# Patient Record
Sex: Female | Born: 1980
Health system: Southern US, Community
[De-identification: ages and names within clinical notes are randomized; demographics above are authoritative.]

## PROBLEM LIST (undated history)

## (undated) ENCOUNTER — Ambulatory Visit: Discharge status: Absent without leave

## (undated) DIAGNOSIS — J45909 Unspecified asthma, uncomplicated: Secondary | ICD-10-CM

## (undated) DIAGNOSIS — K219 Gastro-esophageal reflux disease without esophagitis: Secondary | ICD-10-CM

## (undated) DIAGNOSIS — E119 Type 2 diabetes mellitus without complications: Secondary | ICD-10-CM

## (undated) DIAGNOSIS — O039 Complete or unspecified spontaneous abortion without complication: Secondary | ICD-10-CM

## (undated) DIAGNOSIS — I10 Essential (primary) hypertension: Secondary | ICD-10-CM

## (undated) DIAGNOSIS — F4310 Post-traumatic stress disorder, unspecified: Secondary | ICD-10-CM

## (undated) DIAGNOSIS — R000 Tachycardia, unspecified: Secondary | ICD-10-CM

## (undated) DIAGNOSIS — R440 Auditory hallucinations: Secondary | ICD-10-CM

## (undated) DIAGNOSIS — E785 Hyperlipidemia, unspecified: Secondary | ICD-10-CM

## (undated) DIAGNOSIS — R0600 Dyspnea, unspecified: Secondary | ICD-10-CM

## (undated) DIAGNOSIS — F329 Major depressive disorder, single episode, unspecified: Secondary | ICD-10-CM

## (undated) HISTORY — DX: Essential (primary) hypertension: I10

## (undated) HISTORY — PX: OTHER SURGICAL HISTORY: SHX169

## (undated) HISTORY — PX: TONSILLECTOMY: SUR1361

## (undated) HISTORY — DX: Major depressive disorder, single episode, unspecified: F32.9

## (undated) HISTORY — PX: ABDOMINAL HYSTERECTOMY: SHX81

## (undated) HISTORY — PX: OVARY SURGERY: SHX727

## (undated) HISTORY — DX: Post-traumatic stress disorder, unspecified: F43.10

## (undated) HISTORY — PX: ADENOIDECTOMY: SUR15

## (undated) HISTORY — DX: Complete or unspecified spontaneous abortion without complication: O03.9

## (undated) HISTORY — DX: Type 2 diabetes mellitus without complications: E11.9

---

## 1999-02-19 ENCOUNTER — Emergency Department (HOSPITAL_COMMUNITY): Admission: EM | Admit: 1999-02-19 | Discharge: 1999-02-19 | Payer: Self-pay | Admitting: Internal Medicine

## 1999-07-08 ENCOUNTER — Emergency Department (HOSPITAL_COMMUNITY): Admission: EM | Admit: 1999-07-08 | Discharge: 1999-07-08 | Payer: Self-pay | Admitting: Emergency Medicine

## 1999-07-08 ENCOUNTER — Encounter: Payer: Self-pay | Admitting: Emergency Medicine

## 1999-08-22 ENCOUNTER — Emergency Department (HOSPITAL_COMMUNITY): Admission: EM | Admit: 1999-08-22 | Discharge: 1999-08-22 | Payer: Self-pay | Admitting: Emergency Medicine

## 1999-11-25 ENCOUNTER — Emergency Department (HOSPITAL_COMMUNITY): Admission: EM | Admit: 1999-11-25 | Discharge: 1999-11-25 | Payer: Self-pay | Admitting: Emergency Medicine

## 1999-12-22 ENCOUNTER — Encounter: Payer: Self-pay | Admitting: Emergency Medicine

## 1999-12-22 ENCOUNTER — Emergency Department (HOSPITAL_COMMUNITY): Admission: EM | Admit: 1999-12-22 | Discharge: 1999-12-22 | Payer: Self-pay | Admitting: Emergency Medicine

## 2000-06-01 ENCOUNTER — Emergency Department (HOSPITAL_COMMUNITY): Admission: EM | Admit: 2000-06-01 | Discharge: 2000-06-01 | Payer: Self-pay | Admitting: Emergency Medicine

## 2001-02-07 ENCOUNTER — Emergency Department (HOSPITAL_COMMUNITY): Admission: EM | Admit: 2001-02-07 | Discharge: 2001-02-07 | Payer: Self-pay | Admitting: Emergency Medicine

## 2003-08-28 ENCOUNTER — Emergency Department (HOSPITAL_COMMUNITY): Admission: EM | Admit: 2003-08-28 | Discharge: 2003-08-28 | Payer: Self-pay | Admitting: *Deleted

## 2003-09-02 ENCOUNTER — Other Ambulatory Visit: Admission: RE | Admit: 2003-09-02 | Discharge: 2003-09-02 | Payer: Self-pay | Admitting: *Deleted

## 2003-10-09 ENCOUNTER — Emergency Department (HOSPITAL_COMMUNITY): Admission: EM | Admit: 2003-10-09 | Discharge: 2003-10-09 | Payer: Self-pay | Admitting: Emergency Medicine

## 2003-10-23 ENCOUNTER — Emergency Department (HOSPITAL_COMMUNITY): Admission: EM | Admit: 2003-10-23 | Discharge: 2003-10-23 | Payer: Self-pay

## 2004-03-01 ENCOUNTER — Emergency Department (HOSPITAL_COMMUNITY): Admission: EM | Admit: 2004-03-01 | Discharge: 2004-03-01 | Payer: Self-pay | Admitting: Emergency Medicine

## 2004-03-07 ENCOUNTER — Ambulatory Visit (HOSPITAL_COMMUNITY): Admission: RE | Admit: 2004-03-07 | Discharge: 2004-03-07 | Payer: Self-pay | Admitting: *Deleted

## 2004-03-07 ENCOUNTER — Encounter (INDEPENDENT_AMBULATORY_CARE_PROVIDER_SITE_OTHER): Payer: Self-pay | Admitting: *Deleted

## 2004-03-08 ENCOUNTER — Encounter (INDEPENDENT_AMBULATORY_CARE_PROVIDER_SITE_OTHER): Payer: Self-pay | Admitting: *Deleted

## 2004-03-18 ENCOUNTER — Inpatient Hospital Stay (HOSPITAL_COMMUNITY): Admission: AD | Admit: 2004-03-18 | Discharge: 2004-03-18 | Payer: Self-pay | Admitting: Obstetrics and Gynecology

## 2004-03-28 ENCOUNTER — Emergency Department (HOSPITAL_COMMUNITY): Admission: EM | Admit: 2004-03-28 | Discharge: 2004-03-28 | Payer: Self-pay | Admitting: Emergency Medicine

## 2004-05-08 ENCOUNTER — Emergency Department (HOSPITAL_COMMUNITY): Admission: EM | Admit: 2004-05-08 | Discharge: 2004-05-08 | Payer: Self-pay | Admitting: Emergency Medicine

## 2004-06-26 ENCOUNTER — Ambulatory Visit (HOSPITAL_COMMUNITY): Admission: RE | Admit: 2004-06-26 | Discharge: 2004-06-26 | Payer: Self-pay | Admitting: *Deleted

## 2004-08-21 ENCOUNTER — Emergency Department (HOSPITAL_COMMUNITY): Admission: EM | Admit: 2004-08-21 | Discharge: 2004-08-21 | Payer: Self-pay | Admitting: Emergency Medicine

## 2004-08-23 ENCOUNTER — Other Ambulatory Visit: Admission: RE | Admit: 2004-08-23 | Discharge: 2004-08-23 | Payer: Self-pay | Admitting: Obstetrics and Gynecology

## 2004-09-16 ENCOUNTER — Emergency Department (HOSPITAL_COMMUNITY): Admission: EM | Admit: 2004-09-16 | Discharge: 2004-09-16 | Payer: Self-pay | Admitting: Emergency Medicine

## 2005-02-14 ENCOUNTER — Emergency Department (HOSPITAL_COMMUNITY): Admission: EM | Admit: 2005-02-14 | Discharge: 2005-02-14 | Payer: Self-pay | Admitting: *Deleted

## 2005-02-20 ENCOUNTER — Emergency Department (HOSPITAL_COMMUNITY): Admission: EM | Admit: 2005-02-20 | Discharge: 2005-02-20 | Payer: Self-pay | Admitting: Emergency Medicine

## 2005-03-03 ENCOUNTER — Emergency Department (HOSPITAL_COMMUNITY): Admission: EM | Admit: 2005-03-03 | Discharge: 2005-03-04 | Payer: Self-pay | Admitting: *Deleted

## 2005-08-07 ENCOUNTER — Emergency Department (HOSPITAL_COMMUNITY): Admission: EM | Admit: 2005-08-07 | Discharge: 2005-08-07 | Payer: Self-pay | Admitting: Emergency Medicine

## 2005-09-06 ENCOUNTER — Emergency Department (HOSPITAL_COMMUNITY): Admission: EM | Admit: 2005-09-06 | Discharge: 2005-09-06 | Payer: Self-pay | Admitting: Emergency Medicine

## 2005-10-31 ENCOUNTER — Emergency Department (HOSPITAL_COMMUNITY): Admission: EM | Admit: 2005-10-31 | Discharge: 2005-10-31 | Payer: Self-pay | Admitting: Emergency Medicine

## 2005-11-15 ENCOUNTER — Emergency Department (HOSPITAL_COMMUNITY): Admission: EM | Admit: 2005-11-15 | Discharge: 2005-11-15 | Payer: Self-pay | Admitting: Emergency Medicine

## 2006-04-02 ENCOUNTER — Emergency Department (HOSPITAL_COMMUNITY): Admission: EM | Admit: 2006-04-02 | Discharge: 2006-04-02 | Payer: Self-pay | Admitting: Emergency Medicine

## 2006-06-29 ENCOUNTER — Emergency Department (HOSPITAL_COMMUNITY): Admission: EM | Admit: 2006-06-29 | Discharge: 2006-06-29 | Payer: Self-pay | Admitting: Family Medicine

## 2007-03-05 ENCOUNTER — Emergency Department (HOSPITAL_COMMUNITY): Admission: EM | Admit: 2007-03-05 | Discharge: 2007-03-05 | Payer: Self-pay | Admitting: Emergency Medicine

## 2007-08-06 HISTORY — PX: CHOLECYSTECTOMY: SHX55

## 2007-11-30 ENCOUNTER — Ambulatory Visit (HOSPITAL_COMMUNITY): Admission: RE | Admit: 2007-11-30 | Discharge: 2007-11-30 | Payer: Self-pay | Admitting: Family Medicine

## 2008-01-27 ENCOUNTER — Emergency Department (HOSPITAL_COMMUNITY): Admission: EM | Admit: 2008-01-27 | Discharge: 2008-01-27 | Payer: Self-pay | Admitting: Family Medicine

## 2008-04-25 ENCOUNTER — Emergency Department (HOSPITAL_COMMUNITY): Admission: EM | Admit: 2008-04-25 | Discharge: 2008-04-25 | Payer: Self-pay | Admitting: Family Medicine

## 2010-08-25 ENCOUNTER — Encounter: Payer: Self-pay | Admitting: *Deleted

## 2010-12-21 NOTE — Op Note (Signed)
NAME:  KARLEE, STAFF                        ACCOUNT NO.:  [id number]   MEDICAL RECORD NO.:  [id number]                   PATIENT TYPE:  AMB   LOCATION:  SDC                                  FACILITY:  [facility]   PHYSICIAN:  [place] B. Earlene Plater, M.D.               DATE OF BIRTH:  11/08/1980   DATE OF PROCEDURE:  03/07/2004  DATE OF DISCHARGE:                                 OPERATIVE REPORT   PREOPERATIVE DIAGNOSIS:  Right lower quadrant pain, right ovarian cyst.   POSTOPERATIVE DIAGNOSIS:  Right lower quadrant pain, right ovarian cyst.   OPERATION PERFORMED:  Open laparoscopy, aspiration of right ovarian cyst and  right ovarian cystectomy.   SURGEON:  Chester Holstein. Earlene Plater, M.D.   ANESTHESIA:  General.   FINDINGS:  Simple appearing right ovarian cyst, normal-appearing uterus,  tubes, left ovary, gallbladder, liver edge, hemidiaphragms.  Appendix poorly  visualized.   ESTIMATED BLOOD LOSS:  50 mL.   COMPLICATIONS:  None.   SPECIMENS:  Fluid from cyst and right ovarian cyst wall.   INDICATIONS FOR PROCEDURE:  Patient with a history of right lower quadrant  pain and persistent simple-appearing right ovarian cyst over the last  several months.  Requesting surgical treatment due to the pain associated  with it.   DESCRIPTION OF PROCEDURE:  The patient was advised of the risks of surgery  including infection, bleeding, damage to bowel, bladder and surrounding  organs, prior to surgery.  She was taken to the operating room and general  anesthesia obtained.  She was prepped and draped in standard fashion and  Foley catheter inserted into the bladder.  Speculum inserted and a Hulka  tenaculum attached to the anterior lip of the cervix.   A 10 mm vertical infraumbilical incision made with a knife, carried sharply  to the fascia.  The fascia was divided sharply and elevated with Kocher  clamps.  Posterior sheath and peritoneum were elevated with long Allis  clamps and divided sharply with the  knife.  Intra-abdominal entry confirmed.  Pursestring suture of 0 Vicryl placed around the fascial defect.  Hasson  cannula inserted and secure.  Pneumoperitoneum obtained with CO2 gas.  The  operative scope was inserted and intra-abdominal placement confirmed.  The  patient was placed in Trendelenburg position and a 5 mm port attempted to be  placed in the left lower quadrant although due to the patient's morbid  obesity it was difficult to ascertain the exact point of entry into the  abdomen.  Therefore, prior to entering the peritoneal cavity, the attempt in  the left lower quadrant was abandoned and I made incision in the midline 2  cm above the symphysis and entered directly under laparoscopic visualization  with a 5 mm port.  The bowel was mobilized superiorly with a blunt probe.  The abdomen and pelvis were inspected with the above findings noted.   The right ovary was inspected and  appeared to be containing a benign ovarian  cyst.  It was difficult to grasp the cyst and ovary due to the tenseness  from the fluid.  Therefore, the cyst was aspirated which made mobilization  of the cyst wall much easier.  It was then excised sharply and removed.  The  bed was cauterized with bipolar cautery and was hemostatic.  It was  irrigated.  No active bleeding noted. The abdomen and pelvis were again  inspected.  No other issues identified.  Therefore the case was terminated.  The inferior port was removed and the site inspected with the laparoscope.  It was hemostatic. The abdominal wall was again inspected with a laparoscope  throughout and no other issues identified.  The scope was removed and the  gas released.  The Hasson cannula was removed.  I inserted my index finger  through the fascial defect, snugged down the pursestring suture.  This  obliterated the fascial defect and no intra-abdominal contents herniated  through prior to closure.  Subcutaneous tissue was reapproximated with a   single stitch of 0 Vicryl.  The skin was closed with a subcuticular 4-0  Vicryl at the umbilicus and at each of the lower port incisions. The  tenaculum was removed and the cervix was hemostatic.  The patient tolerated  the procedure well.  There were no complications.  She was taken to the  recovery room awake, alert and instable condition.                                               Gerri Spore B. Earlene Plater, M.D.    WBD/MEDQ  D:  03/07/2004  T:  03/07/2004  Job:  [id number]

## 2011-05-27 ENCOUNTER — Inpatient Hospital Stay (INDEPENDENT_AMBULATORY_CARE_PROVIDER_SITE_OTHER)
Admission: RE | Admit: 2011-05-27 | Discharge: 2011-05-27 | Disposition: A | Discharge status: Home / Self Care | Payer: Self-pay | Source: Ambulatory Visit | Attending: Emergency Medicine | Admitting: Emergency Medicine

## 2011-05-27 DIAGNOSIS — J040 Acute laryngitis: Secondary | ICD-10-CM

## 2011-05-27 DIAGNOSIS — J069 Acute upper respiratory infection, unspecified: Secondary | ICD-10-CM

## 2013-01-26 ENCOUNTER — Ambulatory Visit: Payer: PRIVATE HEALTH INSURANCE | Admitting: Physical Therapy

## 2013-01-26 DIAGNOSIS — R609 Edema, unspecified: Secondary | ICD-10-CM

## 2013-01-26 DIAGNOSIS — M25676 Stiffness of unspecified foot, not elsewhere classified: Secondary | ICD-10-CM

## 2013-01-26 DIAGNOSIS — R269 Unspecified abnormalities of gait and mobility: Secondary | ICD-10-CM

## 2013-01-26 DIAGNOSIS — M25673 Stiffness of unspecified ankle, not elsewhere classified: Secondary | ICD-10-CM

## 2013-01-26 DIAGNOSIS — M722 Plantar fascial fibromatosis: Secondary | ICD-10-CM

## 2013-01-26 DIAGNOSIS — M25579 Pain in unspecified ankle and joints of unspecified foot: Secondary | ICD-10-CM

## 2013-01-29 ENCOUNTER — Ambulatory Visit: Payer: PRIVATE HEALTH INSURANCE | Attending: Physical Therapy | Admitting: Physical Therapy

## 2014-04-26 ENCOUNTER — Emergency Department (INDEPENDENT_AMBULATORY_CARE_PROVIDER_SITE_OTHER)
Admission: EM | Admit: 2014-04-26 | Discharge: 2014-04-26 | Disposition: A | Discharge status: Home / Self Care | Payer: Self-pay | Source: Home / Self Care | Attending: Family Medicine | Admitting: Family Medicine

## 2014-04-26 ENCOUNTER — Encounter (HOSPITAL_COMMUNITY): Payer: Self-pay | Admitting: Emergency Medicine

## 2014-04-26 DIAGNOSIS — J029 Acute pharyngitis, unspecified: Secondary | ICD-10-CM

## 2014-04-26 LAB — POCT RAPID STREP A: Streptococcus, Group A Screen (Direct): NEGATIVE

## 2014-04-26 MED ORDER — IPRATROPIUM BROMIDE 0.06 % NA SOLN: 2.0000 | Freq: Four times a day (QID) | NASAL | Status: DC

## 2014-04-26 MED ORDER — PREDNISONE 10 MG PO TABS: 30.0000 mg | ORAL_TABLET | Freq: Every day | ORAL | Status: DC

## 2014-04-26 NOTE — ED Notes (Signed)
Patient c/o sore throat x 2 days. Pain is mostly on the left side. Patient reports ears are also full and painful. Patient reports she has had headache. Denies fever or chills. Patient reports she has taken Tylenol for pain. Patient is alert and oriented and in NAD.

## 2014-04-26 NOTE — Discharge Instructions (Signed)
Thank you for coming in today. Take prednisone daily for 5 days. Use Atrovent nasal spray as needed. Use Tylenol as needed. Call or go to the emergency room if you get worse, have trouble breathing, have chest pains, or palpitations.   Sinusitis Sinusitis is redness, soreness, and inflammation of the paranasal sinuses. Paranasal sinuses are air pockets within the bones of your face (beneath the eyes, the middle of the forehead, or above the eyes). In healthy paranasal sinuses, mucus is able to drain out, and air is able to circulate through them by way of your nose. However, when your paranasal sinuses are inflamed, mucus and air can become trapped. This can allow bacteria and other germs to grow and cause infection. Sinusitis can develop quickly and last only a short time (acute) or continue over a long period (chronic). Sinusitis that lasts for more than 12 weeks is considered chronic.  CAUSES  Causes of sinusitis include:  Allergies.  Structural abnormalities, such as displacement of the cartilage that separates your nostrils (deviated septum), which can decrease the air flow through your nose and sinuses and affect sinus drainage.  Functional abnormalities, such as when the small hairs (cilia) that line your sinuses and help remove mucus do not work properly or are not present. SIGNS AND SYMPTOMS  Symptoms of acute and chronic sinusitis are the same. The primary symptoms are pain and pressure around the affected sinuses. Other symptoms include:  Upper toothache.  Earache.  Headache.  Bad breath.  Decreased sense of smell and taste.  A cough, which worsens when you are lying flat.  Fatigue.  Fever.  Thick drainage from your nose, which often is green and may contain pus (purulent).  Swelling and warmth over the affected sinuses. DIAGNOSIS  Your health care provider will perform a physical exam. During the exam, your health care provider may:  Look in your nose for signs  of abnormal growths in your nostrils (nasal polyps).  Tap over the affected sinus to check for signs of infection.  View the inside of your sinuses (endoscopy) using an imaging device that has a light attached (endoscope). If your health care provider suspects that you have chronic sinusitis, one or more of the following tests may be recommended:  Allergy tests.  Nasal culture. A sample of mucus is taken from your nose, sent to a lab, and screened for bacteria.  Nasal cytology. A sample of mucus is taken from your nose and examined by your health care provider to determine if your sinusitis is related to an allergy. TREATMENT  Most cases of acute sinusitis are related to a viral infection and will resolve on their own within 10 days. Sometimes medicines are prescribed to help relieve symptoms (pain medicine, decongestants, nasal steroid sprays, or saline sprays).  However, for sinusitis related to a bacterial infection, your health care provider will prescribe antibiotic medicines. These are medicines that will help kill the bacteria causing the infection.  Rarely, sinusitis is caused by a fungal infection. In theses cases, your health care provider will prescribe antifungal medicine. For some cases of chronic sinusitis, surgery is needed. Generally, these are cases in which sinusitis recurs more than 3 times per year, despite other treatments. HOME CARE INSTRUCTIONS   Drink plenty of water. Water helps thin the mucus so your sinuses can drain more easily.  Use a humidifier.  Inhale steam 3 to 4 times a day (for example, sit in the bathroom with the shower running).  Apply a warm,  moist washcloth to your face 3 to 4 times a day, or as directed by your health care provider.  Use saline nasal sprays to help moisten and clean your sinuses.  Take medicines only as directed by your health care provider.  If you were prescribed either an antibiotic or antifungal medicine, finish it all even  if you start to feel better. SEEK IMMEDIATE MEDICAL CARE IF:  You have increasing pain or severe headaches.  You have nausea, vomiting, or drowsiness.  You have swelling around your face.  You have vision problems.  You have a stiff neck.  You have difficulty breathing. MAKE SURE YOU:   Understand these instructions.  Will watch your condition.  Will get help right away if you are not doing well or get worse. Document Released: 07/22/2005 Document Revised: 12/06/2013 Document Reviewed: 08/06/2011 [facility] Patient Information 2015 [place], [place]. This information is not intended to replace advice given to you by your health care provider. Make sure you discuss any questions you have with your health care provider.

## 2014-04-26 NOTE — ED Provider Notes (Signed)
Joan [name] is a 33 y.o. female who presents to Urgent Care today for sore throat cough and congestion. Symptoms present for 2 days. No nausea vomiting or diarrhea. Patient has tried Tylenol which helps. No shortness of breath.   History reviewed. No pertinent past medical history. History  Substance Use Topics  . Smoking status: Never Smoker   . Smokeless tobacco: Not on file  . Alcohol Use: No   ROS as above Medications: No current facility-administered medications for this encounter.   Current Outpatient Prescriptions  Medication Sig Dispense Refill  . ipratropium (ATROVENT) 0.06 % nasal spray Place 2 sprays into both nostrils 4 (four) times daily.  15 mL  1  . predniSONE (DELTASONE) 10 MG tablet Take 3 tablets (30 mg total) by mouth daily.  15 tablet  0    Exam:  BP 152/94  Pulse 93  Temp(Src) 98.1 F (36.7 C) (Oral)  Resp 16  SpO2 100%  LMP 04/22/2014 Gen: Well NAD HEENT: EOMI,  MMM posterior pharynx with cobblestoning. Normal tympanic membranes bilaterally. Maxillary and frontal sinuses are nontender Lungs: Normal work of breathing. CTABL Heart: RRR no MRG Abd: NABS, Soft. Nondistended, Nontender Exts: Brisk capillary refill, warm and well perfused.   Results for orders placed during the hospital encounter of 04/26/14 (from the past 24 hour(s))  POCT RAPID STREP A ([facility] ONLY)     Status: None   Collection Time    04/26/14  2:24 PM      Result Value Ref Range   Streptococcus, Group A Screen (Direct) NEGATIVE  NEGATIVE   No results found.  Assessment and Plan: 33 y.o. female with viral sinusitis or pharyngitis. Patient is quite symptomatic. Discussed options. Plan to treat with prednisone and Atrovent nasal spray.  Discussed warning signs or symptoms. Please see discharge instructions. Patient expresses understanding.     Rodolph Bong, MD 04/26/14 1450

## 2014-04-28 LAB — CULTURE, GROUP A STREP

## 2014-06-15 ENCOUNTER — Emergency Department (HOSPITAL_COMMUNITY)
Admission: EM | Admit: 2014-06-15 | Discharge: 2014-06-15 | Disposition: A | Discharge status: Home / Self Care | Payer: Self-pay | Attending: Emergency Medicine | Admitting: Emergency Medicine

## 2014-06-15 ENCOUNTER — Encounter (HOSPITAL_COMMUNITY): Payer: Self-pay | Admitting: Emergency Medicine

## 2014-06-15 DIAGNOSIS — S8391XA Sprain of unspecified site of right knee, initial encounter: Secondary | ICD-10-CM | POA: Insufficient documentation

## 2014-06-15 DIAGNOSIS — Z79899 Other long term (current) drug therapy: Secondary | ICD-10-CM | POA: Insufficient documentation

## 2014-06-15 DIAGNOSIS — S86911A Strain of unspecified muscle(s) and tendon(s) at lower leg level, right leg, initial encounter: Secondary | ICD-10-CM | POA: Insufficient documentation

## 2014-06-15 DIAGNOSIS — Z7952 Long term (current) use of systemic steroids: Secondary | ICD-10-CM | POA: Insufficient documentation

## 2014-06-15 DIAGNOSIS — X58XXXA Exposure to other specified factors, initial encounter: Secondary | ICD-10-CM | POA: Insufficient documentation

## 2014-06-15 DIAGNOSIS — M79609 Pain in unspecified limb: Secondary | ICD-10-CM

## 2014-06-15 DIAGNOSIS — Y990 Civilian activity done for income or pay: Secondary | ICD-10-CM | POA: Insufficient documentation

## 2014-06-15 DIAGNOSIS — Z880 Allergy status to penicillin: Secondary | ICD-10-CM | POA: Insufficient documentation

## 2014-06-15 DIAGNOSIS — Y9289 Other specified places as the place of occurrence of the external cause: Secondary | ICD-10-CM | POA: Insufficient documentation

## 2014-06-15 DIAGNOSIS — Y9339 Activity, other involving climbing, rappelling and jumping off: Secondary | ICD-10-CM | POA: Insufficient documentation

## 2014-06-15 NOTE — Progress Notes (Signed)
  CARE MANAGEMENT ED NOTE 06/15/2014  [facility]  [facility]   Account Number:  [id number]  Date Initiated:  06/15/2014  Documentation initiated by:  Radford PaxFERRERO,Curly Mackowski  Subjective/Objective Assessment:   Patient presents to Ed with right leg swelling     Subjective/Objective Assessment Detail:     Action/Plan:   Action/Plan Detail:   Anticipated DC Date:  06/15/2014     Status Recommendation to Physician:   Result of Recommendation:    Other ED Services  Consult Working Plan    DC Planning Services  Other  PCP issues    Choice offered to / List presented to:            Status of service:  Completed, signed off  ED Comments:   ED Comments Detail:  EDCM spoke to patient at bedside. Patient confirms she [place] have a pcp [place]. EDCM [facility], informed patient of services there and walk in times.  EDCM also provided patient with list of pcps who accept self pay patients, list of discount pharmacies and websites needymeds.org and GoodRX.com for medication assistance, phone number to inquire about the orange card, phone number to inquire about Mediciad, phone number to inquire about the Affordable Care Act, financial resources in the community such as local churches, salvation [employer], urban ministries, and dental assistance for uninsured patients. Patient reports she will be receiving insurnace through her job in January.  Patient thankfulf or resources.  No further EDCM needs at this time.   .Marland Kitchen

## 2014-06-15 NOTE — ED Notes (Signed)
Pt sent by doctor for rt leg swelling.  R/O DVT.

## 2014-06-15 NOTE — Progress Notes (Signed)
*  PRELIMINARY RESULTS* Vascular Ultrasound Right lower extremity venous duplex has been completed.  Preliminary findings: no evidence of DVT or baker's cyst.  Farrel DemarkJill Eunice, RDMS, RVT  06/15/2014, 4:15 PM

## 2014-06-15 NOTE — Discharge Instructions (Signed)

## 2014-06-15 NOTE — ED Provider Notes (Signed)
CSN: [id number]     Arrival date & time 06/15/14  1438 History   First MD Initiated Contact with Patient 06/15/14 1505     Chief Complaint  Patient presents with  . Leg Pain     (Consider location/radiation/quality/duration/timing/severity/associated sxs/prior Treatment) Patient is a 33 y.o. female presenting with leg pain. The history is provided by the patient.  Leg Pain patient has pain from behind her right knee down to her foot. States that she was jumping on a box while working out developed acute pain up and down the leg. States her trainer thinks it is just a strain but she went to a doctor today who said she needs to go emergently to the ER to rule out a blood clot. No history of blood clots. She is not on hormonal treatment and does not smoke. She has had 2 family members die of blood clots, but she states they were diabetic and not mobile. She does not know if they had a hypercoagulable workup. No chest pain. No trouble breathing. She states that she recently ran a half marathon and has had some pain in her feet since.  History reviewed. No pertinent past medical history. Past Surgical History  Procedure Laterality Date  . Tonsillectomy     No family history on file. History  Substance Use Topics  . Smoking status: Never Smoker   . Smokeless tobacco: Not on file  . Alcohol Use: No   OB History    No data available     Review of Systems  Respiratory: Negative for chest tightness and shortness of breath.   Cardiovascular: Negative for chest pain.  Genitourinary: Negative for flank pain.  Musculoskeletal: Negative for neck stiffness.       Right knee pain  Skin: Negative for rash and wound.  Neurological: Negative for weakness and numbness.      Allergies  Ciprofloxacin; Ibuprofen; Penicillins; and Sulfa antibiotics  Home Medications   Prior to Admission medications   Medication Sig Start Date End Date Taking? Authorizing Provider  acetaminophen (TYLENOL) 500  MG tablet Take 1,000 mg by mouth every 6 (six) hours as needed for moderate pain (knee pain).   Yes Historical Provider, MD  Camphor-Eucalyptus-Menthol (VICKS VAPORUB EX) Apply 1 application topically 2 (two) times daily as needed (chest congestion).   Yes Historical Provider, MD  sodium chloride (OCEAN) 0.65 % SOLN nasal spray Place 2 sprays into both nostrils as needed for congestion (nasal congestion).   Yes Historical Provider, MD  ipratropium (ATROVENT) 0.06 % nasal spray Place 2 sprays into both nostrils 4 (four) times daily. 04/26/14   Rodolph BongEvan S Corey, MD  predniSONE (DELTASONE) 10 MG tablet Take 3 tablets (30 mg total) by mouth daily. 04/26/14   Rodolph BongEvan S Corey, MD   BP 175/96 mmHg  Pulse 92  Temp(Src) 98.3 F (36.8 C) (Oral)  Resp 16  SpO2 100% Physical Exam  Constitutional: She appears well-developed.  HENT:  Head: Normocephalic.  Cardiovascular: Normal rate and regular rhythm.   Musculoskeletal: She exhibits tenderness.  Tenderness over medial quadriceps tendons on right knee. No peripheral edema. No distal edema. No knee effusion.    ED Course  Procedures (including critical care time) Labs Review Labs Reviewed - No data to display  Imaging Review No results found.   EKG Interpretation None      MDM   Final diagnoses:  None    Patient with pain behind her knee. Likely tendon. Negative Doppler will discharge home doubt fracture  [name] RudeNathan R. Rubin PayorPickering, MD 06/15/14 2350

## 2014-10-06 ENCOUNTER — Other Ambulatory Visit: Payer: Self-pay | Admitting: Orthopaedic Surgery

## 2014-10-06 DIAGNOSIS — M79671 Pain in right foot: Secondary | ICD-10-CM

## 2014-10-17 ENCOUNTER — Ambulatory Visit
Admission: RE | Admit: 2014-10-17 | Discharge: 2014-10-17 | Disposition: A | Discharge status: Home / Self Care | Payer: Self-pay | Source: Ambulatory Visit | Attending: Orthopaedic Surgery | Admitting: Orthopaedic Surgery

## 2014-10-17 DIAGNOSIS — M79671 Pain in right foot: Secondary | ICD-10-CM

## 2015-02-21 ENCOUNTER — Emergency Department (HOSPITAL_COMMUNITY): Payer: BLUE CROSS/BLUE SHIELD

## 2015-02-21 ENCOUNTER — Emergency Department (HOSPITAL_COMMUNITY)
Admission: EM | Admit: 2015-02-21 | Discharge: 2015-02-21 | Disposition: A | Discharge status: Home / Self Care | Payer: BLUE CROSS/BLUE SHIELD | Attending: Emergency Medicine | Admitting: Emergency Medicine

## 2015-02-21 ENCOUNTER — Encounter (HOSPITAL_COMMUNITY): Payer: Self-pay | Admitting: Emergency Medicine

## 2015-02-21 DIAGNOSIS — I880 Nonspecific mesenteric lymphadenitis: Secondary | ICD-10-CM | POA: Diagnosis not present

## 2015-02-21 DIAGNOSIS — Z79899 Other long term (current) drug therapy: Secondary | ICD-10-CM | POA: Diagnosis not present

## 2015-02-21 DIAGNOSIS — R109 Unspecified abdominal pain: Secondary | ICD-10-CM

## 2015-02-21 DIAGNOSIS — Z3202 Encounter for pregnancy test, result negative: Secondary | ICD-10-CM | POA: Diagnosis not present

## 2015-02-21 DIAGNOSIS — Z880 Allergy status to penicillin: Secondary | ICD-10-CM | POA: Insufficient documentation

## 2015-02-21 DIAGNOSIS — R1031 Right lower quadrant pain: Secondary | ICD-10-CM | POA: Diagnosis present

## 2015-02-21 HISTORY — DX: Type 2 diabetes mellitus without complications: E11.9

## 2015-02-21 LAB — BASIC METABOLIC PANEL
Anion gap: 5 (ref 5–15)
BUN: 13 mg/dL (ref 6–20)
CO2: 26 mmol/L (ref 22–32)
Calcium: 9.2 mg/dL (ref 8.9–10.3)
Chloride: 108 mmol/L (ref 101–111)
Creatinine, Ser: 0.74 mg/dL (ref 0.44–1.00)
GFR calc Af Amer: >60 mL/min (ref >60)
GFR calc non Af Amer: >60 mL/min (ref >60)
Glucose, Bld: 119 mg/dL — ABNORMAL HIGH (ref 65–99)
Potassium: 4 mmol/L (ref 3.5–5.1)
Sodium: 139 mmol/L (ref 135–145)

## 2015-02-21 LAB — CBC WITH DIFFERENTIAL/PLATELET
Basophils Absolute: 0 10*3/uL (ref 0.0–0.1)
Basophils Relative: 0 % (ref 0–1)
Eosinophils Absolute: 0.1 10*3/uL (ref 0.0–0.7)
Eosinophils Relative: 1 % (ref 0–5)
HCT: 41.3 % (ref 36.0–46.0)
Hemoglobin: 13.5 g/dL (ref 12.0–15.0)
Lymphocytes Relative: 32 % (ref 12–46)
Lymphs Abs: 1.5 10*3/uL (ref 0.7–4.0)
MCH: 28.4 pg (ref 26.0–34.0)
MCHC: 32.7 g/dL (ref 30.0–36.0)
MCV: 86.9 fL (ref 78.0–100.0)
Monocytes Absolute: 0.5 10*3/uL (ref 0.1–1.0)
Monocytes Relative: 11 % (ref 3–12)
Neutro Abs: 2.7 10*3/uL (ref 1.7–7.7)
Neutrophils Relative %: 56 % (ref 43–77)
Platelets: 262 10*3/uL (ref 150–400)
RBC: 4.75 MIL/uL (ref 3.87–5.11)
RDW: 12.9 % (ref 11.5–15.5)
WBC: 4.8 10*3/uL (ref 4.0–10.5)

## 2015-02-21 LAB — I-STAT BETA HCG BLOOD, ED (MC, WL, AP ONLY): I-stat hCG, quantitative: <5 m[IU]/mL (ref <5)

## 2015-02-21 MED ORDER — SODIUM CHLORIDE 0.9 % IV SOLN: INTRAVENOUS | Status: DC

## 2015-02-21 MED ORDER — IOHEXOL 300 MG/ML  SOLN
100.0000 mL | Freq: Once | INTRAMUSCULAR | Status: AC | PRN
  Administered 2015-02-21: 100 mL via INTRAVENOUS

## 2015-02-21 MED ORDER — IOHEXOL 300 MG/ML  SOLN
50.0000 mL | Freq: Once | INTRAMUSCULAR | Status: AC | PRN
  Administered 2015-02-21: 50 mL via ORAL

## 2015-02-21 MED ORDER — OXYCODONE-ACETAMINOPHEN 5-325 MG PO TABS: 1.0000 | ORAL_TABLET | ORAL | Status: DC | PRN

## 2015-02-21 MED ORDER — HYDROMORPHONE HCL 1 MG/ML IJ SOLN
1.0000 mg | Freq: Once | INTRAMUSCULAR | Status: AC
  Administered 2015-02-21: 1 mg via INTRAVENOUS
  Filled 2015-02-21: qty 1

## 2015-02-21 MED ORDER — ONDANSETRON HCL 4 MG/2ML IJ SOLN
4.0000 mg | Freq: Once | INTRAMUSCULAR | Status: AC
  Administered 2015-02-21: 4 mg via INTRAVENOUS
  Filled 2015-02-21: qty 2

## 2015-02-21 MED ORDER — SODIUM CHLORIDE 0.9 % IV BOLUS (SEPSIS)
1000.0000 mL | Freq: Once | INTRAVENOUS | Status: AC
  Administered 2015-02-21: 1000 mL via INTRAVENOUS

## 2015-02-21 NOTE — ED Notes (Signed)
Per pt, states right lower quadrant pain since Sat.-saw PCP and thought it might be appendix-told to come to ED if pain got worse

## 2015-02-21 NOTE — Discharge Instructions (Signed)
Mesenteric Adenitis Mesenteric adenitis is an inflammation of lymph nodes (glands) in the abdomen. It may appear to mimic appendicitis symptoms. . The cause of this may be an infection somewhere else in the body. It usually gets well without treatment but can cause problems for up to a couple weeks. SYMPTOMS  The most common problems are:  Fever. Abdominal Pain, Women Abdominal (stomach, pelvic, or belly) pain can be caused by many things. It is important to tell your doctor: The location of the pain. Does it come and go or is it present all the time? Are there things that start the pain (eating certain foods, exercise)? Are there other symptoms associated with the pain (fever, nausea, vomiting, diarrhea)? All of this is helpful to know when trying to find the cause of the pain. CAUSES  Stomach: virus or bacteria infection, or ulcer. Intestine: appendicitis (inflamed appendix), regional ileitis (Crohn's disease), ulcerative colitis (inflamed colon), irritable bowel syndrome, diverticulitis (inflamed diverticulum of the colon), or cancer of the stomach or intestine. Gallbladder disease or stones in the gallbladder. Kidney disease, kidney stones, or infection. Pancreas infection or cancer. Fibromyalgia (pain disorder). Diseases of the female organs: Uterus: fibroid (non-cancerous) tumors or infection. Fallopian tubes: infection or tubal pregnancy. Ovary: cysts or tumors. Pelvic adhesions (scar tissue). Endometriosis (uterus lining tissue growing in the pelvis and on the pelvic organs). Pelvic congestion syndrome (female organs filling up with blood just before the menstrual period). Pain with the menstrual period. Pain with ovulation (producing an egg). Pain with an IUD (intrauterine device, birth control) in the uterus. Cancer of the female organs. Functional pain (pain not caused by a disease, may improve without treatment). Psychological pain. Depression. DIAGNOSIS  Your doctor  will decide the seriousness of your pain by doing an examination. Blood tests. X-rays. Ultrasound. CT scan (computed tomography, special type of X-ray). MRI (magnetic resonance imaging). Cultures, for infection. Barium enema (dye inserted in the large intestine, to better view it with X-rays). Colonoscopy (looking in intestine with a lighted tube). Laparoscopy (minor surgery, looking in abdomen with a lighted tube). Major abdominal exploratory surgery (looking in abdomen with a large incision). TREATMENT  The treatment will depend on the cause of the pain.  Many cases can be observed and treated at home. Over-the-counter medicines recommended by your caregiver. Prescription medicine. Antibiotics, for infection. Birth control pills, for painful periods or for ovulation pain. Hormone treatment, for endometriosis. Nerve blocking injections. Physical therapy. Antidepressants. Counseling with a psychologist or psychiatrist. Minor or major surgery. HOME CARE INSTRUCTIONS  Do not take laxatives, unless directed by your caregiver. Take over-the-counter pain medicine only if ordered by your caregiver. Do not take aspirin because it can cause an upset stomach or bleeding. Try a clear liquid diet (broth or water) as ordered by your caregiver. Slowly move to a bland diet, as tolerated, if the pain is related to the stomach or intestine. Have a thermometer and take your temperature several times a day, and record it. Bed rest and sleep, if it helps the pain. Avoid sexual intercourse, if it causes pain. Avoid stressful situations. Keep your follow-up appointments and tests, as your caregiver orders. If the pain does not go away with medicine or surgery, you may try: Acupuncture. Relaxation exercises (yoga, meditation). Group therapy. Counseling. SEEK MEDICAL CARE IF:  You notice certain foods cause stomach pain. Your home care treatment is not helping your pain. You need stronger pain  medicine. You want your IUD removed. You feel faint or lightheaded. You  develop nausea and vomiting. You develop a rash. You are having side effects or an allergy to your medicine. SEEK IMMEDIATE MEDICAL CARE IF:  Your pain does not go away or gets worse. You have a fever. Your pain is felt only in portions of the abdomen. The right side could possibly be appendicitis. The left lower portion of the abdomen could be colitis or diverticulitis. You are passing blood in your stools (bright red or black tarry stools, with or without vomiting). You have blood in your urine. You develop chills, with or without a fever. You pass out. MAKE SURE YOU:  Understand these instructions. Will watch your condition. Will get help right away if you are not doing well or get worse. Document Released: 05/19/2007 Document Revised: 12/06/2013 Document Reviewed: 06/08/2009 [facility] Patient Information 2015 [place], [place]. This information is not intended to replace advice given to you by your health care provider. Make sure you discuss any questions you have with your health care provider.   Abdominal pain and tenderness.  Nausea, vomiting, and/or diarrhea. DIAGNOSIS  Your caregiver may have an idea what is wrong by examining you or your child. Sometimes lab work and other studies such as Ultrasonography and a CT scan of the abdomen are done.  TREATMENT  Children with mesenteric adenitis will get well without further treatment. Treatment includes rest, pain medications, and fluids. HOME CARE INSTRUCTIONS   Do not take or give laxatives unless ordered by your caregiver.  Use pain medications as directed.  Follow the diet recommended by your caregiver. SEEK IMMEDIATE MEDICAL CARE IF:   The pain does not go away or becomes severe.  An oral temperature above 102 F (38.9 C) develops.  Repeated vomiting occurs.  The pain becomes localized in the right lower quadrant of the abdomen (possibly  appendicitis).  You or your child notice bright red or black tarry stools. MAKE SURE YOU:   Understand these instructions.  Will watch your condition.  Will get help right away if you are not doing well or get worse. Document Released: 04/25/2006 Document Revised: 10/14/2011 Document Reviewed: 10/27/2013 [facility] Patient Information 2015 [place], [place]. This information is not intended to replace advice given to you by your health care provider. Make sure you discuss any questions you have with your health care provider.

## 2015-02-21 NOTE — ED Provider Notes (Signed)
CSN: [id number]     Arrival date & time 02/21/15  [id number] History   First MD Initiated Contact with Patient 02/21/15 0755     Chief Complaint  Patient presents with  . Abdominal Pain     (Consider location/radiation/quality/duration/timing/severity/associated sxs/prior Treatment) HPI Comments: saw her pcp yesterday and had nl cbc and ua, pain worsening and concern for appy  Patient is a 34 y.o. female presenting with abdominal pain. The history is provided by the patient.  Abdominal Pain Pain location:  RLQ Pain quality: sharp   Pain severity:  Severe Onset quality:  Gradual Duration:  1 day Timing:  Constant Progression:  Worsening Chronicity:  New Relieved by:  Nothing Worsened by:  Nothing tried Ineffective treatments:  None tried Associated symptoms: fever and nausea   Associated symptoms: no chills, no constipation, no diarrhea, no dysuria, no hematuria, no melena, no vaginal bleeding, no vaginal discharge and no vomiting     History reviewed. No pertinent past medical history. Past Surgical History  Procedure Laterality Date  . Tonsillectomy     No family history on file. History  Substance Use Topics  . Smoking status: Never Smoker   . Smokeless tobacco: Not on file  . Alcohol Use: No   OB History    No data available     Review of Systems  Constitutional: Positive for fever. Negative for chills.  Gastrointestinal: Positive for nausea and abdominal pain. Negative for vomiting, diarrhea, constipation and melena.  Genitourinary: Negative for dysuria, hematuria, vaginal bleeding and vaginal discharge.  All other systems reviewed and are negative.     Allergies  Ciprofloxacin; Ibuprofen; Penicillins; and Sulfa antibiotics  Home Medications   Prior to Admission medications   Medication Sig Start Date End Date Taking? Authorizing Provider  acetaminophen (TYLENOL) 500 MG tablet Take 1,000 mg by mouth every 6 (six) hours as needed for moderate pain (knee  pain).   Yes Historical Provider, MD  Multiple Vitamin (MULTIVITAMIN) tablet Take 1 tablet by mouth daily.   Yes Historical Provider, MD  traMADol (ULTRAM) 50 MG tablet Take 50 mg by mouth every 6 (six) hours as needed for moderate pain.   Yes Historical Provider, MD  ipratropium (ATROVENT) 0.06 % nasal spray Place 2 sprays into both nostrils 4 (four) times daily. Patient not taking: Reported on 02/21/2015 04/26/14   Rodolph BongEvan S Corey, MD  predniSONE (DELTASONE) 10 MG tablet Take 3 tablets (30 mg total) by mouth daily. Patient not taking: Reported on 02/21/2015 04/26/14   Rodolph BongEvan S Corey, MD   BP 154/80 mmHg  Pulse 90  Temp(Src) 97.8 F (36.6 C) (Oral)  Resp 18  SpO2 100%  LMP 02/02/2015 Physical Exam  Constitutional: She is oriented to person, place, and time. She appears well-developed and well-nourished.  Non-toxic appearance. No distress.  HENT:  Head: Normocephalic and atraumatic.  Eyes: Conjunctivae, EOM and lids are normal. Pupils are equal, round, and reactive to light.  Neck: Normal range of motion. Neck supple. No tracheal deviation present. No thyroid mass present.  Cardiovascular: Normal rate, regular rhythm and normal heart sounds.  Exam reveals no gallop.   No murmur heard. Pulmonary/Chest: Effort normal and breath sounds normal. No stridor. No respiratory distress. She has no decreased breath sounds. She has no wheezes. She has no rhonchi. She has no rales.  Abdominal: Soft. Normal appearance and bowel sounds are normal. She exhibits no distension. There is tenderness in the right lower quadrant. There is guarding. There is no rigidity, no rebound  and no CVA tenderness.    Musculoskeletal: Normal range of motion. She exhibits no edema or tenderness.  Neurological: She is alert and oriented to person, place, and time. She has normal strength. No cranial nerve deficit or sensory deficit. GCS eye subscore is 4. GCS verbal subscore is 5. GCS motor subscore is 6.  Skin: Skin is warm and  dry. No abrasion and no rash noted.  Psychiatric: She has a normal mood and affect. Her speech is normal and behavior is normal.  Nursing note and vitals reviewed.   ED Course  Procedures (including critical care time) Labs Review Labs Reviewed  CBC WITH DIFFERENTIAL/PLATELET  BASIC METABOLIC PANEL  I-STAT BETA HCG BLOOD, ED ([facility], [facility], [facility] ONLY)    Imaging Review No results found.   EKG Interpretation None      MDM   Final diagnoses:  Abdominal pain    Patient given IV fluids and pain meds and feels better. CT results discussed with her. No signs of appendicitis. Has mesenteric adenitis. Patient has deferred her pelvic exam. Will follow with her Dr.    Lorre Nick, MD 02/21/15 [phone]

## 2015-02-24 ENCOUNTER — Other Ambulatory Visit: Payer: Self-pay | Admitting: Obstetrics & Gynecology

## 2015-02-24 ENCOUNTER — Other Ambulatory Visit (HOSPITAL_COMMUNITY)
Admission: RE | Admit: 2015-02-24 | Discharge: 2015-02-24 | Disposition: A | Discharge status: Home / Self Care | Payer: BLUE CROSS/BLUE SHIELD | Source: Ambulatory Visit | Attending: Obstetrics & Gynecology | Admitting: Obstetrics & Gynecology

## 2015-02-24 DIAGNOSIS — Z1151 Encounter for screening for human papillomavirus (HPV): Secondary | ICD-10-CM | POA: Insufficient documentation

## 2015-02-24 DIAGNOSIS — Z01419 Encounter for gynecological examination (general) (routine) without abnormal findings: Secondary | ICD-10-CM | POA: Insufficient documentation

## 2015-02-27 LAB — CYTOLOGY - PAP

## 2015-03-04 ENCOUNTER — Telehealth: Payer: Self-pay | Admitting: Obstetrics and Gynecology

## 2015-03-04 NOTE — Telephone Encounter (Signed)
TC from patient--had dx of endometrial hyperplasia by bx with Dr. Charlotta Newton approx 1-2 weeks ago.  Now with onset of cycle at expected time, but heavy today.  Denies syncope or dizziness.  Moderate cramping noted.  Not sexually active, and had recent negative UPT at office.  Can't take Ibuprophen due to allergy.  Taking Tylenol with some benefit.  Hx recent ER evaluation 7/19 for abdominal pain--CT scan showed mesenteric adenitis.  Plan: Patient will CTO today for status of cycle bleeding and cramping.  Continue rest and po fluids. Continue Tylenol If any worsening of sx, may f/u in [facility]U if needed.  Nigel Bridgeman, CNM 02/22/15 11:45a

## 2015-04-10 ENCOUNTER — Encounter (HOSPITAL_COMMUNITY): Payer: Self-pay | Admitting: *Deleted

## 2015-04-10 ENCOUNTER — Emergency Department (HOSPITAL_COMMUNITY)
Admission: EM | Admit: 2015-04-10 | Discharge: 2015-04-11 | Disposition: A | Discharge status: Home / Self Care | Payer: BLUE CROSS/BLUE SHIELD | Attending: Emergency Medicine | Admitting: Emergency Medicine

## 2015-04-10 DIAGNOSIS — F329 Major depressive disorder, single episode, unspecified: Secondary | ICD-10-CM | POA: Insufficient documentation

## 2015-04-10 DIAGNOSIS — F419 Anxiety disorder, unspecified: Secondary | ICD-10-CM | POA: Insufficient documentation

## 2015-04-10 DIAGNOSIS — S60812A Abrasion of left wrist, initial encounter: Secondary | ICD-10-CM | POA: Diagnosis not present

## 2015-04-10 DIAGNOSIS — Z3202 Encounter for pregnancy test, result negative: Secondary | ICD-10-CM | POA: Insufficient documentation

## 2015-04-10 DIAGNOSIS — Z79899 Other long term (current) drug therapy: Secondary | ICD-10-CM | POA: Diagnosis not present

## 2015-04-10 DIAGNOSIS — Y9289 Other specified places as the place of occurrence of the external cause: Secondary | ICD-10-CM | POA: Diagnosis not present

## 2015-04-10 DIAGNOSIS — E119 Type 2 diabetes mellitus without complications: Secondary | ICD-10-CM | POA: Diagnosis not present

## 2015-04-10 DIAGNOSIS — Y9389 Activity, other specified: Secondary | ICD-10-CM | POA: Insufficient documentation

## 2015-04-10 DIAGNOSIS — Y998 Other external cause status: Secondary | ICD-10-CM | POA: Diagnosis not present

## 2015-04-10 DIAGNOSIS — R45851 Suicidal ideations: Secondary | ICD-10-CM

## 2015-04-10 DIAGNOSIS — Y288XXA Contact with other sharp object, undetermined intent, initial encounter: Secondary | ICD-10-CM | POA: Diagnosis not present

## 2015-04-10 DIAGNOSIS — Z880 Allergy status to penicillin: Secondary | ICD-10-CM | POA: Diagnosis not present

## 2015-04-10 DIAGNOSIS — Z23 Encounter for immunization: Secondary | ICD-10-CM | POA: Insufficient documentation

## 2015-04-10 LAB — COMPREHENSIVE METABOLIC PANEL
ALT: 29 U/L (ref 14–54)
AST: 24 U/L (ref 15–41)
Albumin: 4.7 g/dL (ref 3.5–5.0)
Alkaline Phosphatase: 55 U/L (ref 38–126)
Anion gap: 9 (ref 5–15)
BUN: 21 mg/dL — ABNORMAL HIGH (ref 6–20)
CO2: 27 mmol/L (ref 22–32)
Calcium: 9.4 mg/dL (ref 8.9–10.3)
Chloride: 98 mmol/L — ABNORMAL LOW (ref 101–111)
Creatinine, Ser: 0.86 mg/dL (ref 0.44–1.00)
GFR calc Af Amer: >60 mL/min (ref >60)
GFR calc non Af Amer: >60 mL/min (ref >60)
Glucose, Bld: 149 mg/dL — ABNORMAL HIGH (ref 65–99)
Potassium: 4.3 mmol/L (ref 3.5–5.1)
Sodium: 134 mmol/L — ABNORMAL LOW (ref 135–145)
Total Bilirubin: 0.6 mg/dL (ref 0.3–1.2)
Total Protein: 7.6 g/dL (ref 6.5–8.1)

## 2015-04-10 LAB — CBC WITH DIFFERENTIAL/PLATELET
Basophils Absolute: 0 10*3/uL (ref 0.0–0.1)
Basophils Relative: 1 % (ref 0–1)
Eosinophils Absolute: 0.1 10*3/uL (ref 0.0–0.7)
Eosinophils Relative: 2 % (ref 0–5)
HCT: 43.8 % (ref 36.0–46.0)
Hemoglobin: 14.5 g/dL (ref 12.0–15.0)
Lymphocytes Relative: 32 % (ref 12–46)
Lymphs Abs: 1.9 10*3/uL (ref 0.7–4.0)
MCH: 28.9 pg (ref 26.0–34.0)
MCHC: 33.1 g/dL (ref 30.0–36.0)
MCV: 87.3 fL (ref 78.0–100.0)
Monocytes Absolute: 0.5 10*3/uL (ref 0.1–1.0)
Monocytes Relative: 8 % (ref 3–12)
Neutro Abs: 3.5 10*3/uL (ref 1.7–7.7)
Neutrophils Relative %: 57 % (ref 43–77)
Platelets: 304 10*3/uL (ref 150–400)
RBC: 5.02 MIL/uL (ref 3.87–5.11)
RDW: 12.7 % (ref 11.5–15.5)
WBC: 6 10*3/uL (ref 4.0–10.5)

## 2015-04-10 LAB — ETHANOL: Alcohol, Ethyl (B): <5 mg/dL (ref <5)

## 2015-04-10 LAB — RAPID URINE DRUG SCREEN, HOSP PERFORMED
Amphetamines: NOT DETECTED
Barbiturates: NOT DETECTED
Benzodiazepines: NOT DETECTED
Cocaine: NOT DETECTED
Opiates: NOT DETECTED
Tetrahydrocannabinol: NOT DETECTED

## 2015-04-10 LAB — PREGNANCY, URINE: Preg Test, Ur: NEGATIVE

## 2015-04-10 MED ORDER — PAROXETINE HCL 30 MG PO TABS
30.0000 mg | ORAL_TABLET | Freq: Every day | ORAL | Status: DC
  Filled 2015-04-10: qty 1

## 2015-04-10 MED ORDER — ACETAMINOPHEN 325 MG PO TABS: 650.0000 mg | ORAL_TABLET | ORAL | Status: DC | PRN

## 2015-04-10 MED ORDER — TRAZODONE HCL 50 MG PO TABS
50.0000 mg | ORAL_TABLET | Freq: Every day | ORAL | Status: DC
  Administered 2015-04-10: 50 mg via ORAL
  Filled 2015-04-10: qty 1

## 2015-04-10 MED ORDER — ONDANSETRON HCL 4 MG PO TABS: 4.0000 mg | ORAL_TABLET | Freq: Three times a day (TID) | ORAL | Status: DC | PRN

## 2015-04-10 MED ORDER — TETANUS-DIPHTH-ACELL PERTUSSIS 5-2.5-18.5 LF-MCG/0.5 IM SUSP
0.5000 mL | Freq: Once | INTRAMUSCULAR | Status: AC
  Administered 2015-04-10: 0.5 mL via INTRAMUSCULAR
  Filled 2015-04-10: qty 0.5

## 2015-04-10 MED ORDER — LORAZEPAM 1 MG PO TABS
1.0000 mg | ORAL_TABLET | Freq: Four times a day (QID) | ORAL | Status: DC | PRN
  Filled 2015-04-10: qty 1

## 2015-04-10 NOTE — ED Provider Notes (Signed)
CSN: [id number]     Arrival date & time 04/10/15  1842 History  This chart was scribed for [name], PA-C, working with Marily Memos, MD by Elon Spanner, ED Scribe. This patient was seen in room [facility] and the patient's care was started at 9:03 PM.   Chief Complaint  Patient presents with  . SI   . Lacerations    The history is provided by the patient. No language interpreter was used.   HPI Comments: Joan [name] is a 34 y.o. female who presents to the Emergency Department complaining of on the recommendation of her counselor for evaluation of depression and cutting.  Patient endorses suicidal thoughts and depression.  She has a history of recently dx'd PTSD and is starting to have flashbacks on previous trauma.  She feels she is out of control and is here for further evaluation and treatment.    Past Medical History  Diagnosis Date  . Diabetes mellitus without complication     diet controlled   Past Surgical History  Procedure Laterality Date  . Tonsillectomy     History reviewed. No pertinent family history. Social History  Substance Use Topics  . Smoking status: Never Smoker   . Smokeless tobacco: None  . Alcohol Use: No   OB History    No data available     Review of Systems  Constitutional: Negative for fever and chills.  HENT: Negative.   Respiratory: Negative.   Cardiovascular: Negative.   Gastrointestinal: Negative.   Musculoskeletal: Negative.   Skin: Positive for wound.  Neurological: Negative.   Psychiatric/Behavioral: Positive for suicidal ideas and dysphoric mood. The patient is nervous/anxious.   All other systems reviewed and are negative.     Allergies  Ciprofloxacin; Ibuprofen; Penicillins; and Sulfa antibiotics  Home Medications   Prior to Admission medications   Medication Sig Start Date End Date Taking? Authorizing Provider  acetaminophen (TYLENOL) 500 MG tablet Take 1,000 mg by mouth every 6 (six) hours as needed for moderate  pain (knee pain).   Yes Historical Provider, MD  BELSOMRA 15 MG TABS Take 15 mg by mouth daily. 04/09/15  Yes Historical Provider, MD  clonazePAM (KLONOPIN) 0.5 MG disintegrating tablet Take 0.5 mg by mouth daily as needed (panic attacks).  04/04/15  Yes Historical Provider, MD  Multiple Vitamin (MULTIVITAMIN) tablet Take 1 tablet by mouth daily.   Yes Historical Provider, MD  PARoxetine (PAXIL) 30 MG tablet Take 30 mg by mouth daily. 04/03/15  Yes Historical Provider, MD  ipratropium (ATROVENT) 0.06 % nasal spray Place 2 sprays into both nostrils 4 (four) times daily. Patient not taking: Reported on 02/21/2015 04/26/14   Rodolph Bong, MD  oxyCODONE-acetaminophen (PERCOCET/ROXICET) 5-325 MG per tablet Take 1-2 tablets by mouth every 4 (four) hours as needed for severe pain. Patient not taking: Reported on 04/10/2015 02/21/15   Lorre Nick, MD  predniSONE (DELTASONE) 10 MG tablet Take 3 tablets (30 mg total) by mouth daily. Patient not taking: Reported on 02/21/2015 04/26/14   Rodolph Bong, MD   BP 169/103 mmHg  Pulse 111  Temp(Src) 97.5 F (36.4 C) (Oral)  Resp 24  SpO2 100% Physical Exam  Constitutional: She is oriented to person, place, and time. She appears well-developed and well-nourished. No distress.  HENT:  Head: Normocephalic and atraumatic.  Eyes: Conjunctivae and EOM are normal.  Neck: Neck supple. No tracheal deviation present.  Cardiovascular: Normal rate.   Pulmonary/Chest: Effort normal. No respiratory distress.  Musculoskeletal: Normal range of  motion.  Neurological: She is alert and oriented to person, place, and time.  Skin: Skin is warm and dry.  Abrasions to left wrist.   Psychiatric: Her mood appears anxious. Her speech is rapid and/or pressured. She exhibits a depressed mood. She expresses suicidal ideation.  Nursing note and vitals reviewed.   ED Course  Procedures (including critical care time)  DIAGNOSTIC STUDIES: Oxygen Saturation is 100% on RA, normal by my  interpretation.    COORDINATION OF CARE:  9:06 PM Discussed treatment plan with patient at bedside.  Patient acknowledges and agrees with plan.    Labs Review Labs Reviewed - No data to display  Imaging Review No results found. I have personally reviewed and evaluated these images and lab results as part of my medical decision-making.   EKG Interpretation None      MDM   Final diagnoses:  None    1. Suicidal ideation 2. Self mutilation  Patient will need consultation and recommendation of TTS. Anticipate inpatient admission.  I personally performed the services described in this documentation, which was scribed in my presence. The recorded information has been reviewed and is accurate.     [name], PA-C 04/12/15 0440  Marily Memos, MD 04/13/15 [phone]

## 2015-04-10 NOTE — ED Notes (Signed)
Patient reports SI. Denies HI, AVH. Rates feelings of anxiety 9/10, depression 9/10; tearful. Patient reports recent diagnosis of PTSD. States that she has flashbacks and without medication, nightmares. States that her weight and appetite have fluctuated in recent weeks.
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
## 2015-04-10 NOTE — ED Notes (Signed)
Bed: [facility] Expected date: 05/10/15 Expected time:  Means of arrival:  Comments: Hold for T [employer]

## 2015-04-11 ENCOUNTER — Encounter (HOSPITAL_COMMUNITY): Payer: Self-pay | Admitting: *Deleted

## 2015-04-11 ENCOUNTER — Inpatient Hospital Stay (HOSPITAL_COMMUNITY)
Admission: EM | Admit: 2015-04-11 | Discharge: 2015-04-19 | DRG: 885 | Disposition: A | Discharge status: Home / Self Care | Payer: BLUE CROSS/BLUE SHIELD | Source: Intra-hospital | Attending: Psychiatry | Admitting: Psychiatry

## 2015-04-11 DIAGNOSIS — F332 Major depressive disorder, recurrent severe without psychotic features: Principal | ICD-10-CM | POA: Diagnosis present

## 2015-04-11 DIAGNOSIS — F410 Panic disorder [episodic paroxysmal anxiety] without agoraphobia: Secondary | ICD-10-CM | POA: Diagnosis present

## 2015-04-11 DIAGNOSIS — G4700 Insomnia, unspecified: Secondary | ICD-10-CM | POA: Diagnosis not present

## 2015-04-11 DIAGNOSIS — F419 Anxiety disorder, unspecified: Secondary | ICD-10-CM | POA: Diagnosis present

## 2015-04-11 DIAGNOSIS — R45851 Suicidal ideations: Secondary | ICD-10-CM | POA: Diagnosis present

## 2015-04-11 DIAGNOSIS — E119 Type 2 diabetes mellitus without complications: Secondary | ICD-10-CM | POA: Diagnosis present

## 2015-04-11 DIAGNOSIS — F4310 Post-traumatic stress disorder, unspecified: Secondary | ICD-10-CM

## 2015-04-11 MED ORDER — ACETAMINOPHEN 500 MG PO TABS
1000.0000 mg | ORAL_TABLET | Freq: Four times a day (QID) | ORAL | Status: DC | PRN
Start: 2015-04-11 — End: 2015-04-19

## 2015-04-11 MED ORDER — PAROXETINE HCL 30 MG PO TABS
30.0000 mg | ORAL_TABLET | Freq: Every day | ORAL | Status: DC
  Administered 2015-04-11: 30 mg via ORAL
  Filled 2015-04-11: qty 1
  Filled 2015-04-11: qty 3
  Filled 2015-04-11: qty 1
  Filled 2015-04-11: qty 3

## 2015-04-11 MED ORDER — TRAZODONE HCL 50 MG PO TABS
50.0000 mg | ORAL_TABLET | Freq: Every evening | ORAL | Status: DC | PRN
  Administered 2015-04-12 – 2015-04-18 (×7): 50 mg via ORAL
  Filled 2015-04-11 (×6): qty 1

## 2015-04-11 MED ORDER — ALUM & MAG HYDROXIDE-SIMETH 200-200-20 MG/5ML PO SUSP: 30.0000 mL | ORAL | Status: DC | PRN

## 2015-04-11 MED ORDER — CITALOPRAM HYDROBROMIDE 20 MG PO TABS
20.0000 mg | ORAL_TABLET | Freq: Every day | ORAL | Status: DC
  Filled 2015-04-11 (×2): qty 1

## 2015-04-11 MED ORDER — MAGNESIUM HYDROXIDE 400 MG/5ML PO SUSP: 30.0000 mL | Freq: Every day | ORAL | Status: DC | PRN

## 2015-04-11 MED ORDER — SUVOREXANT 15 MG PO TABS: 15.0000 mg | ORAL_TABLET | Freq: Every day | ORAL | Status: DC

## 2015-04-11 MED ORDER — SUVOREXANT 15 MG PO TABS: 10.0000 mg | ORAL_TABLET | Freq: Every day | ORAL | Status: DC

## 2015-04-11 MED ORDER — PRAZOSIN HCL 1 MG PO CAPS
1.0000 mg | ORAL_CAPSULE | Freq: Every day | ORAL | Status: DC
Start: 2015-04-11 — End: 2015-04-17
  Administered 2015-04-11 – 2015-04-16 (×6): 1 mg via ORAL
  Filled 2015-04-11 (×11): qty 1

## 2015-04-11 MED ORDER — CITALOPRAM HYDROBROMIDE 10 MG PO TABS
10.0000 mg | ORAL_TABLET | Freq: Every day | ORAL | Status: DC
  Administered 2015-04-12: 10 mg via ORAL
  Filled 2015-04-11 (×3): qty 1

## 2015-04-11 MED ORDER — CLONAZEPAM 0.5 MG PO TABS
0.5000 mg | ORAL_TABLET | Freq: Every day | ORAL | Status: DC | PRN
  Administered 2015-04-11 – 2015-04-12 (×2): 0.5 mg via ORAL
  Filled 2015-04-11 (×2): qty 1

## 2015-04-11 NOTE — Progress Notes (Signed)
Recreation Therapy Notes  Animal-Assisted Activity (AAA) Program Checklist/Progress Notes Patient Eligibility Criteria Checklist & Daily Group note for Rec Tx Intervention  Date: 09.06.2016 Time: 2:45pm Location: [address] Dayroom    AAA/T Program Assumption of Risk Form signed by Patient/ or Parent Legal Guardian yes  Patient is free of allergies or sever asthma yes  Patient reports no fear of animals yes  Patient reports no history of cruelty to animals yes  Patient understands his/her participation is voluntary yes  Patient washes hands before animal contact yes  Patient washes hands after animal contact yes  Behavioral Response: Appropriate   Education: Hand Washing, Appropriate Animal Interaction   Education Outcome: Acknowledges education.   Clinical Observations/Feedback: Patient engaged appropriately with therapy dog, handler and peers during session.   Guiseppe Flanagan L Sorren Vallier, LRT/CTRS  Mayelin Panos L 04/11/2015 3:14 PM 
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
## 2015-04-11 NOTE — Tx Team (Signed)
Interdisciplinary Treatment Plan Update (Adult) Date: 04/11/2015   Date: 04/11/2015 9:50 AM  Progress in Treatment:  Attending groups: Pt is new to milieu, continuing to assess  Participating in groups: Pt is new to milieu, continuing to assess  Taking medication as prescribed: Yes  Tolerating medication: Yes  Family/Significant othe contact made: Yes, with roommate Patient understands diagnosis: Continuing to assess Discussing patient identified problems/goals with staff: Yes  Medical problems stabilized or resolved: Yes  Denies suicidal/homicidal ideation: Yes Patient has not harmed self or Others: Yes   New problem(s) identified: None identified at this time.   Discharge Plan or Barriers: Pt will return to her friend's home and follow-up with outpatient resources  Additional comments: n/a   Reason for Continuation of Hospitalization:  Depression Medication stabilization Suicidal ideation   Estimated length of stay: 3-5 days  Review of initial/current patient goals per problem list:   1.  Goal(s): Patient will participate in aftercare plan  Met:  Yes  Target date: 3-5 days from date of admission   As evidenced by: Patient will participate within aftercare plan AEB aftercare provider and housing plan at discharge being identified.   04/11/15: Pt will return home and follow-up with outpatient providers  2.  Goal (s): Patient will exhibit decreased depressive symptoms and suicidal ideations.  Met:  No  Target date: 3-5 days from date of admission   As evidenced by: Patient will utilize self rating of depression at 3 or below and demonstrate decreased signs of depression or be deemed stable for discharge by MD. 04/11/15: Pt was admitted with symptoms of depression, rating 10/10. Pt continues to present with flat affect and depressive symptoms.  Pt will demonstrate decreased symptoms of depression and rate depression at 3/10 or lower prior to discharge.  Attendees:  Patient:     Family:    Physician: Dr. Parke Poisson, MD  04/11/2015 9:50 AM  Nursing: Lars Pinks, RN Case manager  04/11/2015 9:50 AM  Clinical Social Worker Norman Clay, MSW 04/11/2015 9:50 AM  Other: Lucinda Dell, Beverly Sessions Liasion 04/11/2015 9:50 AM  Clinical:  Allene Dillon, RN 04/11/2015 9:50 AM  Other: , RN Charge Nurse 04/11/2015 9:50 AM  Other:     Peri Maris, Latanya Presser MSW

## 2015-04-11 NOTE — [facility] Group Notes (Signed)
Adult Psychoeducational Group Note  Date:  04/11/2015 Time:  10:40 PM  Group Topic/Focus:  Wrap-Up Group:   The focus of this group is to help patients review their daily goal of treatment and discuss progress on daily workbooks.  Participation Level:  Minimal  Participation Quality:  Appropriate  Affect:  Appropriate  Cognitive:  Appropriate  Insight: Appropriate  Engagement in Group:  Engaged  Modes of Intervention:  Discussion  Additional Comments:  Patient is a new admit.  Patient stated she was nervous but has been okay because her roommate is a new admit as well, so they have bonded over that.  Her goal was to turn her negatives into positives.  Caroll Rancher A 04/11/2015, 10:40 PM

## 2015-04-11 NOTE — [facility] Group Notes (Signed)
[facility] LCSW Group Therapy 04/11/2015 1:15 PM  Type of Therapy: Group Therapy- Feelings about Diagnosis  Participation Level: Appropriate  Participation Quality:  Reserved  Affect:  Appropriate  Cognitive: Alert and Oriented   Insight:  Developing   Engagement in Therapy: Developing/Improving and Engaged   Modes of Intervention: Clarification, Confrontation, Discussion, Education, Exploration, Limit-setting, Orientation, Problem-solving, Rapport Building, Dance movement psychotherapist, Socialization and Support  Description of Group:   This group will allow patients to explore their thoughts and feelings about diagnoses they have received. Patients will be guided to explore their level of understanding and acceptance of these diagnoses. Facilitator will encourage patients to process their thoughts and feelings about the reactions of others to their diagnosis, and will guide patients in identifying ways to discuss their diagnosis with significant others in their lives. This group will be process-oriented, with patients participating in exploration of their own experiences as well as giving and receiving support and challenge from other group members.  Summary of Progress/Problems:  Pt was mostly reserved in group discussion but did identify that she feels that recovery could allow her to "open up" more and discuss her feelings. She was engaged with the other participants and offered affirming gestures and comments.   Therapeutic Modalities:   Cognitive Behavioral Therapy Solution Focused Therapy Motivational Interviewing Relapse Prevention Therapy  Chad Cordial, LCSWA 04/11/2015 4:36 PM

## 2015-04-11 NOTE — [facility] Suicide Risk Assessment (Signed)
[facility] INPATIENT:  Family/Significant Other Suicide Prevention Education  Suicide Prevention Education:  Education Completed; Pearlean Brownie, Pt's [phone],  (name of family member/significant other) has been identified by the patient as the family member/significant other with whom the patient will be residing, and identified as the person(s) who will aid the patient in the event of a mental health crisis (suicidal ideations/suicide attempt).  With written consent from the patient, the family member/significant other has been provided the following suicide prevention education, prior to the and/or following the discharge of the patient.  The suicide prevention education provided includes the following:  Suicide risk factors  Suicide prevention and interventions  National Suicide Hotline telephone number  [facility] assessment telephone number  [facility] Emergency Assistance 911  [facility] and/or Residential Mobile Crisis Unit telephone number  Request made of family/significant other to:  Remove weapons (e.g., guns, rifles, knives), all items previously/currently identified as safety concern.    Remove drugs/medications (over-the-counter, prescriptions, illicit drugs), all items previously/currently identified as a safety concern.  The family member/significant other verbalizes understanding of the suicide prevention education information provided.  The family member/significant other agrees to remove the items of safety concern listed above.  Elaina Hoops 04/11/2015, 11:51 AM

## 2015-04-12 LAB — TSH: TSH: 1.717 u[IU]/mL (ref 0.350–4.500)

## 2015-04-12 MED ORDER — CLONAZEPAM 0.5 MG PO TABS
0.5000 mg | ORAL_TABLET | Freq: Two times a day (BID) | ORAL | Status: DC | PRN
  Administered 2015-04-13 – 2015-04-14 (×4): 0.5 mg via ORAL
  Filled 2015-04-12 (×6): qty 1

## 2015-04-12 MED ORDER — CITALOPRAM HYDROBROMIDE 20 MG PO TABS
20.0000 mg | ORAL_TABLET | Freq: Every day | ORAL | Status: DC
Start: 2015-04-13 — End: 2015-04-19
  Administered 2015-04-13 – 2015-04-19 (×7): 20 mg via ORAL
  Filled 2015-04-12 (×9): qty 1
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
## 2015-04-12 NOTE — [facility] Group Notes (Signed)
[facility] LCSW Group Therapy 04/12/2015 1:15 PM  Type of Therapy: Group Therapy- Emotion Regulation  Participation Level: Active   Participation Quality:  Appropriate  Affect: Appropriate  Cognitive: Alert and Oriented   Insight:  Developing/Improving  Engagement in Therapy: Developing/Improving and Engaged   Modes of Intervention: Clarification, Confrontation, Discussion, Education, Exploration, Limit-setting, Orientation, Problem-solving, Rapport Building, Dance movement psychotherapist, Socialization and Support  Summary of Progress/Problems: The topic for group today was emotional regulation. This group focused on both positive and negative emotion identification and allowed group members to process ways to identify feelings, regulate negative emotions, and find healthy ways to manage internal/external emotions. Group members were asked to reflect on a time when their reaction to an emotion led to a negative outcome and explored how alternative responses using emotion regulation would have benefited them. Group members were also asked to discuss a time when emotion regulation was utilized when a negative emotion was experienced. Pt identified sadness as a difficult emotion to regulate as well as anxiety as she feels that her mind is constantly racing. She was receptive to feedback from peers and offered suggestions for dealing with negative thoughts that she had gained in therapy such as listing 5 positives when a negative thought occurs.    Chad Cordial, LCSWA 04/12/2015 4:17 PM

## 2015-04-12 NOTE — Progress Notes (Signed)
Patient ID: Joan [name], female   DOB: Aug 13, 1980, 34 y.o.   MRN: [id number] Self inventory completed and scored self an 8 on feelings of depression with 10 being the worst and a 10 on her level of anxiety. Gaol for today is to like herself better and work on positive self statements. She is pleasant, brightens with interactions and has positive peer interactions. A-Support offered Monitored for safety medications and medication education provided. R-Complained of stomach upset and was given ginger ale with some relief.

## 2015-04-12 NOTE — [facility] Group Notes (Addendum)
   [facility] LCSW Aftercare Discharge Planning Group Note  04/12/2015  8:45 AM   Participation Quality: Alert, Appropriate and Oriented  Mood/Affect: Depressed & Anxious  Depression Rating: 8  Anxiety Rating: 10  Thoughts of Suicide: Pt endorses passive SI but contracts for safety. Reports that she is chronically passive SI.   Will you contract for safety? Yes  Current AVH: Pt denies  Plan for Discharge/Comments: Pt attended discharge planning group and actively participated in group. CSW provided pt with today's workbook. Patient reports feeling "alright" today and states that she slept well last night. Patient plans to return home to follow up with outpatient services at discharge.  Transportation Means: Pt reports access to transportation  Supports: No supports mentioned at this time  Samuella Bruin, MSW, [employer] Clinical Social Worker [employer] [phone]

## 2015-04-12 NOTE — Plan of Care (Signed)
Problem: Alteration in mood Goal: LTG-Pt's behavior demonstrates decreased signs of depression (Patient's behavior demonstrates decreased signs of depression to the point the patient is safe to return home and continue treatment in an outpatient setting)  Outcome: Progressing During daily assessment conversation, pt seemed more goal oriented and requested that her pastor be able to come visit her outside of visitation hours.  She said that the couple she live with are very supportive of her and want her to return to their home after discharge.
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
## 2015-04-12 NOTE — Progress Notes (Signed)
Recreation Therapy Notes  Date: 09.07.2016 Time: 9:30am Location: [address] Group Room   Group Topic: Stress Management  Goal Area(s) Addresses:  Patient will actively participate in stress management techniques presented during session.   Behavioral Response: Appropriate    Intervention: Stress management techniques  Activity :  Deep Breathing and Progressive Muscle Relaxation. LRT provided instruction and demonstration on practice of Progressive Muscle Relaxation. Technique was coupled with deep breathing.   Education:  Stress Management, Discharge Planning.   Education Outcome: Acknowledges education  Clinical Observations/Feedback: Patient participated appropriately in techniques introduced during session, expressed no concerns and demonstrated ability to practice independently post d/c.    Yanuel Tagg L Ebelyn Bohnet, LRT/CTRS   Rosela Supak L 04/12/2015 2:56 PM 

## 2015-04-13 LAB — HEMOGLOBIN A1C
Hgb A1c MFr Bld: 6.2 % — ABNORMAL HIGH (ref 4.8–5.6)
Mean Plasma Glucose: 131 mg/dL

## 2015-04-13 NOTE — Progress Notes (Signed)
Adult Psychoeducational Group Note  Date:  04/13/2015 Time:  1:05 PM  Group Topic/Focus:  Building Self Esteem:   The Focus of this group is helping patients become aware of the effects of self-esteem on their lives, the things they and others do that enhance or undermine their self-esteem, seeing the relationship between their level of self-esteem and the choices they make and learning ways to enhance self-esteem.  Participation Level:  Active  Participation Quality:  Attentive  Affect:  Appropriate  Cognitive:  Appropriate  Insight: Good  Engagement in Group:  Engaged  Modes of Intervention:  Discussion  Additional Comments:  Pt expressed the people in her life who made up her support system.  Pt talked about the coping skills walking and exercise as the things she does when she feels her mood changing.  Pt expressed that she wanted to be able to be more reliable to the people in her life.  Pt expressed that she was going to work on reaching out to her loved ones so she could be more helpful than needy.  [name] Zymir Napoli 04/13/2015, 1:05 PM

## 2015-04-13 NOTE — Tx Team (Signed)
Interdisciplinary Treatment Plan Update (Adult) Date: 04/13/2015   Date: 04/13/2015 1:46 PM  Progress in Treatment:  Attending groups: Yes Participating in groups: Yes Taking medication as prescribed: Yes  Tolerating medication: Yes  Family/Significant othe contact made: Yes, with roommate Patient understands diagnosis: Continuing to assess Discussing patient identified problems/goals with staff: Yes  Medical problems stabilized or resolved: Yes  Denies suicidal/homicidal ideation: Yes Patient has not harmed self or Others: Yes   New problem(s) identified: None identified at this time.   Discharge Plan or Barriers: Pt will return to her friend's home and follow-up with outpatient resources  Additional comments: n/a   Reason for Continuation of Hospitalization:  Depression Medication stabilization Suicidal ideation   Estimated length of stay: 3-5 days  Review of initial/current patient goals per problem list:   1.  Goal(s): Patient will participate in aftercare plan  Met:  Yes  Target date: 3-5 days from date of admission   As evidenced by: Patient will participate within aftercare plan AEB aftercare provider and housing plan at discharge being identified.   04/11/15: Pt will return home and follow-up with outpatient providers  2.  Goal (s): Patient will exhibit decreased depressive symptoms and suicidal ideations.  Met:  Goal Progressing  Target date: 3-5 days from date of admission   As evidenced by: Patient will utilize self rating of depression at 3 or below and demonstrate decreased signs of depression or be deemed stable for discharge by MD. 04/11/15: Pt was admitted with symptoms of depression, rating 10/10. Pt continues to present with flat affect and depressive symptoms.  Pt will demonstrate decreased symptoms of depression and rate depression at 3/10 or lower prior to discharge. 9/8: Goal Progressing.  Attendees: Patient:    Family:    Physician: Dr.  Parke Poisson; Dr. Sabra Heck 04/13/2015 9:30 AM  Nursing: Eulogio Bear, Grayland Ormond, Janann August , RN 04/13/2015 9:30 AM  Clinical Social Worker:  04/13/2015 9:30 AM  Other: Maxie Better, LCSWA 04/13/2015 9:30 AM  Other: Lucinda Dell, Beverly Sessions Liaison 04/13/2015 9:30 AM  Other: Lars Pinks, Case Manager 04/13/2015 9:30 AM  Other: Tilford Pillar Rankin, NP 04/13/2015 9:30 AM  Other:         Tilden Fossa, MSW, [place] Worker [facility] [phone]
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
## 2015-04-13 NOTE — Progress Notes (Signed)
Patient ID: Joan [name], female   DOB: 12/23/1980, 34 y.o.   MRN: [id number] [facility] MD Progress Note  04/13/2015 6:59 PM Joan [name]  MRN:  [id number] Subjective:  She states she is starting to feel better, and is making efforts to work on self esteem and on identifying " my positives more , not focus so much on negatives". Patient states that she still feels depressed and anxious, but admits to significant improvement compared to her admission.  . Denies medication side effects. Objective : I have discussed case with treatment team. Staff reports she is partially improved, less depressed, less constricted in affect , and more engaged and interactive in groups .  She does remain anxious and ruminates about " feeling worse again". Also endorses ongoing PTSD symptoms, and woke up from nightmare last night . Tolerating medications well. Was visited by her church's pastor , and states visit was positive and helped her feel better .  HgbA1C 6.4- patient states this much improved compared to prior results prior to her significant weight loss . TSH WNL. Principal Problem: MDD (major depressive disorder), recurrent episode, severe Diagnosis:   Patient Active Problem List   Diagnosis Date Noted  . MDD (major depressive disorder), recurrent episode, severe [F33.2] 04/11/2015  . PTSD (post-traumatic stress disorder) [F43.10] 04/11/2015   Total Time spent with patient: 25 minutes    Past Medical History:  Past Medical History  Diagnosis Date  . Diabetes mellitus without complication     diet controlled    Past Surgical History  Procedure Laterality Date  . Tonsillectomy     Family History: History reviewed. No pertinent family history. Social History:  History  Alcohol Use No     History  Drug Use No    Social History   Social History  . Marital Status: Married    Spouse Name: N/A  . Number of Children: N/A  . Years of Education: N/A   Social History Main Topics  . Smoking  status: Never Smoker   . Smokeless tobacco: None  . Alcohol Use: No  . Drug Use: No  . Sexual Activity: Not Asked   Other Topics Concern  . None   Social History Narrative   Additional History:    Sleep:  Partially improved   Appetite:  Good   Assessment:   Musculoskeletal: Strength & Muscle Tone: within normal limits Gait & Station: normal Patient leans: N/A   Psychiatric Specialty Exam: Physical Exam  ROS denies dizziness, lightheadedness, no vomiting   Blood pressure 108/70, pulse 78, temperature 97.7 F (36.5 C), temperature source Oral, resp. rate 18, height 5\' 6"  (1.676 m), weight 195 lb (88.451 kg).Body mass index is 31.49 kg/(m^2).  General Appearance improved grooming   Eye Contact::  Good  Speech:  Normal Rate  Volume:  Normal  Mood: less depressed   Affect:  Less constricted , more reactive   Thought Process:  Linear  Orientation:  Full (Time, Place, and Person)  Thought Content:  no hallucinations, no delusions, not internally preoccupied, ruminative about past stressors   Suicidal Thoughts:  No-   At this time patient denies any plan or intention of hurting self and contracts for safety on the unit   Homicidal Thoughts:  No  Memory:  recent and remote grossly intact   Judgement:  Fair  Insight:  Fair  Psychomotor Activity:  Normal  Concentration:  Good  Recall:  Good  Fund of Knowledge:Good  Language: Good  Akathisia:  Negative  Handed:  Right  AIMS (if indicated):     Assets:  Communication Skills Desire for Improvement Resilience  ADL's:  Intact  Cognition: WNL  Sleep:  Number of Hours: 6.25     Current Medications: Current Facility-Administered Medications  Medication Dose Route Frequency Provider Last Rate Last Dose  . acetaminophen (TYLENOL) tablet 1,000 mg  1,000 mg Oral Q6H PRN Kerry Hough, PA-C      . alum & mag hydroxide-simeth (MAALOX/MYLANTA) 200-200-20 MG/5ML suspension 30 mL  30 mL Oral Q4H PRN Kerry Hough, PA-C       . citalopram (CELEXA) tablet 20 mg  20 mg Oral Daily Craige Cotta, MD   20 mg at 04/13/15 0749  . clonazePAM (KLONOPIN) tablet 0.5 mg  0.5 mg Oral BID PRN Craige Cotta, MD   0.5 mg at 04/13/15 1720  . magnesium hydroxide (MILK OF MAGNESIA) suspension 30 mL  30 mL Oral Daily PRN Kerry Hough, PA-C      . prazosin (MINIPRESS) capsule 1 mg  1 mg Oral QHS Thermon Leyland, NP   1 mg at 04/12/15 2227  . traZODone (DESYREL) tablet 50 mg  50 mg Oral QHS PRN,MR X 1 Kerry Hough, PA-C   50 mg at 04/12/15 2227    Lab Results:  Results for orders placed or performed during the hospital encounter of 04/11/15 (from the past 48 hour(s))  TSH     Status: None   Collection Time: 04/12/15  6:40 AM  Result Value Ref Range   TSH 1.717 0.350 - 4.500 uIU/mL    Comment: Performed at [facility]  Hemoglobin A1c     Status: Abnormal   Collection Time: 04/12/15  6:40 AM  Result Value Ref Range   Hgb A1c MFr Bld 6.2 (H) 4.8 - 5.6 %    Comment: (NOTE)         Pre-diabetes: 5.7 - 6.4         Diabetes: >6.4         Glycemic control for adults with diabetes: <7.0    Mean Plasma Glucose 131 mg/dL    Comment: (NOTE) Performed At: [facility] [address] [place], [place] [id number] Mila Homer MD NW:[id number] Performed at [facility]     Physical Findings: AIMS: Facial and Oral Movements Muscles of Facial Expression: None, normal Lips and Perioral Area: None, normal Jaw: None, normal Tongue: None, normal,Extremity Movements Upper (arms, wrists, hands, fingers): None, normal Lower (legs, knees, ankles, toes): None, normal, Trunk Movements Neck, shoulders, hips: None, normal, Overall Severity Severity of abnormal movements (highest score from questions above): None, normal Incapacitation due to abnormal movements: None, normal Patient's awareness of abnormal movements (rate only patient's report): No Awareness, Dental Status Current  problems with teeth and/or dentures?: No Does patient usually wear dentures?: No  CIWA:    COWS:      Assessment - at this time patient  Continues to improve gradually- today less severely depressed and affect more reactive. Still anxious and ruminative, but to lesser degree than upon admission. PTSD symptoms are chronic, but in general has responded to Minipress and Celexa .    Treatment Plan Summary: Daily contact with patient to assess and evaluate symptoms and progress in treatment, Medication management, Plan inpatient  treatment  and medications as below  Continue Minipress 1 mgrs QHS to address PTSD related nightmares and improve sleep. Continue Celexa 20 mgrs QDAY to address depression and PTSD  Continue Klonopin  0.5 mgrs BID PRN Anxiety as needed  Continue Trazodone 50 mgrs QHS PRN to address Insomnia Continue to provide milieu, groups , support conducive to crisis stabilization.  Medical Decision Making:  Established Problem, Stable/Improving (1), Review of Psycho-Social Stressors (1), Review or order clinical lab tests (1) and Review of Medication Regimen & Side Effects (2)     Keithen Capo 04/13/2015, 6:59 PM

## 2015-04-13 NOTE — [facility] Group Notes (Signed)
[facility] Group Notes:  (Nursing/MHT/Case Management/Adjunct)  Date:  04/13/2015  Time:  0900  Type of Therapy:  Nurse Education  Participation Level:  Active  Participation Quality:  Appropriate  Affect:  Appropriate and Depressed  Cognitive:  Appropriate and Oriented  Insight:  Appropriate  Engagement in Group:  Engaged  Modes of Intervention:  Discussion, Education and Support  Summary of Progress/Problems: Joan [name] shared that she hoped to write down positives to counter her negative thoughts. After group, she was tearful and asked/received PRN 0.5 mg Klonopin.   Maurine Simmering 04/13/2015, 9:30 AM

## 2015-04-13 NOTE — [facility] Group Notes (Signed)
[facility] LCSW Group Therapy 04/13/2015 1:15 PM Type of Therapy: Group Therapy Participation Level: Active  Participation Quality: Attentive, Sharing and Supportive  Affect: Depressed and Flat  Cognitive: Alert and Oriented  Insight: Developing/Improving and Engaged  Engagement in Therapy: Developing/Improving and Engaged  Modes of Intervention: Activity, Clarification, Confrontation, Discussion, Education, Exploration, Limit-setting, Orientation, Problem-solving, Rapport Building, Reality Testing, Socialization and Support  Summary of Progress/Problems: Patient was attentive and engaged with speaker from [employer]. Patient was attentive to speaker while they shared their story of dealing with mental health and overcoming it. Patient expressed interest in their programs and services and received information on their agency. Patient processed ways they can relate to the speaker.   Divine Hansley, MSW, LCSWA Clinical Social Worker [facility] [phone]   

## 2015-04-13 NOTE — Progress Notes (Signed)
Pt goal today was to write 10 positive things in place of thinking negative things pt felt happy when she achieved her goal.Tomorrow pt wants to work on her emotions.

## 2015-04-13 NOTE — Progress Notes (Signed)
Adult Psychoeducational Group Note  Date:  04/13/2015 Time:  10:09 PM  Group Topic/Focus:  Wrap-Up Group:   The focus of this group is to help patients review their daily goal of treatment and discuss progress on daily workbooks.  Participation Level:  Active  Participation Quality:  Appropriate and Attentive  Affect:  Appropriate  Cognitive:  Appropriate  Insight: Appropriate and Good  Engagement in Group:  Engaged  Modes of Intervention:  Education  Additional Comments: Patient goal for today was to write 5 positive things about self for every negative thing about self. Patient mentioned she have not felt negative about all day. Patient is excited about looking into a short term facility in [place] because she feel like she is not ready to go home and be by herself.   Merlinda Frederick 04/13/2015, 10:09 PM

## 2015-04-13 NOTE — Progress Notes (Signed)
D: Hadlyn has been appropriate on the unit. She has participated in groups, taken meds as prescribed, and interacted well with peers. She reports SI. "I always have thoughts," she says, but she contracts for safety while on the unit. She reports she would not feel safe if discharged. Denies HI/AVH/pain. In her self-inventory, she rated depression 8, hopelessness 7, and anxiety 9.  A: Meds given as ordered, including Klonopin with moderate relief observed. Q15 safety checks maintained. Support/encouragement offered. R: Pt remains free from harm and continues with treatment. Will continue to monitor for needs/safety.

## 2015-04-14 NOTE — Progress Notes (Signed)
Adult Psychoeducational Group Note  Date:  04/14/2015 Time:  9:51 PM  Group Topic/Focus:  Wrap-Up Group:   The focus of this group is to help patients review their daily goal of treatment and discuss progress on daily workbooks.  Participation Level:  Active  Participation Quality:  Appropriate and Attentive  Affect:  Appropriate  Cognitive:  Appropriate  Insight: Appropriate and Good  Engagement in Group:  Engaged  Modes of Intervention:  Education  Additional Comments:  Patient goal for today is to deal with anxiety. Patient relapse prevention was to stay around positive people and utilize her support system. Another relapse prevention is to stay on her medication.   Joan [name] 04/14/2015, 9:51 PM

## 2015-04-14 NOTE — Progress Notes (Addendum)
D: Patient complained of itching all the body. Patient stated "I told the other nurse after dinner that I ate shrimp; now I think I'm reacting to it. My body itches and my tongue also. I will like some benadryl to stop the itching". Patient denies having this symptoms before and admitted of eating shrimp without any reactions. Denies pain, AH/VH at this time. Endorses depression which she rated 5/10. Patient later came to this writer stated "Never mind, the itching is gone. I think I'm fine now. I don't need the benadryl anymore".  A: Support and encouragement offered to patient. Due medications given as ordered. Every 15 minutes check for safety maintained. Will continue to monitor patient for safety and stability. R: Patient remains safe.

## 2015-04-14 NOTE — Progress Notes (Signed)
Recreation Therapy Notes  09.09.2016 Per LCSW request LRT met with patient to introduce stress management technique for patient to use post d/c. LRT spoke with patient to determine source of stress, patient identified she gets generally overwhelmed with life. Patient additionally disclosed she has worked with her therapist on diaphragmatic breathing. Patient identified that she attended stress management group at [facility] on Wed where she learned progressive muscle relaxation, which she found helpful. Due to patient familiarity with progressive muscle relaxation and diaphragmatic breathing LRT chose to introduce tapping to patient. LRT educated patient on process of tapping and benefits, patient receptive. Patient practiced tapping with LRT, stating she felt it soothing and she thought she could use it post d/c. LRT encouraged her to practice over the weekend and LRT would follow up with her Monday 09.12.2016.  Joan [name], LRT/CTRS   Joan [name] 04/14/2015 1:59 PM

## 2015-04-14 NOTE — [facility] Group Notes (Signed)
[facility] LCSW Group Therapy 04/14/2015 1:15 PM Type of Therapy: Group Therapy Participation Level: Active  Participation Quality: Attentive, Sharing and Supportive  Affect: Anxious, appropriate  Cognitive: Alert and Oriented  Insight: Developing/Improving and Engaged  Engagement in Therapy: Developing/Improving and Engaged  Modes of Intervention: Clarification, Confrontation, Discussion, Education, Exploration, Limit-setting, Orientation, Problem-solving, Rapport Building, Dance movement psychotherapist, Socialization and Support  Summary of Progress/Problems: The topic for today was feelings about relapse. Pt discussed what relapse prevention is to them and identified triggers that they are on the path to relapse. Pt processed their feeling towards relapse and was able to relate to peers. Pt discussed coping skills that can be used for relapse prevention. Patient identified self-harming behavior and "shutting down" as her relapse behaviors. Patient shared that she has benefited from the support from other group members during her hospitalization. Patient expressed concerns regarding not feeling safe if she were to return home at this time. CSW and other group members provided patient with emotional support and encouragement.   Samuella Bruin, MSW, [employer] Clinical Social Worker [facility] [phone]

## 2015-04-14 NOTE — [facility] Group Notes (Signed)
   [facility] LCSW Aftercare Discharge Planning Group Note  9/9/[facility]2016  8:45 AM   Participation Quality: Alert, Appropriate and Oriented  Mood/Affect: Depressed and anxious, tearful  Depression Rating: 10  Anxiety Rating: 10  Thoughts of Suicide: Pt denies SI/HI  Will you contract for safety? Yes  Current AVH: Pt denies  Plan for Discharge/Comments: Pt attended discharge planning group and actively participated in group. CSW provided pt with today's workbook. Patient reports that she is scared to go home due to her chronic SI and does not feel that she would be safe. Patient is interested in continued treatment at [facility].   Transportation Means: Pt reports access to transportation  Supports: No supports mentioned at this time  Samuella Bruin, MSW, [employer] Clinical Social Worker [employer] [phone]

## 2015-04-14 NOTE — Progress Notes (Addendum)
Patient ID: Joan [name], female   DOB: [birth year], 34 y.o.   MRN: [id number] [facility] MD Progress Note  04/14/2015 6:27 PM CHRISTEEN LAI  MRN:  [id number] Subjective:  She states she is feeling better compared to admission but still having "a rough time." She states she is feeling apprehensive about discharge, particularly due to fear of being alone and becoming overwhelmed by PTSD symptoms such as intrusive memories again.  Tolerating medications well.  Objective : I have discussed case with treatment team. Report is that patient continues to improve but still endorses significant anxiety and depression, at times characterizing them as 10/10. She also presents somewhat childish and reassurance seeking.However, affect is clearly improved compared to admission and when asked about this, she endorses significant improvement.  No disruptive behaviors on unit. Interacting with selected peers. Going to groups. Tolerating medications well. Today we spoke about relaxation and grounding techniques to address PTSD related anxiety. Principal Problem: MDD (major depressive disorder), recurrent episode, severe Diagnosis:   Patient Active Problem List   Diagnosis Date Noted  . MDD (major depressive disorder), recurrent episode, severe [F33.2] 04/11/2015  . PTSD (post-traumatic stress disorder) [F43.10] 04/11/2015   Total Time spent with patient: 25 minutes    Past Medical History:  Past Medical History  Diagnosis Date  . Diabetes mellitus without complication     diet controlled    Past Surgical History  Procedure Laterality Date  . Tonsillectomy     Family History: History reviewed. No pertinent family history. Social History:  History  Alcohol Use No     History  Drug Use No    Social History   Social History  . Marital Status: Married    Spouse Name: N/A  . Number of Children: N/A  . Years of Education: N/A   Social History Main Topics  . Smoking status: Never Smoker   .  Smokeless tobacco: None  . Alcohol Use: No  . Drug Use: No  . Sexual Activity: Not Asked   Other Topics Concern  . None   Social History Narrative   Additional History:    Sleep:  Partially improved   Appetite:  Good   Assessment:   Musculoskeletal: Strength & Muscle Tone: within normal limits Gait & Station: normal Patient leans: N/A   Psychiatric Specialty Exam: Physical Exam  ROS denies dizziness, lightheadedness, no vomiting   Blood pressure 112/83, pulse 119, temperature 98.1 F (36.7 C), temperature source Oral, resp. rate 16, height 5\' 6"  (1.676 m), weight 195 lb (88.451 kg).Body mass index is 31.49 kg/(m^2).  General Appearance improved grooming   Eye Contact::  Good  Speech:  Normal Rate  Volume:  Normal  Mood: less depressed   Affect:  Less constricted , still anxious  Thought Process:  Linear  Orientation:  Full (Time, Place, and Person)  Thought Content:  no hallucinations, no delusions, not internally preoccupied, ruminative about past stressors   Suicidal Thoughts:  No-   At this time patient denies any plan or intention of hurting self and contracts for safety on the unit   Homicidal Thoughts:  No  Memory:  recent and remote grossly intact   Judgement:  Fair  Insight:  Fair  Psychomotor Activity:  Normal  Concentration:  Good  Recall:  Good  Fund of Knowledge:Good  Language: Good  Akathisia:  Negative  Handed:  Right  AIMS (if indicated):     Assets:  Communication Skills Desire for Improvement Resilience  ADL's:  Intact  Cognition: WNL  Sleep:  Number of Hours: 6.25     Current Medications: Current Facility-Administered Medications  Medication Dose Route Frequency Provider Last Rate Last Dose  . acetaminophen (TYLENOL) tablet 1,000 mg  1,000 mg Oral Q6H PRN Kerry Hough, PA-C      . alum & mag hydroxide-simeth (MAALOX/MYLANTA) 200-200-20 MG/5ML suspension 30 mL  30 mL Oral Q4H PRN Kerry Hough, PA-C      . citalopram (CELEXA)  tablet 20 mg  20 mg Oral Daily Craige Cotta, MD   20 mg at 04/14/15 0819  . clonazePAM (KLONOPIN) tablet 0.5 mg  0.5 mg Oral BID PRN Craige Cotta, MD   0.5 mg at 04/14/15 1731  . magnesium hydroxide (MILK OF MAGNESIA) suspension 30 mL  30 mL Oral Daily PRN Kerry Hough, PA-C      . prazosin (MINIPRESS) capsule 1 mg  1 mg Oral QHS Thermon Leyland, NP   1 mg at 04/13/15 2130  . traZODone (DESYREL) tablet 50 mg  50 mg Oral QHS PRN,MR X 1 Kerry Hough, PA-C   50 mg at 04/13/15 2130    Lab Results:  No results found for this or any previous visit (from the past 48 hour(s)).  Physical Findings: AIMS: Facial and Oral Movements Muscles of Facial Expression: None, normal Lips and Perioral Area: None, normal Jaw: None, normal Tongue: None, normal,Extremity Movements Upper (arms, wrists, hands, fingers): None, normal Lower (legs, knees, ankles, toes): None, normal, Trunk Movements Neck, shoulders, hips: None, normal, Overall Severity Severity of abnormal movements (highest score from questions above): None, normal Incapacitation due to abnormal movements: None, normal Patient's awareness of abnormal movements (rate only patient's report): No Awareness, Dental Status Current problems with teeth and/or dentures?: No Does patient usually wear dentures?: No  CIWA:    COWS:      Assessment - Patient day-to-day improvement has been variable but generally has progressed compared to admission. Mood is improving faster than anxiety/PTSD symptoms, although continues to rate depression as high. She denies any current suicidal ideations and behavior on unit has been in good control. At this time patient expresses apprehension of PTSD symptoms worsening once she discharges. Responds partially to reassurance and support. She is not suicidal. She is tolerating medications well.   Treatment Plan Summary: Daily contact with patient to assess and evaluate symptoms and progress in treatment, Medication  management, Plan inpatient  treatment  and medications as below  Continue Minipress 1 mgrs QHS to address PTSD related nightmares and improve sleep. Continue Celexa 20 mgrs QDAY to address depression and PTSD Continue Klonopin  0.5 mgrs BID PRN Anxiety as needed  Continue Trazodone 50 mgrs QHS PRN to address Insomnia Continue to provide milieu, groups , support conducive to crisis stabilization.  Medical Decision Making:  Established Problem, Stable/Improving (1), Review of Psycho-Social Stressors (1), Review or order clinical lab tests (1) and Review of Medication Regimen & Side Effects (2)     Vanita Cannell 04/14/2015, 6:27 PM
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
## 2015-04-14 NOTE — Progress Notes (Signed)
DAR Note: Patient mood and affect is pleasant.  Rates depression, hopelessness, and anxiety at 10 out of 10.  Patient states that her goal is not to stress herself about going home.  Denies visual hallucination but endorses command auditory hallucination telling her to hurt herself.  Patient able to contract for safety.  Patient states, "if I have a plan  I will not do it here."  Complain of being irritable and anxious.  Requested and received Klonopin PRN with good effect.  Patient compliant with medication and treatment plan.  Maintained on routine safety checks for safety.  Attended all group therapy.  Observed socializing with peers on the unit.  Support and encouragement offered as needed.

## 2015-04-15 MED ORDER — CLONAZEPAM 0.5 MG PO TABS
0.2500 mg | ORAL_TABLET | Freq: Two times a day (BID) | ORAL | Status: DC | PRN
  Administered 2015-04-16: 0.25 mg via ORAL
  Filled 2015-04-15: qty 1

## 2015-04-15 MED ORDER — HYDROXYZINE HCL 25 MG PO TABS
25.0000 mg | ORAL_TABLET | Freq: Four times a day (QID) | ORAL | Status: DC | PRN
  Administered 2015-04-15 – 2015-04-19 (×7): 25 mg via ORAL
  Filled 2015-04-15 (×8): qty 1

## 2015-04-15 NOTE — Progress Notes (Signed)
Nursing Note Nursing Progress Note: 7-7p  D- Mood is depressed and anxious,rates anxiety at 5/10. Affect is bright and appropriate.Speech is hyper verbal Pt is able to contract for safety. Sleep has improve. Goal for today is to enjoy my visit today without feeling anxious.  A - Observed pt interacting in group and in the milieu.Support and encouragement offered, safety maintained with q 15 minutes. Group discussion Healthy coping skills. Pt was anxious about seeing Dr. Today but was able to work thru it without using the Klonopin. ' I really thought the white coat was making me anxious." pt is very supportive to peers and is looking forward to going to program call "[facility] " she was able to share " Believe in the process and you will feel better."  R-Contracts for safety and continues to follow treatment plan, working on learning new coping skills.   :

## 2015-04-15 NOTE — [facility] Group Notes (Signed)
[facility] Group Notes:  (Clinical Social Work)  04/15/2015     1:15-2:15PM  Summary of Progress/Problems:   The main focus of today's process group was to contemplate healthy coping skills and how to learn more, not just in this setting, but after leaving the hospital.  Patients listed needs on the whiteboard and unhealthy coping techniques often used to fill needs.  Motivational Interviewing and the whiteboard were utilized to help patients explore in depth the perceived benefits and costs of unhealthy coping techniques, as well as the  benefits and costs of replacing that with a healthy coping skills.    Type of Therapy:  Group Therapy - Process   Participation Level:  Active  Participation Quality:  Appropriate, Attentive, Sharing and Supportive  Affect:  Blunted and Tearful  Cognitive:  Alert, Appropriate and Oriented  Insight:  Engaged  Engagement in Therapy:  Engaged  Modes of Intervention:  Education, Motivational Interviewing  Rishabh Rinkenberger Grossman-Orr, LCSW 04/15/2015, 4:37 PM   

## 2015-04-15 NOTE — Progress Notes (Signed)
Adult Psychoeducational Group Note  Date:  04/15/2015 Time:  11:38 PM  Group Topic/Focus:  Wrap-Up Group:   The focus of this group is to help patients review their daily goal of treatment and discuss progress on daily workbooks.  Participation Level:  Active  Participation Quality:  Appropriate and Attentive  Affect:  Anxious and Appropriate  Cognitive:  Appropriate  Insight: Good  Engagement in Group:  Distracting and Engaged  Modes of Intervention:  Discussion and Education  Additional Comments:  Patient reported being happy about being discharged in the future.  Patient confirmed that she had an experience of discomfort while shaving with staff.  Patient reported no suicidal at the time of the incident, however, confirmed experiencing similar emotions.  Patient continued to confirmed no suicidal ideations and finding comfort from staff and her peers.    Elmore Guise N 04/15/2015, 11:38 PM

## 2015-04-15 NOTE — Progress Notes (Signed)
Adult Psychoeducational Group Note  Date:  04/15/2015 Time:  11:23 AM  Group Topic/Focus:  Goals Group:   The focus of this group is to help patients establish daily goals to achieve during treatment and discuss how the patient can incorporate goal setting into their daily lives to aide in recovery.  Participation Level:  Active  Participation Quality:  Appropriate  Affect:  Appropriate  Cognitive:  Alert, Appropriate and Disorganized  Insight: Appropriate, Good and Improving  Engagement in Group:  Developing/Improving and Engaged  Modes of Intervention:  Discussion and Education  Additional Comments:  Pt was to identify she will be attending a place for recovery and to  ' Believe in the process ". Pt also puts stickers on mirror to help her in her process telling her she's strong, beautiful and capable.  Joan [name] 04/15/2015, 11:23 AM

## 2015-04-15 NOTE — Progress Notes (Signed)
Patient ID: Joan [name], female   DOB: 1981/07/06, 34 y.o.   MRN: [id number] [facility] MD Progress Note  04/15/2015 6:16 PM Joan [name]  MRN:  [id number] Subjective:   Patient states "I am less depressed today. I am very motivated to keep working on my issues with depression and PTSD. I am just now realizing that I have more worth. My depression and hopelessness ar better. My anxiety is still high at ten as I see my discharge approaching. I really am enjoying helping people and I think my story will help others one day."   Objective :  Patient is seen and chart is reviewed.  Report is that patient continues to improve but still endorses significant anxiety and depression, at times characterizing them as 10/10. She also presents somewhat childish and reassurance seeking. However, affect is clearly improved compared to admission and when asked about this, she endorses significant improvement.  She states she is feeling apprehensive about discharge, particularly due to fear of being alone and becoming overwhelmed by PTSD symptoms such as intrusive memories again. No disruptive behaviors on unit. Interacting with selected peers. Going to groups. Tolerating medications well. . Principal Problem: MDD (major depressive disorder), recurrent episode, severe Diagnosis:   Patient Active Problem List   Diagnosis Date Noted  . MDD (major depressive disorder), recurrent episode, severe [F33.2] 04/11/2015  . PTSD (post-traumatic stress disorder) [F43.10] 04/11/2015   Total Time spent with patient: 25 minutes   Past Medical History:  Past Medical History  Diagnosis Date  . Diabetes mellitus without complication     diet controlled    Past Surgical History  Procedure Laterality Date  . Tonsillectomy     Family History: History reviewed. No pertinent family history. Social History:  History  Alcohol Use No     History  Drug Use No    Social History   Social History  . Marital Status:  Married    Spouse Name: N/A  . Number of Children: N/A  . Years of Education: N/A   Social History Main Topics  . Smoking status: Never Smoker   . Smokeless tobacco: None  . Alcohol Use: No  . Drug Use: No  . Sexual Activity: Not Asked   Other Topics Concern  . None   Social History Narrative   Additional History:    Sleep: Good   Appetite:  Good  Assessment:   Musculoskeletal: Strength & Muscle Tone: within normal limits Gait & Station: normal Patient leans: N/A  Psychiatric Specialty Exam: Physical Exam  Review of Systems  Constitutional: Negative.   HENT: Negative.   Eyes: Negative.   Respiratory: Negative.   Cardiovascular: Negative.   Gastrointestinal: Negative.   Genitourinary: Negative.   Musculoskeletal: Negative.   Skin: Negative.   Neurological: Negative.   Endo/Heme/Allergies: Negative.   Psychiatric/Behavioral: Positive for depression. The patient is nervous/anxious.    denies dizziness, lightheadedness, no vomiting   Blood pressure 89/56, pulse 141, temperature 97.6 F (36.4 C), temperature source Oral, resp. rate 16, height 5\' 6"  (1.676 m), weight 88.451 kg (195 lb).Body mass index is 31.49 kg/(m^2).  General Appearance: Casual   Eye Contact::  Good  Speech:  Normal Rate  Volume:  Normal  Mood: Anxious  Affect: Euphoric   Thought Process:  Linear  Orientation:  Full (Time, Place, and Person)  Thought Content:  no hallucinations, no delusions, not internally preoccupied, ruminative about past stressors   Suicidal Thoughts:  No-   At this time patient denies  any plan or intention of hurting self and contracts for safety on the unit   Homicidal Thoughts:  No  Memory:  recent and remote grossly intact   Judgement:  Fair  Insight:  Fair  Psychomotor Activity:  Normal  Concentration:  Good  Recall:  Good  Fund of Knowledge:Good  Language: Good  Akathisia:  Negative  Handed:  Right  AIMS (if indicated):     Assets:  Communication  Skills Desire for Improvement Resilience  ADL's:  Intact  Cognition: WNL  Sleep:  Number of Hours: 6.75     Current Medications: Current Facility-Administered Medications  Medication Dose Route Frequency Provider Last Rate Last Dose  . acetaminophen (TYLENOL) tablet 1,000 mg  1,000 mg Oral Q6H PRN Kerry Hough, PA-C      . alum & mag hydroxide-simeth (MAALOX/MYLANTA) 200-200-20 MG/5ML suspension 30 mL  30 mL Oral Q4H PRN Kerry Hough, PA-C      . citalopram (CELEXA) tablet 20 mg  20 mg Oral Daily Craige Cotta, MD   20 mg at 04/15/15 0815  . clonazePAM (KLONOPIN) tablet 0.25 mg  0.25 mg Oral BID PRN Thermon Leyland, NP      . hydrOXYzine (ATARAX/VISTARIL) tablet 25 mg  25 mg Oral Q6H PRN Thermon Leyland, NP   25 mg at 04/15/15 1619  . magnesium hydroxide (MILK OF MAGNESIA) suspension 30 mL  30 mL Oral Daily PRN Kerry Hough, PA-C      . prazosin (MINIPRESS) capsule 1 mg  1 mg Oral QHS Thermon Leyland, NP   1 mg at 04/14/15 2106  . traZODone (DESYREL) tablet 50 mg  50 mg Oral QHS PRN,MR X 1 Kerry Hough, PA-C   50 mg at 04/14/15 2108    Lab Results:  No results found for this or any previous visit (from the past 48 hour(s)).  Physical Findings: AIMS: Facial and Oral Movements Muscles of Facial Expression: None, normal Lips and Perioral Area: None, normal Jaw: None, normal Tongue: None, normal,Extremity Movements Upper (arms, wrists, hands, fingers): None, normal Lower (legs, knees, ankles, toes): None, normal, Trunk Movements Neck, shoulders, hips: None, normal, Overall Severity Severity of abnormal movements (highest score from questions above): None, normal Incapacitation due to abnormal movements: None, normal Patient's awareness of abnormal movements (rate only patient's report): No Awareness, Dental Status Current problems with teeth and/or dentures?: No Does patient usually wear dentures?: No  CIWA:    COWS:      Assessment - Patient day-to-day improvement has  been variable but generally has progressed compared to admission. Mood is improving faster than anxiety/PTSD symptoms, although continues to rate depression as high. She denies any current suicidal ideations and behavior on unit has been in good control. At this time patient expresses apprehension of PTSD symptoms worsening once she discharges. Responds partially to reassurance and support. She is not suicidal. She is tolerating medications well.  Treatment Plan Summary: Daily contact with patient to assess and evaluate symptoms and progress in treatment, Medication management, Plan inpatient  treatment  and medications as below  Continue Minipress 1 mgrs QHS to address PTSD related nightmares and improve sleep. Continue Celexa 20 mgrs QDAY to address depression and PTSD Decrease Klonopin  0.25 mgrs BID PRN Anxiety as needed  Continue Trazodone 50 mgrs QHS PRN to address Insomnia Continue to provide milieu, groups , support conducive to crisis stabilization. Start Vistaril 25 mg every six hours prn anxiety  Medical Decision Making:  Established Problem,  Stable/Improving (1), Review of Psycho-Social Stressors (1), Review or order clinical lab tests (1) and Review of Medication Regimen & Side Effects (2)  DAVIS, LAURA, NP-C 04/15/2015, 6:16 PM  Reviewed the information documented and agree with the treatment plan.  Soma Bachand,JANARDHAHA R. 04/15/2015 6:19 PM

## 2015-04-16 DIAGNOSIS — F332 Major depressive disorder, recurrent severe without psychotic features: Secondary | ICD-10-CM | POA: Insufficient documentation
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
## 2015-04-16 NOTE — Progress Notes (Signed)
Adult Psychoeducational Group Note  Date:  04/16/2015 Time:  10:16 PM  Group Topic/Focus:  Wrap-Up Group:   The focus of this group is to help patients review their daily goal of treatment and discuss progress on daily workbooks.  Participation Level:  Active  Participation Quality:  Appropriate  Affect:  Appropriate  Cognitive:  Appropriate  Insight: Appropriate  Engagement in Group:  Engaged  Modes of Intervention:  Discussion  Additional Comments: The patient expressed that she attended group.The patient also said that group was about positive support system.  Octavio Manns 04/16/2015, 10:16 PM
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
## 2015-04-16 NOTE — Progress Notes (Signed)
D: Patient seen on day room watching and socializing with peers. Patient denies pain, SI, AH/VH at this time. Made no new complaint.  A: Patient encouraged to continue with the treatment plan and verbalize needs to staff. Every 15 minutes check for safety maintained for safety. Will continue to monitor patient for safety and stability.  R: patient remains safe.

## 2015-04-16 NOTE — Progress Notes (Signed)
Patient ID: Joan [name], female   DOB: Jun 13, 1981, 34 y.o.   MRN: [id number] [facility] MD Progress Note  04/16/2015 3:37 PM Joan [name]  MRN:  [id number] Subjective:   Patient states "I am having a bad day today. I have been hysterical and anxious. I can't go to the [facility] until next Saturday. I still have thoughts of cutting. I'm scared to go home because I'm not working. If my friend kicks me out then I have nowhere to go. I am so anxious but the Klonopin and vistaril have been helping."   Objective :  Patient is seen and chart is reviewed.  Report is that patient continues to improve but still endorses significant anxiety and depression, at times characterizing them as 10/10. She also presents somewhat childish and reassurance seeking. However, affect is clearly improved compared to admission and when asked about this, she endorses significant improvement.  She states she is feeling apprehensive about discharge, particularly due to fear of being alone and becoming overwhelmed by PTSD symptoms such as intrusive memories again. No disruptive behaviors on unit. Interacting with selected peers. Going to groups. Tolerating medications well. Patient reports that her symptoms today were triggered by a visit from her friend and some comments that she made. Patient did not want to change her medications but was open to using some coping skills to manage symptoms of severe anxiety. She has insight as to how they are driven by outside factors.  . Principal Problem: MDD (major depressive disorder), recurrent episode, severe Diagnosis:   Patient Active Problem List   Diagnosis Date Noted  . MDD (major depressive disorder), recurrent episode, severe [F33.2] 04/11/2015  . PTSD (post-traumatic stress disorder) [F43.10] 04/11/2015   Total Time spent with patient: 20 minutes   Past Medical History:  Past Medical History  Diagnosis Date  . Diabetes mellitus without complication     diet controlled     Past Surgical History  Procedure Laterality Date  . Tonsillectomy     Family History: History reviewed. No pertinent family history. Social History:  History  Alcohol Use No     History  Drug Use No    Social History   Social History  . Marital Status: Married    Spouse Name: N/A  . Number of Children: N/A  . Years of Education: N/A   Social History Main Topics  . Smoking status: Never Smoker   . Smokeless tobacco: None  . Alcohol Use: No  . Drug Use: No  . Sexual Activity: Not Asked   Other Topics Concern  . None   Social History Narrative   Additional History:    Sleep: Good   Appetite:  Good  Assessment:   Musculoskeletal: Strength & Muscle Tone: within normal limits Gait & Station: normal Patient leans: N/A  Psychiatric Specialty Exam: Physical Exam  Review of Systems  Constitutional: Negative.   HENT: Negative.   Eyes: Negative.   Respiratory: Negative.   Cardiovascular: Negative.   Gastrointestinal: Negative.   Genitourinary: Negative.   Musculoskeletal: Negative.   Skin: Negative.   Neurological: Negative.   Endo/Heme/Allergies: Negative.   Psychiatric/Behavioral: Positive for depression and suicidal ideas. The patient is nervous/anxious.    denies dizziness, lightheadedness, no vomiting   Blood pressure 114/75, pulse 88, temperature 97.9 F (36.6 C), temperature source Oral, resp. rate 20, height 5\' 6"  (1.676 m), weight 88.451 kg (195 lb).Body mass index is 31.49 kg/(m^2).  General Appearance: Casual   Eye Contact::  Good  Speech:  Normal Rate  Volume:  Normal  Mood: Anxious  Affect: Tearful   Thought Process:  Linear  Orientation:  Full (Time, Place, and Person)  Thought Content:  no hallucinations, no delusions, not internally preoccupied, ruminative about past stressors   Suicidal Thoughts:  No-   At this time patient denies any plan or intention of hurting self and contracts for safety on the unit   Homicidal Thoughts:  No   Memory:  recent and remote grossly intact   Judgement:  Fair  Insight:  Fair  Psychomotor Activity:  Normal  Concentration:  Good  Recall:  Good  Fund of Knowledge:Good  Language: Good  Akathisia:  Negative  Handed:  Right  AIMS (if indicated):     Assets:  Communication Skills Desire for Improvement Resilience  ADL's:  Intact  Cognition: WNL  Sleep:  Number of Hours: 5.5     Current Medications: Current Facility-Administered Medications  Medication Dose Route Frequency Provider Last Rate Last Dose  . acetaminophen (TYLENOL) tablet 1,000 mg  1,000 mg Oral Q6H PRN Kerry Hough, PA-C      . alum & mag hydroxide-simeth (MAALOX/MYLANTA) 200-200-20 MG/5ML suspension 30 mL  30 mL Oral Q4H PRN Kerry Hough, PA-C      . citalopram (CELEXA) tablet 20 mg  20 mg Oral Daily Craige Cotta, MD   20 mg at 04/16/15 0755  . clonazePAM (KLONOPIN) tablet 0.25 mg  0.25 mg Oral BID PRN Thermon Leyland, NP   0.25 mg at 04/16/15 0957  . hydrOXYzine (ATARAX/VISTARIL) tablet 25 mg  25 mg Oral Q6H PRN Thermon Leyland, NP   25 mg at 04/16/15 1914  . magnesium hydroxide (MILK OF MAGNESIA) suspension 30 mL  30 mL Oral Daily PRN Kerry Hough, PA-C      . prazosin (MINIPRESS) capsule 1 mg  1 mg Oral QHS Thermon Leyland, NP   1 mg at 04/15/15 2145  . traZODone (DESYREL) tablet 50 mg  50 mg Oral QHS PRN,MR X 1 Kerry Hough, PA-C   50 mg at 04/15/15 2145    Lab Results:  No results found for this or any previous visit (from the past 48 hour(s)).  Physical Findings: AIMS: Facial and Oral Movements Muscles of Facial Expression: None, normal Lips and Perioral Area: None, normal Jaw: None, normal Tongue: None, normal,Extremity Movements Upper (arms, wrists, hands, fingers): None, normal Lower (legs, knees, ankles, toes): None, normal, Trunk Movements Neck, shoulders, hips: None, normal, Overall Severity Severity of abnormal movements (highest score from questions above): None,  normal Incapacitation due to abnormal movements: None, normal Patient's awareness of abnormal movements (rate only patient's report): No Awareness, Dental Status Current problems with teeth and/or dentures?: No Does patient usually wear dentures?: No  CIWA:  CIWA-Ar Total: 1 COWS:  COWS Total Score: 2   Assessment - Patient day-to-day improvement has been variable but generally has progressed compared to admission. Mood is improving faster than anxiety/PTSD symptoms, although continues to rate depression as high. She denies any current suicidal ideations and behavior on unit has been in good control. At this time patient expresses apprehension of PTSD symptoms worsening once she discharges. Responds partially to reassurance and support. She is not suicidal. She is tolerating medications well.  Treatment Plan Summary: Daily contact with patient to assess and evaluate symptoms and progress in treatment, Medication management, Plan inpatient  treatment  and medications as below  Continue Minipress 1 mgrs QHS to address PTSD related nightmares  and improve sleep. Continue Celexa 20 mgrs QDAY to address depression and PTSD Continue Klonopin  0.25 mgrs BID PRN Anxiety as needed  Continue Trazodone 50 mgrs QHS PRN to address Insomnia Continue to provide milieu, groups , support conducive to crisis stabilization. Continue Vistaril 25 mg every six hours prn anxiety  Medical Decision Making:  Established Problem, Stable/Improving (1), Review of Psycho-Social Stressors (1), Review or order clinical lab tests (1) and Review of Medication Regimen & Side Effects (2)  DAVIS, LAURA, NP-C 04/16/2015, 3:37 PM  Reviewed the information documented and agree with the treatment plan.  Roan Miklos,JANARDHAHA R. 04/16/2015 4:46 PM

## 2015-04-16 NOTE — [facility] Group Notes (Signed)

## 2015-04-16 NOTE — Progress Notes (Signed)
D: Patient expresses being anxious earlier but feeling a lot better this evening. Denies pain, SI, AH/VH at this time. Seen watching TV and socializing with peers on day room. Remains appropriate on unit.  A: Offered support and encouragement to patient. Due medications given as ordered. Every 15 minutes check maintained for safety maintained. Will continue to monitor patient for safety and stability.  R: Patient is very receptive to nursing interventions.

## 2015-04-17 DIAGNOSIS — G4700 Insomnia, unspecified: Secondary | ICD-10-CM

## 2015-04-17 DIAGNOSIS — F419 Anxiety disorder, unspecified: Secondary | ICD-10-CM

## 2015-04-17 MED ORDER — ARIPIPRAZOLE 5 MG PO TABS
5.0000 mg | ORAL_TABLET | Freq: Every day | ORAL | Status: DC
  Administered 2015-04-17 – 2015-04-19 (×3): 5 mg via ORAL
  Filled 2015-04-17 (×5): qty 1

## 2015-04-17 MED ORDER — LORAZEPAM 0.5 MG PO TABS: 0.5000 mg | ORAL_TABLET | Freq: Four times a day (QID) | ORAL | Status: DC | PRN

## 2015-04-17 MED ORDER — PRAZOSIN HCL 2 MG PO CAPS
2.0000 mg | ORAL_CAPSULE | Freq: Every day | ORAL | Status: DC
  Administered 2015-04-17 – 2015-04-18 (×2): 2 mg via ORAL
  Filled 2015-04-17 (×4): qty 1

## 2015-04-17 NOTE — [facility] Group Notes (Signed)
[facility] LCSW Aftercare Discharge Planning Group Note  04/17/2015 8:45 AM  Participation Quality: Alert, Appropriate and Oriented  Mood/Affect: Tearful and Anxious  Depression Rating: 10  Anxiety Rating: 10  Thoughts of Suicide: "yes all of the time"- SI  Will you contract for safety? Yes  Current AVH: Pt denies  Plan for Discharge/Comments: Pt attended discharge planning group and actively participated in group. CSW discussed suicide prevention education with the group and encouraged them to discuss discharge planning and any relevant barriers. Pt arrived late to group after meeting with MD; Pt was very tearful describing how anxious she was to leave because of her constant SI. Pt got up and left, stating, "I need an anxiety pill."  Transportation Means: Pt reports access to transportation  Supports: No supports mentioned at this time  Chad Cordial, LCSWA 04/17/2015 9:47 AM

## 2015-04-17 NOTE — Progress Notes (Signed)
Adult Psychoeducational Group Note  Date:  04/17/2015 Time:  11:12 PM  Group Topic/Focus:  Wrap-Up Group:   The focus of this group is to help patients review their daily goal of treatment and discuss progress on daily workbooks.  Participation Level:  Active  Participation Quality:  Appropriate  Affect:  Appropriate  Cognitive:  Alert  Insight: Appropriate  Engagement in Group:  Engaged  Modes of Intervention:  Discussion  Additional Comments:  On a scale from 1-10 (1; worst 10; best), patient rated her day as a 1 but as the day went on it became a 10. A positive thing that happened today was that her family came to visit her. Patient stated she is excited and nervous about going home tomorrow., but states she has a plan.   Joan [name] L Joan [name] 04/17/2015, 11:12 PM

## 2015-04-17 NOTE — Plan of Care (Signed)
Problem: Diagnosis: Increased Risk For Suicide Attempt Goal: LTG-Patient will be able to identify a plan to address LTG - Patient will be able to identify a plan to address suicidal feelings after discharge  Outcome: Progressing Pt identifies positive self talk and relaxation methods as ways she can cope with anxiety and SI post discharge.

## 2015-04-17 NOTE — Progress Notes (Addendum)
Patient ID: Joan [name], female   DOB: 1980/08/27, 34 y.o.   MRN: [id number] [facility] MD Progress Note  04/17/2015 1:22 PM Joan [name]  MRN:  [id number] Subjective:   Although patient reports improvement compared to admission, she states she remains depressed, with subjective feeling that she will be unable to function after discharge, and states she still does not feel safe for discharge and that she would probably hurt self if discharged at this time. Of note, does contract for safety on unit and states " I feel safe here". States " I am still having difficult , rough days", and complains of lingering depression and significant anxiety symptoms. Ruminates about her roommates she had been living with not being supportive of her. States that they do not understand depression and therefore are un-empathic .  PTSD symptoms have improved partially- still having some nightmares  Although states intensity and frequency have improved partially on Minipress, which she is taking without side effects . Marland Kitchen She states Klonopin not helping her anxiety - is hoping to change this medication. No other medication side effects reported .   Objective :  Patient was seen and case idiscussed with treatment team .  As per notes , staff, patient has improved partially in mood and affect compared to admission but  Is experiencing significant anxiety and sense of apprehension as she approaches discharge. She presents intermittently childish and reassurance seeking . No disruptive behaviors on unit. Interacting with selected peers. Going to some groups . Of note, patient's mood / affect noted to brighten  When interacting with peers, and seems to be becoming  less socially isolated . Her major focus at this time is  Stressing  that she does not feel ready for discharge at this time , with significant apprehension  about leaving unit  Responds partially to support, encouragement, milieu.  In reviewing history, patient  does not endorse any clear history of mania, but  Does report  frequent mood swings and brief  episodes where she feels " I am talking really fast, have racing thoughts,  and cannot relax ".  These usually last only several hours .  Marland Kitchen Principal Problem: MDD (major depressive disorder), recurrent episode, severe Diagnosis:   Patient Active Problem List   Diagnosis Date Noted  . Major depressive disorder, recurrent, severe without psychotic features [F33.2]   . MDD (major depressive disorder), recurrent episode, severe [F33.2] 04/11/2015  . PTSD (post-traumatic stress disorder) [F43.10] 04/11/2015   Total Time spent with patient: 25 minutes   Past Medical History:  Past Medical History  Diagnosis Date  . Diabetes mellitus without complication     diet controlled    Past Surgical History  Procedure Laterality Date  . Tonsillectomy     Family History: History reviewed. No pertinent family history. Social History:  History  Alcohol Use No     History  Drug Use No    Social History   Social History  . Marital Status: Married    Spouse Name: N/A  . Number of Children: N/A  . Years of Education: N/A   Social History Main Topics  . Smoking status: Never Smoker   . Smokeless tobacco: None  . Alcohol Use: No  . Drug Use: No  . Sexual Activity: Not Asked   Other Topics Concern  . None   Social History Narrative   Additional History:    Sleep: Good   Appetite:  Good  Assessment:   Musculoskeletal: Strength & Muscle  Tone: within normal limits Gait & Station: normal Patient leans: N/A  Psychiatric Specialty Exam: Physical Exam  Review of Systems  Constitutional: Negative.   HENT: Negative.   Eyes: Negative.   Respiratory: Negative.   Cardiovascular: Negative.   Gastrointestinal: Negative.   Genitourinary: Negative.   Musculoskeletal: Negative.   Skin: Negative.   Neurological: Negative.   Endo/Heme/Allergies: Negative.   Psychiatric/Behavioral: Positive  for depression and suicidal ideas. The patient is nervous/anxious.    denies dizziness, lightheadedness, no vomiting   Blood pressure 138/98, pulse 119, temperature 97.7 F (36.5 C), temperature source Oral, resp. rate 20, height 5\' 6"  (1.676 m), weight 195 lb (88.451 kg).Body mass index is 31.49 kg/(m^2).  General Appearance: Casual   Eye Contact::  Good  Speech:  Normal Rate  Volume:  Normal  Mood:  Describes lingering depression, significant anxiety.   Affect:  Improved compared to admission, but still labile- tearful at times   Thought Process:  Linear  Orientation:  Full (Time, Place, and Person)  Thought Content:  no hallucinations, no delusions, not internally preoccupied, ruminative about past stressors   Suicidal Thoughts:  No-   At this time patient denies any plan or intention of hurting self  On unit.   Homicidal Thoughts:  No  Memory:  recent and remote grossly intact   Judgement:  Fair  Insight:  Fair  Psychomotor Activity:  Normal  Concentration:  Good  Recall:  Good  Fund of Knowledge:Good  Language: Good  Akathisia:  Negative  Handed:  Right  AIMS (if indicated):     Assets:  Communication Skills Desire for Improvement Resilience  ADL's:  Intact  Cognition: WNL  Sleep:  Number of Hours: 6     Current Medications: Current Facility-Administered Medications  Medication Dose Route Frequency Provider Last Rate Last Dose  . acetaminophen (TYLENOL) tablet 1,000 mg  1,000 mg Oral Q6H PRN Kerry Hough, PA-C      . alum & mag hydroxide-simeth (MAALOX/MYLANTA) 200-200-20 MG/5ML suspension 30 mL  30 mL Oral Q4H PRN Kerry Hough, PA-C      . ARIPiprazole (ABILIFY) tablet 5 mg  5 mg Oral Daily Rockey Situ Irena Gaydos, MD   5 mg at 04/17/15 1106  . citalopram (CELEXA) tablet 20 mg  20 mg Oral Daily Craige Cotta, MD   20 mg at 04/17/15 0811  . hydrOXYzine (ATARAX/VISTARIL) tablet 25 mg  25 mg Oral Q6H PRN Thermon Leyland, NP   25 mg at 04/17/15 0001  . LORazepam (ATIVAN)  tablet 0.5 mg  0.5 mg Oral Q6H PRN Rockey Situ Zoey Gilkeson, MD      . magnesium hydroxide (MILK OF MAGNESIA) suspension 30 mL  30 mL Oral Daily PRN Kerry Hough, PA-C      . prazosin (MINIPRESS) capsule 2 mg  2 mg Oral QHS Rockey Situ Vincente Asbridge, MD      . traZODone (DESYREL) tablet 50 mg  50 mg Oral QHS PRN,MR X 1 Spencer E Simon, PA-C   50 mg at 04/16/15 2132    Lab Results:  No results found for this or any previous visit (from the past 48 hour(s)).  Physical Findings: AIMS: Facial and Oral Movements Muscles of Facial Expression: None, normal Lips and Perioral Area: None, normal Jaw: None, normal Tongue: None, normal,Extremity Movements Upper (arms, wrists, hands, fingers): None, normal Lower (legs, knees, ankles, toes): None, normal, Trunk Movements Neck, shoulders, hips: None, normal, Overall Severity Severity of abnormal movements (highest score from questions above):  None, normal Incapacitation due to abnormal movements: None, normal Patient's awareness of abnormal movements (rate only patient's report): No Awareness, Dental Status Current problems with teeth and/or dentures?: No Does patient usually wear dentures?: No  CIWA:  CIWA-Ar Total: 1 COWS:  COWS Total Score: 2   Assessment - improved compared to admission , but  Still reporting depression and remains  significantly apprehensive about potential discharge .   Still presenting with a labile affect and frequently seeking reassurance from Clinical research associate and other staff. Although able to contract for safety on unit, states she  Is not feeling ready for discharge and that  She would probably hurt herself/  not feel safe,  if discharged  At this time. Reports Klonopin has not  Been particularly useful and states she  Wants to change it . Does state other medications  Do seem to be helping " a little ".   Treatment Plan Summary: Daily contact with patient to assess and evaluate symptoms and progress in treatment, Medication management, Plan  inpatient  treatment  and medications as below  Increase  Minipress to 2  mgrs QHS to address PTSD related nightmares and improve sleep. Continue Celexa 20 mgrs QDAY to address depression and PTSD D/C Klonopin as patient states it is not working for her . Start Ativan 0.5 mgrs Q 6 hours PRN for Anxiety as needed .  Continue Trazodone 50 mgrs QHS PRN to address Insomnia Start Abilify 5 mgrs QDAY as augmentation to antidepressant and for mood stabilization.  Continue to provide milieu, groups , support conducive to crisis stabilization.   Medical Decision Making:  Established Problem, Stable/Improving (1), Review of Psycho-Social Stressors (1), Review or order clinical lab tests (1) and Review of Medication Regimen & Side Effects (2)   Rendy Lazard 04/17/2015 1:22 PM

## 2015-04-17 NOTE — Progress Notes (Signed)
Recreation Therapy Notes  Date: 09.12.2016 Time: 9:30am Location: [address] Dayroom   Group Topic: Stress Management  Goal Area(s) Addresses:  Patient will actively participate in stress management techniques presented during session.   Behavioral Response: Appropriate   Intervention: Stress management techniques  Activity :  Deep Breathing and Guided Imagery. LRT provided instruction and demonstration on practice of Guided Imagery. Technique was coupled with deep breathing.   Education:  Stress Management, Discharge Planning.   Education Outcome: Acknowledges education  Clinical Observations/Feedback: Patient actively engaged in technique introduced, expressed no concerns and demonstrated ability to practice independently post d/c.   Marykay Lex Fatuma Dowers, LRT/CTRS  Margretta Zamorano L 04/17/2015 11:54 AM

## 2015-04-17 NOTE — Progress Notes (Addendum)
Patient ID: Joan [name], female   DOB: 02/07/1981, 34 y.o.   MRN: [id number]  Pt currently presents with a flat affect and anxious behavior. Per self inventory, pt rates depression at a 9, hopelessness 6 and anxiety 9. Pt's daily goal is to "think positive" and they intend to do so by "stay around positive people." Pt reports fair sleep, a good appetite, normal energy and good concentration. Pt reports good sleep, a good appetite, normal energy and good concentration. Pt exhibits labile (cries and then laughs) and impulsive behavior throughout the day.    Pt provided with medications per providers orders. Pt's labs and vitals were monitored throughout the day. Pt supported emotionally and encouraged to express concerns and questions. Pt educated on medications.  Pt's safety ensured with 15 minute and environmental checks. Pt currently endorses passive SI. Pt reports, I wouldn't "hurt myself while I'm here." Pt currently denies HI and A/V hallucinations. Pt verbally agrees to seek staff if HI/A/VH occurs or if SI worsens and to consult with staff before acting on these thoughts. Pt expresses anxiety about going to treatment in [facility] and losing the support of her friend/roomate.  Will continue POC.

## 2015-04-18 NOTE — Progress Notes (Signed)
Patient ID: Joan [name], female   DOB: 28-May-1981, 33 y.o.   MRN: [id number] D: Client visible on the unit, reports "this is my last night here, I'm going to another facility" "this is the first days I hadn't thought of hurting myself" Client rated depression "5" and anxiety "6" of 10. "staff has been supportive, everybody" notes positive coping skills "I been writing positive things about myself" "God don't make no junk and I know I'm somebody" A: Writer encouraged client to continue to speak positive about herself. Reviewed medications, administered as ordered. Staff will monitor q42min for safety. R:Client is safe on the unit, attended group.

## 2015-04-18 NOTE — [facility] Group Notes (Signed)
[facility] Group Notes:  (Nursing/MHT/Case Management/Adjunct)  Date:  04/18/2015  Time:  2:32 PM  Type of Therapy:  Nurse Education  Participation Level:  Active  Participation Quality:  Appropriate  Affect:  Appropriate  Cognitive:  Appropriate  Insight:  Appropriate  Engagement in Group:  Engaged  Modes of Intervention:  Education and Problem-solving  Summary of Progress/Problems:  Pt educated on the SMART method of gaol setting for successful in recovery. Her gaol short time goal " loving me, finish self esteem list." Long time goal "race in with obstacle in April."  Bethann Punches 04/18/2015, 2:32 PM

## 2015-04-18 NOTE — Progress Notes (Signed)
Patient ID: Joan [name], female   DOB: 02/13/1981, 34 y.o.   MRN: [id number] [facility] MD Progress Note  04/18/2015 4:51 PM Joan [name]  MRN:  [id number] Subjective:   Patient is reporting partial improvement . She does state she is feeling " better today than I have been ", and reports she feels medications are " finally kicking in". She had a visit from her roommates , friends yesterday and states they were supportive, concerned and reassured her she could return to live with them after discharge. This visit was very reassuring and also contributed to patient feeling better.    Objective :  Patient was seen and case discussed with treatment team .  Today patient is improved compared to prior presentation. She is still anxious and somewhat depressed, but presents with noticeably improved range of affect, and is more optimistic about her discharge plans and overall improvement. PTSD symptoms are chronic but also seem to be improving gradually She denies medication side effects at this time. She is visible in milieu and has been going to groups . She plans to go to  [facility] , duration 2 weeks, after discharge, after which she plans to return to live with her friends as above and resume outpatient care locally. She has been going to groups and is visible on unit .   Marland Kitchen Principal Problem: MDD (major depressive disorder), recurrent episode, severe Diagnosis:   Patient Active Problem List   Diagnosis Date Noted  . Major depressive disorder, recurrent, severe without psychotic features [F33.2]   . MDD (major depressive disorder), recurrent episode, severe [F33.2] 04/11/2015  . PTSD (post-traumatic stress disorder) [F43.10] 04/11/2015   Total Time spent with patient: 25 minutes   Past Medical History:  Past Medical History  Diagnosis Date  . Diabetes mellitus without complication     diet controlled    Past Surgical History  Procedure Laterality Date  .  Tonsillectomy     Family History: History reviewed. No pertinent family history. Social History:  History  Alcohol Use No     History  Drug Use No    Social History   Social History  . Marital Status: Married    Spouse Name: N/A  . Number of Children: N/A  . Years of Education: N/A   Social History Main Topics  . Smoking status: Never Smoker   . Smokeless tobacco: None  . Alcohol Use: No  . Drug Use: No  . Sexual Activity: Not Asked   Other Topics Concern  . None   Social History Narrative   Additional History:    Sleep: Good   Appetite:  Good  Assessment:   Musculoskeletal: Strength & Muscle Tone: within normal limits Gait & Station: normal Patient leans: N/A  Psychiatric Specialty Exam: Physical Exam  Review of Systems  Constitutional: Negative.   HENT: Negative.   Eyes: Negative.   Respiratory: Negative.   Cardiovascular: Negative.   Gastrointestinal: Negative.   Genitourinary: Negative.   Musculoskeletal: Negative.   Skin: Negative.   Neurological: Negative.   Endo/Heme/Allergies: Negative.   Psychiatric/Behavioral: Positive for depression and suicidal ideas. The patient is nervous/anxious.    denies dizziness, lightheadedness, no vomiting   Blood pressure 126/82, pulse 122, temperature 97.8 F (36.6 C), temperature source Oral, resp. rate 18, height  (1.676 m), weight 195 lb (88.451 kg).Body mass index is 31.49 kg/(m^2).  General Appearance: Casual   Eye Contact::  Good  Speech:  Normal Rate  Volume:  Normal  Mood:  Improving , less depressed  Affect:   Less labile , improving   Thought Process:  Linear  Orientation:  Full (Time, Place, and Person)  Thought Content:  no hallucinations, no delusions, not internally preoccupied, ruminative about past stressors   Suicidal Thoughts:  No-   At this time patient denies any plan or intention of hurting self  On unit.   Homicidal Thoughts:  No  Memory:  recent and remote grossly intact    Judgement:  Fair  Insight:  Fair  Psychomotor Activity:  Normal  Concentration:  Good  Recall:  Good  Fund of Knowledge:Good  Language: Good  Akathisia:  Negative  Handed:  Right  AIMS (if indicated):     Assets:  Communication Skills Desire for Improvement Resilience  ADL's:  Intact  Cognition: WNL  Sleep:  Number of Hours: 6.75     Current Medications: Current Facility-Administered Medications  Medication Dose Route Frequency Provider Last Rate Last Dose  . acetaminophen (TYLENOL) tablet 1,000 mg  1,000 mg Oral Q6H PRN Kerry Hough, PA-C      . alum & mag hydroxide-simeth (MAALOX/MYLANTA) 200-200-20 MG/5ML suspension 30 mL  30 mL Oral Q4H PRN Kerry Hough, PA-C      . ARIPiprazole (ABILIFY) tablet 5 mg  5 mg Oral Daily Craige Cotta, MD   5 mg at 04/18/15 0759  . citalopram (CELEXA) tablet 20 mg  20 mg Oral Daily Craige Cotta, MD   20 mg at 04/18/15 0759  . hydrOXYzine (ATARAX/VISTARIL) tablet 25 mg  25 mg Oral Q6H PRN Thermon Leyland, NP   25 mg at 04/18/15 1101  . LORazepam (ATIVAN) tablet 0.5 mg  0.5 mg Oral Q6H PRN Rockey Situ Abdalla Naramore, MD      . magnesium hydroxide (MILK OF MAGNESIA) suspension 30 mL  30 mL Oral Daily PRN Kerry Hough, PA-C      . prazosin (MINIPRESS) capsule 2 mg  2 mg Oral QHS Craige Cotta, MD   2 mg at 04/17/15 2123  . traZODone (DESYREL) tablet 50 mg  50 mg Oral QHS PRN,MR X 1 Kerry Hough, PA-C   50 mg at 04/17/15 2123    Lab Results:  No results found for this or any previous visit (from the past 48 hour(s)).  Physical Findings: AIMS: Facial and Oral Movements Muscles of Facial Expression: None, normal Lips and Perioral Area: None, normal Jaw: None, normal Tongue: None, normal,Extremity Movements Upper (arms, wrists, hands, fingers): None, normal Lower (legs, knees, ankles, toes): None, normal, Trunk Movements Neck, shoulders, hips: None, normal, Overall Severity Severity of abnormal movements (highest score from questions  above): None, normal Incapacitation due to abnormal movements: None, normal Patient's awareness of abnormal movements (rate only patient's report): No Awareness, Dental Status Current problems with teeth and/or dentures?: No Does patient usually wear dentures?: No  CIWA:  CIWA-Ar Total: 1 COWS:  COWS Total Score: 2   Assessment - patient is gradually improving and in particular today reports feeling noticeably better, which she attributes to mediations and to reassuring , supportive visit from her friends/roommates last evening.  She is currently less anxious and less ruminative than she has been, less fearful about upcoming discharge and discharge plans . At this time tolerating medications well .   Treatment Plan Summary: Daily contact with patient to assess and evaluate symptoms and progress in treatment, Medication management, Plan inpatient  treatment  and medications as below  Continue  Minipress to 2  mgrs QHS to address PTSD related nightmares and improve sleep. Continue Celexa 20 mgrs QDAY to address depression and PTSD Continue  Ativan 0.5 mgrs Q 6 hours PRN for Anxiety as needed .  Continue Trazodone 50 mgrs QHS PRN to address Insomnia Continue Abilify 5 mgrs QDAY as augmentation to antidepressant and for mood stabilization.  Continue to provide milieu, groups , support conducive to crisis stabilization. Consider discharge soon  As she continues to make progress    Medical Decision Making:  Established Problem, Stable/Improving (1), Review of Psycho-Social Stressors (1), Review or order clinical lab tests (1) and Review of Medication Regimen & Side Effects (2)   Ivorie Uplinger 04/18/2015 4:51 PM

## 2015-04-18 NOTE — Progress Notes (Signed)
D: Pt presents appropriate in affect and anxious in mood. Pt recognizes that some of her anxiety is related to discharging on Thursday for outpatient therapy. Pt is excited about going and reports that the place will offer room and board. Pt reports that she is still having SI and that this is usual for her. Pt verbally contracts for safety. Pt is visible and active within the milieu. Pt reported some dizziness upon taking her trazodone. Pt was encouraged not to abruptly make transitions in postioning. Pt was also encouraged to drink fluids to maintain hr BP with her newly increased dose of trazodone. Pt was receptive to the information. A: Writer administered scheduled and prn medications to pt, per MD orders. Continued support and availability as needed was extended to this pt. Staff continue to monitor pt with q70min checks.  R: No adverse drug reactions noted. Pt receptive to treatment. Pt remains safe at this time.

## 2015-04-18 NOTE — [facility] Group Notes (Signed)
[facility] LCSW Group Therapy 04/18/2015 1:15 PM  Type of Therapy: Group Therapy- Feelings about Diagnosis  Participation Level: Active   Participation Quality:  Appropriate  Affect:  Appropriate  Cognitive: Alert and Oriented   Insight:  Developing   Engagement in Therapy: Developing/Improving and Engaged   Modes of Intervention: Clarification, Confrontation, Discussion, Education, Exploration, Limit-setting, Orientation, Problem-solving, Rapport Building, Dance movement psychotherapist, Socialization and Support  Description of Group:   This group will allow patients to explore their thoughts and feelings about diagnoses they have received. Patients will be guided to explore their level of understanding and acceptance of these diagnoses. Facilitator will encourage patients to process their thoughts and feelings about the reactions of others to their diagnosis, and will guide patients in identifying ways to discuss their diagnosis with significant others in their lives. This group will be process-oriented, with patients participating in exploration of their own experiences as well as giving and receiving support and challenge from other group members.  Summary of Progress/Problems:  Pt affect is much brighter than on previous days. Communicated to group that she has come to recognize that she has to work in recovery rather than just relying on medications. She expresses that having a support system is appropriate. She was able to articulate a plan for her recovery.   Therapeutic Modalities:   Cognitive Behavioral Therapy Solution Focused Therapy Motivational Interviewing Relapse Prevention Therapy  Chad Cordial, LCSWA 04/18/2015 4:36 PM

## 2015-04-18 NOTE — Progress Notes (Signed)
D. Pt present to the with bright affect and jovial mood today. Pt had a good night sleep last night, good appetite, hyper energy level and good concentration. Pt attended groups but came out in tears saying that the discussion brought back a lot memories. Pt requested for Vistaril, 25 mg PO was given at 1101. 30 min later pt reported feeling better. Her goal for today is "work on not flipping out when talking about going home." Pt reported that her depression was a 8, her hopelessness was a 10, and that her anxiety was a 9. Pt reported being negative SI/HI, no AH/VH noted. A: 15 min checks continued for patient safety. R: Pts safety maintained.

## 2015-04-18 NOTE — Tx Team (Addendum)
Interdisciplinary Treatment Plan Update (Adult) Date: 04/18/2015   Date: 04/18/2015 9:13 AM  Progress in Treatment:  Attending groups: Yes Participating in groups: Yes Taking medication as prescribed: Yes  Tolerating medication: Yes  Family/Significant othe contact made: Yes, with roommate Patient understands diagnosis: Continuing to assess Discussing patient identified problems/goals with staff: Yes  Medical problems stabilized or resolved: Yes  Denies suicidal/homicidal ideation: Yes Patient has not harmed self or Others: Yes   New problem(s) identified: None identified at this time.   Discharge Plan or Barriers: Pt will return to her friend's home and follow-up with outpatient resources  9/13: Pt is interested in going to [facility] when discharged.  Additional comments: n/a   Reason for Continuation of Hospitalization:  Depression Medication stabilization Suicidal ideation   Estimated length of stay: 1-2 days  Review of initial/current patient goals per problem list:   1.  Goal(s): Patient will participate in aftercare plan  Met:  Yes  Target date: 3-5 days from date of admission   As evidenced by: Patient will participate within aftercare plan AEB aftercare provider and housing plan at discharge being identified.   04/11/15: Pt will return home and follow-up with outpatient providers  2.  Goal (s): Patient will exhibit decreased depressive symptoms and suicidal ideations.  Met:  Adequate for DC  Target date: 3-5 days from date of admission   As evidenced by: Patient will utilize self rating of depression at 3 or below and demonstrate decreased signs of depression or be deemed stable for discharge by MD. 04/11/15: Pt was admitted with symptoms of depression, rating 10/10. Pt continues to present with flat affect and depressive symptoms.  Pt will demonstrate decreased symptoms of depression and rate depression at 3/10 or lower prior to discharge. 9/8: Goal  Progressing. 9/13: Pt continues to report depression at 10/10 and expresses continuous thoughts of hurting herself; denies SI. 9/14: Pt mood is much improved and she reports that her depression is now at 5/10. Denies SI. MD feels that Pt's symptoms have decreased to the point that they can be managed on an outpatient basis.  Attendees: Patient:    Family:    Physician: Dr. Parke Poisson; Dr. Sabra Heck 04/18/2015 9:39 AM   Nursing:   Loletta Specter, RN; Eulogio Bear, RN 04/18/2015 9:39 AM   Clinical Social Worker: Peri Maris, LCSWA 04/18/2015 9:39 AM   Other:    Other: Jake Bathe Liaison 04/18/2015 9:39 AM   Other: Lars Pinks, Case Manager 04/18/2015 9:39 AM   Other:    Other:         Peri Maris, [place] Social Work [phone]
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
## 2015-04-19 MED ORDER — HYDROXYZINE HCL 25 MG PO TABS: 25.0000 mg | ORAL_TABLET | Freq: Four times a day (QID) | ORAL | Status: DC | PRN

## 2015-04-19 MED ORDER — CITALOPRAM HYDROBROMIDE 20 MG PO TABS: 20.0000 mg | ORAL_TABLET | Freq: Every day | ORAL | Status: DC

## 2015-04-19 MED ORDER — TRAZODONE HCL 50 MG PO TABS: 50.0000 mg | ORAL_TABLET | Freq: Every evening | ORAL | Status: DC | PRN

## 2015-04-19 MED ORDER — ARIPIPRAZOLE 5 MG PO TABS: 5.0000 mg | ORAL_TABLET | Freq: Every day | ORAL | Status: DC

## 2015-04-19 MED ORDER — PRAZOSIN HCL 2 MG PO CAPS: 2.0000 mg | ORAL_CAPSULE | Freq: Every day | ORAL | Status: DC

## 2015-04-19 NOTE — Progress Notes (Signed)
  [facility] Adult Case Management Discharge Plan :  Will you be returning to the same living situation after discharge:  Yes,  Pt returning to her friend's home At discharge, do you have transportation home?: Yes,  Pt's friend, Babette Relic, to provide transportation Do you have the ability to pay for your medications: Yes,  Pt provided with prescriptions  Release of information consent forms completed and in the chart;  Patient's signature needed at discharge.  Patient to Follow up at: Follow-up Information    Follow up with [facility] On 05/09/2015.   Why:  at 4:20pm for medication management with Dr. [name].   Contact information:   [address] [place], [place] [address] Phone: [phone] Fax: [phone]      Follow up with [facility] On 04/21/2015.   Why:  at 6:00pm for therapy with [name].   Contact information:   [address] [place], [place] [address] [phone]      Follow up with [facility] .   Why:  Call at discharge to schedule assessment appointment. Records will be faxed to [facility] at discharge.    Contact information:   [address] [facility] [address] [place], [place] [address] [phone]      Patient denies SI/HI: Yes,  Pt denies    Safety Planning and Suicide Prevention discussed: Yes,  with friend, Tammy; see SPE note for further details  Have you used any form of tobacco in the last 30 days? (Cigarettes, Smokeless Tobacco, Cigars, and/or Pipes): No  Has patient been referred to the Quitline?: N/A patient is not a smoker  Elaina Hoops 04/19/2015, 9:39 AM

## 2015-04-19 NOTE — Plan of Care (Signed)
Problem: Diagnosis: Increased Risk For Suicide Attempt Goal: STG-Patient Will Comply With Medication Regime Outcome: Progressing Client is compliant with medications, takes medication without incidence.

## 2015-04-19 NOTE — [facility] Group Notes (Signed)
[facility] Note  04/19/2015 8:45 AM  Participation Quality: Alert, Appropriate and Oriented  Mood/Affect: Appropriate  Depression Rating: 5  Anxiety Rating: 9  Thoughts of Suicide: Pt denies SI/HI  Will you contract for safety? Yes  Current AVH: Pt denies  Plan for Discharge/Comments: Pt attended discharge planning group and actively participated in group. CSW discussed suicide prevention education with the group and encouraged them to discuss discharge planning and any relevant barriers. Pt expresses that she is feeling "hyped up" about discharge but feels ready to tackle challenges at home. Pt expresses that her depression levels have made significant improvement.  Transportation Means: Pt reports access to transportation  Supports: Roommates  Otilia Kareem Montez Morita, LCSWA 04/19/2015 9:22 AM

## 2015-04-19 NOTE — Progress Notes (Signed)
Recreation Therapy Notes  Date: 09.14.2016 Time: 9:30am  Location: [address] Group Room   Group Topic: Stress Management  Goal Area(s) Addresses:  Patient will actively participate in stress management techniques presented during session.   Behavioral Response: Appropriate   Intervention: Stress management techniques  Activity :  Deep Breathing and Progressive Body Scan. LRT provided instruction and demonstration on practice of Progressive Body Scan. Technique was coupled with deep breathing.   Education:  Stress Management, Discharge Planning.   Education Outcome: Acknowledges education  Clinical Observations/Feedback: Patient actively engaged in technique introduced, expressed no concerns and demonstrated ability to practice independently post d/c.    Joan [name], LRT/CTRS  Joan [name] L 04/19/2015 11:27 AM 

## 2015-04-19 NOTE — BHH Suicide Risk Assessment (Signed)
[facility] Discharge Suicide Risk Assessment   Demographic Factors:  Caucasian  Total Time spent with patient: 30 minutes  Musculoskeletal: Strength & Muscle Tone: within normal limits Gait & Station: normal Patient leans: N/A  Psychiatric Specialty Exam: Physical Exam  Review of Systems  Psychiatric/Behavioral: Negative for depression and suicidal ideas. The patient is not nervous/anxious and does not have insomnia.   All other systems reviewed and are negative.   Blood pressure 128/77, pulse 136, temperature 97.9 F (36.6 C), temperature source Oral, resp. rate 16, height 5\' 6"  (1.676 m), weight 88.451 kg (195 lb).Body mass index is 31.49 kg/(m^2).  General Appearance: Casual  Eye Contact::  Good  Speech:  Clear and Coherent409  Volume:  Normal  Mood:  Euthymic  Affect:  Appropriate  Thought Process:  Coherent  Orientation:  Full (Time, Place, and Person)  Thought Content:  WDL  Suicidal Thoughts:  No  Homicidal Thoughts:  No  Memory:  Immediate;   Fair Recent;   Fair Remote;   Fair  Judgement:  Fair  Insight:  Fair  Psychomotor Activity:  Normal  Concentration:  Fair  Recall:  [employer] of Knowledge:Fair  Language: Fair  Akathisia:  No  Handed:  Right  AIMS (if indicated):     Assets:  Communication Skills Desire for Improvement  Sleep:  Number of Hours: 6.25  Cognition: WNL  ADL's:  Intact   Have you used any form of tobacco in the last 30 days? (Cigarettes, Smokeless Tobacco, Cigars, and/or Pipes): No  Has this patient used any form of tobacco in the last 30 days? (Cigarettes, Smokeless Tobacco, Cigars, and/or Pipes) No  Mental Status Per Nursing Assessment::   On Admission:     Current Mental Status by Physician: Pt denies SI , looking forward to going to rehab program, denies any other concerns.  Loss Factors: NA  Historical Factors: Impulsivity  Risk Reduction Factors:   Positive social support  Continued Clinical Symptoms:  Previous Psychiatric  Diagnoses and Treatments  Cognitive Features That Contribute To Risk:  None    Suicide Risk:  Minimal: No identifiable suicidal ideation.  Patients presenting with no risk factors but with morbid ruminations; may be classified as minimal risk based on the severity of the depressive symptoms  Principal Problem: MDD (major depressive disorder), recurrent episode, severe Discharge Diagnoses:  Patient Active Problem List   Diagnosis Date Noted  . Major depressive disorder, recurrent, severe without psychotic features [F33.2]   . MDD (major depressive disorder), recurrent episode, severe [F33.2] 04/11/2015  . PTSD (post-traumatic stress disorder) [F43.10] 04/11/2015    Follow-up Information    Follow up with [facility] On 04/17/2015.   Why:  at 11:00am for medication management with Dr. Felisa Bonier information:   [address] [place], [place] [address] Phone: [phone] Fax: [phone]      Follow up with [facility] On 04/18/2015.   Why:  Therapy appt with [name] on Tuesday Sept. 13th at 7pm. Please call office if you need to reschedule.    Contact information:   [address] [place], [place] [address] [phone]      Follow up with [facility] .   Why:  Call at discharge to schedule assessment appointment. Records will be faxed to [facility] at discharge.    Contact information:   [address] [place], [place] [address] [phone]      Plan Of Care/Follow-up recommendations:  Activity:  no restrictions Diet:  regular Tests:  as needed Other:  follow up with after care  Is patient on multiple antipsychotic therapies at discharge:  No   Has Patient had three or more failed trials of antipsychotic monotherapy by history:  No  Recommended Plan for Multiple Antipsychotic Therapies: NA    Dellanira Dillow md 04/19/2015, 8:40 AM
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
## 2015-04-19 NOTE — Progress Notes (Signed)
Pt discharged home with friend. Pt was ambulatory and well, no complain reported. All papers and prescriptions were given and valuables returned. Verbel  understanding expressed. Denies SI/HI and A/VH. Pt given opportunity to express concerns and asked questions.

## 2015-04-19 NOTE — Discharge Summary (Signed)
Physician Discharge Summary Note  Patient:  Joan [name] is an 34 y.o., female MRN:  [id number] DOB:  09/29/1980 Patient phone:  [phone] (home)  Patient address:   [address] [place] [address],  Total Time spent with patient: 30 minutes  Date of Admission:  04/11/2015 Date of Discharge: 04/19/2015  Reason for Admission:  Depression  Principal Problem: MDD (major depressive disorder), recurrent episode, severe Discharge Diagnoses: Patient Active Problem List   Diagnosis Date Noted  . Major depressive disorder, recurrent, severe without psychotic features [F33.2]   . MDD (major depressive disorder), recurrent episode, severe [F33.2] 04/11/2015  . PTSD (post-traumatic stress disorder) [F43.10] 04/11/2015    Musculoskeletal: Strength & Muscle Tone: within normal limits Gait & Station: normal Patient leans: N/A  Psychiatric Specialty Exam: Physical Exam  Vitals reviewed. Psychiatric: Her mood appears anxious. Thought content is not paranoid. She does not exhibit a depressed mood. She expresses no homicidal and no suicidal ideation.    Review of Systems  All other systems reviewed and are negative.   Blood pressure 128/77, pulse 136, temperature 97.9 F (36.6 C), temperature source Oral, resp. rate 16, height 5\' 6"  (1.676 m), weight 88.451 kg (195 lb).Body mass index is 31.49 kg/(m^2).   General Appearance: Casual  Eye Contact:: Good  Speech: Clear and Coherent  Volume: Normal  Mood: Euthymic  Affect: Appropriate  Thought Process: Coherent  Orientation: Full (Time, Place, and Person)  Thought Content: WDL  Suicidal Thoughts: No  Homicidal Thoughts: No  Memory: Immediate; Fair Recent; Fair Remote; Fair  Judgement: Fair  Insight: Fair  Psychomotor Activity: Normal  Concentration: Fair  Recall: [employer] of Knowledge:Fair  Language: Fair  Akathisia: No  Handed: Right  AIMS (if indicated):    Assets:  Communication Skills Desire for Improvement  Sleep: Number of Hours: 6.25  Cognition: WNL  ADL's: Intact       Have you used any form of tobacco in the last 30 days? (Cigarettes, Smokeless Tobacco, Cigars, and/or Pipes): No  Has this patient used any form of tobacco in the last 30 days? (Cigarettes, Smokeless Tobacco, Cigars, and/or Pipes) No  Past Medical History:  Past Medical History  Diagnosis Date  . Diabetes mellitus without complication     diet controlled    Past Surgical History  Procedure Laterality Date  . Tonsillectomy     Family History: History reviewed. No pertinent family history. Social History:  History  Alcohol Use No     History  Drug Use No    Social History   Social History  . Marital Status: Married    Spouse Name: N/A  . Number of Children: N/A  . Years of Education: N/A   Social History Main Topics  . Smoking status: Never Smoker   . Smokeless tobacco: None  . Alcohol Use: No  . Drug Use: No  . Sexual Activity: Not Asked   Other Topics Concern  . None   Social History Narrative   Risk to Self: Is patient at risk for suicide?: Yes What has been your use of drugs/alcohol within the last 12 months?: Pt denies Risk to Others:   Prior Inpatient Therapy:   Prior Outpatient Therapy:    Level of Care:  OP  Hospital Course:  TENILLE MORRILL was admitted for MDD (major depressive disorder), recurrent episode, severe and crisis management.  She was treated discharged with the medications listed below under Medication List.  Medical problems were identified and treated as  needed.  Home medications were restarted as appropriate.  Improvement was monitored by observation and Andrey Spearman daily report of symptom reduction.  Emotional and mental status was monitored by daily self-inventory reports completed by Andrey Spearman and clinical staff.         CARMINA WALLE was evaluated by the treatment team for stability and plans for  continued recovery upon discharge.  SHAKHIA GRAMAJO motivation was an integral factor for scheduling further treatment.  Employment, transportation, bed availability, health status, family support, and any pending legal issues were also considered during her hospital stay. She was offered further treatment options upon discharge including but not limited to Residential, Intensive Outpatient, and Outpatient treatment.  HANNIA MATCHETT will follow up with the services as listed below under Follow Up Information.     Upon completion of this admission the patient was both mentally and medically stable for discharge denying suicidal/homicidal ideation, auditory/visual/tactile hallucinations, delusional thoughts and paranoia.      Consults:  psychiatry  Significant Diagnostic Studies:  labs: per ED  Discharge Vitals:   Blood pressure 128/77, pulse 136, temperature 97.9 F (36.6 C), temperature source Oral, resp. rate 16, height  (1.676 m), weight 88.451 kg (195 lb). Body mass index is 31.49 kg/(m^2). Lab Results:   No results found for this or any previous visit (from the past 72 hour(s)).  Physical Findings: AIMS: Facial and Oral Movements Muscles of Facial Expression: None, normal Lips and Perioral Area: None, normal Jaw: None, normal Tongue: None, normal,Extremity Movements Upper (arms, wrists, hands, fingers): None, normal Lower (legs, knees, ankles, toes): None, normal, Trunk Movements Neck, shoulders, hips: None, normal, Overall Severity Severity of abnormal movements (highest score from questions above): None, normal Incapacitation due to abnormal movements: None, normal Patient's awareness of abnormal movements (rate only patient's report): No Awareness, Dental Status Current problems with teeth and/or dentures?: No Does patient usually wear dentures?: No  CIWA:  CIWA-Ar Total: 1 COWS:  COWS Total Score: 2   See Psychiatric Specialty Exam and Suicide Risk Assessment  completed by Attending Physician prior to discharge.  Discharge destination:  Home  Is patient on multiple antipsychotic therapies at discharge:  No   Has Patient had three or more failed trials of antipsychotic monotherapy by history:  No  Recommended Plan for Multiple Antipsychotic Therapies: NA    Medication List    STOP taking these medications        acetaminophen 500 MG tablet  Commonly known as:  TYLENOL     BELSOMRA 15 MG Tabs  Generic drug:  Suvorexant     clonazePAM 0.5 MG disintegrating tablet  Commonly known as:  KLONOPIN     ipratropium 0.06 % nasal spray  Commonly known as:  ATROVENT     multivitamin tablet     oxyCODONE-acetaminophen 5-325 MG per tablet  Commonly known as:  PERCOCET/ROXICET     PARoxetine 30 MG tablet  Commonly known as:  PAXIL     predniSONE 10 MG tablet  Commonly known as:  DELTASONE      TAKE these medications      Indication   ARIPiprazole 5 MG tablet  Commonly known as:  ABILIFY  Take 1 tablet (5 mg total) by mouth daily.   Indication:  Major Depressive Disorder     citalopram 20 MG tablet  Commonly known as:  CELEXA  Take 1 tablet (20 mg total) by mouth daily.   Indication:  Depression, Posttraumatic Stress Disorder  hydrOXYzine 25 MG tablet  Commonly known as:  ATARAX/VISTARIL  Take 1 tablet (25 mg total) by mouth every 6 (six) hours as needed for anxiety.   Indication:  Anxiety Neurosis     prazosin 2 MG capsule  Commonly known as:  MINIPRESS  Take 1 capsule (2 mg total) by mouth at bedtime.   Indication:  Nightmares from PTSD     traZODone 50 MG tablet  Commonly known as:  DESYREL  Take 1 tablet (50 mg total) by mouth at bedtime as needed and may repeat dose one time if needed for sleep.   Indication:  Trouble Sleeping           Follow-up Information    Follow up with [facility] On 05/09/2015.   Why:  at 4:20pm for medication management with Dr. [name].   Contact information:   [address] [place], [place] [address] Phone: [phone] Fax: [phone]      Follow up with [facility] On 04/21/2015.   Why:  at 6:00pm for therapy with [name].   Contact information:   [address] [place], [place] [address] [phone]      Follow up with [facility] .   Why:  Call at discharge to schedule assessment appointment. Records will be faxed to [facility] at discharge.    Contact information:   [address] [place], [place] [address] [phone]      Follow-up recommendations:  Activity:  as tol Diet:  as tol  Comments:  1.  Take all your medications as prescribed.              2.  Report any adverse side effects to outpatient provider.                       3.  Patient instructed to not use alcohol or illegal drugs while on prescription medicines.            4.  In the event of worsening symptoms, instructed patient to call 911, the crisis hotline or go to nearest emergency room for evaluation of symptoms.  Total Discharge Time: 30 min  Signed: Velna Hatchet May Sonny Anthes AGNP-BC 04/19/2015, 12:50 PM

## 2015-04-19 NOTE — Plan of Care (Signed)
Problem: Diagnosis: Increased Risk For Suicide Attempt Goal: LTG-Patient Will Report Improved Mood and Deny Suicidal LTG (by discharge) Patient will report improved mood and deny suicidal ideation.  Outcome: Progressing Client reports "this is the first day I hadn't thought of hurting myself" "really feel positive about myself"
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
## 2015-04-19 NOTE — Plan of Care (Signed)
Problem: Diagnosis: Increased Risk For Suicide Attempt Goal: LTG-Patient Will Show Positive Response to Medication LTG (by discharge) : Patient will show positive response to medication and will participate in the development of the discharge plan.  Outcome: Progressing Client denies SHI, safe on the units AEB q38min safety checks, denies SHI.

## 2015-05-03 ENCOUNTER — Inpatient Hospital Stay (HOSPITAL_COMMUNITY)
Admission: AD | Admit: 2015-05-03 | Discharge: 2015-05-10 | DRG: 885 | Disposition: A | Discharge status: Home / Self Care | Payer: BLUE CROSS/BLUE SHIELD | Source: Intra-hospital | Attending: Psychiatry | Admitting: Psychiatry

## 2015-05-03 ENCOUNTER — Encounter (HOSPITAL_COMMUNITY): Payer: Self-pay | Admitting: Emergency Medicine

## 2015-05-03 ENCOUNTER — Emergency Department (HOSPITAL_COMMUNITY)
Admission: EM | Admit: 2015-05-03 | Discharge: 2015-05-03 | Disposition: A | Discharge status: Other Acute Inpatient Hospital | Payer: BLUE CROSS/BLUE SHIELD | Attending: Emergency Medicine | Admitting: Emergency Medicine

## 2015-05-03 ENCOUNTER — Encounter (HOSPITAL_COMMUNITY): Payer: Self-pay | Admitting: Behavioral Health

## 2015-05-03 DIAGNOSIS — F332 Major depressive disorder, recurrent severe without psychotic features: Principal | ICD-10-CM | POA: Diagnosis present

## 2015-05-03 DIAGNOSIS — Z880 Allergy status to penicillin: Secondary | ICD-10-CM | POA: Diagnosis not present

## 2015-05-03 DIAGNOSIS — E119 Type 2 diabetes mellitus without complications: Secondary | ICD-10-CM | POA: Diagnosis present

## 2015-05-03 DIAGNOSIS — F32A Depression, unspecified: Secondary | ICD-10-CM

## 2015-05-03 DIAGNOSIS — R45851 Suicidal ideations: Secondary | ICD-10-CM | POA: Diagnosis present

## 2015-05-03 DIAGNOSIS — F329 Major depressive disorder, single episode, unspecified: Secondary | ICD-10-CM | POA: Insufficient documentation

## 2015-05-03 DIAGNOSIS — Z79899 Other long term (current) drug therapy: Secondary | ICD-10-CM | POA: Diagnosis not present

## 2015-05-03 DIAGNOSIS — F4310 Post-traumatic stress disorder, unspecified: Secondary | ICD-10-CM | POA: Diagnosis not present

## 2015-05-03 DIAGNOSIS — G4700 Insomnia, unspecified: Secondary | ICD-10-CM | POA: Diagnosis present

## 2015-05-03 DIAGNOSIS — F419 Anxiety disorder, unspecified: Secondary | ICD-10-CM | POA: Diagnosis present

## 2015-05-03 HISTORY — DX: Auditory hallucinations: R44.0

## 2015-05-03 LAB — COMPREHENSIVE METABOLIC PANEL
ALT: 29 U/L (ref 14–54)
AST: 25 U/L (ref 15–41)
Albumin: 4.9 g/dL (ref 3.5–5.0)
Alkaline Phosphatase: 50 U/L (ref 38–126)
Anion gap: 10 (ref 5–15)
BUN: 21 mg/dL — ABNORMAL HIGH (ref 6–20)
CO2: 25 mmol/L (ref 22–32)
Calcium: 9.7 mg/dL (ref 8.9–10.3)
Chloride: 103 mmol/L (ref 101–111)
Creatinine, Ser: 0.73 mg/dL (ref 0.44–1.00)
GFR calc Af Amer: >60 mL/min (ref >60)
GFR calc non Af Amer: >60 mL/min (ref >60)
Glucose, Bld: 123 mg/dL — ABNORMAL HIGH (ref 65–99)
Potassium: 4.5 mmol/L (ref 3.5–5.1)
Sodium: 138 mmol/L (ref 135–145)
Total Bilirubin: 0.5 mg/dL (ref 0.3–1.2)
Total Protein: 7.7 g/dL (ref 6.5–8.1)

## 2015-05-03 LAB — CBC
HCT: 44.8 % (ref 36.0–46.0)
Hemoglobin: 14.9 g/dL (ref 12.0–15.0)
MCH: 29.1 pg (ref 26.0–34.0)
MCHC: 33.3 g/dL (ref 30.0–36.0)
MCV: 87.5 fL (ref 78.0–100.0)
Platelets: 274 10*3/uL (ref 150–400)
RBC: 5.12 MIL/uL — ABNORMAL HIGH (ref 3.87–5.11)
RDW: 13.2 % (ref 11.5–15.5)
WBC: 6 10*3/uL (ref 4.0–10.5)

## 2015-05-03 LAB — RAPID URINE DRUG SCREEN, HOSP PERFORMED
Amphetamines: NOT DETECTED
Barbiturates: NOT DETECTED
Benzodiazepines: NOT DETECTED
Cocaine: NOT DETECTED
Opiates: NOT DETECTED
Tetrahydrocannabinol: NOT DETECTED

## 2015-05-03 LAB — SALICYLATE LEVEL: Salicylate Lvl: <4 mg/dL (ref 2.8–30.0)

## 2015-05-03 LAB — ETHANOL: Alcohol, Ethyl (B): <5 mg/dL (ref <5)

## 2015-05-03 LAB — ACETAMINOPHEN LEVEL: Acetaminophen (Tylenol), Serum: <10 ug/mL — ABNORMAL LOW (ref 10–30)

## 2015-05-03 MED ORDER — ALUM & MAG HYDROXIDE-SIMETH 200-200-20 MG/5ML PO SUSP: 30.0000 mL | ORAL | Status: DC | PRN

## 2015-05-03 MED ORDER — ZOLPIDEM TARTRATE 5 MG PO TABS: 5.0000 mg | ORAL_TABLET | Freq: Every evening | ORAL | Status: DC | PRN

## 2015-05-03 MED ORDER — CITALOPRAM HYDROBROMIDE 20 MG PO TABS: 20.0000 mg | ORAL_TABLET | Freq: Every day | ORAL | Status: DC

## 2015-05-03 MED ORDER — CITALOPRAM HYDROBROMIDE 20 MG PO TABS
20.0000 mg | ORAL_TABLET | Freq: Every day | ORAL | Status: DC
  Administered 2015-05-04 – 2015-05-10 (×7): 20 mg via ORAL
  Filled 2015-05-03 (×10): qty 1

## 2015-05-03 MED ORDER — ACETAMINOPHEN 325 MG PO TABS
650.0000 mg | ORAL_TABLET | Freq: Four times a day (QID) | ORAL | Status: DC | PRN
  Administered 2015-05-08 – 2015-05-10 (×3): 650 mg via ORAL
  Filled 2015-05-03 (×3): qty 2

## 2015-05-03 MED ORDER — ARIPIPRAZOLE 5 MG PO TABS
5.0000 mg | ORAL_TABLET | Freq: Every day | ORAL | Status: DC
  Administered 2015-05-04: 5 mg via ORAL
  Filled 2015-05-03 (×4): qty 1

## 2015-05-03 MED ORDER — MAGNESIUM HYDROXIDE 400 MG/5ML PO SUSP: 30.0000 mL | Freq: Every day | ORAL | Status: DC | PRN

## 2015-05-03 MED ORDER — ARIPIPRAZOLE 5 MG PO TABS: 5.0000 mg | ORAL_TABLET | Freq: Every day | ORAL | Status: DC

## 2015-05-03 MED ORDER — TRAZODONE HCL 50 MG PO TABS
50.0000 mg | ORAL_TABLET | Freq: Every evening | ORAL | Status: DC | PRN
  Administered 2015-05-03 – 2015-05-06 (×4): 50 mg via ORAL
  Filled 2015-05-03 (×2): qty 1

## 2015-05-03 MED ORDER — HYDROXYZINE HCL 25 MG PO TABS: 25.0000 mg | ORAL_TABLET | Freq: Four times a day (QID) | ORAL | Status: DC | PRN

## 2015-05-03 MED ORDER — PRAZOSIN HCL 2 MG PO CAPS
2.0000 mg | ORAL_CAPSULE | Freq: Every day | ORAL | Status: DC
  Administered 2015-05-03 – 2015-05-06 (×4): 2 mg via ORAL
  Filled 2015-05-03 (×3): qty 1
  Filled 2015-05-03 (×2): qty 2
  Filled 2015-05-03 (×3): qty 1

## 2015-05-03 MED ORDER — ACETAMINOPHEN 325 MG PO TABS: 650.0000 mg | ORAL_TABLET | ORAL | Status: DC | PRN

## 2015-05-03 MED ORDER — HYDROXYZINE HCL 25 MG PO TABS
25.0000 mg | ORAL_TABLET | Freq: Four times a day (QID) | ORAL | Status: DC | PRN
  Administered 2015-05-04 – 2015-05-08 (×6): 25 mg via ORAL
  Filled 2015-05-03 (×6): qty 1

## 2015-05-03 MED ORDER — PRAZOSIN HCL 2 MG PO CAPS
2.0000 mg | ORAL_CAPSULE | Freq: Every day | ORAL | Status: DC
  Filled 2015-05-03: qty 1

## 2015-05-03 NOTE — ED Notes (Signed)
Per pt, states she has been hearing voices at night, states increased SI for a day or two-was hospitalized for same 3 weeks ago

## 2015-05-03 NOTE — BH Assessment (Signed)
Consulted with Dr. Jannifer Franklin who recommends inpatient treatment on [address] at this time. Informed patients nurse and EDP of disposition, requested bed from Berneice Heinrich, RN, [facility].   Davina Poke, LCSW Therapeutic Triage Specialist  Health 05/03/2015 10:38 AM
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
## 2015-05-03 NOTE — Tx Team (Signed)
Initial Interdisciplinary Treatment Plan   PATIENT STRESSORS: Loss of marriage Marital or family conflict   PATIENT STRENGTHS: Ability for insight Capable of independent living Motivation for treatment/growth   PROBLEM LIST: Problem List/Patient Goals Date to be addressed Date deferred Reason deferred Estimated date of resolution  "opening up more instead of shutting down" 05/03/15     "decrease thoughts of hurting self" 05/03/15                                                DISCHARGE CRITERIA:  Ability to meet basic life and health needs Adequate post-discharge living arrangements Improved stabilization in mood, thinking, and/or behavior  PRELIMINARY DISCHARGE PLAN: Attend aftercare/continuing care group Attend PHP/IOP  PATIENT/FAMIILY INVOLVEMENT: This treatment plan has been presented to and reviewed with the patient, Joan [name], and/or family member.  The patient and family have been given the opportunity to ask questions and make suggestions.  [name] L 05/03/2015, 2:54 PM

## 2015-05-03 NOTE — ED Provider Notes (Addendum)
CSN: [id number]     Arrival date & time 05/03/15  5621 History   First MD Initiated Contact with Patient 05/03/15 1009     Chief Complaint  Patient presents with  . Suicidal     (Consider location/radiation/quality/duration/timing/severity/associated sxs/prior Treatment) HPI  34 year old female presents with increased suicidal thoughts, depression, and auditory hallucinations. States she has never had the auditory hallucinations before, although it is listed in her past medical history. She is not regular as who the voices but he keeps saying her name. She was discharged from behavioral health on 9/14. Felt fine until last night, all the symptoms recurred. Recently got over an upper restaurant infection but now feels well. No chest pain. No thoughts of hurting others but does have a plan to cut herself if she cannot feel better.  Past Medical History  Diagnosis Date  . Diabetes mellitus without complication     diet controlled  . Auditory hallucinations    Past Surgical History  Procedure Laterality Date  . Tonsillectomy     No family history on file. Social History  Substance Use Topics  . Smoking status: Never Smoker   . Smokeless tobacco: None  . Alcohol Use: No   OB History    No data available     Review of Systems  Respiratory: Positive for cough.   Psychiatric/Behavioral: Positive for suicidal ideas, hallucinations and dysphoric mood.  All other systems reviewed and are negative.     Allergies  Ciprofloxacin; Ibuprofen; Penicillins; and Sulfa antibiotics  Home Medications   Prior to Admission medications   Medication Sig Start Date End Date Taking? Authorizing Provider  ARIPiprazole (ABILIFY) 5 MG tablet Take 1 tablet (5 mg total) by mouth daily. 04/19/15  Yes Adonis Brook, NP  citalopram (CELEXA) 20 MG tablet Take 1 tablet (20 mg total) by mouth daily. 04/19/15  Yes Adonis Brook, NP  hydrOXYzine (ATARAX/VISTARIL) 25 MG tablet Take 1 tablet (25 mg total) by  mouth every 6 (six) hours as needed for anxiety. 04/19/15  Yes Adonis Brook, NP  prazosin (MINIPRESS) 2 MG capsule Take 1 capsule (2 mg total) by mouth at bedtime. 04/19/15  Yes Adonis Brook, NP  traZODone (DESYREL) 50 MG tablet Take 1 tablet (50 mg total) by mouth at bedtime as needed and may repeat dose one time if needed for sleep. 04/19/15  Yes Adonis Brook, NP   BP 176/93 mmHg  Pulse 82  Temp(Src) 98.6 F (37 C) (Oral)  Resp 18  SpO2 100%  LMP 04/26/2015 Physical Exam  Constitutional: She is oriented to person, place, and time. She appears well-developed and well-nourished.  HENT:  Head: Normocephalic and atraumatic.  Right Ear: External ear normal.  Left Ear: External ear normal.  Nose: Nose normal.  Eyes: Right eye exhibits no discharge. Left eye exhibits no discharge.  Cardiovascular: Normal rate, regular rhythm and normal heart sounds.   Pulmonary/Chest: Effort normal and breath sounds normal.  Abdominal: Soft. She exhibits no distension. There is no tenderness.  Neurological: She is alert and oriented to person, place, and time.  Skin: Skin is warm and dry.  Psychiatric: She has a normal mood and affect. Her speech is normal and behavior is normal. She expresses suicidal ideation.  Nursing note and vitals reviewed.   ED Course  Procedures (including critical care time) Labs Review Labs Reviewed  COMPREHENSIVE METABOLIC PANEL - Abnormal; Notable for the following:    Glucose, Bld 123 (*)    BUN 21 (*)    All  other components within normal limits  ACETAMINOPHEN LEVEL - Abnormal; Notable for the following:    Acetaminophen (Tylenol), Serum <10 (*)    All other components within normal limits  CBC - Abnormal; Notable for the following:    RBC 5.12 (*)    All other components within normal limits  ETHANOL  SALICYLATE LEVEL  URINE RAPID DRUG SCREEN, HOSP PERFORMED    Imaging Review No results found. I have personally reviewed and evaluated these images and lab  results as part of my medical decision-making.   EKG Interpretation None      MDM   Final diagnoses:  Depression    Patient has been accepted to behavioral health. She is medically cleared. Admit to psychiatry for further depression treatment.    Pricilla Loveless, MD 05/03/15 1642  Pricilla Loveless, MD 06/08/15 [phone]

## 2015-05-03 NOTE — BHH Group Notes (Signed)
BHH LCSW Group Therapy 05/03/2015 1:15 PM  Type of Therapy: Group Therapy- Emotion Regulation  Participation Level: Active   Participation Quality:  Appropriate  Affect: Appropriate  Cognitive: Alert and Oriented   Insight:  Developing/Improving  Engagement in Therapy: Developing/Improving and Engaged   Modes of Intervention: Clarification, Confrontation, Discussion, Education, Exploration, Limit-setting, Orientation, Problem-solving, Rapport Building, Dance movement psychotherapist, Socialization and Support  Summary of Progress/Problems: The topic for group today was emotional regulation. This group focused on both positive and negative emotion identification and allowed group members to process ways to identify feelings, regulate negative emotions, and find healthy ways to manage internal/external emotions. Group members were asked to reflect on a time when their reaction to an emotion led to a negative outcome and explored how alternative responses using emotion regulation would have benefited them. Group members were also asked to discuss a time when emotion regulation was utilized when a negative emotion was experienced. Pt identified depression as a difficult emotion to regulate because she feels like it is uncontrollable at times. Pt identified triggers related to her trauma but not her depression. Pt reports that her warning sign is her tendency to "shut down" with her supports and not reach out.  Chad Cordial, LCSWA 05/03/2015 5:05 PM

## 2015-05-03 NOTE — ED Notes (Signed)
Provided pt with crackers and Ginger ale

## 2015-05-03 NOTE — ED Notes (Signed)
Report given-will transfer to [facility]

## 2015-05-03 NOTE — Progress Notes (Signed)
Admission note: Pt reports AH telling her to kill herself. Pt reports hx of cutting, last time cutting two weeks ago. Pt reports that she spoke to her pastor about endorsing suicidal thoughts along with AH and was encouraged to seek tx. Pt denies suicidal thoughts at this time. Pt verbally contracts for safety and verbalized understanding. Pt b/p elevated during admission process, pt denies hx of HTN, pt reported that it's d/t her feeling anxious. Pt denies drinking alcohol. Pt denies using illegal substances. Pt compliant with taking home meds. Pt skin assessed, v/s assessed and required documents signed.

## 2015-05-03 NOTE — Progress Notes (Signed)
D: Patient in the dayroom on approach.  Patient does appears anxious and fidgety.  Patient states she is adjusting to being on the unit.  Patient states she was here at [facility] 2 weeks ago and states everything was fine at home until she started having night terrors again.  Patient states she became more depressed and began having constant suicidal ideations.  Patient states she is passive SI but verbally contracts for safety.  Patient denies HI and denies AVH.   A: Staff to monitor Q 15 mins for safety.  Encouragement and support offered.  Scheduled medications administered per orders.  Trazodone administered prn for sleep. R: Patient remains safe on the unit.  Patient attended group tonight.  Patient visible on the unit and interacting with peers.  Patient taking administered medications.

## 2015-05-03 NOTE — Progress Notes (Signed)
EKG COMPLETED AND PUT ON MD DESK FOR REVIEW.  

## 2015-05-03 NOTE — BHH Counselor (Signed)
Patient has been accepted to [facility] 401-1 per Berneice Heinrich, RN, [facility]. Dr. [name] attending. Patient signed voluntary consent for treatment and ROI and states that she does not have any questions or concerns at this time. Provided paperwork to patients nurse and faxed to [facility]. Informed patients nurse and EDP of admission to [facility].   Davina Poke, LCSW Therapeutic Triage Specialist [facility] 05/03/2015 12:08 PM'
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
## 2015-05-04 ENCOUNTER — Encounter (HOSPITAL_COMMUNITY): Payer: Self-pay | Admitting: Psychiatry

## 2015-05-04 DIAGNOSIS — F332 Major depressive disorder, recurrent severe without psychotic features: Principal | ICD-10-CM

## 2015-05-04 DIAGNOSIS — F4310 Post-traumatic stress disorder, unspecified: Secondary | ICD-10-CM

## 2015-05-04 LAB — PREGNANCY, URINE: Preg Test, Ur: NEGATIVE

## 2015-05-04 MED ORDER — ARIPIPRAZOLE 10 MG PO TABS
10.0000 mg | ORAL_TABLET | Freq: Every day | ORAL | Status: DC
  Administered 2015-05-05 – 2015-05-07 (×3): 10 mg via ORAL
  Filled 2015-05-04 (×5): qty 1

## 2015-05-04 NOTE — Tx Team (Signed)
Interdisciplinary Treatment Plan Update (Adult) Date: 05/04/2015   Date: 05/04/2015 1:40 PM  Progress in Treatment:  Attending groups: Yes  Participating in groups: Yes  Taking medication as prescribed: Yes  Tolerating medication: Yes  Family/Significant othe contact made: No, CSW attempting to make contact with roommates Patient understands diagnosis: Yes Discussing patient identified problems/goals with staff: Yes  Medical problems stabilized or resolved: Yes  Denies suicidal/homicidal ideation: Yes Patient has not harmed self or Others: Yes   New problem(s) identified: None identified at this time.   Discharge Plan or Barriers: Pt will return home and follow up with [facility]  Additional comments: n/a   Reason for Continuation of Hospitalization:  Anxiety Depression Medication stabilization Suicidal ideation  Estimated length of stay: 3-5 days  Review of initial/current patient goals per problem list:   1.  Goal(s): Patient will participate in aftercare plan  Met:  Yes  Target date: 3-5 days from date of admission   As evidenced by: Patient will participate within aftercare plan AEB aftercare provider and housing plan at discharge being identified.   05/04/15: Pt will return home and follow-up with [facility]  2.  Goal (s): Patient will exhibit decreased depressive symptoms and suicidal ideations.  Met:  No  Target date: 3-5 days from date of admission   As evidenced by: Patient will utilize self rating of depression at 3 or below and demonstrate decreased signs of depression or be deemed stable for discharge by MD. 05/04/15: Pt was admitted with symptoms of depression, rating 10/10. Pt continues to present with flat affect and depressive symptoms.  Pt will demonstrate decreased symptoms of depression and rate depression at 3/10 or lower prior to discharge.  3.  Goal(s): Patient will demonstrate decreased signs and symptoms of anxiety.  Met:   No  Target date: 3-5 days from date of admission   As evidenced by: Patient will utilize self rating of anxiety at 3 or below and demonstrated decreased signs of anxiety, or be deemed stable for discharge by MD 05/04/15: Pt was admitted with increased levels of anxiety and is currently rating those symptoms highly. Pt will demonstrated decreased symptoms of anxiety and rate it at 3/10 prior to d/c.  Attendees:  Patient:    Family:    Physician: Dr. Parke Poisson, MD  05/04/2015 1:40 PM  Nursing: Lars Pinks, RN Case manager  05/04/2015 1:40 PM  Clinical Social Worker Peri Maris, Latanya Presser, MSW 05/04/2015 1:40 PM  Other: Lucinda Dell, Beverly Sessions Liasion 05/04/2015 1:40 PM  Clinical:  Kerby Nora, RN 05/04/2015 1:40 PM  Other: , RN Charge Nurse 05/04/2015 1:40 PM  Other:     Peri Maris, [place] MSW
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
## 2015-05-04 NOTE — Progress Notes (Signed)
D: Joan [name] has been calm and cooperative. She admits passive SI ("always") and some thoughts of cutting, but she contracts for safety. She denies HI but says she sometimes hears a voice and sees her father when he's not there. She has been present in the milieu and interacting appropriately with peers. Presently, she is awaiting visitors after taking a PRN because of anxiety r/t the visit.  A: Meds given as ordered. Q15 safety checks maintained. Support/encouragement offered. R: Pt remains free from harm and continues with treatment. Will continue to monitor for needs/safety.
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
## 2015-05-04 NOTE — Progress Notes (Signed)
D: Patient in the hallway on approach.  Patient states, "I had a rough day."  Patient states she has been emotional all day after talking to the doctor.  Patient states she and the doctor talked about things in her past but patient did not elaborate.  Patient states he goal for today was to, "Love me more and write down 40 things I love about myself, I got 20."  Patient states she is still working on her goal for today.  Patient denies SI/HI and denies AVH.   A: Staff to monitor Q 15 mins for safety.  Encouragement and support offered.  Scheduled medications administered per orders.  Trazodone administered prn for sleep. R: Patient remains safe on the unit.  Patient attended group tonight.  Patient visible on the unit and interacting with peers. Patient taking administered medications.

## 2015-05-04 NOTE — Progress Notes (Signed)
Patient did attend the evening karaoke group. Pt was engaged and supportive but did not participate by singing a song.    

## 2015-05-04 NOTE — [facility] Group Notes (Signed)
[facility] Group Therapy 05/04/2015 1:15pm  Type of Therapy: Mental Health Association Presentation  Participation Level: Active  Participation Quality: Attentive  Affect: Appropriate  Cognitive: Oriented  Insight: Developing/Improving  Engagement in Therapy: Engaged  Modes of Intervention: Discussion, Education and Socialization  Summary of Progress/Problems: Mental Health Association (MHA) Speaker came to talk about his personal journey with substance abuse and addiction. The pt processed ways by which to relate to the speaker. MHA speaker provided handouts and educational information pertaining to groups and services offered by the [facility]. Pt was engaged in speaker's presentation and was receptive to resources provided.    Chad Cordial, LCSWA 05/04/2015 1:40 PM

## 2015-05-04 NOTE — [facility] Group Notes (Signed)
[facility] Group Notes:  (Nursing/MHT/Case Management/Adjunct)  Date:  05/04/2015  Time:  0900  Type of Therapy:  Nurse Education  Participation Level:  Active  Participation Quality:  Appropriate  Affect:  Appropriate and Depressed  Cognitive:  Alert, Appropriate and Oriented  Insight:  Appropriate  Engagement in Group:  Engaged  Modes of Intervention:  Discussion, Education and Support  Summary of Progress/Problems: Discussed setting SMART goals with pt. She shared a brief overview of why she's here and said she would be working on a list of 40 things she likes about herself.   Maurine Simmering 05/04/2015, 9:45 AM

## 2015-05-05 LAB — LIPID PANEL
Cholesterol: 194 mg/dL (ref 0–200)
HDL: 83 mg/dL (ref >40)
LDL Cholesterol: 94 mg/dL (ref 0–99)
Total CHOL/HDL Ratio: 2.3 RATIO
Triglycerides: 87 mg/dL (ref <150)
VLDL: 17 mg/dL (ref 0–40)

## 2015-05-05 NOTE — Progress Notes (Signed)
D Shalaina has had a quiet but good day toda.She attends her groups, takes her medications and was mediated with 25 Vistaril this afternoon, per her requests.    A sHE NEEDS TO CONT TO P.ROCESS.   r sAFETY IS IN PLACE.

## 2015-05-05 NOTE — Progress Notes (Signed)
D: Patient complains of anxiety tonight.  Patient states she is upset that he friends did not visit.  Patient states she is worried about her housing.  Patient states she is sad and still is having nightmares.  Patient was tearful during assessment but calm after a few minutes.  Patient denies SI/HI and denies AVH.   A: Staff to monitor Q 15 mins for safety.  Encouragement and support offered.  Scheduled medications administered per orders.   R: Patient remains safe on the unit.  Patient attended group tonight.  Patient visible on the unit and interacting with peers.  Patient taking administered medications.

## 2015-05-05 NOTE — [facility] Group Notes (Signed)
[facility] LCSW Group Therapy 05/05/2015 1:15pm  Type of Therapy: Group Therapy- Feelings Around Relapse and Recovery  Participation Level: Minimal  Participation Quality:  Attentive  Affect:  Flat  Cognitive: Alert and Oriented   Insight:  Developing   Engagement in Therapy: Developing/Improving   Modes of Intervention: Clarification, Confrontation, Discussion, Education, Exploration, Limit-setting, Orientation, Problem-solving, Rapport Building, Dance movement psychotherapist, Socialization and Support  Summary of Progress/Problems: The topic for today was feelings about relapse. The group discussed what relapse prevention is to them and identified triggers that they are on the path to relapse. Members also processed their feeling towards relapse and were able to relate to common experiences. Group also discussed coping skills that can be used for relapse prevention.  Pt did not participate verbally, however was observed to be engaged as she would respond with hand raises when the group was asked direct questions.    Therapeutic Modalities:   Cognitive Behavioral Therapy Solution-Focused Therapy Assertiveness Training Relapse Prevention Therapy    Joan [name] [phone] 05/05/2015 4:46 PM

## 2015-05-05 NOTE — Progress Notes (Signed)
[facility] MD Progress Note  05/05/2015 4:46 PM Joan [name]  MRN:  [id number] Subjective:  She reports feeling " about the same", and reports ongoing depression, sadness, anxiety, thoughts of cutting , not with suicidal intent, but to obtain relilef from mental discomfort. However, she denies any plan or intention of  Hurting herself, and states she is motivated in learning better coping skills to address negative affects. She is denying medication side effects. Objective: I have discussed case with treatment team and have met with patient. Patient has reported ongoing severe depression and anxiety. She reports ongoing PTSD symptoms, such as intrusive thoughts /memories of childhood victimization, and states she sometimes hears the voice of her father ( fleeting halls, does not appear internally Preoccupied or actively psychotic at this time). She is tolerating medications well. Denies side effects. She is visible in day room, active in milieu, not exhibiting any disruptive behaviors . Responsive to support , encouragement, affect tends to improve as session progresses . Labs - pregnancy test negative, lipid panel unremarkable, WNL.  Principal Problem: MDD (major depressive disorder), recurrent severe, without psychosis Diagnosis:   Patient Active Problem List   Diagnosis Date Noted  . MDD (major depressive disorder), recurrent severe, without psychosis [F33.2] 05/03/2015  . Major depressive disorder, recurrent, severe without psychotic features [F33.2]   . MDD (major depressive disorder), recurrent episode, severe [F33.2] 04/11/2015  . PTSD (post-traumatic stress disorder) [F43.10] 04/11/2015   Total Time spent with patient: 25 minutes     Past Medical History:  Past Medical History  Diagnosis Date  . Diabetes mellitus without complication     diet controlled  . Auditory hallucinations     Past Surgical History  Procedure Laterality Date  . Tonsillectomy     Family History:  History reviewed. No pertinent family history.  Social History:  History  Alcohol Use No     History  Drug Use No    Social History   Social History  . Marital Status: Married    Spouse Name: N/A  . Number of Children: N/A  . Years of Education: N/A   Social History Main Topics  . Smoking status: Never Smoker   . Smokeless tobacco: None  . Alcohol Use: No  . Drug Use: No  . Sexual Activity: Not Asked   Other Topics Concern  . None   Social History Narrative   Additional Social History:   Sleep: improved   Appetite:   Good   Current Medications: Current Facility-Administered Medications  Medication Dose Route Frequency Provider Last Rate Last Dose  . acetaminophen (TYLENOL) tablet 650 mg  650 mg Oral Q6H PRN Benjamine Mola, FNP      . alum & mag hydroxide-simeth (MAALOX/MYLANTA) 200-200-20 MG/5ML suspension 30 mL  30 mL Oral Q4H PRN Benjamine Mola, FNP      . ARIPiprazole (ABILIFY) tablet 10 mg  10 mg Oral Daily Jenne Campus, MD   10 mg at 05/05/15 0816  . citalopram (CELEXA) tablet 20 mg  20 mg Oral Daily Laverle Hobby, PA-C   20 mg at 05/05/15 0816  . hydrOXYzine (ATARAX/VISTARIL) tablet 25 mg  25 mg Oral Q6H PRN Laverle Hobby, PA-C   25 mg at 05/05/15 1313  . magnesium hydroxide (MILK OF MAGNESIA) suspension 30 mL  30 mL Oral Daily PRN Benjamine Mola, FNP      . prazosin (MINIPRESS) capsule 2 mg  2 mg Oral QHS Laverle Hobby, PA-C   2  mg at 05/04/15 2238  . traZODone (DESYREL) tablet 50 mg  50 mg Oral QHS PRN,MR X 1 Laverle Hobby, PA-C   50 mg at 05/04/15 2238    Lab Results:  Results for orders placed or performed during the hospital encounter of 05/03/15 (from the past 48 hour(s))  Pregnancy, urine     Status: None   Collection Time: 05/04/15  7:52 PM  Result Value Ref Range   Preg Test, Ur NEGATIVE NEGATIVE    Comment:        THE SENSITIVITY OF THIS METHODOLOGY IS >20 mIU/mL. Performed at [facility]   Lipid panel      Status: None   Collection Time: 05/05/15  6:43 AM  Result Value Ref Range   Cholesterol 194 0 - 200 mg/dL   Triglycerides 87 <150 mg/dL   HDL 83 >40 mg/dL   Total CHOL/HDL Ratio 2.3 RATIO   VLDL 17 0 - 40 mg/dL   LDL Cholesterol 94 0 - 99 mg/dL    Comment:        Total Cholesterol/HDL:CHD Risk Coronary Heart Disease Risk Table                     Men   Women  1/2 Average Risk   3.4   3.3  Average Risk       5.0   4.4  2 X Average Risk   9.6   7.1  3 X Average Risk  23.4   11.0        Use the calculated Patient Ratio above and the CHD Risk Table to determine the patient's CHD Risk.        ATP III CLASSIFICATION (LDL):  <100     mg/dL   Optimal  100-129  mg/dL   Near or Above                    Optimal  130-159  mg/dL   Borderline  160-189  mg/dL   High  >190     mg/dL   Very High Performed at [facility]     Physical Findings: AIMS: Facial and Oral Movements Muscles of Facial Expression: None, normal Lips and Perioral Area: None, normal Jaw: None, normal Tongue: None, normal,Extremity Movements Upper (arms, wrists, hands, fingers): None, normal Lower (legs, knees, ankles, toes): None, normal, Trunk Movements Neck, shoulders, hips: None, normal, Overall Severity Severity of abnormal movements (highest score from questions above): None, normal Incapacitation due to abnormal movements: None, normal Patient's awareness of abnormal movements (rate only patient's report): No Awareness, Dental Status Current problems with teeth and/or dentures?: No Does patient usually wear dentures?: No  CIWA:    COWS:     Musculoskeletal: Strength & Muscle Tone: within normal limits Gait & Station: normal Patient leans: N/A  Psychiatric Specialty Exam: ROS- denies  Headache, denies vomiting, denies chest pain or shortness of breath  Blood pressure 122/86, pulse 108, temperature 98.8 F (37.1 C), temperature source Oral, resp. rate 16, height _0  (1.676 m), weight 203 lb  (92.08 kg), last menstrual period 04/26/2015.Body mass index is 32.78 kg/(m^2).  General Appearance: improved grooming  Eye Contact::  Good  Speech:  Normal Rate  Volume:  Normal  Mood:  reports ongoing depression  Affect:  less constricted, more reactive, smiles at times appropriately  Thought Process:  Linear  Orientation:  Full (Time, Place, and Person)  Thought Content:  no delusions, reports occasional fleeting  auditory hallucinations, hearing her father's voice, but is not internally  preoccupied   Suicidal Thoughts:  No- at this time denies plan or intention of hurting self and contracts for safety on unit. Does have chronic self injurious ideations of cutting self as a way of decreasing anxiety /negative affects, but denies any intention of acting out  Homicidal Thoughts:  No  Memory:  recent and remote grossly intact   Judgement:  Other:  improving   Insight:  Fair  Psychomotor Activity:  Normal  Concentration:  Good  Recall:  Good  Fund of Knowledge:Good  Language: Good  Akathisia:  Negative  Handed:  Right  AIMS (if indicated):     Assets:  Communication Skills Desire for Improvement Resilience  ADL's:   improved  Cognition: WNL  Sleep:  Number of Hours: 6.5   Assessment - patient continues to report depression, sadness, and endorses ongoing thoughts of self cutting ,as a way of coping with negative affects. Behavior on unit in good control , no self injurious behaviors, and denies plan or intention of hurting self. Affect actually presents improved compared to her admission presentation. Reports chronic PTSD symptoms. At this time tolerating medications well.  Treatment Plan Summary: Daily contact with patient to assess and evaluate symptoms and progress in treatment, Medication management, Plan inpatient treatment  and medications as below Continue Abilify 10 mgrs QDAY to address depression, mood disorder, psychotic symptoms Continue Celexa 20 mgrs QDAY to address  depression, anxiety, PTSD  Continue Minipress 2 mgrsd QHS to address PTSD related nightmares  Continue Vistaril PRNS for severe anxiety as needed  Continue Trazodone 50 mgrs QHS PRN for insomnia as needed  Encourage ongoing milieu, group participation to address coping skills and symptom reduction Treatment team /CSW working on disposition options Nereida Schepp, [place] 05/05/2015, 4:46 PM

## 2015-05-05 NOTE — B[facility] Group Notes (Signed)
[facility] LCSW Aftercare Discharge Planning Group Note  05/05/2015 8:45 AM  Participation Quality: Alert, Appropriate and Oriented  Mood/Affect: Flat  Depression Rating: 10  Anxiety Rating: 10  Thoughts of Suicide: Pt endorses SI  Will you contract for safety? Yes  Current AVH: Pt denies  Plan for Discharge/Comments: Pt attended discharge planning group and actively participated in group. CSW discussed suicide prevention education with the group and encouraged them to discuss discharge planning and any relevant barriers. Pt reports that she is feeling more depressed and anxious this morning due to having a nightmare and a bad conversation this morning. Pt demonstrates developing insight AEB her ability to recognize that she takes things "too personally" at times.  Transportation Means: Pt reports access to transportation  Supports: Roommates   Chad Cordial, LCSWA 05/05/2015 9:25 AM

## 2015-05-05 NOTE — Progress Notes (Signed)
Adult Psychoeducational Group Note  Date:  05/05/2015 Time:  9:31 PM  Group Topic/Focus:  Wrap-Up Group:   The focus of this group is to help patients review their daily goal of treatment and discuss progress on daily workbooks.  Participation Level:  Active  Participation Quality:  Appropriate and Attentive  Affect:  Appropriate  Cognitive:  Appropriate  Insight: Appropriate and Good  Engagement in Group:  Engaged  Modes of Intervention:  Education  Additional Comments:  Pt relapse prevention goal is to try and stop hurting self. Pt  Also tends to seek a support group upon discharge.   Merlinda Frederick 05/05/2015, 9:31 PM

## 2015-05-06 LAB — PROLACTIN: Prolactin: 14.5 ng/mL (ref 4.8–23.3)

## 2015-05-06 NOTE — [facility] Group Notes (Signed)
[facility] Group Notes:  (Nursing/MHT/Case Management/Adjunct)  Date:  05/06/2015  Time:  9:28 AM  Type of Therapy:  Psychoeducational Skills  Participation Level:  Active  Participation Quality:  Appropriate  Affect:  Appropriate  Cognitive:  Appropriate  Insight:  Appropriate  Engagement in Group:  Engaged  Modes of Intervention:  Discussion  Summary of Progress/Problems: Pt did attend self inventory group.   [name] 05/06/2015, 9:28 AM 

## 2015-05-06 NOTE — Progress Notes (Signed)
Patient ID: Joan [name], female   DOB: Jan 30, 1981, 34 y.o.   MRN: [id number] [facility] MD Progress Note  05/06/2015 4:40 PM Joan [name]  MRN:  [id number] Subjective:  She reports feeling somewhat better in general, but states that yesterday felt more depressed because she has been trying to call the friends she lives with and they have not answered the calls, making her think they do not care about her any more and will not allow her to return to live with them after discharge. However, by today she is feeling somewhat more hopeful and states " even if that is true I have some other friends I could possibly stay with". Denies medication side effects.  Objective: I have reviewed chart notes and have met with patient. Remains depressed and at times is noted to exhibit a somewhat regressed , childlike affect, behavior. She states that when she faces emotional stressors such as above ( feeling her friends no longer care about her) she has urges to cut on herself , not to kill self but to change " the mental pain into physical - it makes it better ". She has found that if she rubs on wrist she gets relief without compromising skin integrity. She states she has occasional auditory hallucinations of her father calling her name, at times leading to increased anxiety. Does not appear internally preoccupied or psychotic at this time. No medications side effects. She is visible in day room, active in milieu,  Going to groups , interacting with selected peers . No agitated or disruptive behaviors . She was very reassured and her affect improved significantly when we reviewed her lipid panel results , states that in the past she had severe hypertriglyceridemia,  And that she is very happy to see this level normalized at present .  Principal Problem: MDD (major depressive disorder), recurrent severe, without psychosis ([place]) Diagnosis:   Patient Active Problem List   Diagnosis Date Noted  . MDD (major  depressive disorder), recurrent severe, without psychosis [F33.2] 05/03/2015  . Major depressive disorder, recurrent, severe without psychotic features [F33.2]   . MDD (major depressive disorder), recurrent episode, severe [F33.2] 04/11/2015  . PTSD (post-traumatic stress disorder) [F43.10] 04/11/2015   Total Time spent with patient: 20 minutes    Past Medical History:  Past Medical History  Diagnosis Date  . Diabetes mellitus without complication     diet controlled  . Auditory hallucinations     Past Surgical History  Procedure Laterality Date  . Tonsillectomy     Family History: History reviewed. No pertinent family history.  Social History:  History  Alcohol Use No     History  Drug Use No    Social History   Social History  . Marital Status: Married    Spouse Name: N/A  . Number of Children: N/A  . Years of Education: N/A   Social History Main Topics  . Smoking status: Never Smoker   . Smokeless tobacco: None  . Alcohol Use: No  . Drug Use: No  . Sexual Activity: Not Asked   Other Topics Concern  . None   Social History Narrative   Additional Social History:   Sleep: improved   Appetite:   Good   Current Medications: Current Facility-Administered Medications  Medication Dose Route Frequency Provider Last Rate Last Dose  . acetaminophen (TYLENOL) tablet 650 mg  650 mg Oral Q6H PRN Benjamine Mola, FNP      . alum & mag hydroxide-simeth (MAALOX/MYLANTA)  200-200-20 MG/5ML suspension 30 mL  30 mL Oral Q4H PRN Benjamine Mola, FNP      . ARIPiprazole (ABILIFY) tablet 10 mg  10 mg Oral Daily Jenne Campus, MD   10 mg at 05/06/15 0809  . citalopram (CELEXA) tablet 20 mg  20 mg Oral Daily Laverle Hobby, PA-C   20 mg at 05/06/15 0809  . hydrOXYzine (ATARAX/VISTARIL) tablet 25 mg  25 mg Oral Q6H PRN Laverle Hobby, PA-C   25 mg at 05/05/15 1313  . magnesium hydroxide (MILK OF MAGNESIA) suspension 30 mL  30 mL Oral Daily PRN Benjamine Mola, FNP      .  prazosin (MINIPRESS) capsule 2 mg  2 mg Oral QHS Laverle Hobby, PA-C   2 mg at 05/05/15 2126  . traZODone (DESYREL) tablet 50 mg  50 mg Oral QHS PRN,MR X 1 Laverle Hobby, PA-C   50 mg at 05/05/15 2126    Lab Results:  Results for orders placed or performed during the hospital encounter of 05/03/15 (from the past 48 hour(s))  Pregnancy, urine     Status: None   Collection Time: 05/04/15  7:52 PM  Result Value Ref Range   Preg Test, Ur NEGATIVE NEGATIVE    Comment:        THE SENSITIVITY OF THIS METHODOLOGY IS >20 mIU/mL. Performed at [facility]   Lipid panel     Status: None   Collection Time: 05/05/15  6:43 AM  Result Value Ref Range   Cholesterol 194 0 - 200 mg/dL   Triglycerides 87 <150 mg/dL   HDL 83 >40 mg/dL   Total CHOL/HDL Ratio 2.3 RATIO   VLDL 17 0 - 40 mg/dL   LDL Cholesterol 94 0 - 99 mg/dL    Comment:        Total Cholesterol/HDL:CHD Risk Coronary Heart Disease Risk Table                     Men   Women  1/2 Average Risk   3.4   3.3  Average Risk       5.0   4.4  2 X Average Risk   9.6   7.1  3 X Average Risk  23.4   11.0        Use the calculated Patient Ratio above and the CHD Risk Table to determine the patient's CHD Risk.        ATP III CLASSIFICATION (LDL):  <100     mg/dL   Optimal  100-129  mg/dL   Near or Above                    Optimal  130-159  mg/dL   Borderline  160-189  mg/dL   High  >190     mg/dL   Very High Performed at [facility]   Prolactin     Status: None   Collection Time: 05/05/15  6:43 AM  Result Value Ref Range   Prolactin 14.5 4.8 - 23.3 ng/mL    Comment: (NOTE) Performed At: [facility] [place], [place] [id number] Lindon Romp MD [id number] Performed at [facility]     Physical Findings: AIMS: Facial and Oral Movements Muscles of Facial Expression: None, normal Lips and Perioral Area: None, normal Jaw: None, normal Tongue: None,  normal,Extremity Movements Upper (arms, wrists, hands, fingers): None, normal Lower (legs, knees, ankles, toes): None, normal,  Trunk Movements Neck, shoulders, hips: None, normal, Overall Severity Severity of abnormal movements (highest score from questions above): None, normal Incapacitation due to abnormal movements: None, normal Patient's awareness of abnormal movements (rate only patient's report): No Awareness, Dental Status Current problems with teeth and/or dentures?: No Does patient usually wear dentures?: No  CIWA:    COWS:     Musculoskeletal: Strength & Muscle Tone: within normal limits Gait & Station: normal Patient leans: N/A  Psychiatric Specialty Exam: ROS- denies  Headache, denies vomiting, denies chest pain or shortness of breath  Blood pressure 118/72, pulse 104, temperature 97.9 F (36.6 C), temperature source Oral, resp. rate 16, height '5\' 6"'  (1.676 m), weight 203 lb (92.08 kg), last menstrual period 04/26/2015.Body mass index is 32.78 kg/(m^2).  General Appearance: improved grooming  Eye Contact::  Good  Speech:  Normal Rate  Volume:  Normal  Mood:  reports ongoing depression, but acknowledges some improvement   Affect:   Reactive, smiles appropriately at times.   Thought Process:  Linear  Orientation:  Full (Time, Place, and Person)  Thought Content:  no delusions, reports occasional fleeting auditory hallucinations, hearing her father's voice, but is not internally  preoccupied   Suicidal Thoughts:  No- at this time denies plan or intention of hurting self and contracts for safety on unit.   Homicidal Thoughts:  No  Memory:  recent and remote grossly intact   Judgement:  Other:  improving   Insight:  improving  Psychomotor Activity:  Normal  Concentration:  Good  Recall:  Good  Fund of Knowledge:Good  Language: Good  Akathisia:  Negative  Handed:  Right  AIMS (if indicated):     Assets:  Communication Skills Desire for Improvement Resilience   ADL's:   improved  Cognition: WNL  Sleep:  Number of Hours: 6.75   Assessment - patient reports she still feels depressed, anxious, particularly due to concern her friends she lives with have not answered her calls and may not be willing for her to return to live with them after  Discharge . She is , however, acknowledging some improvement and her affect is clearly improved and more reactive compared to admission.  Reports improved but still not completely resolved fleeting auditory hallucinations of her father's voice calling her . Tolerating medications well. Able to contract for safety on unit at this time. Treatment Plan Summary: Daily contact with patient to assess and evaluate symptoms and progress in treatment, Medication management, Plan inpatient treatment  and medications as below Continue Abilify 10 mgrs QDAY to address depression, mood disorder, psychotic symptoms Continue Celexa 20 mgrs QDAY to address depression, anxiety, PTSD  Continue Minipress 2 mgrsd QHS to address PTSD related nightmares  Continue Vistaril PRNS for severe anxiety as needed  Continue Trazodone 50 mgrs QHS PRN for insomnia as needed  Encourage ongoing milieu, group participation to address coping skills and symptom reduction Treatment team /CSW working on disposition options Joan [name], [place] 05/06/2015, 4:40 PM

## 2015-05-06 NOTE — Progress Notes (Signed)
D: Pt has depressed affect and mood.  She was in her room upon initial approach.  Pt was very tearful when Probation officer initially met with pt.  Pt reported "I was fine 'til my friends came and I've been emotional since."  Pt states "they don't want me at home until I'm feeling better."  Pt reports she is worried about placement for aftercare because she does not have insurance.  She reports thoughts of self-harm without a plan.  Pt verbally contracts for safety. Pt denies HI, denies hallucinations, denies pain.  Pt has been visible in milieu interacting with peers and staff appropriately.  Pt attended evening group.   A: Introduced self to pt.  Met with pt 1:1 and provided support and encouragement.  Actively listened to pt.  Medications administered per order.  PRN medication administered for sleep.  Pt was encouraged to discuss aftercare options with her social worker and pt verbalized understanding.   R: Pt is compliant with medications.  Pt verbally contracts for safety.  Will continue to monitor and assess.

## 2015-05-06 NOTE — B[facility] Group Notes (Signed)
[facility] Group Notes:  (Clinical Social Work)  05/06/2015     1:15-2:15PM  Summary of Progress/Problems:   The main focus of today's process group was to learn how to use a decisional balance exercise to move forward in the Stages of Change, which were described and discussed.  Patients listed needs on the whiteboard and unhealthy coping techniques often used to fill needs.  Motivational Interviewing and the whiteboard were utilized to help patients explore in depth the perceived benefits and costs of unhealthy coping techniques, as well as the  benefits and costs of replacing that with a healthy coping skills.  A handout was distributed for patients to be able to do this exercise for themselves.   The patient expressed that their own unhealthy coping involves cutting/self-harm.  She was very active during the entire group, and talked about how her self-harm leads to suicidal ideation to the extent she can see no other solution.  Type of Therapy:  Group Therapy - Process   Participation Level:  Active  Participation Quality:  Appropriate, Attentive, Sharing and Supportive  Affect:  Blunted and Tearful  Cognitive:  Alert and Appropriate  Insight:  Developing/Improving  Engagement in Therapy:  Engaged  Modes of Intervention:  Education, Motivational Interviewing  Ambrose Mantle, LCSW 05/06/2015, 5:06 PM

## 2015-05-06 NOTE — BHH Group Notes (Signed)
[facility] Group Notes:  (Nursing/MHT/Case Management/Adjunct)  Date:  05/06/2015  Time:  11:27 AM  Type of Therapy:  Psychoeducational Skills  Participation Level:  Active  Participation Quality:  Appropriate  Affect:  Appropriate  Cognitive:  Appropriate  Insight:  Appropriate  Engagement in Group:  Engaged  Modes of Intervention:  Discussion  Summary of Progress/Problems: Pt did attend healthy coping skills group.    Valaree Fresquez Shanta 05/06/2015, 11:27 AM 

## 2015-05-06 NOTE — Plan of Care (Signed)
Problem: Diagnosis: Increased Risk For Suicide Attempt Goal: STG-Patient Will Attend All Groups On The Unit Outcome: Progressing Pt attended evening group on 05/06/15.     

## 2015-05-06 NOTE — Progress Notes (Signed)
Joan [name] is learning new coping skills as evid by attending her groups, learning to process her emotions ( and how they impact her unhealthy behaviors) and learning to forgive herslef. She attended her groups today, she shared personal experiences with this Clinical research associate and she verbalized feeling like she wanted / needed to cut ( last night ) and  Asked for help instead !!!   A She cont to have passive SI but she contracts easily with this nurse for safety.    R Safety in place.

## 2015-05-06 NOTE — Progress Notes (Signed)
The focus of this group is to help patients review their daily goal of treatment and discuss progress on daily workbooks. Pt attended the evening group session and responded to all discussion prompts from the Writer. Pt shared that she had a good day on the unit, the highlight of which was learning that she had reduced her cholesterol through diet and exercise. Joan [name] also mentioned having made friendships with several other patients on the unit. Her only additional requests from Nursing Staff this evening were for towels, which were given to her following group. Pt's affect was appropriate.

## 2015-05-07 LAB — URINALYSIS, ROUTINE W REFLEX MICROSCOPIC
Bilirubin Urine: NEGATIVE
Glucose, UA: NEGATIVE mg/dL
Hgb urine dipstick: NEGATIVE
Ketones, ur: NEGATIVE mg/dL
Leukocytes, UA: NEGATIVE
Nitrite: NEGATIVE
Protein, ur: NEGATIVE mg/dL
Specific Gravity, Urine: 1.01 (ref 1.005–1.030)
Urobilinogen, UA: 0.2 mg/dL (ref 0.0–1.0)
pH: 6.5 (ref 5.0–8.0)

## 2015-05-07 MED ORDER — PRAZOSIN HCL 2 MG PO CAPS
3.0000 mg | ORAL_CAPSULE | Freq: Every day | ORAL | Status: DC
  Administered 2015-05-07 – 2015-05-09 (×3): 3 mg via ORAL
  Filled 2015-05-07 (×5): qty 1

## 2015-05-07 MED ORDER — PRAZOSIN HCL 1 MG PO CAPS
ORAL_CAPSULE | ORAL | Status: AC
  Filled 2015-05-07: qty 1

## 2015-05-07 MED ORDER — ARIPIPRAZOLE 15 MG PO TABS
15.0000 mg | ORAL_TABLET | Freq: Every day | ORAL | Status: DC
  Administered 2015-05-08: 15 mg via ORAL
  Filled 2015-05-07 (×4): qty 1

## 2015-05-07 MED ORDER — TRAZODONE HCL 100 MG PO TABS
100.0000 mg | ORAL_TABLET | Freq: Every evening | ORAL | Status: DC | PRN
  Administered 2015-05-07: 100 mg via ORAL
  Filled 2015-05-07: qty 1

## 2015-05-07 NOTE — Progress Notes (Signed)
Patient ID: Joan [name], female   DOB: [birth year], 34 y.o.   MRN: [id number]  DAR: Pt. Denies HI and A/V Hallucinations. She reports SI that is passive and she can contract for safety. Patient came to writer later and reported that she rubbed her pencil on her skin. MD was notified of this. Patient does not report any pain or discomfort at this time. Support and encouragement provided to the patient. Scheduled medications administered to patient per physician's orders. Patient is minimal but is seen in the milieu interacting with peers and is attending groups. Q15 minute checks are maintained for safety.

## 2015-05-07 NOTE — [facility] Group Notes (Signed)
[facility] Group Notes:  (Nursing/MHT/Case Management/Adjunct)  Date:  05/07/2015  Time:  10:54 AM  Type of Therapy:  Nurse Education  Participation Level:  Active  Participation Quality:  Appropriate  Affect:  Appropriate  Cognitive:  Appropriate  Insight:  Lacking  Engagement in Group:  Engaged  Modes of Intervention:  Discussion and Education  Summary of Progress/Problems: Patient attended group and reports that she is not having a good day. She reports she is feeling alone. Patient was encouraged to focus on a goal for the day and review booklet of the day which is healthy support systems.   Marzetta Board E 05/07/2015, 10:54 AM

## 2015-05-07 NOTE — Plan of Care (Signed)
Problem: Diagnosis: Increased Risk For Suicide Attempt Goal: STG-Patient Will Comply With Medication Regime Outcome: Progressing Pt has been compliant with medications tonight.       

## 2015-05-07 NOTE — [facility] Group Notes (Signed)
[facility] Group Notes:  (Clinical Social Work)  05/07/2015  1:15-2:15PM  Summary of Progress/Problems:   The main focus of today's process group was to   1)  discuss the importance of adding supports  2)  define health supports versus unhealthy supports  3)  identify the patient's current unhealthy supports and plan how to handle them  4)  Identify the patient's current healthy supports and plan what to add.  An emphasis was placed on using counselor, doctor, therapy groups, 12-step groups, and problem-specific support groups to expand supports.    The patient expressed full comprehension of the concepts presented, and agreed that there is a need to add more supports.  The patient stated she has been informed by her friends she lives with that she cannot return there to live until she is stable and not desirous of cutting herself any longer.  She talked about using positive affirmations around her current room as well as at home.  She is working on a schedule for every day for a week which she would the repeat weekly.  She was encouraging to others in the group who have been supportive of her in the last 24 hours and how much it has meant to her.  Type of Therapy:  Process Group with Motivational Interviewing  Participation Level:  Active  Participation Quality:  Appropriate, Attentive, Sharing and Supportive  Affect:  Blunted  Cognitive:  Alert, Appropriate and Oriented  Insight:  Engaged  Engagement in Therapy:  Engaged  Modes of Intervention:   Education, Support and Processing, Activity  Ambrose Mantle, LCSW 05/07/2015

## 2015-05-07 NOTE — Progress Notes (Signed)
D: Pt has depressed affect and mood.  When asked how her day was, pt states "this morning it was a little emotional but it got better."  Pt reports her goal today was "to love myself more."  Pt reports "I wrote down some things I love about myself and hung it up in my room."  Pt denies SI at this time, but reports she had thoughts of self-harm "earlier today."  Pt verbally contracts for safety.  Pt denies HI, denies hallucinations at this time, denies pain.  Pt has been visible in milieu interacting with peers and staff appropriately.  Pt attended evening group.   A:  Met with pt 1:1 and provided support and encouragement.  Positive coping skills encouraged and reinforced.  Actively listened to pt.  Medications administered per order.  PRN medication administered for sleep. R: Pt is compliant with medications.  Pt verbally contracts for safety.  Will continue to monitor and assess.

## 2015-05-07 NOTE — Progress Notes (Signed)
Adult Psychoeducational Group Note  Date:  05/07/2015 Time:  9:01 PM  Group Topic/Focus:  Wrap-Up Group:   The focus of this group is to help patients review their daily goal of treatment and discuss progress on daily workbooks.  Participation Level:  Active  Participation Quality:  Appropriate and Attentive  Affect:  Appropriate  Cognitive:  Appropriate  Insight: Appropriate and Good  Engagement in Group:  Engaged  Modes of Intervention:  Education  Additional Comments:   Pt's was asked to provide whom they consider a healthy support system based on their group topic this morning. Pt consider her friend ale and her peers here to be a healthy support sytem for her.    Merlinda Frederick 05/07/2015, 9:01 PM
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
## 2015-05-07 NOTE — Plan of Care (Signed)
Problem: Alteration in mood Goal: STG-Patient reports thoughts of self-harm to staff Outcome: Progressing Patient continues to report thoughts of self harm and is able to contract for safety.

## 2015-05-08 MED ORDER — ARIPIPRAZOLE 15 MG PO TABS
15.0000 mg | ORAL_TABLET | Freq: Every day | ORAL | Status: DC
  Administered 2015-05-09: 15 mg via ORAL
  Filled 2015-05-08 (×3): qty 1

## 2015-05-08 MED ORDER — OLANZAPINE 5 MG PO TABS
5.0000 mg | ORAL_TABLET | Freq: Once | ORAL | Status: AC
  Administered 2015-05-08: 5 mg via ORAL
  Filled 2015-05-08 (×2): qty 1

## 2015-05-08 NOTE — [facility] Group Notes (Signed)
[facility] LCSW Group Therapy  05/08/2015 1:15pm  Type of Therapy:  Group Therapy vercoming Obstacles  Participation Level:  Active  Participation Quality:  Appropriate   Affect:  Appropriate  Cognitive:  Appropriate and Oriented  Insight:  Developing/Improving and Improving  Engagement in Therapy:  Improving  Modes of Intervention:  Discussion, Exploration, Problem-solving and Support  Description of Group:   In this group patients will be encouraged to explore what they see as obstacles to their own wellness and recovery. They will be guided to discuss their thoughts, feelings, and behaviors related to these obstacles. The group will process together ways to cope with barriers, with attention given to specific choices patients can make. Each patient will be challenged to identify changes they are motivated to make in order to overcome their obstacles. This group will be process-oriented, with patients participating in exploration of their own experiences as well as giving and receiving support and challenge from other group members.  Summary of Patient Progress: Pt reported that a major obstacle for her at this time is homelessness and a strained relationship with her former roommates. Pt expresses that she feels abandoned and reports that she often times has difficulty communicating her needs. Pt was observed to be tearful at appropriate terms when discussing that she feels stressed about not having a place to go when discharged.   Therapeutic Modalities:   Cognitive Behavioral Therapy Solution Focused Therapy Motivational Interviewing Relapse Prevention Therapy   Chad Cordial, LCSWA 05/08/2015 3:15 PM

## 2015-05-08 NOTE — Progress Notes (Signed)
Patient ID: SHANDREKA DANTE, female   DOB: 05-Sep-1980, 34 y.o.   MRN: [id number] Patient ID: NAKEYSHA PASQUAL, female   DOB: [birth year], 34 y.o.   MRN: [id number] [facility] MD Progress Note  05/08/2015 2:13 PM ELIZETH WEINRICH  MRN:  [id number]  Subjective: Janiyla says "I feel pretty good today, just not sleeping well"  Objective: [place] is seen, chart reviewed. She spent most of her time in bed today. She says she doing good today. Says she came in to the hospital because she attempted to cut herself to hurt herself. She says her mental pain surpasses her physical pains, so she cuts to actually feel better mentally when she feels the physical pain. Today, she presents with good affects & eye contact. With her knowledge, will switch Abilify to bedtime starting tomorrow nite. Will give Zyprexa 5 mg once tonight to aid with sleep.  Principal Problem: MDD (major depressive disorder), recurrent severe, without psychosis ([facility])  Diagnosis:   Patient Active Problem List   Diagnosis Date Noted  . MDD (major depressive disorder), recurrent severe, without psychosis ([facility]) [F33.2] 05/03/2015  . Major depressive disorder, recurrent, severe without psychotic features ([facility]) [F33.2]   . MDD (major depressive disorder), recurrent episode, severe ([facility]) [F33.2] 04/11/2015  . PTSD (post-traumatic stress disorder) [F43.10] 04/11/2015   Total Time spent with patient: 20 minutes  Past Medical History:  Past Medical History  Diagnosis Date  . Diabetes mellitus without complication     diet controlled  . Auditory hallucinations     Past Surgical History  Procedure Laterality Date  . Tonsillectomy     Family History: History reviewed. No pertinent family history.  Social History:  History  Alcohol Use No     History  Drug Use No    Social History   Social History  . Marital Status: Married    Spouse Name: N/A  . Number of Children: N/A  . Years of Education: N/A   Social History Main Topics  . Smoking  status: Never Smoker   . Smokeless tobacco: None  . Alcohol Use: No  . Drug Use: No  . Sexual Activity: Not Asked   Other Topics Concern  . None   Social History Narrative   Additional Social History:   Sleep: Good.  Appetite: Good   Current Medications: Current Facility-Administered Medications  Medication Dose Route Frequency Provider Last Rate Last Dose  . acetaminophen (TYLENOL) tablet 650 mg  650 mg Oral Q6H PRN Beau Fanny, FNP   650 mg at 05/08/15 0865  . alum & mag hydroxide-simeth (MAALOX/MYLANTA) 200-200-20 MG/5ML suspension 30 mL  30 mL Oral Q4H PRN Beau Fanny, FNP      . ARIPiprazole (ABILIFY) tablet 15 mg  15 mg Oral Daily Craige Cotta, MD   15 mg at 05/08/15 0749  . citalopram (CELEXA) tablet 20 mg  20 mg Oral Daily Kerry Hough, PA-C   20 mg at 05/08/15 0749  . hydrOXYzine (ATARAX/VISTARIL) tablet 25 mg  25 mg Oral Q6H PRN Kerry Hough, PA-C   25 mg at 05/08/15 0646  . magnesium hydroxide (MILK OF MAGNESIA) suspension 30 mL  30 mL Oral Daily PRN Beau Fanny, FNP      . prazosin (MINIPRESS) capsule 3 mg  3 mg Oral QHS Craige Cotta, MD   3 mg at 05/07/15 2111  . traZODone (DESYREL) tablet 100 mg  100 mg Oral QHS PRN,MR X 1 Craige Cotta, MD  100 mg at 05/07/15 2111    Lab Results:  Results for orders placed or performed during the hospital encounter of 05/03/15 (from the past 48 hour(s))  Urinalysis, Routine w reflex microscopic (not at [facility])     Status: None   Collection Time: 05/07/15  3:00 PM  Result Value Ref Range   Color, Urine YELLOW YELLOW   APPearance CLEAR CLEAR   Specific Gravity, Urine 1.010 1.005 - 1.030   pH 6.5 5.0 - 8.0   Glucose, UA NEGATIVE NEGATIVE mg/dL   Hgb urine dipstick NEGATIVE NEGATIVE   Bilirubin Urine NEGATIVE NEGATIVE   Ketones, ur NEGATIVE NEGATIVE mg/dL   Protein, ur NEGATIVE NEGATIVE mg/dL   Urobilinogen, UA 0.2 0.0 - 1.0 mg/dL   Nitrite NEGATIVE NEGATIVE   Leukocytes, UA NEGATIVE NEGATIVE     Comment: MICROSCOPIC NOT DONE ON URINES WITH NEGATIVE PROTEIN, BLOOD, LEUKOCYTES, NITRITE, OR GLUCOSE <1000 mg/dL. Performed at [facility]    Physical Findings:  AIMS: Facial and Oral Movements Muscles of Facial Expression: None, normal Lips and Perioral Area: None, normal Jaw: None, normal Tongue: None, normal,Extremity Movements Upper (arms, wrists, hands, fingers): None, normal Lower (legs, knees, ankles, toes): None, normal, Trunk Movements Neck, shoulders, hips: None, normal, Overall Severity Severity of abnormal movements (highest score from questions above): None, normal Incapacitation due to abnormal movements: None, normal Patient's awareness of abnormal movements (rate only patient's report): No Awareness, Dental Status Current problems with teeth and/or dentures?: No Does patient usually wear dentures?: No  CIWA:    COWS:     Musculoskeletal: Strength & Muscle Tone: within normal limits Gait & Station: normal Patient leans: N/A  Psychiatric Specialty Exam: ROS- denies  Headache, denies vomiting, denies chest pain or shortness of breath  Blood pressure 101/51, pulse 93, temperature 97.6 F (36.4 C), temperature source Oral, resp. rate 17, height  (1.676 m), weight 92.08 kg (203 lb), last menstrual period 04/26/2015.Body mass index is 32.78 kg/(m^2).  General Appearance: improved grooming  Eye Contact::  Good  Speech:  Normal Rate  Volume:  Normal  Mood:  Depressed, but improved compared to admission  Affect:    Reactive, appropriate  Thought Process:  Linear  Orientation:  Full (Time, Place, and Person)  Thought Content:  no delusions, reports occasional fleeting auditory hallucinations, hearing her father's voice, but is not internally  preoccupied   Suicidal Thoughts:  No- at this time denies plan or intention of hurting self and contracts for safety on unit.   Homicidal Thoughts:  No  Memory:  recent and remote grossly intact    Judgement:  Other:  improving   Insight:  improving  Psychomotor Activity:  Normal  Concentration:  Good  Recall:  Good  Fund of Knowledge:Good  Language: Good  Akathisia:  Negative  Handed:  Right  AIMS (if indicated):     Assets:  Communication Skills Desire for Improvement Resilience  ADL's:   improved  Cognition: WNL  Sleep:  Number of Hours: 5.25   Assessment - some ongoing depression, but generally improved compared to admission. Future oriented and wanting to discuss follow up options with CSW, to include going to [facility] after D/C. She has occasional auditory halls of her father calling her but does not present psychotic, and had a nightmare last night, resulting in poor sleep . She has been better able to avoid self harm, and  Is denying any plan or intention of self cutting at this time. Tolerating medications  well. Able to contract for safety on unit at this time.  Treatment Plan Summary: Daily contact with patient to assess and evaluate symptoms and progress in treatment, Medication management, Plan inpatient treatment  and medications as below  Will switch Abilify 15  mgrs to q hs to address depression, mood disorder, and persistent  psychotic symptoms/insomnia. Will begin tomorrow night. Adm Zyprexa 5 mg once tonight for mood control/insomnia.  Continue Celexa 20 mgrs daily to address depression, anxiety, PTSD   Continue  Minipress to 3  mgrs QHS to address PTSD related nightmares - we reviewed side effects, risk of orthostasis, dizziness as side effect.  Continue Vistaril PRNS for severe anxiety as needed   Continue Trazodone  To 100  mgrs QHS PRN for insomnia as needed   Encourage ongoing milieu, group participation to address coping skills and symptom reduction  Treatment team /CSW working on disposition options. Reviewed most recent labs.  Sanjuana Kava, PMHNP, FNP-Bc 05/08/2015, 2:13 PM I agree with assessment and plan Madie Reno A. Dub Mikes, M.D.

## 2015-05-08 NOTE — Plan of Care (Signed)
Problem: Ineffective individual coping Goal: STG-Increase in ability to manage activities of daily living Outcome: Progressing Patient is able to manage ADL's appropriately     

## 2015-05-08 NOTE — Progress Notes (Signed)
Adult Psychoeducational Group Note  Date:  05/08/2015 Time:  8:45 PM  Group Topic/Focus:  Wrap-Up Group:   The focus of this group is to help patients review their daily goal of treatment and discuss progress on daily workbooks.  Participation Level:  Active  Participation Quality:  Appropriate  Affect:  Appropriate  Cognitive:  Appropriate  Insight: Good  Engagement in Group:  Engaged  Modes of Intervention:  Discussion  Additional Comments:  PT rated overall day a 5 out of 10 because she felt sleepy all day. Pt noted that her finding a place to go after discharge was the highlight of her day. Pt also reported that her goal for the day was to not hurt herself or have thoughts of hurting herself, which she feels that she achieved.   Joan [name] 05/08/2015, 9:28 PM

## 2015-05-08 NOTE — [facility] Group Notes (Signed)
[facility] LCSW Aftercare Discharge Planning Group Note  05/08/2015 8:45 AM  Participation Quality: Alert, Appropriate and Oriented  Mood/Affect: Flat  Depression Rating: 6/7  Anxiety Rating: 8/9  Thoughts of Suicide: Pt denies SI/HI  Will you contract for safety? Yes  Current AVH: Pt denies  Plan for Discharge/Comments: Pt attended discharge planning group and actively participated in group. CSW discussed suicide prevention education with the group and encouraged them to discuss discharge planning and any relevant barriers. Pt presents with flat affect, withdrawn demeanor. Reports increased stress because roommates stated that she cannot come back to their house.   Transportation Means: Pt reports access to transportation  Supports: No supports mentioned at this time  Chad Cordial, LCSWA 05/08/2015 9:59 AM

## 2015-05-08 NOTE — Progress Notes (Signed)
Patient ID: Joan [name], female   DOB: 08/20/1980, 34 y.o.   MRN: [id number]  DAR: Pt. Denies SI/HI and A/V Hallucinations. Patient reports her sleep was poor, appetite is good, energy level is normal, and concentration is good. Patient rates her depression 9/10, hopelessness 10/10, and anxiety 8/10. Patient reports she is not having a good day today. Affect is anxious and mood is depressed. She reports that her roommates do not want her to return to her same living arrangements after discharge. She reported a headache earlier and received PRN medication which provided relief. Support and encouragement provided to the patient. Scheduled medications administered to patient per physician's orders. Patient is receptive and cooperative. She is seen in the milieu at times interacting with peers and is attending groups. Q15 minute checks are maintained for safety.

## 2015-05-08 NOTE — Progress Notes (Signed)
D: Patient in at the medication window on first approach. Patient states she has been drowsy all day.  Patient states she is worried about where she will go when discharged.  Patient states there are two places she is hoping she will qualify for. Patient states her goal for today was not to have thoughts of hurting herself.  Patient states, "I am having a hard time being homeless." Patient denies SI/HI and denies AVH.    A: Staff to monitor Q 15 mins for safety.  Encouragement and support offered.  Scheduled medications administered per orders. R: Patient remains safe on the unit.  Patient attended group tonight.  Patient visible on the unit.  Patient taking administered medications.

## 2015-05-09 MED ORDER — TRAZODONE HCL 150 MG PO TABS: 75.0000 mg | ORAL_TABLET | Freq: Every evening | ORAL | Status: DC | PRN

## 2015-05-09 NOTE — Progress Notes (Signed)
Patient ID: Joan [name], female   DOB: [birth year], 34 y.o.   MRN: [id number]  Pt presents with an animated affect and impulsive behavior. Pt speech is rapid this morning. Pt exhibiting intrusive behaviors like talking over pts receiving their medications at the window and walking into the nurses station even though being advised to stay behind the line. Pt is redirectable and cooperative. Per self inventory, pt rates depression at a 8, hopelessness 5 and anxiety 6. Pt's daily goal is to "to stay positive" and they intend to do so by "write positive words." Pt reports good sleep, a good appetite, normal energy and good concentration.  Pt provided with medications per providers orders. Pt's labs and vitals were monitored throughout the day. Pt supported emotionally and encouraged to express concerns and questions. Pt educated on medications and alternative stress management techniques.   Pt's safety ensured with 15 minute and environmental checks. Pt currently denies SI/HI and A/V hallucinations. Pt verbally agrees to seek staff if SI/HI or A/VH occurs and to consult with staff before acting on these thoughts. Pt reports I feel better today, I slept great last night. Will continue POC.

## 2015-05-09 NOTE — Progress Notes (Signed)
Patient ID: Joan [name], female   DOB: June 09, 1981, 34 y.o.   MRN: [id number]  Adult Psychoeducational Group Note  Date:  05/09/2015 Time: 09:00am  Group Topic/Focus:  Goals Group:   The focus of this group is to help patients establish daily goals to achieve during treatment and discuss how the patient can incorporate goal setting into their daily lives to aide in recovery.  Participation Level:  Active  Participation Quality:  Appropriate  Affect:  Appropriate  Cognitive:  Appropriate  Insight: Appropriate  Engagement in Group:  Engaged  Modes of Intervention:  Discussion, Education, Orientation and Support  Additional Comments:  Pt able to identify daily goal to accomplish with treatment team today.  Aurora Mask 05/09/2015, 9:39 AM

## 2015-05-09 NOTE — Progress Notes (Signed)
Adult Psychoeducational Group Note  Date:  05/09/2015 Time:  9:33 PM  Group Topic/Focus:  Wrap-Up Group:   The focus of this group is to help patients review their daily goal of treatment and discuss progress on daily workbooks.  Participation Level:  Active  Participation Quality:  Appropriate  Affect:  Appropriate  Cognitive:  Appropriate  Insight: Appropriate  Engagement in Group:  Engaged  Modes of Intervention:  Discussion  Additional Comments: The patient expressed that she attended group.The patient also said that she learned about loving herself.  Octavio Manns 05/09/2015, 9:33 PM

## 2015-05-09 NOTE — [facility] Group Notes (Signed)
[facility] LCSW Group Therapy 05/09/2015 1:15 PM  Type of Therapy: Group Therapy- Feelings about Diagnosis  Participation Level: Active   Participation Quality:  Appropriate  Affect:  Appropriate  Cognitive: Alert and Oriented   Insight:  Developing   Engagement in Therapy: Developing/Improving and Engaged   Modes of Intervention: Clarification, Confrontation, Discussion, Education, Exploration, Limit-setting, Orientation, Problem-solving, Rapport Building, Dance movement psychotherapist, Socialization and Support  Description of Group:   This group will allow patients to explore their thoughts and feelings about diagnoses they have received. Patients will be guided to explore their level of understanding and acceptance of these diagnoses. Facilitator will encourage patients to process their thoughts and feelings about the reactions of others to their diagnosis, and will guide patients in identifying ways to discuss their diagnosis with significant others in their lives. This group will be process-oriented, with patients participating in exploration of their own experiences as well as giving and receiving support and challenge from other group members.  Summary of Progress/Problems:  Pt was interactive with peers in group; she identified feeling scared when she received her diagnosis of PTSD because she attributed that to service members. She expresses that she has learned more about her diagnosis since then and feels that she can accept it more readily. Pt was able to identify her resilient ability to "love herself and others" despite "having my heart broken" as an attribute of herself that she likes.  Therapeutic Modalities:   Cognitive Behavioral Therapy Solution Focused Therapy Motivational Interviewing Relapse Prevention Therapy  Chad Cordial, LCSWA 05/09/2015 4:29 PM

## 2015-05-09 NOTE — Progress Notes (Signed)
D:Patient in the hallway on approach.  Patient states she had a good day today.  Patient states she slept well last night and hopes for the same tonight.  Patient states she was able to talk to her friends who she lived with today and states she cannot go back to live with them.  Patient states she is going to [place] for treatment when discharged.  Patient denies SI/HI and denies AVH.  A: Staff to monitor Q 15 mins for safety.  Encouragement and support offered.  Scheduled medications administered per orders. R: Patient remains safe on the unit.  Patient attended group tonight.  Patient visible on the unit and interacting with peers.  Patient taking administered medications.

## 2015-05-09 NOTE — [facility] Suicide Risk Assessment (Signed)
[facility] INPATIENT:  Family/Significant Other Suicide Prevention Education  Suicide Prevention Education:  Contact Attempts: Buddy Duty, Pt's friend [phone]), has been identified by the patient as the family member/significant other with whom the patient will be residing, and identified as the person(s) who will aid the patient in the event of a mental health crisis.  With written consent from the patient, two attempts were made to provide suicide prevention education, prior to and/or following the patient's discharge.  We were unsuccessful in providing suicide prevention education.  A suicide education pamphlet was given to the patient to share with family/significant other.  Date and time of first attempt: 05/05/15 :00pm Date and time of second attempt: 05/09/15 @ 12:55pm  Elaina Hoops 05/09/2015, 12:56 PM

## 2015-05-09 NOTE — Progress Notes (Signed)
Recreation Therapy Notes  Animal-Assisted Activity (AAA) Program Checklist/Progress Notes Patient Eligibility Criteria Checklist & Daily Group note for Rec Tx Intervention  Date: 10.04.2016 Time: 2:45pm Location: 300 Hall Dayroom    AAA/T Program Assumption of Risk Form signed by Patient/ or Parent Legal Guardian yes  Patient is free of allergies or sever asthma yes  Patient reports no fear of animals yes  Patient reports no history of cruelty to animals yes  Patient understands his/her participation is voluntary yes  Behavioral Response: Did not attend.    Joan [name], LRT/CTRS  Joan [name] 05/09/2015 3:12 PM 

## 2015-05-09 NOTE — Tx Team (Addendum)
Interdisciplinary Treatment Plan Update (Adult) Date: 05/09/2015   Date: 05/09/2015 8:48 AM  Progress in Treatment:  Attending groups: Yes  Participating in groups: Yes  Taking medication as prescribed: Yes  Tolerating medication: Yes  Family/Significant othe contact made: No, CSW attempting to make contact with roommates Patient understands diagnosis: Yes Discussing patient identified problems/goals with staff: Yes  Medical problems stabilized or resolved: Yes  Denies suicidal/homicidal ideation: Yes Patient has not harmed self or Others: Yes   New problem(s) identified: None identified at this time.   Discharge Plan or Barriers: Pt will discharge to friend's home temporarily before going to continued treatment at [facility].  Additional comments: n/a   Reason for Continuation of Hospitalization:  Anxiety Depression Medication stabilization Suicidal ideation  Estimated length of stay: 1 day  Review of initial/current patient goals per problem list:   1.  Goal(s): Patient will participate in aftercare plan  Met:  Yes  Target date: 3-5 days from date of admission   As evidenced by: Patient will participate within aftercare plan AEB aftercare provider and housing plan at discharge being identified.   05/04/15: Pt will return home and follow-up with [facility]  2.  Goal (s): Patient will exhibit decreased depressive symptoms and suicidal ideations.  Met:  Yes  Target date: 3-5 days from date of admission   As evidenced by: Patient will utilize self rating of depression at 3 or below and demonstrate decreased signs of depression or be deemed stable for discharge by MD. 05/04/15: Pt was admitted with symptoms of depression, rating 10/10. Pt continues to present with flat affect and depressive symptoms.  Pt will demonstrate decreased symptoms of depression and rate depression at 3/10 or lower prior to discharge. 05/09/15: Pt rating depression at 7/10; denies SI. 05/10/15:  Pt rates depression at 0/10; denies SI.  3.  Goal(s): Patient will demonstrate decreased signs and symptoms of anxiety.  Met:  Yes  Target date: 3-5 days from date of admission   As evidenced by: Patient will utilize self rating of anxiety at 3 or below and demonstrated decreased signs of anxiety, or be deemed stable for discharge by MD 05/04/15: Pt was admitted with increased levels of anxiety and is currently rating those symptoms highly. Pt will demonstrated decreased symptoms of anxiety and rate it at 3/10 prior to d/c. 05/09/15: Pt rates anxiety at 8/10.  05/10/15: Pt rates anxiety at 3/10.   Attendees:  Patient:    Family:    Physician: Dr. Parke Poisson, MD  05/09/2015 8:48 AM  Nursing: Lars Pinks, RN Case manager  05/09/2015 8:48 AM  Clinical Social Worker Norman Clay, MSW 05/09/2015 8:48 AM  Other: Jake Bathe Liasion 05/09/2015 8:48 AM  Clinical: Marcella Dubs,  RN 05/09/2015 8:48 AM  Other: , RN Charge Nurse 05/09/2015 8:48 AM  Other:     Peri Maris, Latanya Presser MSW

## 2015-05-09 NOTE — Progress Notes (Signed)
Patient ID: SIDNI FUSCO, female   DOB: Mar 31, 1981, 34 y.o.   MRN: [id number] [facility] MD Progress Note  05/09/2015 10:57 AM YARLIN BREISCH  MRN:  [id number]  Subjective: Anala says "I feel better today. I slept great with that Zyprexa one-time dose last night. I feel like the  trazodone is making me feel hung over. Can we try 75?  Objective: Clemma is seen, chart reviewed. Pt is alert/oriented x4, calm, cooperative, and appropriate. Pt reports feeling mildly hung over from the jump from  to  trazodone and wants to do . Pt denies suicidal/homicidal ideation and psychosis and does not appear to be responding to internal stimuli. Pt cites good sleep as of last night and good appetite.   Principal Problem: MDD (major depressive disorder), recurrent severe, without psychosis (HCC)  Diagnosis:   Patient Active Problem List   Diagnosis Date Noted  . MDD (major depressive disorder), recurrent severe, without psychosis (HCC) [F33.2] 05/03/2015  . Major depressive disorder, recurrent, severe without psychotic features (HCC) [F33.2]   . MDD (major depressive disorder), recurrent episode, severe (HCC) [F33.2] 04/11/2015  . PTSD (post-traumatic stress disorder) [F43.10] 04/11/2015   Total Time spent with patient: 15 minutes  Past Medical History:  Past Medical History  Diagnosis Date  . Diabetes mellitus without complication     diet controlled  . Auditory hallucinations     Past Surgical History  Procedure Laterality Date  . Tonsillectomy     Family History: History reviewed. No pertinent family history.  Social History:  History  Alcohol Use No     History  Drug Use No    Social History   Social History  . Marital Status: Married    Spouse Name: N/A  . Number of Children: N/A  . Years of Education: N/A   Social History Main Topics  . Smoking status: Never Smoker   . Smokeless tobacco: None  . Alcohol Use: No  . Drug Use: No  . Sexual Activity: Not Asked    Other Topics Concern  . None   Social History Narrative   Additional Social History:   Sleep: Good.  Appetite: Good   Current Medications: Current Facility-Administered Medications  Medication Dose Route Frequency Provider Last Rate Last Dose  . acetaminophen (TYLENOL) tablet 650 mg  650 mg Oral Q6H PRN Beau Fanny, FNP   650 mg at 05/08/15 7829  . alum & mag hydroxide-simeth (MAALOX/MYLANTA) 200-200-20 MG/5ML suspension 30 mL  30 mL Oral Q4H PRN Beau Fanny, FNP      . ARIPiprazole (ABILIFY) tablet 15 mg  15 mg Oral QHS Sanjuana Kava, NP      . citalopram (CELEXA) tablet 20 mg  20 mg Oral Daily Kerry Hough, PA-C   20 mg at 05/09/15 0741  . hydrOXYzine (ATARAX/VISTARIL) tablet 25 mg  25 mg Oral Q6H PRN Kerry Hough, PA-C   25 mg at 05/08/15 0646  . magnesium hydroxide (MILK OF MAGNESIA) suspension 30 mL  30 mL Oral Daily PRN Beau Fanny, FNP      . prazosin (MINIPRESS) capsule 3 mg  3 mg Oral QHS Craige Cotta, MD   3 mg at 05/08/15 2117  . traZODone (DESYREL) tablet 100 mg  100 mg Oral QHS PRN,MR X 1 Craige Cotta, MD   100 mg at 05/07/15 2111    Lab Results:  Results for orders placed or performed during the hospital encounter of 05/03/15 (from the past 48 hour(s))  Urinalysis, Routine w reflex microscopic (not at [facility])     Status: None   Collection Time: 05/07/15  3:00 PM  Result Value Ref Range   Color, Urine YELLOW YELLOW   APPearance CLEAR CLEAR   Specific Gravity, Urine 1.010 1.005 - 1.030   pH 6.5 5.0 - 8.0   Glucose, UA NEGATIVE NEGATIVE mg/dL   Hgb urine dipstick NEGATIVE NEGATIVE   Bilirubin Urine NEGATIVE NEGATIVE   Ketones, ur NEGATIVE NEGATIVE mg/dL   Protein, ur NEGATIVE NEGATIVE mg/dL   Urobilinogen, UA 0.2 0.0 - 1.0 mg/dL   Nitrite NEGATIVE NEGATIVE   Leukocytes, UA NEGATIVE NEGATIVE    Comment: MICROSCOPIC NOT DONE ON URINES WITH NEGATIVE PROTEIN, BLOOD, LEUKOCYTES, NITRITE, OR GLUCOSE <1000 mg/dL. Performed at [facility]    Physical Findings:  AIMS: Facial and Oral Movements Muscles of Facial Expression: None, normal Lips and Perioral Area: None, normal Jaw: None, normal Tongue: None, normal,Extremity Movements Upper (arms, wrists, hands, fingers): None, normal Lower (legs, knees, ankles, toes): None, normal, Trunk Movements Neck, shoulders, hips: None, normal, Overall Severity Severity of abnormal movements (highest score from questions above): None, normal Incapacitation due to abnormal movements: None, normal Patient's awareness of abnormal movements (rate only patient's report): No Awareness, Dental Status Current problems with teeth and/or dentures?: No Does patient usually wear dentures?: No  CIWA:    COWS:     Musculoskeletal: Strength & Muscle Tone: within normal limits Gait & Station: normal Patient leans: N/A  Psychiatric Specialty Exam: Review of Systems  Psychiatric/Behavioral: Positive for depression. Negative for suicidal ideas and hallucinations. The patient is nervous/anxious. The patient does not have insomnia.   All other systems reviewed and are negative. - denies  Headache, denies vomiting, denies chest pain or shortness of breath  Blood pressure 124/72, pulse 131, temperature 98.1 F (36.7 C), temperature source Oral, resp. rate 18, height  (1.676 m), weight 92.08 kg (203 lb), last menstrual period 04/26/2015.Body mass index is 32.78 kg/(m^2).  General Appearance: improved grooming  Eye Contact::  Good  Speech:  Normal Rate  Volume:  Normal  Mood:  Euthymic  Affect:    Reactive, appropriate, congruent  Thought Process:  Linear, logical, goal-directed  Orientation:  Full (Time, Place, and Person)  Thought Content:  no delusions, reports occasional fleeting auditory hallucinations, hearing her father's voice, but is not internally  preoccupied   Suicidal Thoughts:  No- at this time denies plan or intention of hurting self and contracts for safety on  unit.   Homicidal Thoughts:  No  Memory:  recent and remote grossly intact   Judgement:  Other:  Fair yet improving  Insight:  improving  Psychomotor Activity:  Normal  Concentration:  Good  Recall:  Good  Fund of Knowledge:Good  Language: Good  Akathisia:  Negative  Handed:  Right  AIMS (if indicated):     Assets:  Communication Skills Desire for Improvement Resilience  ADL's:   improved  Cognition: WNL  Sleep:  Number of Hours: 6.5   Assessment - some ongoing depression, but generally improved compared to admission. Future oriented and wanting to discuss follow up options with CSW, to include going to [facility] after D/C. She has occasional auditory halls of her father calling her but does not present psychotic, and had a nightmare last night, resulting in poor sleep . She has been better able to avoid self harm, and  Is denying any plan or intention of self cutting at this  time. Tolerating medications well. Able to contract for safety on unit at this time.  Treatment Plan Summary: Daily contact with patient to assess and evaluate symptoms and progress in treatment, Medication management, Plan inpatient treatment  and medications as below  Medications: -Continue Abilify 15mg  daily moved to evening per Aggie NP on 05/08/15 to ddress depression, mood disorder, and persistent  psychotic symptoms/insomnia.  -Continue Celexa 20 mgrs daily to address depression, anxiety, PTSD  -Continue  Minipress to 3  mgrs QHS to address PTSD related nightmares - we reviewed side effects, risk of orthostasis, dizziness as side effect. -Continue Vistaril PRNS for severe anxiety as needed  -Lower Trazodone to 75mg  qhs prn insomnia as pt felt "hung over" when going from 50 to 100.  Non-pharmacologic:  -Encourage ongoing milieu, group participation to address coping skills and symptom reduction  -Treatment team /CSW working on disposition options. Reviewed most recent labs.  Beau Fanny, FNP-BC 05/09/2015, 10:57 AM Agree with Progress Note as above  Nehemiah Massed, MD

## 2015-05-10 MED ORDER — CITALOPRAM HYDROBROMIDE 20 MG PO TABS: 20.0000 mg | ORAL_TABLET | Freq: Every day | ORAL | Status: DC

## 2015-05-10 MED ORDER — TRAZODONE HCL 150 MG PO TABS: 75.0000 mg | ORAL_TABLET | Freq: Every evening | ORAL | Status: DC | PRN

## 2015-05-10 MED ORDER — PRAZOSIN HCL 1 MG PO CAPS: 3.0000 mg | ORAL_CAPSULE | Freq: Every day | ORAL | Status: DC

## 2015-05-10 MED ORDER — ARIPIPRAZOLE 15 MG PO TABS: 15.0000 mg | ORAL_TABLET | Freq: Every day | ORAL | Status: DC

## 2015-05-10 MED ORDER — HYDROXYZINE HCL 25 MG PO TABS: 25.0000 mg | ORAL_TABLET | Freq: Four times a day (QID) | ORAL | Status: DC | PRN

## 2015-05-10 NOTE — Progress Notes (Signed)
D) Pt. Was d/c to care of self, with transportation from a friend.  Pt. Denied SI/HI  And denied A/V hallucinations.  Pt. Denied pain. Pt. Stated readiness for d/c.  Affect and mood appears bright.  A)  AVS reviewed. Medications reviewed.  Prescriptions provided. Belongings returned.  Safety plan reviewed.  R) Pt. Receptive and verbalized understanding of instructions.  Pt. Escorted to lobby.

## 2015-05-10 NOTE — Progress Notes (Signed)
Recreation Therapy Notes   Date: 10.05.2016 Time: 9:30am Location: 300 Hall Group Room   Group Topic: Stress Management  Goal Area(s) Addresses:  Patient will actively participate in stress management techniques presented during session.   Behavioral Response: Did not attend.   Glennda Weatherholtz L Peace Jost, LRT/CTRS        Meira Wahba L 05/10/2015 1:14 PM 

## 2015-05-10 NOTE — [facility] Group Notes (Signed)
[facility] LCSW Aftercare Discharge Planning Group Note  05/10/2015 8:45 AM  Participation Quality: Alert, Appropriate and Oriented  Mood/Affect: Euphoric  Depression Rating: 0  Anxiety Rating: 0  Thoughts of Suicide: Pt denies SI/HI  Will you contract for safety? Yes  Current AVH: Pt denies  Plan for Discharge/Comments: Pt attended discharge planning group and actively participated in group. CSW discussed suicide prevention education with the group and encouraged them to discuss discharge planning and any relevant barriers. Pt was observed to be restless, talkative, and euphoric. She denies thoughts of self-harm which has been pervasive in her recent history. She describes being ready for discharge.  Transportation Means: Pt reports access to transportation  Supports: No supports mentioned at this time  Chad Cordial, LCSWA 05/10/2015 9:27 AM

## 2015-05-10 NOTE — Discharge Summary (Signed)
Physician Discharge Summary Note  Patient:  Joan [name] is an 34 y.o., female  MRN:  [id number]  DOB:  [birth year]  Patient phone:  [phone] (home)   Patient address:   [address] [place] [address],   Total Time spent with patient: Greater than 30  minutes  Date of Admission:  05/03/2015  Date of Discharge: 05-09-15  Reason for Admission:  Depression  Principal Problem: MDD (major depressive disorder), recurrent severe, without psychosis [facility])  Discharge Diagnoses: Patient Active Problem List   Diagnosis Date Noted  . MDD (major depressive disorder), recurrent severe, without psychosis (HCC) [F33.2] 05/03/2015  . Major depressive disorder, recurrent, severe without psychotic features (HCC) [F33.2]   . MDD (major depressive disorder), recurrent episode, severe (HCC) [F33.2] 04/11/2015  . PTSD (post-traumatic stress disorder) [F43.10] 04/11/2015   Musculoskeletal: Strength & Muscle Tone: within normal limits Gait & Station: normal Patient leans: N/A  Psychiatric Specialty Exam: Physical Exam  Vitals reviewed. Psychiatric: Her mood appears anxious. Thought content is not paranoid. She does not exhibit a depressed mood. She expresses no homicidal and no suicidal ideation.    Review of Systems  All other systems reviewed and are negative.   Blood pressure 112/75, pulse 112, temperature 98.5 F (36.9 C), temperature source Oral, resp. rate 20, height  (1.676 m), weight 92.08 kg (203 lb), last menstrual period 04/26/2015.Body mass index is 32.78 kg/(m^2).   See Ms's SRa       Have you used any form of tobacco in the last 30 days? (Cigarettes, Smokeless Tobacco, Cigars, and/or Pipes): No  Has this patient used any form of tobacco in the last 30 days? (Cigarettes, Smokeless Tobacco, Cigars, and/or Pipes) No  Past Medical History:  Past Medical History  Diagnosis Date  . Diabetes mellitus without complication     diet controlled  . Auditory  hallucinations     Past Surgical History  Procedure Laterality Date  . Tonsillectomy     Family History: History reviewed. No pertinent family history.  Social History:  History  Alcohol Use No     History  Drug Use No    Social History   Social History  . Marital Status: Married    Spouse Name: N/A  . Number of Children: N/A  . Years of Education: N/A   Social History Main Topics  . Smoking status: Never Smoker   . Smokeless tobacco: None  . Alcohol Use: No  . Drug Use: No  . Sexual Activity: Not Asked   Other Topics Concern  . None   Social History Narrative   Risk to Self: Is patient at risk for suicide?: No Risk to Others: No Prior Inpatient Therapy: Yes Prior Outpatient Therapy: Yes  Level of Care:  OP  Hospital Course:  Patient is a 34 year old female, known to [place] from prior admission to our unit (* 9/6 through 04/19/15). At that time she was diagnosed with MDD and with PTSD. She improved on Celexa, Minipress, Abilify. She states that after returning home she was doing well for 2 weeks or so, but then started havingmore intense memories and flashbacks about childhood trauma, andover the last few days started having some auditory hallucinations ( states " I cannot hear it clearly, I don't know what it is saying".) Because of this exacerbation of symptoms started having thoughts of self cutting " to make all that go away". She communicated with her therapist , and he recommended for her to come  to ED due to severity of symptoms.  Kaeley was re-admitted to the adult unit few weeks after being discharged from this hospital for worsening symptoms of depression & PTSD related to childhood trauma. She stated that she was thinking about cutting herself again to hurt physically to deal with her mental pain. During her re-admission assessment, she was re-evaluated and her symptoms were identified. Medication management was discussed and initiated targeting those presenting  symptoms. She was re-oriented to the unit & encouraged to participate in the unit programming. She presented no other significant pre-existing medical issues that required treatment & or monitoring.   While a patient in this hospital, Wilberta was evaluated each day by a clinical provider to assure her response to her treatment regimen. As the day goes by, improvement was noted by her reports of decreasing symptoms, improved sleep, affect, medication tolerance, behavior, and participation in the unit programming.  She was required on daily basis to complete a self inventory asssessment noting mood, mental status, pain, new symptoms, anxiety and concerns. Diann's symptoms responded well to her treatment regimen also being in a therapeutic and supportive environment assisted in her mood stability. Brienne did present appropriate behavior & was motivated for recovery. She worked closely with her treatment team and case manager to develop a discharge plan with appropriate goals to maintain mood stability after discharge. Coping skills, problem solving as well as relaxation therapies were also part of the unit programming.  On this day of her hospital discharge, Mata was in much improved condition than upon admission. Her symptoms were reported as significantly decreased or resolved completely. Upon discharge, she denies SIHI & voiced no AVH. She was motivated to continue taking medication with a goal of continued improvement in mental health. She is being discharged home with a plan to follow up as noted below. She was medicated & discharged on; Abilify 15 mg for mood stability, Citalopram 20 mg for depression, Hydroxyzine 25 mg for anxiety, Prazosin 3 mg for nightmares & trazodone 75 mg for insomnia. Azalea was picked by a friend with all pertinent belongings, in no apparent distress.      Consults:  psychiatry  Significant Diagnostic Studies:  labs: CBC with diff, CMP, UDS, toxicology tests, U/A, results reviewed,  stable  Discharge Vitals:   Blood pressure 112/75, pulse 112, temperature 98.5 F (36.9 C), temperature source Oral, resp. rate 20, height 5\' 6"  (1.676 m), weight 92.08 kg (203 lb), last menstrual period 04/26/2015. Body mass index is 32.78 kg/(m^2). Lab Results:   Results for orders placed or performed during the hospital encounter of 05/03/15 (from the past 72 hour(s))  Urinalysis, Routine w reflex microscopic (not at [facility])     Status: None   Collection Time: 05/07/15  3:00 PM  Result Value Ref Range   Color, Urine YELLOW YELLOW   APPearance CLEAR CLEAR   Specific Gravity, Urine 1.010 1.005 - 1.030   pH 6.5 5.0 - 8.0   Glucose, UA NEGATIVE NEGATIVE mg/dL   Hgb urine dipstick NEGATIVE NEGATIVE   Bilirubin Urine NEGATIVE NEGATIVE   Ketones, ur NEGATIVE NEGATIVE mg/dL   Protein, ur NEGATIVE NEGATIVE mg/dL   Urobilinogen, UA 0.2 0.0 - 1.0 mg/dL   Nitrite NEGATIVE NEGATIVE   Leukocytes, UA NEGATIVE NEGATIVE    Comment: MICROSCOPIC NOT DONE ON URINES WITH NEGATIVE PROTEIN, BLOOD, LEUKOCYTES, NITRITE, OR GLUCOSE <1000 mg/dL. Performed at [facility]     Physical Findings: AIMS: Facial and Oral Movements Muscles of Facial Expression: None,  normal Lips and Perioral Area: None, normal Jaw: None, normal Tongue: None, normal,Extremity Movements Upper (arms, wrists, hands, fingers): None, normal Lower (legs, knees, ankles, toes): None, normal, Trunk Movements Neck, shoulders, hips: None, normal, Overall Severity Severity of abnormal movements (highest score from questions above): None, normal Incapacitation due to abnormal movements: None, normal Patient's awareness of abnormal movements (rate only patient's report): No Awareness, Dental Status Current problems with teeth and/or dentures?: No Does patient usually wear dentures?: No  CIWA:    COWS:     See Psychiatric Specialty Exam and Suicide Risk Assessment completed by Attending Physician prior to  discharge.  Discharge destination:  Home  Is patient on multiple antipsychotic therapies at discharge:  No   Has Patient had three or more failed trials of antipsychotic monotherapy by history:  No  Recommended Plan for Multiple Antipsychotic Therapies: NA    Medication List    TAKE these medications      Indication   ARIPiprazole 15 MG tablet  Commonly known as:  ABILIFY  Take 1 tablet (15 mg total) by mouth at bedtime. For mood control   Indication:  Mood control     citalopram 20 MG tablet  Commonly known as:  CELEXA  Take 1 tablet (20 mg total) by mouth daily. For depression   Indication:  Depression     hydrOXYzine 25 MG tablet  Commonly known as:  ATARAX/VISTARIL  Take 1 tablet (25 mg total) by mouth every 6 (six) hours as needed for anxiety.   Indication:  Anxiety     prazosin 1 MG capsule  Commonly known as:  MINIPRESS  Take 3 capsules (3 mg total) by mouth at bedtime. For nightmares   Indication:  Nightmares R/T PTSD     traZODone 150 MG tablet  Commonly known as:  DESYREL  Take 0.5 tablets (75 mg total) by mouth at bedtime as needed for sleep.   Indication:  Trouble Sleeping       Follow-up Information    Follow up with [facility] On 05/17/2015.   Why:  Please arrive at 10:00am for your intial assessment at [facility].   Contact information:   [address] [place], [place] [address] Tel: [phone]     Follow-up recommendations:  Activity:  As tolerated Diet: As recommended by your primary care doctor. Keep all scheduled follow-up appointments as recommended.  Comments: Take all your medications as prescribed by your mental healthcare provider. Report any adverse effects and or reactions from your medicines to your outpatient provider promptly. Patient is instructed and cautioned to not engage in alcohol and or illegal drug use while on prescription medicines. In the event of worsening symptoms, patient is instructed  to call the crisis hotline, 911 and or go to the nearest ED for appropriate evaluation and treatment of symptoms. Follow-up with your primary care provider for your other medical issues, concerns and or health care needs.   Total Discharge Time: Greater than 30 minutes Signed: Armandina Stammer I PMHNP, FNP-BC 05/10/2015, 2:04 PM  Patient seen, Suicide Assessment Completed.  Disposition Plan Reviewed

## 2015-05-10 NOTE — Progress Notes (Signed)
  [facility] Adult Case Management Discharge Plan :  Will you be returning to the same living situation after discharge:  No. Pt discharging to friend's home before presenting for continued treatment at [facility] At discharge, do you have transportation home?: Yes,  Pt's friend to provide transportation  Do you have the ability to pay for your medications: Yes,  Pt provided with prescriptions  Release of information consent forms completed and in the chart;  Patient's signature needed at discharge.  Patient to Follow up at: Follow-up Information    Follow up with [facility] On 05/17/2015.   Why:  Please arrive at 10:00am for your intial assessment at [facility].   Contact information:   [address] [facility] [place], [place] [address] Tel: [phone]      Patient denies SI/HI: Yes,  Pt denies    Safety Planning and Suicide Prevention discussed: Yes,  with Pt; 2 contact attempts made with roommates  Have you used any form of tobacco in the last 30 days? (Cigarettes, Smokeless Tobacco, Cigars, and/or Pipes): No  Has patient been referred to the Quitline?: N/A patient is not a smoker  Elaina Hoops 05/10/2015, 10:15 AM

## 2015-05-10 NOTE — [facility] Suicide Risk Assessment (Signed)
[facility] Discharge Suicide Risk Assessment   Demographic Factors:  34 year old female, no children, currently not employed   Total Time spent with patient: 30 minutes  Musculoskeletal: Strength & Muscle Tone: within normal limits Gait & Station: normal Patient leans: N/A  Psychiatric Specialty Exam: Physical Exam  ROS  Blood pressure 112/75, pulse 112, temperature 98.5 F (36.9 C), temperature source Oral, resp. rate 20, height  (1.676 m), weight 203 lb (92.08 kg), last menstrual period 04/26/2015.Body mass index is 32.78 kg/(m^2).  General Appearance: Well Groomed  Patent attorney::  Good  Speech:  Normal Rate409  Volume:  Normal  Mood:  Euthymic  Affect:  Appropriate and Full Range  Thought Process:  Linear  Orientation:  Full (Time, Place, and Person)  Thought Content:  denies hallucinations, no delusions  Suicidal Thoughts:  No- denies any suicidal ideations, denies any self injurious ideations  Homicidal Thoughts:  No  Memory:  recent and remote grossly intact   Judgement:  Other:  improving  Insight:  improving  Psychomotor Activity:  Normal  Concentration:  Good  Recall:  Good  Fund of Knowledge:Good  Language: Good  Akathisia:  Negative  Handed:  Right  AIMS (if indicated):     Assets:  Desire for Improvement Resilience  Sleep:  Number of Hours: 5.25  Cognition: WNL  ADL's:   Improved    Have you used any form of tobacco in the last 30 days? (Cigarettes, Smokeless Tobacco, Cigars, and/or Pipes): No  Has this patient used any form of tobacco in the last 30 days? (Cigarettes, Smokeless Tobacco, Cigars, and/or Pipes) No  Mental Status Per Nursing Assessment::   On Admission:  Suicidal ideation indicated by patient, Suicidal ideation indicated by others, Self-harm thoughts  Current Mental Status by Physician: At this time patient is improved compared to her admission- she presents better groomed, she is euthymic, her affect is more reactive and brighter, no  thought disorder, hallucinations have completely resolved and she is not presenting with any delusions, no SI, no HI, future oriented .  Loss Factors: Recently found out friends she was living with will not allow her to return there   Historical Factors: Prior psychiatric admission one month ago, history of depression, history PTSD   Risk Reduction Factors:   Religious beliefs about death and Positive coping skills or problem solving skills  Continued Clinical Symptoms:  As noted, patient currently significantly improved   Cognitive Features That Contribute To Risk:  No gross cognitive deficits noted upon discharge. Is alert , attentive, and oriented x 3   Suicide Risk:  Mild:  Suicidal ideation of limited frequency, intensity, duration, and specificity.  There are no identifiable plans, no associated intent, mild dysphoria and related symptoms, good self-control (both objective and subjective assessment), few other risk factors, and identifiable protective factors, including available and accessible social support.  Principal Problem: MDD (major depressive disorder), recurrent severe, without psychosis [facility]) Discharge Diagnoses:  Patient Active Problem List   Diagnosis Date Noted  . MDD (major depressive disorder), recurrent severe, without psychosis (HCC) [F33.2] 05/03/2015  . Major depressive disorder, recurrent, severe without psychotic features (HCC) [F33.2]   . MDD (major depressive disorder), recurrent episode, severe (HCC) [F33.2] 04/11/2015  . PTSD (post-traumatic stress disorder) [F43.10] 04/11/2015    Follow-up Information    Follow up with [facility] On 05/09/2015.   Why:  at 4:20pm for medication management with Dr. Elvera Bicker information:   [address]  [place],  [place] [address]  Phone: [phone]  Fax: [phone]      Follow up with [facility] On 05/12/2015.   Why:  at 2:00pm for therapy with Murriel Hopper information:   [address]  [place], [place] [address]  Phone: [phone]  Fax: [phone]      Plan Of Care/Follow-up recommendations:  Activity:  as tolerated  Diet:  Regular Tests:  NA Other:  see below   Is patient on multiple antipsychotic therapies at discharge:  No   Has Patient had three or more failed trials of antipsychotic monotherapy by history:  No  Recommended Plan for Multiple Antipsychotic Therapies: NA   Patient is leaving in good spirits . Plans to go to [facility] in [place], after which she plans to go to [employer].      Conlin Brahm 05/10/2015, 9:26 AM

## 2015-06-12 ENCOUNTER — Emergency Department (HOSPITAL_COMMUNITY)
Admission: EM | Admit: 2015-06-12 | Discharge: 2015-06-12 | Disposition: A | Discharge status: Home / Self Care | Payer: BLUE CROSS/BLUE SHIELD | Attending: Emergency Medicine | Admitting: Emergency Medicine

## 2015-06-12 ENCOUNTER — Encounter (HOSPITAL_COMMUNITY): Payer: Self-pay | Admitting: Emergency Medicine

## 2015-06-12 DIAGNOSIS — E119 Type 2 diabetes mellitus without complications: Secondary | ICD-10-CM | POA: Insufficient documentation

## 2015-06-12 DIAGNOSIS — Z880 Allergy status to penicillin: Secondary | ICD-10-CM | POA: Insufficient documentation

## 2015-06-12 DIAGNOSIS — J069 Acute upper respiratory infection, unspecified: Secondary | ICD-10-CM

## 2015-06-12 DIAGNOSIS — Z23 Encounter for immunization: Secondary | ICD-10-CM | POA: Insufficient documentation

## 2015-06-12 DIAGNOSIS — H9203 Otalgia, bilateral: Secondary | ICD-10-CM | POA: Insufficient documentation

## 2015-06-12 DIAGNOSIS — B9789 Other viral agents as the cause of diseases classified elsewhere: Secondary | ICD-10-CM

## 2015-06-12 DIAGNOSIS — Z79899 Other long term (current) drug therapy: Secondary | ICD-10-CM | POA: Insufficient documentation

## 2015-06-12 DIAGNOSIS — R110 Nausea: Secondary | ICD-10-CM | POA: Insufficient documentation

## 2015-06-12 LAB — RAPID STREP SCREEN (MED CTR MEBANE ONLY): Streptococcus, Group A Screen (Direct): NEGATIVE

## 2015-06-12 MED ORDER — TETANUS-DIPHTH-ACELL PERTUSSIS 5-2.5-18.5 LF-MCG/0.5 IM SUSP
0.5000 mL | Freq: Once | INTRAMUSCULAR | Status: AC
  Administered 2015-06-12: 0.5 mL via INTRAMUSCULAR

## 2015-06-12 NOTE — Discharge Instructions (Signed)
Upper Respiratory Infection, Adult Most upper respiratory infections (URIs) are a viral infection of the air passages leading to the lungs. A URI affects the nose, throat, and upper air passages. The most common type of URI is nasopharyngitis and is typically referred to as "the common cold." URIs run their course and usually go away on their own. Most of the time, a URI does not require medical attention, but sometimes a bacterial infection in the upper airways can follow a viral infection. This is called a secondary infection. Sinus and middle ear infections are common types of secondary upper respiratory infections. Bacterial pneumonia can also complicate a URI. A URI can worsen asthma and chronic obstructive pulmonary disease (COPD). Sometimes, these complications can require emergency medical care and may be life threatening.  CAUSES Almost all URIs are caused by viruses. A virus is a type of germ and can spread from one person to another.  RISKS FACTORS You may be at risk for a URI if:   You smoke.   You have chronic heart or lung disease.  You have a weakened defense (immune) system.   You are very young or very old.   You have nasal allergies or asthma.  You work in crowded or poorly ventilated areas.  You work in health care facilities or schools. SIGNS AND SYMPTOMS  Symptoms typically develop 2-3 days after you come in contact with a cold virus. Most viral URIs last 7-10 days. However, viral URIs from the influenza virus (flu virus) can last 14-18 days and are typically more severe. Symptoms may include:   Runny or stuffy (congested) nose.   Sneezing.   Cough.   Sore throat.   Headache.   Fatigue.   Fever.   Loss of appetite.   Pain in your forehead, behind your eyes, and over your cheekbones (sinus pain).  Muscle aches.  DIAGNOSIS  Your health care provider may diagnose a URI by:  Physical exam.  Tests to check that your symptoms are not due to  another condition such as:  Strep throat.  Sinusitis.  Pneumonia.  Asthma. TREATMENT  A URI goes away on its own with time. It cannot be cured with medicines, but medicines may be prescribed or recommended to relieve symptoms. Medicines may help:  Reduce your fever.  Reduce your cough.  Relieve nasal congestion. HOME CARE INSTRUCTIONS   Take medicines only as directed by your health care provider.   Gargle warm saltwater or take cough drops to comfort your throat as directed by your health care provider.  Use a warm mist humidifier or inhale steam from a shower to increase air moisture. This may make it easier to breathe.  Drink enough fluid to keep your urine clear or pale yellow.   Eat soups and other clear broths and maintain good nutrition.   Rest as needed.   Return to work when your temperature has returned to normal or as your health care provider advises. You may need to stay home longer to avoid infecting others. You can also use a face mask and careful hand washing to prevent spread of the virus.  Increase the usage of your inhaler if you have asthma.   Do not use any tobacco products, including cigarettes, chewing tobacco, or electronic cigarettes. If you need help quitting, ask your health care provider. PREVENTION  The best way to protect yourself from getting a cold is to practice good hygiene.   Avoid oral or hand contact with people with cold  symptoms.   Wash your hands often if contact occurs.  There is no clear evidence that vitamin C, vitamin E, echinacea, or exercise reduces the chance of developing a cold. However, it is always recommended to get plenty of rest, exercise, and practice good nutrition.  SEEK MEDICAL CARE IF:   You are getting worse rather than better.   Your symptoms are not controlled by medicine.   You have chills.  You have worsening shortness of breath.  You have brown or red mucus.  You have yellow or brown nasal  discharge.  You have pain in your face, especially when you bend forward.  You have a fever.  You have swollen neck glands.  You have pain while swallowing.  You have white areas in the back of your throat. SEEK IMMEDIATE MEDICAL CARE IF:   You have severe or persistent:  Headache.  Ear pain.  Sinus pain.  Chest pain.  You have chronic lung disease and any of the following:  Wheezing.  Prolonged cough.  Coughing up blood.  A change in your usual mucus.  You have a stiff neck.  You have changes in your:  Vision.  Hearing.  Thinking.  Mood. MAKE SURE YOU:   Understand these instructions.  Will watch your condition.  Will get help right away if you are not doing well or get worse.   This information is not intended to replace advice given to you by your health care provider. Make sure you discuss any questions you have with your health care provider.   Return to the Emergency Department if you experience worsening of your symptoms, difficulty breathing or swallowing, chest pain, blurry vision, vomiting, fever. Follow up with you psychiatrist for long term management of psychiatric disorders. If you have future thoughts of harming yourself please return to the Emergency Department for immediate treatment. Take over the counter Mucinex as needed for decongestant. Take ibuprofen as needed for pain.

## 2015-06-12 NOTE — ED Provider Notes (Signed)
CSN: [id number]     Arrival date & time 06/12/15  1450 History  By signing my name below, I, Joan [name], attest that this documentation has been prepared under the direction and in the presence of News CorporationSamantha Jethro Radke PA-C. Electronically Signed: Sonum [name], Neurosurgeoncribe. 06/12/2015. 3:35 PM.    Chief Complaint  Patient presents with  . URI    The history is provided by the patient. No language interpreter was used.     HPI Comments: Joan [name] is a 34 y.o. female who presents to the Emergency Department complaining of a constant sore throat with associated non-productive cough, bilateral otalgia, and nausea that began last night. She denies fever, vomiting, diarrhea.   She has a secondary complaint of a left wrist wound that occurred 6 days ago. Patient states the wound was self-inflicted with a metal razor and believes it is now infected. She states she recently stopped taking several psychiatric medications 1 month ago since she no longer has insurance to see her psychiatrist.    Past Medical History  Diagnosis Date  . Diabetes mellitus without complication (HCC)     diet controlled  . Auditory hallucinations    Past Surgical History  Procedure Laterality Date  . Tonsillectomy     No family history on file. Social History  Substance Use Topics  . Smoking status: Never Smoker   . Smokeless tobacco: None  . Alcohol Use: No   OB History    No data available     Review of Systems  Constitutional: Negative for fever.  HENT: Positive for ear pain and sore throat. Negative for trouble swallowing.   Respiratory: Positive for cough.   Gastrointestinal: Positive for nausea. Negative for vomiting and diarrhea.  All other systems reviewed and are negative.     Allergies  Ciprofloxacin; Ibuprofen; Penicillins; and Sulfa antibiotics  Home Medications   Prior to Admission medications   Medication Sig Start Date End Date Taking? Authorizing Provider  ARIPiprazole (ABILIFY) 15  MG tablet Take 1 tablet (15 mg total) by mouth at bedtime. For mood control 05/10/15   Sanjuana KavaAgnes I Nwoko, NP  citalopram (CELEXA) 20 MG tablet Take 1 tablet (20 mg total) by mouth daily. For depression 05/10/15   Sanjuana KavaAgnes I Nwoko, NP  hydrOXYzine (ATARAX/VISTARIL) 25 MG tablet Take 1 tablet (25 mg total) by mouth every 6 (six) hours as needed for anxiety. 05/10/15   Sanjuana KavaAgnes I Nwoko, NP  prazosin (MINIPRESS) 1 MG capsule Take 3 capsules (3 mg total) by mouth at bedtime. For nightmares 05/10/15   Sanjuana KavaAgnes I Nwoko, NP  traZODone (DESYREL) 150 MG tablet Take 0.5 tablets (75 mg total) by mouth at bedtime as needed for sleep. 05/10/15   Sanjuana KavaAgnes I Nwoko, NP   BP 147/86 mmHg  Pulse 101  Temp(Src) 97.8 F (36.6 C) (Oral)  Resp 16  SpO2 100%  LMP 06/12/2015 Physical Exam  Constitutional: She is oriented to person, place, and time. She appears well-developed and well-nourished. No distress.  HENT:  Head: Normocephalic and atraumatic.  Right Ear: Hearing, tympanic membrane, external ear and ear canal normal.  Left Ear: Hearing, tympanic membrane, external ear and ear canal normal.  Nose: Nose normal.  Mouth/Throat: Oropharynx is clear and moist and mucous membranes are normal. No uvula swelling. No oropharyngeal exudate, posterior oropharyngeal edema, posterior oropharyngeal erythema or tonsillar abscesses.  Eyes: Conjunctivae are normal. Right eye exhibits no discharge. Left eye exhibits no discharge. No scleral icterus.  Neck: Neck supple.  Cardiovascular: Normal rate.  Pulmonary/Chest: Effort normal.  Lymphadenopathy:    She has no cervical adenopathy.  Neurological: She is alert and oriented to person, place, and time. Coordination normal.  Skin: Skin is warm and dry. No rash noted. She is not diaphoretic. No erythema. No pallor.  Evidence of healing wound on left wrist. Horizontal scar on volar surface of left wrist. No swelling, drainage, erythema. No sign of infection. NTTP.   Psychiatric: She has a normal  mood and affect. Her behavior is normal.  Nursing note and vitals reviewed.   ED Course  Procedures (including critical care time)  DIAGNOSTIC STUDIES: Oxygen Saturation is 100% on RA, normal by my interpretation.    COORDINATION OF CARE: 3:41 PM Discussed treatment plan with pt at bedside and pt agreed to plan.   Labs Review Labs Reviewed  RAPID STREP SCREEN (NOT AT [facility])  CULTURE, GROUP A STREP    Imaging Review No results found. I have personally reviewed and evaluated these images and lab results as part of my medical decision-making.   EKG Interpretation None      MDM   Final diagnoses:  Viral URI with cough    34 year old female presents with sore throat, bilateral otalgia, dry cough onset this morning. No difficulty breathing or swallowing. No fever. TMs clear bilaterally. Rapid strep test negative. Patient does not meet any Centor criteria. Lungs clear to auscultation bilaterally. Cough is nonproductive. Suspect this is a viral illness. We'll treat symptomatically. With rest and hydration. Take NSAIDs as needed for pain.  Healing wound on patient's left wrist that was self-inflicted. No current suicidal or homicidal ideations. Patient has history of psychiatric illness and took herself off of for home medications 1 month ago. Patient is encouraged to follow up with her psychiatrist for medication management adjustment. Denies hallucinations. Discussed treatment plan with patient who is agreeable. Return precautions outlined in patient discharge instructions.  I personally performed the services described in this documentation, which was scribed in my presence. The recorded information has been reviewed and is accurate.     Lester Kinsman Nellie, PA-C 06/13/15 1524  Dione Booze, MD 06/14/15 [phone]

## 2015-06-12 NOTE — ED Notes (Signed)
Per pt, states cold symptoms-sores in mouth that appeared last night-cut on left wrist which is infected

## 2015-06-14 LAB — CULTURE, GROUP A STREP: Strep A Culture: NEGATIVE

## 2015-08-17 ENCOUNTER — Other Ambulatory Visit: Payer: Self-pay | Admitting: Obstetrics & Gynecology

## 2016-05-08 DIAGNOSIS — R109 Unspecified abdominal pain: Secondary | ICD-10-CM | POA: Diagnosis not present

## 2016-05-08 DIAGNOSIS — Z3202 Encounter for pregnancy test, result negative: Secondary | ICD-10-CM | POA: Diagnosis not present

## 2016-06-11 DIAGNOSIS — B9789 Other viral agents as the cause of diseases classified elsewhere: Secondary | ICD-10-CM | POA: Diagnosis not present

## 2016-06-11 DIAGNOSIS — R52 Pain, unspecified: Secondary | ICD-10-CM | POA: Diagnosis not present

## 2016-06-11 DIAGNOSIS — J069 Acute upper respiratory infection, unspecified: Secondary | ICD-10-CM | POA: Diagnosis not present

## 2016-06-11 DIAGNOSIS — R030 Elevated blood-pressure reading, without diagnosis of hypertension: Secondary | ICD-10-CM | POA: Diagnosis not present

## 2016-06-24 DIAGNOSIS — F4310 Post-traumatic stress disorder, unspecified: Secondary | ICD-10-CM | POA: Diagnosis not present

## 2016-06-24 DIAGNOSIS — F3341 Major depressive disorder, recurrent, in partial remission: Secondary | ICD-10-CM | POA: Diagnosis not present

## 2016-06-25 DIAGNOSIS — E1165 Type 2 diabetes mellitus with hyperglycemia: Secondary | ICD-10-CM | POA: Diagnosis not present

## 2016-06-26 DIAGNOSIS — R748 Abnormal levels of other serum enzymes: Secondary | ICD-10-CM | POA: Diagnosis not present

## 2016-06-26 DIAGNOSIS — R809 Proteinuria, unspecified: Secondary | ICD-10-CM | POA: Diagnosis not present

## 2016-06-26 DIAGNOSIS — I10 Essential (primary) hypertension: Secondary | ICD-10-CM | POA: Diagnosis not present

## 2016-06-26 DIAGNOSIS — E1129 Type 2 diabetes mellitus with other diabetic kidney complication: Secondary | ICD-10-CM | POA: Diagnosis not present

## 2016-06-26 DIAGNOSIS — E1165 Type 2 diabetes mellitus with hyperglycemia: Secondary | ICD-10-CM | POA: Diagnosis not present

## 2016-07-10 DIAGNOSIS — R748 Abnormal levels of other serum enzymes: Secondary | ICD-10-CM | POA: Diagnosis not present

## 2016-07-10 DIAGNOSIS — E1165 Type 2 diabetes mellitus with hyperglycemia: Secondary | ICD-10-CM | POA: Diagnosis not present

## 2016-07-10 DIAGNOSIS — R809 Proteinuria, unspecified: Secondary | ICD-10-CM | POA: Diagnosis not present

## 2016-07-10 DIAGNOSIS — E1129 Type 2 diabetes mellitus with other diabetic kidney complication: Secondary | ICD-10-CM | POA: Diagnosis not present

## 2016-07-11 DIAGNOSIS — F329 Major depressive disorder, single episode, unspecified: Secondary | ICD-10-CM | POA: Diagnosis not present

## 2016-07-11 DIAGNOSIS — Z915 Personal history of self-harm: Secondary | ICD-10-CM | POA: Diagnosis not present

## 2016-07-11 DIAGNOSIS — R309 Painful micturition, unspecified: Secondary | ICD-10-CM | POA: Diagnosis not present

## 2016-07-11 DIAGNOSIS — Z882 Allergy status to sulfonamides status: Secondary | ICD-10-CM | POA: Diagnosis not present

## 2016-07-11 DIAGNOSIS — R1031 Right lower quadrant pain: Secondary | ICD-10-CM | POA: Diagnosis not present

## 2016-07-11 DIAGNOSIS — N390 Urinary tract infection, site not specified: Secondary | ICD-10-CM | POA: Diagnosis not present

## 2016-07-11 DIAGNOSIS — Z7984 Long term (current) use of oral hypoglycemic drugs: Secondary | ICD-10-CM | POA: Diagnosis not present

## 2016-07-11 DIAGNOSIS — A5901 Trichomonal vulvovaginitis: Secondary | ICD-10-CM | POA: Diagnosis not present

## 2016-07-11 DIAGNOSIS — F4310 Post-traumatic stress disorder, unspecified: Secondary | ICD-10-CM | POA: Diagnosis not present

## 2016-07-11 DIAGNOSIS — R110 Nausea: Secondary | ICD-10-CM | POA: Diagnosis not present

## 2016-07-11 DIAGNOSIS — Z880 Allergy status to penicillin: Secondary | ICD-10-CM | POA: Diagnosis not present

## 2016-07-11 DIAGNOSIS — Z881 Allergy status to other antibiotic agents status: Secondary | ICD-10-CM | POA: Diagnosis not present

## 2016-07-11 DIAGNOSIS — R160 Hepatomegaly, not elsewhere classified: Secondary | ICD-10-CM | POA: Diagnosis not present

## 2016-07-11 DIAGNOSIS — Z888 Allergy status to other drugs, medicaments and biological substances status: Secondary | ICD-10-CM | POA: Diagnosis not present

## 2016-07-11 DIAGNOSIS — Z79899 Other long term (current) drug therapy: Secondary | ICD-10-CM | POA: Diagnosis not present

## 2016-07-11 DIAGNOSIS — K760 Fatty (change of) liver, not elsewhere classified: Secondary | ICD-10-CM | POA: Diagnosis not present

## 2016-07-31 DIAGNOSIS — E1165 Type 2 diabetes mellitus with hyperglycemia: Secondary | ICD-10-CM | POA: Diagnosis not present

## 2016-07-31 DIAGNOSIS — R809 Proteinuria, unspecified: Secondary | ICD-10-CM | POA: Diagnosis not present

## 2016-07-31 DIAGNOSIS — E1129 Type 2 diabetes mellitus with other diabetic kidney complication: Secondary | ICD-10-CM | POA: Diagnosis not present

## 2016-07-31 DIAGNOSIS — R3129 Other microscopic hematuria: Secondary | ICD-10-CM | POA: Diagnosis not present

## 2016-08-27 DIAGNOSIS — F411 Generalized anxiety disorder: Secondary | ICD-10-CM | POA: Diagnosis not present

## 2016-08-27 DIAGNOSIS — F332 Major depressive disorder, recurrent severe without psychotic features: Secondary | ICD-10-CM | POA: Diagnosis not present

## 2016-08-27 DIAGNOSIS — E669 Obesity, unspecified: Secondary | ICD-10-CM | POA: Diagnosis not present

## 2016-08-27 DIAGNOSIS — F4310 Post-traumatic stress disorder, unspecified: Secondary | ICD-10-CM | POA: Diagnosis not present

## 2016-08-28 DIAGNOSIS — R3129 Other microscopic hematuria: Secondary | ICD-10-CM | POA: Diagnosis not present

## 2016-08-28 DIAGNOSIS — R319 Hematuria, unspecified: Secondary | ICD-10-CM | POA: Diagnosis not present

## 2016-09-09 DIAGNOSIS — J069 Acute upper respiratory infection, unspecified: Secondary | ICD-10-CM | POA: Diagnosis not present

## 2016-09-09 DIAGNOSIS — B9789 Other viral agents as the cause of diseases classified elsewhere: Secondary | ICD-10-CM | POA: Diagnosis not present

## 2016-09-09 DIAGNOSIS — R6889 Other general symptoms and signs: Secondary | ICD-10-CM | POA: Diagnosis not present

## 2016-09-09 DIAGNOSIS — J111 Influenza due to unidentified influenza virus with other respiratory manifestations: Secondary | ICD-10-CM | POA: Diagnosis not present

## 2016-09-09 DIAGNOSIS — I10 Essential (primary) hypertension: Secondary | ICD-10-CM | POA: Diagnosis not present

## 2016-09-15 DIAGNOSIS — Z881 Allergy status to other antibiotic agents status: Secondary | ICD-10-CM | POA: Diagnosis not present

## 2016-09-15 DIAGNOSIS — Z882 Allergy status to sulfonamides status: Secondary | ICD-10-CM | POA: Diagnosis not present

## 2016-09-15 DIAGNOSIS — Z79899 Other long term (current) drug therapy: Secondary | ICD-10-CM | POA: Diagnosis not present

## 2016-09-15 DIAGNOSIS — Z880 Allergy status to penicillin: Secondary | ICD-10-CM | POA: Diagnosis not present

## 2016-09-15 DIAGNOSIS — F329 Major depressive disorder, single episode, unspecified: Secondary | ICD-10-CM | POA: Diagnosis not present

## 2016-09-15 DIAGNOSIS — S6991XA Unspecified injury of right wrist, hand and finger(s), initial encounter: Secondary | ICD-10-CM | POA: Diagnosis not present

## 2016-09-15 DIAGNOSIS — W228XXA Striking against or struck by other objects, initial encounter: Secondary | ICD-10-CM | POA: Diagnosis not present

## 2016-09-15 DIAGNOSIS — M79641 Pain in right hand: Secondary | ICD-10-CM | POA: Diagnosis not present

## 2016-09-15 DIAGNOSIS — F4310 Post-traumatic stress disorder, unspecified: Secondary | ICD-10-CM | POA: Diagnosis not present

## 2016-09-15 DIAGNOSIS — S60221A Contusion of right hand, initial encounter: Secondary | ICD-10-CM | POA: Diagnosis not present

## 2016-09-23 DIAGNOSIS — F4310 Post-traumatic stress disorder, unspecified: Secondary | ICD-10-CM | POA: Diagnosis not present

## 2016-09-23 DIAGNOSIS — F411 Generalized anxiety disorder: Secondary | ICD-10-CM | POA: Diagnosis not present

## 2016-09-23 DIAGNOSIS — F332 Major depressive disorder, recurrent severe without psychotic features: Secondary | ICD-10-CM | POA: Diagnosis not present

## 2016-09-23 DIAGNOSIS — F609 Personality disorder, unspecified: Secondary | ICD-10-CM | POA: Diagnosis not present

## 2016-10-11 DIAGNOSIS — E1165 Type 2 diabetes mellitus with hyperglycemia: Secondary | ICD-10-CM | POA: Diagnosis not present

## 2016-10-11 DIAGNOSIS — R809 Proteinuria, unspecified: Secondary | ICD-10-CM | POA: Diagnosis not present

## 2016-10-11 DIAGNOSIS — E1129 Type 2 diabetes mellitus with other diabetic kidney complication: Secondary | ICD-10-CM | POA: Diagnosis not present

## 2016-10-17 DIAGNOSIS — E1165 Type 2 diabetes mellitus with hyperglycemia: Secondary | ICD-10-CM | POA: Diagnosis not present

## 2016-10-17 DIAGNOSIS — E781 Pure hyperglyceridemia: Secondary | ICD-10-CM | POA: Diagnosis not present

## 2016-10-17 DIAGNOSIS — I10 Essential (primary) hypertension: Secondary | ICD-10-CM | POA: Diagnosis not present

## 2016-10-17 DIAGNOSIS — R809 Proteinuria, unspecified: Secondary | ICD-10-CM | POA: Diagnosis not present

## 2016-10-17 DIAGNOSIS — E784 Other hyperlipidemia: Secondary | ICD-10-CM | POA: Diagnosis not present

## 2016-10-17 DIAGNOSIS — E1129 Type 2 diabetes mellitus with other diabetic kidney complication: Secondary | ICD-10-CM | POA: Diagnosis not present

## 2016-10-22 DIAGNOSIS — J0140 Acute pansinusitis, unspecified: Secondary | ICD-10-CM | POA: Diagnosis not present

## 2016-10-22 DIAGNOSIS — R05 Cough: Secondary | ICD-10-CM | POA: Diagnosis not present

## 2016-10-22 DIAGNOSIS — R030 Elevated blood-pressure reading, without diagnosis of hypertension: Secondary | ICD-10-CM | POA: Diagnosis not present

## 2016-10-22 DIAGNOSIS — I10 Essential (primary) hypertension: Secondary | ICD-10-CM | POA: Diagnosis not present

## 2016-11-20 DIAGNOSIS — D225 Melanocytic nevi of trunk: Secondary | ICD-10-CM | POA: Diagnosis not present

## 2016-11-20 DIAGNOSIS — L814 Other melanin hyperpigmentation: Secondary | ICD-10-CM | POA: Diagnosis not present

## 2016-11-20 DIAGNOSIS — F411 Generalized anxiety disorder: Secondary | ICD-10-CM | POA: Diagnosis not present

## 2016-11-20 DIAGNOSIS — F3341 Major depressive disorder, recurrent, in partial remission: Secondary | ICD-10-CM | POA: Diagnosis not present

## 2016-11-20 DIAGNOSIS — F609 Personality disorder, unspecified: Secondary | ICD-10-CM | POA: Diagnosis not present

## 2016-11-20 DIAGNOSIS — L910 Hypertrophic scar: Secondary | ICD-10-CM | POA: Diagnosis not present

## 2016-11-20 DIAGNOSIS — D2372 Other benign neoplasm of skin of left lower limb, including hip: Secondary | ICD-10-CM | POA: Diagnosis not present

## 2016-11-22 DIAGNOSIS — E113293 Type 2 diabetes mellitus with mild nonproliferative diabetic retinopathy without macular edema, bilateral: Secondary | ICD-10-CM | POA: Diagnosis not present

## 2016-11-27 DIAGNOSIS — Z111 Encounter for screening for respiratory tuberculosis: Secondary | ICD-10-CM | POA: Diagnosis not present

## 2016-12-03 DIAGNOSIS — Z01419 Encounter for gynecological examination (general) (routine) without abnormal findings: Secondary | ICD-10-CM | POA: Diagnosis not present

## 2016-12-03 DIAGNOSIS — Z6841 Body Mass Index (BMI) 40.0 and over, adult: Secondary | ICD-10-CM | POA: Diagnosis not present

## 2016-12-04 DIAGNOSIS — J029 Acute pharyngitis, unspecified: Secondary | ICD-10-CM | POA: Diagnosis not present

## 2016-12-04 DIAGNOSIS — I10 Essential (primary) hypertension: Secondary | ICD-10-CM | POA: Diagnosis not present

## 2017-01-07 DIAGNOSIS — B349 Viral infection, unspecified: Secondary | ICD-10-CM | POA: Diagnosis not present

## 2017-01-07 DIAGNOSIS — I10 Essential (primary) hypertension: Secondary | ICD-10-CM | POA: Diagnosis not present

## 2017-01-07 DIAGNOSIS — R05 Cough: Secondary | ICD-10-CM | POA: Diagnosis not present

## 2017-01-09 DIAGNOSIS — Z0000 Encounter for general adult medical examination without abnormal findings: Secondary | ICD-10-CM | POA: Diagnosis not present

## 2017-01-09 DIAGNOSIS — E781 Pure hyperglyceridemia: Secondary | ICD-10-CM | POA: Diagnosis not present

## 2017-01-09 DIAGNOSIS — R1031 Right lower quadrant pain: Secondary | ICD-10-CM | POA: Diagnosis not present

## 2017-01-09 DIAGNOSIS — E1129 Type 2 diabetes mellitus with other diabetic kidney complication: Secondary | ICD-10-CM | POA: Diagnosis not present

## 2017-01-09 DIAGNOSIS — I10 Essential (primary) hypertension: Secondary | ICD-10-CM | POA: Diagnosis not present

## 2017-01-09 DIAGNOSIS — F331 Major depressive disorder, recurrent, moderate: Secondary | ICD-10-CM | POA: Diagnosis not present

## 2017-01-09 DIAGNOSIS — R109 Unspecified abdominal pain: Secondary | ICD-10-CM | POA: Diagnosis not present

## 2017-01-09 DIAGNOSIS — E1165 Type 2 diabetes mellitus with hyperglycemia: Secondary | ICD-10-CM | POA: Diagnosis not present

## 2017-01-17 DIAGNOSIS — H9201 Otalgia, right ear: Secondary | ICD-10-CM | POA: Diagnosis not present

## 2017-01-17 DIAGNOSIS — J029 Acute pharyngitis, unspecified: Secondary | ICD-10-CM | POA: Diagnosis not present

## 2017-01-17 DIAGNOSIS — H66001 Acute suppurative otitis media without spontaneous rupture of ear drum, right ear: Secondary | ICD-10-CM | POA: Diagnosis not present

## 2017-01-17 DIAGNOSIS — I10 Essential (primary) hypertension: Secondary | ICD-10-CM | POA: Diagnosis not present

## 2017-01-18 DIAGNOSIS — Z880 Allergy status to penicillin: Secondary | ICD-10-CM | POA: Diagnosis not present

## 2017-01-18 DIAGNOSIS — Z882 Allergy status to sulfonamides status: Secondary | ICD-10-CM | POA: Diagnosis not present

## 2017-01-18 DIAGNOSIS — Z975 Presence of (intrauterine) contraceptive device: Secondary | ICD-10-CM | POA: Diagnosis not present

## 2017-01-18 DIAGNOSIS — J029 Acute pharyngitis, unspecified: Secondary | ICD-10-CM | POA: Diagnosis not present

## 2017-01-18 DIAGNOSIS — K029 Dental caries, unspecified: Secondary | ICD-10-CM | POA: Diagnosis not present

## 2017-01-18 DIAGNOSIS — R061 Stridor: Secondary | ICD-10-CM | POA: Diagnosis not present

## 2017-01-18 DIAGNOSIS — F4310 Post-traumatic stress disorder, unspecified: Secondary | ICD-10-CM | POA: Diagnosis not present

## 2017-01-18 DIAGNOSIS — R509 Fever, unspecified: Secondary | ICD-10-CM | POA: Diagnosis not present

## 2017-01-18 DIAGNOSIS — R0689 Other abnormalities of breathing: Secondary | ICD-10-CM | POA: Diagnosis not present

## 2017-01-18 DIAGNOSIS — Z79899 Other long term (current) drug therapy: Secondary | ICD-10-CM | POA: Diagnosis not present

## 2017-01-18 DIAGNOSIS — F329 Major depressive disorder, single episode, unspecified: Secondary | ICD-10-CM | POA: Diagnosis not present

## 2017-01-18 DIAGNOSIS — Z7984 Long term (current) use of oral hypoglycemic drugs: Secondary | ICD-10-CM | POA: Diagnosis not present

## 2017-01-21 DIAGNOSIS — E1129 Type 2 diabetes mellitus with other diabetic kidney complication: Secondary | ICD-10-CM | POA: Diagnosis not present

## 2017-01-21 DIAGNOSIS — J301 Allergic rhinitis due to pollen: Secondary | ICD-10-CM | POA: Diagnosis not present

## 2017-01-21 DIAGNOSIS — J352 Hypertrophy of adenoids: Secondary | ICD-10-CM | POA: Diagnosis not present

## 2017-01-21 DIAGNOSIS — H6983 Other specified disorders of Eustachian tube, bilateral: Secondary | ICD-10-CM | POA: Diagnosis not present

## 2017-01-24 DIAGNOSIS — F411 Generalized anxiety disorder: Secondary | ICD-10-CM | POA: Diagnosis not present

## 2017-01-24 DIAGNOSIS — F3341 Major depressive disorder, recurrent, in partial remission: Secondary | ICD-10-CM | POA: Diagnosis not present

## 2017-01-24 DIAGNOSIS — I10 Essential (primary) hypertension: Secondary | ICD-10-CM | POA: Diagnosis not present

## 2017-01-24 DIAGNOSIS — F603 Borderline personality disorder: Secondary | ICD-10-CM | POA: Diagnosis not present

## 2017-01-24 DIAGNOSIS — E119 Type 2 diabetes mellitus without complications: Secondary | ICD-10-CM | POA: Diagnosis not present

## 2017-02-10 DIAGNOSIS — J3089 Other allergic rhinitis: Secondary | ICD-10-CM | POA: Diagnosis not present

## 2017-02-12 DIAGNOSIS — J3089 Other allergic rhinitis: Secondary | ICD-10-CM | POA: Diagnosis not present

## 2017-02-13 DIAGNOSIS — J301 Allergic rhinitis due to pollen: Secondary | ICD-10-CM | POA: Diagnosis not present

## 2017-02-13 DIAGNOSIS — H6523 Chronic serous otitis media, bilateral: Secondary | ICD-10-CM | POA: Diagnosis not present

## 2017-02-13 DIAGNOSIS — J352 Hypertrophy of adenoids: Secondary | ICD-10-CM | POA: Diagnosis not present

## 2017-02-13 DIAGNOSIS — I10 Essential (primary) hypertension: Secondary | ICD-10-CM | POA: Diagnosis not present

## 2017-02-17 DIAGNOSIS — W01198A Fall on same level from slipping, tripping and stumbling with subsequent striking against other object, initial encounter: Secondary | ICD-10-CM | POA: Diagnosis not present

## 2017-02-17 DIAGNOSIS — Z886 Allergy status to analgesic agent status: Secondary | ICD-10-CM | POA: Diagnosis not present

## 2017-02-17 DIAGNOSIS — Z79899 Other long term (current) drug therapy: Secondary | ICD-10-CM | POA: Diagnosis not present

## 2017-02-17 DIAGNOSIS — S56911A Strain of unspecified muscles, fascia and tendons at forearm level, right arm, initial encounter: Secondary | ICD-10-CM | POA: Diagnosis not present

## 2017-02-17 DIAGNOSIS — F4310 Post-traumatic stress disorder, unspecified: Secondary | ICD-10-CM | POA: Diagnosis not present

## 2017-02-17 DIAGNOSIS — Z882 Allergy status to sulfonamides status: Secondary | ICD-10-CM | POA: Diagnosis not present

## 2017-02-17 DIAGNOSIS — M79631 Pain in right forearm: Secondary | ICD-10-CM | POA: Diagnosis not present

## 2017-02-17 DIAGNOSIS — Z7902 Long term (current) use of antithrombotics/antiplatelets: Secondary | ICD-10-CM | POA: Diagnosis not present

## 2017-02-17 DIAGNOSIS — F329 Major depressive disorder, single episode, unspecified: Secondary | ICD-10-CM | POA: Diagnosis not present

## 2017-02-17 DIAGNOSIS — Z915 Personal history of self-harm: Secondary | ICD-10-CM | POA: Diagnosis not present

## 2017-02-17 DIAGNOSIS — Z888 Allergy status to other drugs, medicaments and biological substances status: Secondary | ICD-10-CM | POA: Diagnosis not present

## 2017-02-17 DIAGNOSIS — Z880 Allergy status to penicillin: Secondary | ICD-10-CM | POA: Diagnosis not present

## 2017-02-17 DIAGNOSIS — S59911A Unspecified injury of right forearm, initial encounter: Secondary | ICD-10-CM | POA: Diagnosis not present

## 2017-02-17 DIAGNOSIS — M79601 Pain in right arm: Secondary | ICD-10-CM | POA: Diagnosis not present

## 2017-02-20 DIAGNOSIS — J3089 Other allergic rhinitis: Secondary | ICD-10-CM | POA: Diagnosis not present

## 2017-02-26 DIAGNOSIS — J3089 Other allergic rhinitis: Secondary | ICD-10-CM | POA: Diagnosis not present

## 2017-03-03 DIAGNOSIS — E1165 Type 2 diabetes mellitus with hyperglycemia: Secondary | ICD-10-CM | POA: Diagnosis not present

## 2017-03-03 DIAGNOSIS — Z888 Allergy status to other drugs, medicaments and biological substances status: Secondary | ICD-10-CM | POA: Diagnosis not present

## 2017-03-03 DIAGNOSIS — I10 Essential (primary) hypertension: Secondary | ICD-10-CM | POA: Diagnosis not present

## 2017-03-03 DIAGNOSIS — J352 Hypertrophy of adenoids: Secondary | ICD-10-CM | POA: Diagnosis not present

## 2017-03-03 DIAGNOSIS — Z79899 Other long term (current) drug therapy: Secondary | ICD-10-CM | POA: Diagnosis not present

## 2017-03-03 DIAGNOSIS — Z880 Allergy status to penicillin: Secondary | ICD-10-CM | POA: Diagnosis not present

## 2017-03-03 DIAGNOSIS — Z7984 Long term (current) use of oral hypoglycemic drugs: Secondary | ICD-10-CM | POA: Diagnosis not present

## 2017-03-03 DIAGNOSIS — Z882 Allergy status to sulfonamides status: Secondary | ICD-10-CM | POA: Diagnosis not present

## 2017-03-03 DIAGNOSIS — E785 Hyperlipidemia, unspecified: Secondary | ICD-10-CM | POA: Diagnosis not present

## 2017-03-03 DIAGNOSIS — F4310 Post-traumatic stress disorder, unspecified: Secondary | ICD-10-CM | POA: Diagnosis not present

## 2017-03-03 DIAGNOSIS — H6523 Chronic serous otitis media, bilateral: Secondary | ICD-10-CM | POA: Diagnosis not present

## 2017-03-24 DIAGNOSIS — I10 Essential (primary) hypertension: Secondary | ICD-10-CM | POA: Diagnosis not present

## 2017-03-24 DIAGNOSIS — R6889 Other general symptoms and signs: Secondary | ICD-10-CM | POA: Diagnosis not present

## 2017-03-24 DIAGNOSIS — J069 Acute upper respiratory infection, unspecified: Secondary | ICD-10-CM | POA: Diagnosis not present

## 2017-04-08 DIAGNOSIS — Z3A00 Weeks of gestation of pregnancy not specified: Secondary | ICD-10-CM | POA: Diagnosis not present

## 2017-04-08 DIAGNOSIS — O3680X0 Pregnancy with inconclusive fetal viability, not applicable or unspecified: Secondary | ICD-10-CM | POA: Diagnosis not present

## 2017-04-08 DIAGNOSIS — Z3200 Encounter for pregnancy test, result unknown: Secondary | ICD-10-CM | POA: Diagnosis not present

## 2017-04-08 DIAGNOSIS — N912 Amenorrhea, unspecified: Secondary | ICD-10-CM | POA: Diagnosis not present

## 2017-04-23 DIAGNOSIS — I10 Essential (primary) hypertension: Secondary | ICD-10-CM | POA: Diagnosis not present

## 2017-04-23 DIAGNOSIS — J40 Bronchitis, not specified as acute or chronic: Secondary | ICD-10-CM | POA: Diagnosis not present

## 2017-04-23 DIAGNOSIS — R52 Pain, unspecified: Secondary | ICD-10-CM | POA: Diagnosis not present

## 2017-04-23 DIAGNOSIS — R05 Cough: Secondary | ICD-10-CM | POA: Diagnosis not present

## 2017-04-23 DIAGNOSIS — R6883 Chills (without fever): Secondary | ICD-10-CM | POA: Diagnosis not present

## 2017-05-21 DIAGNOSIS — I10 Essential (primary) hypertension: Secondary | ICD-10-CM | POA: Diagnosis not present

## 2017-05-21 DIAGNOSIS — Z113 Encounter for screening for infections with a predominantly sexual mode of transmission: Secondary | ICD-10-CM | POA: Diagnosis not present

## 2017-05-21 DIAGNOSIS — R1031 Right lower quadrant pain: Secondary | ICD-10-CM | POA: Diagnosis not present

## 2017-05-21 DIAGNOSIS — N898 Other specified noninflammatory disorders of vagina: Secondary | ICD-10-CM | POA: Diagnosis not present

## 2017-05-21 DIAGNOSIS — E1129 Type 2 diabetes mellitus with other diabetic kidney complication: Secondary | ICD-10-CM | POA: Diagnosis not present

## 2017-05-21 DIAGNOSIS — E1165 Type 2 diabetes mellitus with hyperglycemia: Secondary | ICD-10-CM | POA: Diagnosis not present

## 2017-05-26 DIAGNOSIS — R52 Pain, unspecified: Secondary | ICD-10-CM | POA: Diagnosis not present

## 2017-05-26 DIAGNOSIS — R05 Cough: Secondary | ICD-10-CM | POA: Diagnosis not present

## 2017-05-26 DIAGNOSIS — J101 Influenza due to other identified influenza virus with other respiratory manifestations: Secondary | ICD-10-CM | POA: Diagnosis not present

## 2017-05-26 DIAGNOSIS — I10 Essential (primary) hypertension: Secondary | ICD-10-CM | POA: Diagnosis not present

## 2017-05-28 DIAGNOSIS — E1165 Type 2 diabetes mellitus with hyperglycemia: Secondary | ICD-10-CM | POA: Diagnosis not present

## 2017-05-28 DIAGNOSIS — R05 Cough: Secondary | ICD-10-CM | POA: Diagnosis not present

## 2017-05-28 DIAGNOSIS — Z7984 Long term (current) use of oral hypoglycemic drugs: Secondary | ICD-10-CM | POA: Diagnosis not present

## 2017-05-28 DIAGNOSIS — J101 Influenza due to other identified influenza virus with other respiratory manifestations: Secondary | ICD-10-CM | POA: Diagnosis not present

## 2017-05-28 DIAGNOSIS — R000 Tachycardia, unspecified: Secondary | ICD-10-CM | POA: Diagnosis not present

## 2017-05-28 DIAGNOSIS — E1129 Type 2 diabetes mellitus with other diabetic kidney complication: Secondary | ICD-10-CM | POA: Diagnosis not present

## 2017-05-28 DIAGNOSIS — Z09 Encounter for follow-up examination after completed treatment for conditions other than malignant neoplasm: Secondary | ICD-10-CM | POA: Diagnosis not present

## 2017-05-28 DIAGNOSIS — R531 Weakness: Secondary | ICD-10-CM | POA: Diagnosis not present

## 2017-05-28 DIAGNOSIS — Z886 Allergy status to analgesic agent status: Secondary | ICD-10-CM | POA: Diagnosis not present

## 2017-05-28 DIAGNOSIS — Z79899 Other long term (current) drug therapy: Secondary | ICD-10-CM | POA: Diagnosis not present

## 2017-05-28 DIAGNOSIS — Z883 Allergy status to other anti-infective agents status: Secondary | ICD-10-CM | POA: Diagnosis not present

## 2017-05-28 DIAGNOSIS — J111 Influenza due to unidentified influenza virus with other respiratory manifestations: Secondary | ICD-10-CM | POA: Diagnosis not present

## 2017-05-28 DIAGNOSIS — Z882 Allergy status to sulfonamides status: Secondary | ICD-10-CM | POA: Diagnosis not present

## 2017-05-28 DIAGNOSIS — Z880 Allergy status to penicillin: Secondary | ICD-10-CM | POA: Diagnosis not present

## 2017-05-28 DIAGNOSIS — I10 Essential (primary) hypertension: Secondary | ICD-10-CM | POA: Diagnosis not present

## 2017-06-03 DIAGNOSIS — I10 Essential (primary) hypertension: Secondary | ICD-10-CM | POA: Diagnosis not present

## 2017-06-03 DIAGNOSIS — J40 Bronchitis, not specified as acute or chronic: Secondary | ICD-10-CM | POA: Diagnosis not present

## 2017-06-03 DIAGNOSIS — Z8709 Personal history of other diseases of the respiratory system: Secondary | ICD-10-CM | POA: Diagnosis not present

## 2017-06-03 DIAGNOSIS — R05 Cough: Secondary | ICD-10-CM | POA: Diagnosis not present

## 2017-06-12 DIAGNOSIS — B349 Viral infection, unspecified: Secondary | ICD-10-CM | POA: Diagnosis not present

## 2017-06-12 DIAGNOSIS — R110 Nausea: Secondary | ICD-10-CM | POA: Diagnosis not present

## 2017-06-12 DIAGNOSIS — R05 Cough: Secondary | ICD-10-CM | POA: Diagnosis not present

## 2017-06-12 DIAGNOSIS — R52 Pain, unspecified: Secondary | ICD-10-CM | POA: Diagnosis not present

## 2017-06-12 DIAGNOSIS — I10 Essential (primary) hypertension: Secondary | ICD-10-CM | POA: Diagnosis not present

## 2017-06-25 DIAGNOSIS — S0502XA Injury of conjunctiva and corneal abrasion without foreign body, left eye, initial encounter: Secondary | ICD-10-CM | POA: Diagnosis not present

## 2017-06-25 DIAGNOSIS — T1512XA Foreign body in conjunctival sac, left eye, initial encounter: Secondary | ICD-10-CM | POA: Diagnosis not present

## 2017-07-10 DIAGNOSIS — B349 Viral infection, unspecified: Secondary | ICD-10-CM | POA: Diagnosis not present

## 2017-07-10 DIAGNOSIS — J029 Acute pharyngitis, unspecified: Secondary | ICD-10-CM | POA: Diagnosis not present

## 2017-07-10 DIAGNOSIS — I10 Essential (primary) hypertension: Secondary | ICD-10-CM | POA: Diagnosis not present

## 2017-07-10 DIAGNOSIS — M791 Myalgia, unspecified site: Secondary | ICD-10-CM | POA: Diagnosis not present

## 2017-07-17 DIAGNOSIS — M7582 Other shoulder lesions, left shoulder: Secondary | ICD-10-CM | POA: Diagnosis not present

## 2017-08-13 DIAGNOSIS — E781 Pure hyperglyceridemia: Secondary | ICD-10-CM | POA: Diagnosis not present

## 2017-08-13 DIAGNOSIS — E7849 Other hyperlipidemia: Secondary | ICD-10-CM | POA: Diagnosis not present

## 2017-08-13 DIAGNOSIS — E1129 Type 2 diabetes mellitus with other diabetic kidney complication: Secondary | ICD-10-CM | POA: Diagnosis not present

## 2017-08-13 DIAGNOSIS — R809 Proteinuria, unspecified: Secondary | ICD-10-CM | POA: Diagnosis not present

## 2017-08-13 DIAGNOSIS — I10 Essential (primary) hypertension: Secondary | ICD-10-CM | POA: Diagnosis not present

## 2017-08-13 DIAGNOSIS — E1165 Type 2 diabetes mellitus with hyperglycemia: Secondary | ICD-10-CM | POA: Diagnosis not present

## 2017-09-02 DIAGNOSIS — H6982 Other specified disorders of Eustachian tube, left ear: Secondary | ICD-10-CM | POA: Diagnosis not present

## 2017-09-02 DIAGNOSIS — I10 Essential (primary) hypertension: Secondary | ICD-10-CM | POA: Diagnosis not present

## 2017-09-02 DIAGNOSIS — H6523 Chronic serous otitis media, bilateral: Secondary | ICD-10-CM | POA: Diagnosis not present

## 2017-09-02 DIAGNOSIS — J0120 Acute ethmoidal sinusitis, unspecified: Secondary | ICD-10-CM | POA: Diagnosis not present

## 2017-09-11 DIAGNOSIS — I10 Essential (primary) hypertension: Secondary | ICD-10-CM | POA: Diagnosis not present

## 2017-09-11 DIAGNOSIS — R6889 Other general symptoms and signs: Secondary | ICD-10-CM | POA: Diagnosis not present

## 2017-09-11 DIAGNOSIS — J019 Acute sinusitis, unspecified: Secondary | ICD-10-CM | POA: Diagnosis not present

## 2017-10-02 DIAGNOSIS — T8332XA Displacement of intrauterine contraceptive device, initial encounter: Secondary | ICD-10-CM | POA: Diagnosis not present

## 2017-10-03 DIAGNOSIS — Z30432 Encounter for removal of intrauterine contraceptive device: Secondary | ICD-10-CM | POA: Diagnosis not present

## 2017-10-03 DIAGNOSIS — T8332XA Displacement of intrauterine contraceptive device, initial encounter: Secondary | ICD-10-CM | POA: Diagnosis not present

## 2017-10-03 DIAGNOSIS — N83291 Other ovarian cyst, right side: Secondary | ICD-10-CM | POA: Diagnosis not present

## 2017-10-16 DIAGNOSIS — J09X2 Influenza due to identified novel influenza A virus with other respiratory manifestations: Secondary | ICD-10-CM | POA: Diagnosis not present

## 2017-10-16 DIAGNOSIS — Z20828 Contact with and (suspected) exposure to other viral communicable diseases: Secondary | ICD-10-CM | POA: Diagnosis not present

## 2017-10-19 DIAGNOSIS — J181 Lobar pneumonia, unspecified organism: Secondary | ICD-10-CM | POA: Diagnosis not present

## 2017-10-21 DIAGNOSIS — J189 Pneumonia, unspecified organism: Secondary | ICD-10-CM | POA: Diagnosis not present

## 2017-10-23 ENCOUNTER — Encounter: Payer: Self-pay | Admitting: Emergency Medicine

## 2017-10-23 ENCOUNTER — Emergency Department: Payer: BLUE CROSS/BLUE SHIELD

## 2017-10-23 ENCOUNTER — Emergency Department
Admission: EM | Admit: 2017-10-23 | Discharge: 2017-10-23 | Disposition: A | Discharge status: Home / Self Care | Payer: BLUE CROSS/BLUE SHIELD | Attending: Emergency Medicine | Admitting: Emergency Medicine

## 2017-10-23 DIAGNOSIS — J40 Bronchitis, not specified as acute or chronic: Secondary | ICD-10-CM | POA: Insufficient documentation

## 2017-10-23 DIAGNOSIS — E119 Type 2 diabetes mellitus without complications: Secondary | ICD-10-CM | POA: Diagnosis not present

## 2017-10-23 DIAGNOSIS — Z79899 Other long term (current) drug therapy: Secondary | ICD-10-CM | POA: Insufficient documentation

## 2017-10-23 DIAGNOSIS — J111 Influenza due to unidentified influenza virus with other respiratory manifestations: Secondary | ICD-10-CM | POA: Diagnosis not present

## 2017-10-23 DIAGNOSIS — R509 Fever, unspecified: Secondary | ICD-10-CM | POA: Diagnosis not present

## 2017-10-23 MED ORDER — FLUTICASONE PROPIONATE 50 MCG/ACT NA SUSP: 2.0000 | Freq: Every day | NASAL | 0 refills | Status: DC

## 2017-10-23 MED ORDER — BENZONATATE 100 MG PO CAPS: ORAL_CAPSULE | ORAL | 0 refills | Status: DC

## 2017-10-23 NOTE — ED Triage Notes (Signed)
Pt comes into the ED via POV c/o flu like symptoms where she is unable to control her fever at home.  Patient states she cant alternate between ibuprofen and tylenol due to being allergic to ibuprofen.  Patient tested positive for influenza A.  Patient has even and unlabored respirations at this time and in NAD.

## 2017-10-23 NOTE — ED Provider Notes (Signed)
[facility] Emergency Department Provider Note ____________________________________________  Time seen: 1445  I have reviewed the triage vital signs and the nursing notes.  HISTORY  Chief Complaint  Influenza  HPI ANAE HAMS is a 37 y.o. female sent to the ED accompanied by her fianc, for evaluation of continued flu symptoms.  The patient has reportedly tested positive for flu at least 3 times this season.  She rubs her initial diagnosis back in October.  She reports she dosed Tamiflu at that time and has been of her normal level of health and well-being.  About a week and a half earlier patient began to develop flulike symptoms.  She reported to a [facility] minute clinic, and tested positive again for influenza A.  She was placed again on Tamiflu, and apparently placed on a Z-Pak due to some clinical suspicion for pneumonia.  The patient completed the Z-Pak and return to the minute clinic today, citing increased fevers.  She reports a T-max of 100.77F today.  The patient has been dosing at thousand milligrams of ibuprofen about every 6 hours for her fevers.  She apparently is also been placed on an albuterol inhaler, and is currently on day 3 of 7 of a course of doxycycline.  Patient also has been using Vicks VapoRub for symptom relief.  She denies any chest pain, shortness of breath, vomiting, or diarrhea.  She reports an allergy to ibuprofen, causes facial swelling without anaphylaxis or angioedema.  Past Medical History:  Diagnosis Date  . Auditory hallucinations   . Diabetes mellitus without complication (HCC)    diet controlled    Patient Active Problem List   Diagnosis Date Noted  . MDD (major depressive disorder), recurrent severe, without psychosis (HCC) 05/03/2015  . Major depressive disorder, recurrent, severe without psychotic features (HCC)   . MDD (major depressive disorder), recurrent episode, severe (HCC) 04/11/2015  . PTSD (post-traumatic stress  disorder) 04/11/2015    Past Surgical History:  Procedure Laterality Date  . TONSILLECTOMY      Prior to Admission medications   Medication Sig Start Date End Date Taking? Authorizing Provider  ARIPiprazole (ABILIFY) 15 MG tablet Take 1 tablet (15 mg total) by mouth at bedtime. For mood control 05/10/15   Armandina Stammer I, NP  benzonatate (TESSALON PERLES) 100 MG capsule Take 1-2 tabs TID prn cough 10/23/17   Kaimani Clayson, Joan Ivory, PA-C  citalopram (CELEXA) 20 MG tablet Take 1 tablet (20 mg total) by mouth daily. For depression 05/10/15   Armandina Stammer I, NP  fluticasone (FLONASE) 50 MCG/ACT nasal spray Place 2 sprays into both nostrils daily. 10/23/17   Joffrey Kerce, Joan Ivory, PA-C  hydrOXYzine (ATARAX/VISTARIL) 25 MG tablet Take 1 tablet (25 mg total) by mouth every 6 (six) hours as needed for anxiety. 05/10/15   Armandina Stammer I, NP  prazosin (MINIPRESS) 1 MG capsule Take 3 capsules (3 mg total) by mouth at bedtime. For nightmares 05/10/15   Armandina Stammer I, NP  traZODone (DESYREL) 150 MG tablet Take 0.5 tablets (75 mg total) by mouth at bedtime as needed for sleep. 05/10/15   Armandina Stammer I, NP   Allergies Ciprofloxacin; Ibuprofen; Penicillins; and Sulfa antibiotics  No family history on file.  Social History Social History   Tobacco Use  . Smoking status: Never Smoker  Substance Use Topics  . Alcohol use: No  . Drug use: No    Review of Systems  Constitutional: Positive for fever. Eyes: Negative for visual changes. ENT: Negative  for sore throat. Cardiovascular: Negative for chest pain. Respiratory: Negative for shortness of breath. Gastrointestinal: Negative for abdominal pain, vomiting and diarrhea. Genitourinary: Negative for dysuria. Musculoskeletal: Negative for back pain. Skin: Negative for rash. Neurological: Negative for headaches, focal weakness or numbness. ____________________________________________  PHYSICAL EXAM:  VITAL SIGNS: ED Triage Vitals  Enc Vitals  Group     BP 10/23/17 1431 (!) 151/91     Pulse Rate 10/23/17 1431 98     Resp 10/23/17 1431 16     Temp 10/23/17 1431 98.4 F (36.9 C)     Temp src --      SpO2 10/23/17 1431 98 %     Weight 10/23/17 1431 260 lb (117.9 kg)     Height 10/23/17 1431 5\' 6"  (1.676 m)     Head Circumference --      Peak Flow --      Pain Score 10/23/17 1440 8     Pain Loc --      Pain Edu? --      Excl. in GC? --     Constitutional: Alert and oriented. Well appearing and in no distress. Head: Normocephalic and atraumatic. Eyes: Conjunctivae are normal. PERRL. Normal extraocular movements Ears: Canals clear. TMs intact bilaterally. Nose: No congestion/rhinorrhea/epistaxis. Mouth/Throat: Mucous membranes are moist. Neck: Supple. No thyromegaly. Hematological/Lymphatic/Immunological: No cervical lymphadenopathy. Cardiovascular: Normal rate, regular rhythm. Normal distal pulses.  No murmurs, rubs, or gallops. Respiratory: Normal respiratory effort. No wheezes/rales/rhonchi.  Lung fields are clear from apices to bases. Gastrointestinal: Soft and nontender. No distention. ___________________________________________   RADIOLOGY  CXR Negative ____________________________________________  INITIAL IMPRESSION / ASSESSMENT AND PLAN / ED COURSE  Your exam and chest x-ray are negative at this time for any acute infectious process.  Your symptoms likely represent a viral bronchitis.  You should continue with the medications as previously prescribed.  You should follow-up with your provider for ongoing symptom management.  Consider taking over-the-counter Delsym for cough relief.  A prescription for Tessalon Perles and Flonase have been provided for you.  Work note is been given for 2 days. ____________________________________________  FINAL CLINICAL IMPRESSION(S) / ED DIAGNOSES  Final diagnoses:  Influenza  Bronchitis      Joan [name], Joan IvoryJenise V Bacon, PA-C 10/23/17 1616    Dionne BucySiadecki, Sebastian,  MD 10/23/17 1753

## 2017-10-23 NOTE — Discharge Instructions (Addendum)
Your exam and x-ray support a diagnosis of influenza and bronchitis. Continue to dose the prescription meds as previously prescribed. Consider starting an OTC Delsym for cough relief. Drink fluids to prevent dehydration. Follow-up with your provider or return as needed.

## 2017-10-30 DIAGNOSIS — S59912A Unspecified injury of left forearm, initial encounter: Secondary | ICD-10-CM | POA: Diagnosis not present

## 2017-10-30 DIAGNOSIS — S4992XA Unspecified injury of left shoulder and upper arm, initial encounter: Secondary | ICD-10-CM | POA: Diagnosis not present

## 2017-10-30 DIAGNOSIS — S6992XA Unspecified injury of left wrist, hand and finger(s), initial encounter: Secondary | ICD-10-CM | POA: Diagnosis not present

## 2017-10-30 DIAGNOSIS — M25532 Pain in left wrist: Secondary | ICD-10-CM | POA: Diagnosis not present

## 2017-10-30 DIAGNOSIS — M79622 Pain in left upper arm: Secondary | ICD-10-CM | POA: Diagnosis not present

## 2017-10-30 DIAGNOSIS — M79632 Pain in left forearm: Secondary | ICD-10-CM | POA: Diagnosis not present

## 2017-10-30 DIAGNOSIS — M79602 Pain in left arm: Secondary | ICD-10-CM | POA: Diagnosis not present

## 2017-11-05 DIAGNOSIS — S4992XA Unspecified injury of left shoulder and upper arm, initial encounter: Secondary | ICD-10-CM | POA: Diagnosis not present

## 2017-11-05 DIAGNOSIS — S63502A Unspecified sprain of left wrist, initial encounter: Secondary | ICD-10-CM | POA: Diagnosis not present

## 2017-11-05 DIAGNOSIS — W07XXXA Fall from chair, initial encounter: Secondary | ICD-10-CM | POA: Diagnosis not present

## 2017-11-05 DIAGNOSIS — S5012XA Contusion of left forearm, initial encounter: Secondary | ICD-10-CM | POA: Diagnosis not present

## 2017-11-06 DIAGNOSIS — O200 Threatened abortion: Secondary | ICD-10-CM | POA: Diagnosis not present

## 2017-11-06 DIAGNOSIS — N912 Amenorrhea, unspecified: Secondary | ICD-10-CM | POA: Diagnosis not present

## 2017-11-06 DIAGNOSIS — R102 Pelvic and perineal pain: Secondary | ICD-10-CM | POA: Diagnosis not present

## 2017-11-08 ENCOUNTER — Other Ambulatory Visit: Payer: Self-pay | Admitting: Obstetrics and Gynecology

## 2017-11-08 DIAGNOSIS — O200 Threatened abortion: Secondary | ICD-10-CM

## 2017-11-14 DIAGNOSIS — O200 Threatened abortion: Secondary | ICD-10-CM | POA: Diagnosis not present

## 2017-11-19 DIAGNOSIS — F329 Major depressive disorder, single episode, unspecified: Secondary | ICD-10-CM | POA: Diagnosis not present

## 2017-11-20 DIAGNOSIS — F329 Major depressive disorder, single episode, unspecified: Secondary | ICD-10-CM | POA: Diagnosis not present

## 2017-11-24 ENCOUNTER — Encounter: Payer: Self-pay | Admitting: Primary Care

## 2017-11-24 ENCOUNTER — Ambulatory Visit: Payer: BLUE CROSS/BLUE SHIELD | Admitting: Primary Care

## 2017-11-24 ENCOUNTER — Encounter (INDEPENDENT_AMBULATORY_CARE_PROVIDER_SITE_OTHER): Payer: Self-pay

## 2017-11-24 VITALS — BP 140/92 | HR 100 | Temp 98.2°F | Ht 66.0 in | Wt 266.5 lb

## 2017-11-24 DIAGNOSIS — E1165 Type 2 diabetes mellitus with hyperglycemia: Secondary | ICD-10-CM | POA: Insufficient documentation

## 2017-11-24 DIAGNOSIS — F332 Major depressive disorder, recurrent severe without psychotic features: Secondary | ICD-10-CM

## 2017-11-24 DIAGNOSIS — I10 Essential (primary) hypertension: Secondary | ICD-10-CM | POA: Insufficient documentation

## 2017-11-24 DIAGNOSIS — F4310 Post-traumatic stress disorder, unspecified: Secondary | ICD-10-CM | POA: Diagnosis not present

## 2017-11-24 DIAGNOSIS — E119 Type 2 diabetes mellitus without complications: Secondary | ICD-10-CM

## 2017-11-24 DIAGNOSIS — IMO0002 Reserved for concepts with insufficient information to code with codable children: Secondary | ICD-10-CM | POA: Insufficient documentation

## 2017-11-24 DIAGNOSIS — E785 Hyperlipidemia, unspecified: Secondary | ICD-10-CM

## 2017-11-24 DIAGNOSIS — Z111 Encounter for screening for respiratory tuberculosis: Secondary | ICD-10-CM

## 2017-11-24 HISTORY — DX: Essential (primary) hypertension: I10

## 2017-11-24 LAB — LIPID PANEL
Cholesterol: 269 mg/dL — ABNORMAL HIGH (ref 0–200)
HDL: 62.9 mg/dL (ref >39.00)
NonHDL: 206.23
Total CHOL/HDL Ratio: 4
Triglycerides: 257 mg/dL — ABNORMAL HIGH (ref 0.0–149.0)
VLDL: 51.4 mg/dL — ABNORMAL HIGH (ref 0.0–40.0)

## 2017-11-24 LAB — COMPREHENSIVE METABOLIC PANEL
ALT: 23 U/L (ref 0–35)
AST: 19 U/L (ref 0–37)
Albumin: 4.1 g/dL (ref 3.5–5.2)
Alkaline Phosphatase: 96 U/L (ref 39–117)
BUN: 11 mg/dL (ref 6–23)
CO2: 25 mEq/L (ref 19–32)
Calcium: 9.4 mg/dL (ref 8.4–10.5)
Chloride: 103 mEq/L (ref 96–112)
Creatinine, Ser: 0.62 mg/dL (ref 0.40–1.20)
GFR: 114.36 mL/min (ref >60.00)
Glucose, Bld: 176 mg/dL — ABNORMAL HIGH (ref 70–99)
Potassium: 4.2 mEq/L (ref 3.5–5.1)
Sodium: 136 mEq/L (ref 135–145)
Total Bilirubin: 0.4 mg/dL (ref 0.2–1.2)
Total Protein: 7 g/dL (ref 6.0–8.3)

## 2017-11-24 LAB — LDL CHOLESTEROL, DIRECT: Direct LDL: 184 mg/dL

## 2017-11-24 LAB — HEMOGLOBIN A1C: Hgb A1c MFr Bld: 9.8 % — ABNORMAL HIGH (ref 4.6–6.5)

## 2017-11-24 MED ORDER — BLOOD GLUCOSE MONITOR KIT: PACK | 0 refills | Status: DC

## 2017-11-24 MED ORDER — LOSARTAN POTASSIUM 100 MG PO TABS: ORAL_TABLET | ORAL | 0 refills | Status: DC

## 2017-11-24 NOTE — Addendum Note (Signed)
Addended by: Tawnya CrookSAMBATH, Dontravious Camille on: 11/24/2017 03:01 PM   Modules accepted: Orders

## 2017-11-24 NOTE — Assessment & Plan Note (Signed)
Based off of records she's been on numerous antidepressants and antipsychotic medications in the past.   She appears stable and seems well kempt. 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    Referral placed to re-establish with psychiatry for medication maintenance. She denies SI/HI and recent cutting. No cutting marks noted on extremities.

## 2017-11-24 NOTE — Assessment & Plan Note (Signed)
Above goal in the office today, also with home readings. Increase Losartan to 100 mg once daily. Will have her monitor BP frequently and follow up in 3 weeks for BP check.   CMP pending.

## 2017-11-24 NOTE — Patient Instructions (Addendum)
We've increased your losartan to 100 mg. You may take two of the 50 mg tablets to equal 100 mg until your current bottle is empty.   Start monitoring your blood pressure daily, around the same time of day, for the next several weeks.  Ensure that you have rested for 30 minutes prior to checking your blood pressure. Record your readings and bring them to your next visit.  You will be contacted regarding your referral to psychiatry.  Please let us know if you have not been contacted within one week.   Start checking your blood sugars three times daily: before breakfast, 2 hours after lunch, bedtime.  Continue Metformin and Jardiance as prescribed.  Start exercising. You should be getting 150 minutes of moderate intensity exercise weekly.  It is important that you improve your diet. Please limit carbohydrates in the form of white bread, rice, pasta, sweets, fast food, fried food, sugary drinks, etc. Increase your consumption of fresh fruits and vegetables, whole grains, lean protein.  Ensure you are consuming 64 ounces of water daily.  Schedule a follow up visit in 3 weeks for blood pressure check.   It was a pleasure to meet you today! Please don't hesitate to call or message me with any questions. Welcome to [facility]!   Diabetes Mellitus and Nutrition When you have diabetes (diabetes mellitus), it is very important to have healthy eating habits because your blood sugar (glucose) levels are greatly affected by what you eat and drink. Eating healthy foods in the appropriate amounts, at about the same times every day, can help you:  Control your blood glucose.  Lower your risk of heart disease.  Improve your blood pressure.  Reach or maintain a healthy weight.  Every person with diabetes is different, and each person has different needs for a meal plan. Your health care provider may recommend that you work with a diet and nutrition specialist (dietitian) to make a meal plan that is best  for you. Your meal plan may vary depending on factors such as:  The calories you need.  The medicines you take.  Your weight.  Your blood glucose, blood pressure, and cholesterol levels.  Your activity level.  Other health conditions you have, such as heart or kidney disease.  How do carbohydrates affect me? Carbohydrates affect your blood glucose level more than any other type of food. Eating carbohydrates naturally increases the amount of glucose in your blood. Carbohydrate counting is a method for keeping track of how many carbohydrates you eat. Counting carbohydrates is important to keep your blood glucose at a healthy level, especially if you use insulin or take certain oral diabetes medicines. It is important to know how many carbohydrates you can safely have in each meal. This is different for every person. Your dietitian can help you calculate how many carbohydrates you should have at each meal and for snack. Foods that contain carbohydrates include:  Bread, cereal, rice, pasta, and crackers.  Potatoes and corn.  Peas, beans, and lentils.  Milk and yogurt.  Fruit and juice.  Desserts, such as cakes, cookies, ice cream, and candy.  How does alcohol affect me? Alcohol can cause a sudden decrease in blood glucose (hypoglycemia), especially if you use insulin or take certain oral diabetes medicines. Hypoglycemia can be a life-threatening condition. Symptoms of hypoglycemia (sleepiness, dizziness, and confusion) are similar to symptoms of having too much alcohol. If your health care provider says that alcohol is safe for you, follow these guidelines:  Limit  alcohol intake to no more than 1 drink per day for nonpregnant women and 2 drinks per day for men. One drink equals 12 oz of beer, 5 oz of wine, or 1 oz of hard liquor.  Do not drink on an empty stomach.  Keep yourself hydrated with water, diet soda, or unsweetened iced tea.  Keep in mind that regular soda, juice, and  other mixers may contain a lot of sugar and must be counted as carbohydrates.  What are tips for following this plan? Reading food labels  Start by checking the serving size on the label. The amount of calories, carbohydrates, fats, and other nutrients listed on the label are based on one serving of the food. Many foods contain more than one serving per package.  Check the total grams (g) of carbohydrates in one serving. You can calculate the number of servings of carbohydrates in one serving by dividing the total carbohydrates by 15. For example, if a food has 30 g of total carbohydrates, it would be equal to 2 servings of carbohydrates.  Check the number of grams (g) of saturated and trans fats in one serving. Choose foods that have low or no amount of these fats.  Check the number of milligrams (mg) of sodium in one serving. Most people should limit total sodium intake to less than 2,300 mg per day.  Always check the nutrition information of foods labeled as "low-fat" or "nonfat". These foods may be higher in added sugar or refined carbohydrates and should be avoided.  Talk to your dietitian to identify your daily goals for nutrients listed on the label. Shopping  Avoid buying canned, premade, or processed foods. These foods tend to be high in fat, sodium, and added sugar.  Shop around the outside edge of the grocery store. This includes fresh fruits and vegetables, bulk grains, fresh meats, and fresh dairy. Cooking  Use low-heat cooking methods, such as baking, instead of high-heat cooking methods like deep frying.  Cook using healthy oils, such as olive, canola, or sunflower oil.  Avoid cooking with butter, cream, or high-fat meats. Meal planning  Eat meals and snacks regularly, preferably at the same times every day. Avoid going long periods of time without eating.  Eat foods high in fiber, such as fresh fruits, vegetables, beans, and whole grains. Talk to your dietitian about  how many servings of carbohydrates you can eat at each meal.  Eat 4-6 ounces of lean protein each day, such as lean meat, chicken, fish, eggs, or tofu. 1 ounce is equal to 1 ounce of meat, chicken, or fish, 1 egg, or 1/4 cup of tofu.  Eat some foods each day that contain healthy fats, such as avocado, nuts, seeds, and fish. Lifestyle   Check your blood glucose regularly.  Exercise at least 30 minutes 5 or more days each week, or as told by your health care provider.  Take medicines as told by your health care provider.  Do not use any products that contain nicotine or tobacco, such as cigarettes and e-cigarettes. If you need help quitting, ask your health care provider.  Work with a Veterinary surgeoncounselor or diabetes educator to identify strategies to manage stress and any emotional and social challenges. What are some questions to ask my health care provider?  Do I need to meet with a diabetes educator?  Do I need to meet with a dietitian?  What number can I call if I have questions?  When are the best times to check  my blood glucose? Where to find more information:  American Diabetes Association: diabetes.org/food-and-fitness/food  Academy of Nutrition and Dietetics: https://www.vargas.com/  [employer] of Diabetes and Digestive and Kidney Diseases (NIH): FindJewelers.cz Summary  A healthy meal plan will help you control your blood glucose and maintain a healthy lifestyle.  Working with a diet and nutrition specialist (dietitian) can help you make a meal plan that is best for you.  Keep in mind that carbohydrates and alcohol have immediate effects on your blood glucose levels. It is important to count carbohydrates and to use alcohol carefully. This information is not intended to replace advice given to you by your health care provider. Make sure you discuss any  questions you have with your health care provider. Document Released: 04/18/2005 Document Revised: 08/26/2016 Document Reviewed: 08/26/2016 Elsevier Interactive Patient Education  [employer].

## 2017-11-24 NOTE — Assessment & Plan Note (Signed)
A1C pending today. Continue Metformin and Jardiance for now, consider adding Lantus HS if A1C above goal. Rx for glucometer and supplies printed. Discussed to start checking glucose TID.  Foot exam today. Pneumonia vaccination UTD. Managed on ARB. Lipid panel pending.  Follow up in 3 weeks with glucose logs.

## 2017-11-24 NOTE — Progress Notes (Signed)
Subjective:    Patient ID: Joan [name], female    DOB: 1981/05/08, 37 y.o.   MRN: [id number]  HPI  Ms. Joan [name] is a 37 year old female who presents today to establish care and discuss the problems mentioned below. Will obtain old records.  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 She is also needing a skin TB test.  1) Major Depressive Disorder, PTSD: Diagnosed 2-3 years ago. Currently managed on Latuda 40 mg. She went to the [facility] in [facility] Wednesday last and was prescribed Latuda. She's taken Latuda in the past and has found it to be more effective in conjunction with another medication. She does not wish to return to [facility]. She is wanting a referral to see a psychiatrist at [facility]. She was once following with psychiatry in [place] but has not followed up since last Summer 2018, off of meds since October 2018.  Her anxiety and depression symptoms have increased over the last several months as she's been through several family/friend deaths, a recent miscarriage. She had a bad episode of depression with tearfulness 1 week ago. She's feeling like she has no purpose, doesn't want to get out of bed, tearful during the day, feeling anxious. She has a history of cutting, no recent cutting and is using food to cope with feelings.  She was once managed on Trazodone (doesn't like), Paxil, Buspar, Latuda, Celexa, Abilify, Minipress. She was on a "mood stabilizer and antidepressant" last year, stopped taking around October 2018. Based off of Care Everywhere she was once on Cymbalta 60 mg, Lamictal 25 mg, and Rexulti 0.5 mg.   2) Type 2 Diabetes: Diagnosed 18 years ago. Currently managed on Jardiance 25 mg, metformin 1000 mg BID. Her last A1C check was over one year ago. She's never been managed on insulin in the past. She does not check her glucose levels at home but knows  they are very high as she's been eating a lot of sweets and junk food to cope with depression and anxiety symptoms.  Diet currently consists of:  Breakfast: [place] bacon, eggs, vegetables, cereal  Lunch: Sandwich, veggie straws Dinner: Meat, vegetable, starch Snacks: Cookies, cake, chocolate  Desserts: 3-4 times daily Beverages: Soda, sweet tea, some water   Exercise: She works out at [employer] for 1 hour 5-6 times daily.    3) Essential Hypertension: Currently managed on losartan 50 mg. She checks her BP at home infrequently and gets readings of 140's/90's. She denies headaches, chest pain, dizziness. She has a family history of hypertension and hyperlipidemia.   BP Readings from Last 3 Encounters:  11/24/17 (!) 140/92  10/23/17 (!) 151/91  06/12/15 140/80   4) Hyperlipidemia: Previously managed on medication, no recent use in the past as she once reduced her weight down to 180 pounds. History of diabetes.   Review of Systems  Constitutional: Positive for fatigue.  Eyes: Negative for visual disturbance.  Respiratory: Negative for shortness of breath.   Cardiovascular: Negative for chest pain.  Gastrointestinal: Negative for abdominal pain.  Genitourinary: Negative for menstrual problem.  Skin: Negative for color change.  Neurological: Negative for dizziness and headaches.  Hematological: Negative for adenopathy.  Psychiatric/Behavioral: Positive for sleep disturbance. Negative for suicidal ideas. The patient is nervous/anxious.        See HPI       Past Medical History:  Diagnosis Date  .  Auditory hallucinations   . Diabetes mellitus without complication (HCC)    diet controlled  . Essential hypertension   . MDD (major depressive disorder)   . Miscarriage   . PTSD (post-traumatic stress disorder)      Social History   Socioeconomic History  . Marital status: Single    Spouse name: Not on file  . Number of children: Not on file  . Years of education: Not on file    . Highest education level: Not on file  Occupational History  . Not on file  Social Needs  . Financial resource strain: Not on file  . Food insecurity:    Worry: Not on file    Inability: Not on file  . Transportation needs:    Medical: Not on file    Non-medical: Not on file  Tobacco Use  . Smoking status: Never Smoker  . Smokeless tobacco: Never Used  Substance and Sexual Activity  . Alcohol use: No  . Drug use: No  . Sexual activity: Not on file  Lifestyle  . Physical activity:    Days per week: Not on file    Minutes per session: Not on file  . Stress: Not on file  Relationships  . Social connections:    Talks on phone: Not on file    Gets together: Not on file    Attends religious service: Not on file    Active member of club or organization: Not on file    Attends meetings of clubs or organizations: Not on file    Relationship status: Not on file  . Intimate partner violence:    Fear of current or ex partner: Not on file    Emotionally abused: Not on file    Physically abused: Not on file    Forced sexual activity: Not on file  Other Topics Concern  . Not on file  Social History Narrative   Engaged.    Past Surgical History:  Procedure Laterality Date  . ADENOIDECTOMY    . CHOLECYSTECTOMY  2009  . TONSILLECTOMY      Family History  Problem Relation Age of Onset  . Asthma Mother   . Diabetes Mother   . Hyperlipidemia Mother   . Hypertension Mother   . Diabetes Father   . Hyperlipidemia Father   . Hypertension Father   . Diabetes Brother   . Depression Brother   . Alcohol abuse Brother   . Depression Maternal Grandmother   . Stroke Paternal Grandmother     Allergies  Allergen Reactions  . Ciprofloxacin Hives and Rash  . Ibuprofen Swelling    Facial  Other reaction(s): Other, Other (See Comments) Whole face swelling Facial  Facial    . Penicillins Hives and Rash    Has patient had a PCN reaction causing immediate rash,  facial/tongue/throat swelling, SOB or lightheadedness with hypotension: No Has patient had a PCN reaction causing severe rash involving mucus membranes or skin necrosis: No Has patient had a PCN reaction that required hospitalization: No Has patient had a PCN reaction occurring within the last 10 years: No If all of the above answers are "NO", then may proceed with Cephalosporin use.  Other reaction(s): Other (See Comments) Has patient had a PCN reaction causing immediate rash, facial/tongue/throat swelling, SOB or lightheadedness with hypotension: No Has patient had a PCN reaction causing severe rash involving mucus membranes or skin necrosis: No Has patient had a PCN reaction that required hospitalization: No Has patient had a PCN  reaction occurring within the last 10 years: No If all of the above answers are "NO", then may proceed with Cephalosporin use. Has patient had a PCN reaction causing immediate rash, facial/tongue/throat swelling, SOB or lightheadedness with hypotension: No Has patient had a PCN reaction causing severe rash involving mucus membranes or skin necrosis: No Has patient had a PCN reaction that required hospitalization: No Has patient had a PCN reaction occurring within the last 10 years: No If all of the above answers are "NO", then may proceed with Cephalosporin use. Has patient had a PCN reaction causing immediate rash, facial/tongue/throat swelling, SOB or lightheadedness with hypotension: No Has patient had a PCN reaction causing severe rash involving mucus membranes or skin necrosis: No Has patient had a PCN reaction that required hospitalization: No Has patient had a PCN reaction occurring within the last 10 years: No If all of the above answers are "NO", then may proceed with Cephalosporin use. Has patient had a PCN reaction causing immediate rash, facial/tongue/throat swelling, SOB or lightheadedness with hypotension: No Has patient had a PCN reaction causing  severe rash involving mucus membranes or skin necrosis: No Has patient had a PCN reaction that required hospitalization: No Has patient had a PCN reaction occurring within the last 10 years: No If all of the above answers are "NO", then may proceed with Cephalosporin use.   . Sulfa Antibiotics Hives and Rash    Current Outpatient Medications on File Prior to Visit  Medication Sig Dispense Refill  . albuterol (PROVENTIL HFA;VENTOLIN HFA) 108 (90 Base) MCG/ACT inhaler Inhale into the lungs.    . empagliflozin (JARDIANCE) 25 MG TABS tablet Take by mouth.    . fluticasone (FLONASE) 50 MCG/ACT nasal spray Place 2 sprays into both nostrils daily. 16 g 0  . lurasidone (LATUDA) 40 MG TABS tablet Take by mouth.    . metFORMIN (GLUCOPHAGE) 1000 MG tablet Take 1,000 mg by mouth 2 (two) times daily with a meal.      No current facility-administered medications on file prior to visit.     BP (!) 140/92   Pulse 100   Temp 98.2 F (36.8 C) (Oral)   Ht 5\' 6"  (1.676 m)   Wt 266 lb 8 oz (120.9 kg)   LMP 11/16/2017   SpO2 97%   BMI 43.01 kg/m    Objective:   Physical Exam  Constitutional: She is oriented to person, place, and time. She appears well-nourished.  Neck: Neck supple.  Cardiovascular: Normal rate and regular rhythm.  Pulmonary/Chest: Effort normal and breath sounds normal.  Musculoskeletal: Normal range of motion.  Neurological: She is alert and oriented to person, place, and time.  Skin: Skin is warm and dry.  Psychiatric: She has a normal mood and affect.          Assessment & Plan:

## 2017-11-24 NOTE — Assessment & Plan Note (Signed)
Diagnosed in the past, once on medication, no recent use. Given history of diabetes and obesity she is at risk for heart disease. Lipid panel pending.

## 2017-11-25 ENCOUNTER — Other Ambulatory Visit: Payer: Self-pay | Admitting: Primary Care

## 2017-11-25 DIAGNOSIS — E119 Type 2 diabetes mellitus without complications: Secondary | ICD-10-CM

## 2017-11-25 MED ORDER — INSULIN GLARGINE 100 UNIT/ML SOLOSTAR PEN: 10.0000 [IU] | PEN_INJECTOR | Freq: Every evening | SUBCUTANEOUS | 5 refills | Status: DC

## 2017-11-25 MED ORDER — PEN NEEDLES 31G X 6 MM MISC: 2 refills | Status: DC

## 2017-11-26 ENCOUNTER — Other Ambulatory Visit: Payer: Self-pay | Admitting: Primary Care

## 2017-11-26 DIAGNOSIS — E782 Mixed hyperlipidemia: Secondary | ICD-10-CM

## 2017-11-26 LAB — TB SKIN TEST
Induration: 0 mm
TB Skin Test: NEGATIVE

## 2017-11-26 MED ORDER — ATORVASTATIN CALCIUM 40 MG PO TABS: ORAL_TABLET | ORAL | 1 refills | Status: DC

## 2017-11-27 ENCOUNTER — Telehealth: Payer: Self-pay

## 2017-11-27 NOTE — Telephone Encounter (Signed)
Referral was sent directly to [facility] by Mayra ReelKate Clark. Called patient and gave her their phone number and also Dr Lurlean LeydenKapurs phone number to call to get an appointment. Patient will call Shirlee LimerickMarion back if she has any difficulty making her appointment.

## 2017-11-27 NOTE — Telephone Encounter (Signed)
Copied from CRM [phone]. Topic: Referral - Status >> Nov 27, 2017 11:02 AM Windy KalataMichael, Taylor L, NT wrote: Reason for CRM: patient states she was supposed to have a referral for a psychiatrist. I do not see a referral in here and she states nobody has called her. She is requesting one as soon as possible. Patient would also like a call back in regards to the status of this.

## 2017-11-28 DIAGNOSIS — F329 Major depressive disorder, single episode, unspecified: Secondary | ICD-10-CM | POA: Diagnosis not present

## 2017-12-03 ENCOUNTER — Ambulatory Visit: Payer: Self-pay | Admitting: Primary Care

## 2017-12-15 ENCOUNTER — Encounter (INDEPENDENT_AMBULATORY_CARE_PROVIDER_SITE_OTHER): Payer: Self-pay

## 2017-12-15 ENCOUNTER — Encounter: Payer: Self-pay | Admitting: Family Medicine

## 2017-12-15 ENCOUNTER — Ambulatory Visit: Payer: BLUE CROSS/BLUE SHIELD | Admitting: Family Medicine

## 2017-12-15 VITALS — BP 118/74 | HR 100 | Temp 98.3°F

## 2017-12-15 DIAGNOSIS — J029 Acute pharyngitis, unspecified: Secondary | ICD-10-CM

## 2017-12-15 DIAGNOSIS — J302 Other seasonal allergic rhinitis: Secondary | ICD-10-CM | POA: Diagnosis not present

## 2017-12-15 LAB — POCT RAPID STREP A (OFFICE): Rapid Strep A Screen: NEGATIVE

## 2017-12-15 MED ORDER — ALBUTEROL SULFATE HFA 108 (90 BASE) MCG/ACT IN AERS: 2.0000 | INHALATION_SPRAY | Freq: Four times a day (QID) | RESPIRATORY_TRACT | 1 refills | Status: DC | PRN

## 2017-12-15 NOTE — Patient Instructions (Addendum)
Your rapid strep test is negative, I am sending a throat culture.   I think you have a viral illness, for your fever/chills/throat pain you can use Tylenol 2 tablets every 8 hours, throat spray, warm salt water gargles  Get extra rest and drink enough liquids to make your urine light yellow  If not better in 5-7 days or if worse, please follow up

## 2017-12-15 NOTE — Progress Notes (Signed)
   Subjective:    Patient ID: Joan [name], female    DOB: 05-02-1981, 37 y.o.   MRN: [id number]  HPI This is a 37 yo female who presents today with sore throat x 3 days. Subjective fever. Ear pain, headache behind eyes, no nasal drainage or congestion, dry cough. Feels a little SOB, no wheeze. Has used inhaler rarely. No vomiting, some nausea. 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 History of tonsillectomy, has had strep since tonsils were removed.    Past Medical History:  Diagnosis Date  . Auditory hallucinations   . Diabetes mellitus without complication (HCC)    diet controlled  . Essential hypertension   . Essential hypertension 11/24/2017  . MDD (major depressive disorder)   . Miscarriage   . PTSD (post-traumatic stress disorder)    Past Surgical History:  Procedure Laterality Date  . ADENOIDECTOMY    . CHOLECYSTECTOMY  2009  . TONSILLECTOMY     Family History  Problem Relation Age of Onset  . Asthma Mother   . Diabetes Mother   . Hyperlipidemia Mother   . Hypertension Mother   . Diabetes Father   . Hyperlipidemia Father   . Hypertension Father   . Diabetes Brother   . Depression Brother   . Alcohol abuse Brother   . Depression Maternal Grandmother   . Stroke Paternal Grandmother    Social History   Tobacco Use  . Smoking status: Never Smoker  . Smokeless tobacco: Never Used  Substance Use Topics  . Alcohol use: No  . Drug use: No      Review of Systems Per HPI    Objective:   Physical Exam  Constitutional: She is oriented to person, place, and time. She appears well-developed and well-nourished. She appears ill. No distress.  Sounds hoarse.   HENT:  Head: Normocephalic and atraumatic.  Right Ear: Tympanic membrane and ear canal normal.  Left Ear: Tympanic membrane and ear canal normal.  Mouth/Throat: Uvula is midline and mucous membranes are normal. No uvula swelling. Posterior oropharyngeal erythema present. No  oropharyngeal exudate or posterior oropharyngeal edema.  Neck: Normal range of motion.  Cardiovascular: Normal rate, regular rhythm and normal heart sounds.  Pulmonary/Chest: Effort normal and breath sounds normal.  Lymphadenopathy:    She has no cervical adenopathy.  Neurological: She is alert and oriented to person, place, and time.  Skin: Skin is warm and dry.  Psychiatric: She has a normal mood and affect. Her behavior is normal.  Vitals reviewed.     BP 118/74 (BP Location: Right Arm, Patient Position: Sitting, Cuff Size: Large)   Pulse 100   Temp 98.3 F (36.8 C) (Oral)   LMP 11/16/2017      Assessment & Plan:  1. Pharyngitis, unspecified etiology - likely viral, will send throat culture since she has positive sick contacts and history of strep s/p tonsillectomy - Provided written and verbal information regarding diagnosis and treatment. - symptomatic relief measures reviewed - Culture, Group A Strep - POCT rapid strep A  2. Seasonal allergic rhinitis, unspecified trigger - albuterol (PROVENTIL HFA;VENTOLIN HFA) 108 (90 Base) MCG/ACT inhaler; Inhale 2 puffs into the lungs every 6 (six) hours as needed for wheezing or shortness of breath.  Dispense: 1 Inhaler; Refill: 1   Olean Ree, FNP-BC  [facility], [facility]  12/15/2017 8:36 AM

## 2017-12-17 LAB — CULTURE, GROUP A STREP
MICRO NUMBER:: 90579996
SPECIMEN QUALITY:: ADEQUATE

## 2017-12-18 ENCOUNTER — Ambulatory Visit: Payer: BLUE CROSS/BLUE SHIELD | Admitting: Primary Care

## 2017-12-18 ENCOUNTER — Encounter: Payer: Self-pay | Admitting: Primary Care

## 2017-12-18 VITALS — BP 116/78 | HR 83 | Temp 98.2°F | Ht 66.0 in | Wt 271.5 lb

## 2017-12-18 DIAGNOSIS — E785 Hyperlipidemia, unspecified: Secondary | ICD-10-CM

## 2017-12-18 DIAGNOSIS — T7840XD Allergy, unspecified, subsequent encounter: Secondary | ICD-10-CM | POA: Diagnosis not present

## 2017-12-18 DIAGNOSIS — Z0182 Encounter for allergy testing: Secondary | ICD-10-CM | POA: Diagnosis not present

## 2017-12-18 DIAGNOSIS — E119 Type 2 diabetes mellitus without complications: Secondary | ICD-10-CM

## 2017-12-18 DIAGNOSIS — I10 Essential (primary) hypertension: Secondary | ICD-10-CM | POA: Diagnosis not present

## 2017-12-18 MED ORDER — LOSARTAN POTASSIUM 100 MG PO TABS: ORAL_TABLET | ORAL | 3 refills | Status: DC

## 2017-12-18 MED ORDER — METFORMIN HCL 1000 MG PO TABS: 1000.0000 mg | ORAL_TABLET | Freq: Two times a day (BID) | ORAL | 3 refills | Status: DC

## 2017-12-18 MED ORDER — INSULIN GLARGINE 100 UNIT/ML SOLOSTAR PEN: 14.0000 [IU] | PEN_INJECTOR | Freq: Every evening | SUBCUTANEOUS | 5 refills | Status: DC

## 2017-12-18 MED ORDER — EPINEPHRINE 0.3 MG/0.3ML IJ SOAJ: 0.3000 mg | Freq: Once | INTRAMUSCULAR | 0 refills | Status: AC

## 2017-12-18 MED ORDER — EMPAGLIFLOZIN 25 MG PO TABS: 25.0000 mg | ORAL_TABLET | Freq: Every day | ORAL | 3 refills | Status: DC

## 2017-12-18 NOTE — Assessment & Plan Note (Signed)
Improved on increased dose of Losartan 100 mg, continue same. BMP from last visit stable.

## 2017-12-18 NOTE — Patient Instructions (Signed)
Continue losartan 100 mg for blood pressure.  Continue Jardiance and Metformin for diabetes.  We've increased your Lantus to 14 units. Please notify me if you sugars run below 80 or at/above 200.  Schedule a follow up visit on or after July 22nd for diabetes and cholesterol checks.  It was a pleasure to see you today!

## 2017-12-18 NOTE — Assessment & Plan Note (Signed)
Initiated on statin last visit, repeat lipids next visit.

## 2017-12-18 NOTE — Progress Notes (Signed)
Subjective:    Patient ID: Joan [name], female    DOB: [birth year], 37 y.o.   MRN: [id number]  HPI  [name] is a 37 year old female who presents today for follow up of hypertension and diabetes.  1) Essential Hypertension: She was last evaluated on 11/24/17 with an elevated blood pressure reading in the office and also several at home. Her Losartan was increased to 100 mg and she was asked to follow up several weeks later.  BP Readings from Last 3 Encounters:  12/18/17 116/78  12/15/17 118/74  11/24/17 (!) 140/92   She denies chest pain, dizziness, shortness of breath.   2) Type 2 Diabetes:  Current medications include: Jardiance 25 mg, metformin 1000 mg BID, Lantus 10 units HS.  She is checking her blood glucose 1-3 times daily and is getting readings of: AM Fasting: 170's-180's Bedtime: 170's- 200's  Last A1C: 9.8 on 11/24/17 Last Eye Exam: Due Last Foot Exam: Due in April 2020 Pneumonia Vaccination: Completed in 2017 ACE/ARB: Losartan Statin: Lipitor  3) Food/Environmental Allergies: She's requesting allergy testing for food and environmental triggers. She'll notice mild lip tingling and throat swelling with Almonds, can drink Almond milk. This has been present for the past one year. She was tested by ENT one year ago and was found to be allergic to cats, dust mites, certain trees, etc. She does not have an epi pen. She's been on Losartan for 3-4 years.   Review of Systems  Eyes: Negative for visual disturbance.  Respiratory: Negative for shortness of breath.   Cardiovascular: Negative for chest pain.  Allergic/Immunologic: Positive for environmental allergies and food allergies.  Neurological: Negative for dizziness, numbness and headaches.       Past Medical History:  Diagnosis Date  . Auditory hallucinations   . Diabetes mellitus without complication (HCC)    diet controlled  . Essential hypertension   . Essential hypertension 11/24/2017  . MDD (major  depressive disorder)   . Miscarriage   . PTSD (post-traumatic stress disorder)      Social History   Socioeconomic History  . Marital status: Single    Spouse name: Not on file  . Number of children: Not on file  . Years of education: Not on file  . Highest education level: Not on file  Occupational History  . Not on file  Social Needs  . Financial resource strain: Not on file  . Food insecurity:    Worry: Not on file    Inability: Not on file  . Transportation needs:    Medical: Not on file    Non-medical: Not on file  Tobacco Use  . Smoking status: Never Smoker  . Smokeless tobacco: Never Used  Substance and Sexual Activity  . Alcohol use: No  . Drug use: No  . Sexual activity: Not on file  Lifestyle  . Physical activity:    Days per week: Not on file    Minutes per session: Not on file  . Stress: Not on file  Relationships  . Social connections:    Talks on phone: Not on file    Gets together: Not on file    Attends religious service: Not on file    Active member of club or organization: Not on file    Attends meetings of clubs or organizations: Not on file    Relationship status: Not on file  . Intimate partner violence:    Fear of current or ex partner: Not on file  Emotionally abused: Not on file    Physically abused: Not on file    Forced sexual activity: Not on file  Other Topics Concern  . Not on file  Social History Narrative   Engaged.    Past Surgical History:  Procedure Laterality Date  . ADENOIDECTOMY    . CHOLECYSTECTOMY  2009  . TONSILLECTOMY      Family History  Problem Relation Age of Onset  . Asthma Mother   . Diabetes Mother   . Hyperlipidemia Mother   . Hypertension Mother   . Diabetes Father   . Hyperlipidemia Father   . Hypertension Father   . Diabetes Brother   . Depression Brother   . Alcohol abuse Brother   . Depression Maternal Grandmother   . Stroke Paternal Grandmother     Allergies  Allergen Reactions  .  Ciprofloxacin Hives and Rash  . Ibuprofen Swelling    Facial  Other reaction(s): Other, Other (See Comments) Whole face swelling Facial  Facial    . Penicillins Hives and Rash    Has patient had a PCN reaction causing immediate rash, facial/tongue/throat swelling, SOB or lightheadedness with hypotension: No Has patient had a PCN reaction causing severe rash involving mucus membranes or skin necrosis: No Has patient had a PCN reaction that required hospitalization: No Has patient had a PCN reaction occurring within the last 10 years: No If all of the above answers are "NO", then may proceed with Cephalosporin use.  Other reaction(s): Other (See Comments) Has patient had a PCN reaction causing immediate rash, facial/tongue/throat swelling, SOB or lightheadedness with hypotension: No Has patient had a PCN reaction causing severe rash involving mucus membranes or skin necrosis: No Has patient had a PCN reaction that required hospitalization: No Has patient had a PCN reaction occurring within the last 10 years: No If all of the above answers are "NO", then may proceed with Cephalosporin use. Has patient had a PCN reaction causing immediate rash, facial/tongue/throat swelling, SOB or lightheadedness with hypotension: No Has patient had a PCN reaction causing severe rash involving mucus membranes or skin necrosis: No Has patient had a PCN reaction that required hospitalization: No Has patient had a PCN reaction occurring within the last 10 years: No If all of the above answers are "NO", then may proceed with Cephalosporin use. Has patient had a PCN reaction causing immediate rash, facial/tongue/throat swelling, SOB or lightheadedness with hypotension: No Has patient had a PCN reaction causing severe rash involving mucus membranes or skin necrosis: No Has patient had a PCN reaction that required hospitalization: No Has patient had a PCN reaction occurring within the last 10 years: No If all of  the above answers are "NO", then may proceed with Cephalosporin use. Has patient had a PCN reaction causing immediate rash, facial/tongue/throat swelling, SOB or lightheadedness with hypotension: No Has patient had a PCN reaction causing severe rash involving mucus membranes or skin necrosis: No Has patient had a PCN reaction that required hospitalization: No Has patient had a PCN reaction occurring within the last 10 years: No If all of the above answers are "NO", then may proceed with Cephalosporin use.   . Sulfa Antibiotics Hives and Rash    Current Outpatient Medications on File Prior to Visit  Medication Sig Dispense Refill  . albuterol (PROVENTIL HFA;VENTOLIN HFA) 108 (90 Base) MCG/ACT inhaler Inhale 2 puffs into the lungs every 6 (six) hours as needed for wheezing or shortness of breath. 1 Inhaler 1  . atorvastatin (LIPITOR)  40 MG tablet Take 1 tablet by mouth at bedtime for cholesterol. 90 tablet 1  . blood glucose meter kit and supplies KIT Dispense based on patient and insurance preference. Use up three times daily as directed. (FOR ICD-9 250.00, 250.01). 1 each 0  . citalopram (CELEXA) 10 MG tablet   1  . fluticasone (FLONASE) 50 MCG/ACT nasal spray Place 2 sprays into both nostrils daily. 16 g 0  . hydrOXYzine (ATARAX/VISTARIL) 50 MG tablet   0  . Insulin Pen Needle (PEN NEEDLES) 31G X 6 MM MISC Use with insulin as directed. 100 each 2  . lurasidone (LATUDA) 40 MG TABS tablet Take by mouth.     No current facility-administered medications on file prior to visit.     BP 116/78   Pulse 83   Temp 98.2 F (36.8 C) (Oral)   Ht '5\' 6"'  (1.676 m)   Wt 271 lb 8 oz (123.2 kg)   LMP 11/30/2017   SpO2 98%   BMI 43.82 kg/m    Objective:   Physical Exam  Constitutional: She appears well-nourished.  Neck: Neck supple.  Cardiovascular: Normal rate and regular rhythm.  Pulmonary/Chest: Effort normal and breath sounds normal.  Skin: Skin is warm and dry.          Assessment  & Plan:  Allergy:  Questionable food vs environmental allergies. Also managed on ARB for years, don't suspect it to be contributing to symptoms. Will send her for allergy testing. Rx for Epi Pen sent to pharmacy.  Pleas Koch, NP

## 2017-12-18 NOTE — Assessment & Plan Note (Signed)
Improved on Lantus 10 units, not at goal. Increase Lantus to 14 units HS given readings of 170-200's. Continue Jardiance and Metformin.   Discussed to schedule an eye exam. Foot exam UTD. Managed on statin and ARB.  Repeat A1c In 2 months. Discussed to call if glucose readings fall below 80 or remain at or above 200.

## 2017-12-19 ENCOUNTER — Telehealth: Payer: Self-pay

## 2017-12-19 NOTE — Telephone Encounter (Signed)
Left message for patient to call Juri Dinning back in regards to a referral-Lysle Yero V Tinzlee Craker, RMA   

## 2018-01-02 DIAGNOSIS — F329 Major depressive disorder, single episode, unspecified: Secondary | ICD-10-CM | POA: Diagnosis not present

## 2018-01-12 ENCOUNTER — Ambulatory Visit: Payer: BLUE CROSS/BLUE SHIELD | Admitting: Primary Care

## 2018-01-12 ENCOUNTER — Encounter: Payer: Self-pay | Admitting: Primary Care

## 2018-01-12 VITALS — BP 116/76 | HR 94 | Temp 98.2°F | Ht 66.0 in | Wt 270.0 lb

## 2018-01-12 DIAGNOSIS — R509 Fever, unspecified: Secondary | ICD-10-CM

## 2018-01-12 LAB — POC INFLUENZA A&B (BINAX/QUICKVUE)
Influenza A, POC: NEGATIVE
Influenza B, POC: NEGATIVE

## 2018-01-12 NOTE — Addendum Note (Signed)
Addended by: Tawnya CrookSAMBATH, Suhaan Perleberg on: 01/12/2018 12:19 PM   Modules accepted: Orders

## 2018-01-12 NOTE — Patient Instructions (Addendum)
Continue Tylenol for fevers, chills, body aches.  You can try Delsym DM or Robitussin DM as needed for any cough/congestion.  Nasal Congestion/Ear Pressure: Try using Flonase (fluticasone) nasal spray. Instill 1 spray in each nostril twice daily.   Make sure to stay hydrated with plenty of water.  It was a pleasure to see you today!

## 2018-01-12 NOTE — Progress Notes (Signed)
Subjective:    Patient ID: Joan [name], female    DOB: 09-19-1980, 37 y.o.   MRN: [id number]  HPI  Joan [name] is a 37 year old female who presents today with a chief complaint of fever.  She also reports body aches, chills, bilateral ear pain. sweats. Her fever was 101 yesterday, Her symptoms began suddenly yesterday. Her fevers have been running 99-100 since yesterday. She's been taking Tylenol with reduction in fevers. She works in a daycare center and thinks one of her children may have been sick. She denies cough.   Review of Systems  Constitutional: Positive for chills, diaphoresis, fatigue and fever.  HENT: Positive for ear pain. Negative for congestion, sinus pressure and sore throat.   Respiratory: Negative for cough and wheezing.        Past Medical History:  Diagnosis Date  . Auditory hallucinations   . Diabetes mellitus without complication (HCC)    diet controlled  . Essential hypertension   . Essential hypertension 11/24/2017  . MDD (major depressive disorder)   . Miscarriage   . PTSD (post-traumatic stress disorder)      Social History   Socioeconomic History  . Marital status: Single    Spouse name: Not on file  . Number of children: Not on file  . Years of education: Not on file  . Highest education level: Not on file  Occupational History  . Not on file  Social Needs  . Financial resource strain: Not on file  . Food insecurity:    Worry: Not on file    Inability: Not on file  . Transportation needs:    Medical: Not on file    Non-medical: Not on file  Tobacco Use  . Smoking status: Never Smoker  . Smokeless tobacco: Never Used  Substance and Sexual Activity  . Alcohol use: No  . Drug use: No  . Sexual activity: Not on file  Lifestyle  . Physical activity:    Days per week: Not on file    Minutes per session: Not on file  . Stress: Not on file  Relationships  . Social connections:    Talks on phone: Not on file    Gets together:  Not on file    Attends religious service: Not on file    Active member of club or organization: Not on file    Attends meetings of clubs or organizations: Not on file    Relationship status: Not on file  . Intimate partner violence:    Fear of current or ex partner: Not on file    Emotionally abused: Not on file    Physically abused: Not on file    Forced sexual activity: Not on file  Other Topics Concern  . Not on file  Social History Narrative   Engaged.    Past Surgical History:  Procedure Laterality Date  . ADENOIDECTOMY    . CHOLECYSTECTOMY  2009  . TONSILLECTOMY      Family History  Problem Relation Age of Onset  . Asthma Mother   . Diabetes Mother   . Hyperlipidemia Mother   . Hypertension Mother   . Diabetes Father   . Hyperlipidemia Father   . Hypertension Father   . Diabetes Brother   . Depression Brother   . Alcohol abuse Brother   . Depression Maternal Grandmother   . Stroke Paternal Grandmother     Allergies  Allergen Reactions  . Ciprofloxacin Hives and Rash  . Ibuprofen Swelling  Facial  Other reaction(s): Other, Other (See Comments) Whole face swelling Facial  Facial    . Penicillins Hives and Rash    Has patient had a PCN reaction causing immediate rash, facial/tongue/throat swelling, SOB or lightheadedness with hypotension: No Has patient had a PCN reaction causing severe rash involving mucus membranes or skin necrosis: No Has patient had a PCN reaction that required hospitalization: No Has patient had a PCN reaction occurring within the last 10 years: No If all of the above answers are "NO", then may proceed with Cephalosporin use.  Other reaction(s): Other (See Comments) Has patient had a PCN reaction causing immediate rash, facial/tongue/throat swelling, SOB or lightheadedness with hypotension: No Has patient had a PCN reaction causing severe rash involving mucus membranes or skin necrosis: No Has patient had a PCN reaction that  required hospitalization: No Has patient had a PCN reaction occurring within the last 10 years: No If all of the above answers are "NO", then may proceed with Cephalosporin use. Has patient had a PCN reaction causing immediate rash, facial/tongue/throat swelling, SOB or lightheadedness with hypotension: No Has patient had a PCN reaction causing severe rash involving mucus membranes or skin necrosis: No Has patient had a PCN reaction that required hospitalization: No Has patient had a PCN reaction occurring within the last 10 years: No If all of the above answers are "NO", then may proceed with Cephalosporin use. Has patient had a PCN reaction causing immediate rash, facial/tongue/throat swelling, SOB or lightheadedness with hypotension: No Has patient had a PCN reaction causing severe rash involving mucus membranes or skin necrosis: No Has patient had a PCN reaction that required hospitalization: No Has patient had a PCN reaction occurring within the last 10 years: No If all of the above answers are "NO", then may proceed with Cephalosporin use. Has patient had a PCN reaction causing immediate rash, facial/tongue/throat swelling, SOB or lightheadedness with hypotension: No Has patient had a PCN reaction causing severe rash involving mucus membranes or skin necrosis: No Has patient had a PCN reaction that required hospitalization: No Has patient had a PCN reaction occurring within the last 10 years: No If all of the above answers are "NO", then may proceed with Cephalosporin use.   . Sulfa Antibiotics Hives and Rash    Current Outpatient Medications on File Prior to Visit  Medication Sig Dispense Refill  . albuterol (PROVENTIL HFA;VENTOLIN HFA) 108 (90 Base) MCG/ACT inhaler Inhale 2 puffs into the lungs every 6 (six) hours as needed for wheezing or shortness of breath. 1 Inhaler 1  . atorvastatin (LIPITOR) 40 MG tablet Take 1 tablet by mouth at bedtime for cholesterol. 90 tablet 1  . blood  glucose meter kit and supplies KIT Dispense based on patient and insurance preference. Use up three times daily as directed. (FOR ICD-9 250.00, 250.01). 1 each 0  . citalopram (CELEXA) 10 MG tablet   1  . empagliflozin (JARDIANCE) 25 MG TABS tablet Take 25 mg by mouth daily. 90 mg 3  . fluticasone (FLONASE) 50 MCG/ACT nasal spray Place 2 sprays into both nostrils daily. 16 g 0  . hydrOXYzine (ATARAX/VISTARIL) 50 MG tablet   0  . Insulin Glargine (LANTUS) 100 UNIT/ML Solostar Pen Inject 14 Units into the skin every evening. 15 mL 5  . Insulin Pen Needle (PEN NEEDLES) 31G X 6 MM MISC Use with insulin as directed. 100 each 2  . losartan (COZAAR) 100 MG tablet Take 1 tablet by mouth once daily for blood pressure.  90 tablet 3  . lurasidone (LATUDA) 40 MG TABS tablet Take by mouth.    . metFORMIN (GLUCOPHAGE) 1000 MG tablet Take 1 tablet (1,000 mg total) by mouth 2 (two) times daily with a meal. 180 tablet 3   No current facility-administered medications on file prior to visit.     BP 116/76   Pulse 94   Temp 98.2 F (36.8 C) (Oral)   Ht _0  (1.676 m)   Wt 270 lb (122.5 kg)   LMP 12/28/2017   SpO2 98%   BMI 43.58 kg/m    Objective:   Physical Exam  Constitutional: She appears well-nourished. She appears ill.  HENT:  Right Ear: Tympanic membrane and ear canal normal.  Left Ear: Tympanic membrane and ear canal normal.  Nose: No mucosal edema. Right sinus exhibits no maxillary sinus tenderness and no frontal sinus tenderness. Left sinus exhibits no maxillary sinus tenderness and no frontal sinus tenderness.  Mouth/Throat: Oropharynx is clear and moist.  Neck: Neck supple.  Cardiovascular: Normal rate and regular rhythm.  Respiratory: Effort normal and breath sounds normal. She has no wheezes.  Skin: Skin is warm.  Mildly clammy skin           Assessment & Plan:  Fever  Sudden onset of fever, chills, body aches x 24 hours ago. Appears ill, not toxic. Exam overall  unremarkable, clear lungs. Unlikely influenza given season, but given history of influenza will check. Rapid flu: Negative. Do suspect viral etiology. Discussed supportive measures.  Pleas Koch, NP

## 2018-01-13 DIAGNOSIS — M7502 Adhesive capsulitis of left shoulder: Secondary | ICD-10-CM | POA: Diagnosis not present

## 2018-01-20 DIAGNOSIS — J309 Allergic rhinitis, unspecified: Secondary | ICD-10-CM | POA: Diagnosis not present

## 2018-01-20 DIAGNOSIS — R05 Cough: Secondary | ICD-10-CM | POA: Diagnosis not present

## 2018-01-20 DIAGNOSIS — N912 Amenorrhea, unspecified: Secondary | ICD-10-CM | POA: Diagnosis not present

## 2018-01-20 DIAGNOSIS — T781XXA Other adverse food reactions, not elsewhere classified, initial encounter: Secondary | ICD-10-CM | POA: Diagnosis not present

## 2018-01-23 ENCOUNTER — Encounter: Payer: Self-pay | Admitting: *Deleted

## 2018-02-03 DIAGNOSIS — F329 Major depressive disorder, single episode, unspecified: Secondary | ICD-10-CM | POA: Diagnosis not present

## 2018-02-10 ENCOUNTER — Emergency Department: Payer: BLUE CROSS/BLUE SHIELD

## 2018-02-10 ENCOUNTER — Other Ambulatory Visit: Payer: Self-pay

## 2018-02-10 ENCOUNTER — Encounter: Payer: Self-pay | Admitting: Emergency Medicine

## 2018-02-10 ENCOUNTER — Emergency Department
Admission: EM | Admit: 2018-02-10 | Discharge: 2018-02-10 | Disposition: A | Discharge status: Home / Self Care | Payer: BLUE CROSS/BLUE SHIELD | Attending: Emergency Medicine | Admitting: Emergency Medicine

## 2018-02-10 DIAGNOSIS — I10 Essential (primary) hypertension: Secondary | ICD-10-CM | POA: Insufficient documentation

## 2018-02-10 DIAGNOSIS — N912 Amenorrhea, unspecified: Secondary | ICD-10-CM | POA: Diagnosis not present

## 2018-02-10 DIAGNOSIS — N760 Acute vaginitis: Secondary | ICD-10-CM | POA: Diagnosis not present

## 2018-02-10 DIAGNOSIS — E119 Type 2 diabetes mellitus without complications: Secondary | ICD-10-CM | POA: Diagnosis not present

## 2018-02-10 DIAGNOSIS — R109 Unspecified abdominal pain: Secondary | ICD-10-CM

## 2018-02-10 DIAGNOSIS — Z3200 Encounter for pregnancy test, result unknown: Secondary | ICD-10-CM | POA: Insufficient documentation

## 2018-02-10 DIAGNOSIS — Z794 Long term (current) use of insulin: Secondary | ICD-10-CM | POA: Insufficient documentation

## 2018-02-10 DIAGNOSIS — Z79899 Other long term (current) drug therapy: Secondary | ICD-10-CM | POA: Insufficient documentation

## 2018-02-10 DIAGNOSIS — R1031 Right lower quadrant pain: Secondary | ICD-10-CM | POA: Insufficient documentation

## 2018-02-10 DIAGNOSIS — B9689 Other specified bacterial agents as the cause of diseases classified elsewhere: Secondary | ICD-10-CM | POA: Diagnosis not present

## 2018-02-10 LAB — COMPREHENSIVE METABOLIC PANEL
ALT: 19 U/L (ref 0–44)
AST: 20 U/L (ref 15–41)
Albumin: 4.3 g/dL (ref 3.5–5.0)
Alkaline Phosphatase: 122 U/L (ref 38–126)
Anion gap: 10 (ref 5–15)
BUN: 12 mg/dL (ref 6–20)
CO2: 26 mmol/L (ref 22–32)
Calcium: 9.3 mg/dL (ref 8.9–10.3)
Chloride: 101 mmol/L (ref 98–111)
Creatinine, Ser: 0.69 mg/dL (ref 0.44–1.00)
GFR calc Af Amer: >60 mL/min (ref >60)
GFR calc non Af Amer: >60 mL/min (ref >60)
Glucose, Bld: 216 mg/dL — ABNORMAL HIGH (ref 70–99)
Potassium: 4 mmol/L (ref 3.5–5.1)
Sodium: 137 mmol/L (ref 135–145)
Total Bilirubin: 0.4 mg/dL (ref 0.3–1.2)
Total Protein: 7.7 g/dL (ref 6.5–8.1)

## 2018-02-10 LAB — URINALYSIS, COMPLETE (UACMP) WITH MICROSCOPIC
Bilirubin Urine: NEGATIVE
Glucose, UA: >=500 mg/dL — AB
Hgb urine dipstick: NEGATIVE
Ketones, ur: NEGATIVE mg/dL
Leukocytes, UA: NEGATIVE
Nitrite: NEGATIVE
Protein, ur: NEGATIVE mg/dL
Specific Gravity, Urine: 1.036 — ABNORMAL HIGH (ref 1.005–1.030)
pH: 7 (ref 5.0–8.0)

## 2018-02-10 LAB — CBC
HCT: 42.2 % (ref 35.0–47.0)
Hemoglobin: 14 g/dL (ref 12.0–16.0)
MCH: 27.8 pg (ref 26.0–34.0)
MCHC: 33.2 g/dL (ref 32.0–36.0)
MCV: 83.6 fL (ref 80.0–100.0)
Platelets: 296 10*3/uL (ref 150–440)
RBC: 5.05 MIL/uL (ref 3.80–5.20)
RDW: 13.2 % (ref 11.5–14.5)
WBC: 6.9 10*3/uL (ref 3.6–11.0)

## 2018-02-10 LAB — POC URINE PREG, ED: Preg Test, Ur: NEGATIVE

## 2018-02-10 LAB — WET PREP, GENITAL
Sperm: NONE SEEN
Trich, Wet Prep: NONE SEEN
Yeast Wet Prep HPF POC: NONE SEEN

## 2018-02-10 LAB — HCG, QUANTITATIVE, PREGNANCY: hCG, Beta Chain, Quant, S: <1 m[IU]/mL (ref <5)

## 2018-02-10 LAB — LIPASE, BLOOD: Lipase: 35 U/L (ref 11–51)

## 2018-02-10 MED ORDER — ONDANSETRON HCL 4 MG/2ML IJ SOLN
4.0000 mg | Freq: Once | INTRAMUSCULAR | Status: AC
  Administered 2018-02-10: 4 mg via INTRAVENOUS
  Filled 2018-02-10: qty 2

## 2018-02-10 MED ORDER — METRONIDAZOLE 500 MG PO TABS: 500.0000 mg | ORAL_TABLET | Freq: Two times a day (BID) | ORAL | 0 refills | Status: AC

## 2018-02-10 MED ORDER — CARISOPRODOL 350 MG PO TABS: 350.0000 mg | ORAL_TABLET | Freq: Three times a day (TID) | ORAL | 0 refills | Status: DC | PRN

## 2018-02-10 MED ORDER — METRONIDAZOLE 500 MG PO TABS
500.0000 mg | ORAL_TABLET | Freq: Once | ORAL | Status: AC
  Administered 2018-02-10: 500 mg via ORAL
  Filled 2018-02-10: qty 1

## 2018-02-10 MED ORDER — MORPHINE SULFATE (PF) 4 MG/ML IV SOLN
4.0000 mg | Freq: Once | INTRAVENOUS | Status: AC
  Administered 2018-02-10: 4 mg via INTRAVENOUS
  Filled 2018-02-10: qty 1

## 2018-02-10 MED ORDER — SODIUM CHLORIDE 0.9 % IV BOLUS
1000.0000 mL | Freq: Once | INTRAVENOUS | Status: AC
  Administered 2018-02-10: 1000 mL via INTRAVENOUS

## 2018-02-10 NOTE — ED Notes (Signed)
Patient transported to CT 

## 2018-02-10 NOTE — ED Provider Notes (Signed)
[facility] Emergency Department Provider Note ____________________________________________   First MD Initiated Contact with Patient 02/10/18 1607     (approximate)  I have reviewed the triage vital signs and the nursing notes.   HISTORY  Chief Complaint Abdominal Pain  HPI Joan [name] is a 37 y.o. female Struve ovarian cysts as well as miscarriage and posttraumatic stress disorder who is presenting to the emergency department today with right lower quadrant abdominal pain.  Says the pain started suddenly at about noon today.  She was just lying down and was not active at the time.  Denies radiation into her back.  Says the pain is intermittently severe but is moderate at this time and sharp.  Denies any vaginal bleeding or discharge.  Denies any burning with urination.  Says that she has not had a period for 4 months and that she has had multiple positive pregnancy test at home but went to her OB/GYN's and had a negative pregnancy test.  Says just 2 days ago she had a pregnancy test that she did at home which was a "faint line."  Denies any history of kidney stones.  Says that when the pain is been severe that she has been nauseous.  Past Medical History:  Diagnosis Date  . Auditory hallucinations   . Diabetes mellitus without complication (HCC)    diet controlled  . Essential hypertension   . Essential hypertension 11/24/2017  . MDD (major depressive disorder)   . Miscarriage   . PTSD (post-traumatic stress disorder)     Patient Active Problem List   Diagnosis Date Noted  . Type 2 diabetes mellitus without complication, without long-term current use of insulin ([place]) 11/24/2017  . Essential hypertension 11/24/2017  . Hyperlipidemia 11/24/2017  . Major depressive disorder, recurrent, severe without psychotic features ([place])   . PTSD (post-traumatic stress disorder) 04/11/2015    Past Surgical History:  Procedure Laterality Date  . ADENOIDECTOMY      . CHOLECYSTECTOMY  2009  . TONSILLECTOMY      Prior to Admission medications   Medication Sig Start Date End Date Taking? Authorizing Provider  albuterol (PROVENTIL HFA;VENTOLIN HFA) 108 (90 Base) MCG/ACT inhaler Inhale 2 puffs into the lungs every 6 (six) hours as needed for wheezing or shortness of breath. 12/15/17   Elby Beck, FNP  atorvastatin (LIPITOR) 40 MG tablet Take 1 tablet by mouth at bedtime for cholesterol. 11/26/17   Pleas Koch, NP  blood glucose meter kit and supplies KIT Dispense based on patient and insurance preference. Use up three times daily as directed. (FOR ICD-9 250.00, 250.01). 11/24/17   Pleas Koch, NP  citalopram (CELEXA) 10 MG tablet  11/28/17   [provider]  empagliflozin (JARDIANCE) 25 MG TABS tablet Take 25 mg by mouth daily. 12/18/17   Pleas Koch, NP  fluticasone (FLONASE) 50 MCG/ACT nasal spray Place 2 sprays into both nostrils daily. 10/23/17   Menshew, Dannielle Karvonen, PA-C  hydrOXYzine (ATARAX/VISTARIL) 50 MG tablet  11/28/17   [provider]  Insulin Glargine (LANTUS) 100 UNIT/ML Solostar Pen Inject 14 Units into the skin every evening. 12/18/17   Pleas Koch, NP  Insulin Pen Needle (PEN NEEDLES) 31G X 6 MM MISC Use with insulin as directed. 11/25/17   Pleas Koch, NP  losartan (COZAAR) 100 MG tablet Take 1 tablet by mouth once daily for blood pressure. 12/18/17   Pleas Koch, NP  lurasidone (LATUDA) 40 MG TABS tablet  Take by mouth.    [provider]  metFORMIN (GLUCOPHAGE) 1000 MG tablet Take 1 tablet (1,000 mg total) by mouth 2 (two) times daily with a meal. 12/18/17   Pleas Koch, NP    Allergies Ciprofloxacin; Ibuprofen; Penicillins; and Sulfa antibiotics  Family History  Problem Relation Age of Onset  . Asthma Mother   . Diabetes Mother   . Hyperlipidemia Mother   . Hypertension Mother   . Diabetes Father   . Hyperlipidemia Father   . Hypertension Father   .  Diabetes Brother   . Depression Brother   . Alcohol abuse Brother   . Depression Maternal Grandmother   . Stroke Paternal Grandmother     Social History Social History   Tobacco Use  . Smoking status: Never Smoker  . Smokeless tobacco: Never Used  Substance Use Topics  . Alcohol use: No  . Drug use: No    Review of Systems  Constitutional: No fever/chills Eyes: No visual changes. ENT: No sore throat. Cardiovascular: Denies chest pain. Respiratory: Denies shortness of breath. Gastrointestinal:  No diarrhea.  No constipation. Genitourinary: Negative for dysuria. Musculoskeletal: Negative for back pain. Skin: Negative for rash. Neurological: Negative for headaches, focal weakness or numbness.   ____________________________________________   PHYSICAL EXAM:  VITAL SIGNS: ED Triage Vitals  Enc Vitals Group     BP 02/10/18 1523 (!) 133/97     Pulse Rate 02/10/18 1523 98     Resp 02/10/18 1523 16     Temp 02/10/18 1523 97.8 F (36.6 C)     Temp Source 02/10/18 1523 Oral     SpO2 02/10/18 1523 100 %     Weight 02/10/18 1524 269 lb (122 kg)     Height 02/10/18 1524 '5\' 6"'  (1.676 m)     Head Circumference --      Peak Flow --      Pain Score 02/10/18 1524 10     Pain Loc --      Pain Edu? --      Excl. in [place]? --     Constitutional: Alert and oriented. Well appearing and in no acute distress. Eyes: Conjunctivae are normal.  Head: Atraumatic. Nose: No congestion/rhinnorhea. Mouth/Throat: Mucous membranes are moist.  Neck: No stridor.   Cardiovascular: Normal rate, regular rhythm. Grossly normal heart sounds.  Good peripheral circulation. Respiratory: Normal respiratory effort.  No retractions. Lungs CTAB. Gastrointestinal: Soft with right lower quadrant as well as right lower flank tenderness to palpation which is moderate without any rebound or guarding.  No distention. No CVA tenderness. Genitourinary: External exam without any lesions.  Speculum exam with a  small amount of yellow discharge.  No bleeding.  Bimanual exam without CMT.  No uterine or left adnexal tenderness to palpation.  However, there is moderate right adnexal tenderness to palpation without any mass palpated. Musculoskeletal: No lower extremity tenderness nor edema.  No joint effusions. Neurologic:  Normal speech and language. No gross focal neurologic deficits are appreciated. Skin:  Skin is warm, dry and intact. No rash noted. Psychiatric: Mood and affect are normal. Speech and behavior are normal.  ____________________________________________   LABS (all labs ordered are listed, but only abnormal results are displayed)  Labs Reviewed  WET PREP, GENITAL - Abnormal; Notable for the following components:      Result Value   Clue Cells Wet Prep HPF POC PRESENT (*)    WBC, Wet Prep HPF POC FEW (*)    All other components within  normal limits  COMPREHENSIVE METABOLIC PANEL - Abnormal; Notable for the following components:   Glucose, Bld 216 (*)    All other components within normal limits  URINALYSIS, COMPLETE (UACMP) WITH MICROSCOPIC - Abnormal; Notable for the following components:   Color, Urine STRAW (*)    APPearance CLEAR (*)    Specific Gravity, Urine 1.036 (*)    Glucose, UA >=500 (*)    Bacteria, UA RARE (*)    All other components within normal limits  LIPASE, BLOOD  CBC  HCG, QUANTITATIVE, PREGNANCY  POC URINE PREG, ED   ____________________________________________  EKG   ____________________________________________  RADIOLOGY  No acute finding on the CT renal study. ____________________________________________   PROCEDURES  Procedure(s) performed:   Procedures  Critical Care performed:   ____________________________________________   INITIAL IMPRESSION / ASSESSMENT AND PLAN / ED COURSE  Pertinent labs & imaging results that were available during my care of the patient were reviewed by me and considered in my medical decision making (see  chart for details).  Differential diagnosis includes, but is not limited to, ovarian cyst, ovarian torsion, acute appendicitis, diverticulitis, urinary tract infection/pyelonephritis, endometriosis, bowel obstruction, colitis, renal colic, gastroenteritis, hernia, fibroids, endometriosis, pregnancy related pain including ectopic pregnancy, etc. As part of my medical decision making, I reviewed the following data within the [facility] visits including a recent ultrasound from late June which did not show any uterine or adnexal masses.  ----------------------------------------- 6:35 PM on 02/10/2018 -----------------------------------------  Patient positive for bacterial vaginosis but otherwise with reassuring studies.  Possibly musculoskeletal pain versus midcycle pain.  I will give her muscle relaxer as well as Flagyl for the bacterial vaginosis.  She states now that she took out an IUD in March and then had regular periods for 2 months.  Then she has been amenorrheic since.  Possible that this pain is related to her menstrual cycle.  However, does not appear to be an acute finding during this visit that would require further testing or admission.  She will continue to follow with her OB/GYN the [facility].  She is understanding of the diagnosis as well as treatment and willing to comply. ____________________________________________   FINAL CLINICAL IMPRESSION(S) / ED DIAGNOSES  Flank pain.  Bacterial vaginosis.  Amenorrhea.    NEW MEDICATIONS STARTED DURING THIS VISIT:  New Prescriptions   No medications on file     Note:  This document was prepared using Dragon voice recognition software and may include unintentional dictation errors.     Orbie Pyo, MD 02/10/18 [id number]

## 2018-02-10 NOTE — ED Triage Notes (Signed)
Pt reports that she hasnt had a period menstral cycle since MAy she took urine preg test 4 times and it was positive. She went to her PMD and they took her blood it was negative. She took another urine preg test it was faint. Today her lower right abd began hurting and her OBGYN told her to come her to be evaluated.

## 2018-02-12 ENCOUNTER — Other Ambulatory Visit: Payer: Self-pay

## 2018-02-12 ENCOUNTER — Emergency Department
Admission: EM | Admit: 2018-02-12 | Discharge: 2018-02-12 | Disposition: A | Discharge status: Home / Self Care | Payer: BLUE CROSS/BLUE SHIELD | Attending: Emergency Medicine | Admitting: Emergency Medicine

## 2018-02-12 ENCOUNTER — Emergency Department: Payer: BLUE CROSS/BLUE SHIELD

## 2018-02-12 ENCOUNTER — Telehealth: Payer: Self-pay | Admitting: Primary Care

## 2018-02-12 ENCOUNTER — Encounter: Payer: Self-pay | Admitting: Emergency Medicine

## 2018-02-12 ENCOUNTER — Encounter: Payer: Self-pay | Admitting: *Deleted

## 2018-02-12 DIAGNOSIS — R112 Nausea with vomiting, unspecified: Secondary | ICD-10-CM | POA: Diagnosis not present

## 2018-02-12 DIAGNOSIS — R1084 Generalized abdominal pain: Secondary | ICD-10-CM | POA: Diagnosis not present

## 2018-02-12 DIAGNOSIS — E119 Type 2 diabetes mellitus without complications: Secondary | ICD-10-CM | POA: Insufficient documentation

## 2018-02-12 DIAGNOSIS — Z79899 Other long term (current) drug therapy: Secondary | ICD-10-CM | POA: Insufficient documentation

## 2018-02-12 DIAGNOSIS — Z7984 Long term (current) use of oral hypoglycemic drugs: Secondary | ICD-10-CM | POA: Diagnosis not present

## 2018-02-12 DIAGNOSIS — R1031 Right lower quadrant pain: Secondary | ICD-10-CM | POA: Diagnosis not present

## 2018-02-12 DIAGNOSIS — K2900 Acute gastritis without bleeding: Secondary | ICD-10-CM

## 2018-02-12 DIAGNOSIS — R109 Unspecified abdominal pain: Secondary | ICD-10-CM | POA: Diagnosis not present

## 2018-02-12 DIAGNOSIS — I10 Essential (primary) hypertension: Secondary | ICD-10-CM | POA: Diagnosis not present

## 2018-02-12 LAB — BASIC METABOLIC PANEL
Anion gap: 9 (ref 5–15)
BUN: 13 mg/dL (ref 6–20)
CO2: 25 mmol/L (ref 22–32)
Calcium: 8.7 mg/dL — ABNORMAL LOW (ref 8.9–10.3)
Chloride: 104 mmol/L (ref 98–111)
Creatinine, Ser: 0.68 mg/dL (ref 0.44–1.00)
GFR calc Af Amer: >60 mL/min (ref >60)
GFR calc non Af Amer: >60 mL/min (ref >60)
Glucose, Bld: 256 mg/dL — ABNORMAL HIGH (ref 70–99)
Potassium: 3.9 mmol/L (ref 3.5–5.1)
Sodium: 138 mmol/L (ref 135–145)

## 2018-02-12 LAB — CBC WITH DIFFERENTIAL/PLATELET
Basophils Absolute: 0 10*3/uL (ref 0–0.1)
Basophils Relative: 1 %
Eosinophils Absolute: 0.2 10*3/uL (ref 0–0.7)
Eosinophils Relative: 4 %
HCT: 39.8 % (ref 35.0–47.0)
Hemoglobin: 13.1 g/dL (ref 12.0–16.0)
Lymphocytes Relative: 29 %
Lymphs Abs: 1.5 10*3/uL (ref 1.0–3.6)
MCH: 27.7 pg (ref 26.0–34.0)
MCHC: 32.8 g/dL (ref 32.0–36.0)
MCV: 84.4 fL (ref 80.0–100.0)
Monocytes Absolute: 0.3 10*3/uL (ref 0.2–0.9)
Monocytes Relative: 7 %
Neutro Abs: 2.9 10*3/uL (ref 1.4–6.5)
Neutrophils Relative %: 59 %
Platelets: 268 10*3/uL (ref 150–440)
RBC: 4.72 MIL/uL (ref 3.80–5.20)
RDW: 13 % (ref 11.5–14.5)
WBC: 4.9 10*3/uL (ref 3.6–11.0)

## 2018-02-12 MED ORDER — FAMOTIDINE 20 MG PO TABS
20.0000 mg | ORAL_TABLET | Freq: Once | ORAL | Status: AC
  Administered 2018-02-12: 20 mg via ORAL
  Filled 2018-02-12: qty 1

## 2018-02-12 MED ORDER — KETOROLAC TROMETHAMINE 30 MG/ML IJ SOLN
15.0000 mg | Freq: Once | INTRAMUSCULAR | Status: AC
  Administered 2018-02-12: 15 mg via INTRAVENOUS

## 2018-02-12 MED ORDER — ALUMINUM-MAGNESIUM-SIMETHICONE 200-200-20 MG/5ML PO SUSP: 30.0000 mL | Freq: Three times a day (TID) | ORAL | 0 refills | Status: DC

## 2018-02-12 MED ORDER — METOCLOPRAMIDE HCL 10 MG PO TABS: 10.0000 mg | ORAL_TABLET | Freq: Four times a day (QID) | ORAL | 0 refills | Status: DC | PRN

## 2018-02-12 MED ORDER — IOHEXOL 300 MG/ML  SOLN
100.0000 mL | Freq: Once | INTRAMUSCULAR | Status: AC | PRN
  Administered 2018-02-12: 100 mL via INTRAVENOUS

## 2018-02-12 MED ORDER — SODIUM CHLORIDE 0.9 % IV BOLUS
1000.0000 mL | Freq: Once | INTRAVENOUS | Status: AC
  Administered 2018-02-12: 1000 mL via INTRAVENOUS

## 2018-02-12 MED ORDER — KETOROLAC TROMETHAMINE 30 MG/ML IJ SOLN
30.0000 mg | Freq: Once | INTRAMUSCULAR | Status: DC
  Filled 2018-02-12: qty 1

## 2018-02-12 MED ORDER — ONDANSETRON 4 MG PO TBDP: 4.0000 mg | ORAL_TABLET | Freq: Three times a day (TID) | ORAL | 0 refills | Status: DC | PRN

## 2018-02-12 MED ORDER — FAMOTIDINE 20 MG PO TABS: 20.0000 mg | ORAL_TABLET | Freq: Two times a day (BID) | ORAL | 0 refills | Status: DC

## 2018-02-12 MED ORDER — GI COCKTAIL ~~LOC~~
30.0000 mL | Freq: Once | ORAL | Status: AC
  Administered 2018-02-12: 30 mL via ORAL
  Filled 2018-02-12: qty 30

## 2018-02-12 NOTE — ED Notes (Signed)
Pt did eat crackers and soda, she is now complaining of nausea. MD is aware and is going into speak with pt.

## 2018-02-12 NOTE — Telephone Encounter (Signed)
Noted. ED visit reviewed. CT abdomen/pelvis unremarkable. Treated for bacterial vaginosis.

## 2018-02-12 NOTE — ED Notes (Signed)
Patient transported to CT 

## 2018-02-12 NOTE — ED Notes (Signed)
Pt returned from CT at this time.  

## 2018-02-12 NOTE — ED Provider Notes (Signed)
[facility] Emergency Department Provider Note  ____________________________________________  Time seen: Approximately 2:18 PM  I have reviewed the triage vital signs and the nursing notes.   HISTORY  Chief Complaint Abdominal Pain    HPI Joan [name] is a 37 y.o. female with a history of hypertension and diabetes who complains of right lower quadrant abdominal pain worsening for the past 3 days.  She was seen in the ED 2 days ago and had labs and a CT scan which were unremarkable.  The CT scan was without contrast at that time.  She reports that over the last 2 days since then her pain has gradually worsening, now radiating to the right lower back.   Associate with nausea vomiting and decreased appetite.  No fevers chills or sweats.  No diarrhea or constipation.  No dysuria frequency urgency vaginal bleeding or discharge.  Pain is constant, waxing and waning, no aggravating or alleviating factors, moderate intensity.     Past Medical History:  Diagnosis Date  . Auditory hallucinations   . Diabetes mellitus without complication (HCC)    diet controlled  . Essential hypertension   . Essential hypertension 11/24/2017  . MDD (major depressive disorder)   . Miscarriage   . PTSD (post-traumatic stress disorder)      Patient Active Problem List   Diagnosis Date Noted  . Type 2 diabetes mellitus without complication, without long-term current use of insulin ([place]) 11/24/2017  . Essential hypertension 11/24/2017  . Hyperlipidemia 11/24/2017  . Major depressive disorder, recurrent, severe without psychotic features ([place])   . PTSD (post-traumatic stress disorder) 04/11/2015     Past Surgical History:  Procedure Laterality Date  . ADENOIDECTOMY    . CHOLECYSTECTOMY  2009  . TONSILLECTOMY       Prior to Admission medications   Medication Sig Start Date End Date Taking? Authorizing Provider  albuterol (PROVENTIL HFA;VENTOLIN HFA) 108 (90 Base) MCG/ACT  inhaler Inhale 2 puffs into the lungs every 6 (six) hours as needed for wheezing or shortness of breath. 12/15/17   Elby Beck, FNP  aluminum-magnesium hydroxide-simethicone (MAALOX) 832-919-16 MG/5ML SUSP Take 30 mLs by mouth 4 (four) times daily -  before meals and at bedtime. 02/12/18   Carrie Mew, MD  atorvastatin (LIPITOR) 40 MG tablet Take 1 tablet by mouth at bedtime for cholesterol. 11/26/17   Pleas Koch, NP  blood glucose meter kit and supplies KIT Dispense based on patient and insurance preference. Use up three times daily as directed. (FOR ICD-9 250.00, 250.01). 11/24/17   Pleas Koch, NP  carisoprodol (SOMA) 350 MG tablet Take 1 tablet (350 mg total) by mouth 3 (three) times daily as needed. 02/10/18   Orbie Pyo, MD  citalopram (CELEXA) 10 MG tablet  11/28/17   [provider]  empagliflozin (JARDIANCE) 25 MG TABS tablet Take 25 mg by mouth daily. 12/18/17   Pleas Koch, NP  famotidine (PEPCID) 20 MG tablet Take 1 tablet (20 mg total) by mouth 2 (two) times daily. 02/12/18   Carrie Mew, MD  fluticasone [facility]) 50 MCG/ACT nasal spray Place 2 sprays into both nostrils daily. 10/23/17   Menshew, Dannielle Karvonen, PA-C  hydrOXYzine (ATARAX/VISTARIL) 50 MG tablet  11/28/17   [provider]  Insulin Glargine (LANTUS) 100 UNIT/ML Solostar Pen Inject 14 Units into the skin every evening. 12/18/17   Pleas Koch, NP  Insulin Pen Needle (PEN NEEDLES) 31G X 6 MM MISC Use with insulin as directed.  11/25/17   Pleas Koch, NP  losartan (COZAAR) 100 MG tablet Take 1 tablet by mouth once daily for blood pressure. 12/18/17   Pleas Koch, NP  lurasidone (LATUDA) 40 MG TABS tablet Take by mouth.    [provider]  metFORMIN (GLUCOPHAGE) 1000 MG tablet Take 1 tablet (1,000 mg total) by mouth 2 (two) times daily with a meal. 12/18/17   Pleas Koch, NP  metoCLOPramide (REGLAN) 10 MG tablet Take 1 tablet (10 mg  total) by mouth every 6 (six) hours as needed. 02/12/18   Carrie Mew, MD  metroNIDAZOLE (FLAGYL) 500 MG tablet Take 1 tablet (500 mg total) by mouth 2 (two) times daily for 7 days. 02/10/18 02/17/18  Orbie Pyo, MD  ondansetron (ZOFRAN ODT) 4 MG disintegrating tablet Take 1 tablet (4 mg total) by mouth every 8 (eight) hours as needed for nausea or vomiting. 02/12/18   Carrie Mew, MD     Allergies Ciprofloxacin; Ibuprofen; Penicillins; and Sulfa antibiotics   Family History  Problem Relation Age of Onset  . Asthma Mother   . Diabetes Mother   . Hyperlipidemia Mother   . Hypertension Mother   . Diabetes Father   . Hyperlipidemia Father   . Hypertension Father   . Diabetes Brother   . Depression Brother   . Alcohol abuse Brother   . Depression Maternal Grandmother   . Stroke Paternal Grandmother     Social History Social History   Tobacco Use  . Smoking status: Never Smoker  . Smokeless tobacco: Never Used  Substance Use Topics  . Alcohol use: No  . Drug use: No    Review of Systems  Constitutional:   No fever or chills.  ENT:   No sore throat. No rhinorrhea. Cardiovascular:   No chest pain or syncope. Respiratory:   No dyspnea or cough. Gastrointestinal:   Positive for abdominal pain and vomiting.  Musculoskeletal:   Negative for focal pain or swelling All other systems reviewed and are negative except as documented above in ROS and HPI.  ____________________________________________   PHYSICAL EXAM:  VITAL SIGNS: ED Triage Vitals  Enc Vitals Group     BP 02/12/18 0743 (!) 152/90     Pulse Rate 02/12/18 0743 (!) 109     Resp 02/12/18 0743 16     Temp 02/12/18 0743 98.3 F (36.8 C)     Temp Source 02/12/18 0743 Oral     SpO2 02/12/18 0743 99 %     Weight 02/12/18 0744 269 lb (122 kg)     Height 02/12/18 0744 '5\' 6"'  (1.676 m)     Head Circumference --      Peak Flow --      Pain Score 02/12/18 0744 10     Pain Loc --      Pain Edu?  --      Excl. in [place]? --     Vital signs reviewed, nursing assessments reviewed.   Constitutional:   Alert and oriented. Non-toxic appearance. Eyes:   Conjunctivae are normal. EOMI. PERRL. ENT      Head:   Normocephalic and atraumatic.      Nose:   No congestion/rhinnorhea.       Mouth/Throat:   MMM, no pharyngeal erythema. No peritonsillar mass.       Neck:   No meningismus. Full ROM. Hematological/Lymphatic/Immunilogical:   No cervical lymphadenopathy. Cardiovascular:   RRR. Symmetric bilateral radial and DP pulses.  No murmurs.  Respiratory:  Normal respiratory effort without tachypnea/retractions. Breath sounds are clear and equal bilaterally. No wheezes/rales/rhonchi. Gastrointestinal:   Soft with right lower quadrant tenderness. Non distended. There is no CVA tenderness.  No rebound, rigidity, or guarding. Musculoskeletal:   Normal range of motion in all extremities. No joint effusions.  No lower extremity tenderness.  No edema. Neurologic:   Normal speech and language.  Motor grossly intact. No acute focal neurologic deficits are appreciated.  Skin:    Skin is warm, dry and intact. No rash noted.  No petechiae, purpura, or bullae.  ____________________________________________    LABS (pertinent positives/negatives) (all labs ordered are listed, but only abnormal results are displayed) Labs Reviewed  BASIC METABOLIC PANEL - Abnormal; Notable for the following components:      Result Value   Glucose, Bld 256 (*)    Calcium 8.7 (*)    All other components within normal limits  CBC WITH DIFFERENTIAL/PLATELET   ____________________________________________   EKG    ____________________________________________    RADIOLOGY  Ct Abdomen Pelvis W Contrast  Result Date: 02/12/2018 CLINICAL DATA:  Acute abdominal pain EXAM: CT ABDOMEN AND PELVIS WITH CONTRAST TECHNIQUE: Multidetector CT imaging of the abdomen and pelvis was performed using the standard protocol  following bolus administration of intravenous contrast. CONTRAST:  166m OMNIPAQUE IOHEXOL 300 MG/ML  SOLN COMPARISON:  CT abdomen pelvis 02/10/2018 FINDINGS: Lower chest: Lung bases clear. Hepatobiliary: Prior cholecystectomy with normal bile duct caliber. No liver lesion. Pancreas: Fatty changes in the pancreas appear chronic. Negative for pancreatitis or mass. No calcifications. Spleen: Negative Adrenals/Urinary Tract: Right adrenal nodule measuring approximately 14 mm unchanged. Left adrenal normal. Kidneys ureter and bladder normal. No renal mass or obstruction or calculus. Stomach/Bowel: Negative for bowel obstruction. Negative for bowel mass or edema. Normal appendix. Vascular/Lymphatic: Negative for vascular calcification. Negative for adenopathy. Reproductive: Normal uterus.  Negative for pelvic mass Other: Negative for free fluid Musculoskeletal: Negative IMPRESSION: No acute abnormality.  Normal appendix. Small right adrenal nodule stable. Electronically Signed   By: CFranchot GalloM.D.   On: 02/12/2018 09:20    ____________________________________________   PROCEDURES Procedures  ____________________________________________  DIFFERENTIAL DIAGNOSIS   Appendicitis, bowel obstruction, diverticulitis, ovarian cyst.  Low suspicion for STI PID TOA torsion  CLINICAL IMPRESSION / ASSESSMENT AND PLAN / ED COURSE  Pertinent labs & imaging results that were available during my care of the patient were reviewed by me and considered in my medical decision making (see chart for details).      Clinical Course as of Feb 12 1417  Thu Feb 12, 2018  0807 Worsening rlq pain, tachycardia. Exam concerning for appendicitis. Will obtain repeat CT a/p with IV con today.    [PS]    Clinical Course User Index [PS] SCarrie Mew MD     ----------------------------------------- 2:20 PM on 02/12/2018 -----------------------------------------  Labs normal, CT scan normal.  No evidence of  appendicitis or other acute intra-abdominal process.  Not pregnant per testing done 2 days ago.  She does have bacterial vaginosis and should continue Flagyl.  No abdominal symptoms can be spine by the bacterial vaginosis and her nausea vomiting gastritis symptoms can be due to the Flagyl itself.  Treat symptomatically, follow-up with primary care.  ____________________________________________   FINAL CLINICAL IMPRESSION(S) / ED DIAGNOSES    Final diagnoses:  Generalized abdominal pain  Acute gastritis without hemorrhage, unspecified gastritis type  Non-intractable vomiting with nausea, unspecified vomiting type     ED Discharge Orders  Ordered    aluminum-magnesium hydroxide-simethicone (MAALOX) 696-295-28 MG/5ML SUSP  3 times daily before meals & bedtime     02/12/18 1418    famotidine (PEPCID) 20 MG tablet  2 times daily     02/12/18 1418    metoCLOPramide (REGLAN) 10 MG tablet  Every 6 hours PRN     02/12/18 1418    ondansetron (ZOFRAN ODT) 4 MG disintegrating tablet  Every 8 hours PRN     02/12/18 1418      Portions of this note were generated with dragon dictation software. Dictation errors may occur despite best attempts at proofreading.    Carrie Mew, MD 02/12/18 1421

## 2018-02-12 NOTE — ED Notes (Signed)
Pt is upset that we can not admit her and have GI run more test. She reports that she was still in pain and nauseated. The entire time pt was here she was asleep in room and no emesis was seen. She was able to eat crackers and keep down Diet Sprite. She was ambulatory to POV without difficulty. Went over discharge instructions, RX and follow up. Did not sign when she left she was to upset.

## 2018-02-12 NOTE — ED Triage Notes (Signed)
Pt via pov from home with worsening abdominal pain. Was seen for the same on 7/9 and it has continued to worsen. Pt states she was given antibiotics and has taken 3 doses but pain is worse instead of better. Pt reports lower abdominal pain on both sides, beginning at right and "shooting" to left. Pt alert & oriented, NAD noted.

## 2018-02-12 NOTE — ED Notes (Signed)
Diet Sprite and Graham crackers given to pt for PO challenge.

## 2018-02-12 NOTE — ED Notes (Signed)
Pt resting in room with eyes closed. Will continue to monitor. VSS. NAD

## 2018-02-12 NOTE — Discharge Instructions (Signed)
Your labs and CT scan were unremarkable today. Please follow up with your doctor for continued monitoring of your symptoms.  Try antacids in the meantime for symptom control. Be sure to stay well hydrated and follow a simple diet.

## 2018-02-12 NOTE — Telephone Encounter (Signed)
See below CRM Pt has appointment 7/12 with dr Ermalene Searingbedsole  Copied from CRM [id number]. Topic: Inquiry >> Feb 12, 2018  2:54 PM Yvonna Alanisobinson, Andra M wrote: Reason for CRM: Patient has been in a lot of pain and went to [facility] ED. They recommended that patient see Natalia LeatherwoodKatherine. Patient wants to know if Natalia LeatherwoodKatherine would work her in for a visit on tomorrow.       Thank You!!!

## 2018-02-13 ENCOUNTER — Ambulatory Visit: Payer: BLUE CROSS/BLUE SHIELD | Admitting: Family Medicine

## 2018-02-13 ENCOUNTER — Encounter: Payer: Self-pay | Admitting: Family Medicine

## 2018-02-13 DIAGNOSIS — N760 Acute vaginitis: Secondary | ICD-10-CM

## 2018-02-13 DIAGNOSIS — B9689 Other specified bacterial agents as the cause of diseases classified elsewhere: Secondary | ICD-10-CM

## 2018-02-13 DIAGNOSIS — R1031 Right lower quadrant pain: Secondary | ICD-10-CM

## 2018-02-13 DIAGNOSIS — E119 Type 2 diabetes mellitus without complications: Secondary | ICD-10-CM | POA: Diagnosis not present

## 2018-02-13 HISTORY — DX: Other specified bacterial agents as the cause of diseases classified elsewhere: B96.89

## 2018-02-13 HISTORY — DX: Other specified bacterial agents as the cause of diseases classified elsewhere: N76.0

## 2018-02-13 HISTORY — DX: Right lower quadrant pain: R10.31

## 2018-02-13 MED ORDER — TRAMADOL HCL 50 MG PO TABS: 50.0000 mg | ORAL_TABLET | Freq: Three times a day (TID) | ORAL | 0 refills | Status: DC | PRN

## 2018-02-13 MED ORDER — METRONIDAZOLE 0.75 % VA GEL: 1.0000 | Freq: Two times a day (BID) | VAGINAL | 0 refills | Status: AC

## 2018-02-13 NOTE — Assessment & Plan Note (Signed)
Neg serum preg Pt refuses Gc/Chlam as no need per pt  neg CT x 2 with and without contrast.  No sig of appendicitis, kidney stone  neg UA  Nml CMET and CBC except for DM control  No abcess or mass seen.  US in June nml... Pt not representative of  Ovarian torsion as continuous  Most likely colon related.. Now pain worst with BMs.. Straining. ? Due to adhesions with 2 past abd surgeries  Start miralax , water, tramadol as needed for pain.  Pt will call if pain not improving with regualrity of BMs or if severe pain.  ER return precautions given.

## 2018-02-13 NOTE — Assessment & Plan Note (Signed)
Change to per vag metro given oral likely making her feel worse.

## 2018-02-13 NOTE — Assessment & Plan Note (Signed)
Poor control.. Worsening. Increase lantus  2 units.. Reviewed low carb diet.

## 2018-02-13 NOTE — Progress Notes (Signed)
Subjective:    Patient ID: Joan [name], female    DOB: April 04, 1981, 37 y.o.   MRN: [id number]  HPI  37 year old female pt of Graylon Gunning presents for new onset RLQ pain.   IUD removed  in March  [place] at [facility] in June showing no uterine or ovarian massess  She was seen at ER on 02/10/2018  cbc nml, neg upreg, neg UA, wet prep showed clue cells.  Treated for BV.  CT scan NO CONTRAST unremarkable  Felt MSK pain.. Given muscle relaxer, nausea med and pain med.  Returned to ER on 02/12/2018 with  Worsening generalized abd pain radiating to RLQ.Marland Kitchen Dx with acute gastritis  Given GI cocktail, famotidine, metoclopramide and zofran.  Labs showed elevated glucose at 256, nml cbc Repeated CT pelvis WITH contrast: No acute abnormality.  Normal appendix. Small right adrenal nodule stable.   Felt nausea and gastritis symptoms could be from the flagyl itself which was started for BV. Has appt on 7/26 wth her GYN  Today she reports continued pain in RLQ. Has shooting pains in RLQ with BMs. Has woken her up at night 9/10 on pain scale.  Feels nausea with eating. No emesis.  No diarrhea, mild constipation.. Has some straining with BMs.  No fever, but chills. She has stopped flagyl given it was making her feel worse.  She denies concern for STD such as Gc/CHlam   She  Is taking lantus 14 ints , jardiance and metformin daily.  Ate chocolate pie before going to ER.  Not checking CBGs recently... FBS 178, 1-2 weeks ago had a number> 400  Blood pressure 118/74, pulse 85, temperature 98.6 F (37 C), temperature source Oral, height 5\' 6"  (1.676 m), weight 278 lb 12 oz (126.4 kg). Social History /Family History/Past Medical History reviewed in detail and updated in EMR if needed.  Review of Systems  Constitutional: Negative for fatigue and fever.  HENT: Negative for congestion.   Eyes: Negative for pain.  Respiratory: Negative for cough and shortness of breath.   Cardiovascular: Negative for chest  pain, palpitations and leg swelling.  Gastrointestinal: Positive for abdominal pain, constipation and nausea. Negative for blood in stool, diarrhea and vomiting.  Genitourinary: Negative for dysuria and vaginal bleeding.  Musculoskeletal: Negative for back pain.  Neurological: Negative for syncope, light-headedness and headaches.  Psychiatric/Behavioral: Negative for dysphoric mood.       Objective:   Physical Exam  Constitutional: Vital signs are normal. She appears well-developed and well-nourished. She is cooperative.  Non-toxic appearance. She does not appear ill. No distress.  HENT:  Head: Normocephalic.  Right Ear: Hearing, tympanic membrane, external ear and ear canal normal.  Left Ear: Hearing, tympanic membrane, external ear and ear canal normal.  Nose: Nose normal.  Eyes: Pupils are equal, round, and reactive to light. Conjunctivae, EOM and lids are normal. Lids are everted and swept, no foreign bodies found.  Neck: Trachea normal and normal range of motion. Neck supple. Carotid bruit is not present. No thyroid mass and no thyromegaly present.  Cardiovascular: Normal rate, regular rhythm, S1 normal, S2 normal, normal heart sounds and intact distal pulses. Exam reveals no gallop.  No murmur heard. Pulmonary/Chest: Effort normal and breath sounds normal. No respiratory distress. She has no wheezes. She has no rhonchi. She has no rales. No breast tenderness, discharge or bleeding.  Abdominal: Soft. Normal appearance and bowel sounds are normal. She exhibits no distension, no fluid wave, no abdominal bruit and no  mass. There is no hepatosplenomegaly. There is tenderness in the right lower quadrant. There is no rigidity, no rebound, no guarding and no CVA tenderness. No hernia.  Genitourinary: Vagina normal and uterus normal. No breast tenderness, discharge or bleeding. Pelvic exam was performed with patient supine. There is no rash, tenderness or lesion on the right labia. There is no  rash, tenderness or lesion on the left labia. Uterus is not enlarged and not tender. Cervix exhibits no motion tenderness, no discharge and no friability. Right adnexum displays no mass, no tenderness and no fullness. Left adnexum displays no mass, no tenderness and no fullness.  Lymphadenopathy:    She has no cervical adenopathy.    She has no axillary adenopathy.  Neurological: She is alert. She has normal strength. No cranial nerve deficit or sensory deficit.  Skin: Skin is warm, dry and intact. No rash noted.  Psychiatric: Her speech is normal and behavior is normal. Judgment normal. Her mood appears not anxious. Cognition and memory are normal. She does not exhibit a depressed mood.          Assessment & Plan:

## 2018-02-13 NOTE — Patient Instructions (Addendum)
Change flagyl to per vaginal. Start Miralax and increase water to move stool through. Given sugar elevated .Marland Kitchen.  Increase Lantus to 16 units daily. Follow blood sugar fasting in morning.. Goal < 120. Can use tramadol for pain as needed.

## 2018-02-16 ENCOUNTER — Ambulatory Visit: Payer: Self-pay | Admitting: Primary Care

## 2018-02-16 NOTE — Telephone Encounter (Addendum)
Noted. Please notify patient that the Tramadol will cause increased constipation so I recommend she avoid it if possible. Continue Miralax daily, add in 100 mg of docusate sodium (Colace) once to twice daily until she's regular. Needs to be drinking at least 64 ounces of water daily.

## 2018-02-16 NOTE — Telephone Encounter (Signed)
Pt called with c/o constipation since last Tuesday. Pt's normal pattern is twice per day. Pt stated she was seen in the ED twice last week and appendicitis was ruled out. Pt was dx with gastritis on one visit and the other visit was dx with BV and was given Flagyl. Urine pregnancy was negative on 02/10/18.  Pt has been taking Miralax once per day since Friday. Last BM on Friday was small and hard and stated the stool hurt as it was coming out.  Pt is c/o nausea and is on a bland diet. Pt is on Tramadol. Pt stated that she is having tight sided abdominal pain. Care advice given but pt stated that [facility] or to an appt because of work hours. Advised pt to begin prune juice, continue to drink plenty of water and to try Metamucil.  Routing note to office. Reason for Disposition . [1] Constant abdominal pain AND [2] present > 2 hours  Answer Assessment - Initial Assessment Questions 1. STOOL PATTERN OR FREQUENCY: "How often do you pass bowel movements (BMs)?"  (Normal range: tid to q 3 days)  "When was the last BM passed?"       Twice a day- Friday small and hard stool on Friday 2. STRAINING: "Do you have to strain to have a BM?"      yes 3. RECTAL PAIN: "Does your rectum hurt when the stool comes out?" If so, ask: "Do you have hemorrhoids? How bad is the pain?"  (Scale 1-10; or mild, moderate, severe)     Yes-no-rectal pain moderate 4. STOOL COMPOSITION: "Are the stools hard?"      yes 5. BLOOD ON STOOLS: "Has there been any blood on the toilet tissue or on the surface of the BM?" If so, ask: "When was the last time?"      no 6. CHRONIC CONSTIPATION: "Is this a new problem for you?"  If no, ask: "How long have you had this problem?" (days, weeks, months)      Yes  7. CHANGES IN DIET: "Have there been any recent changes in your diet?"      no 8. MEDICATIONS: "Have you been taking any new medications?"     Tramadol 9. LAXATIVES: "Have you been using any laxatives or enemas?"   If yes, ask "What, how often, and when was the last time?"     Miralax taking once a day- today and drinking lots of water 10. CAUSE: "What do you think is causing the constipation?"        Pt does not know Pt states that the doctor told her that it may be scar tissue after having surgeries 11. OTHER SYMPTOMS: "Do you have any other symptoms?" (e.g., abdominal pain, fever, vomiting)       Nauseated, right sided abdominal pain occasional chills 12. PREGNANCY: "Is there any chance you are pregnant?" "When was your last menstrual period?"       No LMP: May tested in office and was negative  Protocols used: CONSTIPATION-A-AH

## 2018-02-17 NOTE — Telephone Encounter (Signed)
Spoken and notified patient of Kate Clark's comments. Patient verbalized understanding.  

## 2018-02-24 DIAGNOSIS — Z880 Allergy status to penicillin: Secondary | ICD-10-CM | POA: Diagnosis not present

## 2018-02-24 DIAGNOSIS — K0889 Other specified disorders of teeth and supporting structures: Secondary | ICD-10-CM | POA: Diagnosis not present

## 2018-02-24 DIAGNOSIS — E119 Type 2 diabetes mellitus without complications: Secondary | ICD-10-CM | POA: Diagnosis not present

## 2018-02-24 DIAGNOSIS — Z79899 Other long term (current) drug therapy: Secondary | ICD-10-CM | POA: Diagnosis not present

## 2018-02-27 ENCOUNTER — Encounter: Payer: Self-pay | Admitting: Primary Care

## 2018-02-27 ENCOUNTER — Other Ambulatory Visit: Payer: Self-pay

## 2018-02-27 ENCOUNTER — Emergency Department
Admission: EM | Admit: 2018-02-27 | Discharge: 2018-02-28 | Disposition: A | Discharge status: Home / Self Care | Payer: BLUE CROSS/BLUE SHIELD | Attending: Emergency Medicine | Admitting: Emergency Medicine

## 2018-02-27 ENCOUNTER — Encounter: Payer: Self-pay | Admitting: Emergency Medicine

## 2018-02-27 ENCOUNTER — Ambulatory Visit: Payer: BLUE CROSS/BLUE SHIELD | Admitting: Primary Care

## 2018-02-27 ENCOUNTER — Ambulatory Visit: Payer: Self-pay | Admitting: Primary Care

## 2018-02-27 VITALS — BP 118/82 | HR 92 | Temp 97.8°F | Ht 66.0 in | Wt 274.8 lb

## 2018-02-27 DIAGNOSIS — F339 Major depressive disorder, recurrent, unspecified: Secondary | ICD-10-CM

## 2018-02-27 DIAGNOSIS — E785 Hyperlipidemia, unspecified: Secondary | ICD-10-CM | POA: Diagnosis not present

## 2018-02-27 DIAGNOSIS — I10 Essential (primary) hypertension: Secondary | ICD-10-CM | POA: Diagnosis not present

## 2018-02-27 DIAGNOSIS — Z794 Long term (current) use of insulin: Secondary | ICD-10-CM | POA: Diagnosis not present

## 2018-02-27 DIAGNOSIS — F418 Other specified anxiety disorders: Secondary | ICD-10-CM | POA: Diagnosis not present

## 2018-02-27 DIAGNOSIS — Z79899 Other long term (current) drug therapy: Secondary | ICD-10-CM | POA: Insufficient documentation

## 2018-02-27 DIAGNOSIS — N911 Secondary amenorrhea: Secondary | ICD-10-CM | POA: Diagnosis not present

## 2018-02-27 DIAGNOSIS — F331 Major depressive disorder, recurrent, moderate: Secondary | ICD-10-CM | POA: Diagnosis not present

## 2018-02-27 DIAGNOSIS — E119 Type 2 diabetes mellitus without complications: Secondary | ICD-10-CM | POA: Insufficient documentation

## 2018-02-27 DIAGNOSIS — F4310 Post-traumatic stress disorder, unspecified: Secondary | ICD-10-CM

## 2018-02-27 DIAGNOSIS — F332 Major depressive disorder, recurrent severe without psychotic features: Secondary | ICD-10-CM | POA: Diagnosis not present

## 2018-02-27 DIAGNOSIS — F419 Anxiety disorder, unspecified: Secondary | ICD-10-CM | POA: Insufficient documentation

## 2018-02-27 LAB — COMPREHENSIVE METABOLIC PANEL
ALT: 19 U/L (ref 0–44)
AST: 16 U/L (ref 15–41)
Albumin: 4.4 g/dL (ref 3.5–5.0)
Alkaline Phosphatase: 106 U/L (ref 38–126)
Anion gap: 8 (ref 5–15)
BUN: 18 mg/dL (ref 6–20)
CO2: 27 mmol/L (ref 22–32)
Calcium: 9.4 mg/dL (ref 8.9–10.3)
Chloride: 101 mmol/L (ref 98–111)
Creatinine, Ser: 0.69 mg/dL (ref 0.44–1.00)
GFR calc Af Amer: >60 mL/min (ref >60)
GFR calc non Af Amer: >60 mL/min (ref >60)
Glucose, Bld: 284 mg/dL — ABNORMAL HIGH (ref 70–99)
Potassium: 4.3 mmol/L (ref 3.5–5.1)
Sodium: 136 mmol/L (ref 135–145)
Total Bilirubin: 0.6 mg/dL (ref 0.3–1.2)
Total Protein: 7.8 g/dL (ref 6.5–8.1)

## 2018-02-27 LAB — CBC WITH DIFFERENTIAL/PLATELET
Basophils Absolute: 0 10*3/uL (ref 0–0.1)
Basophils Relative: 1 %
Eosinophils Absolute: 0.2 10*3/uL (ref 0–0.7)
Eosinophils Relative: 3 %
HCT: 40.8 % (ref 35.0–47.0)
Hemoglobin: 14.5 g/dL (ref 12.0–16.0)
Lymphocytes Relative: 30 %
Lymphs Abs: 2 10*3/uL (ref 1.0–3.6)
MCH: 29.5 pg (ref 26.0–34.0)
MCHC: 35.5 g/dL (ref 32.0–36.0)
MCV: 83.1 fL (ref 80.0–100.0)
Monocytes Absolute: 0.3 10*3/uL (ref 0.2–0.9)
Monocytes Relative: 5 %
Neutro Abs: 4.1 10*3/uL (ref 1.4–6.5)
Neutrophils Relative %: 61 %
Platelets: 283 10*3/uL (ref 150–440)
RBC: 4.9 MIL/uL (ref 3.80–5.20)
RDW: 13.3 % (ref 11.5–14.5)
WBC: 6.7 10*3/uL (ref 3.6–11.0)

## 2018-02-27 LAB — URINE DRUG SCREEN, QUALITATIVE (ARMC ONLY)
Amphetamines, Ur Screen: NOT DETECTED
Barbiturates, Ur Screen: NOT DETECTED
Benzodiazepine, Ur Scrn: NOT DETECTED
Cannabinoid 50 Ng, Ur ~~LOC~~: NOT DETECTED
Cocaine Metabolite,Ur ~~LOC~~: NOT DETECTED
MDMA (Ecstasy)Ur Screen: NOT DETECTED
Methadone Scn, Ur: NOT DETECTED
Opiate, Ur Screen: NOT DETECTED
Phencyclidine (PCP) Ur S: NOT DETECTED
Tricyclic, Ur Screen: NOT DETECTED

## 2018-02-27 LAB — URINALYSIS, COMPLETE (UACMP) WITH MICROSCOPIC
Bacteria, UA: NONE SEEN
Bilirubin Urine: NEGATIVE
Glucose, UA: >=500 mg/dL — AB
Hgb urine dipstick: NEGATIVE
Ketones, ur: NEGATIVE mg/dL
Leukocytes, UA: NEGATIVE
Nitrite: NEGATIVE
Protein, ur: NEGATIVE mg/dL
Specific Gravity, Urine: 1.036 — ABNORMAL HIGH (ref 1.005–1.030)
WBC, UA: NONE SEEN WBC/hpf (ref 0–5)
pH: 5 (ref 5.0–8.0)

## 2018-02-27 LAB — POCT PREGNANCY, URINE: Preg Test, Ur: NEGATIVE

## 2018-02-27 LAB — ETHANOL: Alcohol, Ethyl (B): <10 mg/dL (ref <10)

## 2018-02-27 MED ORDER — INSULIN GLARGINE 100 UNIT/ML SOLOSTAR PEN: 16.0000 [IU] | PEN_INJECTOR | Freq: Every evening | SUBCUTANEOUS | 5 refills | Status: DC

## 2018-02-27 NOTE — Patient Instructions (Addendum)
Stop by the lab prior to leaving today. I will notify you of your results once received.   Check your blood sugars 1-2 times daily: Before breakfast  2 hours after a meal Before bedtime  You will be contacted regarding your referral to optometry and psychiatry.  Please let us know if you have not been contacted within one week.   I'll be in touch regarding a follow up appointment.  It was a pleasure to see you today!   Diabetes Mellitus and Nutrition When you have diabetes (diabetes mellitus), it is very important to have healthy eating habits because your blood sugar (glucose) levels are greatly affected by what you eat and drink. Eating healthy foods in the appropriate amounts, at about the same times every day, can help you:  Control your blood glucose.  Lower your risk of heart disease.  Improve your blood pressure.  Reach or maintain a healthy weight.  Every person with diabetes is different, and each person has different needs for a meal plan. Your health care provider may recommend that you work with a diet and nutrition specialist (dietitian) to make a meal plan that is best for you. Your meal plan may vary depending on factors such as:  The calories you need.  The medicines you take.  Your weight.  Your blood glucose, blood pressure, and cholesterol levels.  Your activity level.  Other health conditions you have, such as heart or kidney disease.  How do carbohydrates affect me? Carbohydrates affect your blood glucose level more than any other type of food. Eating carbohydrates naturally increases the amount of glucose in your blood. Carbohydrate counting is a method for keeping track of how many carbohydrates you eat. Counting carbohydrates is important to keep your blood glucose at a healthy level, especially if you use insulin or take certain oral diabetes medicines. It is important to know how many carbohydrates you can safely have in each meal. This is different  for every person. Your dietitian can help you calculate how many carbohydrates you should have at each meal and for snack. Foods that contain carbohydrates include:  Bread, cereal, rice, pasta, and crackers.  Potatoes and corn.  Peas, beans, and lentils.  Milk and yogurt.  Fruit and juice.  Desserts, such as cakes, cookies, ice cream, and candy.  How does alcohol affect me? Alcohol can cause a sudden decrease in blood glucose (hypoglycemia), especially if you use insulin or take certain oral diabetes medicines. Hypoglycemia can be a life-threatening condition. Symptoms of hypoglycemia (sleepiness, dizziness, and confusion) are similar to symptoms of having too much alcohol. If your health care provider says that alcohol is safe for you, follow these guidelines:  Limit alcohol intake to no more than 1 drink per day for nonpregnant women and 2 drinks per day for men. One drink equals 12 oz of beer, 5 oz of wine, or 1 oz of hard liquor.  Do not drink on an empty stomach.  Keep yourself hydrated with water, diet soda, or unsweetened iced tea.  Keep in mind that regular soda, juice, and other mixers may contain a lot of sugar and must be counted as carbohydrates.  What are tips for following this plan? Reading food labels  Start by checking the serving size on the label. The amount of calories, carbohydrates, fats, and other nutrients listed on the label are based on one serving of the food. Many foods contain more than one serving per package.  Check the total grams (g)  of carbohydrates in one serving. You can calculate the number of servings of carbohydrates in one serving by dividing the total carbohydrates by 15. For example, if a food has 30 g of total carbohydrates, it would be equal to 2 servings of carbohydrates.  Check the number of grams (g) of saturated and trans fats in one serving. Choose foods that have low or no amount of these fats.  Check the number of milligrams (mg)  of sodium in one serving. Most people should limit total sodium intake to less than 2,300 mg per day.  Always check the nutrition information of foods labeled as "low-fat" or "nonfat". These foods may be higher in added sugar or refined carbohydrates and should be avoided.  Talk to your dietitian to identify your daily goals for nutrients listed on the label. Shopping  Avoid buying canned, premade, or processed foods. These foods tend to be high in fat, sodium, and added sugar.  Shop around the outside edge of the grocery store. This includes fresh fruits and vegetables, bulk grains, fresh meats, and fresh dairy. Cooking  Use low-heat cooking methods, such as baking, instead of high-heat cooking methods like deep frying.  Cook using healthy oils, such as olive, canola, or sunflower oil.  Avoid cooking with butter, cream, or high-fat meats. Meal planning  Eat meals and snacks regularly, preferably at the same times every day. Avoid going long periods of time without eating.  Eat foods high in fiber, such as fresh fruits, vegetables, beans, and whole grains. Talk to your dietitian about how many servings of carbohydrates you can eat at each meal.  Eat 4-6 ounces of lean protein each day, such as lean meat, chicken, fish, eggs, or tofu. 1 ounce is equal to 1 ounce of meat, chicken, or fish, 1 egg, or 1/4 cup of tofu.  Eat some foods each day that contain healthy fats, such as avocado, nuts, seeds, and fish. Lifestyle   Check your blood glucose regularly.  Exercise at least 30 minutes 5 or more days each week, or as told by your health care provider.  Take medicines as told by your health care provider.  Do not use any products that contain nicotine or tobacco, such as cigarettes and e-cigarettes. If you need help quitting, ask your health care provider.  Work with a Veterinary surgeon or diabetes educator to identify strategies to manage stress and any emotional and social challenges. What  are some questions to ask my health care provider?  Do I need to meet with a diabetes educator?  Do I need to meet with a dietitian?  What number can I call if I have questions?  When are the best times to check my blood glucose? Where to find more information:  American Diabetes Association: diabetes.org/food-and-fitness/food  Academy of Nutrition and Dietetics: https://www.vargas.com/  [employer] of Diabetes and Digestive and Kidney Diseases (NIH): FindJewelers.cz Summary  A healthy meal plan will help you control your blood glucose and maintain a healthy lifestyle.  Working with a diet and nutrition specialist (dietitian) can help you make a meal plan that is best for you.  Keep in mind that carbohydrates and alcohol have immediate effects on your blood glucose levels. It is important to count carbohydrates and to use alcohol carefully. This information is not intended to replace advice given to you by your health care provider. Make sure you discuss any questions you have with your health care provider. Document Released: 04/18/2005 Document Revised: 08/26/2016 Document Reviewed: 08/26/2016 Elsevier  Interactive Patient Education  [employer].

## 2018-02-27 NOTE — Assessment & Plan Note (Signed)
Miss[facility]. [facility]'t feel well [facility] and is wanting to be referred again. Referral placed. She denies SI/HI and states that she does not plan on cutting or harming herself. ED precautions provided.

## 2018-02-27 NOTE — Progress Notes (Signed)
Subjective:    Patient ID: Joan [name], female    DOB: 03-23-1981, 37 y.o.   MRN: [id number]  HPI  Joan [name] is a 37 year old female who presents today for follow up.  1) Type 2 Diabetes:   Current medications include: Jardiance 25 mg, Metformin 1000 mg BID, Lantus 16 units HS.  She is not checking her blood sugars.   Last A1C: 9.8 in April 2019 Last Eye Exam: No recent exam Last Foot Exam: Completed in April 2019 Pneumonia Vaccination: Completed in 2017 ACE/ARB: Losartan  Statin: Atorvastatin   Diet currently consists of:  Breakfast: Protein bar, fruit Lunch: Vegetables, meat Dinner: Vegetables, meat Snacks: Fruit Desserts: Daily (cake, cookies, brownies) Beverages: Some diet soda, water, occasional diet juice  Exercise: she is not exercising   2) Essential Hypertension: Currently managed on losartan 100 mg. She denies chest pain, dizziness, shortness of breath.   BP Readings from Last 3 Encounters:  02/27/18 118/82  02/13/18 118/74  02/12/18 (!) 149/73    3) MDD/PTSD: Currently managed on Latuda 40 mg, Celexa 10 mg, hydroxyzine 50 mg HS. She is following with [facility], doesn't feel like she's well managed. She is needing another referral to be seen with Dr. Shea Evans at [facility] as she missed her initial appointment. She does cut herself sometimes due to increased stress. She has no intent on committing suicide and has no plan.      Review of Systems  Respiratory: Negative for shortness of breath.   Cardiovascular: Negative for chest pain.  Neurological: Negative for dizziness, numbness and headaches.  Psychiatric/Behavioral:       See HPI       Past Medical History:  Diagnosis Date  . Auditory hallucinations   . Diabetes mellitus without complication (HCC)    diet controlled  . Essential hypertension   . Essential hypertension 11/24/2017  . MDD (major depressive disorder)   . Miscarriage   . PTSD (post-traumatic stress disorder)      Social  History   Socioeconomic History  . Marital status: Single    Spouse name: Not on file  . Number of children: Not on file  . Years of education: Not on file  . Highest education level: Not on file  Occupational History  . Not on file  Social Needs  . Financial resource strain: Not on file  . Food insecurity:    Worry: Not on file    Inability: Not on file  . Transportation needs:    Medical: Not on file    Non-medical: Not on file  Tobacco Use  . Smoking status: Never Smoker  . Smokeless tobacco: Never Used  Substance and Sexual Activity  . Alcohol use: No  . Drug use: No  . Sexual activity: Not on file  Lifestyle  . Physical activity:    Days per week: Not on file    Minutes per session: Not on file  . Stress: Not on file  Relationships  . Social connections:    Talks on phone: Not on file    Gets together: Not on file    Attends religious service: Not on file    Active member of club or organization: Not on file    Attends meetings of clubs or organizations: Not on file    Relationship status: Not on file  . Intimate partner violence:    Fear of current or ex partner: Not on file    Emotionally abused: Not on file  Physically abused: Not on file    Forced sexual activity: Not on file  Other Topics Concern  . Not on file  Social History Narrative   Engaged.    Past Surgical History:  Procedure Laterality Date  . ADENOIDECTOMY    . CHOLECYSTECTOMY  2009  . TONSILLECTOMY      Family History  Problem Relation Age of Onset  . Asthma Mother   . Diabetes Mother   . Hyperlipidemia Mother   . Hypertension Mother   . Diabetes Father   . Hyperlipidemia Father   . Hypertension Father   . Diabetes Brother   . Depression Brother   . Alcohol abuse Brother   . Depression Maternal Grandmother   . Stroke Paternal Grandmother     Allergies  Allergen Reactions  . Ciprofloxacin Hives and Rash  . Ibuprofen Swelling    Facial  Other reaction(s): Other, Other  (See Comments) Whole face swelling Facial  Facial    . Penicillins Hives and Rash    Has patient had a PCN reaction causing immediate rash, facial/tongue/throat swelling, SOB or lightheadedness with hypotension: No Has patient had a PCN reaction causing severe rash involving mucus membranes or skin necrosis: No Has patient had a PCN reaction that required hospitalization: No Has patient had a PCN reaction occurring within the last 10 years: No If all of the above answers are "NO", then may proceed with Cephalosporin use.  Other reaction(s): Other (See Comments) Has patient had a PCN reaction causing immediate rash, facial/tongue/throat swelling, SOB or lightheadedness with hypotension: No Has patient had a PCN reaction causing severe rash involving mucus membranes or skin necrosis: No Has patient had a PCN reaction that required hospitalization: No Has patient had a PCN reaction occurring within the last 10 years: No If all of the above answers are "NO", then may proceed with Cephalosporin use. Has patient had a PCN reaction causing immediate rash, facial/tongue/throat swelling, SOB or lightheadedness with hypotension: No Has patient had a PCN reaction causing severe rash involving mucus membranes or skin necrosis: No Has patient had a PCN reaction that required hospitalization: No Has patient had a PCN reaction occurring within the last 10 years: No If all of the above answers are "NO", then may proceed with Cephalosporin use. Has patient had a PCN reaction causing immediate rash, facial/tongue/throat swelling, SOB or lightheadedness with hypotension: No Has patient had a PCN reaction causing severe rash involving mucus membranes or skin necrosis: No Has patient had a PCN reaction that required hospitalization: No Has patient had a PCN reaction occurring within the last 10 years: No If all of the above answers are "NO", then may proceed with Cephalosporin use. Has patient had a PCN  reaction causing immediate rash, facial/tongue/throat swelling, SOB or lightheadedness with hypotension: No Has patient had a PCN reaction causing severe rash involving mucus membranes or skin necrosis: No Has patient had a PCN reaction that required hospitalization: No Has patient had a PCN reaction occurring within the last 10 years: No If all of the above answers are "NO", then may proceed with Cephalosporin use.   . Sulfa Antibiotics Hives and Rash    Current Outpatient Medications on File Prior to Visit  Medication Sig Dispense Refill  . albuterol (PROVENTIL HFA;VENTOLIN HFA) 108 (90 Base) MCG/ACT inhaler Inhale 2 puffs into the lungs every 6 (six) hours as needed for wheezing or shortness of breath. 1 Inhaler 1  . atorvastatin (LIPITOR) 40 MG tablet Take 1 tablet by mouth  at bedtime for cholesterol. 90 tablet 1  . blood glucose meter kit and supplies KIT Dispense based on patient and insurance preference. Use up three times daily as directed. (FOR ICD-9 250.00, 250.01). 1 each 0  . citalopram (CELEXA) 10 MG tablet   1  . empagliflozin (JARDIANCE) 25 MG TABS tablet Take 25 mg by mouth daily. 90 mg 3  . hydrOXYzine (ATARAX/VISTARIL) 50 MG tablet   0  . Insulin Pen Needle (PEN NEEDLES) 31G X 6 MM MISC Use with insulin as directed. 100 each 2  . losartan (COZAAR) 100 MG tablet Take 1 tablet by mouth once daily for blood pressure. 90 tablet 3  . lurasidone (LATUDA) 40 MG TABS tablet Take by mouth.    . metFORMIN (GLUCOPHAGE) 1000 MG tablet Take 1 tablet (1,000 mg total) by mouth 2 (two) times daily with a meal. 180 tablet 3  . traMADol (ULTRAM) 50 MG tablet Take 1 tablet (50 mg total) by mouth every 8 (eight) hours as needed. 15 tablet 0   No current facility-administered medications on file prior to visit.     BP 118/82 (BP Location: Left Arm, Patient Position: Sitting, Cuff Size: Large)   Pulse 92   Temp 97.8 F (36.6 C) (Oral)   Ht '5\' 6"'  (1.676 m)   Wt 274 lb 12 oz (124.6 kg)    LMP  (Within Months) Comment: 12/2017  SpO2 98%   BMI 44.35 kg/m  ' Objective:   Physical Exam  Constitutional: She appears well-nourished.  Neck: Neck supple.  Cardiovascular: Normal rate and regular rhythm.  Respiratory: Effort normal and breath sounds normal.  Skin: Skin is warm and dry.  Healed cut marks to left anterior wrist. No erythema or signs of new marks.  Psychiatric: She has a normal mood and affect.           Assessment & Plan:

## 2018-02-27 NOTE — Assessment & Plan Note (Signed)
Compliant to statin, lipid panel pending.  LFT's from last week WNL.

## 2018-02-27 NOTE — ED Notes (Signed)
All belongings to husband.

## 2018-02-27 NOTE — Assessment & Plan Note (Signed)
Stable in the office today, continue losartan 100 mg.

## 2018-02-27 NOTE — Assessment & Plan Note (Signed)
Repeat A1C pending. Foot exam UTD. Referral placed to optometry for diabetic eye exam. Managed on statin and ARB. Pneumonia vaccination UTD.  Follow up in 3 or 6 months based off of A1C. Continue current regimen for now.

## 2018-02-27 NOTE — ED Notes (Signed)
Gave pt diet ginger ale.

## 2018-02-27 NOTE — Assessment & Plan Note (Signed)
[facility], doesn't feel well managed. Referral [facility] back [facility].

## 2018-02-27 NOTE — ED Notes (Signed)
SOC machine set up in patients room. 

## 2018-02-27 NOTE — ED Notes (Signed)
Pt. Finished SOC, pt. Given phone so she could give husband update.  Patients husband is in the waiting room.

## 2018-02-27 NOTE — ED Notes (Signed)
SOC report given.  SOC machine set up in patients room.

## 2018-02-27 NOTE — ED Notes (Signed)
TTS talking to pt. At time.

## 2018-02-27 NOTE — ED Provider Notes (Addendum)
[facility] Emergency Department Provider Note  ____________________________________________   I have reviewed the triage vital signs and the nursing notes. Where available I have reviewed prior notes and, if possible and indicated, outside hospital notes.    HISTORY  Chief Complaint Anxiety    HPI Joan [name] is a 37 y.o. female  With a history of major depressive disorder, PTSD, anxiety, states she is been very anxious recently she is on [place] and Celexa and she is taking this medication.  She has no SI or HI but she states she has had a lot of time to breathe over her medical and other problems.  Patient recently had a miscarriage, also had a death in her family in 2023-07-17 which is been very depressing to her.  She does not feel that she will harm herself or anyone else but she needs help with her depression and anxiety.      Past Medical History:  Diagnosis Date  . Auditory hallucinations   . Diabetes mellitus without complication (HCC)    diet controlled  . Essential hypertension   . Essential hypertension 11/24/2017  . MDD (major depressive disorder)   . Miscarriage   . PTSD (post-traumatic stress disorder)     Patient Active Problem List   Diagnosis Date Noted  . RLQ abdominal pain 02/13/2018  . BV (bacterial vaginosis) 02/13/2018  . Type 2 diabetes mellitus without complication, without long-term current use of insulin ([place]) 11/24/2017  . Essential hypertension 11/24/2017  . Hyperlipidemia 11/24/2017  . Major depressive disorder, recurrent, severe without psychotic features ([place])   . PTSD (post-traumatic stress disorder) 04/11/2015    Past Surgical History:  Procedure Laterality Date  . ADENOIDECTOMY    . CHOLECYSTECTOMY  2009  . TONSILLECTOMY      Prior to Admission medications   Medication Sig Start Date End Date Taking? Authorizing Provider  albuterol (PROVENTIL HFA;VENTOLIN HFA) 108 (90 Base) MCG/ACT inhaler Inhale 2  puffs into the lungs every 6 (six) hours as needed for wheezing or shortness of breath. 12/15/17   Elby Beck, FNP  atorvastatin (LIPITOR) 40 MG tablet Take 1 tablet by mouth at bedtime for cholesterol. 11/26/17   Pleas Koch, NP  blood glucose meter kit and supplies KIT Dispense based on patient and insurance preference. Use up three times daily as directed. (FOR ICD-9 250.00, 250.01). 11/24/17   Pleas Koch, NP  citalopram (CELEXA) 10 MG tablet  11/28/17   [provider]  empagliflozin (JARDIANCE) 25 MG TABS tablet Take 25 mg by mouth daily. 12/18/17   Pleas Koch, NP  hydrOXYzine (ATARAX/VISTARIL) 50 MG tablet  11/28/17   [provider]  Insulin Glargine (LANTUS) 100 UNIT/ML Solostar Pen Inject 16 Units into the skin every evening. 02/27/18   Pleas Koch, NP  Insulin Pen Needle (PEN NEEDLES) 31G X 6 MM MISC Use with insulin as directed. 11/25/17   Pleas Koch, NP  losartan (COZAAR) 100 MG tablet Take 1 tablet by mouth once daily for blood pressure. 12/18/17   Pleas Koch, NP  lurasidone (LATUDA) 40 MG TABS tablet Take by mouth.    [provider]  metFORMIN (GLUCOPHAGE) 1000 MG tablet Take 1 tablet (1,000 mg total) by mouth 2 (two) times daily with a meal. 12/18/17   Pleas Koch, NP  traMADol (ULTRAM) 50 MG tablet Take 1 tablet (50 mg total) by mouth every 8 (eight) hours as needed. 02/13/18   Jinny Sanders, MD  Allergies Ciprofloxacin; Ibuprofen; Penicillins; and Sulfa antibiotics  Family History  Problem Relation Age of Onset  . Asthma Mother   . Diabetes Mother   . Hyperlipidemia Mother   . Hypertension Mother   . Diabetes Father   . Hyperlipidemia Father   . Hypertension Father   . Diabetes Brother   . Depression Brother   . Alcohol abuse Brother   . Depression Maternal Grandmother   . Stroke Paternal Grandmother     Social History Social History   Tobacco Use  . Smoking status: Never Smoker  .  Smokeless tobacco: Never Used  Substance Use Topics  . Alcohol use: No  . Drug use: No    Review of Systems Constitutional: No fever/chills Eyes: No visual changes. ENT: No sore throat. No stiff neck no neck pain Cardiovascular: Denies chest pain. Respiratory: Denies shortness of breath. Gastrointestinal:   no vomiting.  No diarrhea.  No constipation. Genitourinary: Negative for dysuria. Musculoskeletal: Negative lower extremity swelling Skin: Negative for rash. Neurological: Negative for severe headaches, focal weakness or numbness.   ____________________________________________   PHYSICAL EXAM:  VITAL SIGNS: ED Triage Vitals  Enc Vitals Group     BP 02/27/18 2040 (!) 128/100     Pulse Rate 02/27/18 2040 99     Resp 02/27/18 2040 18     Temp 02/27/18 2040 98.3 F (36.8 C)     Temp Source 02/27/18 2040 Oral     SpO2 02/27/18 2040 99 %     Weight 02/27/18 2040 269 lb (122 kg)     Height 02/27/18 2040 '5\' 6"'  (1.676 m)     Head Circumference --      Peak Flow --      Pain Score 02/27/18 2053 10     Pain Loc --      Pain Edu? --      Excl. in [place]? --     Constitutional: Alert and oriented. Well appearing and in no acute distress. Eyes: Conjunctivae are normal Head: Atraumatic HEENT: No congestion/rhinnorhea. Mucous membranes are moist.  Oropharynx non-erythematous Neck:   Nontender with no meningismus, no masses, no stridor Cardiovascular: Normal rate, regular rhythm. Grossly normal heart sounds.  Good peripheral circulation. Respiratory: Normal respiratory effort.  No retractions. Lungs CTAB. Abdominal: Soft and nontender. No distention. No guarding no rebound Back:  There is no focal tenderness or step off.  there is no midline tenderness there are no lesions noted. there is no CVA tenderness Musculoskeletal: No lower extremity tenderness, no upper extremity tenderness. No joint effusions, no DVT signs strong distal pulses no edema Neurologic:  Normal speech and  language. No gross focal neurologic deficits are appreciated.  Skin:  Skin is warm, dry and intact. No rash noted. Psychiatric: Has a somewhat flat mood and affect.  ____________________________________________   LABS (all labs ordered are listed, but only abnormal results are displayed)  Labs Reviewed  COMPREHENSIVE METABOLIC PANEL - Abnormal; Notable for the following components:      Result Value   Glucose, Bld 284 (*)    All other components within normal limits  URINALYSIS, COMPLETE (UACMP) WITH MICROSCOPIC - Abnormal; Notable for the following components:   Color, Urine STRAW (*)    APPearance CLEAR (*)    Specific Gravity, Urine 1.036 (*)    Glucose, UA >=500 (*)    All other components within normal limits  CBC WITH DIFFERENTIAL/PLATELET  ETHANOL  URINE DRUG SCREEN, QUALITATIVE ([facility] ONLY)  POCT PREGNANCY, URINE  POC URINE  PREG, ED    Pertinent labs  results that were available during my care of the patient were reviewed by me and considered in my medical decision making (see chart for details). ____________________________________________  EKG  I personally interpreted any EKGs ordered by me or triage  ____________________________________________  RADIOLOGY  Pertinent labs & imaging results that were available during my care of the patient were reviewed by me and considered in my medical decision making (see chart for details). If possible, patient and/or family made aware of any abnormal findings.  No results found. ____________________________________________    PROCEDURES  Procedure(s) performed: None  Procedures  Critical Care performed: None  ____________________________________________   INITIAL IMPRESSION / ASSESSMENT AND PLAN / ED COURSE  Pertinent labs & imaging results that were available during my care of the patient were reviewed by me and considered in my medical decision making (see chart for details).  Patient here with anxiety and  depression, no SI or HI however she does want to talk to her psychiatrist about her symptoms.  We will see if we can avoid her, no evidence of toxidrome or overdose.  ----------------------------------------- 12:10 AM on 02/28/2018 -----------------------------------------  Seen by psychiatry they do not recommend inpatient, they do recommend outpatient and follow-up with [facility] which we have advised.  They want to increase his Celexa to 20 mg daily which we will do, continuing the other medication without change.  Patient states she has no SI or HI we will discharge, contracts for safety   ____________________________________________   FINAL CLINICAL IMPRESSION(S) / ED DIAGNOSES  Final diagnoses:  None      This chart was dictated using voice recognition software.  Despite best efforts to proofread,  errors can occur which can change meaning.      Schuyler Amor, MD 02/27/18 2145    Schuyler Amor, MD 02/28/18 [phone]

## 2018-02-27 NOTE — ED Triage Notes (Signed)
States increased anxiety for about 2 weeks. States has had anxiety and depression since December when lost dad and dear friend. Then had miscarriage in April. Feels these situations have contributed to anxiety and depression. Denies suicidal ideation. States had suicidal ideation with plan but not attempt last august but not since. States has been on medication but feels these need to be adjusted.

## 2018-02-28 LAB — LIPID PANEL
Cholesterol: 287 mg/dL — ABNORMAL HIGH (ref <200)
HDL: 48 mg/dL — ABNORMAL LOW (ref >50)
Non-HDL Cholesterol (Calc): 239 mg/dL (calc) — ABNORMAL HIGH (ref <130)
Total CHOL/HDL Ratio: 6 (calc) — ABNORMAL HIGH (ref <5.0)
Triglycerides: 581 mg/dL — ABNORMAL HIGH (ref <150)

## 2018-02-28 LAB — HEMOGLOBIN A1C
Hgb A1c MFr Bld: 10.1 % of total Hgb — ABNORMAL HIGH (ref <5.7)
Mean Plasma Glucose: 243 (calc)
eAG (mmol/L): 13.5 (calc)

## 2018-02-28 MED ORDER — CITALOPRAM HYDROBROMIDE 20 MG PO TABS: 20.0000 mg | ORAL_TABLET | Freq: Every day | ORAL | 0 refills | Status: DC

## 2018-02-28 NOTE — Discharge Instructions (Addendum)
We are increasing her Celexa after talking the psychiatrist to 20 mg a day, return to the emergency room for any new or worrisome symptoms including thoughts of hurting yourself.

## 2018-02-28 NOTE — ED Notes (Signed)
Pt. Going home with husband. 

## 2018-03-02 ENCOUNTER — Other Ambulatory Visit: Payer: Self-pay | Admitting: Primary Care

## 2018-03-02 DIAGNOSIS — E119 Type 2 diabetes mellitus without complications: Secondary | ICD-10-CM

## 2018-03-02 MED ORDER — INSULIN GLARGINE 100 UNIT/ML SOLOSTAR PEN: 20.0000 [IU] | PEN_INJECTOR | Freq: Every evening | SUBCUTANEOUS | 5 refills | Status: DC

## 2018-03-10 DIAGNOSIS — J40 Bronchitis, not specified as acute or chronic: Secondary | ICD-10-CM | POA: Diagnosis not present

## 2018-03-10 DIAGNOSIS — J0100 Acute maxillary sinusitis, unspecified: Secondary | ICD-10-CM | POA: Diagnosis not present

## 2018-03-10 DIAGNOSIS — R6889 Other general symptoms and signs: Secondary | ICD-10-CM | POA: Diagnosis not present

## 2018-03-10 DIAGNOSIS — R05 Cough: Secondary | ICD-10-CM | POA: Diagnosis not present

## 2018-03-12 ENCOUNTER — Ambulatory Visit: Payer: BLUE CROSS/BLUE SHIELD | Admitting: Internal Medicine

## 2018-03-12 ENCOUNTER — Encounter: Payer: Self-pay | Admitting: Internal Medicine

## 2018-03-12 VITALS — BP 120/80 | HR 90 | Temp 98.5°F | Wt 279.0 lb

## 2018-03-12 DIAGNOSIS — J209 Acute bronchitis, unspecified: Secondary | ICD-10-CM | POA: Diagnosis not present

## 2018-03-12 MED ORDER — PREDNISONE 10 MG PO TABS: ORAL_TABLET | ORAL | 0 refills | Status: DC

## 2018-03-12 MED ORDER — METHYLPREDNISOLONE ACETATE 80 MG/ML IJ SUSP
80.0000 mg | Freq: Once | INTRAMUSCULAR | Status: AC
  Administered 2018-03-12: 80 mg via INTRAMUSCULAR

## 2018-03-12 NOTE — Addendum Note (Signed)
Addended by: Roena MaladyEVONTENNO, Juel Ripley Y on: 03/12/2018 02:14 PM   Modules accepted: Orders

## 2018-03-12 NOTE — Progress Notes (Signed)
HPI  Pt presents to the clinic today with c/o ear pain, nasal congestion and cough. She reports this started 1 week ago. She describes the ear pain as achy, but denies drainage or loss of hearing. She is not blowing anything out of her nose. The cough is non productive. She is mildly short of breath. She denies fever or body aches but has had chills. She went to UC 2 days ago for the same. She was prescribed Flonase, Codeine cough syrup and Doxycycline, which she has been taking as prescribed, however she reports she feels worse. She has not taken anything OTC. She has not had sick contacts. She denies history of allergies.   Review of Systems      Past Medical History:  Diagnosis Date  . Auditory hallucinations   . Diabetes mellitus without complication (HCC)    diet controlled  . Essential hypertension   . Essential hypertension 11/24/2017  . MDD (major depressive disorder)   . Miscarriage   . PTSD (post-traumatic stress disorder)     Family History  Problem Relation Age of Onset  . Asthma Mother   . Diabetes Mother   . Hyperlipidemia Mother   . Hypertension Mother   . Diabetes Father   . Hyperlipidemia Father   . Hypertension Father   . Diabetes Brother   . Depression Brother   . Alcohol abuse Brother   . Depression Maternal Grandmother   . Stroke Paternal Grandmother     Social History   Socioeconomic History  . Marital status: Single    Spouse name: Not on file  . Number of children: Not on file  . Years of education: Not on file  . Highest education level: Not on file  Occupational History  . Not on file  Social Needs  . Financial resource strain: Not on file  . Food insecurity:    Worry: Not on file    Inability: Not on file  . Transportation needs:    Medical: Not on file    Non-medical: Not on file  Tobacco Use  . Smoking status: Never Smoker  . Smokeless tobacco: Never Used  Substance and Sexual Activity  . Alcohol use: No  . Drug use: No  . Sexual  activity: Not on file  Lifestyle  . Physical activity:    Days per week: Not on file    Minutes per session: Not on file  . Stress: Not on file  Relationships  . Social connections:    Talks on phone: Not on file    Gets together: Not on file    Attends religious service: Not on file    Active member of club or organization: Not on file    Attends meetings of clubs or organizations: Not on file    Relationship status: Not on file  . Intimate partner violence:    Fear of current or ex partner: Not on file    Emotionally abused: Not on file    Physically abused: Not on file    Forced sexual activity: Not on file  Other Topics Concern  . Not on file  Social History Narrative   Engaged.    Allergies  Allergen Reactions  . Ciprofloxacin Hives and Rash  . Ibuprofen Swelling    Facial  Other reaction(s): Other, Other (See Comments) Whole face swelling Facial  Facial    . Penicillins Hives and Rash    Has patient had a PCN reaction causing immediate rash, facial/tongue/throat swelling, SOB or  lightheadedness with hypotension: No Has patient had a PCN reaction causing severe rash involving mucus membranes or skin necrosis: No Has patient had a PCN reaction that required hospitalization: No Has patient had a PCN reaction occurring within the last 10 years: No If all of the above answers are "NO", then may proceed with Cephalosporin use.  Other reaction(s): Other (See Comments) Has patient had a PCN reaction causing immediate rash, facial/tongue/throat swelling, SOB or lightheadedness with hypotension: No Has patient had a PCN reaction causing severe rash involving mucus membranes or skin necrosis: No Has patient had a PCN reaction that required hospitalization: No Has patient had a PCN reaction occurring within the last 10 years: No If all of the above answers are "NO", then may proceed with Cephalosporin use. Has patient had a PCN reaction causing immediate rash,  facial/tongue/throat swelling, SOB or lightheadedness with hypotension: No Has patient had a PCN reaction causing severe rash involving mucus membranes or skin necrosis: No Has patient had a PCN reaction that required hospitalization: No Has patient had a PCN reaction occurring within the last 10 years: No If all of the above answers are "NO", then may proceed with Cephalosporin use. Has patient had a PCN reaction causing immediate rash, facial/tongue/throat swelling, SOB or lightheadedness with hypotension: No Has patient had a PCN reaction causing severe rash involving mucus membranes or skin necrosis: No Has patient had a PCN reaction that required hospitalization: No Has patient had a PCN reaction occurring within the last 10 years: No If all of the above answers are "NO", then may proceed with Cephalosporin use. Has patient had a PCN reaction causing immediate rash, facial/tongue/throat swelling, SOB or lightheadedness with hypotension: No Has patient had a PCN reaction causing severe rash involving mucus membranes or skin necrosis: No Has patient had a PCN reaction that required hospitalization: No Has patient had a PCN reaction occurring within the last 10 years: No If all of the above answers are "NO", then may proceed with Cephalosporin use.   . Sulfa Antibiotics Hives and Rash     Constitutional: Pt reports chills. Denies headache, fatigue, fever or abrupt weight changes.  HEENT:  Positive ear pain, nasal congestion. Denies eye redness, eye pain, pressure behind the eyes, facial pain, ear pain, ringing in the ears, wax buildup, runny nose or sore throat. Respiratory: Positive cough and shortness of breath. Denies difficulty breathing.  Cardiovascular: Denies chest pain, chest tightness, palpitations or swelling in the hands or feet.   No other specific complaints in a complete review of systems (except as listed in HPI above).  Objective:   BP 120/80   Pulse 90   Temp 98.5 F  (36.9 C) (Oral)   Wt 279 lb (126.6 kg)   SpO2 98%   BMI 45.03 kg/m  Wt Readings from Last 3 Encounters:  03/12/18 279 lb (126.6 kg)  02/27/18 269 lb (122 kg)  02/27/18 274 lb 12 oz (124.6 kg)     General: Appears her stated age, in NAD. HEENT: Head: normal shape and size, no sinus tenderness noted; Right Ear: T tube noted, canal clear. Left Ear: TM gray and intact, normal light reflex, + serous effusion noted; Nose: mucosa pink and moist, septum midline; Throat/Mouth: + PND. Teeth present, mucosa erythematous and moist, no exudate noted, no lesions or ulcerations noted.  Neck: No cervical lymphadenopathy.  Cardiovascular: Normal rate and rhythm. S1,S2 noted.  No murmur, rubs or gallops noted.  Pulmonary/Chest: Normal effort with scattered rhonchi throughout. No respiratory  distress. No wheezes, rales noted.       Assessment & Plan:   Acute Bronchitis, Unresolved:  Get some rest and drink plenty of water Continue Flonase, Doxycyline and Codeine cough syrup 80 mg Depo IM today eRx for Pred taper x 6 days- monitor sugars  RTC as needed or if symptoms persist.   Nicki Reaper, NP

## 2018-03-12 NOTE — Patient Instructions (Signed)

## 2018-03-18 DIAGNOSIS — Z3200 Encounter for pregnancy test, result unknown: Secondary | ICD-10-CM | POA: Diagnosis not present

## 2018-03-19 DIAGNOSIS — S59912A Unspecified injury of left forearm, initial encounter: Secondary | ICD-10-CM | POA: Diagnosis not present

## 2018-03-19 DIAGNOSIS — M79632 Pain in left forearm: Secondary | ICD-10-CM | POA: Diagnosis not present

## 2018-03-19 DIAGNOSIS — W010XXA Fall on same level from slipping, tripping and stumbling without subsequent striking against object, initial encounter: Secondary | ICD-10-CM | POA: Diagnosis not present

## 2018-03-19 DIAGNOSIS — S63501A Unspecified sprain of right wrist, initial encounter: Secondary | ICD-10-CM | POA: Diagnosis not present

## 2018-03-27 DIAGNOSIS — J209 Acute bronchitis, unspecified: Secondary | ICD-10-CM | POA: Diagnosis not present

## 2018-03-27 DIAGNOSIS — J019 Acute sinusitis, unspecified: Secondary | ICD-10-CM | POA: Diagnosis not present

## 2018-03-27 DIAGNOSIS — J9801 Acute bronchospasm: Secondary | ICD-10-CM | POA: Diagnosis not present

## 2018-03-27 DIAGNOSIS — R509 Fever, unspecified: Secondary | ICD-10-CM | POA: Diagnosis not present

## 2018-03-27 DIAGNOSIS — R05 Cough: Secondary | ICD-10-CM | POA: Diagnosis not present

## 2018-03-30 ENCOUNTER — Encounter: Payer: Self-pay | Admitting: Family Medicine

## 2018-03-30 ENCOUNTER — Ambulatory Visit (INDEPENDENT_AMBULATORY_CARE_PROVIDER_SITE_OTHER): Payer: BLUE CROSS/BLUE SHIELD | Admitting: Family Medicine

## 2018-03-30 VITALS — BP 110/80 | HR 107 | Temp 98.3°F | Ht 66.0 in | Wt 271.5 lb

## 2018-03-30 DIAGNOSIS — E119 Type 2 diabetes mellitus without complications: Secondary | ICD-10-CM

## 2018-03-30 DIAGNOSIS — F4310 Post-traumatic stress disorder, unspecified: Secondary | ICD-10-CM

## 2018-03-30 DIAGNOSIS — F332 Major depressive disorder, recurrent severe without psychotic features: Secondary | ICD-10-CM | POA: Diagnosis not present

## 2018-03-30 DIAGNOSIS — J189 Pneumonia, unspecified organism: Secondary | ICD-10-CM

## 2018-03-30 DIAGNOSIS — Z889 Allergy status to unspecified drugs, medicaments and biological substances status: Secondary | ICD-10-CM | POA: Diagnosis not present

## 2018-03-30 MED ORDER — CEFDINIR 300 MG PO CAPS: 600.0000 mg | ORAL_CAPSULE | Freq: Every day | ORAL | 0 refills | Status: DC

## 2018-03-30 MED ORDER — AEROCHAMBER PLUS MISC: 2 refills | Status: DC

## 2018-03-30 NOTE — Patient Instructions (Signed)
Talk to them about Psychiatry referral:    REFERRALS TO SPECIALISTS, SPECIAL TESTS (MRI, CT, ULTRASOUNDS)  [facility] or  [facility] will help you. ASK CHECK-IN FOR HELP.  Specialist appointment times vary a great deal, based on their schedule / openings. -- Some specialists have very long wait times. (Example. Dermatology)

## 2018-03-30 NOTE — Progress Notes (Signed)
Dr. Frederico Hamman T. Margarita Croke, MD, [facility] [place], [address] Phone: [phone] Fax: [phone]  03/30/2018  Patient: Joan [name], MRN: [id number], DOB: July 25, 1981, 37 y.o.  Primary Physician:  Pleas Koch, NP   Chief Complaint  Patient presents with  . Shortness of Breath    seen at [facility] on Friday  . Chest Pain  . Cough  . Nasal Congestion  . Chills   Subjective:   Joan [name] is a 37 y.o. very pleasant female patient who presents with the following:  This marks her 5th OV since 03/10/2018. Chart from [facility] reviewed.   Multiple UC and OV, doxy, prednisone.  Zpak. Albuterol.  No known pulmonary disease. Non-smoker. Chest x-ray yesterday, results unavailable.   She also has multiple drug allergies including ciprofloxacin as well as penicillin.  She also has diabetes and her blood sugars been running 400-500 while she has been on prednisone.  She is coughing, globally not sleeping all that well, she does not feel very well.  She also has significant depression as well as PTSD.  She is had a referral made to behavioral health, but it looks like this was actually canceled on their end.  Past Medical History, Surgical History, Social History, Family History, Problem List, Medications, and Allergies have been reviewed and updated if relevant.  Patient Active Problem List   Diagnosis Date Noted  . RLQ abdominal pain 02/13/2018  . BV (bacterial vaginosis) 02/13/2018  . Type 2 diabetes mellitus without complication, without long-term current use of insulin ([place]) 11/24/2017  . Essential hypertension 11/24/2017  . Hyperlipidemia 11/24/2017  . Major depressive disorder, recurrent, severe without psychotic features ([place])   . PTSD (post-traumatic stress disorder) 04/11/2015    Past Medical History:  Diagnosis Date  . Auditory hallucinations   . Diabetes mellitus without complication  (HCC)    diet controlled  . Essential hypertension   . Essential hypertension 11/24/2017  . MDD (major depressive disorder)   . Miscarriage   . PTSD (post-traumatic stress disorder)     Past Surgical History:  Procedure Laterality Date  . ADENOIDECTOMY    . CHOLECYSTECTOMY  2009  . TONSILLECTOMY      Social History   Socioeconomic History  . Marital status: Single    Spouse name: Not on file  . Number of children: Not on file  . Years of education: Not on file  . Highest education level: Not on file  Occupational History  . Not on file  Social Needs  . Financial resource strain: Not on file  . Food insecurity:    Worry: Not on file    Inability: Not on file  . Transportation needs:    Medical: Not on file    Non-medical: Not on file  Tobacco Use  . Smoking status: Never Smoker  . Smokeless tobacco: Never Used  Substance and Sexual Activity  . Alcohol use: No  . Drug use: No  . Sexual activity: Not on file  Lifestyle  . Physical activity:    Days per week: Not on file    Minutes per session: Not on file  . Stress: Not on file  Relationships  . Social connections:    Talks on phone: Not on file    Gets together: Not on file    Attends religious service: Not on file    Active member of club or organization: Not on file  Attends meetings of clubs or organizations: Not on file    Relationship status: Not on file  . Intimate partner violence:    Fear of current or ex partner: Not on file    Emotionally abused: Not on file    Physically abused: Not on file    Forced sexual activity: Not on file  Other Topics Concern  . Not on file  Social History Narrative   Engaged.    Family History  Problem Relation Age of Onset  . Asthma Mother   . Diabetes Mother   . Hyperlipidemia Mother   . Hypertension Mother   . Diabetes Father   . Hyperlipidemia Father   . Hypertension Father   . Diabetes Brother   . Depression Brother   . Alcohol abuse Brother   .  Depression Maternal Grandmother   . Stroke Paternal Grandmother     Allergies  Allergen Reactions  . Ciprofloxacin Hives and Rash  . Ibuprofen Swelling    Facial  Other reaction(s): Other, Other (See Comments) Whole face swelling Facial  Facial    . Penicillins Hives and Rash    Has patient had a PCN reaction causing immediate rash, facial/tongue/throat swelling, SOB or lightheadedness with hypotension: No Has patient had a PCN reaction causing severe rash involving mucus membranes or skin necrosis: No Has patient had a PCN reaction that required hospitalization: No Has patient had a PCN reaction occurring within the last 10 years: No If all of the above answers are "NO", then may proceed with Cephalosporin use.  Other reaction(s): Other (See Comments) Has patient had a PCN reaction causing immediate rash, facial/tongue/throat swelling, SOB or lightheadedness with hypotension: No Has patient had a PCN reaction causing severe rash involving mucus membranes or skin necrosis: No Has patient had a PCN reaction that required hospitalization: No Has patient had a PCN reaction occurring within the last 10 years: No If all of the above answers are "NO", then may proceed with Cephalosporin use. Has patient had a PCN reaction causing immediate rash, facial/tongue/throat swelling, SOB or lightheadedness with hypotension: No Has patient had a PCN reaction causing severe rash involving mucus membranes or skin necrosis: No Has patient had a PCN reaction that required hospitalization: No Has patient had a PCN reaction occurring within the last 10 years: No If all of the above answers are "NO", then may proceed with Cephalosporin use. Has patient had a PCN reaction causing immediate rash, facial/tongue/throat swelling, SOB or lightheadedness with hypotension: No Has patient had a PCN reaction causing severe rash involving mucus membranes or skin necrosis: No Has patient had a PCN reaction that  required hospitalization: No Has patient had a PCN reaction occurring within the last 10 years: No If all of the above answers are "NO", then may proceed with Cephalosporin use. Has patient had a PCN reaction causing immediate rash, facial/tongue/throat swelling, SOB or lightheadedness with hypotension: No Has patient had a PCN reaction causing severe rash involving mucus membranes or skin necrosis: No Has patient had a PCN reaction that required hospitalization: No Has patient had a PCN reaction occurring within the last 10 years: No If all of the above answers are "NO", then may proceed with Cephalosporin use.   . Sulfa Antibiotics Hives and Rash    Medication list reviewed and updated in full in [place].  ROS: GEN: Acute illness details above GI: Tolerating PO intake GU: maintaining adequate hydration and urination Pulm: No SOB Interactive and getting along well at  home.  Otherwise, ROS is as per the HPI.  Objective:   BP 110/80   Pulse (!) 107   Temp 98.3 F (36.8 C) (Oral)   Ht _0  (1.676 m)   Wt 271 lb 8 oz (123.2 kg)   SpO2 97%   BMI 43.82 kg/m    GEN: A and O x 3. WDWN. NAD.    ENT: Nose clear, ext NML.  No LAD.  No JVD.  TM's clear. Oropharynx clear.  PULM: Normal WOB, no distress. No crackles, wheezes, scattered rhonchi. CV: RRR, no M/G/R, No rubs, No JVD.   EXT: warm and well-perfused, No c/c/e. PSYCH: Pleasant and conversant.    Laboratory and Imaging Data:  Assessment and Plan:   Community acquired pneumonia, unspecified laterality  Multiple allergies  Major depressive disorder, recurrent, severe without psychotic features ([place])  PTSD (post-traumatic stress disorder)  Type 2 diabetes mellitus without complication, without long-term current use of insulin (HCC)  Stop steroids.  Stop Zithromax.  Multiple allergies.  Given this clinical setting in a patient who has had 5 office visits and is having poor clinical status, I am going to try  the patient on some Omnicef, which has a very rare risk of systemic allergy with prior possible penicillin allergy.  She knows to stop this if she has any kind of rash.  Going to give her a spacer to use with her albuterol.  Also had her go talk to our clinical care coordinators to help assist her get up with psychiatry for her follow-up which was unfortunately canceled by psychiatry itself.  Patient Instructions  Talk to them about Psychiatry referral:    REFERRALS TO SPECIALISTS, SPECIAL TESTS (MRI, CT, ULTRASOUNDS)  [facility] or  [facility] will help you. ASK CHECK-IN FOR HELP.  Specialist appointment times vary a great deal, based on their schedule / openings. -- Some specialists have very long wait times. (Example. Dermatology)       Meds ordered this encounter  Medications  . cefdinir (OMNICEF) 300 MG capsule    Sig: Take 2 capsules (600 mg total) by mouth daily.    Dispense:  20 capsule    Refill:  0  . Spacer/Aero-Holding Chambers (AEROCHAMBER PLUS) inhaler    Sig: Use as instructed. Generic spacer if possible    Dispense:  1 each    Refill:  2    Signed,  Adolphe Fortunato T. Skylinn Vialpando, MD   Allergies as of 03/30/2018      Reactions   Ciprofloxacin Hives, Rash   Ibuprofen Swelling   Facial  Other reaction(s): Other, Other (See Comments) Whole face swelling Facial  Facial    Penicillins Hives, Rash   Has patient had a PCN reaction causing immediate rash, facial/tongue/throat swelling, SOB or lightheadedness with hypotension: No Has patient had a PCN reaction causing severe rash involving mucus membranes or skin necrosis: No Has patient had a PCN reaction that required hospitalization: No Has patient had a PCN reaction occurring within the last 10 years: No If all of the above answers are "NO", then may proceed with Cephalosporin use. Other reaction(s): Other (See Comments) Has patient had a PCN reaction causing immediate rash, facial/tongue/throat swelling, SOB or  lightheadedness with hypotension: No Has patient had a PCN reaction causing severe rash involving mucus membranes or skin necrosis: No Has patient had a PCN reaction that required hospitalization: No Has patient had a PCN reaction occurring within the last 10 years: No If all of the above answers are "NO", then  may proceed with Cephalosporin use. Has patient had a PCN reaction causing immediate rash, facial/tongue/throat swelling, SOB or lightheadedness with hypotension: No Has patient had a PCN reaction causing severe rash involving mucus membranes or skin necrosis: No Has patient had a PCN reaction that required hospitalization: No Has patient had a PCN reaction occurring within the last 10 years: No If all of the above answers are "NO", then may proceed with Cephalosporin use. Has patient had a PCN reaction causing immediate rash, facial/tongue/throat swelling, SOB or lightheadedness with hypotension: No Has patient had a PCN reaction causing severe rash involving mucus membranes or skin necrosis: No Has patient had a PCN reaction that required hospitalization: No Has patient had a PCN reaction occurring within the last 10 years: No If all of the above answers are "NO", then may proceed with Cephalosporin use. Has patient had a PCN reaction causing immediate rash, facial/tongue/throat swelling, SOB or lightheadedness with hypotension: No Has patient had a PCN reaction causing severe rash involving mucus membranes or skin necrosis: No Has patient had a PCN reaction that required hospitalization: No Has patient had a PCN reaction occurring within the last 10 years: No If all of the above answers are "NO", then may proceed with Cephalosporin use.   Sulfa Antibiotics Hives, Rash      Medication List        Accurate as of 03/30/18  2:38 PM. Always use your most recent med list.          AEROCHAMBER PLUS inhaler Use as instructed. Generic spacer if possible   albuterol 108 (90 Base)  MCG/ACT inhaler Commonly known as:  PROVENTIL HFA;VENTOLIN HFA Inhale 2 puffs into the lungs every 6 (six) hours as needed for wheezing or shortness of breath.   atorvastatin 40 MG tablet Commonly known as:  LIPITOR Take 1 tablet by mouth at bedtime for cholesterol.   benzonatate 200 MG capsule Commonly known as:  TESSALON   blood glucose meter kit and supplies Kit Dispense based on patient and insurance preference. Use up three times daily as directed. (FOR ICD-9 250.00, 250.01).   cefdinir 300 MG capsule Commonly known as:  OMNICEF Take 2 capsules (600 mg total) by mouth daily.   citalopram 20 MG tablet Commonly known as:  CELEXA Take 1 tablet (20 mg total) by mouth daily for 15 days.   empagliflozin 25 MG Tabs tablet Commonly known as:  JARDIANCE Take 25 mg by mouth daily.   fluticasone 50 MCG/ACT nasal spray Commonly known as:  FLONASE Place into the nose.   hydrOXYzine 50 MG tablet Commonly known as:  ATARAX/VISTARIL   Insulin Glargine 100 UNIT/ML Solostar Pen Commonly known as:  LANTUS Inject 20 Units into the skin every evening.   LATUDA 40 MG Tabs tablet Generic drug:  lurasidone Take by mouth.   losartan 100 MG tablet Commonly known as:  COZAAR Take 1 tablet by mouth once daily for blood pressure.   metFORMIN 1000 MG tablet Commonly known as:  GLUCOPHAGE Take 1 tablet (1,000 mg total) by mouth 2 (two) times daily with a meal.   Pen Needles 31G X 6 MM Misc Use with insulin as directed.   traMADol 50 MG tablet Commonly known as:  ULTRAM Take 1 tablet (50 mg total) by mouth every 8 (eight) hours as needed.

## 2018-04-02 ENCOUNTER — Ambulatory Visit: Payer: BLUE CROSS/BLUE SHIELD | Admitting: Family Medicine

## 2018-04-02 ENCOUNTER — Encounter: Payer: Self-pay | Admitting: Family Medicine

## 2018-04-02 VITALS — BP 118/80 | HR 111 | Temp 98.5°F | Ht 66.0 in | Wt 273.0 lb

## 2018-04-02 DIAGNOSIS — Z889 Allergy status to unspecified drugs, medicaments and biological substances status: Secondary | ICD-10-CM | POA: Diagnosis not present

## 2018-04-02 DIAGNOSIS — E119 Type 2 diabetes mellitus without complications: Secondary | ICD-10-CM

## 2018-04-02 DIAGNOSIS — J189 Pneumonia, unspecified organism: Secondary | ICD-10-CM | POA: Diagnosis not present

## 2018-04-02 DIAGNOSIS — R0602 Shortness of breath: Secondary | ICD-10-CM

## 2018-04-02 LAB — CBC WITH DIFFERENTIAL/PLATELET
Basophils Absolute: 0 10*3/uL (ref 0.0–0.1)
Basophils Relative: 0.6 % (ref 0.0–3.0)
Eosinophils Absolute: 0.1 10*3/uL (ref 0.0–0.7)
Eosinophils Relative: 2 % (ref 0.0–5.0)
HCT: 45.1 % (ref 36.0–46.0)
Hemoglobin: 15 g/dL (ref 12.0–15.0)
Lymphocytes Relative: 24.4 % (ref 12.0–46.0)
Lymphs Abs: 1.8 10*3/uL (ref 0.7–4.0)
MCHC: 33.2 g/dL (ref 30.0–36.0)
MCV: 84.9 fl (ref 78.0–100.0)
Monocytes Absolute: 0.4 10*3/uL (ref 0.1–1.0)
Monocytes Relative: 5.3 % (ref 3.0–12.0)
Neutro Abs: 4.9 10*3/uL (ref 1.4–7.7)
Neutrophils Relative %: 67.7 % (ref 43.0–77.0)
Platelets: 321 10*3/uL (ref 150.0–400.0)
RBC: 5.31 Mil/uL — ABNORMAL HIGH (ref 3.87–5.11)
RDW: 14 % (ref 11.5–15.5)
WBC: 7.2 10*3/uL (ref 4.0–10.5)

## 2018-04-02 LAB — BASIC METABOLIC PANEL
BUN: 17 mg/dL (ref 6–23)
CO2: 29 mEq/L (ref 19–32)
Calcium: 9.3 mg/dL (ref 8.4–10.5)
Chloride: 96 mEq/L (ref 96–112)
Creatinine, Ser: 0.89 mg/dL (ref 0.40–1.20)
GFR: 75.21 mL/min (ref >60.00)
Glucose, Bld: 399 mg/dL — ABNORMAL HIGH (ref 70–99)
Potassium: 4 mEq/L (ref 3.5–5.1)
Sodium: 132 mEq/L — ABNORMAL LOW (ref 135–145)

## 2018-04-02 LAB — D-DIMER, QUANTITATIVE: D-Dimer, Quant: <0.19 mcg/mL FEU (ref <0.50)

## 2018-04-02 MED ORDER — FLUTICASONE-SALMETEROL 250-50 MCG/DOSE IN AEPB: 1.0000 | INHALATION_SPRAY | Freq: Two times a day (BID) | RESPIRATORY_TRACT | 0 refills | Status: DC

## 2018-04-02 MED ORDER — PREDNISONE 20 MG PO TABS: ORAL_TABLET | ORAL | 0 refills | Status: DC

## 2018-04-02 NOTE — Progress Notes (Signed)
**Note De-Identified Joan Obfuscation** Dr. Frederico Hamman T. Timothee Gali, MD, [facility] [place], [address] Phone: [phone] Fax: [phone]  04/02/2018  Patient: Joan [name], MRN: [id number], DOB: 04-Dec-1980, 37 y.o.  Primary Physician:  Pleas Koch, NP   Chief Complaint  Patient presents with  . Back Pain    upper  . Shortness of Breath  . Decrease urine output   Subjective:   Joan [name] is a 37 y.o. very pleasant female patient who presents with the following:  OV # 6 for recent illness, now with some upper back pain.  This is not really true back pain, but the patient does have some persistent illness as well as coughing and some shortness of breath.  She also thinks that she is dehydrated somewhat.  She appears much the same as she did earlier in the week when I saw her.  This marks her sixth office visit for this same illness.  Still not feeling well - having some sob and coughing.  Pulse ox 97%  Lab Results  Component Value Date   HGBA1C 10.1 (H) 02/27/2018      Past Medical History, Surgical History, Social History, Family History, Problem List, Medications, and Allergies have been reviewed and updated if relevant.  Patient Active Problem List   Diagnosis Date Noted  . RLQ abdominal pain 02/13/2018  . BV (bacterial vaginosis) 02/13/2018  . Type 2 diabetes mellitus without complication, without long-term current use of insulin ([place]) 11/24/2017  . Essential hypertension 11/24/2017  . Hyperlipidemia 11/24/2017  . Major depressive disorder, recurrent, severe without psychotic features ([place])   . PTSD (post-traumatic stress disorder) 04/11/2015    Past Medical History:  Diagnosis Date  . Auditory hallucinations   . Diabetes mellitus without complication (HCC)    diet controlled  . Essential hypertension   . Essential hypertension 11/24/2017  . MDD (major depressive disorder)   . Miscarriage   . PTSD (post-traumatic stress  disorder)     Past Surgical History:  Procedure Laterality Date  . ADENOIDECTOMY    . CHOLECYSTECTOMY  2009  . TONSILLECTOMY      Social History   Socioeconomic History  . Marital status: Single    Spouse name: Not on file  . Number of children: Not on file  . Years of education: Not on file  . Highest education level: Not on file  Occupational History  . Not on file  Social Needs  . Financial resource strain: Not on file  . Food insecurity:    Worry: Not on file    Inability: Not on file  . Transportation needs:    Medical: Not on file    Non-medical: Not on file  Tobacco Use  . Smoking status: Never Smoker  . Smokeless tobacco: Never Used  Substance and Sexual Activity  . Alcohol use: No  . Drug use: No  . Sexual activity: Not on file  Lifestyle  . Physical activity:    Days per week: Not on file    Minutes per session: Not on file  . Stress: Not on file  Relationships  . Social connections:    Talks on phone: Not on file    Gets together: Not on file    Attends religious service: Not on file    Active member of club or organization: Not on file    Attends meetings of clubs or organizations: Not on file    Relationship status: Not on  file  . Intimate partner violence:    Fear of current or ex partner: Not on file    Emotionally abused: Not on file    Physically abused: Not on file    Forced sexual activity: Not on file  Other Topics Concern  . Not on file  Social History Narrative   Engaged.    Family History  Problem Relation Age of Onset  . Asthma Mother   . Diabetes Mother   . Hyperlipidemia Mother   . Hypertension Mother   . Diabetes Father   . Hyperlipidemia Father   . Hypertension Father   . Diabetes Brother   . Depression Brother   . Alcohol abuse Brother   . Depression Maternal Grandmother   . Stroke Paternal Grandmother     Allergies  Allergen Reactions  . Ciprofloxacin Hives and Rash  . Ibuprofen Swelling    Facial  Other  reaction(s): Other, Other (See Comments) Whole face swelling Facial  Facial    . Penicillins Hives and Rash    Has patient had a PCN reaction causing immediate rash, facial/tongue/throat swelling, SOB or lightheadedness with hypotension: No Has patient had a PCN reaction causing severe rash involving mucus membranes or skin necrosis: No Has patient had a PCN reaction that required hospitalization: No Has patient had a PCN reaction occurring within the last 10 years: No If all of the above answers are "NO", then may proceed with Cephalosporin use. THE PATIENT IS ABLE TO TOLERATE CEPHALOSPORINS WITHOUT DIFFIC  . Sulfa Antibiotics Hives and Rash    Medication list reviewed and updated in full in [facility].  GEN: no acute illness or fever CV: No chest pain or shortness of breath MSK: detailed above Neuro: neurological signs are described above ROS O/w per HPI  Objective:   BP 118/80   Pulse (!) 111   Temp 98.5 F (36.9 C) (Oral)   Ht '5\' 6"'  (1.676 m)   Wt 273 lb (123.8 kg)   LMP 03/19/2018   SpO2 97%   BMI 44.06 kg/m   Pulse 90 on my exam   GEN: A and O x 3. WDWN. NAD.    ENT: Nose clear, ext NML.  No LAD.  No JVD.  TM's clear. Oropharynx clear.  PULM: Normal WOB, no distress. No crackles, wheezes, rhonchi. CV: RRR, no M/G/R, No rubs, No JVD.   EXT: warm and well-perfused, No c/c/e. PSYCH: Pleasant and conversant.   Radiology: Results for orders placed or performed in visit on 04/02/18  CBC with Differential/Platelet  Result Value Ref Range   WBC 7.2 4.0 - 10.5 K/uL   RBC 5.31 (H) 3.87 - 5.11 Mil/uL   Hemoglobin 15.0 12.0 - 15.0 g/dL   HCT 45.1 36.0 - 46.0 %   MCV 84.9 78.0 - 100.0 fl   MCHC 33.2 30.0 - 36.0 g/dL   RDW 14.0 11.5 - 15.5 %   Platelets 321.0 150.0 - 400.0 K/uL   Neutrophils Relative % 67.7 43.0 - 77.0 %   Lymphocytes Relative 24.4 12.0 - 46.0 %   Monocytes Relative 5.3 3.0 - 12.0 %   Eosinophils Relative 2.0 0.0 - 5.0 %   Basophils  Relative 0.6 0.0 - 3.0 %   Neutro Abs 4.9 1.4 - 7.7 K/uL   Lymphs Abs 1.8 0.7 - 4.0 K/uL   Monocytes Absolute 0.4 0.1 - 1.0 K/uL   Eosinophils Absolute 0.1 0.0 - 0.7 K/uL   Basophils Absolute 0.0 0.0 - 0.1 K/uL  D-dimer,  quantitative (not at [facility])  Result Value Ref Range   D-Dimer, Quant <0.19 <0.50 mcg/mL FEU  Basic metabolic panel  Result Value Ref Range   Sodium 132 (L) 135 - 145 mEq/L   Potassium 4.0 3.5 - 5.1 mEq/L   Chloride 96 96 - 112 mEq/L   CO2 29 19 - 32 mEq/L   Glucose, Bld 399 (H) 70 - 99 mg/dL   BUN 17 6 - 23 mg/dL   Creatinine, Ser 0.89 0.40 - 1.20 mg/dL   Calcium 9.3 8.4 - 10.5 mg/dL   GFR 75.21 >60.00 mL/min     Assessment and Plan:   Shortness of breath - Plan: CBC with Differential/Platelet, D-dimer, quantitative (not at [facility]), Basic metabolic panel  Community acquired pneumonia, unspecified laterality  Multiple allergies  Type 2 diabetes mellitus without complication, without long-term current use of insulin (HCC)  At this point, the patient's labs have all returned very reassuring.  Her d-dimer is negative.  Blood count is actually quite reassuring also.  The only thing of note is an elevated blood sugar in the setting of doses of steroids.  She globally looks in no acute distress in the office.  I wonder if some of this may be post infectious cough or reactive airways post infection.  I am going to give her some oral steroids and give her some Advair to use while she is recuperating.  Follow-up: No follow-ups on file.  Meds ordered this encounter  Medications  . Fluticasone-Salmeterol (ADVAIR DISKUS) 250-50 MCG/DOSE AEPB    Sig: Inhale 1 puff into the lungs 2 (two) times daily.    Dispense:  1 each    Refill:  0  . predniSONE (DELTASONE) 20 MG tablet    Sig: 2 tabs po for 4 days, then 1 tab po for 4 days    Dispense:  12 tablet    Refill:  0   Orders Placed This Encounter  Procedures  . CBC with Differential/Platelet  . D-dimer, quantitative  (not at [facility])  . Basic metabolic panel    Signed,  Frederico Hamman T. Kasyn Stouffer, MD   Allergies as of 04/02/2018      Reactions   Ciprofloxacin Hives, Rash   Ibuprofen Swelling   Facial  Other reaction(s): Other, Other (See Comments) Whole face swelling Facial  Facial    Penicillins Hives, Rash   Has patient had a PCN reaction causing immediate rash, facial/tongue/throat swelling, SOB or lightheadedness with hypotension: No Has patient had a PCN reaction causing severe rash involving mucus membranes or skin necrosis: No Has patient had a PCN reaction that required hospitalization: No Has patient had a PCN reaction occurring within the last 10 years: No If all of the above answers are "NO", then may proceed with Cephalosporin use. Other reaction(s): Other (See Comments) Has patient had a PCN reaction causing immediate rash, facial/tongue/throat swelling, SOB or lightheadedness with hypotension: No Has patient had a PCN reaction causing severe rash involving mucus membranes or skin necrosis: No Has patient had a PCN reaction that required hospitalization: No Has patient had a PCN reaction occurring within the last 10 years: No If all of the above answers are "NO", then may proceed with Cephalosporin use. Has patient had a PCN reaction causing immediate rash, facial/tongue/throat swelling, SOB or lightheadedness with hypotension: No Has patient had a PCN reaction causing severe rash involving mucus membranes or skin necrosis: No Has patient had a PCN reaction that required hospitalization: No Has patient had a PCN reaction occurring  within the last 10 years: No If all of the above answers are "NO", then may proceed with Cephalosporin use. Has patient had a PCN reaction causing immediate rash, facial/tongue/throat swelling, SOB or lightheadedness with hypotension: No Has patient had a PCN reaction causing severe rash involving mucus membranes or skin necrosis: No Has patient had a PCN reaction  that required hospitalization: No Has patient had a PCN reaction occurring within the last 10 years: No If all of the above answers are "NO", then may proceed with Cephalosporin use. Has patient had a PCN reaction causing immediate rash, facial/tongue/throat swelling, SOB or lightheadedness with hypotension: No Has patient had a PCN reaction causing severe rash involving mucus membranes or skin necrosis: No Has patient had a PCN reaction that required hospitalization: No Has patient had a PCN reaction occurring within the last 10 years: No If all of the above answers are "NO", then may proceed with Cephalosporin use.   Sulfa Antibiotics Hives, Rash      Medication List        Accurate as of 04/02/18 11:59 PM. Always use your most recent med list.          AEROCHAMBER PLUS inhaler Use as instructed. Generic spacer if possible   albuterol 108 (90 Base) MCG/ACT inhaler Commonly known as:  PROVENTIL HFA;VENTOLIN HFA Inhale 2 puffs into the lungs every 6 (six) hours as needed for wheezing or shortness of breath.   atorvastatin 40 MG tablet Commonly known as:  LIPITOR Take 1 tablet by mouth at bedtime for cholesterol.   benzonatate 200 MG capsule Commonly known as:  TESSALON   blood glucose meter kit and supplies Kit Dispense based on patient and insurance preference. Use up three times daily as directed. (FOR ICD-9 250.00, 250.01).   cefdinir 300 MG capsule Commonly known as:  OMNICEF Take 2 capsules (600 mg total) by mouth daily.   citalopram 20 MG tablet Commonly known as:  CELEXA Take 1 tablet (20 mg total) by mouth daily for 15 days.   empagliflozin 25 MG Tabs tablet Commonly known as:  JARDIANCE Take 25 mg by mouth daily.   fluticasone 50 MCG/ACT nasal spray Commonly known as:  FLONASE Place into the nose.   Fluticasone-Salmeterol 250-50 MCG/DOSE Aepb Commonly known as:  ADVAIR Inhale 1 puff into the lungs 2 (two) times daily.   hydrOXYzine 50 MG  tablet Commonly known as:  ATARAX/VISTARIL   Insulin Glargine 100 UNIT/ML Solostar Pen Commonly known as:  LANTUS Inject 20 Units into the skin every evening.   LATUDA 40 MG Tabs tablet Generic drug:  lurasidone Take by mouth.   losartan 100 MG tablet Commonly known as:  COZAAR Take 1 tablet by mouth once daily for blood pressure.   metFORMIN 1000 MG tablet Commonly known as:  GLUCOPHAGE Take 1 tablet (1,000 mg total) by mouth 2 (two) times daily with a meal.   Pen Needles 31G X 6 MM Misc Use with insulin as directed.   predniSONE 20 MG tablet Commonly known as:  DELTASONE 2 tabs po for 4 days, then 1 tab po for 4 days   traMADol 50 MG tablet Commonly known as:  ULTRAM Take 1 tablet (50 mg total) by mouth every 8 (eight) hours as needed.

## 2018-04-02 NOTE — Patient Instructions (Signed)
REFERRALS TO SPECIALISTS, SPECIAL TESTS (MRI, CT, ULTRASOUNDS)  MARION or  Anastasiya will help you. ASK CHECK-IN FOR HELP.  Specialist appointment times vary a great deal, based on their schedule / openings. -- Some specialists have very long wait times. (Example. Dermatology)    

## 2018-04-03 ENCOUNTER — Encounter: Payer: Self-pay | Admitting: Family Medicine

## 2018-04-10 ENCOUNTER — Ambulatory Visit: Payer: BLUE CROSS/BLUE SHIELD | Admitting: Primary Care

## 2018-04-10 VITALS — BP 124/84 | HR 93 | Temp 98.2°F | Ht 66.0 in | Wt 273.5 lb

## 2018-04-10 DIAGNOSIS — F332 Major depressive disorder, recurrent severe without psychotic features: Secondary | ICD-10-CM | POA: Diagnosis not present

## 2018-04-10 DIAGNOSIS — R05 Cough: Secondary | ICD-10-CM | POA: Diagnosis not present

## 2018-04-10 DIAGNOSIS — R059 Cough, unspecified: Secondary | ICD-10-CM

## 2018-04-10 DIAGNOSIS — J45901 Unspecified asthma with (acute) exacerbation: Secondary | ICD-10-CM

## 2018-04-10 DIAGNOSIS — J4540 Moderate persistent asthma, uncomplicated: Secondary | ICD-10-CM | POA: Diagnosis not present

## 2018-04-10 DIAGNOSIS — E119 Type 2 diabetes mellitus without complications: Secondary | ICD-10-CM

## 2018-04-10 HISTORY — DX: Unspecified asthma with (acute) exacerbation: J45.901

## 2018-04-10 MED ORDER — CITALOPRAM HYDROBROMIDE 20 MG PO TABS: 20.0000 mg | ORAL_TABLET | Freq: Every day | ORAL | 0 refills | Status: DC

## 2018-04-10 MED ORDER — INSULIN GLARGINE 100 UNIT/ML SOLOSTAR PEN: 24.0000 [IU] | PEN_INJECTOR | Freq: Every evening | SUBCUTANEOUS | 5 refills | Status: DC

## 2018-04-10 MED ORDER — GUAIFENESIN-CODEINE 100-10 MG/5ML PO SYRP: 5.0000 mL | ORAL_SOLUTION | Freq: Three times a day (TID) | ORAL | 0 refills | Status: DC | PRN

## 2018-04-10 NOTE — Patient Instructions (Signed)
We've increased your Lantus to 24 units nightly. Continue Metformin and Jardiance.  Start checking your blood sugars at least 2-3 times daily at different times. Before breakfast, 2 hours after a meal, bedtime. Please call me if you see numbers above 200 consistently.   Use the albuterol inhaler every 4 hours as needed for wheezing/shortness of breath/cough.  Continue Advair daily.  You will be contacted regarding your referral to pulmonology.  Please let us know if you have not been contacted within one week.   You may take the cough suppressant every 8 hours as needed for cough and rest. Caution this medication contains codeine and will make you feel drowsy.  Schedule a follow up visit in 6 weeks, please bring your glucose logs. We will do labs at the next visit.  It was a pleasure to see you today!

## 2018-04-10 NOTE — Assessment & Plan Note (Signed)
Compliant to Advair BID that was initiated last week, not using albuterol inhaler.  No wheezing on exam today, good air movement throughout lung fields.  Discussed to use albuterol every 4-6 hours as needed. Finish current round of prednisone, no additional prednisone needed. She appears stable, is in no distress.  Referral placed to pulmonology for PFT's and evaluation.

## 2018-04-10 NOTE — Progress Notes (Signed)
Subjective:    Patient ID: Joan [name], female    DOB: Jan 28, 1981, 37 y.o.   MRN: [id number]  HPI  Joan [name] is a 37 year old female who presents today for follow up.  Current medications include: Metformin 1000 mg BID, Jardiance 25 mg, Lantus 20 units HS. She has been treated with three rounds of prednisone within the last month.  She is checking her blood glucose once daily and is getting readings of: AM fasting: When on prednisone 450-500.  AM fasting: 180-190 before prednisone   Last A1C: 10.1 in late July 2019 Last Eye Exam: Referred, patient cancelled her appointment. Last Foot Exam: Completed in April 2019 Pneumonia Vaccination: Completed in 2017 ACE/ARB: Losartan Statin: :Lipitor  Diet currently consists of:  Breakfast: Oatmeal, grits, fast food Lunch: Soup, Sandwich, fast food Dinner: Pasta, rice, protein, vegetables Snacks: None Desserts: Daily Beverages: Soda, water  Exercise: She is not exercising   2) Asthma: Concerned about her asthma. Treated for acute bronchitis in early August with IM steroids, Doxycycline, Flonase. Treated for acute bronchitis on 03/27/18 through [facility] with prednisone, Zpak, Xopenex nebulized treatment. Treated again one week ago in our clinic, initiated on Advair for which she's using twice daily, also another round of prednisone. She's not using her albuterol inhaler much. She's noticed feeling better for a few days, then feeling worse. She denies fevers.     Review of Systems  Constitutional: Negative for fever.  HENT: Positive for congestion. Negative for sinus pressure.   Respiratory: Positive for cough and shortness of breath. Negative for wheezing.   Cardiovascular: Negative for chest pain.       Past Medical History:  Diagnosis Date  . Auditory hallucinations   . Diabetes mellitus without complication (HCC)    diet controlled  . Essential hypertension   . Essential hypertension 11/24/2017  . MDD (major  depressive disorder)   . Miscarriage   . PTSD (post-traumatic stress disorder)      Social History   Socioeconomic History  . Marital status: Single    Spouse name: Not on file  . Number of children: Not on file  . Years of education: Not on file  . Highest education level: Not on file  Occupational History  . Not on file  Social Needs  . Financial resource strain: Not on file  . Food insecurity:    Worry: Not on file    Inability: Not on file  . Transportation needs:    Medical: Not on file    Non-medical: Not on file  Tobacco Use  . Smoking status: Never Smoker  . Smokeless tobacco: Never Used  Substance and Sexual Activity  . Alcohol use: No  . Drug use: No  . Sexual activity: Not on file  Lifestyle  . Physical activity:    Days per week: Not on file    Minutes per session: Not on file  . Stress: Not on file  Relationships  . Social connections:    Talks on phone: Not on file    Gets together: Not on file    Attends religious service: Not on file    Active member of club or organization: Not on file    Attends meetings of clubs or organizations: Not on file    Relationship status: Not on file  . Intimate partner violence:    Fear of current or ex partner: Not on file    Emotionally abused: Not on file    Physically abused:  Not on file    Forced sexual activity: Not on file  Other Topics Concern  . Not on file  Social History Narrative   Engaged.    Past Surgical History:  Procedure Laterality Date  . ADENOIDECTOMY    . CHOLECYSTECTOMY  2009  . TONSILLECTOMY      Family History  Problem Relation Age of Onset  . Asthma Mother   . Diabetes Mother   . Hyperlipidemia Mother   . Hypertension Mother   . Diabetes Father   . Hyperlipidemia Father   . Hypertension Father   . Diabetes Brother   . Depression Brother   . Alcohol abuse Brother   . Depression Maternal Grandmother   . Stroke Paternal Grandmother     Allergies  Allergen Reactions  .  Ciprofloxacin Hives and Rash  . Ibuprofen Swelling    Facial  Other reaction(s): Other, Other (See Comments) Whole face swelling Facial  Facial    . Penicillins Hives and Rash    Has patient had a PCN reaction causing immediate rash, facial/tongue/throat swelling, SOB or lightheadedness with hypotension: No Has patient had a PCN reaction causing severe rash involving mucus membranes or skin necrosis: No Has patient had a PCN reaction that required hospitalization: No Has patient had a PCN reaction occurring within the last 10 years: No If all of the above answers are "NO", then may proceed with Cephalosporin use. THE PATIENT IS ABLE TO TOLERATE CEPHALOSPORINS WITHOUT DIFFIC  . Sulfa Antibiotics Hives and Rash    Current Outpatient Medications on File Prior to Visit  Medication Sig Dispense Refill  . albuterol (PROVENTIL HFA;VENTOLIN HFA) 108 (90 Base) MCG/ACT inhaler Inhale 2 puffs into the lungs every 6 (six) hours as needed for wheezing or shortness of breath. 1 Inhaler 1  . atorvastatin (LIPITOR) 40 MG tablet Take 1 tablet by mouth at bedtime for cholesterol. 90 tablet 1  . benzonatate (TESSALON) 200 MG capsule   0  . blood glucose meter kit and supplies KIT Dispense based on patient and insurance preference. Use up three times daily as directed. (FOR ICD-9 250.00, 250.01). 1 each 0  . empagliflozin (JARDIANCE) 25 MG TABS tablet Take 25 mg by mouth daily. 90 mg 3  . fluticasone (FLONASE) 50 MCG/ACT nasal spray Place into the nose.    Marland Kitchen Fluticasone-Salmeterol (ADVAIR DISKUS) 250-50 MCG/DOSE AEPB Inhale 1 puff into the lungs 2 (two) times daily. 1 each 0  . hydrOXYzine (ATARAX/VISTARIL) 50 MG tablet   0  . Insulin Pen Needle (PEN NEEDLES) 31G X 6 MM MISC Use with insulin as directed. 100 each 2  . losartan (COZAAR) 100 MG tablet Take 1 tablet by mouth once daily for blood pressure. 90 tablet 3  . lurasidone (LATUDA) 40 MG TABS tablet Take by mouth.    . metFORMIN (GLUCOPHAGE) 1000 MG  tablet Take 1 tablet (1,000 mg total) by mouth 2 (two) times daily with a meal. 180 tablet 3  . predniSONE (DELTASONE) 20 MG tablet 2 tabs po for 4 days, then 1 tab po for 4 days 12 tablet 0  . Spacer/Aero-Holding Chambers (AEROCHAMBER PLUS) inhaler Use as instructed. Generic spacer if possible 1 each 2   No current facility-administered medications on file prior to visit.     BP 124/84   Pulse 93   Temp 98.2 F (36.8 C) (Oral)   Ht '5\' 6"'$  (1.676 m)   Wt 273 lb 8 oz (124.1 kg)   LMP 03/19/2018   BMI 44.14  kg/m    Objective:   Physical Exam  Constitutional: She appears well-nourished. She does not appear ill.  HENT:  Right Ear: Tympanic membrane and ear canal normal.  Left Ear: Tympanic membrane and ear canal normal.  Nose: No mucosal edema. Right sinus exhibits no maxillary sinus tenderness and no frontal sinus tenderness. Left sinus exhibits no maxillary sinus tenderness and no frontal sinus tenderness.  Mouth/Throat: Oropharynx is clear and moist.  Neck: Neck supple.  Cardiovascular: Normal rate and regular rhythm.  Respiratory: Effort normal and breath sounds normal. She has no wheezes.  Dry cough during exam  Skin: Skin is warm and dry.           Assessment & Plan:

## 2018-04-10 NOTE — Assessment & Plan Note (Signed)
Recent spikes in glucose secondary to recurrent steroid use. It does appear that she did notice improvement with increased dose of Lantus, but not good enough improvement.  Increase Lantus to 24 units HS. Continue metformin and Jardiance.   Discussed that she will need to check her blood sugars at least twice daily, rotating times of checks.  Follow up in 6 weeks for re-evaluation and repeat A1C.

## 2018-04-15 NOTE — Progress Notes (Signed)
[facility] Consultation      Assessment and Plan:  Asthma, likely allergic. -Severe persistent asthma with daily symptoms, poor control, though improving over the last few weeks. - We will prescribe Advair to be continued.  She is given a prescription for nebulizer and DuoNeb's to be used as needed, as she notes that she felt better with these in the past, and feels that the albuterol metered-dose inhaler does little for her. - Likely allergic component with  positive pet allergies with both the dog and cat living with her and in the bedroom.  I advised her to remove pets from the bedroom, will start Singulair, asked her to take an antihistamine over-the-counter.  Diabetes mellitus. - Patient's blood sugars have been elevated due to poor asthma control as well as multiple courses of prednisone. - We will see if we can control her asthma better, and that this should help with blood sugar control.  Excessive daytime sleepiness.  Obesity. - Symptoms and signs of obstructive sleep apnea. - We will send for sleep study once her respiratory symptoms have resolved.  Will start CPAP as needed.  Orders Placed This Encounter  Procedures  . Ambulatory Referral for DME  . Home sleep test   Meds ordered this encounter  Medications  . montelukast (SINGULAIR) 10 MG tablet    Sig: Take 1 tablet (10 mg total) by mouth at bedtime.    Dispense:  30 tablet    Refill:  3  . Fluticasone-Salmeterol (ADVAIR DISKUS) 250-50 MCG/DOSE AEPB    Sig: Inhale 1 puff into the lungs 2 (two) times daily. Rinse mouth after use.    Dispense:  1 each    Refill:  0  . ipratropium-albuterol (DUONEB) 0.5-2.5 (3) MG/3ML SOLN    Sig: Take 3 mLs by nebulization every 6 (six) hours as needed.    Dispense:  360 mL    Refill:  5   Return in about 10 weeks (around 06/25/2018).   Date: 04/16/2018  MRN# [id number] Joan [name] 17-Oct-1980  Referring Physician: NP Carlis Abbott.   Joan [name] is a 37  y.o. old female seen in consultation for chief complaint of:    Chief Complaint  Patient presents with  . Consult  . Asthma    SOB w/activity: chest tightness: dry cough    HPI:   Patient is a 37 year old female with a history of asthma.  Her asthma has been flaring up and has been difficult to control over the last few months.  She has required multiple course of prednisone, which have been turn caused her diabetes control to worsen and her blood sugar to elevate, she is thus required higher doses of Lantus to keep her blood sugar controlled.   She was diagnosed with seasonal asthma 6 or 7 years ago, however she got a pneumonia about 2 weeks ago. She always had dyspnea on moderate exercise, on routine ADL she would have dyspnea on taking a shower or other housework. About 2-3 weeks ago things progressed and she got the pneumonia.  She has been maintained on albuterol and feels that it does not help. She has been given a nebulizer treatment and it helped.  She was recently started on advair, it helps a bit but not much. She is not on singulair or any other inhalers.  She does have occasional reflux, once or twice per week. She has 2 dogs and a cat, they are not new. 1 dog sleeps in the bed, and  a cat sleeps in the room.  Her last prednisone was about 2 weeks ago.   She snores at night, she has never been tested for OSA, she does feel sleepy during the day. Goes to bed at 9 pm, She wakes up constantly at night, wakes at 7 am. She works in a daycare. If she is not working and sleeps in on non-working days but still feels sleepy and her sleep un unrefreshing.   She uses her albuterol via spacer, her inhaler technique was monitored today and was effective. Recommend that she take a full breath and hold it.  **Chest x-ray 10/23/2017, lung bases on CT abdomen pelvis 02/12/2018>> imaging personally reviewed, lungs are unremarkable. **CBC 02/12/2018>> absolute eosinophil count is 200 **Rast testing  01/21/2017>> positive for mold, dog, oak.  Strongly positive for cat dander.  Mildly positive for grass.   PMHX:   Past Medical History:  Diagnosis Date  . Auditory hallucinations   . Diabetes mellitus without complication (HCC)    diet controlled  . Essential hypertension   . Essential hypertension 11/24/2017  . MDD (major depressive disorder)   . Miscarriage   . PTSD (post-traumatic stress disorder)    Surgical Hx:  Past Surgical History:  Procedure Laterality Date  . ADENOIDECTOMY    . CHOLECYSTECTOMY  2009  . TONSILLECTOMY     Family Hx:  Family History  Problem Relation Age of Onset  . Asthma Mother   . Diabetes Mother   . Hyperlipidemia Mother   . Hypertension Mother   . Diabetes Father   . Hyperlipidemia Father   . Hypertension Father   . Diabetes Brother   . Depression Brother   . Alcohol abuse Brother   . Depression Maternal Grandmother   . Stroke Paternal Grandmother    Social Hx:   Social History   Tobacco Use  . Smoking status: Never Smoker  . Smokeless tobacco: Never Used  Substance Use Topics  . Alcohol use: No  . Drug use: No   Medication:    Current Outpatient Medications:  .  albuterol (PROVENTIL HFA;VENTOLIN HFA) 108 (90 Base) MCG/ACT inhaler, Inhale 2 puffs into the lungs every 6 (six) hours as needed for wheezing or shortness of breath., Disp: 1 Inhaler, Rfl: 1 .  atorvastatin (LIPITOR) 40 MG tablet, Take 1 tablet by mouth at bedtime for cholesterol., Disp: 90 tablet, Rfl: 1 .  benzonatate (TESSALON) 200 MG capsule, , Disp: , Rfl: 0 .  blood glucose meter kit and supplies KIT, Dispense based on patient and insurance preference. Use up three times daily as directed. (FOR ICD-9 250.00, 250.01)., Disp: 1 each, Rfl: 0 .  citalopram (CELEXA) 20 MG tablet, Take 1 tablet (20 mg total) by mouth daily., Disp: 90 tablet, Rfl: 0 .  empagliflozin (JARDIANCE) 25 MG TABS tablet, Take 25 mg by mouth daily., Disp: 90 mg, Rfl: 3 .  fluticasone (FLONASE) 50  MCG/ACT nasal spray, Place into the nose., Disp: , Rfl:  .  Fluticasone-Salmeterol (ADVAIR DISKUS) 250-50 MCG/DOSE AEPB, Inhale 1 puff into the lungs 2 (two) times daily., Disp: 1 each, Rfl: 0 .  guaiFENesin-codeine (ROBITUSSIN AC) 100-10 MG/5ML syrup, Take 5 mLs by mouth 3 (three) times daily as needed for cough., Disp: 75 mL, Rfl: 0 .  hydrOXYzine (ATARAX/VISTARIL) 50 MG tablet, , Disp: , Rfl: 0 .  Insulin Glargine (LANTUS) 100 UNIT/ML Solostar Pen, Inject 24 Units into the skin every evening., Disp: 15 mL, Rfl: 5 .  Insulin Pen Needle (PEN NEEDLES)  31G X 6 MM MISC, Use with insulin as directed., Disp: 100 each, Rfl: 2 .  losartan (COZAAR) 100 MG tablet, Take 1 tablet by mouth once daily for blood pressure., Disp: 90 tablet, Rfl: 3 .  lurasidone (LATUDA) 40 MG TABS tablet, Take by mouth., Disp: , Rfl:  .  metFORMIN (GLUCOPHAGE) 1000 MG tablet, Take 1 tablet (1,000 mg total) by mouth 2 (two) times daily with a meal., Disp: 180 tablet, Rfl: 3 .  Spacer/Aero-Holding Chambers (AEROCHAMBER PLUS) inhaler, Use as instructed. Generic spacer if possible, Disp: 1 each, Rfl: 2   Allergies:  Ciprofloxacin; Ibuprofen; Penicillins; and Sulfa antibiotics  Review of Systems: Gen:  Denies  fever, sweats, chills HEENT: Denies blurred vision, double vision. bleeds, sore throat Cvc:  No dizziness, chest pain. Resp:   Denies cough or sputum production, shortness of breath Gi: Denies swallowing difficulty, stomach pain. Gu:  Denies bladder incontinence, burning urine Ext:   No Joint pain, stiffness. Skin: No skin rash,  hives  Endoc:  No polyuria, polydipsia. Psych: No depression, insomnia. Other:  All other systems were reviewed with the patient and were negative other that what is mentioned in the HPI.   Physical Examination:   VS: BP 126/78 (BP Location: Left Arm, Cuff Size: Normal)   Pulse (!) 109   Ht '5\' 6"'  (1.676 m)   Wt 276 lb (125.2 kg)   LMP 03/19/2018   SpO2 96%   BMI 44.55 kg/m   General  Appearance: No distress  Neuro:without focal findings,  speech normal,  HEENT: PERRLA, EOM intact.   Pulmonary: normal breath sounds, No wheezing.  CardiovascularNormal S1,S2.  No m/r/g.   Abdomen: Benign, Soft, non-tender. Renal:  No costovertebral tenderness  GU:  No performed at this time. Endoc: No evident thyromegaly, no signs of acromegaly. Skin:   warm, no rashes, no ecchymosis  Extremities: normal, no cyanosis, clubbing.  Other findings:    LABORATORY PANEL:   CBC No results for input(s): WBC, HGB, HCT, PLT in the last 168 hours. ------------------------------------------------------------------------------------------------------------------  Chemistries  No results for input(s): NA, K, CL, CO2, GLUCOSE, BUN, CREATININE, CALCIUM, MG, AST, ALT, ALKPHOS, BILITOT in the last 168 hours.  Invalid input(s): GFRCGP ------------------------------------------------------------------------------------------------------------------  Cardiac Enzymes No results for input(s): TROPONINI in the last 168 hours. ------------------------------------------------------------  RADIOLOGY:  No results found.     Thank  you for the consultation and for allowing [facility] to assist in the care of your patient. Our recommendations are noted above.  Please contact us if we can be of further service.   Marda Stalker, M.D., F.C.C.P.  Board Certified in Internal Medicine, Pulmonary Medicine, [place], and Sleep Medicine.  [facility] Office Number: [phone]   04/16/2018

## 2018-04-16 ENCOUNTER — Encounter: Payer: Self-pay | Admitting: Internal Medicine

## 2018-04-16 ENCOUNTER — Ambulatory Visit: Payer: BLUE CROSS/BLUE SHIELD | Admitting: Internal Medicine

## 2018-04-16 VITALS — BP 126/78 | HR 109 | Ht 66.0 in | Wt 276.0 lb

## 2018-04-16 DIAGNOSIS — G4719 Other hypersomnia: Secondary | ICD-10-CM

## 2018-04-16 DIAGNOSIS — J45909 Unspecified asthma, uncomplicated: Secondary | ICD-10-CM

## 2018-04-16 MED ORDER — MONTELUKAST SODIUM 10 MG PO TABS: 10.0000 mg | ORAL_TABLET | Freq: Every day | ORAL | 3 refills | Status: DC

## 2018-04-16 MED ORDER — FLUTICASONE-SALMETEROL 250-50 MCG/DOSE IN AEPB: 1.0000 | INHALATION_SPRAY | Freq: Two times a day (BID) | RESPIRATORY_TRACT | 0 refills | Status: DC

## 2018-04-16 MED ORDER — IPRATROPIUM-ALBUTEROL 0.5-2.5 (3) MG/3ML IN SOLN: 3.0000 mL | Freq: Four times a day (QID) | RESPIRATORY_TRACT | 5 refills | Status: DC | PRN

## 2018-04-16 NOTE — Patient Instructions (Addendum)
Keep pets out of bedroom.  Continue advair, will prescribe a nebulizer to use 2 to 3 times per day as needed.  Take an antihistamine once daily such as allegra or claritin.  Will start singulair (montelukast) once every night.   Will send for sleep study once your asthma is better (in a about a month).   Sleep Apnea    Sleep apnea is disorder that affects a person's sleep. A person with sleep apnea has abnormal pauses in their breathing when they sleep. It is hard for them to get a good sleep. This makes a person tired during the day. It also can lead to other physical problems. There are three types of sleep apnea. One type is when breathing stops for a short time because your airway is blocked (obstructive sleep apnea). Another type is when the brain sometimes fails to give the normal signal to breathe to the muscles that control your breathing (central sleep apnea). The third type is a combination of the other two types.  HOME CARE   Take all medicine as told by your doctor.  Avoid alcohol, calming medicines (sedatives), and depressant drugs.  Try to lose weight if you are overweight. Talk to your doctor about a healthy weight goal.  Your doctor may have you use a device that helps to open your airway. It can help you get the air that you need. It is called a positive airway pressure (PAP) device.   MAKE SURE YOU:   Understand these instructions.  Will watch your condition.  Will get help right away if you are not doing well or get worse.  It may take approximately 1 month for you to get used to wearing her CPAP every night.  Be sure to work with your machine to get used to it, be patient, it may take time!  If you have trouble tolerating CPAP DO NOT RETURN YOUR MACHINE; Contact our office to see if we can help you tolerate the CPAP better first!

## 2018-04-17 ENCOUNTER — Telehealth: Payer: Self-pay | Admitting: *Deleted

## 2018-04-17 DIAGNOSIS — J45909 Unspecified asthma, uncomplicated: Secondary | ICD-10-CM | POA: Diagnosis not present

## 2018-04-17 NOTE — Telephone Encounter (Signed)
PA intiated for [facility]  covermymeds key [id number] Pending approval.

## 2018-04-22 ENCOUNTER — Other Ambulatory Visit: Payer: Self-pay | Admitting: Family Medicine

## 2018-04-22 DIAGNOSIS — J302 Other seasonal allergic rhinitis: Secondary | ICD-10-CM

## 2018-04-26 DIAGNOSIS — R05 Cough: Secondary | ICD-10-CM | POA: Diagnosis not present

## 2018-04-26 DIAGNOSIS — R112 Nausea with vomiting, unspecified: Secondary | ICD-10-CM | POA: Diagnosis not present

## 2018-04-26 DIAGNOSIS — H6980 Other specified disorders of Eustachian tube, unspecified ear: Secondary | ICD-10-CM | POA: Diagnosis not present

## 2018-04-26 DIAGNOSIS — K529 Noninfective gastroenteritis and colitis, unspecified: Secondary | ICD-10-CM | POA: Diagnosis not present

## 2018-04-27 ENCOUNTER — Ambulatory Visit (INDEPENDENT_AMBULATORY_CARE_PROVIDER_SITE_OTHER): Payer: BLUE CROSS/BLUE SHIELD | Admitting: Psychiatry

## 2018-04-27 ENCOUNTER — Encounter: Payer: Self-pay | Admitting: Psychiatry

## 2018-04-27 VITALS — BP 138/84 | HR 103 | Temp 97.9°F | Wt 277.8 lb

## 2018-04-27 DIAGNOSIS — F3161 Bipolar disorder, current episode mixed, mild: Secondary | ICD-10-CM | POA: Diagnosis not present

## 2018-04-27 DIAGNOSIS — F4010 Social phobia, unspecified: Secondary | ICD-10-CM | POA: Diagnosis not present

## 2018-04-27 DIAGNOSIS — F4310 Post-traumatic stress disorder, unspecified: Secondary | ICD-10-CM

## 2018-04-27 MED ORDER — ARIPIPRAZOLE 10 MG PO TABS: 5.0000 mg | ORAL_TABLET | Freq: Every day | ORAL | 0 refills | Status: DC

## 2018-04-27 MED ORDER — DOXEPIN HCL 10 MG PO CAPS: 10.0000 mg | ORAL_CAPSULE | Freq: Every day | ORAL | 0 refills | Status: DC

## 2018-04-27 NOTE — Patient Instructions (Signed)
Aripiprazole tablets What is this medicine? ARIPIPRAZOLE (ay ri PIP ray zole) is an atypical antipsychotic. It is used to treat schizophrenia and bipolar disorder, also known as manic-depression. It is also used to treat Tourette's disorder and some symptoms of autism. This medicine may also be used in combination with antidepressants to treat major depressive disorder. This medicine may be used for other purposes; ask your health care provider or pharmacist if you have questions. COMMON BRAND NAME(S): Abilify What should I tell my health care provider before I take this medicine? They need to know if you have any of these conditions: -dehydration -dementia -diabetes -heart disease -history of stroke -low blood counts, like low white cell, platelet, or red cell counts -Parkinson's disease -seizures -suicidal thoughts, plans, or attempt; a previous suicide attempt by you or a family member -an unusual or allergic reaction to aripiprazole, other medicines, foods, dyes, or preservatives -pregnant or trying to get pregnant -breast-feeding How should I use this medicine? Take this medicine by mouth with a glass of water. Follow the directions on the prescription label. You can take this medicine with or without food. Take your doses at regular intervals. Do not take your medicine more often than directed. Do not stop taking except on the advice of your doctor or health care professional. A special MedGuide will be given to you by the pharmacist with each prescription and refill. Be sure to read this information carefully each time. Talk to your pediatrician regarding the use of this medicine in children. While this drug may be prescribed for children as young as 6 years of age for selected conditions, precautions do apply. Overdosage: If you think you have taken too much of this medicine contact a poison control center or emergency room at once. NOTE: This medicine is only for you. Do not share  this medicine with others. What if I miss a dose? If you miss a dose, take it as soon as you can. If it is almost time for your next dose, take only that dose. Do not take double or extra doses. What may interact with this medicine? Do not take this medicine with any of the following medications: -brexpiprazole -cisapride -dofetilide -dronedarone -metoclopramide -pimozide -thioridazine This medicine may also interact with the following medications: -alcohol -carbamazepine -certain medicines for anxiety or sleep -certain medicines for blood pressure -certain medicines for fungal infections like ketoconazole, fluconazole, posaconazole, and itraconazole -clarithromycin -fluoxetine -other medicines that prolong the QT interval (cause an abnormal heart rhythm) -paroxetine -quinidine -rifampin This list may not describe all possible interactions. Give your health care provider a list of all the medicines, herbs, non-prescription drugs, or dietary supplements you use. Also tell them if you smoke, drink alcohol, or use illegal drugs. Some items may interact with your medicine. What should I watch for while using this medicine? Visit your doctor or health care professional for regular checks on your progress. It may be several weeks before you see the full effects of this medicine. Do not suddenly stop taking this medicine. You may need to gradually reduce the dose. Patients and their families should watch out for worsening depression or thoughts of suicide. Also watch out for sudden changes in feelings such as feeling anxious, agitated, panicky, irritable, hostile, aggressive, impulsive, severely restless, overly excited and hyperactive, or not being able to sleep. If this happens, especially at the beginning of antidepressant treatment or after a change in dose, call your health care professional. You may get dizzy or drowsy. Do   not drive, use machinery, or do anything that needs mental  alertness until you know how this medicine affects you. Do not stand or sit up quickly, especially if you are an older patient. This reduces the risk of dizzy or fainting spells. Alcohol can increase dizziness and drowsiness. Avoid alcoholic drinks. This medicine can reduce the response of your body to heat or cold. Dress warm in cold weather and stay hydrated in hot weather. If possible, avoid extreme temperatures like saunas, hot tubs, very hot or cold showers, or activities that can cause dehydration such as vigorous exercise. This medicine may cause dry eyes and blurred vision. If you wear contact lenses you may feel some discomfort. Lubricating drops may help. See your eye doctor if the problem does not go away or is severe. If you notice an increased hunger or thirst, different from your normal hunger or thirst, or if you find that you have to urinate more frequently, you should contact your health care provider as soon as possible. You may need to have your blood sugar monitored. This medicine may cause changes in your blood sugar levels. You should monitor you blood sugar frequently if you have diabetes. There have been reports of uncontrollable and strong urges to gamble, binge eat, shop, and have sex while taking this medicine. If you experience any of these or other uncontrollable and strong urges while taking this medicine, you should report it to your health care provider as soon as possible. What side effects may I notice from receiving this medicine? Side effects that you should report to your doctor or health care professional as soon as possible: -allergic reactions like skin rash, itching or hives, swelling of the face, lips, or tongue -breathing problems -confusion -feeling faint or lightheaded, falls -fever or chills, sore throat -increased hunger or thirst -increased urination -joint pain -muscles pain, spasms -problems with balance, talking, walking -restlessness or need to  keep moving -seizures -suicidal thoughts or other mood changes -trouble swallowing -uncontrollable and excessive urges (examples: gambling, binge eating, shopping, having sex) -uncontrollable head, mouth, neck, arm, or leg movements -unusually weak or tired Side effects that usually do not require medical attention (report to your doctor or health care professional if they continue or are bothersome): -blurred vision -constipation -headache -nausea, vomiting -trouble sleeping -weight gain This list may not describe all possible side effects. Call your doctor for medical advice about side effects. You may report side effects to FDA at 1-800-FDA-1088. Where should I keep my medicine? Keep out of the reach of children. Store at room temperature between 15 and 30 degrees C (59 and 86 degrees F). Throw away any unused medicine after the expiration date. NOTE: This sheet is a summary. It may not cover all possible information. If you have questions about this medicine, talk to your doctor, pharmacist, or health care provider.  2018 [employer] Standard (2016-07-07 11:45:05) Doxepin capsules What is this medicine? DOXEPIN (DOX e pin) is used to treat depression and anxiety. This medicine may be used for other purposes; ask your health care provider or pharmacist if you have questions. COMMON BRAND NAME(S): Sinequan What should I tell my health care provider before I take this medicine? They need to know if you have any of these conditions: -bipolar disorder -difficulty passing urine -glaucoma -heart disease -if you frequently drink alcohol containing drinks -liver disease -lung or breathing disease, like asthma or sleep apnea -prostate trouble -schizophrenia -seizures -suicidal thoughts, plans, or attempt; a previous suicide attempt by  you or a family member -an unusual or allergic reaction to doxepin, other medicines, foods, dyes, or preservatives -pregnant or trying to get  pregnant -breast-feeding How should I use this medicine? Take this medicine by mouth with a glass of water. Follow the directions on the prescription label. Take your doses at regular intervals. Do not take your medicine more often than directed. Do not stop taking this medicine suddenly except upon the advice of your doctor. Stopping this medicine too quickly may cause serious side effects or your condition may worsen. A special MedGuide will be given to you by the pharmacist with each prescription and refill. Be sure to read this information carefully each time. Talk to your pediatrician regarding the use of this medicine in children. While this drug may be prescribed for children as young as 12 years for selected conditions, precautions do apply. Overdosage: If you think you have taken too much of this medicine contact a poison control center or emergency room at once. NOTE: This medicine is only for you. Do not share this medicine with others. What if I miss a dose? If you miss a dose, take it as soon as you can. If it is almost time for your next dose, take only that dose. Do not take double or extra doses. What may interact with this medicine? Do not take this medicine with any of the following medications: -arsenic trioxide -certain medicines used to regulate abnormal heartbeat or to treat other heart conditions -cisapride -halofantrine -levomethadyl -linezolid -MAOIs like Carbex, Eldepryl, Marplan, Nardil, and Parnate -methylene blue -other medicines for mental depression -phenothiazines like perphenazine, thioridazine and chlorpromazine -pimozide -procarbazine -sparfloxacin -St. John's Wort -ziprasidone This medicine may also interact with the following medications: -cimetidine -tolazamide This list may not describe all possible interactions. Give your health care provider a list of all the medicines, herbs, non-prescription drugs, or dietary supplements you use. Also tell them  if you smoke, drink alcohol, or use illegal drugs. Some items may interact with your medicine. What should I watch for while using this medicine? Visit your doctor or health care professional for regular checks on your progress. It can take several days before you feel the full effect of this medicine. If you have been taking this medicine regularly for some time, do not suddenly stop taking it. You must gradually reduce the dose or you may get severe side effects. Ask your doctor or health care professional for advice. Even after you stop taking this medicine it can still affect your body for several days. Patients and their families should watch out for new or worsening thoughts of suicide or depression. Also watch out for sudden changes in feelings such as feeling anxious, agitated, panicky, irritable, hostile, aggressive, impulsive, severely restless, overly excited and hyperactive, or not being able to sleep. If this happens, especially at the beginning of treatment or after a change in dose, call your health care professional. Bonita Quin may get drowsy or dizzy. Do not drive, use machinery, or do anything that needs mental alertness until you know how this medicine affects you. Do not stand or sit up quickly, especially if you are an older patient. This reduces the risk of dizzy or fainting spells. Alcohol may increase dizziness and drowsiness. Avoid alcoholic drinks. Do not treat yourself for coughs, colds, or allergies without asking your doctor or health care professional for advice. Some ingredients can increase possible side effects. Your mouth may get dry. Chewing sugarless gum or sucking hard candy, and drinking  plenty of water may help. Contact your doctor if the problem does not go away or is severe. This medicine may cause dry eyes and blurred vision. If you wear contact lenses you may feel some discomfort. Lubricating drops may help. See your eye doctor if the problem does not go away or is  severe. This medicine can make you more sensitive to the sun. Keep out of the sun. If you cannot avoid being in the sun, wear protective clothing and use sunscreen. Do not use sun lamps or tanning beds/booths. What side effects may I notice from receiving this medicine? Side effects that you should report to your doctor or health care professional as soon as possible: -allergic reactions like skin rash, itching or hives, swelling of the face, lips, or tongue -anxious -breathing problems -changes in vision -confusion -elevated mood, decreased need for sleep, racing thoughts, impulsive behavior -eye pain -fast, irregular heartbeat -feeling faint or lightheaded, falls -feeling agitated, angry, or irritable -fever with increased sweating -hallucination, loss of contact with reality -seizures -stiff muscles -suicidal thoughts or other mood changes -tingling, pain, or numbness in the feet or hands -trouble passing urine or change in the amount of urine -trouble sleeping -unusually weak or tired -vomiting -yellowing of the eyes or skin Side effects that usually do not require medical attention (report to your doctor or health care professional if they continue or are bothersome): -change in sex drive or performance -change in appetite or weight -constipation -dizziness -dry mouth -nausea -tired -tremors -upset stomach This list may not describe all possible side effects. Call your doctor for medical advice about side effects. You may report side effects to FDA at 1-800-FDA-1088. Where should I keep my medicine? Keep out of the reach of children. Store at room temperature between 15 and 30 degrees C (59 and 86 degrees F). Throw away any unused medicine after the expiration date. NOTE: This sheet is a summary. It may not cover all possible information. If you have questions about this medicine, talk to your doctor, pharmacist, or health care provider.  2018 [employer] Standard  (2015-12-22 12:35:05)
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
## 2018-04-29 ENCOUNTER — Ambulatory Visit: Payer: BLUE CROSS/BLUE SHIELD | Admitting: Family Medicine

## 2018-04-29 ENCOUNTER — Ambulatory Visit: Payer: Self-pay | Admitting: Primary Care

## 2018-04-29 ENCOUNTER — Encounter: Payer: Self-pay | Admitting: Family Medicine

## 2018-04-29 VITALS — BP 124/80 | HR 114 | Temp 98.2°F | Ht 66.0 in | Wt 277.2 lb

## 2018-04-29 DIAGNOSIS — J111 Influenza due to unidentified influenza virus with other respiratory manifestations: Secondary | ICD-10-CM | POA: Diagnosis not present

## 2018-04-29 DIAGNOSIS — N926 Irregular menstruation, unspecified: Secondary | ICD-10-CM | POA: Diagnosis not present

## 2018-04-29 DIAGNOSIS — R52 Pain, unspecified: Secondary | ICD-10-CM

## 2018-04-29 LAB — POC INFLUENZA A&B (BINAX/QUICKVUE)
Influenza A, POC: POSITIVE — AB
Influenza B, POC: POSITIVE — AB

## 2018-04-29 LAB — POCT URINE PREGNANCY: Preg Test, Ur: NEGATIVE

## 2018-04-29 MED ORDER — OSELTAMIVIR PHOSPHATE 75 MG PO CAPS: 75.0000 mg | ORAL_CAPSULE | Freq: Two times a day (BID) | ORAL | 0 refills | Status: DC

## 2018-04-29 NOTE — Progress Notes (Signed)
BP 124/80 (BP Location: Left Arm, Patient Position: Sitting, Cuff Size: Large)   Pulse (!) 114   Temp 98.2 F (36.8 C) (Oral)   Ht _0  (1.676 m)   Wt 277 lb 4 oz (125.8 kg)   LMP 03/19/2018   SpO2 98%   BMI 44.75 kg/m    CC: dizziness, ear pain Subjective:    Patient ID: Joan [name], female    DOB: 1981/01/07, 37 y.o.   MRN: [id number]  HPI: Joan [name] is a 37 y.o. female presenting on 04/29/2018 for Dizziness (C/o dizziness and bilateral ear pain that started on 04/25/18. Was seen at [facility] on 04/26/18. Told her eardrums were "poineted in" and very tight. Says she had tubes in her ears but they have fallen out. States ear pain is worse and now has nausea, bodyaches and chills. )   5d h/o dizziness described as presyncope and vertigo, cold chills last night. Cough started today. Bilateral earache. Body aches and nausea.   Seen at [facility] over weekend - thought retracted TMs treated with antihistamine nose spray.   She just got over pneumonia - treated with antibiotics and prednisone. (first doxycycline, then zpack).   No congestion. No fevers, ST, PNdrainage, abd pain or vomiting.   No sick contacts at home. Works in nursing home.  Regularly takes benadryl at night.  H/o tympanostomy tubes Has diabetes, hypertension. Known asthmatic uses albuterol PRN. Advair was recently started by pulm Ashby Dawes). On psychiatric meds for MDD.  Lab Results  Component Value Date   HGBA1C 10.1 (H) 02/27/2018     Heart rate runs high. No recent albuterol use.   LMP 03/19/2018 - has had 4 positive pregnancy tests at home.   Relevant past medical, surgical, family and social history reviewed and updated as indicated. Interim medical history since our last visit reviewed. Allergies and medications reviewed and updated. Outpatient Medications Prior to Visit  Medication Sig Dispense Refill  . ARIPiprazole (ABILIFY) 10 MG tablet Take 0.5 tablets (5 mg total) by mouth daily. 15 tablet 0  .  atorvastatin (LIPITOR) 40 MG tablet Take 1 tablet by mouth at bedtime for cholesterol. 90 tablet 1  . blood glucose meter kit and supplies KIT Dispense based on patient and insurance preference. Use up three times daily as directed. (FOR ICD-9 250.00, 250.01). 1 each 0  . citalopram (CELEXA) 20 MG tablet Take 1 tablet (20 mg total) by mouth daily. 90 tablet 0  . doxepin (SINEQUAN) 10 MG capsule Take 1-2 capsules (10-20 mg total) by mouth at bedtime. For sleep 60 capsule 0  . empagliflozin (JARDIANCE) 25 MG TABS tablet Take 25 mg by mouth daily. 90 mg 3  . fluticasone (FLONASE) 50 MCG/ACT nasal spray Place into the nose.    Marland Kitchen Fluticasone-Salmeterol (ADVAIR DISKUS) 250-50 MCG/DOSE AEPB Inhale 1 puff into the lungs 2 (two) times daily. Rinse mouth after use. 1 each 0  . hydrOXYzine (ATARAX/VISTARIL) 50 MG tablet   0  . Insulin Glargine (LANTUS) 100 UNIT/ML Solostar Pen Inject 24 Units into the skin every evening. 15 mL 5  . Insulin Pen Needle (PEN NEEDLES) 31G X 6 MM MISC Use with insulin as directed. 100 each 2  . ipratropium-albuterol (DUONEB) 0.5-2.5 (3) MG/3ML SOLN Take 3 mLs by nebulization every 6 (six) hours as needed. 360 mL 5  . losartan (COZAAR) 100 MG tablet Take 1 tablet by mouth once daily for blood pressure. 90 tablet 3  . metFORMIN (GLUCOPHAGE) 1000 MG tablet Take  1 tablet (1,000 mg total) by mouth 2 (two) times daily with a meal. 180 tablet 3  . montelukast (SINGULAIR) 10 MG tablet Take 1 tablet (10 mg total) by mouth at bedtime. 30 tablet 3  . Spacer/Aero-Holding Chambers (AEROCHAMBER PLUS) inhaler Use as instructed. Generic spacer if possible 1 each 2  . VENTOLIN HFA 108 (90 Base) MCG/ACT inhaler INHALE 2 PUFFS BY MOUTH EVERY 6 HOURS AS NEEDED FOR SHORTNESS OF BREATH AND WHEEZING 18 g 0   No facility-administered medications prior to visit.      Per HPI unless specifically indicated in ROS section below Review of Systems     Objective:    BP 124/80 (BP Location: Left Arm,  Patient Position: Sitting, Cuff Size: Large)   Pulse (!) 114   Temp 98.2 F (36.8 C) (Oral)   Ht _0  (1.676 m)   Wt 277 lb 4 oz (125.8 kg)   LMP 03/19/2018   SpO2 98%   BMI 44.75 kg/m   Wt Readings from Last 3 Encounters:  04/29/18 277 lb 4 oz (125.8 kg)  04/16/18 276 lb (125.2 kg)  04/10/18 273 lb 8 oz (124.1 kg)    Physical Exam  Constitutional: She appears well-developed and well-nourished. No distress.  HENT:  Head: Normocephalic and atraumatic.  Right Ear: Hearing, tympanic membrane, external ear and ear canal normal.  Left Ear: Hearing, tympanic membrane, external ear and ear canal normal.  Nose: No mucosal edema or rhinorrhea. Right sinus exhibits maxillary sinus tenderness. Right sinus exhibits no frontal sinus tenderness. Left sinus exhibits no maxillary sinus tenderness and no frontal sinus tenderness.  Mouth/Throat: Uvula is midline, oropharynx is clear and moist and mucous membranes are normal. No oropharyngeal exudate, posterior oropharyngeal edema, posterior oropharyngeal erythema or tonsillar abscesses.  R ear tube out of TM but remains in canal   Eyes: Pupils are equal, round, and reactive to light. Conjunctivae and EOM are normal. No scleral icterus.  Neck: Normal range of motion. Neck supple.  Cardiovascular: Normal rate, regular rhythm, normal heart sounds and intact distal pulses.  No murmur heard. Pulmonary/Chest: Effort normal and breath sounds normal. No respiratory distress. She has no wheezes. She has no rales.  Lymphadenopathy:    She has no cervical adenopathy.  Neurological: She is alert.  CN grossly intact EOMI  FTN intact  Skin: Skin is warm and dry. No rash noted.  Nursing note and vitals reviewed.  Results for orders placed or performed in visit on 04/29/18  POC Influenza A&B(BINAX/QUICKVUE)  Result Value Ref Range   Influenza A, POC Positive (A) Negative   Influenza B, POC Positive (A) Negative  POCT urine pregnancy  Result Value Ref  Range   Preg Test, Ur Negative Negative      Assessment & Plan:   Problem List Items Addressed This Visit    Missed period    Endorses several positive pregnancy tests at home.  LMP 03/19/2018. Cycles are not regular.  Upreg today negative. Advised monitor for next period - if remains without period, she will f/u with OBGYN.       Relevant Orders   POCT urine pregnancy (Completed)   Influenza - Primary    Flu swab positive for both A and B. Outside of window for maximal benefit for tamiflu but given comorbidities will still treat with tamiflu. Further supportive care reviewed. Out of work until Monday. Update if not improving with treatment. Endorses h/o PNA after influenza in the past. Lungs overall clear - no  signs of asthma exacerbation.       Relevant Medications   oseltamivir (TAMIFLU) 75 MG capsule    Other Visit Diagnoses    Body aches       Relevant Orders   POC Influenza A&B(BINAX/QUICKVUE) (Completed)       Meds ordered this encounter  Medications  . oseltamivir (TAMIFLU) 75 MG capsule    Sig: Take 1 capsule (75 mg total) by mouth 2 (two) times daily.    Dispense:  10 capsule    Refill:  0   Orders Placed This Encounter  Procedures  . POC Influenza A&B(BINAX/QUICKVUE)  . POCT urine pregnancy    Follow up plan: Return if symptoms worsen or fail to improve.  Ria Bush, MD

## 2018-04-29 NOTE — Telephone Encounter (Signed)
Note reviewed from Dr. Sharen Hones, diagnosed with influenza and treated.

## 2018-04-29 NOTE — Assessment & Plan Note (Addendum)
Flu swab positive for both A and B. Outside of window for maximal benefit for tamiflu but given comorbidities will still treat with tamiflu. Further supportive care reviewed. Out of work until Monday. Update if not improving with treatment. Endorses h/o PNA after influenza in the past. Lungs overall clear - no signs of asthma exacerbation.

## 2018-04-29 NOTE — Patient Instructions (Addendum)
Urine pregnancy test today was negative.  Flu swab today returned positive - treat with tamiflu sent to pharmacy. Push fluids and rest. May take tylenol as needed. Work note provided today.  Influenza, Adult Influenza, more commonly known as "the flu," is a viral infection that primarily affects the respiratory tract. The respiratory tract includes organs that help you breathe, such as the lungs, nose, and throat. The flu causes many common cold symptoms, as well as a high fever and body aches. The flu spreads easily from person to person (is contagious). Getting a flu shot (influenza vaccination) every year is the best way to prevent influenza. What are the causes? Influenza is caused by a virus. You can catch the virus by:  Breathing in droplets from an infected person's cough or sneeze.  Touching something that was recently contaminated with the virus and then touching your mouth, nose, or eyes.  What increases the risk? The following factors may make you more likely to get the flu:  Not cleaning your hands frequently with soap and water or alcohol-based hand sanitizer.  Having close contact with many people during cold and flu season.  Touching your mouth, eyes, or nose without washing or sanitizing your hands first.  Not drinking enough fluids or not eating a healthy diet.  Not getting enough sleep or exercise.  Being under a high amount of stress.  Not getting a yearly (annual) flu shot.  You may be at a higher risk of complications from the flu, such as a severe lung infection (pneumonia), if you:  Are over the age of 38.  Are pregnant.  Have a weakened disease-fighting system (immune system). You may have a weakened immune system if you: ? Have HIV or AIDS. ? Are undergoing chemotherapy. ? Aretaking medicines that reduce the activity of (suppress) the immune system.  Have a long-term (chronic) illness, such as heart disease, kidney disease, diabetes, or lung  disease.  Have a liver disorder.  Are obese.  Have anemia.  What are the signs or symptoms? Symptoms of this condition typically last 4-10 days and may include:  Fever.  Chills.  Headache, body aches, or muscle aches.  Sore throat.  Cough.  Runny or congested nose.  Chest discomfort and cough.  Poor appetite.  Weakness or tiredness (fatigue).  Dizziness.  Nausea or vomiting.  How is this diagnosed? This condition may be diagnosed based on your medical history and a physical exam. Your health care provider may do a nose or throat swab test to confirm the diagnosis. How is this treated? If influenza is detected early, you can be treated with antiviral medicine that can reduce the length of your illness and the severity of your symptoms. This medicine may be given by mouth (orally) or through an IV tube that is inserted in one of your veins. The goal of treatment is to relieve symptoms by taking care of yourself at home. This may include taking over-the-counter medicines, drinking plenty of fluids, and adding humidity to the air in your home. In some cases, influenza goes away on its own. Severe influenza or complications from influenza may be treated in a hospital. Follow these instructions at home:  Take over-the-counter and prescription medicines only as told by your health care provider.  Use a cool mist humidifier to add humidity to the air in your home. This can make breathing easier.  Rest as needed.  Drink enough fluid to keep your urine clear or pale yellow.  Cover your  mouth and nose when you cough or sneeze.  Wash your hands with soap and water often, especially after you cough or sneeze. If soap and water are not available, use hand sanitizer.  Stay home from work or school as told by your health care provider. Unless you are visiting your health care provider, try to avoid leaving home until your fever has been gone for 24 hours without the use of  medicine.  Keep all follow-up visits as told by your health care provider. This is important. How is this prevented?  Getting an annual flu shot is the best way to avoid getting the flu. You may get the flu shot in late summer, fall, or winter. Ask your health care provider when you should get your flu shot.  Wash your hands often or use hand sanitizer often.  Avoid contact with people who are sick during cold and flu season.  Eat a healthy diet, drink plenty of fluids, get enough sleep, and exercise regularly. Contact a health care provider if:  You develop new symptoms.  You have: ? Chest pain. ? Diarrhea. ? A fever.  Your cough gets worse.  You produce more mucus.  You feel nauseous or you vomit. Get help right away if:  You develop shortness of breath or difficulty breathing.  Your skin or nails turn a bluish color.  You have severe pain or stiffness in your neck.  You develop a sudden headache or sudden pain in your face or ear.  You cannot stop vomiting. This information is not intended to replace advice given to you by your health care provider. Make sure you discuss any questions you have with your health care provider. Document Released: 07/19/2000 Document Revised: 12/28/2015 Document Reviewed: 05/16/2015 Elsevier Interactive Patient Education  2017 [employer].

## 2018-04-29 NOTE — Telephone Encounter (Signed)
She called in c/o dizziness and nausea since Saturday.   She mentioned she has done a home pregnancy test for 4 days in a row and they were all positive.   She was seen at the urgent care on Sunday and the doctor told her her eardrums were sunken in and tight.  She is c/o her ears hurting.   The doctor gave her an antihistamine nasal spray which is not helping.   See triage notes.  I scheduled her with Dr. Sharen Hones for today at 10:15.   Reason for Disposition . [1] MODERATE dizziness (e.g., interferes with normal activities) AND [2] has NOT been evaluated by physician for this  (Exception: dizziness caused by heat exposure, sudden standing, or poor fluid intake)  Answer Assessment - Initial Assessment Questions 1. DESCRIPTION: "Describe your dizziness."     When I get dizzy I feel like I'm going to pass out and I get nauseated.   I'm having chills and a cold sweat.   I've have not checked my temperature. 2. LIGHTHEADED: "Do you feel lightheaded?" (e.G., somewhat faint, woozy, weak upon standing)     See above. 3. VERTIGO: "Do you feel like either you or the room is spinning or tilting?" (i.e. vertigo)     It's feels like everything is spinning around me and I'm nauseated on my stomach. 4. SEVERITY: "How bad is it?"  "Do you feel like you are going to faint?" "Can you stand and walk?"   - MILD - walking normally   - MODERATE - interferes with normal activities (e.G., work, school)    - SEVERE - unable to stand, requires support to walk, feels like passing out now.      I did a home pregnancy test and it's positive.  Done the last 4 days in a row and they are all positive. 5. ONSET:  "When did the dizziness begin?"     Started Saturday.   I went to Urgent Care on Sunday.   He gave me a nasal spray but it's not working. 6. AGGRAVATING FACTORS: "Does anything make it worse?" (e.G., standing, change in head position)     It's getting worse with time. 7. HEART RATE: "Can you tell me your heart  rate?" "How many beats in 15 seconds?"  (Note: not all patients can do this)       Not asked 8. CAUSE: "What do you think is causing the dizziness?"     I have no idea.   9. RECURRENT SYMPTOM: "Have you had dizziness before?" If so, ask: "When was the last time?" "What happened that time?"     No 10. OTHER SYMPTOMS: "Do you have any other symptoms?" (e.G., fever, chest pain, vomiting, diarrhea, bleeding)       Nausea only 11. PREGNANCY: "Is there any chance you are pregnant?" "When was your last menstrual period?"       Yes My pregnancy test is positive.   See above.  Protocols used: DIZZINESS [facility]

## 2018-04-29 NOTE — Assessment & Plan Note (Signed)
Endorses several positive pregnancy tests at home.  LMP 03/19/2018. Cycles are not regular.  Upreg today negative. Advised monitor for next period - if remains without period, she will f/u with OBGYN.

## 2018-05-04 ENCOUNTER — Encounter: Payer: Self-pay | Admitting: Family Medicine

## 2018-05-04 ENCOUNTER — Ambulatory Visit: Payer: BLUE CROSS/BLUE SHIELD | Admitting: Family Medicine

## 2018-05-04 VITALS — BP 128/86 | HR 115 | Temp 98.0°F | Ht 66.0 in | Wt 273.8 lb

## 2018-05-04 DIAGNOSIS — J111 Influenza due to unidentified influenza virus with other respiratory manifestations: Secondary | ICD-10-CM

## 2018-05-04 DIAGNOSIS — J019 Acute sinusitis, unspecified: Secondary | ICD-10-CM

## 2018-05-04 DIAGNOSIS — R000 Tachycardia, unspecified: Secondary | ICD-10-CM

## 2018-05-04 MED ORDER — DOXYCYCLINE HYCLATE 100 MG PO TABS: 100.0000 mg | ORAL_TABLET | Freq: Two times a day (BID) | ORAL | 0 refills | Status: DC

## 2018-05-04 NOTE — Progress Notes (Signed)
BP 128/86 (BP Location: Left Arm, Patient Position: Sitting, Cuff Size: Large)   Pulse (!) 115   Temp 98 F (36.7 C) (Oral)   Ht _0  (1.676 m)   Wt 273 lb 12 oz (124.2 kg)   LMP 03/19/2018   SpO2 98%   BMI 44.18 kg/m    CC: influenza f/u Subjective:    Patient ID: Joan [name], female    DOB: 03-27-1981, 37 y.o.   MRN: [id number]  HPI: Joan [name] is a 37 y.o. female presenting on 05/04/2018 for Follow-up (Pt was dx with flu on 04/29/18.  Felt better on 05/01/18, then started feeling bad again on 05/03/18. C/o nausea and dizziness again. )   See prior note for details - seen here 9/25 with 5 days of dizziness/vertigo, cold chills and cough - dx flu (positive flu A and B swabs) treated with tamiflu 5d course. Symptoms did improve and she felt well over weekend, then last night while showering again felt dizzy, presyncopal and nauseated, and chills and body aches recurring. Some R earache. No LOC. No significant nasal congestion, headache. No tooth pain. No fevers.   Recent pneumonia treated with doxy/zpack and prednisone.  Heart rate runs high. She tries to stay well hydrated - 16 oz water bottle several times a day.   Asthma - needed nebulizer treatment saturday night for dyspnea/wheezing - now better.  DM - on jardiance 49m daily, lantus 24u daily, and metformin 10047mbid. Morning fasting 150-160.   LMP - 03/19/2018 - not regular - h/o PCOS. Has had positive pregnancy tests but had negative in our office - has f/u with OBGYN on Thursday.   Relevant past medical, surgical, family and social history reviewed and updated as indicated. Interim medical history since our last visit reviewed. Allergies and medications reviewed and updated. Outpatient Medications Prior to Visit  Medication Sig Dispense Refill  . ARIPiprazole (ABILIFY) 10 MG tablet Take 0.5 tablets (5 mg total) by mouth daily. 15 tablet 0  . atorvastatin (LIPITOR) 40 MG tablet Take 1 tablet by mouth at bedtime  for cholesterol. 90 tablet 1  . blood glucose meter kit and supplies KIT Dispense based on patient and insurance preference. Use up three times daily as directed. (FOR ICD-9 250.00, 250.01). 1 each 0  . citalopram (CELEXA) 20 MG tablet Take 1 tablet (20 mg total) by mouth daily. 90 tablet 0  . doxepin (SINEQUAN) 10 MG capsule Take 1-2 capsules (10-20 mg total) by mouth at bedtime. For sleep 60 capsule 0  . empagliflozin (JARDIANCE) 25 MG TABS tablet Take 25 mg by mouth daily. 90 mg 3  . fluticasone (FLONASE) 50 MCG/ACT nasal spray Place into the nose.    . Marland Kitchenluticasone-Salmeterol (ADVAIR DISKUS) 250-50 MCG/DOSE AEPB Inhale 1 puff into the lungs 2 (two) times daily. Rinse mouth after use. 1 each 0  . hydrOXYzine (ATARAX/VISTARIL) 50 MG tablet   0  . Insulin Glargine (LANTUS) 100 UNIT/ML Solostar Pen Inject 24 Units into the skin every evening. 15 mL 5  . Insulin Pen Needle (PEN NEEDLES) 31G X 6 MM MISC Use with insulin as directed. 100 each 2  . ipratropium-albuterol (DUONEB) 0.5-2.5 (3) MG/3ML SOLN Take 3 mLs by nebulization every 6 (six) hours as needed. 360 mL 5  . losartan (COZAAR) 100 MG tablet Take 1 tablet by mouth once daily for blood pressure. 90 tablet 3  . metFORMIN (GLUCOPHAGE) 1000 MG tablet Take 1 tablet (1,000 mg total) by mouth 2 (two)  times daily with a meal. 180 tablet 3  . montelukast (SINGULAIR) 10 MG tablet Take 1 tablet (10 mg total) by mouth at bedtime. 30 tablet 3  . oseltamivir (TAMIFLU) 75 MG capsule Take 1 capsule (75 mg total) by mouth 2 (two) times daily. 10 capsule 0  . Spacer/Aero-Holding Chambers (AEROCHAMBER PLUS) inhaler Use as instructed. Generic spacer if possible 1 each 2  . VENTOLIN HFA 108 (90 Base) MCG/ACT inhaler INHALE 2 PUFFS BY MOUTH EVERY 6 HOURS AS NEEDED FOR SHORTNESS OF BREATH AND WHEEZING 18 g 0   No facility-administered medications prior to visit.      Per HPI unless specifically indicated in ROS section below Review of Systems     Objective:      BP 128/86 (BP Location: Left Arm, Patient Position: Sitting, Cuff Size: Large)   Pulse (!) 115   Temp 98 F (36.7 C) (Oral)   Ht _0  (1.676 m)   Wt 273 lb 12 oz (124.2 kg)   LMP 03/19/2018   SpO2 98%   BMI 44.18 kg/m   Wt Readings from Last 3 Encounters:  05/04/18 273 lb 12 oz (124.2 kg)  04/29/18 277 lb 4 oz (125.8 kg)  04/16/18 276 lb (125.2 kg)    Physical Exam  Constitutional: She appears well-developed and well-nourished. No distress.  HENT:  Head: Normocephalic and atraumatic.  Right Ear: Hearing, tympanic membrane, external ear and ear canal normal.  Left Ear: Hearing, tympanic membrane, external ear and ear canal normal.  Nose: Mucosal edema and rhinorrhea present. Right sinus exhibits maxillary sinus tenderness. Right sinus exhibits no frontal sinus tenderness. Left sinus exhibits no maxillary sinus tenderness and no frontal sinus tenderness.  Mouth/Throat: Uvula is midline, oropharynx is clear and moist and mucous membranes are normal. No oropharyngeal exudate, posterior oropharyngeal edema, posterior oropharyngeal erythema or tonsillar abscesses.  Eyes: Pupils are equal, round, and reactive to light. Conjunctivae and EOM are normal. No scleral icterus.  Neck: Normal range of motion. Neck supple.  Cardiovascular: Normal rate, regular rhythm, normal heart sounds and intact distal pulses.  No murmur heard. Pulmonary/Chest: Effort normal and breath sounds normal. No respiratory distress. She has no wheezes. She has no rales.  Lungs clear  Musculoskeletal: She exhibits no edema.  Lymphadenopathy:    She has no cervical adenopathy.  Skin: Skin is warm and dry. No rash noted.  Nursing note and vitals reviewed.  Results for orders placed or performed in visit on 04/29/18  POC Influenza A&B(BINAX/QUICKVUE)  Result Value Ref Range   Influenza A, POC Positive (A) Negative   Influenza B, POC Positive (A) Negative  POCT urine pregnancy  Result Value Ref Range   Preg  Test, Ur Negative Negative      Assessment & Plan:   Problem List Items Addressed This Visit    Tachycardia    Rapid heart rate noted today - h/o this. Encouraged increased water intake to ensure good hydration status.       Influenza    Initially improved after tamiflu treatment, now acute worsening again.       Acute sinusitis - Primary    After initial influenza last week - concern for acute bacterial sinusitis. Will treat with doxycycline 10d course and tylenol for discomfort. Pt cannot take NSAIDs due to allergy. Avoid prednisone in h/o DM. Update if not improving with treatment. She will let me know if needs to stay out of work Wednesday      Relevant Medications  doxycycline (VIBRA-TABS) 100 MG tablet       Meds ordered this encounter  Medications  . doxycycline (VIBRA-TABS) 100 MG tablet    Sig: Take 1 tablet (100 mg total) by mouth 2 (two) times daily.    Dispense:  20 tablet    Refill:  0   No orders of the defined types were placed in this encounter.   Follow up plan: Return if symptoms worsen or fail to improve.  Ria Bush, MD

## 2018-05-04 NOTE — Assessment & Plan Note (Signed)
Initially improved after tamiflu treatment, now acute worsening again.

## 2018-05-04 NOTE — Assessment & Plan Note (Signed)
After initial influenza last week - concern for acute bacterial sinusitis. Will treat with doxycycline 10d course and tylenol for discomfort. Pt cannot take NSAIDs due to allergy. Avoid prednisone in h/o DM. Update if not improving with treatment. She will let me know if needs to stay out of work Wednesday

## 2018-05-04 NOTE — Patient Instructions (Addendum)
I think you may have sinus infection - treat with doxycycline for 10 days - take with food. Increase water intake - dehydration can have similar symptoms.  May take tylenol. Continue flonase, add nasal saline rinse.  Let us know how you do with this.

## 2018-05-04 NOTE — Assessment & Plan Note (Signed)
Rapid heart rate noted today - h/o this. Encouraged increased water intake to ensure good hydration status.

## 2018-05-05 ENCOUNTER — Ambulatory Visit: Payer: BLUE CROSS/BLUE SHIELD | Admitting: Licensed Clinical Social Worker

## 2018-05-07 DIAGNOSIS — Z1159 Encounter for screening for other viral diseases: Secondary | ICD-10-CM | POA: Diagnosis not present

## 2018-05-07 DIAGNOSIS — Z20828 Contact with and (suspected) exposure to other viral communicable diseases: Secondary | ICD-10-CM | POA: Diagnosis not present

## 2018-05-07 DIAGNOSIS — R509 Fever, unspecified: Secondary | ICD-10-CM | POA: Diagnosis not present

## 2018-05-08 ENCOUNTER — Encounter: Payer: Self-pay | Admitting: Family Medicine

## 2018-05-08 ENCOUNTER — Ambulatory Visit (INDEPENDENT_AMBULATORY_CARE_PROVIDER_SITE_OTHER)
Admission: RE | Admit: 2018-05-08 | Discharge: 2018-05-08 | Disposition: A | Discharge status: Home / Self Care | Payer: BLUE CROSS/BLUE SHIELD | Source: Ambulatory Visit | Attending: Family Medicine | Admitting: Family Medicine

## 2018-05-08 ENCOUNTER — Ambulatory Visit: Payer: BLUE CROSS/BLUE SHIELD | Admitting: Family Medicine

## 2018-05-08 VITALS — BP 142/84 | HR 113 | Temp 98.4°F | Ht 66.0 in | Wt 276.0 lb

## 2018-05-08 DIAGNOSIS — J069 Acute upper respiratory infection, unspecified: Secondary | ICD-10-CM

## 2018-05-08 DIAGNOSIS — R509 Fever, unspecified: Secondary | ICD-10-CM

## 2018-05-08 DIAGNOSIS — R05 Cough: Secondary | ICD-10-CM | POA: Diagnosis not present

## 2018-05-08 LAB — CBC WITH DIFFERENTIAL/PLATELET
Basophils Absolute: 0 10*3/uL (ref 0.0–0.1)
Basophils Relative: 0.7 % (ref 0.0–3.0)
Eosinophils Absolute: 0.1 10*3/uL (ref 0.0–0.7)
Eosinophils Relative: 2.3 % (ref 0.0–5.0)
HCT: 44.5 % (ref 36.0–46.0)
Hemoglobin: 14.6 g/dL (ref 12.0–15.0)
Lymphocytes Relative: 26.6 % (ref 12.0–46.0)
Lymphs Abs: 1.6 10*3/uL (ref 0.7–4.0)
MCHC: 32.8 g/dL (ref 30.0–36.0)
MCV: 85.5 fl (ref 78.0–100.0)
Monocytes Absolute: 0.4 10*3/uL (ref 0.1–1.0)
Monocytes Relative: 6.5 % (ref 3.0–12.0)
Neutro Abs: 3.9 10*3/uL (ref 1.4–7.7)
Neutrophils Relative %: 63.9 % (ref 43.0–77.0)
Platelets: 329 10*3/uL (ref 150.0–400.0)
RBC: 5.21 Mil/uL — ABNORMAL HIGH (ref 3.87–5.11)
RDW: 13.6 % (ref 11.5–15.5)
WBC: 6.1 10*3/uL (ref 4.0–10.5)

## 2018-05-08 LAB — COMPREHENSIVE METABOLIC PANEL
ALT: 18 U/L (ref 0–35)
AST: 15 U/L (ref 0–37)
Albumin: 4.1 g/dL (ref 3.5–5.2)
Alkaline Phosphatase: 108 U/L (ref 39–117)
BUN: 10 mg/dL (ref 6–23)
CO2: 22 mEq/L (ref 19–32)
Calcium: 9.7 mg/dL (ref 8.4–10.5)
Chloride: 101 mEq/L (ref 96–112)
Creatinine, Ser: 0.79 mg/dL (ref 0.40–1.20)
GFR: 86.26 mL/min (ref >60.00)
Glucose, Bld: 332 mg/dL — ABNORMAL HIGH (ref 70–99)
Potassium: 4.1 mEq/L (ref 3.5–5.1)
Sodium: 135 mEq/L (ref 135–145)
Total Bilirubin: 0.4 mg/dL (ref 0.2–1.2)
Total Protein: 7.4 g/dL (ref 6.0–8.3)

## 2018-05-08 LAB — MONONUCLEOSIS SCREEN: Mono Screen: NEGATIVE

## 2018-05-08 LAB — TSH: TSH: 1.82 u[IU]/mL (ref 0.35–4.50)

## 2018-05-08 NOTE — Progress Notes (Signed)
Subjective:    Patient ID: Joan [name], female    DOB: [birth year], 37 y.o.   MRN: [id number]  HPI Here for cough and congestion - ongoing for 2 months   37 yo pt of NP [facility] with flu like symptoms   Was dx with flu 9/25 and then felt better and got worse  Was pos flu A and B swabs tx with tamiflu  Also had recent pneumonia -tx with doxy and zpak and prednisone   She was dx with acute sinusitis tx with doxy  Was re tx yesterday - [facility]    Has asthma and DM Also allergies    Pulse Readings from Last 3 Encounters:  05/08/18 (!) 113  05/04/18 (!) 115  04/29/18 (!) 114   BP Readings from Last 3 Encounters:  05/08/18 (!) 142/84  05/04/18 128/86  04/29/18 124/80   Temp Readings from Last 3 Encounters:  05/08/18 98.4 F (36.9 C) (Oral)  05/04/18 98 F (36.7 C) (Oral)  04/29/18 98.2 F (36.8 C) (Oral)   Symptoms today  Body aches and chills (temp at home 99) , also aches  Took tylenol this am - 6:30 this am  Hoarse  Fatigue  Headache  No facial pain  No rash  No tick bites  Cough - is dry /not prod  Congestion - clear d/c  Some wheezing   Taking tylenol flonase   tamiflu makes her nauseated but finished it   Due to f/u with ENT for her ear tubes- fallen out    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 Fever and chills 05/08/2018  . Upper respiratory infection 05/08/2018  . Acute sinusitis 05/04/2018  . Tachycardia 05/04/2018  . Influenza 04/29/2018  . Missed period 04/29/2018  . Asthma 04/10/2018  . RLQ abdominal pain 02/13/2018  . BV (bacterial vaginosis) 02/13/2018  . Type 2 diabetes mellitus without complication, without long-term current use of insulin ([place]) 11/24/2017  . Essential hypertension 11/24/2017  . Hyperlipidemia 11/24/2017  . Major depressive disorder, recurrent, severe without psychotic features ([place])   . PTSD (post-traumatic  stress disorder) 04/11/2015   Past Medical History:  Diagnosis Date  . Auditory hallucinations   . Diabetes mellitus without complication ([facility])    diet controlled  . Diabetes mellitus, type II ([place])   . Essential hypertension   . Essential hypertension 11/24/2017  . MDD (major depressive disorder)   . Miscarriage   . PTSD (post-traumatic stress disorder)    Past Surgical History:  Procedure Laterality Date  . ADENOIDECTOMY    . CHOLECYSTECTOMY  2009  . OVARY SURGERY    . TONSILLECTOMY    . tubes in ear     Social History   Tobacco Use  . Smoking status: Never Smoker  . Smokeless tobacco: Never Used  Substance Use Topics  . Alcohol use: No  . Drug use: No   Family History  Problem Relation Age of Onset  . Asthma Mother   . Diabetes Mother   . Hyperlipidemia Mother   . Hypertension Mother   . Diabetes Father   . Hyperlipidemia Father   . Hypertension Father   . Diabetes Brother   . Depression Brother   . Alcohol abuse Brother   . Depression Maternal Grandmother   . Stroke Paternal Grandmother    Allergies  Allergen Reactions  . Ciprofloxacin Hives  and Rash  . Ibuprofen Swelling    Facial  Other reaction(s): Other, Other (See Comments) Whole face swelling Facial  Facial    . Penicillins Hives and Rash    Has patient had a PCN reaction causing immediate rash, facial/tongue/throat swelling, SOB or lightheadedness with hypotension: No Has patient had a PCN reaction causing severe rash involving mucus membranes or skin necrosis: No Has patient had a PCN reaction that required hospitalization: No Has patient had a PCN reaction occurring within the last 10 years: No If all of the above answers are "NO", then may proceed with Cephalosporin use. THE PATIENT IS ABLE TO TOLERATE CEPHALOSPORINS WITHOUT DIFFIC  . Sulfa Antibiotics Hives and Rash   Current Outpatient Medications on File Prior to Visit  Medication Sig Dispense Refill  . ARIPiprazole (ABILIFY) 10 MG  tablet Take 0.5 tablets (5 mg total) by mouth daily. 15 tablet 0  . atorvastatin (LIPITOR) 40 MG tablet Take 1 tablet by mouth at bedtime for cholesterol. 90 tablet 1  . blood glucose meter kit and supplies KIT Dispense based on patient and insurance preference. Use up three times daily as directed. (FOR ICD-9 250.00, 250.01). 1 each 0  . citalopram (CELEXA) 20 MG tablet Take 1 tablet (20 mg total) by mouth daily. 90 tablet 0  . doxepin (SINEQUAN) 10 MG capsule Take 1-2 capsules (10-20 mg total) by mouth at bedtime. For sleep 60 capsule 0  . doxycycline (VIBRA-TABS) 100 MG tablet Take 1 tablet (100 mg total) by mouth 2 (two) times daily. 20 tablet 0  . empagliflozin (JARDIANCE) 25 MG TABS tablet Take 25 mg by mouth daily. 90 mg 3  . fluticasone (FLONASE) 50 MCG/ACT nasal spray Place into the nose.    Marland Kitchen Fluticasone-Salmeterol (ADVAIR DISKUS) 250-50 MCG/DOSE AEPB Inhale 1 puff into the lungs 2 (two) times daily. Rinse mouth after use. 1 each 0  . hydrOXYzine (ATARAX/VISTARIL) 50 MG tablet   0  . Insulin Glargine (LANTUS) 100 UNIT/ML Solostar Pen Inject 24 Units into the skin every evening. 15 mL 5  . Insulin Pen Needle (PEN NEEDLES) 31G X 6 MM MISC Use with insulin as directed. 100 each 2  . ipratropium-albuterol (DUONEB) 0.5-2.5 (3) MG/3ML SOLN Take 3 mLs by nebulization every 6 (six) hours as needed. 360 mL 5  . losartan (COZAAR) 100 MG tablet Take 1 tablet by mouth once daily for blood pressure. 90 tablet 3  . metFORMIN (GLUCOPHAGE) 1000 MG tablet Take 1 tablet (1,000 mg total) by mouth 2 (two) times daily with a meal. 180 tablet 3  . montelukast (SINGULAIR) 10 MG tablet Take 1 tablet (10 mg total) by mouth at bedtime. 30 tablet 3  . Spacer/Aero-Holding Chambers (AEROCHAMBER PLUS) inhaler Use as instructed. Generic spacer if possible 1 each 2  . VENTOLIN HFA 108 (90 Base) MCG/ACT inhaler INHALE 2 PUFFS BY MOUTH EVERY 6 HOURS AS NEEDED FOR SHORTNESS OF BREATH AND WHEEZING 18 g 0   No current  facility-administered medications on file prior to visit.     Review of Systems  Constitutional: Positive for appetite change, chills, diaphoresis and fatigue. Negative for fever.       Low grade temp  Body aches   HENT: Positive for congestion, postnasal drip, rhinorrhea, sinus pressure, sneezing, sore throat and voice change. Negative for ear pain and sinus pain.   Eyes: Negative for pain and discharge.  Respiratory: Positive for cough. Negative for shortness of breath, wheezing and stridor.   Cardiovascular: Negative for chest  pain.  Gastrointestinal: Negative for diarrhea, nausea and vomiting.  Genitourinary: Negative for frequency, hematuria and urgency.  Musculoskeletal: Positive for neck pain. Negative for arthralgias and myalgias.  Skin: Negative for rash.  Neurological: Positive for headaches. Negative for dizziness, weakness and light-headedness.  Psychiatric/Behavioral: Negative for confusion and dysphoric mood.       Objective:   Physical Exam  Constitutional: She appears well-developed and well-nourished. No distress.  Obese and fatigued appearing   HENT:  Head: Normocephalic and atraumatic.  Right Ear: External ear normal.  Left Ear: External ear normal.  Mouth/Throat: Oropharynx is clear and moist.  Nares are injected and congested  No sinus tenderness Clear rhinorrhea and post nasal drip   Eyes: Pupils are equal, round, and reactive to light. Conjunctivae and EOM are normal. Right eye exhibits no discharge. Left eye exhibits no discharge.  Neck: Normal range of motion. Neck supple. No neck rigidity. Normal range of motion present. No Brudzinski's sign and no Kernig's sign noted.  Cardiovascular: Normal rate and normal heart sounds.  Pulmonary/Chest: Effort normal and breath sounds normal. No stridor. No respiratory distress. She has no wheezes. She has no rales. She exhibits no tenderness.  Good air exch No wheeze or stridor occ hacking cough  No rales/rhonchi    Abdominal: Soft. Bowel sounds are normal. She exhibits no distension and no mass. There is no tenderness. There is no guarding.  Lymphadenopathy:    She has no cervical adenopathy.  Neurological: She is alert. No cranial nerve deficit. Coordination normal.  Skin: Skin is warm and dry. No rash noted. No pallor.  No insect bites or rash  Psychiatric: She has a normal mood and affect.          Assessment & Plan:   Problem List Items Addressed This Visit      Respiratory   Upper respiratory infection    Viral syndrome with recent influenza tx with tamiflu (which she tolerated poorly but finished)  Low grade temp /body aches and headache (some neck pain)  Reassuring exam  Pt states she is sick all the time/ cannot get better  Lab today including tick labs  cxr today  Recommend sympt care and analgesics and fluids If severe HA or neck stiffness-recommend ED (she voiced understanding)  Update if not starting to improve in a week or if worsening        Relevant Orders   DG Chest 2 View (Completed)     Other   Fever and chills - Primary    Suspect viral syndrome - but labs done due to length of illness as well as cxr      Relevant Orders   CBC with Differential/Platelet (Completed)   Comprehensive metabolic panel (Completed)   TSH (Completed)   Rocky mtn spotted fvr abs pnl(IgG+IgM)   Mononucleosis screen (Completed)   B. [place]dorfi Antibodies   DG Chest 2 View (Completed)

## 2018-05-08 NOTE — Patient Instructions (Signed)
Take tylenol and rest and drink lots of fluids and watch blood sugar   If headache becomes severe or your neck gets stiff - go to the ER  Labs today  Chest xray today   mucinex is over the counter is ok for congestion and cough   Update if not starting to improve in a week or if worsening

## 2018-05-10 ENCOUNTER — Emergency Department: Payer: BLUE CROSS/BLUE SHIELD

## 2018-05-10 ENCOUNTER — Other Ambulatory Visit: Payer: Self-pay

## 2018-05-10 ENCOUNTER — Encounter: Payer: Self-pay | Admitting: Family Medicine

## 2018-05-10 ENCOUNTER — Encounter: Payer: Self-pay | Admitting: Emergency Medicine

## 2018-05-10 ENCOUNTER — Emergency Department
Admission: EM | Admit: 2018-05-10 | Discharge: 2018-05-10 | Disposition: A | Discharge status: Home / Self Care | Payer: BLUE CROSS/BLUE SHIELD | Attending: Emergency Medicine | Admitting: Emergency Medicine

## 2018-05-10 DIAGNOSIS — R509 Fever, unspecified: Secondary | ICD-10-CM | POA: Diagnosis not present

## 2018-05-10 DIAGNOSIS — R0602 Shortness of breath: Secondary | ICD-10-CM | POA: Diagnosis not present

## 2018-05-10 DIAGNOSIS — E119 Type 2 diabetes mellitus without complications: Secondary | ICD-10-CM | POA: Insufficient documentation

## 2018-05-10 DIAGNOSIS — M7918 Myalgia, other site: Secondary | ICD-10-CM | POA: Insufficient documentation

## 2018-05-10 DIAGNOSIS — R51 Headache: Secondary | ICD-10-CM | POA: Diagnosis not present

## 2018-05-10 DIAGNOSIS — Z79899 Other long term (current) drug therapy: Secondary | ICD-10-CM | POA: Insufficient documentation

## 2018-05-10 DIAGNOSIS — M542 Cervicalgia: Secondary | ICD-10-CM | POA: Diagnosis not present

## 2018-05-10 DIAGNOSIS — Z794 Long term (current) use of insulin: Secondary | ICD-10-CM | POA: Insufficient documentation

## 2018-05-10 DIAGNOSIS — I10 Essential (primary) hypertension: Secondary | ICD-10-CM | POA: Diagnosis not present

## 2018-05-10 DIAGNOSIS — R110 Nausea: Secondary | ICD-10-CM | POA: Insufficient documentation

## 2018-05-10 DIAGNOSIS — R519 Headache, unspecified: Secondary | ICD-10-CM

## 2018-05-10 DIAGNOSIS — R000 Tachycardia, unspecified: Secondary | ICD-10-CM | POA: Diagnosis not present

## 2018-05-10 DIAGNOSIS — R05 Cough: Secondary | ICD-10-CM | POA: Insufficient documentation

## 2018-05-10 LAB — CSF CELL COUNT WITH DIFFERENTIAL
Eosinophils, CSF: 0 %
Eosinophils, CSF: 0 %
Lymphs, CSF: 100 %
Lymphs, CSF: 85 %
Monocyte-Macrophage-Spinal Fluid: 0 %
Monocyte-Macrophage-Spinal Fluid: 15 %
Other Cells, CSF: 0
Other Cells, CSF: 0
RBC Count, CSF: 4 /mm3 — ABNORMAL HIGH (ref 0–3)
RBC Count, CSF: 8 /mm3 — ABNORMAL HIGH (ref 0–3)
Segmented Neutrophils-CSF: 0 %
Segmented Neutrophils-CSF: 0 %
Tube #: 1
Tube #: 4
WBC, CSF: 0 /mm3 (ref 0–5)
WBC, CSF: 0 /mm3 (ref 0–5)

## 2018-05-10 LAB — CBC WITH DIFFERENTIAL/PLATELET
Basophils Absolute: 0.1 10*3/uL (ref 0–0.1)
Basophils Relative: 1 %
Eosinophils Absolute: 0.1 10*3/uL (ref 0–0.7)
Eosinophils Relative: 2 %
HCT: 46.1 % (ref 35.0–47.0)
Hemoglobin: 15.7 g/dL (ref 12.0–16.0)
Lymphocytes Relative: 28 %
Lymphs Abs: 1.8 10*3/uL (ref 1.0–3.6)
MCH: 29 pg (ref 26.0–34.0)
MCHC: 34 g/dL (ref 32.0–36.0)
MCV: 85.3 fL (ref 80.0–100.0)
Monocytes Absolute: 0.4 10*3/uL (ref 0.2–0.9)
Monocytes Relative: 6 %
Neutro Abs: 4.1 10*3/uL (ref 1.4–6.5)
Neutrophils Relative %: 63 %
Platelets: 321 10*3/uL (ref 150–440)
RBC: 5.4 MIL/uL — ABNORMAL HIGH (ref 3.80–5.20)
RDW: 13.7 % (ref 11.5–14.5)
WBC: 6.5 10*3/uL (ref 3.6–11.0)

## 2018-05-10 LAB — COMPREHENSIVE METABOLIC PANEL
ALT: 19 U/L (ref 0–44)
AST: 24 U/L (ref 15–41)
Albumin: 4.4 g/dL (ref 3.5–5.0)
Alkaline Phosphatase: 121 U/L (ref 38–126)
Anion gap: 14 (ref 5–15)
BUN: 11 mg/dL (ref 6–20)
CO2: 25 mmol/L (ref 22–32)
Calcium: 9.8 mg/dL (ref 8.9–10.3)
Chloride: 98 mmol/L (ref 98–111)
Creatinine, Ser: 0.65 mg/dL (ref 0.44–1.00)
GFR calc Af Amer: >60 mL/min (ref >60)
GFR calc non Af Amer: >60 mL/min (ref >60)
Glucose, Bld: 307 mg/dL — ABNORMAL HIGH (ref 70–99)
Potassium: 4.5 mmol/L (ref 3.5–5.1)
Sodium: 137 mmol/L (ref 135–145)
Total Bilirubin: 0.6 mg/dL (ref 0.3–1.2)
Total Protein: 8.2 g/dL — ABNORMAL HIGH (ref 6.5–8.1)

## 2018-05-10 LAB — URINALYSIS, COMPLETE (UACMP) WITH MICROSCOPIC
Bacteria, UA: NONE SEEN
Bilirubin Urine: NEGATIVE
Glucose, UA: >=500 mg/dL — AB
Ketones, ur: 5 mg/dL — AB
Leukocytes, UA: NEGATIVE
Nitrite: NEGATIVE
Protein, ur: NEGATIVE mg/dL
Specific Gravity, Urine: 1.036 — ABNORMAL HIGH (ref 1.005–1.030)
pH: 6 (ref 5.0–8.0)

## 2018-05-10 LAB — LACTIC ACID, PLASMA
Lactic Acid, Venous: 3.6 mmol/L (ref 0.5–1.9)
Lactic Acid, Venous: 2.3 mmol/L (ref 0.5–1.9)

## 2018-05-10 LAB — GRAM STAIN

## 2018-05-10 LAB — CK: Total CK: 50 U/L (ref 38–234)

## 2018-05-10 LAB — RAPID HIV SCREEN (HIV 1/2 AB+AG)
HIV 1/2 Antibodies: NONREACTIVE
HIV-1 P24 Antigen - HIV24: NONREACTIVE

## 2018-05-10 LAB — GROUP A STREP BY PCR: Group A Strep by PCR: NOT DETECTED

## 2018-05-10 LAB — PROTEIN AND GLUCOSE, CSF
Glucose, CSF: 138 mg/dL — ABNORMAL HIGH (ref 40–70)
Total  Protein, CSF: 19 mg/dL (ref 15–45)

## 2018-05-10 MED ORDER — IOHEXOL 350 MG/ML SOLN
75.0000 mL | Freq: Once | INTRAVENOUS | Status: AC | PRN
  Administered 2018-05-10: 75 mL via INTRAVENOUS

## 2018-05-10 MED ORDER — METOCLOPRAMIDE HCL 5 MG/ML IJ SOLN
10.0000 mg | Freq: Once | INTRAMUSCULAR | Status: AC
  Administered 2018-05-10: 10 mg via INTRAVENOUS
  Filled 2018-05-10: qty 2

## 2018-05-10 MED ORDER — SODIUM CHLORIDE 0.9 % IV BOLUS
1000.0000 mL | Freq: Once | INTRAVENOUS | Status: AC
  Administered 2018-05-10: 1000 mL via INTRAVENOUS

## 2018-05-10 MED ORDER — LIDOCAINE HCL (PF) 1 % IJ SOLN
INTRAMUSCULAR | Status: AC
  Filled 2018-05-10: qty 5

## 2018-05-10 MED ORDER — FENTANYL CITRATE (PF) 100 MCG/2ML IJ SOLN
50.0000 ug | Freq: Once | INTRAMUSCULAR | Status: AC
  Administered 2018-05-10: 50 ug via INTRAVENOUS
  Filled 2018-05-10: qty 2

## 2018-05-10 MED ORDER — MORPHINE SULFATE (PF) 4 MG/ML IV SOLN: 4.0000 mg | Freq: Once | INTRAVENOUS | Status: DC

## 2018-05-10 MED ORDER — DOXYCYCLINE HYCLATE 100 MG PO CAPS: 100.0000 mg | ORAL_CAPSULE | Freq: Two times a day (BID) | ORAL | 0 refills | Status: AC

## 2018-05-10 MED ORDER — SODIUM CHLORIDE 0.9 % IV BOLUS: 1000.0000 mL | Freq: Once | INTRAVENOUS | Status: DC

## 2018-05-10 NOTE — Assessment & Plan Note (Signed)
Suspect viral syndrome - but labs done due to length of illness as well as cxr

## 2018-05-10 NOTE — ED Provider Notes (Signed)
[facility] Emergency Department Provider Note  ____________________________________________  Time seen: Approximately 10:35 AM  I have reviewed the triage vital signs and the nursing notes.   HISTORY  Chief Complaint Headache and Neck Pain   HPI Joan [name] is a 37 y.o. female with a history of diabetes and hypertension who presents for evaluation of a headache and neck pain.  Patient reports that she has been sick on and off for the last 2 months.  Initially had pneumonia and then was diagnosed with flu A and B 11 days ago.  She finished Tamiflu.  She reports 4 days of constant sharp pain located in the back of her head radiating down to her neck.  The pain is worse with movement of the neck.  No rashes, no sore throat, no prior history of meningitis.  Patient reports no fevers at home with her T-max of 51F.  No known tick bites.  She also has had a mildly productive cough but no shortness of breath or chest pain. Also has body aches.  She has had nausea but no vomiting or diarrhea or abdominal pain.  No photophobia or phonophobia, no changes in vision.  Past Medical History:  Diagnosis Date  . Auditory hallucinations   . Diabetes mellitus without complication (HCC)    diet controlled  . Diabetes mellitus, type II ([place])   . Essential hypertension   . Essential hypertension 11/24/2017  . MDD (major depressive disorder)   . Miscarriage   . PTSD (post-traumatic stress disorder)     Patient Active Problem List   Diagnosis Date Noted  . Fever and chills 05/08/2018  . Upper respiratory infection 05/08/2018  . Tachycardia 05/04/2018  . Missed period 04/29/2018  . Asthma 04/10/2018  . RLQ abdominal pain 02/13/2018  . BV (bacterial vaginosis) 02/13/2018  . Type 2 diabetes mellitus without complication, without long-term current use of insulin ([place]) 11/24/2017  . Essential hypertension 11/24/2017  . Hyperlipidemia 11/24/2017  . Major depressive  disorder, recurrent, severe without psychotic features ([place])   . PTSD (post-traumatic stress disorder) 04/11/2015    Past Surgical History:  Procedure Laterality Date  . ADENOIDECTOMY    . CHOLECYSTECTOMY  2009  . OVARY SURGERY    . TONSILLECTOMY    . tubes in ear      Prior to Admission medications   Medication Sig Start Date End Date Taking? Authorizing Provider  ARIPiprazole (ABILIFY) 10 MG tablet Take 0.5 tablets (5 mg total) by mouth daily. 04/27/18  Yes Ursula Alert, MD  atorvastatin (LIPITOR) 40 MG tablet Take 1 tablet by mouth at bedtime for cholesterol. 11/26/17  Yes Pleas Koch, NP  citalopram (CELEXA) 20 MG tablet Take 1 tablet (20 mg total) by mouth daily. 04/10/18 07/09/18 Yes Pleas Koch, NP  doxepin (SINEQUAN) 10 MG capsule Take 1-2 capsules (10-20 mg total) by mouth at bedtime. For sleep 04/27/18  Yes Eappen, Ria Clock, MD  empagliflozin (JARDIANCE) 25 MG TABS tablet Take 25 mg by mouth daily. 12/18/17  Yes Pleas Koch, NP  Fluticasone-Salmeterol (ADVAIR DISKUS) 250-50 MCG/DOSE AEPB Inhale 1 puff into the lungs 2 (two) times daily. Rinse mouth after use. 04/16/18  Yes Laverle Hobby, MD  Insulin Glargine (LANTUS) 100 UNIT/ML Solostar Pen Inject 24 Units into the skin every evening. 04/10/18  Yes Pleas Koch, NP  ipratropium-albuterol (DUONEB) 0.5-2.5 (3) MG/3ML SOLN Take 3 mLs by nebulization every 6 (six) hours as needed. 04/16/18  Yes Laverle Hobby, MD  metFORMIN (  GLUCOPHAGE) 1000 MG tablet Take 1 tablet (1,000 mg total) by mouth 2 (two) times daily with a meal. 12/18/17  Yes Pleas Koch, NP  montelukast (SINGULAIR) 10 MG tablet Take 1 tablet (10 mg total) by mouth at bedtime. 04/16/18  Yes Laverle Hobby, MD  VENTOLIN HFA 108 (90 Base) MCG/ACT inhaler INHALE 2 PUFFS BY MOUTH EVERY 6 HOURS AS NEEDED FOR SHORTNESS OF BREATH AND WHEEZING 04/23/18  Yes Pleas Koch, NP  blood glucose meter kit and supplies KIT Dispense based  on patient and insurance preference. Use up three times daily as directed. (FOR ICD-9 250.00, 250.01). 11/24/17   Pleas Koch, NP  doxycycline (VIBRAMYCIN) 100 MG capsule Take 1 capsule (100 mg total) by mouth 2 (two) times daily for 14 days. 05/10/18 05/24/18  Rudene Re, MD  fluticasone [facility]) 50 MCG/ACT nasal spray Place into the nose. 03/10/18   [provider]  hydrOXYzine (ATARAX/VISTARIL) 50 MG tablet  11/28/17   [provider]  Insulin Pen Needle (PEN NEEDLES) 31G X 6 MM MISC Use with insulin as directed. 11/25/17   Pleas Koch, NP  losartan (COZAAR) 100 MG tablet Take 1 tablet by mouth once daily for blood pressure. 12/18/17   Pleas Koch, NP  Spacer/Aero-Holding Chambers (AEROCHAMBER PLUS) inhaler Use as instructed. Generic spacer if possible 03/30/18   Copland, Frederico Hamman, MD    Allergies Ciprofloxacin; Ibuprofen; Penicillins; and Sulfa antibiotics  Family History  Problem Relation Age of Onset  . Asthma Mother   . Diabetes Mother   . Hyperlipidemia Mother   . Hypertension Mother   . Diabetes Father   . Hyperlipidemia Father   . Hypertension Father   . Diabetes Brother   . Depression Brother   . Alcohol abuse Brother   . Depression Maternal Grandmother   . Stroke Paternal Grandmother     Social History Social History   Tobacco Use  . Smoking status: Never Smoker  . Smokeless tobacco: Never Used  Substance Use Topics  . Alcohol use: No  . Drug use: No    Review of Systems  Constitutional: Negative for fever. + body aches Eyes: Negative for visual changes. ENT: Negative for sore throat. Neck: + neck pain  Cardiovascular: Negative for chest pain. Respiratory: Negative for shortness of breath. + cough Gastrointestinal: Negative for abdominal pain, vomiting or diarrhea. + nausea Genitourinary: Negative for dysuria. Musculoskeletal: Negative for back pain. Skin: Negative for rash. Neurological: Negative for  weakness or  numbness. + HA Psych: No SI or HI  ____________________________________________   PHYSICAL EXAM:  VITAL SIGNS: ED Triage Vitals  Enc Vitals Group     BP 05/10/18 1011 127/85     Pulse Rate 05/10/18 1011 (!) 115     Resp 05/10/18 1011 20     Temp 05/10/18 1011 97.6 F (36.4 C)     Temp Source 05/10/18 1011 Oral     SpO2 05/10/18 1011 98 %     Weight 05/10/18 1011 273 lb (123.8 kg)     Height 05/10/18 1011 _0  (1.676 m)     Head Circumference --      Peak Flow --      Pain Score 05/10/18 1018 9     Pain Loc --      Pain Edu? --      Excl. in [place]? --     Constitutional: Alert and oriented. Well appearing and in no apparent distress. HEENT:      Head: Normocephalic and  atraumatic.         Eyes: Conjunctivae are normal. Sclera is non-icteric.       Mouth/Throat: Mucous membranes are moist.       Neck: Supple with no signs of meningismus. Bilateral paraspinal tenderness Cardiovascular: Tachycardic with regular rate and rhythm. No murmurs, gallops, or rubs. 2+ symmetrical distal pulses are present in all extremities. No JVD. Respiratory: Normal respiratory effort. Lungs are clear to auscultation bilaterally. No wheezes, crackles, or rhonchi.  Gastrointestinal: Soft, non tender, and non distended with positive bowel sounds. No rebound or guarding. Genitourinary: No CVA tenderness. Musculoskeletal: Nontender with normal range of motion in all extremities. No edema, cyanosis, or erythema of extremities. Neurologic: Normal speech and language. Face is symmetric. Moving all extremities. No gross focal neurologic deficits are appreciated. Skin: Skin is warm, dry and intact. No rash noted. Psychiatric: Mood and affect are normal. Speech and behavior are normal.  ____________________________________________   LABS (all labs ordered are listed, but only abnormal results are displayed)  Labs Reviewed  CBC WITH DIFFERENTIAL/PLATELET - Abnormal; Notable for the following components:       Result Value   RBC 5.40 (*)    All other components within normal limits  COMPREHENSIVE METABOLIC PANEL - Abnormal; Notable for the following components:   Glucose, Bld 307 (*)    Total Protein 8.2 (*)    All other components within normal limits  LACTIC ACID, PLASMA - Abnormal; Notable for the following components:   Lactic Acid, Venous 3.6 (*)    All other components within normal limits  URINALYSIS, COMPLETE (UACMP) WITH MICROSCOPIC - Abnormal; Notable for the following components:   Color, Urine STRAW (*)    APPearance CLEAR (*)    Specific Gravity, Urine 1.036 (*)    Glucose, UA >=500 (*)    Hgb urine dipstick MODERATE (*)    Ketones, ur 5 (*)    All other components within normal limits  GROUP A STREP BY PCR  CULTURE, BLOOD (ROUTINE X 2)  CULTURE, BLOOD (ROUTINE X 2)  CSF CULTURE  GRAM STAIN  CK  RAPID HIV SCREEN (HIV 1/2 AB+AG)  CSF CELL COUNT WITH DIFFERENTIAL  CSF CELL COUNT WITH DIFFERENTIAL  PROTEIN AND GLUCOSE, CSF   ____________________________________________  EKG  ED ECG REPORT I, Rudene Re, the attending physician, personally viewed and interpreted this ECG.  Sinus tachycardia, rate of 107, normal intervals, normal axis, no ST elevations or depressions, low voltage QRS.  Unchanged from prior. ____________________________________________  RADIOLOGY  I have personally reviewed the images performed during this visit and I agree with the Radiologist's read.   Interpretation by Radiologist:  Dg Chest 2 View  Result Date: 05/10/2018 CLINICAL DATA:  Fever, weakness, shortness of breath EXAM: CHEST - 2 VIEW COMPARISON:  05/08/2018 FINDINGS: Heart and mediastinal contours are within normal limits. No focal opacities or effusions. No acute bony abnormality. IMPRESSION: No active cardiopulmonary disease. Electronically Signed   By: Rolm Baptise M.D.   On: 05/10/2018 10:54   Ct Head Wo Contrast  Result Date: 05/10/2018 CLINICAL DATA:  Worsening  posterior headaches for 5 days, nausea without vomiting, dizziness, history type II diabetes mellitus, essential hypertension EXAM: CT HEAD WITHOUT CONTRAST TECHNIQUE: Contiguous axial images were obtained from the base of the skull through the vertex without intravenous contrast. Sagittal and coronal MPR images reconstructed from axial data set. COMPARISON:  None FINDINGS: Brain: Normal ventricular morphology. No midline shift or mass effect. Normal appearance of brain parenchyma. No intracranial hemorrhage,  mass lesion, evidence of acute infarction, or extra-axial fluid collection. Vascular: No hyperdense vessels Skull: Normal appearance Sinuses/Orbits: Clear Other: N/A IMPRESSION: Normal exam. Electronically Signed   By: Lavonia Dana M.D.   On: 05/10/2018 12:08   Ct Angio Neck W And/or Wo Contrast  Result Date: 05/10/2018 CLINICAL DATA:  37 year old female with recent viral syndrome. Worsening headache and neck pain. Low-grade fever. EXAM: CT ANGIOGRAPHY NECK TECHNIQUE: Multidetector CT imaging of the neck was performed using the standard protocol during bolus administration of intravenous contrast. Multiplanar CT image reconstructions and MIPs were obtained to evaluate the vascular anatomy. Carotid stenosis measurements (when applicable) are obtained utilizing NASCET criteria, using the distal internal carotid diameter as the denominator. CONTRAST:  55m OMNIPAQUE IOHEXOL 350 MG/ML SOLN COMPARISON:  Head CT without contrast 1150 hours today. FINDINGS: Skeleton: Absent posterior dentition. Straightening of cervical lordosis. The cervical spine appears intact. No significant cervical spine degeneration identified. No acute osseous abnormality identified. Visualized paranasal sinuses and mastoids are stable and well pneumatized. Upper chest: Negative upper lungs. No superior mediastinal lymphadenopathy. Other neck: Negative visible larynx, pharynx, parapharyngeal spaces, retropharyngeal space, sublingual space,  submandibular spaces and parotid spaces. No lymphadenopathy. Aortic arch: Visible central pulmonary artery is patent. Three vessel arch configuration. No arch atherosclerosis or great vessel origin stenosis. Right carotid system: Normal. Visible right ICA siphon appears patent and normal. Left carotid system: Normal. Visible left ICA siphon appears patent and normal. Vertebral arteries: Normal proximal right subclavian artery and right vertebral artery origin. The right vertebral artery appears non dominant but is patent to the vertebrobasilar junction without stenosis. Patent right PICA origin. Patent basilar artery. Normal proximal left subclavian artery and left vertebral artery origin. The left vertebral artery is dominant and patent to the vertebrobasilar junction without stenosis. Patent left PICA origin. Review of the MIP images confirms the above findings IMPRESSION: Normal Neck CTA. Electronically Signed   By: HGenevie AnnM.D.   On: 05/10/2018 13:22      ____________________________________________   PROCEDURES  Procedure(s) performed:yes .Lumbar Puncture Date/Time: 05/10/2018 3:03 PM Performed by: VRudene Re MD Authorized by: VRudene Re MD   Consent:    Consent obtained:  Verbal and written   Consent given by:  Patient   Risks discussed:  Headache, bleeding, infection, pain and nerve damage Pre-procedure details:    Procedure purpose:  Diagnostic   Preparation: Patient was prepped and draped in usual sterile fashion   Anesthesia (see MAR for exact dosages):    Anesthesia method:  Local infiltration   Local anesthetic:  Lidocaine 1% w/o epi Procedure details:    Lumbar space:  L3-L4 interspace   Patient position:  R lateral decubitus   Needle gauge:  22   Needle type:  Spinal needle - Quincke tip   Number of attempts:  1   Fluid appearance:  Clear   Tubes of fluid:  4 Post-procedure:    Puncture site:  Adhesive bandage applied and direct pressure applied    Patient tolerance of procedure:  Tolerated well, no immediate complications   Critical Care performed:  None ____________________________________________   INITIAL IMPRESSION / ASSESSMENT AND PLAN / ED COURSE  37y.o. female with a history of diabetes and hypertension who presents for evaluation of 4 days of occipital HA radiating to her neck, productive cough, body aches, and nausea. Patient recently treated for PNA and Influenza A and B.  Patient is extremely well-appearing, afebrile with mild tachycardia, no photophobia, neurologically intact, she has mild discomfort  to palpation on paraspinal muscles of the neck but no meningeal signs, no rash, lungs are clear, oropharynx is clear, abdomen is soft.  Differential diagnoses including viral illness versus recurrent pneumonia versus tickborne illness.  Less likely bacterial meningitis with an extremely well-appearing and afebrile patient with symptoms lasting 4 days although viral meningitsi is also possible.  Also less likely bacteremia with no fever.  Will check CBC, CMP, lactic, urinalysis, chest x-ray.  Do not see the need to repeat fluid at this time since he was 11 days ago.  We will give fentanyl, Reglan and fluids for symptom relief. Clinical Course as of May 10 1502  Sun May 10, 2018  1457 Labs other than a lactic were all within normal limits.  Lactic of 3.6.  Had a long discussion with patient about risks of benefits of spinal tap considering that she has had 4 days of symptoms and no fever, patient opted to undergo LP which was done per procedure note above. CSF studies pending. Elevated lactic could also be due to dehydration and metformin. Repeat lactic pending. If CSF negative plan to dc on doxycycline for possible tick borne illness and f/u with PCP.   [CV]    Clinical Course User Index [CV] Alfred Levins [place], MD     As part of my medical decision making, I reviewed the following data within the [place] notes reviewed and incorporated, Labs reviewed , Old chart reviewed, Radiograph reviewed , Notes from prior ED visits and [place] Controlled Substance Database    Pertinent labs & imaging results that were available during my care of the patient were reviewed by me and considered in my medical decision making (see chart for details).    ____________________________________________   FINAL CLINICAL IMPRESSION(S) / ED DIAGNOSES  Final diagnoses:  Neck pain  Occipital headache      NEW MEDICATIONS STARTED DURING THIS VISIT:  ED Discharge Orders         Ordered    doxycycline (VIBRAMYCIN) 100 MG capsule  2 times daily     05/10/18 1459           Note:  This document was prepared using Dragon voice recognition software and may include unintentional dictation errors.    Alfred Levins, [place], MD 05/10/18 [id number]

## 2018-05-10 NOTE — ED Notes (Signed)
First Nurse Note: Patient ambulatory to Rm 6 with face mask on.  Brooke RN aware of room placement.

## 2018-05-10 NOTE — ED Notes (Signed)
Lab called to verify labels on tubes.

## 2018-05-10 NOTE — Assessment & Plan Note (Signed)
Viral syndrome with recent influenza tx with tamiflu (which she tolerated poorly but finished)  Low grade temp /body aches and headache (some neck pain)  Reassuring exam  Pt states she is sick all the time/ cannot get better  Lab today including tick labs  cxr today  Recommend sympt care and analgesics and fluids If severe HA or neck stiffness-recommend ED (she voiced understanding)  Update if not starting to improve in a week or if worsening

## 2018-05-10 NOTE — ED Triage Notes (Signed)
Pt to ED via POV c/o neck pain and headache. Pt states that she has been having symptoms since Wednesday night but they have progressively gotten worse. Pt is afebrile in triage. Pt states that her temperature at home has been 99.1 but she runs around 96 normally so this is fever for her. Pt is in NAD at this time.

## 2018-05-10 NOTE — ED Notes (Signed)
Procedure finished. Patient is lying flat for 15-20 minutes. Patient tolerated procedure well.

## 2018-05-10 NOTE — Discharge Instructions (Addendum)
You have been seen in the Emergency Department (ED) for a headache. Your evaluation today was overall reassuring. Headaches have many possible causes. Most headaches aren't a sign of a more serious problem, and they will get better on their own.   Follow-up with your doctor in 12-24 hours if you are still having a headache. Otherwise follow up with your doctor in 3-5 days.  For pain take tylenol. Take doxycycline in case your symptoms are from a tick borne illness.  When should you call for help?  Call 911 or return to the ED anytime you think you may need emergency care. For example, call if:  You have signs of a stroke. These may include:  Sudden numbness, paralysis, or weakness in your face, arm, or leg, especially on only one side of your body.  Sudden vision changes.  Sudden trouble speaking.  Sudden confusion or trouble understanding simple statements.  Sudden problems with walking or balance.  A sudden, severe headache that is different from past headaches. You have new or worsening headache Nausea and vomiting associated with your headache Fever, neck stiffness associated with your headache  Call your doctor now or seek immediate medical care if:  You have a new or worse headache.  Your headache gets much worse.  How can you care for yourself at home?  Do not drive if you have taken a prescription pain medicine.  Rest in a quiet, dark room until your headache is gone. Close your eyes and try to relax or go to sleep. Don't watch TV or read.  Put a cold, moist cloth or cold pack on the painful area for 10 to 20 minutes at a time. Put a thin cloth between the cold pack and your skin.  Use a warm, moist towel or a heating pad set on low to relax tight shoulder and neck muscles.  Have someone gently massage your neck and shoulders.  Take pain medicines exactly as directed.  If the doctor gave you a prescription medicine for pain, take it as prescribed.  If you are not taking a  prescription pain medicine, ask your doctor if you can take an over-the-counter medicine. Be careful not to take pain medicine more often than the instructions allow, because you may get worse or more frequent headaches when the medicine wears off.  Do not ignore new symptoms that occur with a headache, such as a fever, weakness or numbness, vision changes, or confusion. These may be signs of a more serious problem.  To prevent headaches  Keep a headache diary so you can figure out what triggers your headaches. Avoiding triggers may help you prevent headaches. Record when each headache began, how long it lasted, and what the pain was like (throbbing, aching, stabbing, or dull). Write down any other symptoms you had with the headache, such as nausea, flashing lights or dark spots, or sensitivity to bright light or loud noise. Note if the headache occurred near your period. List anything that might have triggered the headache, such as certain foods (chocolate, cheese, wine) or odors, smoke, bright light, stress, or lack of sleep.  Find healthy ways to deal with stress. Headaches are most common during or right after stressful times. Take time to relax before and after you do something that has caused a headache in the past.  Try to keep your muscles relaxed by keeping good posture. Check your jaw, face, neck, and shoulder muscles for tension, and try relaxing them. When sitting at a desk,  change positions often, and stretch for 30 seconds each hour.  Get plenty of sleep and exercise.  Eat regularly and well. Long periods without food can trigger a headache.  Treat yourself to a massage. Some people find that regular massages are very helpful in relieving tension.  Limit caffeine by not drinking too much coffee, tea, or soda. But don't quit caffeine suddenly, because that can also give you headaches.  Reduce eyestrain from computers by blinking frequently and looking away from the computer screen every so  often. Make sure you have proper eyewear and that your monitor is set up properly, about an arm's length away.  Seek help if you have depression or anxiety. Your headaches may be linked to these conditions. Treatment can both prevent headaches and help with symptoms of anxiety or depression.

## 2018-05-10 NOTE — ED Notes (Signed)
Pt given lunch tray and is off monitor

## 2018-05-10 NOTE — ED Notes (Signed)
Consent for procedure obtained by Dr. Don Perking. Husband at bedside.

## 2018-05-10 NOTE — ED Notes (Signed)
Report received - lorrie in with pt attempting iv. ekg reprinted for correct info and given to dr.

## 2018-05-11 ENCOUNTER — Encounter: Payer: Self-pay | Admitting: Family Medicine

## 2018-05-12 ENCOUNTER — Encounter: Payer: Self-pay | Admitting: Psychiatry

## 2018-05-12 ENCOUNTER — Ambulatory Visit (INDEPENDENT_AMBULATORY_CARE_PROVIDER_SITE_OTHER): Payer: BLUE CROSS/BLUE SHIELD | Admitting: Psychiatry

## 2018-05-12 ENCOUNTER — Other Ambulatory Visit: Payer: Self-pay

## 2018-05-12 VITALS — BP 137/95 | HR 108 | Temp 97.9°F | Wt 276.6 lb

## 2018-05-12 DIAGNOSIS — F4010 Social phobia, unspecified: Secondary | ICD-10-CM | POA: Diagnosis not present

## 2018-05-12 DIAGNOSIS — F4310 Post-traumatic stress disorder, unspecified: Secondary | ICD-10-CM

## 2018-05-12 DIAGNOSIS — F3161 Bipolar disorder, current episode mixed, mild: Secondary | ICD-10-CM | POA: Diagnosis not present

## 2018-05-12 LAB — B. BURGDORFI ANTIBODIES: B burgdorferi Ab IgG+IgM: <0.9 index

## 2018-05-12 LAB — ROCKY MTN SPOTTED FVR ABS PNL(IGG+IGM)
RMSF IgG: NOT DETECTED
RMSF IgM: NOT DETECTED

## 2018-05-12 MED ORDER — ARIPIPRAZOLE 5 MG PO TABS: 7.5000 mg | ORAL_TABLET | Freq: Every day | ORAL | 1 refills | Status: DC

## 2018-05-12 NOTE — Progress Notes (Signed)
[facility] MD OP Progress Note  05/12/2018 9:28 AM Joan [name]  MRN:  [id number]  Chief Complaint: ' I am here for follow up.' Chief Complaint    Follow-up; Medication Refill     HPI: Joan [name] is a 37 year old Caucasian female, married, lives in [place], has a history of PTSD, depression, anxiety, hypertension, diabetes, presented to the clinic today for a follow-up visit.  Patient reports she was diagnosed with flu recently.  She reports she went through dehydration and had a lot of fatigue.  She reports she currently feels better.  She reports she hence had to quit her new job since she was working third shift.  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  She looks forward to starting that job soon.  She reports this new job has flexible hours and she can work second shift and she is happy about that.  Patient continues to have some mood lability on and off but reports Abilify is helpful.  She denies any side effects to the Abilify.  Patient continues to struggle with sleep.  She reports it may also be because she went through the shift changes and she needs to get used to her new schedule.  She reports she tried the doxepin 10 mg but has not tried the 20 mg yet.  She reports she would like to try the 20 mg before making any changes with her sleep aid.  Patient denies any suicidality or homicidality.  Patient denies any perceptual disturbances.  Patient denies any other concerns today.  She reports she has upcoming appointment with our therapist. Visit Diagnosis:    ICD-10-CM   1. Bipolar 1 disorder, mixed, mild (HCC) F31.61 ARIPiprazole (ABILIFY) 5 MG tablet  2. PTSD (post-traumatic stress disorder) F43.10   3. Social anxiety disorder F40.10     Past Psychiatric History: Reviewed past psychiatric history from my progress note on 04/27/2018.  Past trials of Latuda, Abilify, Minipress, Celexa, trazodone, hydroxyzine.  Past Medical History:  Past Medical History:   Diagnosis Date  . Auditory hallucinations   . Diabetes mellitus without complication (HCC)    diet controlled  . Diabetes mellitus, type II ([place])   . Essential hypertension   . Essential hypertension 11/24/2017  . MDD (major depressive disorder)   . Miscarriage   . PTSD (post-traumatic stress disorder)     Past Surgical History:  Procedure Laterality Date  . ADENOIDECTOMY    . CHOLECYSTECTOMY  2009  . OVARY SURGERY    . TONSILLECTOMY    . tubes in ear      Family Psychiatric History: Reviewed family psychiatric history from my progress note on 04/27/2018.  Family History:  Family History  Problem Relation Age of Onset  . Asthma Mother   . Diabetes Mother   . Hyperlipidemia Mother   . Hypertension Mother   . Diabetes Father   . Hyperlipidemia Father   . Hypertension Father   . Diabetes Brother   . Depression Brother   . Alcohol abuse Brother   . Depression Maternal Grandmother   . Stroke Paternal Grandmother     Social History: Reviewed social history from my progress note on 04/27/2018. Social History   Socioeconomic History  . Marital status: Married    Spouse name: chip  . Number of children: 0  . Years of education: Not on file  . Highest education level: High school graduate  Occupational History  . Not on file  Social Needs  .  Financial resource strain: Not hard at all  . Food insecurity:    Worry: Never true    Inability: Never true  . Transportation needs:    Medical: No    Non-medical: No  Tobacco Use  . Smoking status: Never Smoker  . Smokeless tobacco: Never Used  Substance and Sexual Activity  . Alcohol use: No  . Drug use: No  . Sexual activity: Yes  Lifestyle  . Physical activity:    Days per week: 0 days    Minutes per session: 0 min  . Stress: Very much  Relationships  . Social connections:    Talks on phone: Not on file    Gets together: Not on file    Attends religious service: More than 4 times per year    Active member of  club or organization: No    Attends meetings of clubs or organizations: Never    Relationship status: Married  Other Topics Concern  . Not on file  Social History Narrative   Engaged.    Allergies:  Allergies  Allergen Reactions  . Ciprofloxacin Hives and Rash  . Ibuprofen Swelling    Facial  Other reaction(s): Other, Other (See Comments) Whole face swelling Facial  Facial    . Penicillins Hives and Rash    Has patient had a PCN reaction causing immediate rash, facial/tongue/throat swelling, SOB or lightheadedness with hypotension: No Has patient had a PCN reaction causing severe rash involving mucus membranes or skin necrosis: No Has patient had a PCN reaction that required hospitalization: No Has patient had a PCN reaction occurring within the last 10 years: No If all of the above answers are "NO", then may proceed with Cephalosporin use. THE PATIENT IS ABLE TO TOLERATE CEPHALOSPORINS WITHOUT DIFFIC  . Sulfa Antibiotics Hives and Rash    Metabolic Disorder Labs: Lab Results  Component Value Date   HGBA1C 10.1 (H) 02/27/2018   MPG 243 02/27/2018   MPG 131 04/12/2015   Lab Results  Component Value Date   PROLACTIN 14.5 05/05/2015   Lab Results  Component Value Date   CHOL 287 (H) 02/27/2018   TRIG 581 (H) 02/27/2018   HDL 48 (L) 02/27/2018   CHOLHDL 6.0 (H) 02/27/2018   VLDL 51.4 (H) 11/24/2017   [place]  02/27/2018     Comment:     . LDL cholesterol not calculated. Triglyceride levels greater than 400 mg/dL invalidate calculated LDL results. . Reference range: <100 . Desirable range <100 mg/dL for primary prevention;   <70 mg/dL for patients with CHD or diabetic patients  with > or = 2 CHD risk factors. Marland Kitchen LDL-C is now calculated using the Martin-Hopkins  calculation, which is a validated novel method providing  better accuracy than the Friedewald equation in the  estimation of LDL-C.  Cresenciano Genre et al. Annamaria Helling. 0370;488(89): 2061-2068   (http://education.[employer].com/faq/FAQ164)    LDLCALC 94 05/05/2015   Lab Results  Component Value Date   TSH 1.82 05/08/2018   TSH 1.717 04/12/2015    Therapeutic Level Labs: No results found for: LITHIUM No results found for: VALPROATE No components found for:  CBMZ  Current Medications: Current Outpatient Medications  Medication Sig Dispense Refill  . atorvastatin (LIPITOR) 40 MG tablet Take 1 tablet by mouth at bedtime for cholesterol. 90 tablet 1  . blood glucose meter kit and supplies KIT Dispense based on patient and insurance preference. Use up three times daily as directed. (FOR ICD-9 250.00, 250.01). 1 each 0  . citalopram (  CELEXA) 20 MG tablet Take 1 tablet (20 mg total) by mouth daily. 90 tablet 0  . doxepin (SINEQUAN) 10 MG capsule Take 1-2 capsules (10-20 mg total) by mouth at bedtime. For sleep 60 capsule 0  . doxycycline (VIBRAMYCIN) 100 MG capsule Take 1 capsule (100 mg total) by mouth 2 (two) times daily for 14 days. 28 capsule 0  . empagliflozin (JARDIANCE) 25 MG TABS tablet Take 25 mg by mouth daily. 90 mg 3  . fluticasone (FLONASE) 50 MCG/ACT nasal spray Place into the nose.    Marland Kitchen Fluticasone-Salmeterol (ADVAIR DISKUS) 250-50 MCG/DOSE AEPB Inhale 1 puff into the lungs 2 (two) times daily. Rinse mouth after use. 1 each 0  . hydrOXYzine (ATARAX/VISTARIL) 50 MG tablet   0  . Insulin Glargine (LANTUS) 100 UNIT/ML Solostar Pen Inject 24 Units into the skin every evening. 15 mL 5  . Insulin Pen Needle (PEN NEEDLES) 31G X 6 MM MISC Use with insulin as directed. 100 each 2  . ipratropium-albuterol (DUONEB) 0.5-2.5 (3) MG/3ML SOLN Take 3 mLs by nebulization every 6 (six) hours as needed. 360 mL 5  . losartan (COZAAR) 100 MG tablet Take 1 tablet by mouth once daily for blood pressure. 90 tablet 3  . metFORMIN (GLUCOPHAGE) 1000 MG tablet Take 1 tablet (1,000 mg total) by mouth 2 (two) times daily with a meal. 180 tablet 3  . montelukast (SINGULAIR) 10 MG tablet Take  1 tablet (10 mg total) by mouth at bedtime. 30 tablet 3  . Spacer/Aero-Holding Chambers (AEROCHAMBER PLUS) inhaler Use as instructed. Generic spacer if possible 1 each 2  . VENTOLIN HFA 108 (90 Base) MCG/ACT inhaler INHALE 2 PUFFS BY MOUTH EVERY 6 HOURS AS NEEDED FOR SHORTNESS OF BREATH AND WHEEZING 18 g 0  . ARIPiprazole (ABILIFY) 5 MG tablet Take 1.5 tablets (7.5 mg total) by mouth daily. 45 tablet 1   No current facility-administered medications for this visit.      Musculoskeletal: Strength & Muscle Tone: within normal limits Gait & Station: normal Patient leans: N/A  Psychiatric Specialty Exam: Review of Systems  Psychiatric/Behavioral: The patient is nervous/anxious and has insomnia.   All other systems reviewed and are negative.   Blood pressure (!) 137/95, pulse (!) 108, temperature 97.9 F (36.6 C), temperature source Oral, weight 276 lb 9.6 oz (125.5 kg), last menstrual period 05/06/2018.Body mass index is 44.64 kg/m.  General Appearance: Casual  Eye Contact:  Fair  Speech:  Normal Rate  Volume:  Normal  Mood:  Anxious  Affect:  Appropriate  Thought Process:  Goal Directed and Descriptions of Associations: Intact  Orientation:  Full (Time, Place, and Person)  Thought Content: Logical   Suicidal Thoughts:  No  Homicidal Thoughts:  No  Memory:  Immediate;   Fair Recent;   Fair Remote;   Fair  Judgement:  Fair  Insight:  Fair  Psychomotor Activity:  Normal  Concentration:  Concentration: Fair and Attention Span: Fair  Recall:  [employer] of Knowledge: Fair  Language: Fair  Akathisia:  No  Handed:  Right  AIMS (if indicated): denies tremors, rigidity,stiffness  Assets:  Communication Skills Desire for Improvement Housing Social Support  ADL's:  Intact  Cognition: WNL  Sleep:  Fair   Screenings: AIMS     Admission (Discharged) from 05/03/2015 in [facility] Admission (Discharged) from 04/11/2015 in [facility]  AIMS Total Score  0  0    AUDIT  Admission (Discharged) from 05/03/2015 in [facility] Admission (Discharged) from 04/11/2015 in [facility]  Alcohol Use Disorder Identification Test Final Score (AUDIT)  0  0       Assessment and Plan: Owen is a 37 yr old Caucasian female, employed, married, lives in [place], has a history of mood lability, PTSD, insomnia, diabetes, hypertension, presented to the clinic today for a follow-up visit.  Patient is biologically predisposed given her history of trauma as well as family history of mental health problems.  Patient is currently recovering from flu.  She is tolerating the medications well we will continue to make medication changes as noted below.  Plan Bipolar disorder type I mixed Increase Abilify to 7.5 mg p.o. daily Celexa 20 mg p.o. daily  PTSD Celexa 20 mg p.o. daily  For insomnia Sleep study pending Doxepin 10-20 mg p.o. nightly Hydroxyzine 50 mg at bedtime as needed Continue sleep hygiene techniques.  Have reviewed TSH which was recently done on 05/08/2018 - wnl.  She will reach out to our therapist and start psychotherapy visits.  Follow-up in clinic in 4 weeks or sooner if needed.  More than 50 % of the time was spent for psychoeducation and supportive psychotherapy and care coordination.  This note was generated in part or whole with voice recognition software. Voice recognition is usually quite accurate but there are transcription errors that can and very often do occur. I apologize for any typographical errors that were not detected and corrected.         Ursula Alert, MD 05/12/2018, 9:28 AM

## 2018-05-12 NOTE — Patient Instructions (Signed)
Please get your Vitamin B 12 level

## 2018-05-14 LAB — CSF CULTURE W GRAM STAIN
Culture: NO GROWTH
Gram Stain: NONE SEEN

## 2018-05-14 LAB — CSF CULTURE

## 2018-05-15 ENCOUNTER — Ambulatory Visit: Payer: BLUE CROSS/BLUE SHIELD | Admitting: Primary Care

## 2018-05-15 DIAGNOSIS — R509 Fever, unspecified: Secondary | ICD-10-CM | POA: Diagnosis not present

## 2018-05-15 DIAGNOSIS — J0141 Acute recurrent pansinusitis: Secondary | ICD-10-CM | POA: Diagnosis not present

## 2018-05-18 ENCOUNTER — Telehealth: Payer: Self-pay

## 2018-05-18 ENCOUNTER — Ambulatory Visit: Payer: BLUE CROSS/BLUE SHIELD | Admitting: Family Medicine

## 2018-05-18 ENCOUNTER — Encounter: Payer: Self-pay | Admitting: Family Medicine

## 2018-05-18 VITALS — BP 118/80 | HR 113 | Temp 98.9°F | Ht 66.0 in | Wt 276.5 lb

## 2018-05-18 DIAGNOSIS — Z9622 Myringotomy tube(s) status: Secondary | ICD-10-CM

## 2018-05-18 DIAGNOSIS — J069 Acute upper respiratory infection, unspecified: Secondary | ICD-10-CM | POA: Diagnosis not present

## 2018-05-18 DIAGNOSIS — J329 Chronic sinusitis, unspecified: Secondary | ICD-10-CM

## 2018-05-18 DIAGNOSIS — R000 Tachycardia, unspecified: Secondary | ICD-10-CM

## 2018-05-18 MED ORDER — AZITHROMYCIN 250 MG PO TABS: ORAL_TABLET | ORAL | 0 refills | Status: DC

## 2018-05-18 NOTE — Telephone Encounter (Signed)
prior authorization was done online at [email or web address]

## 2018-05-18 NOTE — Progress Notes (Signed)
BP 118/80 (BP Location: Left Arm, Patient Position: Sitting, Cuff Size: Large)   Pulse (!) 113   Temp 98.9 F (37.2 C) (Oral)   Ht _0  (1.676 m)   Wt 276 lb 8 oz (125.4 kg)   LMP 05/06/2018   SpO2 98%   BMI 44.63 kg/m    CC: fever Subjective:    Patient ID: Joan [name], female    DOB: 1981-01-28, 37 y.o.   MRN: [id number]  HPI: Joan [name] is a 37 y.o. female presenting on 05/18/2018 for Fever (C/o fever, max 100. Started 05/15/18. Also had diarrhea yesterday. Denies andy nausea/vomiting. Seen at [facility] on 05/15/18 and dx with sinusitis. Pt accompained by her husband. )   Ongoing respiratory illness for the last 3+ weeks. Initial influenza (A/B) treated with tamiflu, then concern for sinusitis treated with 10d doxycycline. Seen in f/u by Dr Glori Bickers, then went to ER with headache/neck pain s/p lumbar puncture that was reassuring. Records reviewed. CXR negative, CT head and CTA neck reassuring. Tested negative for lyme disease and RMSF and HIV and mononucleosis. Lactic acid was high. [facility] - started on another doxycycline course for presumed persistent sinusitis (3rd course in 2 wks).   Ongoing body aches, dry cough, diarrhea (yesterday). Tmax to 100 last 2 days. This is despite taking tylenol 1069m QID. Bilateral earache. Mild head congestion No chest congestion, tooth pain, ST, PNdrainage. No dyspnea or wheezing.   She did have pneumonia several months ago.  Chronic tachycardia.  Significant antibiotic allergies (PCN, sulfa, cipro). States plain PCN caused hives but she tolerated amoxicillin well in the past.  She has taken zpack well in the past as well as clindamycin.  Relevant past medical, surgical, family and social history reviewed and updated as indicated. Interim medical history since our last visit reviewed. Allergies and medications reviewed and updated. Outpatient Medications Prior to Visit  Medication Sig Dispense Refill   . ARIPiprazole (ABILIFY) 5 MG tablet Take 1.5 tablets (7.5 mg total) by mouth daily. 45 tablet 1  . atorvastatin (LIPITOR) 40 MG tablet Take 1 tablet by mouth at bedtime for cholesterol. 90 tablet 1  . blood glucose meter kit and supplies KIT Dispense based on patient and insurance preference. Use up three times daily as directed. (FOR ICD-9 250.00, 250.01). 1 each 0  . citalopram (CELEXA) 20 MG tablet Take 1 tablet (20 mg total) by mouth daily. 90 tablet 0  . doxepin (SINEQUAN) 10 MG capsule Take 1-2 capsules (10-20 mg total) by mouth at bedtime. For sleep 60 capsule 0  . doxycycline (VIBRAMYCIN) 100 MG capsule Take 1 capsule (100 mg total) by mouth 2 (two) times daily for 14 days. 28 capsule 0  . empagliflozin (JARDIANCE) 25 MG TABS tablet Take 25 mg by mouth daily. 90 mg 3  . fluticasone (FLONASE) 50 MCG/ACT nasal spray Place into the nose.    .Marland KitchenFluticasone-Salmeterol (ADVAIR DISKUS) 250-50 MCG/DOSE AEPB Inhale 1 puff into the lungs 2 (two) times daily. Rinse mouth after use. 1 each 0  . hydrOXYzine (ATARAX/VISTARIL) 50 MG tablet   0  . Insulin Glargine (LANTUS) 100 UNIT/ML Solostar Pen Inject 24 Units into the skin every evening. 15 mL 5  . Insulin Pen Needle (PEN NEEDLES) 31G X 6 MM MISC Use with insulin as directed. 100 each 2  . ipratropium-albuterol (DUONEB) 0.5-2.5 (3) MG/3ML SOLN Take 3 mLs by nebulization every 6 (six) hours as needed. 360 mL 5  .  losartan (COZAAR) 100 MG tablet Take 1 tablet by mouth once daily for blood pressure. 90 tablet 3  . metFORMIN (GLUCOPHAGE) 1000 MG tablet Take 1 tablet (1,000 mg total) by mouth 2 (two) times daily with a meal. 180 tablet 3  . montelukast (SINGULAIR) 10 MG tablet Take 1 tablet (10 mg total) by mouth at bedtime. 30 tablet 3  . Spacer/Aero-Holding Chambers (AEROCHAMBER PLUS) inhaler Use as instructed. Generic spacer if possible 1 each 2  . VENTOLIN HFA 108 (90 Base) MCG/ACT inhaler INHALE 2 PUFFS BY MOUTH EVERY 6 HOURS AS NEEDED FOR SHORTNESS  OF BREATH AND WHEEZING 18 g 0   No facility-administered medications prior to visit.      Per HPI unless specifically indicated in ROS section below Review of Systems     Objective:    BP 118/80 (BP Location: Left Arm, Patient Position: Sitting, Cuff Size: Large)   Pulse (!) 113   Temp 98.9 F (37.2 C) (Oral)   Ht _0  (1.676 m)   Wt 276 lb 8 oz (125.4 kg)   LMP 05/06/2018   SpO2 98%   BMI 44.63 kg/m   Wt Readings from Last 3 Encounters:  05/18/18 276 lb 8 oz (125.4 kg)  05/10/18 273 lb (123.8 kg)  05/08/18 276 lb (125.2 kg)    Physical Exam  Constitutional: She appears well-developed and well-nourished. No distress.  HENT:  Head: Normocephalic and atraumatic.  Right Ear: Hearing, tympanic membrane, external ear and ear canal normal.  Left Ear: Hearing, tympanic membrane, external ear and ear canal normal.  Nose: Mucosal edema (nasal mucosal erythem/congestion) present. No rhinorrhea. Right sinus exhibits no maxillary sinus tenderness and no frontal sinus tenderness. Left sinus exhibits no maxillary sinus tenderness and no frontal sinus tenderness.  Mouth/Throat: Uvula is midline, oropharynx is clear and moist and mucous membranes are normal. No oropharyngeal exudate, posterior oropharyngeal edema, posterior oropharyngeal erythema or tonsillar abscesses.  Sensitive ear canals to inspection Removed tympanostomy tube from entrance of R canal L canal with firm balls of cerumen at canal entrance - unable to remove due to poor tolerance  Eyes: Pupils are equal, round, and reactive to light. Conjunctivae and EOM are normal. No scleral icterus.  Neck: Normal range of motion. Neck supple.  Cardiovascular: Normal rate, regular rhythm, normal heart sounds and intact distal pulses.  No murmur heard. Pulmonary/Chest: Effort normal and breath sounds normal. No respiratory distress. She has no wheezes. She has no rales.  Abdominal: Soft. Bowel sounds are normal. She exhibits no  distension and no mass. There is tenderness (moderate) in the left upper quadrant and left lower quadrant. There is no rebound, no guarding, no CVA tenderness and negative Murphy's sign. No hernia.  Musculoskeletal: She exhibits no edema.  Lymphadenopathy:    She has no cervical adenopathy.  Skin: Skin is warm and dry. No rash noted.  Psychiatric: She has a normal mood and affect.  Nursing note and vitals reviewed.      Assessment & Plan:   Problem List Items Addressed This Visit    Upper respiratory infection    Ongoing body aches, cough and low grade fever for last 3 weeks. See above.      Relevant Medications   azithromycin (ZITHROMAX) 250 MG tablet   Other Relevant Orders   Ambulatory referral to ENT   Tachycardia    Chronic per patient.       Recurrent sinusitis - Primary    Possibly recurrent sinusitis that has not responded  to doxy. Will prescribe azithromycin. She has f/u with PCP later this week. Will refer to ENT for further eval. Pt agrees with plan.       Relevant Medications   azithromycin (ZITHROMAX) 250 MG tablet   Other Relevant Orders   Ambulatory referral to ENT   History of placement of ear tubes    TMs stable today. Pt states she has been told may need repeat ear tubes - requests referral to ENT for second opinion.       Relevant Orders   Ambulatory referral to ENT       Meds ordered this encounter  Medications  . azithromycin (ZITHROMAX) 250 MG tablet    Sig: Take two tablets on day one followed by one tablet on days 2-5    Dispense:  6 each    Refill:  0   Orders Placed This Encounter  Procedures  . Ambulatory referral to ENT    Referral Priority:   Routine    Referral Type:   Consultation    Referral Reason:   Specialty Services Required    Requested Specialty:   Otolaryngology    Number of Visits Requested:   1    Follow up plan: Return if symptoms worsen or fail to improve.  Ria Bush, MD

## 2018-05-18 NOTE — Telephone Encounter (Signed)
received a fax that the prior authorization for aripiprazole was approved from 05-15-18 to 05-13-21

## 2018-05-18 NOTE — Telephone Encounter (Signed)
received a faxed that a prior authorization is neeeded for aripiprazole 5mg .

## 2018-05-18 NOTE — Patient Instructions (Signed)
Stop doxycycline. Take zpack in its place.  We will refer you to ENT.  Good to see you today.

## 2018-05-18 NOTE — Telephone Encounter (Signed)
pharmacy was faxed and confirmed the approveal notice for the medication.

## 2018-05-19 ENCOUNTER — Ambulatory Visit: Payer: BLUE CROSS/BLUE SHIELD | Admitting: Licensed Clinical Social Worker

## 2018-05-19 ENCOUNTER — Ambulatory Visit: Payer: BLUE CROSS/BLUE SHIELD | Admitting: Primary Care

## 2018-05-19 LAB — CULTURE, BLOOD (ROUTINE X 2): Culture: NO GROWTH

## 2018-05-20 DIAGNOSIS — J329 Chronic sinusitis, unspecified: Secondary | ICD-10-CM | POA: Insufficient documentation

## 2018-05-20 DIAGNOSIS — Z9622 Myringotomy tube(s) status: Secondary | ICD-10-CM | POA: Insufficient documentation

## 2018-05-20 HISTORY — DX: Myringotomy tube(s) status: Z96.22

## 2018-05-20 NOTE — Assessment & Plan Note (Signed)
TMs stable today. Pt states she has been told may need repeat ear tubes - requests referral to ENT for second opinion.

## 2018-05-20 NOTE — Assessment & Plan Note (Signed)
Possibly recurrent sinusitis that has not responded to doxy. Will prescribe azithromycin. She has f/u with PCP later this week. Will refer to ENT for further eval. Pt agrees with plan.

## 2018-05-20 NOTE — Assessment & Plan Note (Signed)
Chronic per patient.  

## 2018-05-20 NOTE — Assessment & Plan Note (Signed)
Ongoing body aches, cough and low grade fever for last 3 weeks. See above.

## 2018-05-21 ENCOUNTER — Ambulatory Visit: Payer: BLUE CROSS/BLUE SHIELD | Admitting: Primary Care

## 2018-05-22 ENCOUNTER — Other Ambulatory Visit: Payer: Self-pay | Admitting: Internal Medicine

## 2018-05-22 ENCOUNTER — Other Ambulatory Visit: Payer: Self-pay | Admitting: Psychiatry

## 2018-05-22 DIAGNOSIS — F4310 Post-traumatic stress disorder, unspecified: Secondary | ICD-10-CM

## 2018-05-22 DIAGNOSIS — F3161 Bipolar disorder, current episode mixed, mild: Secondary | ICD-10-CM

## 2018-05-22 MED ORDER — DOXEPIN HCL 10 MG PO CAPS: 10.0000 mg | ORAL_CAPSULE | Freq: Every day | ORAL | 0 refills | Status: DC

## 2018-05-22 NOTE — Telephone Encounter (Signed)
Sent script for Doxepin to pharmacy

## 2018-05-24 DIAGNOSIS — J09X2 Influenza due to identified novel influenza A virus with other respiratory manifestations: Secondary | ICD-10-CM | POA: Diagnosis not present

## 2018-05-24 DIAGNOSIS — R319 Hematuria, unspecified: Secondary | ICD-10-CM | POA: Diagnosis not present

## 2018-05-24 DIAGNOSIS — Z131 Encounter for screening for diabetes mellitus: Secondary | ICD-10-CM | POA: Diagnosis not present

## 2018-05-24 DIAGNOSIS — Z3200 Encounter for pregnancy test, result unknown: Secondary | ICD-10-CM | POA: Diagnosis not present

## 2018-05-24 DIAGNOSIS — N390 Urinary tract infection, site not specified: Secondary | ICD-10-CM | POA: Diagnosis not present

## 2018-05-28 ENCOUNTER — Ambulatory Visit: Payer: BLUE CROSS/BLUE SHIELD | Admitting: Primary Care

## 2018-05-28 VITALS — BP 138/78 | HR 120 | Temp 98.3°F | Ht 66.0 in | Wt 273.2 lb

## 2018-05-28 DIAGNOSIS — E119 Type 2 diabetes mellitus without complications: Secondary | ICD-10-CM | POA: Diagnosis not present

## 2018-05-28 DIAGNOSIS — J329 Chronic sinusitis, unspecified: Secondary | ICD-10-CM | POA: Diagnosis not present

## 2018-05-28 DIAGNOSIS — E782 Mixed hyperlipidemia: Secondary | ICD-10-CM

## 2018-05-28 DIAGNOSIS — J301 Allergic rhinitis due to pollen: Secondary | ICD-10-CM | POA: Diagnosis not present

## 2018-05-28 DIAGNOSIS — H903 Sensorineural hearing loss, bilateral: Secondary | ICD-10-CM | POA: Diagnosis not present

## 2018-05-28 DIAGNOSIS — R000 Tachycardia, unspecified: Secondary | ICD-10-CM | POA: Diagnosis not present

## 2018-05-28 DIAGNOSIS — R509 Fever, unspecified: Secondary | ICD-10-CM | POA: Diagnosis not present

## 2018-05-28 DIAGNOSIS — I10 Essential (primary) hypertension: Secondary | ICD-10-CM

## 2018-05-28 DIAGNOSIS — H6980 Other specified disorders of Eustachian tube, unspecified ear: Secondary | ICD-10-CM | POA: Diagnosis not present

## 2018-05-28 LAB — CBC WITH DIFFERENTIAL/PLATELET
Basophils Absolute: 0 10*3/uL (ref 0.0–0.1)
Basophils Relative: 0.4 % (ref 0.0–3.0)
Eosinophils Absolute: 0.1 10*3/uL (ref 0.0–0.7)
Eosinophils Relative: 2.3 % (ref 0.0–5.0)
HCT: 45 % (ref 36.0–46.0)
Hemoglobin: 14.9 g/dL (ref 12.0–15.0)
Lymphocytes Relative: 23.5 % (ref 12.0–46.0)
Lymphs Abs: 1.6 10*3/uL (ref 0.7–4.0)
MCHC: 33 g/dL (ref 30.0–36.0)
MCV: 85.3 fl (ref 78.0–100.0)
Monocytes Absolute: 0.4 10*3/uL (ref 0.1–1.0)
Monocytes Relative: 5.8 % (ref 3.0–12.0)
Neutro Abs: 4.5 10*3/uL (ref 1.4–7.7)
Neutrophils Relative %: 68 % (ref 43.0–77.0)
Platelets: 329 10*3/uL (ref 150.0–400.0)
RBC: 5.27 Mil/uL — ABNORMAL HIGH (ref 3.87–5.11)
RDW: 13.5 % (ref 11.5–15.5)
WBC: 6.6 10*3/uL (ref 4.0–10.5)

## 2018-05-28 LAB — POCT GLYCOSYLATED HEMOGLOBIN (HGB A1C): Hemoglobin A1C: 10.7 % — AB (ref 4.0–5.6)

## 2018-05-28 LAB — POC INFLUENZA A&B (BINAX/QUICKVUE)
Influenza A, POC: NEGATIVE
Influenza B, POC: NEGATIVE

## 2018-05-28 MED ORDER — METOPROLOL SUCCINATE ER 25 MG PO TB24: 25.0000 mg | ORAL_TABLET | Freq: Every day | ORAL | 0 refills | Status: DC

## 2018-05-28 MED ORDER — ATORVASTATIN CALCIUM 40 MG PO TABS: ORAL_TABLET | ORAL | 3 refills | Status: DC

## 2018-05-28 NOTE — Assessment & Plan Note (Signed)
Repeat lipids next visit as she's been out of her atorvastatin.
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
## 2018-05-28 NOTE — Assessment & Plan Note (Addendum)
Will be seeing ENT today. Influenza testing negative today. CBC and repeat lactic acid pending.

## 2018-05-28 NOTE — Assessment & Plan Note (Addendum)
Uncontrolled with A1C of 10.7 today. Increase Lantus to 30 units daily. Strongly advised she start checking her glucose levels twice daily, rotating times of checks. Glucose logs provided for her to use.  Refilled atorvastatin. Managed on ARB. Discussed to schedule eye exam.   Follow up in 3 weeks with glucose logs. Continue metformin and jardiance.

## 2018-05-28 NOTE — Patient Instructions (Addendum)
I sent refills of your cholesterol medication, atorvastatin. Start taking this daily immediately.  Start metoprolol succinate 25 mg daily for heart rate and blood pressure.   Stop drinking soda and sweet tea. Use sugar free flavor packets instead.  Increase your Lantus to 30 units daily. Continue jardiance and metformin.  You MUST start checking your blood sugars. Start checking 2 times daily rotating times:  Before any meal. 2 hours after any meal. Bedtime  Schedule a follow up visit in 3 weeks for blood pressure check and diabetes check. Bring your glucose logs. Come fasting so we can check your cholesterol.   It was a pleasure to see you today!   Diabetes Mellitus and Nutrition When you have diabetes (diabetes mellitus), it is very important to have healthy eating habits because your blood sugar (glucose) levels are greatly affected by what you eat and drink. Eating healthy foods in the appropriate amounts, at about the same times every day, can help you:  Control your blood glucose.  Lower your risk of heart disease.  Improve your blood pressure.  Reach or maintain a healthy weight.  Every person with diabetes is different, and each person has different needs for a meal plan. Your health care provider may recommend that you work with a diet and nutrition specialist (dietitian) to make a meal plan that is best for you. Your meal plan may vary depending on factors such as:  The calories you need.  The medicines you take.  Your weight.  Your blood glucose, blood pressure, and cholesterol levels.  Your activity level.  Other health conditions you have, such as heart or kidney disease.  How do carbohydrates affect me? Carbohydrates affect your blood glucose level more than any other type of food. Eating carbohydrates naturally increases the amount of glucose in your blood. Carbohydrate counting is a method for keeping track of how many carbohydrates you eat. Counting  carbohydrates is important to keep your blood glucose at a healthy level, especially if you use insulin or take certain oral diabetes medicines. It is important to know how many carbohydrates you can safely have in each meal. This is different for every person. Your dietitian can help you calculate how many carbohydrates you should have at each meal and for snack. Foods that contain carbohydrates include:  Bread, cereal, rice, pasta, and crackers.  Potatoes and corn.  Peas, beans, and lentils.  Milk and yogurt.  Fruit and juice.  Desserts, such as cakes, cookies, ice cream, and candy.  How does alcohol affect me? Alcohol can cause a sudden decrease in blood glucose (hypoglycemia), especially if you use insulin or take certain oral diabetes medicines. Hypoglycemia can be a life-threatening condition. Symptoms of hypoglycemia (sleepiness, dizziness, and confusion) are similar to symptoms of having too much alcohol. If your health care provider says that alcohol is safe for you, follow these guidelines:  Limit alcohol intake to no more than 1 drink per day for nonpregnant women and 2 drinks per day for men. One drink equals 12 oz of beer, 5 oz of wine, or 1 oz of hard liquor.  Do not drink on an empty stomach.  Keep yourself hydrated with water, diet soda, or unsweetened iced tea.  Keep in mind that regular soda, juice, and other mixers may contain a lot of sugar and must be counted as carbohydrates.  What are tips for following this plan? Reading food labels  Start by checking the serving size on the label. The amount of  calories, carbohydrates, fats, and other nutrients listed on the label are based on one serving of the food. Many foods contain more than one serving per package.  Check the total grams (g) of carbohydrates in one serving. You can calculate the number of servings of carbohydrates in one serving by dividing the total carbohydrates by 15. For example, if a food has 30 g  of total carbohydrates, it would be equal to 2 servings of carbohydrates.  Check the number of grams (g) of saturated and trans fats in one serving. Choose foods that have low or no amount of these fats.  Check the number of milligrams (mg) of sodium in one serving. Most people should limit total sodium intake to less than 2,300 mg per day.  Always check the nutrition information of foods labeled as "low-fat" or "nonfat". These foods may be higher in added sugar or refined carbohydrates and should be avoided.  Talk to your dietitian to identify your daily goals for nutrients listed on the label. Shopping  Avoid buying canned, premade, or processed foods. These foods tend to be high in fat, sodium, and added sugar.  Shop around the outside edge of the grocery store. This includes fresh fruits and vegetables, bulk grains, fresh meats, and fresh dairy. Cooking  Use low-heat cooking methods, such as baking, instead of high-heat cooking methods like deep frying.  Cook using healthy oils, such as olive, canola, or sunflower oil.  Avoid cooking with butter, cream, or high-fat meats. Meal planning  Eat meals and snacks regularly, preferably at the same times every day. Avoid going long periods of time without eating.  Eat foods high in fiber, such as fresh fruits, vegetables, beans, and whole grains. Talk to your dietitian about how many servings of carbohydrates you can eat at each meal.  Eat 4-6 ounces of lean protein each day, such as lean meat, chicken, fish, eggs, or tofu. 1 ounce is equal to 1 ounce of meat, chicken, or fish, 1 egg, or 1/4 cup of tofu.  Eat some foods each day that contain healthy fats, such as avocado, nuts, seeds, and fish. Lifestyle   Check your blood glucose regularly.  Exercise at least 30 minutes 5 or more days each week, or as told by your health care provider.  Take medicines as told by your health care provider.  Do not use any products that contain  nicotine or tobacco, such as cigarettes and e-cigarettes. If you need help quitting, ask your health care provider.  Work with a Veterinary surgeon or diabetes educator to identify strategies to manage stress and any emotional and social challenges. What are some questions to ask my health care provider?  Do I need to meet with a diabetes educator?  Do I need to meet with a dietitian?  What number can I call if I have questions?  When are the best times to check my blood glucose? Where to find more information:  American Diabetes Association: diabetes.org/food-and-fitness/food  Academy of Nutrition and Dietetics: https://www.vargas.com/  [employer] of Diabetes and Digestive and Kidney Diseases (NIH): FindJewelers.cz Summary  A healthy meal plan will help you control your blood glucose and maintain a healthy lifestyle.  Working with a diet and nutrition specialist (dietitian) can help you make a meal plan that is best for you.  Keep in mind that carbohydrates and alcohol have immediate effects on your blood glucose levels. It is important to count carbohydrates and to use alcohol carefully. This information is not intended to  replace advice given to you by your health care provider. Make sure you discuss any questions you have with your health care provider. Document Released: 04/18/2005 Document Revised: 08/26/2016 Document Reviewed: 08/26/2016 Elsevier Interactive Patient Education  [employer].

## 2018-05-28 NOTE — Assessment & Plan Note (Signed)
Borderline, also with tachycardia that is evident during nearly every visit. Continue losartan. Add in metoprolol succinate 25 mg daily. We will see her back in the office in 3 weeks for BP check.

## 2018-05-28 NOTE — Assessment & Plan Note (Signed)
Noted on nearly every exam. Could be secondary to psychiatric meds. Rx for metoprolol succinate 25 mg provided. Will see her back in 3 weeks for BP check.

## 2018-05-29 LAB — LACTIC ACID, PLASMA: LACTIC ACID: 2.5 mmol/L — ABNORMAL HIGH (ref 0.4–1.8)

## 2018-06-01 DIAGNOSIS — G4710 Hypersomnia, unspecified: Secondary | ICD-10-CM | POA: Diagnosis not present

## 2018-06-03 DIAGNOSIS — Z20828 Contact with and (suspected) exposure to other viral communicable diseases: Secondary | ICD-10-CM | POA: Diagnosis not present

## 2018-06-03 DIAGNOSIS — J069 Acute upper respiratory infection, unspecified: Secondary | ICD-10-CM | POA: Diagnosis not present

## 2018-06-05 ENCOUNTER — Telehealth: Payer: Self-pay | Admitting: Internal Medicine

## 2018-06-05 DIAGNOSIS — G4710 Hypersomnia, unspecified: Secondary | ICD-10-CM | POA: Diagnosis not present

## 2018-06-05 NOTE — Telephone Encounter (Signed)
Please call regarding sleep study results.  

## 2018-06-05 NOTE — Telephone Encounter (Signed)
Recommend avoidance of sleeping the supine position. If sx persist consider repeat HST in supine position, or referral for in-lab sleep study.  Pt will notify is she wishes to have another study in the future. Nothing further needed.

## 2018-06-09 ENCOUNTER — Ambulatory Visit: Payer: BLUE CROSS/BLUE SHIELD | Admitting: Psychiatry

## 2018-06-16 DIAGNOSIS — J111 Influenza due to unidentified influenza virus with other respiratory manifestations: Secondary | ICD-10-CM | POA: Diagnosis not present

## 2018-06-16 DIAGNOSIS — R509 Fever, unspecified: Secondary | ICD-10-CM | POA: Diagnosis not present

## 2018-06-18 ENCOUNTER — Ambulatory Visit: Payer: Self-pay | Admitting: Primary Care

## 2018-06-18 DIAGNOSIS — Z0289 Encounter for other administrative examinations: Secondary | ICD-10-CM

## 2018-06-20 ENCOUNTER — Other Ambulatory Visit: Payer: Self-pay | Admitting: Psychiatry

## 2018-06-20 DIAGNOSIS — F3161 Bipolar disorder, current episode mixed, mild: Secondary | ICD-10-CM

## 2018-06-20 DIAGNOSIS — F4310 Post-traumatic stress disorder, unspecified: Secondary | ICD-10-CM

## 2018-06-22 DIAGNOSIS — J189 Pneumonia, unspecified organism: Secondary | ICD-10-CM | POA: Diagnosis not present

## 2018-06-22 DIAGNOSIS — J0141 Acute recurrent pansinusitis: Secondary | ICD-10-CM | POA: Diagnosis not present

## 2018-06-22 DIAGNOSIS — J029 Acute pharyngitis, unspecified: Secondary | ICD-10-CM | POA: Diagnosis not present

## 2018-06-23 ENCOUNTER — Encounter: Payer: Self-pay | Admitting: Internal Medicine

## 2018-06-23 ENCOUNTER — Ambulatory Visit (INDEPENDENT_AMBULATORY_CARE_PROVIDER_SITE_OTHER): Payer: BLUE CROSS/BLUE SHIELD | Admitting: Internal Medicine

## 2018-06-23 VITALS — BP 126/84 | HR 94 | Temp 98.5°F | Wt 281.0 lb

## 2018-06-23 DIAGNOSIS — B9789 Other viral agents as the cause of diseases classified elsewhere: Secondary | ICD-10-CM

## 2018-06-23 DIAGNOSIS — R509 Fever, unspecified: Secondary | ICD-10-CM

## 2018-06-23 DIAGNOSIS — R52 Pain, unspecified: Secondary | ICD-10-CM

## 2018-06-23 DIAGNOSIS — R112 Nausea with vomiting, unspecified: Secondary | ICD-10-CM | POA: Diagnosis not present

## 2018-06-23 DIAGNOSIS — J329 Chronic sinusitis, unspecified: Secondary | ICD-10-CM | POA: Diagnosis not present

## 2018-06-23 MED ORDER — ONDANSETRON HCL 4 MG PO TABS: 4.0000 mg | ORAL_TABLET | Freq: Three times a day (TID) | ORAL | 0 refills | Status: DC | PRN

## 2018-06-23 NOTE — Patient Instructions (Signed)
Vomiting, Adult  Vomiting occurs when stomach contents are thrown up and out of the mouth. Many people notice nausea before vomiting. Vomiting can make you feel weak and dehydrated. Dehydration can make you tired and thirsty, cause you to have a dry mouth, and decrease how often you urinate. Older adults and people who have other diseases or a weak immune system are at higher risk for dehydration.It is important to treat vomiting as told by your health care provider.  Follow these instructions at home:  Follow your health care provider's instructions about how to care for yourself at home.  Eating and drinking  Follow these recommendations as told by your health care provider:   Take an oral rehydration solution (ORS). This is a drink that is sold at pharmacies and retail stores.   Eat bland, easy-to-digest foods in small amounts as you are able. These foods include bananas, applesauce, rice, lean meats, toast, and crackers.   Drink clear fluids in small amounts as you are able. Clear fluids include water, ice chips, low-calorie sports drinks, and fruit juice that has water added (diluted fruit juice).   Avoid fluids that contain a lot of sugar or caffeine.   Avoid alcohol and foods that are spicy or fatty.    General instructions     Wash your hands frequently with soap and water. If soap and water are not available, use hand sanitizer. Make sure that everyone in your household washes their hands frequently.   Take over-the-counter and prescription medicines only as told by your health care provider.   Watch your condition for any changes.   Keep all follow-up visits as told by your health care provider. This is important.  Contact a health care provider if:   You have a fever.   You are not able to keep fluids down.   Your vomiting gets worse.   You have new symptoms.   You feel light-headed or dizzy.   You have a headache.   You have muscle cramps.  Get help right away if:   You have pain in  your chest, neck, arm, or jaw.   You feel extremely weak or you faint.   You have persistent vomiting.   You have vomit that is bright red or looks like black coffee grounds.   You have stools that are bloody or black, or stools that look like tar.   You have severe pain, cramping, or bloating in your abdomen.   You have a severe headache, a stiff neck, or both.   You have a rash.   You have trouble breathing or you are breathing very quickly.   Your heart is beating very quickly.   Your skin feels cold and clammy.   You feel confused.   You have pain while urinating.   You have signs of dehydration, such as:  ? Dark urine, or very little or no urine.  ? Cracked lips.  ? Dry mouth.  ? Sunken eyes.  ? Sleepiness.  ? Weakness.  These symptoms may represent a serious problem that is an emergency. Do not wait to see if the symptoms will go away. Get medical help right away. Call your local emergency services (911 in the [place] Do not drive yourself to the hospital.  This information is not intended to replace advice given to you by your health care provider. Make sure you discuss any questions you have with your health care provider.  Document Released: 08/18/2015 Document Revised: 12/28/2015 Document   Reviewed: 03/28/2015  Elsevier Interactive Patient Education  2018 [employer]
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
## 2018-06-24 ENCOUNTER — Ambulatory Visit: Payer: Self-pay | Admitting: Internal Medicine

## 2018-06-24 ENCOUNTER — Other Ambulatory Visit: Payer: Self-pay | Admitting: Primary Care

## 2018-06-24 DIAGNOSIS — R000 Tachycardia, unspecified: Secondary | ICD-10-CM

## 2018-06-24 DIAGNOSIS — I10 Essential (primary) hypertension: Secondary | ICD-10-CM

## 2018-06-24 NOTE — Progress Notes (Deleted)
[name]      Assessment and Plan:  Asthma, likely allergic. -Severe persistent asthma with daily symptoms, poor control, though improving over the last few weeks. - We will prescribe Advair to be continued.  She is given a prescription for nebulizer and DuoNeb's to be used as needed, as she notes that she felt better with these in the past, and feels that the albuterol metered-dose inhaler does little for her. - Likely allergic component with  positive pet allergies with both the dog and cat living with her and in the bedroom.  I advised her to remove pets from the bedroom, will start Singulair, asked her to take an antihistamine over-the-counter.  Diabetes mellitus. - Patient's blood sugars have been elevated due to poor asthma control as well as multiple courses of prednisone. - We will see if we can control her asthma better, and that this should help with blood sugar control.  Excessive daytime sleepiness.  Obesity. - Symptoms and signs of obstructive sleep apnea. - We will send for sleep study once her respiratory symptoms have resolved.  Will start CPAP as needed.  No orders of the defined types were placed in this encounter.  No orders of the defined types were placed in this encounter.  No follow-ups on file.   Date: 06/24/2018  MRN# [id number] Joan [name] 05/20/1981  Referring Physician: NP Carlis Abbott.   Joan [name] is a 37 y.o. old female seen in consultation for chief complaint of:    No chief complaint on file.   HPI:  Patient is a 37 year old female with a history of asthma, last visit she had persistent symptoms of dyspnea, she is required multiple courses of prednisone which in turn have caused her blood glucose control to worsen.  At last visit she was asked to use Dulera, Singulair, DuoNeb's.  He also has a history of documented pet allergies, she was recommended to move her dog out of the bedroom. At last visit she was  referred for sleep study  She does have occasional reflux, once or twice per week. She has 2 dogs and a cat, they are not new. 1 dog sleeps in the bed, and a cat sleeps in the room.  Her last prednisone was about 2 weeks ago.   She snores at night, she has never been tested for OSA, she does feel sleepy during the day. Goes to bed at 9 pm, She wakes up constantly at night, wakes at 7 am. She works in a daycare. If she is not working and sleeps in on non-working days but still feels sleepy and her sleep un unrefreshing.   She uses her albuterol via spacer, her inhaler technique was monitored today and was effective. Recommend that she take a full breath and hold it.  **HST?? **Chest x-ray 10/23/2017, lung bases on CT abdomen pelvis 02/12/2018>> imaging personally reviewed, lungs are unremarkable. **CBC 02/12/2018>> absolute eosinophil count is 200 **Rast testing 01/21/2017>> positive for mold, dog, oak.  Strongly positive for cat dander.  Mildly positive for grass.  Medication:    Current Outpatient Medications:  .  ARIPiprazole (ABILIFY) 5 MG tablet, Take 1.5 tablets (7.5 mg total) by mouth daily., Disp: 45 tablet, Rfl: 1 .  atorvastatin (LIPITOR) 40 MG tablet, Take 1 tablet by mouth at bedtime for cholesterol., Disp: 90 tablet, Rfl: 3 .  blood glucose meter kit and supplies KIT, Dispense based on patient and insurance preference. Use up three times daily as directed. (FOR  ICD-9 250.00, 250.01)., Disp: 1 each, Rfl: 0 .  citalopram (CELEXA) 20 MG tablet, Take 1 tablet (20 mg total) by mouth daily., Disp: 90 tablet, Rfl: 0 .  doxepin (SINEQUAN) 10 MG capsule, TAKE 1 TO 2 CAPSULES(10 TO 20 MG) BY MOUTH AT BEDTIME FOR SLEEP, Disp: 60 capsule, Rfl: 0 .  empagliflozin (JARDIANCE) 25 MG TABS tablet, Take 25 mg by mouth daily., Disp: 90 mg, Rfl: 3 .  fluticasone (FLONASE) 50 MCG/ACT nasal spray, Place into the nose., Disp: , Rfl:  .  hydrOXYzine (ATARAX/VISTARIL) 50 MG tablet, , Disp: , Rfl: 0 .   Insulin Glargine (LANTUS) 100 UNIT/ML Solostar Pen, Inject 24 Units into the skin every evening., Disp: 15 mL, Rfl: 5 .  Insulin Pen Needle (PEN NEEDLES) 31G X 6 MM MISC, Use with insulin as directed., Disp: 100 each, Rfl: 2 .  ipratropium-albuterol (DUONEB) 0.5-2.5 (3) MG/3ML SOLN, Take 3 mLs by nebulization every 6 (six) hours as needed., Disp: 360 mL, Rfl: 5 .  losartan (COZAAR) 100 MG tablet, Take 1 tablet by mouth once daily for blood pressure., Disp: 90 tablet, Rfl: 3 .  metFORMIN (GLUCOPHAGE) 1000 MG tablet, Take 1 tablet (1,000 mg total) by mouth 2 (two) times daily with a meal., Disp: 180 tablet, Rfl: 3 .  metoprolol succinate (TOPROL-XL) 25 MG 24 hr tablet, Take 1 tablet (25 mg total) by mouth daily. For heart rate and blood pressure., Disp: 30 tablet, Rfl: 0 .  montelukast (SINGULAIR) 10 MG tablet, Take 1 tablet (10 mg total) by mouth at bedtime., Disp: 30 tablet, Rfl: 3 .  ondansetron (ZOFRAN) 4 MG tablet, Take 1 tablet (4 mg total) by mouth every 8 (eight) hours as needed., Disp: 20 tablet, Rfl: 0 .  Spacer/Aero-Holding Chambers (AEROCHAMBER PLUS) inhaler, Use as instructed. Generic spacer if possible, Disp: 1 each, Rfl: 2 .  VENTOLIN HFA 108 (90 Base) MCG/ACT inhaler, INHALE 2 PUFFS BY MOUTH EVERY 6 HOURS AS NEEDED FOR SHORTNESS OF BREATH AND WHEEZING, Disp: 18 g, Rfl: 0 .  WIXELA INHUB 250-50 MCG/DOSE AEPB, INHALE 1 PUFF INTO THE LUNGS TWICE DAILY. RINSE MOUTH AFTER USE, Disp: 60 each, Rfl: 2   Allergies:  Ciprofloxacin; Ibuprofen; Penicillins; and Sulfa antibiotics      LABORATORY PANEL:   CBC No results for input(s): WBC, HGB, HCT, PLT in the last 168 hours. ------------------------------------------------------------------------------------------------------------------  Chemistries  No results for input(s): NA, K, CL, CO2, GLUCOSE, BUN, CREATININE, CALCIUM, MG, AST, ALT, ALKPHOS, BILITOT in the last 168 hours.  Invalid input(s):  GFRCGP ------------------------------------------------------------------------------------------------------------------  Cardiac Enzymes No results for input(s): TROPONINI in the last 168 hours. ------------------------------------------------------------  RADIOLOGY:  No results found.     Thank  you for the consultation and for allowing [facility] to assist in the care of your patient. Our recommendations are noted above.  Please contact us if we can be of further service.   Marda Stalker, M.D., F.C.C.P.  Board Certified in Internal Medicine, Pulmonary Medicine, [place], and Sleep Medicine.  [facility] Office Number: [phone]   06/24/2018

## 2018-06-25 ENCOUNTER — Encounter: Payer: Self-pay | Admitting: Primary Care

## 2018-06-25 ENCOUNTER — Ambulatory Visit: Payer: BLUE CROSS/BLUE SHIELD | Admitting: Primary Care

## 2018-06-25 VITALS — BP 124/80 | HR 87 | Temp 98.2°F | Ht 66.0 in | Wt 282.2 lb

## 2018-06-25 DIAGNOSIS — R7989 Other specified abnormal findings of blood chemistry: Secondary | ICD-10-CM

## 2018-06-25 DIAGNOSIS — Z794 Long term (current) use of insulin: Secondary | ICD-10-CM

## 2018-06-25 DIAGNOSIS — E785 Hyperlipidemia, unspecified: Secondary | ICD-10-CM

## 2018-06-25 DIAGNOSIS — I10 Essential (primary) hypertension: Secondary | ICD-10-CM | POA: Diagnosis not present

## 2018-06-25 DIAGNOSIS — E119 Type 2 diabetes mellitus without complications: Secondary | ICD-10-CM

## 2018-06-25 MED ORDER — INSULIN GLARGINE 100 UNIT/ML SOLOSTAR PEN: PEN_INJECTOR | SUBCUTANEOUS | 5 refills | Status: DC

## 2018-06-25 MED ORDER — DULAGLUTIDE 0.75 MG/0.5ML ~~LOC~~ SOAJ: SUBCUTANEOUS | 1 refills | Status: DC

## 2018-06-25 NOTE — Patient Instructions (Signed)
Continue Lantus 30 units in the morning and 20 units in the evening.  Start Trulicity. Inject 0.75 mg into the skin once weekly.  Continue Jardiance 25 mg.   Start taking your atorvastatin medication everyday.  Do not take Metformin for diabetes or metoprolol succinate for blood pressure.  Continue to check your blood sugars, rotate times of checks:  Before breakfast, lunch, dinner 2 hours after breakfast, lunch, dinner Bedtime  Schedule a follow up visit in 4 weeks for diabetes check. Come fasting so we can check your cholesterol.   It was a pleasure to see you today!   Diabetes Mellitus and Nutrition When you have diabetes (diabetes mellitus), it is very important to have healthy eating habits because your blood sugar (glucose) levels are greatly affected by what you eat and drink. Eating healthy foods in the appropriate amounts, at about the same times every day, can help you:  Control your blood glucose.  Lower your risk of heart disease.  Improve your blood pressure.  Reach or maintain a healthy weight.  Every person with diabetes is different, and each person has different needs for a meal plan. Your health care provider may recommend that you work with a diet and nutrition specialist (dietitian) to make a meal plan that is best for you. Your meal plan may vary depending on factors such as:  The calories you need.  The medicines you take.  Your weight.  Your blood glucose, blood pressure, and cholesterol levels.  Your activity level.  Other health conditions you have, such as heart or kidney disease.  How do carbohydrates affect me? Carbohydrates affect your blood glucose level more than any other type of food. Eating carbohydrates naturally increases the amount of glucose in your blood. Carbohydrate counting is a method for keeping track of how many carbohydrates you eat. Counting carbohydrates is important to keep your blood glucose at a healthy level,  especially if you use insulin or take certain oral diabetes medicines. It is important to know how many carbohydrates you can safely have in each meal. This is different for every person. Your dietitian can help you calculate how many carbohydrates you should have at each meal and for snack. Foods that contain carbohydrates include:  Bread, cereal, rice, pasta, and crackers.  Potatoes and corn.  Peas, beans, and lentils.  Milk and yogurt.  Fruit and juice.  Desserts, such as cakes, cookies, ice cream, and candy.  How does alcohol affect me? Alcohol can cause a sudden decrease in blood glucose (hypoglycemia), especially if you use insulin or take certain oral diabetes medicines. Hypoglycemia can be a life-threatening condition. Symptoms of hypoglycemia (sleepiness, dizziness, and confusion) are similar to symptoms of having too much alcohol. If your health care provider says that alcohol is safe for you, follow these guidelines:  Limit alcohol intake to no more than 1 drink per day for nonpregnant women and 2 drinks per day for men. One drink equals 12 oz of beer, 5 oz of wine, or 1 oz of hard liquor.  Do not drink on an empty stomach.  Keep yourself hydrated with water, diet soda, or unsweetened iced tea.  Keep in mind that regular soda, juice, and other mixers may contain a lot of sugar and must be counted as carbohydrates.  What are tips for following this plan? Reading food labels  Start by checking the serving size on the label. The amount of calories, carbohydrates, fats, and other nutrients listed on the label are based  on one serving of the food. Many foods contain more than one serving per package.  Check the total grams (g) of carbohydrates in one serving. You can calculate the number of servings of carbohydrates in one serving by dividing the total carbohydrates by 15. For example, if a food has 30 g of total carbohydrates, it would be equal to 2 servings of  carbohydrates.  Check the number of grams (g) of saturated and trans fats in one serving. Choose foods that have low or no amount of these fats.  Check the number of milligrams (mg) of sodium in one serving. Most people should limit total sodium intake to less than 2,300 mg per day.  Always check the nutrition information of foods labeled as "low-fat" or "nonfat". These foods may be higher in added sugar or refined carbohydrates and should be avoided.  Talk to your dietitian to identify your daily goals for nutrients listed on the label. Shopping  Avoid buying canned, premade, or processed foods. These foods tend to be high in fat, sodium, and added sugar.  Shop around the outside edge of the grocery store. This includes fresh fruits and vegetables, bulk grains, fresh meats, and fresh dairy. Cooking  Use low-heat cooking methods, such as baking, instead of high-heat cooking methods like deep frying.  Cook using healthy oils, such as olive, canola, or sunflower oil.  Avoid cooking with butter, cream, or high-fat meats. Meal planning  Eat meals and snacks regularly, preferably at the same times every day. Avoid going long periods of time without eating.  Eat foods high in fiber, such as fresh fruits, vegetables, beans, and whole grains. Talk to your dietitian about how many servings of carbohydrates you can eat at each meal.  Eat 4-6 ounces of lean protein each day, such as lean meat, chicken, fish, eggs, or tofu. 1 ounce is equal to 1 ounce of meat, chicken, or fish, 1 egg, or 1/4 cup of tofu.  Eat some foods each day that contain healthy fats, such as avocado, nuts, seeds, and fish. Lifestyle   Check your blood glucose regularly.  Exercise at least 30 minutes 5 or more days each week, or as told by your health care provider.  Take medicines as told by your health care provider.  Do not use any products that contain nicotine or tobacco, such as cigarettes and e-cigarettes. If  you need help quitting, ask your health care provider.  Work with a Social worker or diabetes educator to identify strategies to manage stress and any emotional and social challenges. What are some questions to ask my health care provider?  Do I need to meet with a diabetes educator?  Do I need to meet with a dietitian?  What number can I call if I have questions?  When are the best times to check my blood glucose? Where to find more information:  American Diabetes Association: diabetes.org/food-and-fitness/food  Academy of Nutrition and Dietetics: PokerClues.dk  [employer] of Diabetes and Digestive and Kidney Diseases (NIH): ContactWire.be Summary  A healthy meal plan will help you control your blood glucose and maintain a healthy lifestyle.  Working with a diet and nutrition specialist (dietitian) can help you make a meal plan that is best for you.  Keep in mind that carbohydrates and alcohol have immediate effects on your blood glucose levels. It is important to count carbohydrates and to use alcohol carefully. This information is not intended to replace advice given to you by your health care provider. Make sure  you discuss any questions you have with your health care provider. Document Released: 04/18/2005 Document Revised: 08/26/2016 Document Reviewed: 08/26/2016 Elsevier Interactive Patient Education  [employer].

## 2018-06-25 NOTE — Assessment & Plan Note (Addendum)
Uncontrolled. Given that she's seeing better numbers with Lantus 30 units in the morning and 20 units in the evening, will continue. Add in Trulicity 0.75 mg once weekly for better diabetes control, decrease food cravings, and in hopes of reducing insulin dose.  She will schedule her eye exam.  She will monitor glucose readings and follow up in one month with logs.   Repeat lactic acid level today. Continue to hold off of metformin.

## 2018-06-25 NOTE — Assessment & Plan Note (Signed)
Never started metoprolol succinate as HR and BP improved. Continue on losartan 100 mg. BP and HR stable today. Continue to monitor.

## 2018-06-25 NOTE — Assessment & Plan Note (Signed)
Not taking atorvastatin consistently, mostly 1-2 days week. Discussed to take this daily.  Repeat lipids at next visit.
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
## 2018-06-29 ENCOUNTER — Ambulatory Visit: Payer: Self-pay | Admitting: Internal Medicine

## 2018-06-29 LAB — LACTIC ACID, PLASMA: LACTIC ACID: 1.1 mmol/L (ref 0.4–1.8)

## 2018-06-30 ENCOUNTER — Encounter: Payer: Self-pay | Admitting: Internal Medicine

## 2018-07-04 ENCOUNTER — Other Ambulatory Visit: Payer: Self-pay | Admitting: Primary Care

## 2018-07-04 DIAGNOSIS — F332 Major depressive disorder, recurrent severe without psychotic features: Secondary | ICD-10-CM

## 2018-07-05 ENCOUNTER — Emergency Department: Payer: BLUE CROSS/BLUE SHIELD

## 2018-07-05 ENCOUNTER — Emergency Department
Admission: EM | Admit: 2018-07-05 | Discharge: 2018-07-05 | Disposition: A | Discharge status: Home / Self Care | Payer: BLUE CROSS/BLUE SHIELD | Attending: Emergency Medicine | Admitting: Emergency Medicine

## 2018-07-05 ENCOUNTER — Encounter: Payer: Self-pay | Admitting: Emergency Medicine

## 2018-07-05 ENCOUNTER — Other Ambulatory Visit: Payer: Self-pay

## 2018-07-05 DIAGNOSIS — R1031 Right lower quadrant pain: Secondary | ICD-10-CM | POA: Diagnosis not present

## 2018-07-05 DIAGNOSIS — R10813 Right lower quadrant abdominal tenderness: Secondary | ICD-10-CM | POA: Diagnosis not present

## 2018-07-05 DIAGNOSIS — Z79899 Other long term (current) drug therapy: Secondary | ICD-10-CM | POA: Diagnosis not present

## 2018-07-05 DIAGNOSIS — R1115 Cyclical vomiting syndrome unrelated to migraine: Secondary | ICD-10-CM | POA: Diagnosis not present

## 2018-07-05 DIAGNOSIS — Z3202 Encounter for pregnancy test, result negative: Secondary | ICD-10-CM | POA: Insufficient documentation

## 2018-07-05 DIAGNOSIS — E119 Type 2 diabetes mellitus without complications: Secondary | ICD-10-CM | POA: Diagnosis not present

## 2018-07-05 DIAGNOSIS — R109 Unspecified abdominal pain: Secondary | ICD-10-CM | POA: Diagnosis not present

## 2018-07-05 DIAGNOSIS — I10 Essential (primary) hypertension: Secondary | ICD-10-CM | POA: Diagnosis not present

## 2018-07-05 DIAGNOSIS — E279 Disorder of adrenal gland, unspecified: Secondary | ICD-10-CM | POA: Diagnosis not present

## 2018-07-05 DIAGNOSIS — Z794 Long term (current) use of insulin: Secondary | ICD-10-CM | POA: Diagnosis not present

## 2018-07-05 DIAGNOSIS — R102 Pelvic and perineal pain: Secondary | ICD-10-CM

## 2018-07-05 DIAGNOSIS — R112 Nausea with vomiting, unspecified: Secondary | ICD-10-CM | POA: Insufficient documentation

## 2018-07-05 LAB — COMPREHENSIVE METABOLIC PANEL
ALT: 18 U/L (ref 0–44)
AST: 18 U/L (ref 15–41)
Albumin: 4 g/dL (ref 3.5–5.0)
Alkaline Phosphatase: 95 U/L (ref 38–126)
Anion gap: 9 (ref 5–15)
BUN: 13 mg/dL (ref 6–20)
CO2: 26 mmol/L (ref 22–32)
Calcium: 9.2 mg/dL (ref 8.9–10.3)
Chloride: 104 mmol/L (ref 98–111)
Creatinine, Ser: 0.57 mg/dL (ref 0.44–1.00)
GFR calc Af Amer: >60 mL/min (ref >60)
GFR calc non Af Amer: >60 mL/min (ref >60)
Glucose, Bld: 163 mg/dL — ABNORMAL HIGH (ref 70–99)
Potassium: 4.4 mmol/L (ref 3.5–5.1)
Sodium: 139 mmol/L (ref 135–145)
Total Bilirubin: 0.5 mg/dL (ref 0.3–1.2)
Total Protein: 7.4 g/dL (ref 6.5–8.1)

## 2018-07-05 LAB — CBC
HCT: 43.7 % (ref 36.0–46.0)
Hemoglobin: 14.3 g/dL (ref 12.0–15.0)
MCH: 27.8 pg (ref 26.0–34.0)
MCHC: 32.7 g/dL (ref 30.0–36.0)
MCV: 84.9 fL (ref 80.0–100.0)
Platelets: 318 10*3/uL (ref 150–400)
RBC: 5.15 MIL/uL — ABNORMAL HIGH (ref 3.87–5.11)
RDW: 12.9 % (ref 11.5–15.5)
WBC: 7.1 10*3/uL (ref 4.0–10.5)
nRBC: 0 % (ref 0.0–0.2)

## 2018-07-05 LAB — LIPASE, BLOOD: Lipase: 24 U/L (ref 11–51)

## 2018-07-05 LAB — HCG, QUANTITATIVE, PREGNANCY: hCG, Beta Chain, Quant, S: <1 m[IU]/mL (ref <5)

## 2018-07-05 MED ORDER — IOHEXOL 300 MG/ML  SOLN
125.0000 mL | Freq: Once | INTRAMUSCULAR | Status: AC | PRN
  Administered 2018-07-05: 150 mL via INTRAVENOUS

## 2018-07-05 MED ORDER — ONDANSETRON HCL 4 MG PO TABS: 4.0000 mg | ORAL_TABLET | Freq: Three times a day (TID) | ORAL | 0 refills | Status: DC | PRN

## 2018-07-05 MED ORDER — DICYCLOMINE HCL 20 MG PO TABS: 20.0000 mg | ORAL_TABLET | Freq: Three times a day (TID) | ORAL | 0 refills | Status: DC | PRN

## 2018-07-05 MED ORDER — SODIUM CHLORIDE 0.9 % IV BOLUS
1000.0000 mL | Freq: Once | INTRAVENOUS | Status: AC
  Administered 2018-07-05: 1000 mL via INTRAVENOUS

## 2018-07-05 NOTE — ED Triage Notes (Signed)
Pt arrived with complaints of emesis for 2 weeks and acute onset of lower right abdominal pain that started yesterday. Pt states the pain feels like a cramping. Pt reports 6-7 episodes of emesis daily for the last two weeks.

## 2018-07-05 NOTE — Discharge Instructions (Addendum)
Please seek medical attention for any high fevers, chest pain, shortness of breath, change in behavior, persistent vomiting, bloody stool or any other new or concerning symptoms.  

## 2018-07-05 NOTE — ED Provider Notes (Signed)
[facility] Emergency Department Provider Note   ____________________________________________   I have reviewed the triage vital signs and the nursing notes.   HISTORY  Chief Complaint Emesis and Abdominal Pain   History limited by: Not Limited   HPI Joan [name] is a 37 y.o. female who presents to the emergency department today because of concerns for continued nausea vomiting as well as right lower quadrant pain.  Patient states she has had nausea and vomiting for the past 2 weeks.  It is worse when she tries to eat or drink.  The patient states that she has not noticed any blood in her vomit.  She has had some right sided pain with this.  The most worse in the right lower quadrant.  She had been taking home pregnancy test and did have some positives over the past couple of weeks.  She denies any abnormal vaginal discharge or bleeding.  She denies any fevers.   Per medical record review patient has a history of DM, miscarriage  Past Medical History:  Diagnosis Date  . Auditory hallucinations   . Diabetes mellitus without complication (HCC)    diet controlled  . Diabetes mellitus, type II ([place])   . Essential hypertension   . Essential hypertension 11/24/2017  . MDD (major depressive disorder)   . Miscarriage   . PTSD (post-traumatic stress disorder)     Patient Active Problem List   Diagnosis Date Noted  . Recurrent sinusitis 05/20/2018  . History of placement of ear tubes 05/20/2018  . Fever and chills 05/08/2018  . Tachycardia 05/04/2018  . Missed period 04/29/2018  . Asthma 04/10/2018  . RLQ abdominal pain 02/13/2018  . BV (bacterial vaginosis) 02/13/2018  . Type 2 diabetes mellitus ([place]) 11/24/2017  . Essential hypertension 11/24/2017  . Hyperlipidemia 11/24/2017  . Major depressive disorder, recurrent, severe without psychotic features ([place])   . PTSD (post-traumatic stress disorder) 04/11/2015    Past Surgical History:  Procedure  Laterality Date  . ADENOIDECTOMY    . CHOLECYSTECTOMY  2009  . OVARY SURGERY    . TONSILLECTOMY    . tubes in ear      Prior to Admission medications   Medication Sig Start Date End Date Taking? Authorizing Provider  ARIPiprazole (ABILIFY) 5 MG tablet Take 1.5 tablets (7.5 mg total) by mouth daily. 05/12/18   Ursula Alert, MD  atorvastatin (LIPITOR) 40 MG tablet Take 1 tablet by mouth at bedtime for cholesterol. 05/28/18   Pleas Koch, NP  blood glucose meter kit and supplies KIT Dispense based on patient and insurance preference. Use up three times daily as directed. (FOR ICD-9 250.00, 250.01). 11/24/17   Pleas Koch, NP  citalopram (CELEXA) 20 MG tablet Take 1 tablet (20 mg total) by mouth daily. 04/10/18 07/09/18  Pleas Koch, NP  doxepin (SINEQUAN) 10 MG capsule TAKE 1 TO 2 CAPSULES(10 TO 20 MG) BY MOUTH AT BEDTIME FOR SLEEP 06/22/18   Eappen, Ria Clock, MD  Dulaglutide (TRULICITY) 3.22 GU/5.4YH SOPN Inject 0.75 mg into the skin once weekly. 06/25/18   Pleas Koch, NP  empagliflozin (JARDIANCE) 25 MG TABS tablet Take 25 mg by mouth daily. 12/18/17   Pleas Koch, NP  fluticasone (FLONASE) 50 MCG/ACT nasal spray Place into the nose. 03/10/18   [provider]  hydrOXYzine (ATARAX/VISTARIL) 50 MG tablet  11/28/17   [provider]  Insulin Glargine (LANTUS) 100 UNIT/ML Solostar Pen Inject 30 units in the morning and 20 units in  the evening. 06/25/18   Pleas Koch, NP  Insulin Pen Needle (PEN NEEDLES) 31G X 6 MM MISC Use with insulin as directed. 11/25/17   Pleas Koch, NP  ipratropium-albuterol (DUONEB) 0.5-2.5 (3) MG/3ML SOLN Take 3 mLs by nebulization every 6 (six) hours as needed. 04/16/18   Laverle Hobby, MD  losartan (COZAAR) 100 MG tablet Take 1 tablet by mouth once daily for blood pressure. 12/18/17   Pleas Koch, NP  montelukast (SINGULAIR) 10 MG tablet Take 1 tablet (10 mg total) by mouth at bedtime. 04/16/18    Laverle Hobby, MD  ondansetron (ZOFRAN) 4 MG tablet Take 1 tablet (4 mg total) by mouth every 8 (eight) hours as needed. 06/23/18   Jearld Fenton, NP  Spacer/Aero-Holding Chambers (AEROCHAMBER PLUS) inhaler Use as instructed. Generic spacer if possible 03/30/18   Copland, Frederico Hamman, MD  VENTOLIN HFA 108 (90 Base) MCG/ACT inhaler INHALE 2 PUFFS BY MOUTH EVERY 6 HOURS AS NEEDED FOR SHORTNESS OF BREATH AND WHEEZING 04/23/18   Pleas Koch, NP  WIXELA INHUB 250-50 MCG/DOSE AEPB INHALE 1 PUFF INTO THE LUNGS TWICE DAILY. RINSE MOUTH AFTER USE 05/22/18   Laverle Hobby, MD    Allergies Ciprofloxacin; Ibuprofen; Metformin and related; Penicillins; and Sulfa antibiotics  Family History  Problem Relation Age of Onset  . Asthma Mother   . Diabetes Mother   . Hyperlipidemia Mother   . Hypertension Mother   . Diabetes Father   . Hyperlipidemia Father   . Hypertension Father   . Diabetes Brother   . Depression Brother   . Alcohol abuse Brother   . Depression Maternal Grandmother   . Stroke Paternal Grandmother     Social History Social History   Tobacco Use  . Smoking status: Never Smoker  . Smokeless tobacco: Never Used  Substance Use Topics  . Alcohol use: No  . Drug use: No    Review of Systems Constitutional: No fever/chills Eyes: No visual changes. ENT: No sore throat. Cardiovascular: Denies chest pain. Respiratory: Denies shortness of breath. Gastrointestinal: Positive for abdominal pain, nausea and vomiting.  Genitourinary: Negative for dysuria. Musculoskeletal: Negative for back pain. Skin: Negative for rash. Neurological: Negative for headaches, focal weakness or numbness.  ____________________________________________   PHYSICAL EXAM:  VITAL SIGNS: ED Triage Vitals  Enc Vitals Group     BP 07/05/18 1644 128/78     Pulse Rate 07/05/18 1644 (!) 102     Resp 07/05/18 1644 18     Temp 07/05/18 1644 98 F (36.7 C)     Temp Source 07/05/18 1644  Oral     SpO2 07/05/18 1644 98 %     Weight 07/05/18 1646 282 lb 3 oz (128 kg)     Height 07/05/18 1646 '5\' 6"'  (1.676 m)     Head Circumference --      Peak Flow --      Pain Score 07/05/18 1646 5   Constitutional: Alert and oriented.  Eyes: Conjunctivae are normal.  ENT      Head: Normocephalic and atraumatic.      Nose: No congestion/rhinnorhea.      Mouth/Throat: Mucous membranes are moist.      Neck: No stridor. Hematological/Lymphatic/Immunilogical: No cervical lymphadenopathy. Cardiovascular: Normal rate, regular rhythm.  No murmurs, rubs, or gallops.  Respiratory: Normal respiratory effort without tachypnea nor retractions. Breath sounds are clear and equal bilaterally. No wheezes/rales/rhonchi. Gastrointestinal: Soft and tender to the right lower quadrant. No rebound. No guarding.  Genitourinary: Deferred Musculoskeletal:  Normal range of motion in all extremities. No lower extremity edema. Neurologic:  Normal speech and language. No gross focal neurologic deficits are appreciated.  Skin:  Skin is warm, dry and intact. No rash noted. Psychiatric: Mood and affect are normal. Speech and behavior are normal. Patient exhibits appropriate insight and judgment.  ____________________________________________    LABS (pertinent positives/negatives)  Lipase 24 CMP wnl except glu 163 CBC wbc 7.1, hgb 14.3, plt 318  ____________________________________________   EKG  None  ____________________________________________    RADIOLOGY  CT abd/pel No acute findings  US pelvis No acute findings  ____________________________________________   PROCEDURES  Procedures  ____________________________________________   INITIAL IMPRESSION / ASSESSMENT AND PLAN / ED COURSE  Pertinent labs & imaging results that were available during my care of the patient were reviewed by me and considered in my medical decision making (see chart for details).   Patient presented to the  emergency department because of concern for nausea and vomiting as well as RLQ pain. On exam patient with some tenderness in the right lower quadrant. Given lack of fever or leukocytosis [place] was obtained first to evaluate for ovarian pathology. This could not visualize the ovaries so CT scan was obtained. This did not show any acute findings. Discussed work up with patient. Given lack of concerning findings on imaging do think it is reasonable to discharge at this time.   ____________________________________________   FINAL CLINICAL IMPRESSION(S) / ED DIAGNOSES  Final diagnoses:  Right adnexal tenderness  Abdominal pain, unspecified abdominal location  Nausea and vomiting, intractability of vomiting not specified, unspecified vomiting type     Note: This dictation was prepared with Dragon dictation. Any transcriptional errors that result from this process are unintentional     Nance Pear, MD 07/05/18 2350

## 2018-07-05 NOTE — ED Notes (Signed)
[facility]

## 2018-07-05 NOTE — ED Notes (Signed)
Patient off unit to u/s  

## 2018-07-05 NOTE — ED Notes (Signed)
Peripheral IV discontinued. Catheter intact. No signs of infiltration or redness. Gauze applied to IV site.   Discharge instructions reviewed with patient. Questions fielded by this RN. Patient verbalizes understanding of instructions. Patient discharged home in stable condition per EDP. No acute distress noted at time of discharge.    

## 2018-07-06 ENCOUNTER — Telehealth: Payer: Self-pay | Admitting: Primary Care

## 2018-07-06 ENCOUNTER — Encounter: Payer: Self-pay | Admitting: *Deleted

## 2018-07-06 NOTE — Telephone Encounter (Signed)
Completed and placed in Chans inbox. 

## 2018-07-06 NOTE — Telephone Encounter (Signed)
Place form/paperwork in [name] inbox for review and complete if necessary.   Also already printed a letter for patient regarding TB skin test.

## 2018-07-06 NOTE — Telephone Encounter (Signed)
Patient came into the office today to drop off a form to be completed for her job. Placed in RX tower. Please call when ready for pick up. Thank you

## 2018-07-07 ENCOUNTER — Ambulatory Visit: Payer: BLUE CROSS/BLUE SHIELD | Admitting: Psychiatry

## 2018-07-08 NOTE — Telephone Encounter (Signed)
Per DPR, left detail message of Graylon GunningKate Clark's comments for patient. Form is ready for pick up. Left in the front office.

## 2018-07-09 DIAGNOSIS — E1165 Type 2 diabetes mellitus with hyperglycemia: Secondary | ICD-10-CM | POA: Diagnosis not present

## 2018-07-09 DIAGNOSIS — N921 Excessive and frequent menstruation with irregular cycle: Secondary | ICD-10-CM | POA: Diagnosis not present

## 2018-07-09 DIAGNOSIS — N939 Abnormal uterine and vaginal bleeding, unspecified: Secondary | ICD-10-CM | POA: Diagnosis not present

## 2018-07-09 DIAGNOSIS — B379 Candidiasis, unspecified: Secondary | ICD-10-CM | POA: Diagnosis not present

## 2018-07-10 ENCOUNTER — Other Ambulatory Visit: Payer: Self-pay | Admitting: Psychiatry

## 2018-07-10 DIAGNOSIS — F3161 Bipolar disorder, current episode mixed, mild: Secondary | ICD-10-CM

## 2018-07-14 ENCOUNTER — Ambulatory Visit (INDEPENDENT_AMBULATORY_CARE_PROVIDER_SITE_OTHER): Payer: BLUE CROSS/BLUE SHIELD | Admitting: Licensed Clinical Social Worker

## 2018-07-14 ENCOUNTER — Encounter: Payer: Self-pay | Admitting: Licensed Clinical Social Worker

## 2018-07-14 DIAGNOSIS — F4310 Post-traumatic stress disorder, unspecified: Secondary | ICD-10-CM | POA: Diagnosis not present

## 2018-07-14 DIAGNOSIS — F4010 Social phobia, unspecified: Secondary | ICD-10-CM | POA: Diagnosis not present

## 2018-07-14 DIAGNOSIS — F3161 Bipolar disorder, current episode mixed, mild: Secondary | ICD-10-CM | POA: Diagnosis not present
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
## 2018-07-16 DIAGNOSIS — N882 Stricture and stenosis of cervix uteri: Secondary | ICD-10-CM | POA: Diagnosis not present

## 2018-07-16 DIAGNOSIS — Z01419 Encounter for gynecological examination (general) (routine) without abnormal findings: Secondary | ICD-10-CM | POA: Diagnosis not present

## 2018-07-16 DIAGNOSIS — R87612 Low grade squamous intraepithelial lesion on cytologic smear of cervix (LGSIL): Secondary | ICD-10-CM | POA: Diagnosis not present

## 2018-07-16 DIAGNOSIS — N859 Noninflammatory disorder of uterus, unspecified: Secondary | ICD-10-CM | POA: Diagnosis not present

## 2018-07-16 DIAGNOSIS — N939 Abnormal uterine and vaginal bleeding, unspecified: Secondary | ICD-10-CM | POA: Diagnosis not present

## 2018-07-16 NOTE — H&P (Signed)
Patient ID: Joan [name] is a 37 y.o. female presenting with Pre Op Consulting  on 07/16/2018  HPI: Pt with oligomenorrhea and menorrhagia, hx of PCOS. Body mass index is 45.68 kg/m. Secondary amenorrhea recently with negative workup, leading to PCOS dx. Hx of endometrial hyperplasia in 2010, treated with OCPs and with resolution on repeat biopsy,  Per patient report.  Pt with vaginal yeast infection, sx.  She is bleeding heavily and irregularly, and wishes to discuss definitive surgery.  Workup:  Pap: pending EMBx: pending  TVUS:   07/09/18 Ut 8x5x4 cmES 8mm   Hx of abdominal obesity, Body mass index is 45.68 kg/m.  Hx of lap chole, lap right ovarian cystectomy  Hx of diabetes, last A1C 10 1 month ago. Recent improvement with sugars on new medication. Hx of HTN, controlled with antihypertensives  Past Medical History:  has a past medical history of Diabetes mellitus type 2, uncomplicated (CMS-HCC) and Hypertension.  Past Surgical History:  has a past surgical history that includes Tonsillectomy & Adenoidectomy and removal ovarian cyst (Right). Family History: family history includes Diabetes in her father and mother; High blood pressure (Hypertension) in her father and mother. Social History:  reports that she has never smoked. She has never used smokeless tobacco. She reports that she does not drink alcohol or use drugs. OB/GYN History:          OB History    Gravida  10   Para      Term      Preterm      AB  10   Living        SAB  9   TAB      Ectopic      Molar      Multiple      Live Births             Allergies: is allergic to ciprofloxacin; metformin; ibuprofen; penicillin; penicillins; and sulfa (sulfonamide antibiotics). Medications:  Current Outpatient Medications:  .  ADVAIR DISKUS 250-50 mcg/dose diskus inhaler, INL 1 PUFF ITL TWICE DAILY. RM AFTER U, Disp: , Rfl: 2 .  albuterol (PROVENTIL HFA) 90 mcg/actuation  inhaler, Inhale 2 inhalations into the lungs every 6 (six) hours, Disp: , Rfl:  .  albuterol 90 mcg/actuation inhaler, Inhale 2 inhalations into the lungs every 6 (six) hours as needed for Wheezing or Shortness of Breath, Disp: 1 Inhaler, Rfl: 0 .  ARIPiprazole (ABILIFY) 10 MG tablet, , Disp: , Rfl:  .  atorvastatin (LIPITOR) 40 MG tablet, Take 40 mg by mouth nightly, Disp: , Rfl:  .  azelastine (ASTELIN) 137 mcg nasal spray, Place 1 spray into both nostrils 2 (two) times daily, Disp: 10 mL, Rfl: 1 .  BD ULTRA-FINE MINI PEN NEEDLE 31 gauge x 3/16" needle, U UTD WITH INSULIN, Disp: , Rfl: 2 .  chlorhexidine (PERIDEX) 0.12 % solution, RINSE WITH 15 ML PO BID FOR 2 WEEKS, Disp: , Rfl: 1 .  citalopram (CELEXA) 10 MG tablet, Take 10 mg by mouth once daily, Disp: , Rfl:  .  doxepin (SINEQUAN) 10 MG capsule, TAKE 1 TO 2 CAPSULES(10 TO 20 MG) BY MOUTH AT BEDTIME FOR SLEEP, Disp: , Rfl:  .  empagliflozin (JARDIANCE) 25 mg Tab tablet, Take by mouth, Disp: , Rfl:  .  EPINEPHrine (EPIPEN) 0.3 mg/0.3 mL pen injector, INJECT INTRAMUSCULARLY AS DIRECTED, Disp: , Rfl: 0 .  fluticasone propionate (FLONASE) 50 mcg/actuation nasal spray, Place 2 sprays into both nostrils once daily, Disp: 16  g, Rfl: 1 .  hydrOXYzine (ATARAX) 50 MG tablet, Take 50 mg by mouth nightly, Disp: , Rfl:  .  inhalational spacer (AEROCHAMBER WITH FLOWSIGNAL) spacer, Use as instructed. Generic spacer if possible, Disp: , Rfl:  .  insulin GLARGINE (LANTUS SOLOSTAR U-100 INSULIN) pen injector (concentration 100 units/mL), Inject 14 Units subcutaneously once daily, Disp: , Rfl:  .  ipratropium-albuterol (DUO-NEB) nebulizer solution, Inhale into the lungs, Disp: , Rfl:  .  ketoconazole (NIZORAL) 200 mg tablet, Take 1 tablet (200 mg total) by mouth once daily, Disp: 30 tablet, Rfl: 0 .  loperamide (IMODIUM A-D) 2 mg tablet, Take 2 capsules after first loose stool, then one capsule after each following loose stool. Do not exceed 16 mg per day., Disp:  20 tablet, Rfl: 0 .  loratadine (CLARITIN) 10 mg tablet, Take 10 mg by mouth once daily, Disp: , Rfl:  .  losartan (COZAAR) 50 MG tablet, Take 50 mg by mouth once daily, Disp: , Rfl:  .  medroxyPROGESTERone (PROVERA) 10 MG tablet, Take 1 tablet (10 mg total) by mouth once daily, Disp: 10 tablet, Rfl: 0 .  miscellaneous medical supply Misc, Dispense based on patient and insurance preference. Use up three times daily as directed. (FOR ICD-9 250.00, 250.01)., Disp: , Rfl:  .  montelukast (SINGULAIR) 10 mg tablet, , Disp: , Rfl: 3 .  nystatin (MYCOSTATIN) 100,000 unit/gram cream, Apply topically 2 (two) times daily, Disp: 30 g, Rfl: 0 .  ondansetron (ZOFRAN) 4 MG tablet, , Disp: , Rfl: 0 .  prenatal vit-iron fum-folic ac (PRENAVITE) tablet, Take 1 tablet by mouth once daily, Disp: , Rfl:  .  traMADol (ULTRAM) 50 mg tablet, , Disp: , Rfl: 0 .  TRULICITY 0.75 mg/0.5 mL PnIj, INJ 0.75MG  [place] ONCE WEEKLY, Disp: , Rfl: 1   Review of Systems: No SOB, no palpitations or chest pain, no new lower extremity edema, no nausea or vomiting or bowel or bladder complaints. See HPI for gyn specific ROS.   Exam:   BP 107/82   Pulse 105   Ht 167.6 cm (5\' 6" )   Wt (!) 128.4 kg (283 lb)   BMI 45.68 kg/m   General: Patient is well-groomed, well-nourished, appears stated age in no acute distress  HEENT: head is atraumatic and normocephalic, trachea is midline, neck is supple with no palpable nodules  CV: Regular rhythm and normal heart rate, no murmur  Pulm: Clear to auscultation throughout lung fields with no wheezing, crackles, or rhonchi. No increased work of breathing  Abdomen: soft , no mass, non-tender, no rebound tenderness, no hepatomegaly  Pelvic: tanner stage 5 ,              External genitalia: vulva /labia no lesions             Urethra: no prolapse             Vagina: heavy vaginal , no laxity in vaginal walls             Cervix: no lesions, no cervical motion tenderness, good  descent             Uterus: normal size shape and contour, non-tender             Adnexa: no mass,  non-tender               Rectovaginal: External wnl  Endometrial biopsy: The cervix was cleaned with betadine, topical Hurriciane spray applied, and a single tooth tenaculum is applied  to the anterior cervix. The Pipelle catheter was placed into the endometrial cavity. It sounds to 7 cm and adequate tissue was removed.   Pap collected   Impression:   The primary encounter diagnosis was Encounter for routine gynecological examination with Papanicolaou smear of cervix. A diagnosis of Abnormal uterine bleeding (AUB), unspecified was also pertinent to this visit.    Plan:    Patient returns for a discussion regarding the  possibility of surgical treatment of her heavy bleeding and hx of endometrial hyperplasia by total laparoscopic hysterectomy with bilateral salpingectomy  procedure. We will perform a cystoscopy to evaluate the urinary tract after the procedure if needed, and have discussed resulting sterilization.  Considered robotic hyst but do not think it will be necessary.  F/u pap and embx today  The patient and I discussed the technical aspects of the procedure including the potential for risks and complications. These include but are not limited to the risk of infection requiring post-operative antibiotics or further procedures. We talked about the risk of injury to adjacent organs including bladder, bowel, ureter, blood vessels or nerves. We talked about the need to convert to an open incision. We talked about the possible need for blood transfusion. We talked aboutpostop complications such asthromboembolic or cardiopulmonary complications. All of her questions were answered.  Her preoperative exam was completed and the appropriate consents were signed. She is scheduled to undergo this procedure in the near future.  She is aware that she will need to keep her  blood sugars <200, and will be on her full dose of Trulicity by that time. She checks her sugars 4x daily and thinks with diet changes she can temporarily keep her sugars <200 during the postop period.  Specific Peri-operative Considerations:  - Consent: obtained today - Health Maintenance: will need collection - Labs: CBC, CMP preoperatively - Studies: EKG, CXR preoperatively - Bowel Preparation: None required - Abx:  Gent/clinda - VTE ppx: SCDs perioperatively sd

## 2018-07-19 DIAGNOSIS — E119 Type 2 diabetes mellitus without complications: Secondary | ICD-10-CM | POA: Diagnosis not present

## 2018-07-19 DIAGNOSIS — J00 Acute nasopharyngitis [common cold]: Secondary | ICD-10-CM | POA: Diagnosis not present

## 2018-07-20 ENCOUNTER — Encounter: Payer: Self-pay | Admitting: Family Medicine

## 2018-07-20 ENCOUNTER — Ambulatory Visit (INDEPENDENT_AMBULATORY_CARE_PROVIDER_SITE_OTHER): Payer: BLUE CROSS/BLUE SHIELD | Admitting: Family Medicine

## 2018-07-20 DIAGNOSIS — J069 Acute upper respiratory infection, unspecified: Secondary | ICD-10-CM

## 2018-07-20 MED ORDER — DOXYCYCLINE HYCLATE 100 MG PO TABS: 100.0000 mg | ORAL_TABLET | Freq: Two times a day (BID) | ORAL | 0 refills | Status: DC

## 2018-07-20 MED ORDER — HYDROCODONE-HOMATROPINE 5-1.5 MG/5ML PO SYRP: 5.0000 mL | ORAL_SOLUTION | Freq: Three times a day (TID) | ORAL | 0 refills | Status: DC | PRN

## 2018-07-20 NOTE — Patient Instructions (Signed)
Possible non-flu virus.  Rest and fluids.  Use your inhalers with the hycodan if needed- sedation caution.  If not better at the end of the week, then start doxy.   Update us as needed.  Out of work for now.   Hold trulicity and update Clark in about 1 week about your situation and sugar.   Take care.  Glad to see you.

## 2018-07-20 NOTE — Progress Notes (Signed)
duration of symptoms: 3-4 days Started with vomiting, then sx got worse in the meantime.  Quick onset.  Some of that may have been related to trulicity but she felt worse in the meantime.   Then cough, with sputum, yellow rhinorrhea.  Chest is tight.  Ear pressure.  Diffuse aches.  Chills.  Maxillary pain noted, B.  No diarrhea.   t max 99.  Works at a daycare.    [facility] per patient flu was neg.    Sugar usually 130-230.  That is better, was prev sig higher.    Per HPI unless specifically indicated in ROS section  Meds, vitals, and allergies reviewed.   GEN: nad, alert and oriented HEENT: mucous membranes moist, TM w/o erythema, nasal epithelium injected, OP with cobblestoning, B max sinuses ttp  NECK: supple w/o LA CV: rrr. PULM: ctab, no inc wob ABD: soft, +bs EXT: no edema Hoarse voice, no stridor.

## 2018-07-21 ENCOUNTER — Other Ambulatory Visit: Payer: Self-pay | Admitting: Psychiatry

## 2018-07-21 DIAGNOSIS — F3161 Bipolar disorder, current episode mixed, mild: Secondary | ICD-10-CM

## 2018-07-21 DIAGNOSIS — F4310 Post-traumatic stress disorder, unspecified: Secondary | ICD-10-CM

## 2018-07-21 NOTE — Assessment & Plan Note (Signed)
Possible non-flu virus.  Rest and fluids.  Use baseline inhalers with the hycodan if needed- sedation caution.  If not better at the end of the week, then start doxy.   Update us as needed.  Out of work for now.   Hold trulicity and update PCP in about 1 week about her situation and sugar.

## 2018-07-22 ENCOUNTER — Ambulatory Visit (INDEPENDENT_AMBULATORY_CARE_PROVIDER_SITE_OTHER): Payer: BLUE CROSS/BLUE SHIELD | Admitting: Primary Care

## 2018-07-22 ENCOUNTER — Encounter: Payer: Self-pay | Admitting: *Deleted

## 2018-07-22 VITALS — BP 130/88 | HR 102 | Temp 98.3°F | Ht 66.0 in | Wt 286.0 lb

## 2018-07-22 DIAGNOSIS — R112 Nausea with vomiting, unspecified: Secondary | ICD-10-CM | POA: Diagnosis not present

## 2018-07-22 MED ORDER — ONDANSETRON 4 MG PO TBDP: 4.0000 mg | ORAL_TABLET | Freq: Three times a day (TID) | ORAL | 0 refills | Status: DC | PRN
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
## 2018-07-22 NOTE — Patient Instructions (Signed)
You can take the ondansetron anti-nausea medication every 8 hours as needed for nausea/vomiting.  You must work on hydration with water and low calorie gatorade.   Please call me if you start running fevers of 101 or higher.  Schedule a follow up visit with me on or after January 24th for diabetes check.  It was a pleasure to see you today!

## 2018-07-23 ENCOUNTER — Ambulatory Visit: Payer: Self-pay | Admitting: Primary Care

## 2018-07-23 LAB — CBC WITH DIFFERENTIAL/PLATELET
Basophils Absolute: 0.1 10*3/uL (ref 0.0–0.1)
Basophils Relative: 1.3 % (ref 0.0–3.0)
Eosinophils Absolute: 0.3 10*3/uL (ref 0.0–0.7)
Eosinophils Relative: 3.4 % (ref 0.0–5.0)
HCT: 44.3 % (ref 36.0–46.0)
Hemoglobin: 14.6 g/dL (ref 12.0–15.0)
Lymphocytes Relative: 21.7 % (ref 12.0–46.0)
Lymphs Abs: 1.7 10*3/uL (ref 0.7–4.0)
MCHC: 32.9 g/dL (ref 30.0–36.0)
MCV: 85.1 fl (ref 78.0–100.0)
Monocytes Absolute: 0.5 10*3/uL (ref 0.1–1.0)
Monocytes Relative: 6.2 % (ref 3.0–12.0)
Neutro Abs: 5.3 10*3/uL (ref 1.4–7.7)
Neutrophils Relative %: 67.4 % (ref 43.0–77.0)
Platelets: 336 10*3/uL (ref 150.0–400.0)
RBC: 5.2 Mil/uL — ABNORMAL HIGH (ref 3.87–5.11)
RDW: 13.3 % (ref 11.5–15.5)
WBC: 7.9 10*3/uL (ref 4.0–10.5)

## 2018-07-23 LAB — COMPREHENSIVE METABOLIC PANEL
ALT: 15 U/L (ref 0–35)
AST: 13 U/L (ref 0–37)
Albumin: 4.1 g/dL (ref 3.5–5.2)
Alkaline Phosphatase: 106 U/L (ref 39–117)
BUN: 16 mg/dL (ref 6–23)
CO2: 26 mEq/L (ref 19–32)
Calcium: 9.3 mg/dL (ref 8.4–10.5)
Chloride: 99 mEq/L (ref 96–112)
Creatinine, Ser: 0.84 mg/dL (ref 0.40–1.20)
GFR: 80.27 mL/min (ref >60.00)
Glucose, Bld: 180 mg/dL — ABNORMAL HIGH (ref 70–99)
Potassium: 4.5 mEq/L (ref 3.5–5.1)
Sodium: 134 mEq/L — ABNORMAL LOW (ref 135–145)
Total Bilirubin: 0.3 mg/dL (ref 0.2–1.2)
Total Protein: 7.3 g/dL (ref 6.0–8.3)

## 2018-07-27 ENCOUNTER — Other Ambulatory Visit: Payer: Self-pay

## 2018-07-27 ENCOUNTER — Encounter
Admission: RE | Admit: 2018-07-27 | Discharge: 2018-07-27 | Disposition: A | Discharge status: Home / Self Care | Payer: BLUE CROSS/BLUE SHIELD | Source: Ambulatory Visit | Attending: Obstetrics and Gynecology | Admitting: Obstetrics and Gynecology

## 2018-07-27 ENCOUNTER — Ambulatory Visit: Payer: BLUE CROSS/BLUE SHIELD | Admitting: Primary Care

## 2018-07-27 DIAGNOSIS — N72 Inflammatory disease of cervix uteri: Secondary | ICD-10-CM | POA: Diagnosis not present

## 2018-07-27 DIAGNOSIS — Z01812 Encounter for preprocedural laboratory examination: Secondary | ICD-10-CM | POA: Insufficient documentation

## 2018-07-27 DIAGNOSIS — B977 Papillomavirus as the cause of diseases classified elsewhere: Secondary | ICD-10-CM | POA: Diagnosis not present

## 2018-07-27 DIAGNOSIS — R87612 Low grade squamous intraepithelial lesion on cytologic smear of cervix (LGSIL): Secondary | ICD-10-CM | POA: Diagnosis not present

## 2018-07-27 HISTORY — DX: Gastro-esophageal reflux disease without esophagitis: K21.9

## 2018-07-27 HISTORY — DX: Unspecified asthma, uncomplicated: J45.909

## 2018-07-27 HISTORY — DX: Dyspnea, unspecified: R06.00

## 2018-07-27 LAB — CBC
HCT: 42.8 % (ref 36.0–46.0)
Hemoglobin: 14.2 g/dL (ref 12.0–15.0)
MCH: 27.5 pg (ref 26.0–34.0)
MCHC: 33.2 g/dL (ref 30.0–36.0)
MCV: 82.9 fL (ref 80.0–100.0)
Platelets: 364 10*3/uL (ref 150–400)
RBC: 5.16 MIL/uL — ABNORMAL HIGH (ref 3.87–5.11)
RDW: 12.8 % (ref 11.5–15.5)
WBC: 8.4 10*3/uL (ref 4.0–10.5)
nRBC: 0 % (ref 0.0–0.2)

## 2018-07-27 LAB — BASIC METABOLIC PANEL
Anion gap: 8 (ref 5–15)
BUN: 12 mg/dL (ref 6–20)
CO2: 23 mmol/L (ref 22–32)
Calcium: 8.5 mg/dL — ABNORMAL LOW (ref 8.9–10.3)
Chloride: 103 mmol/L (ref 98–111)
Creatinine, Ser: 0.56 mg/dL (ref 0.44–1.00)
GFR calc Af Amer: >60 mL/min (ref >60)
GFR calc non Af Amer: >60 mL/min (ref >60)
Glucose, Bld: 148 mg/dL — ABNORMAL HIGH (ref 70–99)
Potassium: 3.6 mmol/L (ref 3.5–5.1)
Sodium: 134 mmol/L — ABNORMAL LOW (ref 135–145)

## 2018-07-27 LAB — TYPE AND SCREEN
ABO/RH(D): A POS
Antibody Screen: NEGATIVE

## 2018-07-27 NOTE — Patient Instructions (Signed)
Your procedure is scheduled on: Friday, July 31, 2018  Report to THE SECOND FLOOR OF THE MEDICAL MALL     DO NOT STOP ON THE FIRST FLOOR TO REGISTER  To find out your arrival time please call [phone] between 1PM - 3PM on Thursday, December 26,2019  Remember: Instructions that are not followed completely may result in serious medical risk,  up to and including death, or upon the discretion of your surgeon and anesthesiologist your  surgery may need to be rescheduled.     _X__ 1. Do not eat food after midnight the night before your procedure.                 No gum chewing or hard candies.                     ABSOLUTELY NOTHING SOLID IN YOUR MOUTH AFTER MIDNIGHT                 You may drink clear liquids up to 2 hours before you are scheduled to arrive for your surgery-                   DO not drink clear liquids within 2 hours of the start of your surgery.                  Clear Liquids include:  water, apple juice without pulp, clear carbohydrate                 drink such as Clearfast of Gatorade, Black Coffee or Tea (Do not add                 anything to coffee or tea). BEING DIABETIC, WATER IS PREFERABLE.  __X__2.  On the morning of surgery brush your teeth with toothpaste and water,                   You may rinse your mouth with mouthwash if you wish.                      Do not swallow any toothpaste of mouthwash.     _X__ 3.  No Alcohol for 24 hours before or after surgery.   _X__ 4.  Do Not Smoke or use e-cigarettes For 24 Hours Prior to Your Surgery.                 Do not use any chewable tobacco products for at least 6 hours prior to                 surgery.  ____  5.  Bring all medications with you on the day of surgery if instructed.   ____  6.  Notify your doctor if there is any change in your medical condition      (cold, fever, infections).     Do not wear jewelry, make-up, hairpins, clips or nail polish. Do not wear lotions,  powders, or perfumes. You may wear deodorant. Do not shave 48 hours prior to surgery. Men may shave face and neck. Do not bring valuables to the hospital.    [facility] is not responsible for any belongings or valuables.  Contacts, dentures or bridgework may not be worn into surgery. Leave your suitcase in the car. After surgery it may be brought to your room. For patients admitted to the hospital, discharge time is determined by your treatment team.  Patients discharged the day of surgery will not be allowed to drive home.   Please read over the following fact sheets that you were given:   PREPARING FOR SURGERY                INCENTIVE SPIROMETRY  __X__ Take these medicines the morning of surgery with A SIP OF WATER:    1. ADVAIR, PLEASE BRING WITH YOU TO THE HOSPITAL  2. TAKE SOMETHING FOR ANXIETY BEFORE SURGERY  3.  FLONASE, IF NEEDED  4.  5.  6.  ____ Fleet Enema (as directed)   __X__ Use CHG Soap as directed  __X__ Use inhalers on the day of surgery  ____ Stop metformin 2 days prior to surgery    _X___ Take 1/2 of usual insulin dose the night before surgery. TAKE 10 UNITS OF LANTUS             No insulin the morning of surgery.   _X___ Stop ALL ASPIRIN PRODUCTS TODAY  _X___ Stop Anti-inflammatories AS OF TODAY   ____ Stop supplements until after surgery.    ____ Bring C-Pap to the hospital.   CONTINUE TAKING ALL YOUR NIGHTTIME PILLS AS USUAL.  CONTINUE TAKING LOSARTAN AND JARDIANCE BUT DO NOT TAKE EITHER ON THE       DAY OF SURGERY  WEAR LOOSE AND COMFORTABLE CLOTHING  HAVE STOOL SOFTENERS FOR USE AT HOME.  RECOMMEND STARTING THEM A DAY OR     TWO PRIOR TO SURGERY.  IF YOU COMPLETE THE MEDICAL DIRECTIVES, PLEASE BRING WITH  YOU TO THE HOSPITAL.

## 2018-07-27 NOTE — Pre-Procedure Instructions (Signed)
Provided patient with an incentive spirometer, written instructions and verbal instructions. She was able to perform a return demonstration on how to use the device. She was successful in this performance.

## 2018-07-30 MED ORDER — GENTAMICIN SULFATE 40 MG/ML IJ SOLN
640.0000 mg | INTRAVENOUS | Status: AC
  Administered 2018-07-31: 640 mg via INTRAVENOUS
  Filled 2018-07-30: qty 16

## 2018-07-30 MED ORDER — CLINDAMYCIN PHOSPHATE 900 MG/50ML IV SOLN
900.0000 mg | INTRAVENOUS | Status: AC
  Administered 2018-07-31: 900 mg via INTRAVENOUS

## 2018-07-30 MED ORDER — GENTAMICIN SULFATE 40 MG/ML IJ SOLN
INTRAVENOUS | Status: DC
  Filled 2018-07-30: qty 16

## 2018-07-31 ENCOUNTER — Encounter: Payer: Self-pay | Admitting: *Deleted

## 2018-07-31 ENCOUNTER — Ambulatory Visit: Payer: BLUE CROSS/BLUE SHIELD | Admitting: Certified Registered Nurse Anesthetist

## 2018-07-31 ENCOUNTER — Other Ambulatory Visit: Payer: Self-pay

## 2018-07-31 ENCOUNTER — Ambulatory Visit
Admission: RE | Admit: 2018-07-31 | Discharge: 2018-07-31 | Disposition: A | Discharge status: Home / Self Care | Payer: BLUE CROSS/BLUE SHIELD | Attending: Obstetrics and Gynecology | Admitting: Obstetrics and Gynecology

## 2018-07-31 ENCOUNTER — Encounter
Admission: RE | Disposition: A | Discharge status: Home / Self Care | Payer: Self-pay | Source: Home / Self Care | Attending: Obstetrics and Gynecology

## 2018-07-31 DIAGNOSIS — K219 Gastro-esophageal reflux disease without esophagitis: Secondary | ICD-10-CM | POA: Insufficient documentation

## 2018-07-31 DIAGNOSIS — Z6841 Body Mass Index (BMI) 40.0 and over, adult: Secondary | ICD-10-CM | POA: Insufficient documentation

## 2018-07-31 DIAGNOSIS — N879 Dysplasia of cervix uteri, unspecified: Secondary | ICD-10-CM | POA: Diagnosis not present

## 2018-07-31 DIAGNOSIS — J45909 Unspecified asthma, uncomplicated: Secondary | ICD-10-CM | POA: Diagnosis not present

## 2018-07-31 DIAGNOSIS — N939 Abnormal uterine and vaginal bleeding, unspecified: Secondary | ICD-10-CM | POA: Diagnosis not present

## 2018-07-31 DIAGNOSIS — N72 Inflammatory disease of cervix uteri: Secondary | ICD-10-CM | POA: Diagnosis not present

## 2018-07-31 DIAGNOSIS — Z79899 Other long term (current) drug therapy: Secondary | ICD-10-CM | POA: Insufficient documentation

## 2018-07-31 DIAGNOSIS — N920 Excessive and frequent menstruation with regular cycle: Secondary | ICD-10-CM | POA: Insufficient documentation

## 2018-07-31 DIAGNOSIS — E669 Obesity, unspecified: Secondary | ICD-10-CM | POA: Insufficient documentation

## 2018-07-31 DIAGNOSIS — N838 Other noninflammatory disorders of ovary, fallopian tube and broad ligament: Secondary | ICD-10-CM | POA: Diagnosis not present

## 2018-07-31 DIAGNOSIS — F419 Anxiety disorder, unspecified: Secondary | ICD-10-CM | POA: Insufficient documentation

## 2018-07-31 DIAGNOSIS — Z794 Long term (current) use of insulin: Secondary | ICD-10-CM | POA: Diagnosis not present

## 2018-07-31 DIAGNOSIS — Z951 Presence of aortocoronary bypass graft: Secondary | ICD-10-CM | POA: Insufficient documentation

## 2018-07-31 DIAGNOSIS — E119 Type 2 diabetes mellitus without complications: Secondary | ICD-10-CM | POA: Diagnosis not present

## 2018-07-31 DIAGNOSIS — I10 Essential (primary) hypertension: Secondary | ICD-10-CM | POA: Diagnosis not present

## 2018-07-31 DIAGNOSIS — F329 Major depressive disorder, single episode, unspecified: Secondary | ICD-10-CM | POA: Diagnosis not present

## 2018-07-31 DIAGNOSIS — N8301 Follicular cyst of right ovary: Secondary | ICD-10-CM | POA: Diagnosis not present

## 2018-07-31 DIAGNOSIS — N915 Oligomenorrhea, unspecified: Secondary | ICD-10-CM | POA: Diagnosis present

## 2018-07-31 DIAGNOSIS — N736 Female pelvic peritoneal adhesions (postinfective): Secondary | ICD-10-CM | POA: Insufficient documentation

## 2018-07-31 HISTORY — PX: LAPAROSCOPIC BILATERAL SALPINGECTOMY: SHX5889

## 2018-07-31 HISTORY — PX: CYSTOSCOPY: SHX5120

## 2018-07-31 HISTORY — PX: LYSIS OF ADHESION: SHX5961

## 2018-07-31 HISTORY — PX: LAPAROSCOPIC HYSTERECTOMY: SHX1926

## 2018-07-31 LAB — GLUCOSE, CAPILLARY
Glucose-Capillary: 117 mg/dL — ABNORMAL HIGH (ref 70–99)
Glucose-Capillary: 140 mg/dL — ABNORMAL HIGH (ref 70–99)
Glucose-Capillary: 225 mg/dL — ABNORMAL HIGH (ref 70–99)

## 2018-07-31 LAB — ABO/RH: ABO/RH(D): A POS

## 2018-07-31 LAB — POCT PREGNANCY, URINE: Preg Test, Ur: NEGATIVE

## 2018-07-31 SURGERY — HYSTERECTOMY, TOTAL, LAPAROSCOPIC
Anesthesia: General | Laterality: Right

## 2018-07-31 MED ORDER — ONDANSETRON HCL 4 MG/2ML IJ SOLN
INTRAMUSCULAR | Status: AC
  Filled 2018-07-31: qty 2

## 2018-07-31 MED ORDER — MIDAZOLAM HCL 2 MG/2ML IJ SOLN
INTRAMUSCULAR | Status: DC | PRN
  Administered 2018-07-31: 2 mg via INTRAVENOUS

## 2018-07-31 MED ORDER — BUPIVACAINE HCL (PF) 0.5 % IJ SOLN
INTRAMUSCULAR | Status: AC
  Filled 2018-07-31: qty 30

## 2018-07-31 MED ORDER — GABAPENTIN 300 MG PO CAPS
ORAL_CAPSULE | ORAL | Status: AC
  Administered 2018-07-31: 300 mg via ORAL
  Filled 2018-07-31: qty 1

## 2018-07-31 MED ORDER — FENTANYL CITRATE (PF) 100 MCG/2ML IJ SOLN
INTRAMUSCULAR | Status: DC | PRN
  Administered 2018-07-31: 25 ug via INTRAVENOUS
  Administered 2018-07-31 (×2): 50 ug via INTRAVENOUS
  Administered 2018-07-31: 25 ug via INTRAVENOUS
  Administered 2018-07-31: 50 ug via INTRAVENOUS

## 2018-07-31 MED ORDER — OXYCODONE HCL 5 MG PO CAPS: 5.0000 mg | ORAL_CAPSULE | Freq: Four times a day (QID) | ORAL | 0 refills | Status: DC | PRN

## 2018-07-31 MED ORDER — GABAPENTIN 300 MG PO CAPS
300.0000 mg | ORAL_CAPSULE | ORAL | Status: AC
  Administered 2018-07-31: 300 mg via ORAL

## 2018-07-31 MED ORDER — GABAPENTIN 800 MG PO TABS: 800.0000 mg | ORAL_TABLET | Freq: Every day | ORAL | 0 refills | Status: DC

## 2018-07-31 MED ORDER — MIDAZOLAM HCL 2 MG/2ML IJ SOLN
INTRAMUSCULAR | Status: AC
  Filled 2018-07-31: qty 2

## 2018-07-31 MED ORDER — LACTATED RINGERS IV SOLN
INTRAVENOUS | Status: DC | PRN
  Administered 2018-07-31: 16:00:00 via INTRAVENOUS

## 2018-07-31 MED ORDER — ROCURONIUM BROMIDE 50 MG/5ML IV SOLN
INTRAVENOUS | Status: AC
  Filled 2018-07-31: qty 2

## 2018-07-31 MED ORDER — LACTATED RINGERS IV SOLN: INTRAVENOUS | Status: DC | PRN

## 2018-07-31 MED ORDER — FENTANYL CITRATE (PF) 100 MCG/2ML IJ SOLN
INTRAMUSCULAR | Status: AC
  Filled 2018-07-31: qty 2

## 2018-07-31 MED ORDER — LIDOCAINE HCL (CARDIAC) PF 100 MG/5ML IV SOSY
PREFILLED_SYRINGE | INTRAVENOUS | Status: DC | PRN
  Administered 2018-07-31: 100 mg via INTRAVENOUS

## 2018-07-31 MED ORDER — PHENYLEPHRINE HCL 10 MG/ML IJ SOLN
INTRAMUSCULAR | Status: DC | PRN
  Administered 2018-07-31 (×2): 200 ug via INTRAVENOUS
  Administered 2018-07-31: 100 ug via INTRAVENOUS
  Administered 2018-07-31: 200 ug via INTRAVENOUS
  Administered 2018-07-31 (×2): 100 ug via INTRAVENOUS

## 2018-07-31 MED ORDER — ENSURE PRE-SURGERY PO LIQD
592.0000 mL | Freq: Once | ORAL | Status: DC
  Filled 2018-07-31: qty 592

## 2018-07-31 MED ORDER — ENSURE PRE-SURGERY PO LIQD
296.0000 mL | Freq: Once | ORAL | Status: DC
  Filled 2018-07-31: qty 296

## 2018-07-31 MED ORDER — DEXAMETHASONE SODIUM PHOSPHATE 10 MG/ML IJ SOLN
INTRAMUSCULAR | Status: DC | PRN
  Administered 2018-07-31: 4 mg via INTRAVENOUS

## 2018-07-31 MED ORDER — SUCCINYLCHOLINE CHLORIDE 20 MG/ML IJ SOLN
INTRAMUSCULAR | Status: AC
  Filled 2018-07-31: qty 1

## 2018-07-31 MED ORDER — ACETAMINOPHEN 500 MG PO TABS
ORAL_TABLET | ORAL | Status: AC
  Administered 2018-07-31: 1000 mg via ORAL
  Filled 2018-07-31: qty 2

## 2018-07-31 MED ORDER — SODIUM CHLORIDE 0.9 % IV SOLN
INTRAVENOUS | Status: DC | PRN
  Administered 2018-07-31: 50 ug/min via INTRAVENOUS

## 2018-07-31 MED ORDER — OXYCODONE HCL 5 MG PO TABS
5.0000 mg | ORAL_TABLET | Freq: Once | ORAL | Status: AC
  Administered 2018-07-31: 5 mg via ORAL

## 2018-07-31 MED ORDER — ROCURONIUM BROMIDE 50 MG/5ML IV SOLN
INTRAVENOUS | Status: AC
  Filled 2018-07-31: qty 1

## 2018-07-31 MED ORDER — FAMOTIDINE 20 MG PO TABS
ORAL_TABLET | ORAL | Status: AC
  Administered 2018-07-31: 20 mg via ORAL
  Filled 2018-07-31: qty 1

## 2018-07-31 MED ORDER — ONDANSETRON HCL 4 MG/2ML IJ SOLN
INTRAMUSCULAR | Status: DC | PRN
  Administered 2018-07-31: 4 mg via INTRAVENOUS

## 2018-07-31 MED ORDER — ESMOLOL HCL 100 MG/10ML IV SOLN
INTRAVENOUS | Status: AC
  Filled 2018-07-31: qty 10

## 2018-07-31 MED ORDER — ROCURONIUM BROMIDE 100 MG/10ML IV SOLN
INTRAVENOUS | Status: DC | PRN
  Administered 2018-07-31 (×2): 20 mg via INTRAVENOUS
  Administered 2018-07-31: 5 mg via INTRAVENOUS
  Administered 2018-07-31: 50 mg via INTRAVENOUS
  Administered 2018-07-31: 30 mg via INTRAVENOUS

## 2018-07-31 MED ORDER — ONDANSETRON HCL 4 MG/2ML IJ SOLN: 4.0000 mg | Freq: Once | INTRAMUSCULAR | Status: DC | PRN

## 2018-07-31 MED ORDER — SUGAMMADEX SODIUM 500 MG/5ML IV SOLN
INTRAVENOUS | Status: AC
  Filled 2018-07-31: qty 5

## 2018-07-31 MED ORDER — FENTANYL CITRATE (PF) 100 MCG/2ML IJ SOLN
25.0000 ug | INTRAMUSCULAR | Status: DC | PRN
  Administered 2018-07-31 (×4): 25 ug via INTRAVENOUS

## 2018-07-31 MED ORDER — ESMOLOL HCL 100 MG/10ML IV SOLN
INTRAVENOUS | Status: DC | PRN
  Administered 2018-07-31 (×2): 10 mg via INTRAVENOUS

## 2018-07-31 MED ORDER — SUGAMMADEX SODIUM 500 MG/5ML IV SOLN
INTRAVENOUS | Status: DC | PRN
  Administered 2018-07-31: 300 mg via INTRAVENOUS

## 2018-07-31 MED ORDER — FENTANYL CITRATE (PF) 100 MCG/2ML IJ SOLN
INTRAMUSCULAR | Status: AC
  Administered 2018-07-31: 25 ug via INTRAVENOUS
  Filled 2018-07-31: qty 2

## 2018-07-31 MED ORDER — LIDOCAINE HCL (PF) 2 % IJ SOLN
INTRAMUSCULAR | Status: AC
  Filled 2018-07-31: qty 10

## 2018-07-31 MED ORDER — SODIUM CHLORIDE 0.9 % IV SOLN
INTRAVENOUS | Status: DC
  Administered 2018-07-31 (×2): via INTRAVENOUS

## 2018-07-31 MED ORDER — DOCUSATE SODIUM 100 MG PO CAPS: 100.0000 mg | ORAL_CAPSULE | Freq: Two times a day (BID) | ORAL | 0 refills | Status: DC

## 2018-07-31 MED ORDER — ACETAMINOPHEN 500 MG PO TABS
1000.0000 mg | ORAL_TABLET | ORAL | Status: AC
  Administered 2018-07-31: 1000 mg via ORAL

## 2018-07-31 MED ORDER — SUCCINYLCHOLINE CHLORIDE 20 MG/ML IJ SOLN
INTRAMUSCULAR | Status: DC | PRN
  Administered 2018-07-31: 120 mg via INTRAVENOUS

## 2018-07-31 MED ORDER — ACETAMINOPHEN 500 MG PO TABS: 1000.0000 mg | ORAL_TABLET | Freq: Four times a day (QID) | ORAL | 0 refills | Status: AC

## 2018-07-31 MED ORDER — PROPOFOL 10 MG/ML IV BOLUS
INTRAVENOUS | Status: DC | PRN
  Administered 2018-07-31: 50 mg via INTRAVENOUS
  Administered 2018-07-31: 150 mg via INTRAVENOUS
  Administered 2018-07-31: 50 mg via INTRAVENOUS

## 2018-07-31 MED ORDER — DEXAMETHASONE SODIUM PHOSPHATE 10 MG/ML IJ SOLN
INTRAMUSCULAR | Status: AC
  Filled 2018-07-31: qty 1

## 2018-07-31 MED ORDER — PHENYLEPHRINE HCL 10 MG/ML IJ SOLN
INTRAMUSCULAR | Status: AC
  Filled 2018-07-31: qty 1

## 2018-07-31 MED ORDER — OXYCODONE HCL 5 MG PO TABS
ORAL_TABLET | ORAL | Status: AC
  Filled 2018-07-31: qty 1

## 2018-07-31 MED ORDER — CLINDAMYCIN PHOSPHATE 900 MG/50ML IV SOLN
INTRAVENOUS | Status: AC
  Filled 2018-07-31: qty 50

## 2018-07-31 MED ORDER — BUPIVACAINE HCL 0.5 % IJ SOLN
INTRAMUSCULAR | Status: DC | PRN
  Administered 2018-07-31: 13 mL

## 2018-07-31 MED ORDER — FAMOTIDINE 20 MG PO TABS
20.0000 mg | ORAL_TABLET | Freq: Once | ORAL | Status: AC
  Administered 2018-07-31: 20 mg via ORAL

## 2018-07-31 SURGICAL SUPPLY — 67 items
ADH SKN CLS APL DERMABOND .7 (GAUZE/BANDAGES/DRESSINGS) ×3
BAG SPEC RTRVL LRG 6X4 10 (ENDOMECHANICALS)
BAG URINE DRAINAGE (UROLOGICAL SUPPLIES) ×8 IMPLANT
BLADE SURG SZ11 CARB STEEL (BLADE) ×4 IMPLANT
CATH FOLEY 2WAY  5CC 16FR (CATHETERS) ×1
CATH FOLEY 2WAY 5CC 16FR (CATHETERS) ×3
CATH URTH 16FR FL 2W BLN LF (CATHETERS) ×3 IMPLANT
CHLORAPREP W/TINT 26ML (MISCELLANEOUS) ×4 IMPLANT
CORD MONOPOLAR M/FML 12FT (MISCELLANEOUS) ×4 IMPLANT
COUNTER NEEDLE 20/40 LG (NEEDLE) ×4 IMPLANT
COVER LIGHT HANDLE STERIS (MISCELLANEOUS) ×8 IMPLANT
COVER WAND RF STERILE (DRAPES) ×4 IMPLANT
DERMABOND ADVANCED (GAUZE/BANDAGES/DRESSINGS) ×1
DERMABOND ADVANCED .7 DNX12 (GAUZE/BANDAGES/DRESSINGS) ×3 IMPLANT
DEVICE SUTURE ENDOST 10MM (ENDOMECHANICALS) ×4 IMPLANT
DRAPE GENERAL ENDO 106X123.5 (DRAPES) ×4 IMPLANT
DRAPE LEGGINS SURG 28X43 STRL (DRAPES) ×3 IMPLANT
DRAPE STERI POUCH LG 24X46 STR (DRAPES) ×4 IMPLANT
DRSG TEGADERM 2-3/8X2-3/4 SM (GAUZE/BANDAGES/DRESSINGS) ×12 IMPLANT
GLOVE BIO SURGEON STRL SZ7 (GLOVE) ×15 IMPLANT
GLOVE INDICATOR 7.5 STRL GRN (GLOVE) ×10 IMPLANT
GOWN STRL REUS W/ TWL LRG LVL3 (GOWN DISPOSABLE) ×6 IMPLANT
GOWN STRL REUS W/ TWL XL LVL3 (GOWN DISPOSABLE) ×3 IMPLANT
GOWN STRL REUS W/TWL LRG LVL3 (GOWN DISPOSABLE) ×16
GOWN STRL REUS W/TWL XL LVL3 (GOWN DISPOSABLE) ×8
GRASPER SUT TROCAR 14GX15 (MISCELLANEOUS) ×4 IMPLANT
IRRIGATION STRYKERFLOW (MISCELLANEOUS) ×3 IMPLANT
IRRIGATOR STRYKERFLOW (MISCELLANEOUS) ×4
IV NS 1000ML (IV SOLUTION) ×8
IV NS 1000ML BAXH (IV SOLUTION) ×3 IMPLANT
KIT PINK PAD W/HEAD ARE REST (MISCELLANEOUS) ×4
KIT PINK PAD W/HEAD ARM REST (MISCELLANEOUS) ×3 IMPLANT
KIT TURNOVER CYSTO (KITS) ×4 IMPLANT
LABEL OR SOLS (LABEL) ×4 IMPLANT
LIGASURE VESSEL 5MM BLUNT TIP (ELECTROSURGICAL) ×1 IMPLANT
MANIPULATOR VCARE LG CRV RETR (MISCELLANEOUS) IMPLANT
MANIPULATOR VCARE SML CRV RETR (MISCELLANEOUS) IMPLANT
MANIPULATOR VCARE STD CRV RETR (MISCELLANEOUS) ×2 IMPLANT
NS IRRIG 500ML POUR BTL (IV SOLUTION) ×4 IMPLANT
OCCLUDER COLPOPNEUMO (BALLOONS) ×4 IMPLANT
PACK GYN LAPAROSCOPIC (MISCELLANEOUS) ×4 IMPLANT
PAD OB MATERNITY 4.3X12.25 (PERSONAL CARE ITEMS) ×4 IMPLANT
PAD PREP 24X41 OB/GYN DISP (PERSONAL CARE ITEMS) ×4 IMPLANT
POUCH SPECIMEN RETRIEVAL 10MM (ENDOMECHANICALS) IMPLANT
SCISSORS METZENBAUM CVD 33 (INSTRUMENTS) ×1 IMPLANT
SET CYSTO W/LG BORE CLAMP LF (SET/KITS/TRAYS/PACK) ×1 IMPLANT
SET TRI-LUMEN FLTR TB AIRSEAL (TUBING) ×2 IMPLANT
SLEEVE ENDOPATH XCEL 5M (ENDOMECHANICALS) ×4 IMPLANT
SPONGE GAUZE 2X2 8PLY STRL LF (GAUZE/BANDAGES/DRESSINGS) ×8 IMPLANT
STRIP CLOSURE SKIN 1/4X4 (GAUZE/BANDAGES/DRESSINGS) ×4 IMPLANT
SUT ENDO VLOC 180-0-8IN (SUTURE) ×4 IMPLANT
SUT MNCRL 4-0 (SUTURE) ×4
SUT MNCRL 4-0 27XMFL (SUTURE) ×3
SUT MNCRL AB 4-0 PS2 18 (SUTURE) ×4 IMPLANT
SUT VIC AB 0 CT1 36 (SUTURE) ×8 IMPLANT
SUT VIC AB 2-0 UR6 27 (SUTURE) ×4 IMPLANT
SUT VIC AB 4-0 SH 27 (SUTURE) ×4
SUT VIC AB 4-0 SH 27XANBCTRL (SUTURE) ×3 IMPLANT
SUTURE MNCRL 4-0 27XMF (SUTURE) ×3 IMPLANT
SYR 10ML LL (SYRINGE) ×4 IMPLANT
SYR 50ML LL SCALE MARK (SYRINGE) ×4 IMPLANT
TROCAR 5M 150ML BLDLS (TROCAR) ×2 IMPLANT
TROCAR ENDO BLADELESS 11MM (ENDOMECHANICALS) IMPLANT
TROCAR PORT AIRSEAL 8X100 (TROCAR) ×2 IMPLANT
TROCAR XCEL NON-BLD 5MMX100MML (ENDOMECHANICALS) ×4 IMPLANT
TUBING INSUF HEATED (TUBING) ×4 IMPLANT
TUBING INSUFFLATION (TUBING) ×4 IMPLANT

## 2018-07-31 NOTE — Anesthesia Procedure Notes (Signed)
Procedure Name: Intubation Date/Time: 07/31/2018 1:24 PM Performed by: Henrietta HooverSmith, Khalik Pewitt, CRNA Pre-anesthesia Checklist: Patient identified, Suction available, Patient being monitored and Emergency Drugs available Patient Re-evaluated:Patient Re-evaluated prior to induction Oxygen Delivery Method: Circle system utilized Preoxygenation: Pre-oxygenation with 100% oxygen Induction Type: IV induction, Cricoid Pressure applied and Rapid sequence Ventilation: Mask ventilation without difficulty Laryngoscope Size: McGraph and 4 Grade View: Grade I Tube type: Oral Tube size: 7.0 mm Number of attempts: 1 Airway Equipment and Method: Stylet and Video-laryngoscopy (Elected to use McGraph d/t MP III.) Placement Confirmation: ETT inserted through vocal cords under direct vision,  positive ETCO2 and breath sounds checked- equal and bilateral Secured at: 21 cm Tube secured with: Tape Dental Injury: Teeth and Oropharynx as per pre-operative assessment

## 2018-07-31 NOTE — Discharge Instructions (Addendum)
AMBULATORY SURGERY  DISCHARGE INSTRUCTIONS   1) The drugs that you were given will stay in your system until tomorrow so for the next 24 hours you should not:  A) Drive an automobile B) Make any legal decisions C) Drink any alcoholic beverage   2) You may resume regular meals tomorrow.  Today it is better to start with liquids and gradually work up to solid foods.  You may eat anything you prefer, but it is better to start with liquids, then soup and crackers, and gradually work up to solid foods.   3) Please notify your doctor immediately if you have any unusual bleeding, trouble breathing, redness and pain at the surgery site, drainage, fever, or pain not relieved by medication.    4) Additional Instructions:        Please contact your physician with any problems or Same Day Surgery at [phone], Monday through Friday 6 am to 4 pm, or [place] [facility] Main number at [phone].Discharge instructions after   total laparoscopic hysterectomy   For the next three days, take acetaminophen on a schedule, every 8 hours. I also gave you gabapentin for nighttime, to help you sleep and also to control pain. Take gabapentin medicines at night for at least the next 3 nights. You also have a narcotic, oxycodone, to take as needed if the above medicines don't help.  Postop constipation is a major cause of pain. Stay well hydrated, walk as you tolerate, and take over the counter senna as well as stool softeners if you need them.    Signs and Symptoms to Report Call our office at [phone] if you have any of the following.   Fever over 100.4 degrees or higher  Severe stomach pain not relieved with pain medications  Bright red bleeding thats heavier than a period that does not slow with rest  To go the bathroom a lot (frequency), you cant hold your urine (urgency), or it hurts when you empty your bladder (urinate)  Chest pain  Shortness of breath  Pain in the  calves of your legs  Severe nausea and vomiting not relieved with anti-nausea medications  Signs of infection around your wounds, such as redness, hot to touch, swelling, green/yellow drainage (like pus), bad smelling discharge  Any concerns  What You Can Expect after Surgery  You may see some pink tinged, bloody fluid and bruising around the wound. This is normal.  You may notice shoulder and neck pain. This is caused by the gas used during surgery to expand your abdomen so your surgeon could get to the uterus easier.  You may have a sore throat because of the tube in your mouth during general anesthesia. This will go away in 2 to 3 days.  You may have some stomach cramps.  You may notice spotting on your panties.  You may have pain around the incision sites.   Activities after Your Discharge Follow these guidelines to help speed your recovery at home:  Do the coughing and deep breathing as you did in the hospital for 2 weeks. Use the small blue breathing device, called the incentive spirometer for 2 weeks.  Dont drive if you are in pain or taking narcotic pain medicine. You may drive when you can safely slam on the brakes, turn the wheel forcefully, and rotate your torso comfortably. This is typically 1-2 weeks. Practice in a parking lot or side street prior to attempting to drive regularly.   Ask others to help  with household chores for 4 weeks.  Do not lift anything heavier that 10 pounds for 4-6 weeks. This includes pets, children, and groceries.  Dont do strenuous activities, exercises, or sports like vacuuming, tennis, squash, etc. until your doctor says it is safe to do so. ---Maintain pelvic rest for 8 weeks. This means nothing in the vagina or rectum at all (no douching, tampons, intercourse) for 8 weeks.   Walk as you feel able. Rest often since it may take two or three weeks for your energy level to return to normal.   You may climb stairs  Avoid constipation:    -Eat fruits, vegetables, and whole grains. Eat small meals as your appetite will take time to return to normal.   -Drink 6 to 8 glasses of water each day unless your doctor has told you to limit your fluids.   -Use a laxative or stool softener as needed if constipation becomes a problem. You may take Miralax, metamucil, Citrucil, Colace, Senekot, FiberCon, etc. If this does not relieve the constipation, try two tablespoons of Milk Of Magnesia every 8 hours until your bowels move.   You may shower. Gently wash the wounds with a mild soap and water. Pat dry.  Do not get in a hot tub, swimming pool, etc. for 6 weeks.  Do not use lotions, oils, powders on the wounds.  Do not douche, use tampons, or have sex until your doctor says it is okay.  Take your pain medicine when you need it. The medicine may not work as well if the pain is bad.  Take the medicines you were taking before surgery. Other medications you will need are pain medications and possibly constipation and nausea medications (Zofran).

## 2018-07-31 NOTE — Transfer of Care (Signed)
Immediate Anesthesia Transfer of Care Note  Patient: Joan [name]  Procedure(s) Performed: HYSTERECTOMY TOTAL LAPAROSCOPIC (N/A ) LAPAROSCOPIC BILATERAL SALPINGECTOMY (Bilateral ) LAPAROSCOPIC OOPHORECTOMY (Right ) CYSTOSCOPY (N/A ) LYSIS OF ADHESION (N/A )  Patient Location: PACU  Anesthesia Type:General  Level of Consciousness: sedated  Airway & Oxygen Therapy: Patient Spontanous Breathing and Patient connected to face mask oxygen  Post-op Assessment: Report given to RN and Post -op Vital signs reviewed and stable  Post vital signs: Reviewed and stable  Last Vitals:  Vitals Value Taken Time  BP 133/90 07/31/2018  4:37 PM  Temp 36.7 C 07/31/2018  4:37 PM  Pulse 114 07/31/2018  4:37 PM  Resp 13 07/31/2018  4:37 PM  SpO2 100 % 07/31/2018  4:37 PM    Last Pain:  Vitals:   07/31/18 1636  TempSrc:   PainSc: 0-No pain         Complications: No apparent anesthesia complications

## 2018-07-31 NOTE — Interval H&P Note (Signed)
History and Physical Interval Note:  07/31/2018 10:46 AM  Joan [name]  has presented today for surgery, with the diagnosis of abnormal uterine bleeding, obesity class III  The various methods of treatment have been discussed with the patient and family. After consideration of risks, benefits and other options for treatment, the patient has consented to  Procedure(s): HYSTERECTOMY TOTAL LAPAROSCOPIC (N/A) LAPAROSCOPIC BILATERAL SALPINGECTOMY (Bilateral) LAPAROSCOPIC OOPHORECTOMY (Right) as a surgical intervention .  The patient's history has been reviewed, patient examined, no change in status, stable for surgery.  I have reviewed the patient's chart and labs.  Questions were answered to the patient's satisfaction.     Christeen DouglasBethany Zacariah Belue

## 2018-07-31 NOTE — Anesthesia Post-op Follow-up Note (Signed)
Anesthesia QCDR form completed.        

## 2018-07-31 NOTE — Anesthesia Preprocedure Evaluation (Signed)
Anesthesia Evaluation  Patient identified by MRN, date of birth, ID band Patient awake    Reviewed: Allergy & Precautions, H&P , NPO status , Patient's Chart, lab work & pertinent test results, reviewed documented beta blocker date and time   Airway Mallampati: II  TM Distance: >3 FB Neck ROM: full    Dental  (+) Edentulous Upper, Edentulous Lower   Pulmonary shortness of breath and with exertion, asthma ,    Pulmonary exam normal        Cardiovascular Exercise Tolerance: Good hypertension, On Medications negative cardio ROS Normal cardiovascular exam Rhythm:regular Rate:Normal     Neuro/Psych PSYCHIATRIC DISORDERS Anxiety Depression negative neurological ROS     GI/Hepatic Neg liver ROS, GERD  Medicated,  Endo/Other  negative endocrine ROSdiabetes  Renal/GU negative Renal ROS  negative genitourinary   Musculoskeletal   Abdominal   Peds  Hematology negative hematology ROS (+)   Anesthesia Other Findings Past Medical History: No date: Asthma No date: Auditory hallucinations     Comment:  only after anesthesia No date: Diabetes mellitus without complication (HCC)     Comment:  diet controlled No date: Diabetes mellitus, type II (HCC)     Comment:  insulin, jardiance No date: Dyspnea     Comment:  with exertion No date: Essential hypertension 11/24/2017: Essential hypertension No date: GERD (gastroesophageal reflux disease)     Comment:  occasionally No date: MDD (major depressive disorder) No date: Miscarriage No date: PTSD (post-traumatic stress disorder) Past Surgical History: No date: ADENOIDECTOMY 2009: CHOLECYSTECTOMY No date: OVARY SURGERY; Right     Comment:  cyst removed a while ago No date: TONSILLECTOMY No date: tubes in ear   Reproductive/Obstetrics negative OB ROS                             Anesthesia Physical Anesthesia Plan  ASA: III  Anesthesia Plan:  General ETT   Post-op Pain Management:    Induction:   PONV Risk Score and Plan:   Airway Management Planned:   Additional Equipment:   Intra-op Plan:   Post-operative Plan:   Informed Consent: I have reviewed the patients History and Physical, chart, labs and discussed the procedure including the risks, benefits and alternatives for the proposed anesthesia with the patient or authorized representative who has indicated his/her understanding and acceptance.   Dental Advisory Given  Plan Discussed with: CRNA  Anesthesia Plan Comments: (Risk of Post op ventilation discussed with patient..Accepted. JA)        Anesthesia Quick Evaluation

## 2018-07-31 NOTE — Op Note (Signed)
Joan [name] PROCEDURE DATE: 07/31/2018  PREOPERATIVE DIAGNOSIS: Abnormal bleeding, class 3 obesity. POSTOPERATIVE DIAGNOSIS: The same PROCEDURE: Total laparoscopic hysterectomy, bilateral salpingectomy, right  oophorectomy, cystoscopy, lysis of adhesions. **This case was made particularly difficult by her abdominal obesity, more than expected for her BMI and causing significant more time in the OR, with difficulty with visualization of all planes ** SURGEON:  Dr. Christeen Douglas ASSISTANT: Dr. Leeroy Bock Ward Anesthesiologist:  Anesthesiologist: Yevette Edwards, MD; Lenard Simmer, MD CRNA: Henrietta Hoover, CRNA; Karoline Caldwell, CRNA  INDICATIONS: 37 y.o. F with irregular and heavy bleeding, severe abdominal obesity, diabetes and HTN, here for definitive surgical management secondary to the indications listed under preoperative diagnoses; please see preoperative note for further details.  Risks of surgery were discussed with the patient including but not limited to: bleeding which may require transfusion or reoperation; infection which may require antibiotics; injury to bowel, bladder, ureters or other surrounding organs; need for additional procedures; thromboembolic phenomenon, incisional problems and other postoperative/anesthesia complications. Written informed consent was obtained.    FINDINGS:  Enlarged mobile uterus, normal bilateral tubes and ovaries. Right bowel adhesions to the right ovary with the appendix adherent into the pelvis. Very long cervix.    ANESTHESIA:    General INTRAVENOUS FLUIDS:1000  ml ESTIMATED BLOOD LOSS:300 ml URINE OUTPUT: 500 ml   SPECIMENS: Uterus, cervix, bilateral fallopian tubes and right ovary COMPLICATIONS: None immediate  PROCEDURE IN DETAIL:  The patient received prophalactic intravenous antibiotics and had sequential compression devices applied to her lower extremities while in the preoperative area.  She was then taken to the operating room where general  anesthesia was administered and was found to be adequate.  She was placed in the dorsal lithotomy position, and was prepped and draped in a sterile manner.  A formal time out was performed with all team members present and in agreement.  A V-care uterine manipulator was placed at this time.  A Foley catheter was inserted into her bladder and attached to constant drainage. Attention was turned to the abdomen and 0.5% Marcaine infused subq. A 5mm umbilical incision was made with the scalpel.  The Optiview 5-mm trocar and sleeve were then advanced with difficulty with the laparoscope under direct visualization into the abdomen, requiring the bariatric trocar and several attempts.  The abdomen was then insufflated with carbon dioxide gas and adequate pneumoperitoneum was obtained.  A survey of the patient's pelvis and abdomen revealed the findings above.  Bilateral lower quadrant ports (5 mm on the right and 11 mm on the left) were then placed under direct visualization.  The pelvis was then carefully examined.  Attention was turned to the fallopian tubes; these were freed from the underlying mesosalpinx and the uterine attachments using the Ligasure device.    On the right, the right retroperitoneal space was developed, after evaluation of the course of the ureter and with careful attention to avoiding damage to the pelvic sidewall.  The bowel was taken down retroperitoneally, and the appendix removed from the pelvis and its attachment to the right adnexa.   The bilateral round and broad ligaments were then clamped and transected with the Ligasure device.   The uterine artery was then skeletonized and a bladder flap was created.  The ureters were noted to be safely away from the area of dissection.  The bladder was then bluntly dissected off the lower uterine segment.    At this point, attention was turned to the uterine vessels, which were clamped and cauterized  using the Ligasure on the left, and then the  right. After the uterine blood flow at the level of the internal os was controlled, both arteries were cut with the Ligasure.  Good hemostasis was noted overall.  The uterosacral and cardinal ligaments were clamped, cut and ligated bilaterally .  Attention was then turned to the cervicovaginal junction, and monopolar scissors were used to transect the cervix from the surrounding vagina using the ring of the V-care as a guide. This was done circumferentially allowing total hysterectomy.  The uterus was then removed into the vagina.  Significant and brisk bleeding from the right vaginal cuff angle was noted on returning attention to the peritoneal cavity.  During this time, her EBL was 300 mL.  This vessel was carefully cauterized with the LigaSure, and and the vaginal cuff incision was then closed with running V-loc suture.  Overall excellent hemostasis was noted at the conclusion of cuff closure.  The ureters were reexamined bilaterally and were pulsating normally. The abdominal pressure was reduced and hemostasis was confirmed.   A cystoscopy showed bilateral ureteral jets.  No stitches were visualized in the bladder during cystoscopy.  The 11mm port fascia was closed with a vertical mattress with 0-Vicryl, using the cone closure system. All trocars were removed under direct visualization, and the abdomen was desufflated.  All skin incisions were closed with 4-0 Vicryl subcuticular stitches and Dermabond. The patient tolerated the procedures well.  All instruments, needles, and sponge counts were correct x 2. The patient was taken to the recovery room awake, extubated and in stable condition.

## 2018-07-31 NOTE — Anesthesia Postprocedure Evaluation (Signed)
Anesthesia Post Note  Patient: Joan [name]  Procedure(s) Performed: HYSTERECTOMY TOTAL LAPAROSCOPIC (N/A ) LAPAROSCOPIC BILATERAL SALPINGECTOMY (Bilateral ) LAPAROSCOPIC OOPHORECTOMY (Right ) CYSTOSCOPY (N/A ) LYSIS OF ADHESION (N/A )  Patient location during evaluation: PACU Anesthesia Type: General Level of consciousness: awake and alert Pain management: pain level controlled Vital Signs Assessment: post-procedure vital signs reviewed and stable Respiratory status: spontaneous breathing, nonlabored ventilation, respiratory function stable and patient connected to nasal cannula oxygen Cardiovascular status: blood pressure returned to baseline and stable Postop Assessment: no apparent nausea or vomiting Anesthetic complications: no     Last Vitals:  Vitals:   07/31/18 1728 07/31/18 1820  BP: 129/81 130/80  Pulse: (!) 125 (!) 125  Resp: 18 20  Temp: (!) 36.1 C   SpO2: 96%     Last Pain:  Vitals:   07/31/18 1820  TempSrc:   PainSc: 3                  Joan [name]

## 2018-08-01 ENCOUNTER — Encounter: Payer: Self-pay | Admitting: Obstetrics and Gynecology

## 2018-08-04 LAB — SURGICAL PATHOLOGY

## 2018-08-11 ENCOUNTER — Ambulatory Visit: Payer: BLUE CROSS/BLUE SHIELD | Admitting: Licensed Clinical Social Worker

## 2018-08-14 ENCOUNTER — Other Ambulatory Visit: Payer: Self-pay | Admitting: Internal Medicine

## 2018-08-14 ENCOUNTER — Other Ambulatory Visit: Payer: Self-pay | Admitting: Obstetrics and Gynecology

## 2018-08-14 ENCOUNTER — Ambulatory Visit
Admission: RE | Admit: 2018-08-14 | Discharge: 2018-08-14 | Disposition: A | Discharge status: Home / Self Care | Payer: BLUE CROSS/BLUE SHIELD | Source: Ambulatory Visit | Attending: Obstetrics and Gynecology | Admitting: Obstetrics and Gynecology

## 2018-08-14 ENCOUNTER — Other Ambulatory Visit: Payer: Self-pay | Admitting: Primary Care

## 2018-08-14 ENCOUNTER — Other Ambulatory Visit: Payer: Self-pay | Admitting: Psychiatry

## 2018-08-14 DIAGNOSIS — G8918 Other acute postprocedural pain: Secondary | ICD-10-CM

## 2018-08-14 DIAGNOSIS — F3161 Bipolar disorder, current episode mixed, mild: Secondary | ICD-10-CM

## 2018-08-14 DIAGNOSIS — N898 Other specified noninflammatory disorders of vagina: Secondary | ICD-10-CM | POA: Insufficient documentation

## 2018-08-14 DIAGNOSIS — R102 Pelvic and perineal pain: Secondary | ICD-10-CM

## 2018-08-14 DIAGNOSIS — E119 Type 2 diabetes mellitus without complications: Secondary | ICD-10-CM

## 2018-08-14 MED ORDER — IOHEXOL 350 MG/ML SOLN
100.0000 mL | Freq: Once | INTRAVENOUS | Status: AC | PRN
  Administered 2018-08-14: 100 mL via INTRAVENOUS

## 2018-08-14 NOTE — Telephone Encounter (Signed)
Last prescribed on 06/11/2018 Last office visit on 07/22/2018. Next future appointment on 08/28/2018

## 2018-08-16 NOTE — Telephone Encounter (Signed)
Noted, refill sent to pharmacy. 

## 2018-08-17 ENCOUNTER — Emergency Department: Payer: BLUE CROSS/BLUE SHIELD

## 2018-08-17 ENCOUNTER — Other Ambulatory Visit: Payer: Self-pay

## 2018-08-17 ENCOUNTER — Emergency Department
Admission: EM | Admit: 2018-08-17 | Discharge: 2018-08-17 | Disposition: A | Discharge status: Home / Self Care | Payer: BLUE CROSS/BLUE SHIELD | Attending: Emergency Medicine | Admitting: Emergency Medicine

## 2018-08-17 ENCOUNTER — Encounter: Payer: Self-pay | Admitting: Emergency Medicine

## 2018-08-17 DIAGNOSIS — E119 Type 2 diabetes mellitus without complications: Secondary | ICD-10-CM | POA: Insufficient documentation

## 2018-08-17 DIAGNOSIS — Z794 Long term (current) use of insulin: Secondary | ICD-10-CM | POA: Insufficient documentation

## 2018-08-17 DIAGNOSIS — I10 Essential (primary) hypertension: Secondary | ICD-10-CM | POA: Insufficient documentation

## 2018-08-17 DIAGNOSIS — N939 Abnormal uterine and vaginal bleeding, unspecified: Secondary | ICD-10-CM | POA: Insufficient documentation

## 2018-08-17 DIAGNOSIS — J45909 Unspecified asthma, uncomplicated: Secondary | ICD-10-CM | POA: Insufficient documentation

## 2018-08-17 DIAGNOSIS — Z90710 Acquired absence of both cervix and uterus: Secondary | ICD-10-CM | POA: Insufficient documentation

## 2018-08-17 DIAGNOSIS — Z79899 Other long term (current) drug therapy: Secondary | ICD-10-CM | POA: Insufficient documentation

## 2018-08-17 LAB — CBC WITH DIFFERENTIAL/PLATELET
Abs Immature Granulocytes: 0.01 10*3/uL (ref 0.00–0.07)
Basophils Absolute: 0 10*3/uL (ref 0.0–0.1)
Basophils Relative: 1 %
Eosinophils Absolute: 0.4 10*3/uL (ref 0.0–0.5)
Eosinophils Relative: 6 %
HCT: 37.8 % (ref 36.0–46.0)
Hemoglobin: 12.3 g/dL (ref 12.0–15.0)
Immature Granulocytes: 0 %
Lymphocytes Relative: 32 %
Lymphs Abs: 2 10*3/uL (ref 0.7–4.0)
MCH: 26.9 pg (ref 26.0–34.0)
MCHC: 32.5 g/dL (ref 30.0–36.0)
MCV: 82.7 fL (ref 80.0–100.0)
Monocytes Absolute: 0.3 10*3/uL (ref 0.1–1.0)
Monocytes Relative: 6 %
Neutro Abs: 3.3 10*3/uL (ref 1.7–7.7)
Neutrophils Relative %: 55 %
Platelets: 362 10*3/uL (ref 150–400)
RBC: 4.57 MIL/uL (ref 3.87–5.11)
RDW: 13.1 % (ref 11.5–15.5)
WBC: 6 10*3/uL (ref 4.0–10.5)
nRBC: 0 % (ref 0.0–0.2)

## 2018-08-17 LAB — COMPREHENSIVE METABOLIC PANEL
ALT: 24 U/L (ref 0–44)
AST: 25 U/L (ref 15–41)
Albumin: 3.6 g/dL (ref 3.5–5.0)
Alkaline Phosphatase: 126 U/L (ref 38–126)
Anion gap: 8 (ref 5–15)
BUN: 9 mg/dL (ref 6–20)
CO2: 25 mmol/L (ref 22–32)
Calcium: 8.5 mg/dL — ABNORMAL LOW (ref 8.9–10.3)
Chloride: 102 mmol/L (ref 98–111)
Creatinine, Ser: 0.57 mg/dL (ref 0.44–1.00)
GFR calc Af Amer: >60 mL/min (ref >60)
GFR calc non Af Amer: >60 mL/min (ref >60)
Glucose, Bld: 195 mg/dL — ABNORMAL HIGH (ref 70–99)
Potassium: 3.7 mmol/L (ref 3.5–5.1)
Sodium: 135 mmol/L (ref 135–145)
Total Bilirubin: 0.3 mg/dL (ref 0.3–1.2)
Total Protein: 6.9 g/dL (ref 6.5–8.1)

## 2018-08-17 LAB — HEMOGLOBIN AND HEMATOCRIT, BLOOD
HCT: 37.2 % (ref 36.0–46.0)
Hemoglobin: 12 g/dL (ref 12.0–15.0)

## 2018-08-17 NOTE — ED Provider Notes (Signed)
[facility] Emergency Department Provider Note   ____________________________________________   First MD Initiated Contact with Patient 08/17/18 [phone]     (approximate)  I have reviewed the triage vital signs and the nursing notes.   HISTORY  Chief Complaint Vaginal Bleeding    HPI Joan [name] is a 38 y.o. female patient reports she had a hysterectomy with Dr. Leafy Ro on the 27th.  She was doing well up until Today when she began having a lot of bleeding.  She is not having any pain.  She is not lightheaded or weak.  She called Dr. Leafy Ro and was told to come in.  Past Medical History:  Diagnosis Date  . Asthma   . Auditory hallucinations    only after anesthesia  . Diabetes mellitus without complication (HCC)    diet controlled  . Diabetes mellitus, type II (HCC)    insulin, jardiance  . Dyspnea    with exertion  . Essential hypertension   . Essential hypertension 11/24/2017  . GERD (gastroesophageal reflux disease)    occasionally  . MDD (major depressive disorder)   . Miscarriage   . PTSD (post-traumatic stress disorder)     Patient Active Problem List   Diagnosis Date Noted  . Recurrent sinusitis 05/20/2018  . History of placement of ear tubes 05/20/2018  . Fever and chills 05/08/2018  . URI (upper respiratory infection) 05/08/2018  . Tachycardia 05/04/2018  . Missed period 04/29/2018  . Asthma 04/10/2018  . RLQ abdominal pain 02/13/2018  . BV (bacterial vaginosis) 02/13/2018  . Type 2 diabetes mellitus ([place]) 11/24/2017  . Essential hypertension 11/24/2017  . Hyperlipidemia 11/24/2017  . Major depressive disorder, recurrent, severe without psychotic features ([place])   . PTSD (post-traumatic stress disorder) 04/11/2015    Past Surgical History:  Procedure Laterality Date  . ABDOMINAL HYSTERECTOMY    . ADENOIDECTOMY    . CHOLECYSTECTOMY  2009  . CYSTOSCOPY N/A 07/31/2018   Procedure: CYSTOSCOPY;  Surgeon: Benjaman Kindler, MD;  Location: [facility] ORS;  Service: Gynecology;  Laterality: N/A;  . LAPAROSCOPIC BILATERAL SALPINGECTOMY Bilateral 07/31/2018   Procedure: LAPAROSCOPIC BILATERAL SALPINGECTOMY;  Surgeon: Benjaman Kindler, MD;  Location: [facility] ORS;  Service: Gynecology;  Laterality: Bilateral;  . LAPAROSCOPIC HYSTERECTOMY N/A 07/31/2018   Procedure: HYSTERECTOMY TOTAL LAPAROSCOPIC;  Surgeon: Benjaman Kindler, MD;  Location: [facility] ORS;  Service: Gynecology;  Laterality: N/A;  . LYSIS OF ADHESION N/A 07/31/2018   Procedure: LYSIS OF ADHESION;  Surgeon: Benjaman Kindler, MD;  Location: [facility] ORS;  Service: Gynecology;  Laterality: N/A;  . OVARY SURGERY Right    cyst removed a while ago  . TONSILLECTOMY    . tubes in ear      Prior to Admission medications   Medication Sig Start Date End Date Taking? Authorizing Provider  acetaminophen (TYLENOL) 500 MG tablet Take 1,000 mg by mouth every 6 (six) hours as needed.    [provider]  ARIPiprazole (ABILIFY) 5 MG tablet TAKE 1 AND 1/2 TABLETS(7.5 MG) BY MOUTH DAILY 08/14/18   Ursula Alert, MD  atorvastatin (LIPITOR) 40 MG tablet Take 1 tablet by mouth at bedtime for cholesterol. Patient taking differently: Take 40 mg by mouth at bedtime. Take 1 tablet by mouth at bedtime for cholesterol. 05/28/18   Pleas Koch, NP  blood glucose meter kit and supplies KIT Dispense based on patient and insurance preference. Use up three times daily as directed. (FOR ICD-9 250.00, 250.01). 11/24/17   Pleas Koch, NP  citalopram (  CELEXA) 20 MG tablet TAKE 1 TABLET(20 MG) BY MOUTH DAILY Patient taking differently: Take 20 mg by mouth daily.  07/06/18   Pleas Koch, NP  dicyclomine (BENTYL) 20 MG tablet Take 1 tablet (20 mg total) by mouth 3 (three) times daily as needed (abdominal pain). Patient not taking: Reported on 07/23/2018 07/05/18   Nance Pear, MD  docusate sodium (COLACE) 100 MG capsule Take 1 capsule (100 mg total) by mouth 2 (two) times  daily. To keep stools soft 07/31/18   Benjaman Kindler, MD  doxepin (SINEQUAN) 10 MG capsule TAKE 1 TO 2 CAPSULES(10 TO 20 MG) BY MOUTH AT BEDTIME FOR SLEEP Patient taking differently: Take 10-20 mg by mouth at bedtime.  07/22/18   Ursula Alert, MD  empagliflozin (JARDIANCE) 25 MG TABS tablet Take 25 mg by mouth daily. 12/18/17   Pleas Koch, NP  fluticasone (FLONASE) 50 MCG/ACT nasal spray Place 2 sprays into the nose daily.  03/10/18   [provider]  gabapentin (NEURONTIN) 800 MG tablet Take 1 tablet (800 mg total) by mouth at bedtime for 14 days. Take nightly for 3 days, then up to 14 days as needed 07/31/18 08/14/18  Benjaman Kindler, MD  HYDROcodone-homatropine [facility]) 5-1.5 MG/5ML syrup Take 5 mLs by mouth every 8 (eight) hours as needed for cough. Patient not taking: Reported on 07/23/2018 07/20/18   Tonia Ghent, MD  hydrOXYzine (ATARAX/VISTARIL) 50 MG tablet Take 50 mg by mouth at bedtime.  11/28/17   [provider]  Insulin Glargine (LANTUS) 100 UNIT/ML Solostar Pen Inject 30 units in the morning and 20 units in the evening. Patient taking differently: Inject 20-30 Units into the skin See admin instructions. Inject 30 units subcutaneously in the morning and 20 units in the evening. 06/25/18   Pleas Koch, NP  Insulin Pen Needle (PEN NEEDLES) 31G X 6 MM MISC Use with insulin as directed. 11/25/17   Pleas Koch, NP  ipratropium-albuterol (DUONEB) 0.5-2.5 (3) MG/3ML SOLN Take 3 mLs by nebulization every 6 (six) hours as needed. 04/16/18   Laverle Hobby, MD  losartan (COZAAR) 100 MG tablet Take 1 tablet by mouth once daily for blood pressure. Patient taking differently: Take 100 mg by mouth daily. Take 1 tablet by mouth once daily for blood pressure. 12/18/17   Pleas Koch, NP  montelukast (SINGULAIR) 10 MG tablet TAKE 1 TABLET(10 MG) BY MOUTH AT BEDTIME 08/14/18   Laverle Hobby, MD  ondansetron (ZOFRAN ODT) 4 MG disintegrating  tablet Take 1 tablet (4 mg total) by mouth every 8 (eight) hours as needed for nausea or vomiting. Patient not taking: Reported on 07/23/2018 07/22/18   Pleas Koch, NP  ondansetron (ZOFRAN) 4 MG tablet Take 1 tablet (4 mg total) by mouth every 8 (eight) hours as needed for nausea or vomiting. Patient not taking: Reported on 07/23/2018 07/05/18   Nance Pear, MD  oxycodone (OXY-IR) 5 MG capsule Take 1 capsule (5 mg total) by mouth every 6 (six) hours as needed for pain. 07/31/18   Benjaman Kindler, MD  Spacer/Aero-Holding Chambers (AEROCHAMBER PLUS) inhaler Use as instructed. Generic spacer if possible 03/30/18   Copland, Frederico Hamman, MD  TRULICITY 9.37 JI/9.6VE SOPN INJECT 0.75MG UNDER THE SKIN ONCE WEEKLY 08/16/18   Pleas Koch, NP  VENTOLIN HFA 108 (90 Base) MCG/ACT inhaler INHALE 2 PUFFS BY MOUTH EVERY 6 HOURS AS NEEDED FOR SHORTNESS OF BREATH AND WHEEZING Patient taking differently: Inhale 2 puffs into the lungs every 6 (six) hours as  needed for wheezing or shortness of breath.  04/23/18   Pleas Koch, NP  WIXELA INHUB 250-50 MCG/DOSE AEPB INHALE 1 PUFF INTO THE LUNGS TWICE DAILY. RINSE MOUTH AFTER USE 05/22/18   Laverle Hobby, MD    Allergies Ibuprofen; Ciprofloxacin; Metformin and related; Penicillins; and Sulfa antibiotics  Family History  Problem Relation Age of Onset  . Asthma Mother   . Diabetes Mother   . Hyperlipidemia Mother   . Hypertension Mother   . Diabetes Father   . Hyperlipidemia Father   . Hypertension Father   . Diabetes Brother   . Depression Brother   . Alcohol abuse Brother   . Depression Maternal Grandmother   . Stroke Paternal Grandmother     Social History Social History   Tobacco Use  . Smoking status: Never Smoker  . Smokeless tobacco: Never Used  Substance Use Topics  . Alcohol use: No  . Drug use: No    Review of Systems  Constitutional: No fever/chills Eyes: No visual changes. ENT: No sore  throat. Cardiovascular: Denies chest pain. Respiratory: Denies shortness of breath. Gastrointestinal: No abdominal pain.  No nausea, no vomiting.  No diarrhea.  No constipation. Genitourinary: Negative for dysuria. Musculoskeletal: Negative for back pain. Skin: Negative for rash. Neurological: Negative for headaches, focal weakness    ____________________________________________   PHYSICAL EXAM:  VITAL SIGNS: ED Triage Vitals  Enc Vitals Group     BP 08/17/18 0419 (!) 141/101     Pulse Rate 08/17/18 0419 99     Resp 08/17/18 0419 20     Temp 08/17/18 0419 98 F (36.7 C)     Temp Source 08/17/18 0419 Oral     SpO2 08/17/18 0751 98 %     Weight 08/17/18 0420 273 lb (123.8 kg)     Height 08/17/18 0420 _0  (1.676 m)     Head Circumference --      Peak Flow --      Pain Score 08/17/18 0420 9     Pain Loc --      Pain Edu? --      Excl. in [place]? --     Constitutional: Alert and oriented. Well appearing and in no acute distress. Eyes: Conjunctivae are normal.  Head: Atraumatic. Nose: No congestion/rhinnorhea. Mouth/Throat: Mucous membranes are moist.  Oropharynx non-erythematous. Neck: No stridor. Cardiovascular: Normal rate, regular rhythm. Grossly normal heart sounds.  Good peripheral circulation. Respiratory: Normal respiratory effort.  No retractions. Lungs CTAB. Gastrointestinal: Soft and nontender. No distention. No abdominal bruits. No CVA tenderness. Genitourinary: Normal perineum there is some small amount of dark blood in the vagina but no active bleeding I do not see a source of the bleeding either. Musculoskeletal: No lower extremity tenderness nor edema.  No joint effusions. Neurologic:  Normal speech and language. No gross focal neurologic deficits are appreciated. No gait instability. Skin:  Skin is warm, dry and intact. No rash noted. Psychiatric: Mood and affect are normal. Speech and behavior are normal.  ____________________________________________    LABS (all labs ordered are listed, but only abnormal results are displayed)  Labs Reviewed  COMPREHENSIVE METABOLIC PANEL - Abnormal; Notable for the following components:      Result Value   Glucose, Bld 195 (*)    Calcium 8.5 (*)    All other components within normal limits  CBC WITH DIFFERENTIAL/PLATELET  HEMOGLOBIN AND HEMATOCRIT, BLOOD   ____________________________________________  EKG   ____________________________________________  RADIOLOGY  ED MD interpretation: Her sound shows no  apparent pathology  Official radiology report(s): US Pelvic Complete With Transvaginal  Result Date: 08/17/2018 CLINICAL DATA:  Vaginal bleeding.  Hysterectomy 07/31/2018. EXAM: TRANSABDOMINAL AND TRANSVAGINAL ULTRASOUND OF PELVIS TECHNIQUE: Both transabdominal and transvaginal ultrasound examinations of the pelvis were performed. Transabdominal technique was performed for global imaging of the pelvis including uterus, ovaries, adnexal regions, and pelvic cul-de-sac. It was necessary to proceed with endovaginal exam following the transabdominal exam to visualize the left ovary. COMPARISON:  Abdominal CT from 3 days ago FINDINGS: Uterus Surgically absent.  Unremarkable vaginal cuff Right ovary Surgically absent per report Left ovary Not visualized Other findings No abnormal free fluid. IMPRESSION: 1. Hysterectomy with unremarkable vaginal cuff. 2. History of right oophorectomy and nonvisualized left ovary. No pelvic mass or fluid. Electronically Signed   By: Monte Fantasia M.D.   On: 08/17/2018 09:22    ____________________________________________   PROCEDURES  Procedure(s) performed:   Procedures  Critical Care performed:   ____________________________________________   INITIAL IMPRESSION / ASSESSMENT AND PLAN / ED COURSE  Gust patient with Dr. Ouida Sills.  Wheezing appears to be looking good.  We will have her follow-up with Dr. Leafy Ro in the office this week.  Return for any  further bleeding.    Clinical Course as of Aug 17 1108  Mon Aug 17, 2018  1110 Total Bilirubin: 0.3 [PM]    Clinical Course User Index [PM] Nena Polio, MD     ____________________________________________   FINAL CLINICAL IMPRESSION(S) / ED DIAGNOSES  Final diagnoses:  Vaginal bleeding     ED Discharge Orders    None       Note:  This document was prepared using Dragon voice recognition software and may include unintentional dictation errors.    Nena Polio, MD 08/17/18 1114

## 2018-08-17 NOTE — ED Notes (Signed)
First Nurse Note: Sue Lush RN consulted about bed placement in regards to triage.  Patient amb. To Rm 16.  Celesta Gentile RN aware of room placement.

## 2018-08-17 NOTE — ED Notes (Addendum)
Pt sitting on recliner with husband in subwait. alert and calm at this time. No distress noted at this time.

## 2018-08-17 NOTE — ED Notes (Signed)
Pt sitting in recliner with husband. States she is still bleeding and has passed a silver dollar sized clot while using the restroom. Ambulatory with steady gait and no obvious distress noted at this time.

## 2018-08-17 NOTE — ED Notes (Signed)
ED Provider at bedside. 

## 2018-08-17 NOTE — ED Triage Notes (Signed)
Pt presents to ED with c/o sudden onset of heavy vaginal bleeding. hysterectomy performed. 07/31/2018. Pt states she got up to urinate between 0300 and 0330 this morning and noticed that she was bleeding heavily. Pt reports she filled two pads in approx 5 min. Called surgeon and was told to come to ED.

## 2018-08-17 NOTE — Discharge Instructions (Addendum)
Please return for return of heavy bleeding lightheadedness or feeling sicker.  Please give Dr. Francena Hanly office a call.  Arrange follow-up later this week.  Currently everything looks normal.

## 2018-08-20 ENCOUNTER — Other Ambulatory Visit: Payer: Self-pay | Admitting: *Deleted

## 2018-08-20 DIAGNOSIS — G4719 Other hypersomnia: Secondary | ICD-10-CM

## 2018-08-22 ENCOUNTER — Other Ambulatory Visit: Payer: Self-pay | Admitting: Internal Medicine

## 2018-08-22 ENCOUNTER — Other Ambulatory Visit: Payer: Self-pay | Admitting: Psychiatry

## 2018-08-22 DIAGNOSIS — F3161 Bipolar disorder, current episode mixed, mild: Secondary | ICD-10-CM

## 2018-08-22 DIAGNOSIS — F4310 Post-traumatic stress disorder, unspecified: Secondary | ICD-10-CM

## 2018-08-25 ENCOUNTER — Ambulatory Visit (INDEPENDENT_AMBULATORY_CARE_PROVIDER_SITE_OTHER): Payer: BLUE CROSS/BLUE SHIELD | Admitting: Primary Care

## 2018-08-25 VITALS — BP 116/82 | HR 103 | Temp 98.0°F | Ht 66.0 in | Wt 282.8 lb

## 2018-08-25 DIAGNOSIS — E119 Type 2 diabetes mellitus without complications: Secondary | ICD-10-CM | POA: Diagnosis not present

## 2018-08-25 DIAGNOSIS — Z794 Long term (current) use of insulin: Secondary | ICD-10-CM

## 2018-08-25 LAB — POCT GLYCOSYLATED HEMOGLOBIN (HGB A1C): Hemoglobin A1C: 8.8 % — AB (ref 4.0–5.6)

## 2018-08-25 MED ORDER — INSULIN GLARGINE 100 UNIT/ML SOLOSTAR PEN: PEN_INJECTOR | SUBCUTANEOUS | 5 refills | Status: DC
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
## 2018-08-25 NOTE — Assessment & Plan Note (Signed)
A1c improved from 10.7 in October 2019 to 8.8 today. Suspect this is largely secondary to initiation of Trulicity. Continue Trulicity, Jardiance.  Increase Lantus to 35 units in the morning to cover for later hyperglycemia. Discussed to notify me via my chart in 2 weeks if she does not see a clear reduction in afternoon/evening glucose readings.  At that point we will increase her Lantus to 40 units, hopefully this will not have to occur. We will see her back in 3 months for a follow-up visit.

## 2018-08-25 NOTE — Patient Instructions (Signed)
We've changed your Lantus to 35 units in the morning.  Continue Trulicity and Jardiance.   Start exercising. You should be getting 150 minutes of moderate intensity exercise weekly.  It is important that you improve your diet. Please limit carbohydrates in the form of white bread, rice, pasta, sweets, fast food, fried food, sugary drinks, etc. Increase your consumption of fresh fruits and vegetables, whole grains, lean protein.  Ensure you are consuming 64 ounces of water daily.  Please schedule a follow up appointment in 3 months for diabetes check.  It was a pleasure to see you today!

## 2018-08-28 ENCOUNTER — Ambulatory Visit: Payer: BLUE CROSS/BLUE SHIELD | Admitting: Primary Care

## 2018-09-13 ENCOUNTER — Other Ambulatory Visit: Payer: Self-pay | Admitting: Primary Care

## 2018-09-13 DIAGNOSIS — E119 Type 2 diabetes mellitus without complications: Secondary | ICD-10-CM

## 2018-09-14 ENCOUNTER — Encounter: Payer: Self-pay | Admitting: Family Medicine

## 2018-09-14 ENCOUNTER — Encounter: Payer: Self-pay | Admitting: *Deleted

## 2018-09-14 ENCOUNTER — Ambulatory Visit: Payer: Self-pay | Admitting: Family Medicine

## 2018-09-14 VITALS — BP 104/80 | HR 110 | Temp 98.3°F | Ht 66.0 in | Wt 282.8 lb

## 2018-09-14 DIAGNOSIS — H9209 Otalgia, unspecified ear: Secondary | ICD-10-CM

## 2018-09-14 DIAGNOSIS — R6883 Chills (without fever): Secondary | ICD-10-CM

## 2018-09-14 DIAGNOSIS — H6591 Unspecified nonsuppurative otitis media, right ear: Secondary | ICD-10-CM

## 2018-09-14 DIAGNOSIS — R52 Pain, unspecified: Secondary | ICD-10-CM

## 2018-09-14 LAB — POC INFLUENZA A&B (BINAX/QUICKVUE)
Influenza A, POC: NEGATIVE
Influenza B, POC: NEGATIVE

## 2018-09-14 MED ORDER — AZITHROMYCIN 250 MG PO TABS: ORAL_TABLET | ORAL | 0 refills | Status: AC
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
## 2018-09-15 ENCOUNTER — Telehealth: Payer: Self-pay | Admitting: Primary Care

## 2018-09-15 NOTE — Telephone Encounter (Signed)
Pt dropped off Health Assessment that needs to be filled out. She also states she needs a copy of TB test that was done. Please call patient when the form and her results are ready to be picked up. Form placed in Rx tower in front office for [employer].

## 2018-09-16 NOTE — Telephone Encounter (Signed)
Place form/paperwork in Joan [name]'s inbox for review and complete if necessary.  

## 2018-09-16 NOTE — Telephone Encounter (Signed)
Completed and placed in Chans inbox. 

## 2018-09-17 NOTE — Telephone Encounter (Signed)
Per DPR, left detail message of Joan [name] comments for patient that form is ready to pick up. Left in the front office.

## 2018-09-25 ENCOUNTER — Encounter: Payer: Self-pay | Admitting: Internal Medicine

## 2018-09-25 ENCOUNTER — Ambulatory Visit: Payer: Self-pay | Admitting: Family Medicine

## 2018-09-25 ENCOUNTER — Ambulatory Visit (INDEPENDENT_AMBULATORY_CARE_PROVIDER_SITE_OTHER): Payer: Self-pay | Admitting: Internal Medicine

## 2018-09-25 VITALS — BP 110/80 | HR 110 | Temp 98.1°F | Ht 66.0 in | Wt 291.0 lb

## 2018-09-25 DIAGNOSIS — J0100 Acute maxillary sinusitis, unspecified: Secondary | ICD-10-CM

## 2018-09-25 HISTORY — DX: Acute maxillary sinusitis, unspecified: J01.00
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
## 2018-09-25 NOTE — Assessment & Plan Note (Signed)
Likely just viral Discussed rest, continuing analgesics If worsens next week, would Rx clindamycin

## 2018-10-03 ENCOUNTER — Other Ambulatory Visit: Payer: Self-pay | Admitting: Primary Care

## 2018-10-03 DIAGNOSIS — F332 Major depressive disorder, recurrent severe without psychotic features: Secondary | ICD-10-CM

## 2018-10-19 DIAGNOSIS — J302 Other seasonal allergic rhinitis: Secondary | ICD-10-CM

## 2018-10-19 MED ORDER — ALBUTEROL SULFATE HFA 108 (90 BASE) MCG/ACT IN AERS: INHALATION_SPRAY | RESPIRATORY_TRACT | 0 refills | Status: DC

## 2018-10-20 ENCOUNTER — Other Ambulatory Visit: Payer: Self-pay

## 2018-10-20 ENCOUNTER — Encounter: Payer: Self-pay | Admitting: Family Medicine

## 2018-10-20 ENCOUNTER — Ambulatory Visit: Payer: Self-pay | Admitting: Family Medicine

## 2018-10-20 ENCOUNTER — Ambulatory Visit: Payer: Self-pay | Admitting: *Deleted

## 2018-10-20 VITALS — BP 110/74 | HR 98 | Temp 98.5°F | Ht 66.0 in | Wt 287.2 lb

## 2018-10-20 DIAGNOSIS — Z794 Long term (current) use of insulin: Secondary | ICD-10-CM

## 2018-10-20 DIAGNOSIS — E119 Type 2 diabetes mellitus without complications: Secondary | ICD-10-CM

## 2018-10-20 DIAGNOSIS — R059 Cough, unspecified: Secondary | ICD-10-CM

## 2018-10-20 DIAGNOSIS — R05 Cough: Secondary | ICD-10-CM

## 2018-10-20 DIAGNOSIS — J4541 Moderate persistent asthma with (acute) exacerbation: Secondary | ICD-10-CM

## 2018-10-20 LAB — POC INFLUENZA A&B (BINAX/QUICKVUE)
Influenza A, POC: NEGATIVE
Influenza B, POC: NEGATIVE

## 2018-10-20 MED ORDER — PREDNISONE 20 MG PO TABS: ORAL_TABLET | ORAL | 0 refills | Status: DC

## 2018-10-20 NOTE — Assessment & Plan Note (Signed)
Likely viral or allergy trigger.  Neg flu test.  Low risk screening for  COVID19.  Treat with prednisone taper, ret, fluids and supportive care.

## 2018-10-20 NOTE — Assessment & Plan Note (Signed)
Titrate insulin up as needed on steroid.

## 2018-10-20 NOTE — Telephone Encounter (Signed)
Flow called from [facility] to triage a pt regarding having an asthma attack. Pt was transferred to RN with having some shortness of breath and coughing. Has hx of asthma. Denies fever. Started yesterday and not sure what triggered it. She used her inhaler but not nebulizer but going to work this morning. She is being sent home now.  Appointment scheduled per request and protocol. Routing to flow at [facility].  Reason for Disposition . [1] Continuous (nonstop) coughing AND [2] keeps from working or sleeping AND [3] not improved after 2 nebulizer or inhaler treatments given 20 minutes apart  Answer Assessment - Initial Assessment Questions 1. RESPIRATORY STATUS: "Describe your breathing?" (e.g., wheezing, shortness of breath, unable to speak, severe coughing)      Coughing, shortness of breath 2. ONSET: "When did this asthma attack begin?"      yesterday 3. TRIGGER: "What do you think triggered this attack?" (e.g., URI, exposure to pollen or other allergen, tobacco smoke)      Not sure 4. PEAK EXPIRATORY FLOW RATE (PEFR): "Do you use a peak flow meter?" If so, ask: "What's the current peak flow? What's your personal best peak flow?"      Not at home 5. SEVERITY: "How bad is this attack?"    - MILD: No SOB at rest, mild SOB with walking, speaks normally in sentences, can lay down, no retractions, pulse < 100. (GREEN Zone: PEFR 80-100%)   - MODERATE: SOB at rest, SOB with minimal exertion and prefers to sit, cannot lie down flat, speaks in phrases, mild retractions, audible wheezing, pulse 100-120. (YELLOW Zone: PEFR 50-80%)    - SEVERE: Very SOB at rest, speaks in single words, struggling to breathe, sitting hunched forward, retractions, usually loud wheezing, sometimes minimal wheezing because of decreased air movement, pulse > 120. (RED Zone: PEFR < 50%).      moderate 6. MEDICATIONS (Inhaler or nebs): "What are your asthma medications?" and "What treatments have you given so far?"   - Quick-relief: albuterol, metaproterenol, salbutamol, or other inhaled or nebulized beta-agonist medicines   - Long-term-control: steroids, cromolyn, or other anti-inflammatory medicines.     Used medications this morning, inhaler and have not used neb 7. OTHER SYMPTOMS: "Do you have any other symptoms? (e.g., runny nose, chest pain, fever)     Low grade temp 99.3 8. PREGNANCY: "Is there any chance you are pregnant?" "When was your last menstrual period?"     No had hysterectomy  Protocols used: ASTHMA ATTACK-A-AH

## 2018-10-20 NOTE — Telephone Encounter (Signed)
[facility] Night - Client TELEPHONE ADVICE RECORD [facility] Patient Name: Joan [name] Gender: Female DOB: 05-Jun-1981 Age: 38 Y 1 M 17 D Return Phone Number: [phone] (Primary) Address: City/State/Zip: [place] [address] Client [facility] Night - Client Client Site [facility] - Night Physician Joan [name] - NP Contact Type Call Who Is Calling Patient / Member / Family / Caregiver Call Type Triage / Clinical Relationship To Patient Self Return Phone Number [phone] (Primary) Chief Complaint BREATHING - shortness of breath or sounds breathless Reason for Call Symptomatic / Request for Health Information Initial Comment Caller thinks that she needs to make an appointment. She is asthmatic. She is coughing and short of breath. Translation No Nurse Assessment Nurse: Laural Benes, RN, Dondra Spry Date/Time Joan [name] Time): 10/20/2018 7:34:48 AM Confirm and document reason for call. If symptomatic, describe symptoms. ---Joan [name] is asthmatic; having trouble breathing; have used her inhalers with this. cough present 99.0 orally Has the patient traveled to [place], [place], [place], [place], or [place] OR had close contact with a person known to have the novel coronavirus illness in the last 14 days? ---No Does the patient have any new or worsening symptoms? ---Yes Will a triage be completed? ---Yes Related visit to physician within the last 2 weeks? ---No Does the PT have any chronic conditions? (i.e. diabetes, asthma, this includes High risk factors for pregnancy, etc.) ---Yes List chronic conditions. ---Asthma Diabetic HTN Depression/PTSD Is the patient pregnant or possibly pregnant? (Ask all females between the ages of 9-55) ---No Is this a behavioral health or substance abuse call? ---No Guidelines Guideline Title Affirmed Question Affirmed Notes Nurse Date/Time (Eastern Time) Asthma  Attack [1] MILD asthma attack (e.g., no SOB at rest, mild SOB with walking, speaks normally in sentences, mild wheezing) AND [2] persists > [address] [place] December 10/20/2018 7:36:55 AM PLEASE NOTE: All timestamps contained within this report are represented as [place] Standard Time. CONFIDENTIALTY NOTICE: This fax transmission is intended only for the addressee. It contains information that is legally privileged, confidential or otherwise protected from use or disclosure. If you are not the intended recipient, you are strictly prohibited from reviewing, disclosing, copying using or disseminating any of this information or taking any action in reliance on or regarding this information. If you have received this fax in error, please notify us immediately by telephone so that we can arrange for its return to [place]. Phone: [phone], Toll-Free: [phone], Fax: [phone] Page: 2 of 2 Call Id: [id number] Guidelines Guideline Title Affirmed Question Affirmed Notes Nurse Date/Time Joan [name] Time) hours on appropriate treatment Disp. Time Joan [name] Time) Disposition Final User 10/20/2018 7:34:04 AM Send to Urgent Queue Joan [name] 10/20/2018 7:41:08 AM See PCP within 24 Hours Yes Laural Benes, RN, Joan [name] Disagree/Comply Comply Caller Understands Yes PreDisposition Call Doctor Care Advice Given Per Guideline SEE PCP WITHIN 24 HOURS: * Start your quick-relief medicine (e.g., albuterol, salbutamol) at the first sign of any coughing or shortness of breath (don't wait for wheezing). * Use inhaler (2 puffs each time) or nebulizer every 4 hours. * Drink a normal amount of liquids (e.g., water). Being adequately hydrated makes it easier to cough up the sticky lung mucus. HAY FEVER: If you have symptoms from hay fever, it's OK to take antihistamines (Reasons: poor control of allergic rhinitis makes asthma worse whereas antihistamines don't make asthma worse). REMOVE ALLERGENS: Take a shower to remove  pollens, animal dander, or other allergens from the body  and hair. AVOID TRIGGERS: Avoid known triggers of asthma attacks (e.g., tobacco smoke, cats, other pets, feather pillows, exercise) * Wheezing is not improved after neb or inhaler * Wheezing is not completely cleared by 5 days * Inhaled asthma medicine (neb or MDI) is needed more often than every 4 hours * You become worse. Referrals REFERRED TO PCP OFFICE

## 2018-10-20 NOTE — Patient Instructions (Addendum)
Rest fluids. Continue albuterol as needed or Duonebs. Start prednisone taper. Increase insulin  by 5 Units if FBS above goal. Call if blood sugars continuing to remain high.

## 2018-10-20 NOTE — Telephone Encounter (Signed)
Pt already seen this AM. FYI to Dr Ermalene Searing.

## 2018-10-20 NOTE — Progress Notes (Signed)
Subjective:    Patient ID: Joan [name], female    DOB: Jul 13, 1981, 38 y.o.   MRN: [id number]  Cough  This is a new (Has been sick off and on in the winter, but was at baseline until yesterday.) problem. The current episode started yesterday. The problem has been gradually worsening. The cough is non-productive. Associated symptoms include ear pain, a fever, rhinorrhea and shortness of breath. Pertinent negatives include no chills, myalgias, nasal congestion, sore throat or wheezing. Associated symptoms comments: Low grade temp 99.58F. Risk factors: nonsmoker. She has tried a beta-agonist inhaler (using albuterol every 4 hours) for the symptoms. Her past medical history is significant for asthma and environmental allergies.  Shortness of Breath  Associated symptoms include ear pain, a fever and rhinorrhea. Pertinent negatives include no sore throat or wheezing. Her past medical history is significant for asthma.   Tested negative for flu on 2/10  Seen on 2/27 .. treated for presumptive flu with tamiflu.   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 no current fever.   no recent antibiotics.  Review of Systems  Constitutional: Positive for fever. Negative for chills.  HENT: Positive for ear pain and rhinorrhea. Negative for sore throat.   Respiratory: Positive for cough and shortness of breath. Negative for wheezing.   Musculoskeletal: Negative for myalgias.  Allergic/Immunologic: Positive for environmental allergies.    Blood pressure 110/74, pulse 98, temperature 98.5 F (36.9 C), temperature source Oral, height 5\' 6"  (1.676 m), weight 287 lb 4 oz (130.3 kg), last menstrual period 07/24/2018, SpO2 98 %. Social History /Family History/Past Medical History reviewed in  detail and updated in EMR if needed.      Objective:   Physical Exam Constitutional:      General: She is not in acute distress.    Appearance: She is well-developed. She is obese. She is not ill-appearing or toxic-appearing.  HENT:     Head: Normocephalic.     Right Ear: Hearing, tympanic membrane, ear canal and external ear normal. Tympanic membrane is not erythematous, retracted or bulging.     Left Ear: Hearing, tympanic membrane, ear canal and external ear normal. Tympanic membrane is not erythematous, retracted or bulging.     Nose: Mucosal edema and rhinorrhea present.     Right Sinus: No maxillary sinus tenderness or frontal sinus tenderness.     Left Sinus: No maxillary sinus tenderness or frontal sinus tenderness.     Mouth/Throat:     Pharynx: Uvula midline.  Eyes:     General: Lids are normal. Lids are everted, no foreign bodies appreciated.     Conjunctiva/sclera: Conjunctivae normal.     Pupils: Pupils are equal, round, and reactive to light.  Neck:     Musculoskeletal: Normal range of motion and neck supple.     Thyroid: No thyroid mass or thyromegaly.     Vascular: No carotid bruit.     Trachea: Trachea normal.  Cardiovascular:     Rate and Rhythm: Normal rate and regular rhythm.     Pulses: Normal pulses.     Heart sounds: Normal heart sounds, S1 normal and S2 normal. No murmur. No friction rub. No gallop.   Pulmonary:     Effort: Pulmonary effort  is normal. No tachypnea or respiratory distress.     Breath sounds: Normal breath sounds. No decreased breath sounds, wheezing, rhonchi or rales.     Comments: Frequent dry cough Skin:    General: Skin is warm and dry.     Findings: No rash.  Neurological:     Mental Status: She is alert.  Psychiatric:        Mood and Affect: Mood is not anxious or depressed.        Speech: Speech normal.        Behavior: Behavior normal. Behavior is cooperative.        Judgment: Judgment normal.           Assessment &  Plan:

## 2018-10-28 ENCOUNTER — Emergency Department
Admission: EM | Admit: 2018-10-28 | Discharge: 2018-10-28 | Disposition: A | Discharge status: Home / Self Care | Payer: Self-pay | Attending: Emergency Medicine | Admitting: Emergency Medicine

## 2018-10-28 ENCOUNTER — Encounter: Payer: Self-pay | Admitting: Emergency Medicine

## 2018-10-28 ENCOUNTER — Telehealth: Payer: Self-pay | Admitting: Primary Care

## 2018-10-28 ENCOUNTER — Emergency Department: Payer: Self-pay

## 2018-10-28 ENCOUNTER — Telehealth: Payer: Self-pay

## 2018-10-28 ENCOUNTER — Other Ambulatory Visit: Payer: Self-pay

## 2018-10-28 DIAGNOSIS — Z79899 Other long term (current) drug therapy: Secondary | ICD-10-CM | POA: Insufficient documentation

## 2018-10-28 DIAGNOSIS — E119 Type 2 diabetes mellitus without complications: Secondary | ICD-10-CM | POA: Insufficient documentation

## 2018-10-28 DIAGNOSIS — J45901 Unspecified asthma with (acute) exacerbation: Secondary | ICD-10-CM | POA: Insufficient documentation

## 2018-10-28 DIAGNOSIS — I10 Essential (primary) hypertension: Secondary | ICD-10-CM | POA: Insufficient documentation

## 2018-10-28 MED ORDER — IPRATROPIUM-ALBUTEROL 0.5-2.5 (3) MG/3ML IN SOLN
3.0000 mL | Freq: Once | RESPIRATORY_TRACT | Status: AC
  Administered 2018-10-28: 3 mL via RESPIRATORY_TRACT
  Filled 2018-10-28: qty 3

## 2018-10-28 MED ORDER — PREDNISONE 20 MG PO TABS
60.0000 mg | ORAL_TABLET | Freq: Once | ORAL | Status: AC
  Administered 2018-10-28: 60 mg via ORAL
  Filled 2018-10-28: qty 3

## 2018-10-28 MED ORDER — IPRATROPIUM-ALBUTEROL 0.5-2.5 (3) MG/3ML IN SOLN: 3.0000 mL | Freq: Once | RESPIRATORY_TRACT | Status: DC

## 2018-10-28 MED ORDER — PREDNISONE 10 MG PO TABS: ORAL_TABLET | ORAL | 0 refills | Status: DC

## 2018-10-28 NOTE — Discharge Instructions (Signed)
Take the prednisone as prescribed and finish the full course.  Follow-up with your regular doctor in 1 to 2 weeks.  Return to the ER for new, worsening, or persistent shortness of breath, chest pain, weakness, fevers, or any other new or worsening symptoms that concern you.

## 2018-10-28 NOTE — Telephone Encounter (Signed)
Noted and agree. 

## 2018-10-28 NOTE — ED Triage Notes (Signed)
Cough , asthma flare up ,

## 2018-10-28 NOTE — Telephone Encounter (Signed)
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 Pt said she feels about the same as when talked earlier. FYI to Mayra Reel NP.

## 2018-10-28 NOTE — Telephone Encounter (Signed)
Pt last seen 10/20/18 and pt finished prednisone taper last week and was breathing better, but has started again with difficulty breathing,no fever, non prod cough,SOB,asthma,has used inhaler not helped,pt has not used neb treatment yet. No travel and no known exposure to covid or flu.Pt request refill prednisone [facility] [address].pt is completing sentences but some of her words break off like a bad phone connection (there is no phone problem though) Jae Dire advised to try neb treatment and pt voiced understanding and if more difficulty breathing pt will go to [facility] or ED. FYI to Mayra Reel NP.

## 2018-10-28 NOTE — Telephone Encounter (Signed)
Error  Sent to traige

## 2018-10-28 NOTE — ED Provider Notes (Signed)
[facility] Emergency Department Provider Note ____________________________________________   First MD Initiated Contact with Patient 10/28/18 1712     (approximate)  I have reviewed the triage vital signs and the nursing notes.   HISTORY  Chief Complaint Cough    HPI Joan [name] is a 38 y.o. female PMH as noted below including asthma who presents with shortness of breath over the last 2 days, gradual onset, associated with nonproductive cough, not associated with fevers or vomiting.  Patient states that she recently had an asthma exacerbation and was on a steroid course until last week.  She called her PMD today and was instructed to come in after she tried 2 nebulizer treatments at home with minimal relief.  Past Medical History:  Diagnosis Date  . Asthma   . Auditory hallucinations    only after anesthesia  . Diabetes mellitus without complication (HCC)    diet controlled  . Diabetes mellitus, type II (HCC)    insulin, jardiance  . Dyspnea    with exertion  . Essential hypertension   . Essential hypertension 11/24/2017  . GERD (gastroesophageal reflux disease)    occasionally  . MDD (major depressive disorder)   . Miscarriage   . PTSD (post-traumatic stress disorder)     Patient Active Problem List   Diagnosis Date Noted  . Acute non-recurrent maxillary sinusitis 09/25/2018  . Recurrent sinusitis 05/20/2018  . History of placement of ear tubes 05/20/2018  . Fever and chills 05/08/2018  . Tachycardia 05/04/2018  . Missed period 04/29/2018  . Asthma exacerbation 04/10/2018  . RLQ abdominal pain 02/13/2018  . BV (bacterial vaginosis) 02/13/2018  . Type 2 diabetes mellitus ([place]) 11/24/2017  . Essential hypertension 11/24/2017  . Hyperlipidemia 11/24/2017  . Major depressive disorder, recurrent, severe without psychotic features ([place])   . PTSD (post-traumatic stress disorder) 04/11/2015    Past Surgical History:  Procedure  Laterality Date  . ABDOMINAL HYSTERECTOMY    . ADENOIDECTOMY    . CHOLECYSTECTOMY  2009  . CYSTOSCOPY N/A 07/31/2018   Procedure: CYSTOSCOPY;  Surgeon: Benjaman Kindler, MD;  Location: [facility] ORS;  Service: Gynecology;  Laterality: N/A;  . LAPAROSCOPIC BILATERAL SALPINGECTOMY Bilateral 07/31/2018   Procedure: LAPAROSCOPIC BILATERAL SALPINGECTOMY;  Surgeon: Benjaman Kindler, MD;  Location: [facility] ORS;  Service: Gynecology;  Laterality: Bilateral;  . LAPAROSCOPIC HYSTERECTOMY N/A 07/31/2018   Procedure: HYSTERECTOMY TOTAL LAPAROSCOPIC;  Surgeon: Benjaman Kindler, MD;  Location: [facility] ORS;  Service: Gynecology;  Laterality: N/A;  . LYSIS OF ADHESION N/A 07/31/2018   Procedure: LYSIS OF ADHESION;  Surgeon: Benjaman Kindler, MD;  Location: [facility] ORS;  Service: Gynecology;  Laterality: N/A;  . OVARY SURGERY Right    cyst removed a while ago  . TONSILLECTOMY    . tubes in ear      Prior to Admission medications   Medication Sig Start Date End Date Taking? Authorizing Provider  acetaminophen (TYLENOL) 500 MG tablet Take 1,000 mg by mouth every 6 (six) hours as needed.    [provider]  ADVAIR DISKUS 250-50 MCG/DOSE AEPB INHALE 1 PUFF INTO THE LUNGS TWICE DAILY. RINSE MOUTH AFTER USE 08/24/18   Laverle Hobby, MD  albuterol (VENTOLIN HFA) 108 (90 Base) MCG/ACT inhaler INHALE 2 PUFFS BY MOUTH EVERY 6 HOURS AS NEEDED FOR SHORTNESS OF BREATH AND WHEEZING 10/19/18   Pleas Koch, NP  ARIPiprazole (ABILIFY) 5 MG tablet TAKE 1 AND 1/2 TABLETS(7.5 MG) BY MOUTH DAILY 08/14/18   Ursula Alert, MD  atorvastatin (LIPITOR) 40  MG tablet Take 1 tablet by mouth at bedtime for cholesterol. Patient taking differently: Take 40 mg by mouth at bedtime. Take 1 tablet by mouth at bedtime for cholesterol. 05/28/18   Pleas Koch, NP  blood glucose meter kit and supplies KIT Dispense based on patient and insurance preference. Use up three times daily as directed. (FOR ICD-9 250.00, 250.01). 11/24/17    Pleas Koch, NP  citalopram (CELEXA) 20 MG tablet TAKE 1 TABLET(20 MG) BY MOUTH DAILY 10/05/18   Pleas Koch, NP  cyclobenzaprine (FLEXERIL) 10 MG tablet Take 1/2 to 1 whole tab every 8 hours as needed 09/07/18   [provider]  dicyclomine (BENTYL) 20 MG tablet Take 1 tablet (20 mg total) by mouth 3 (three) times daily as needed (abdominal pain). 07/05/18   Nance Pear, MD  docusate sodium (COLACE) 100 MG capsule Take 1 capsule (100 mg total) by mouth 2 (two) times daily. To keep stools soft 07/31/18   Benjaman Kindler, MD  doxepin (SINEQUAN) 10 MG capsule TAKE 1 TO 2 CAPSULES(10 TO 20 MG) BY MOUTH AT BEDTIME FOR SLEEP 08/25/18   Ursula Alert, MD  fluticasone (FLONASE) 50 MCG/ACT nasal spray Place 2 sprays into the nose daily.  03/10/18   [provider]  gabapentin (NEURONTIN) 800 MG tablet Take 1 tablet (800 mg total) by mouth at bedtime for 14 days. Take nightly for 3 days, then up to 14 days as needed 07/31/18 08/14/18  Benjaman Kindler, MD  hydrOXYzine (ATARAX/VISTARIL) 50 MG tablet Take 50 mg by mouth at bedtime.  11/28/17   [provider]  Insulin Glargine (LANTUS) 100 UNIT/ML Solostar Pen Inject 35 units in the morning for diabetes. 08/25/18   Pleas Koch, NP  Insulin Pen Needle (PEN NEEDLES) 31G X 6 MM MISC Use with insulin as directed. 11/25/17   Pleas Koch, NP  ipratropium-albuterol (DUONEB) 0.5-2.5 (3) MG/3ML SOLN Take 3 mLs by nebulization every 6 (six) hours as needed. 04/16/18   Laverle Hobby, MD  losartan (COZAAR) 100 MG tablet Take 1 tablet by mouth once daily for blood pressure. Patient taking differently: Take 100 mg by mouth daily. Take 1 tablet by mouth once daily for blood pressure. 12/18/17   Pleas Koch, NP  montelukast (SINGULAIR) 10 MG tablet TAKE 1 TABLET(10 MG) BY MOUTH AT BEDTIME 08/14/18   Laverle Hobby, MD  ondansetron (ZOFRAN ODT) 4 MG disintegrating tablet Take 1 tablet (4 mg total) by mouth  every 8 (eight) hours as needed for nausea or vomiting. 07/22/18   Pleas Koch, NP  ondansetron (ZOFRAN) 4 MG tablet Take 1 tablet (4 mg total) by mouth every 8 (eight) hours as needed for nausea or vomiting. 07/05/18   Nance Pear, MD  oxycodone (OXY-IR) 5 MG capsule Take 1 capsule (5 mg total) by mouth every 6 (six) hours as needed for pain. 07/31/18   Benjaman Kindler, MD  predniSONE (DELTASONE) 10 MG tablet Take 6 tabs on day 1 (after the ED visit), 5 tabs on day 2, 4 tabs on day 3, 3 tabs on day 4, 2 tabs on day 5, and 1 tab on day 6 10/28/18   Arta Silence, MD  Spacer/Aero-Holding Chambers (AEROCHAMBER PLUS) inhaler Use as instructed. Generic spacer if possible 03/30/18   Copland, Frederico Hamman, MD    Allergies Ibuprofen; Ciprofloxacin; Metformin and related; Penicillins; and Sulfa antibiotics  Family History  Problem Relation Age of Onset  . Asthma Mother   . Diabetes Mother   .  Hyperlipidemia Mother   . Hypertension Mother   . Diabetes Father   . Hyperlipidemia Father   . Hypertension Father   . Diabetes Brother   . Depression Brother   . Alcohol abuse Brother   . Depression Maternal Grandmother   . Stroke Paternal Grandmother     Social History Social History   Tobacco Use  . Smoking status: Never Smoker  . Smokeless tobacco: Never Used  Substance Use Topics  . Alcohol use: No  . Drug use: No    Review of Systems  Constitutional: No fever/chills. Eyes: No redness. ENT: No sore throat. Cardiovascular: Denies chest pain. Respiratory: Positive for shortness of breath. Gastrointestinal: No vomiting or diarrhea.  Genitourinary: Negative for flank pain. Musculoskeletal: Negative for myalgias. Skin: Negative for rash. Neurological: Negative for headache.   ____________________________________________   PHYSICAL EXAM:  VITAL SIGNS: ED Triage Vitals [10/28/18 1700]  Enc Vitals Group     BP (!) 154/70     Pulse Rate 85     Resp 20     Temp 97.9  F (36.6 C)     Temp Source Oral     SpO2 100 %     Weight 280 lb (127 kg)     Height '5\' 6"'  (1.676 m)     Head Circumference      Peak Flow      Pain Score      Pain Loc      Pain Edu?      Excl. in [place]?     Constitutional: Alert and oriented. Well appearing and in no acute distress. Eyes: Conjunctivae are normal.  Head: Atraumatic. Nose: No congestion/rhinnorhea. Mouth/Throat: Mucous membranes are moist.  Oropharynx clear.  No stridor. Neck: Normal range of motion.  Cardiovascular: Normal rate, regular rhythm. Grossly normal heart sounds.  Good peripheral circulation. Respiratory: Slightly increased respiratory effort.  No retractions.  Decreased breath sounds and scattered wheezing bilaterally.  Good air entry. Gastrointestinal: No distention.  Musculoskeletal: Extremities warm and well perfused.  Neurologic:  Normal speech and language. No gross focal neurologic deficits are appreciated.  Skin:  Skin is warm and dry. No rash noted. Psychiatric: Mood and affect are normal. Speech and behavior are normal.  ____________________________________________   LABS (all labs ordered are listed, but only abnormal results are displayed)  Labs Reviewed - No data to display ____________________________________________  EKG   ____________________________________________  RADIOLOGY  CXR: No focal infiltrate or other acute abnormality  ____________________________________________   PROCEDURES  Procedure(s) performed: No  Procedures  Critical Care performed: No ____________________________________________   INITIAL IMPRESSION / ASSESSMENT AND PLAN / ED COURSE  Pertinent labs & imaging results that were available during my care of the patient were reviewed by me and considered in my medical decision making (see chart for details).  38 year old female with a history of asthma presents with worsening shortness of breath over the last 2 days associated with nonproductive  cough and wheezing, similar to prior asthma exacerbations.  She denies associated fever.  She has no history of exposure to anyone with COVID-19 risk factors or travel to outbreak areas.  On exam, she is overall relatively well-appearing with slightly increased work of breathing but no acute respiratory distress.  She has some wheezing bilaterally.  O2 saturations 100%.  The remainder of the exam is as described above.  Overall presentation is consistent with asthma exacerbation.  We will obtain a chest x-ray, give steroid and bronchodilators and reassess.  ----------------------------------------- 6:37 PM on  10/28/2018 -----------------------------------------  Chest x-ray is negative.  The patient is feeling well after nebs and steroid.  She is appropriate for discharge home.  Return precautions given, and she expresses understanding. ____________________________________________   FINAL CLINICAL IMPRESSION(S) / ED DIAGNOSES  Final diagnoses:  Exacerbation of asthma, unspecified asthma severity, unspecified whether persistent      NEW MEDICATIONS STARTED DURING THIS VISIT:  New Prescriptions   PREDNISONE (DELTASONE) 10 MG TABLET    Take 6 tabs on day 1 (after the ED visit), 5 tabs on day 2, 4 tabs on day 3, 3 tabs on day 4, 2 tabs on day 5, and 1 tab on day 6     Note:  This document was prepared using Dragon voice recognition software and may include unintentional dictation errors.   Arta Silence, MD 10/28/18 Bosie Helper

## 2018-10-28 NOTE — Telephone Encounter (Signed)
Pt called back and has used 2 neb treatments today with no relief; pt is not getting her words out clearly; pt said she does not feel like she is getting any air in. Allayne Gitelman NP advised pt may need IV and pt should go to [facility] ED now. Pt voiced understanding and will go to [facility] ED. FYI to Allayne Gitelman NP.

## 2018-10-28 NOTE — ED Notes (Addendum)
Pt states that she feels a tiny bit better s/p nebs. Breath sounds clear bilateral but shallow resp.

## 2018-11-02 NOTE — Telephone Encounter (Signed)
Joan [name], please schedule patient for WebEx call. She will need after hours call. See my chart message.

## 2018-11-03 ENCOUNTER — Ambulatory Visit (INDEPENDENT_AMBULATORY_CARE_PROVIDER_SITE_OTHER): Payer: Self-pay | Admitting: Primary Care

## 2018-11-03 ENCOUNTER — Other Ambulatory Visit: Payer: Self-pay

## 2018-11-03 ENCOUNTER — Encounter: Payer: Self-pay | Admitting: Primary Care

## 2018-11-03 DIAGNOSIS — E119 Type 2 diabetes mellitus without complications: Secondary | ICD-10-CM

## 2018-11-03 DIAGNOSIS — J4540 Moderate persistent asthma, uncomplicated: Secondary | ICD-10-CM

## 2018-11-03 DIAGNOSIS — Z794 Long term (current) use of insulin: Secondary | ICD-10-CM

## 2018-11-03 DIAGNOSIS — J45909 Unspecified asthma, uncomplicated: Secondary | ICD-10-CM | POA: Insufficient documentation

## 2018-11-03 MED ORDER — INSULIN ASPART 100 UNIT/ML FLEXPEN: PEN_INJECTOR | SUBCUTANEOUS | 3 refills | Status: DC

## 2018-11-03 NOTE — Patient Instructions (Signed)
Reduce your Lanuts (long acting) insulin to 50 units once daily.  Start Novolog (fast acting) insulin three times daily before meals. We will start by injecting 10 units before every main meal for blood sugars over 130.   You MUST check your blood sugar at least three times daily before meals if you are injecting the fast acting insulin. It's dangerous not to check your blood sugars when injecting any insulin. You can also check 2 hours later ot see the effects of the insulin. The more you check, the easier it will be to treat your diabetes.  Increase your Advair inhaler use to twice daily, everyday.  Continue Singulair for asthma and allergies.  Start a daily antihistamine such as Zyrtec or Xyzal for allergies. This can be purchased over the counter, the off brand will work just as well.  Please send me blood sugar readings in 2 weeks via My Chart as discussed. Message me sooner if you run into any problems.  It was nice to see you!
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
## 2018-11-03 NOTE — Assessment & Plan Note (Signed)
Uncontrolled, recent steroids are contributing but she was uncontrolled previously. She is running quite high after meals so in order to gain control we will need to introduce meal time coverage.   Reduce Lantus down to 50 units once daily. Add Novolog 10 units TID before meals for glucose readings above 130. She cannot have metformin due to history of lactic acidosis.  She cannot afford Trulicity. We had a long discussion about the importance of checking glucose levels when administering ANY insulin and the dangers associated without glucose monitoring, she verbalized understanding.

## 2018-11-03 NOTE — Telephone Encounter (Signed)
Joan [name], will you see if 7 pm or later will work for her? No later than 7:30 pm.

## 2018-11-03 NOTE — Assessment & Plan Note (Signed)
Uncontrolled given the frequency of Duoneb use. I cannot auscultate lung sounds but I did not hear any aduible wheezing via WebEx video.  Unfortunately she cannot afford to see pulmonology at this time. We will start by having her increase Advair to BID as prescribed. Consider Symbicort if no improvement. Continue Singulair. Add Zyrtec or Xyzal. She will update if symptoms persist.

## 2018-11-04 MED ORDER — PEN NEEDLES 31G X 6 MM MISC: 2 refills | Status: DC

## 2018-11-04 NOTE — Telephone Encounter (Signed)
Joan [name], Will you send her some insulin syringes to [facility] in [place]? She will be injecting three times daily.

## 2018-11-04 NOTE — Telephone Encounter (Signed)
Refill sent as instructed. 

## 2018-11-10 ENCOUNTER — Telehealth: Payer: Self-pay | Admitting: Primary Care

## 2018-11-10 NOTE — Telephone Encounter (Signed)
Best number [phone] Pt called to schedule video appointment with you for her feet swelling.  She wanted a 7pm appointment any day.  Is it ok to schedule??

## 2018-11-10 NOTE — Telephone Encounter (Signed)
Please cancel appointment. Thanks.

## 2018-11-10 NOTE — Telephone Encounter (Signed)
Joan [name], this is the patient needing a video visit. Thanks!

## 2018-11-11 ENCOUNTER — Ambulatory Visit: Payer: Self-pay | Admitting: Primary Care

## 2018-11-23 ENCOUNTER — Encounter: Payer: Self-pay | Admitting: Primary Care

## 2018-11-23 ENCOUNTER — Ambulatory Visit (INDEPENDENT_AMBULATORY_CARE_PROVIDER_SITE_OTHER): Payer: Self-pay | Admitting: Primary Care

## 2018-11-23 DIAGNOSIS — J45901 Unspecified asthma with (acute) exacerbation: Secondary | ICD-10-CM

## 2018-11-23 MED ORDER — PREDNISONE 20 MG PO TABS: ORAL_TABLET | ORAL | 0 refills | Status: DC

## 2018-11-23 MED ORDER — BENZONATATE 200 MG PO CAPS: 200.0000 mg | ORAL_CAPSULE | Freq: Three times a day (TID) | ORAL | 0 refills | Status: DC | PRN

## 2018-11-23 NOTE — Assessment & Plan Note (Signed)
Symptoms for three days, likely asthma exacerbation rather than viral URI.  No fevers or loss of taste/smell, doubt Covid-19. She is stable so we will treat her in the outpatient setting. Rx for Prednisone burst and Tessalon Perles sent to pharmacy. Continue prescribed treatment. Follow up PRN.

## 2018-11-23 NOTE — Patient Instructions (Signed)
Start prednisone 20 mg tablets. Take 2 tablets daily for 5 days.  You may take Benzonatate capsules for cough. Take 1 capsule by mouth three times daily as needed for cough.  Please call your lung doctor if your symptoms do not improve and/or progress. Notify me if you develop fevers.  It was a pleasure to see you today! Mayra Reel, NP-C
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
## 2018-11-24 ENCOUNTER — Ambulatory Visit: Payer: BLUE CROSS/BLUE SHIELD | Admitting: Primary Care

## 2019-01-11 ENCOUNTER — Other Ambulatory Visit: Payer: Self-pay | Admitting: Obstetrics and Gynecology

## 2019-01-11 DIAGNOSIS — N6310 Unspecified lump in the right breast, unspecified quadrant: Secondary | ICD-10-CM

## 2019-01-15 ENCOUNTER — Ambulatory Visit
Admission: RE | Admit: 2019-01-15 | Discharge: 2019-01-15 | Disposition: A | Discharge status: Home / Self Care | Payer: Self-pay | Source: Ambulatory Visit | Attending: Obstetrics and Gynecology | Admitting: Obstetrics and Gynecology

## 2019-01-15 ENCOUNTER — Other Ambulatory Visit: Payer: Self-pay

## 2019-01-15 DIAGNOSIS — N6310 Unspecified lump in the right breast, unspecified quadrant: Secondary | ICD-10-CM | POA: Insufficient documentation

## 2019-01-18 ENCOUNTER — Other Ambulatory Visit: Payer: Self-pay | Admitting: Obstetrics and Gynecology

## 2019-01-18 DIAGNOSIS — N6489 Other specified disorders of breast: Secondary | ICD-10-CM

## 2019-01-19 ENCOUNTER — Other Ambulatory Visit: Payer: Self-pay

## 2019-01-19 ENCOUNTER — Encounter: Payer: Self-pay | Admitting: Radiology

## 2019-01-19 ENCOUNTER — Telehealth (INDEPENDENT_AMBULATORY_CARE_PROVIDER_SITE_OTHER): Payer: Self-pay | Admitting: Primary Care

## 2019-01-19 ENCOUNTER — Telehealth: Payer: Self-pay

## 2019-01-19 DIAGNOSIS — J029 Acute pharyngitis, unspecified: Secondary | ICD-10-CM

## 2019-01-19 LAB — POCT RAPID STREP A (OFFICE): Rapid Strep A Screen: NEGATIVE
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
## 2019-01-19 NOTE — Addendum Note (Signed)
Addended by: Magdalen Spatz C on: 01/19/2019 12:20 PM   Modules accepted: Orders

## 2019-01-19 NOTE — Patient Instructions (Signed)
Call the main line to get tested for strep.  Do not go back to work, stay at home until we can get in touch.  Continue Tylenol/Ibuprofen as needed for sore throat and ear pain.  Use your albuterol inhaler every 4-6 hours as needed for shortness of breath. Please contact me via My Chart if your breathing gets worse.   It was a pleasure to see you today! Allie Bossier, NP-C

## 2019-01-19 NOTE — Addendum Note (Signed)
Addended by: Ellamae Sia on: 01/19/2019 02:00 PM   Modules accepted: Orders

## 2019-01-19 NOTE — Telephone Encounter (Signed)
[place] Night - Client TELEPHONE ADVICE RECORD [facility] Patient Name: Joan [name] Gender: Female DOB: 13-Jul-1981 Age: 38 Y 30 M 17 D Return Phone Number: [phone] (Primary) Address: City/State/Zip: [place] [address] Client [name] Night - Client Client Site [place] Physician Alma Friendly - NP Contact Type Call Who Is Calling Patient / Member / Family / Caregiver Call Type Triage / Clinical Relationship To Patient Self Return Phone Number [phone] (Primary) Chief Complaint BREATHING - shortness of breath or sounds breathless Reason for Call Symptomatic / Request for [place] states she is having sinus issues. Her head, ear, and throat are hurting. She is having shortness of breath. Translation No Nurse Assessment Nurse: Laurance Flatten, RN, Geni Bers Date/Time (Eastern Time): 01/19/2019 7:50:06 AM Confirm and document reason for call. If symptomatic, describe symptoms. ---Caller stated she is having a headache, bilateral earache and throat are hurting. She is having shortness of breath. No fever. Has the patient had close contact with a person known or suspected to have the novel coronavirus illness OR traveled / lives in area with major community spread (including international travel) in the last 14 days from the onset of symptoms? * If Asymptomatic, screen for exposure and travel within the last 14 days. ---No Does the patient have any new or worsening symptoms? ---Yes Will a triage be completed? ---Yes Related visit to physician within the last 2 weeks? ---No Does the PT have any chronic conditions? (i.e. diabetes, asthma, this includes High risk factors for pregnancy, etc.) ---Yes List chronic conditions. ---Asthma, HTN and diabetes Is the patient pregnant or possibly pregnant? (Ask all females between the ages of 68-55) ---No Is this a  behavioral health or substance abuse call? ---No Guidelines Guideline Title Affirmed Question Affirmed Notes Nurse Date/Time Eilene Ghazi Time) Sore Throat Earache also present Laurance Flatten, RN, Geni Bers 01/19/2019 7:53:00 AM Disp. Time Eilene Ghazi Time) Disposition Final User 01/19/2019 7:48:37 AM Send to Urgent Ashley Royalty, Dorena Bodo PLEASE NOTE: All timestamps contained within this report are represented as [place] Standard Time. CONFIDENTIALTY NOTICE: This fax transmission is intended only for the addressee. It contains information that is legally privileged, confidential or otherwise protected from use or disclosure. If you are not the intended recipient, you are strictly prohibited from reviewing, disclosing, copying using or disseminating any of this information or taking any action in reliance on or regarding this information. If you have received this fax in error, please notify us immediately by telephone so that we can arrange for its return to [place]. Phone: [phone], Toll-Free: [phone], Fax: [phone] Page: 2 of 2 Call Id: [id number] 01/19/2019 7:55:25 AM See PCP within 24 Hours Yes Laurance Flatten, RN, Althea Grimmer Disagree/Comply Comply Caller Understands Yes PreDisposition InappropriateToAsk Care Advice Given Per Guideline SEE PCP WITHIN 24 HOURS: * IF OFFICE WILL BE OPEN: You need to be seen within the next 24 hours. Call your doctor (or NP/PA) when the office opens and make an appointment. PAIN OR FEVER MEDICINES: * For pain and fever relief, take acetaminophen or ibuprofen. * Treat fevers above 101 F (38.3 C). ACETAMINOPHEN (E.G., TYLENOL): * The goal of fever therapy is to bring the fever down to a comfortable level. Remember that fever medicine usually lowers fever 2-3 F (1-1.5 C). CALL BACK IF: * You become worse. Referrals REFERRED TO PCP OFFICE

## 2019-01-19 NOTE — Telephone Encounter (Signed)
Patient had virtual visit with PCP today.  She also came by for [facility] COVID testing and strep/thoat culture test.  Results are pending at this point.

## 2019-01-22 NOTE — Telephone Encounter (Signed)
Best number [phone] Pt calling to get covid results  Needs this for work

## 2019-01-23 LAB — SARS-COV-2 RNA,(COVID-19) QUALITATIVE NAAT: SARS CoV2 RNA: NOT DETECTED

## 2019-01-23 LAB — CULTURE, GROUP A STREP
MICRO NUMBER:: 574659
SPECIMEN QUALITY:: ADEQUATE

## 2019-01-25 ENCOUNTER — Telehealth: Payer: Self-pay | Admitting: Primary Care

## 2019-01-25 NOTE — Telephone Encounter (Signed)
Noted. Allie Bossier notified patient through [facility]

## 2019-01-25 NOTE — Telephone Encounter (Signed)
I have sent a letter through MyChart for patient. However, print the work note just in case patient need a printed copy. Left in the front office.

## 2019-01-25 NOTE — Telephone Encounter (Signed)
Best number [phone] Pt called needing a note for work stating her covid testing was neg.  She would like to get note today she is going back to work tomorrow\

## 2019-01-25 NOTE — Telephone Encounter (Signed)
Noted. This was addressed in another telephone encounter.

## 2019-01-28 DIAGNOSIS — E119 Type 2 diabetes mellitus without complications: Secondary | ICD-10-CM

## 2019-01-28 DIAGNOSIS — Z794 Long term (current) use of insulin: Secondary | ICD-10-CM

## 2019-02-03 ENCOUNTER — Encounter: Payer: Self-pay | Admitting: Family Medicine

## 2019-02-03 ENCOUNTER — Ambulatory Visit (INDEPENDENT_AMBULATORY_CARE_PROVIDER_SITE_OTHER): Payer: Self-pay | Admitting: Family Medicine

## 2019-02-03 DIAGNOSIS — R112 Nausea with vomiting, unspecified: Secondary | ICD-10-CM

## 2019-02-03 MED ORDER — ONDANSETRON 4 MG PO TBDP: 4.0000 mg | ORAL_TABLET | Freq: Three times a day (TID) | ORAL | 0 refills | Status: DC | PRN

## 2019-02-03 NOTE — Progress Notes (Signed)
Virtual Visit via Video Note  I connected with Joan [name] on 02/03/19 at 10:15 AM EDT by a video enabled telemedicine application and verified that I am speaking with the correct person using two identifiers.  Location: Patient: In her home Provider: [facility]   I discussed the limitations of evaluation and management by telemedicine and the availability of in person appointments. The patient expressed understanding and agreed to proceed.  History of Present Illness: This is a 38 yo female who presents today with vomiting x 2 days.  Patient once again home episode of vomiting 4 days ago after eating chicken nuggets.  She ate them again last night and has had 4-5 episodes of vomiting.  She has taken some Pepto-Bismol without relief.  She is able to keep water down this morning.  She has not had any fever or diarrhea.  She has some mid abdominal pain that comes and goes and is crampy in nature.  Prior to this episode she was in her normal state of health.  Past Medical History:  Diagnosis Date  . Asthma   . Auditory hallucinations    only after anesthesia  . Diabetes mellitus without complication (HCC)    diet controlled  . Diabetes mellitus, type II (HCC)    insulin, jardiance  . Dyspnea    with exertion  . Essential hypertension   . Essential hypertension 11/24/2017  . GERD (gastroesophageal reflux disease)    occasionally  . MDD (major depressive disorder)   . Miscarriage   . PTSD (post-traumatic stress disorder)    Past Surgical History:  Procedure Laterality Date  . ABDOMINAL HYSTERECTOMY    . ADENOIDECTOMY    . CHOLECYSTECTOMY  2009  . CYSTOSCOPY N/A 07/31/2018   Procedure: CYSTOSCOPY;  Surgeon: Christeen DouglasBeasley, Bethany, MD;  Location: [facility];  Service: Gynecology;  Laterality: N/A;  . LAPAROSCOPIC BILATERAL SALPINGECTOMY Bilateral 07/31/2018   Procedure: LAPAROSCOPIC BILATERAL SALPINGECTOMY;  Surgeon: Christeen DouglasBeasley, Bethany, MD;  Location: [facility] OR[facility];  Service:  Gynecology;  Laterality: Bilateral;  . LAPAROSCOPIC HYSTERECTOMY N/A 07/31/2018   Procedure: HYSTERECTOMY TOTAL LAPAROSCOPIC;  Surgeon: Christeen DouglasBeasley, Bethany, MD;  Location: [facility];  Service: Gynecology;  Laterality: N/A;  . LYSIS OF ADHESION N/A 07/31/2018   Procedure: LYSIS OF ADHESION;  Surgeon: Christeen DouglasBeasley, Bethany, MD;  Location: [facility] OR[facility];  Service: Gynecology;  Laterality: N/A;  . OVARY SURGERY Right    cyst removed a while ago  . TONSILLECTOMY    . tubes in ear     Family History  Problem Relation Age of Onset  . Asthma Mother   . Diabetes Mother   . Hyperlipidemia Mother   . Hypertension Mother   . Diabetes Father   . Hyperlipidemia Father   . Hypertension Father   . Diabetes Brother   . Depression Brother   . Alcohol abuse Brother   . Depression Maternal Grandmother   . Stroke Paternal Grandmother   . Breast cancer Neg Hx    Social History   Tobacco Use  . Smoking status: Never Smoker  . Smokeless tobacco: Never Used  Substance Use Topics  . Alcohol use: No  . Drug use: No      Observations/Objective: The patient is alert and answers questions appropriately.  She is obese.  Visible skin is unremarkable.  She is normally conversive without shortness of breath, audible wheeze or cough.  Mood and affect are appropriate.  LMP 07/24/2018 (Exact Date) Comment: surgery 07/31/2018 Wt Readings from Last 3 Encounters:  10/28/18 280  lb (127 kg)  10/20/18 287 lb 4 oz (130.3 kg)  09/25/18 291 lb (132 kg)    Assessment and Plan: 1. Non-intractable vomiting with nausea, unspecified vomiting type -Possible that this could be foodborne due to sudden onset and occurring after ingesting food the call symptoms on 2 separate occasions -Advised her to avoid offending food -Discussed return to clinic/ER precautions-fever/chills, intractable vomiting, severe diarrhea, severe abdominal pain -Discussed importance of hydration and gradual introduction of food into diet - ondansetron  (ZOFRAN ODT) 4 MG disintegrating tablet; Take 1 tablet (4 mg total) by mouth every 8 (eight) hours as needed for nausea or vomiting.  Dispense: 15 tablet; Refill: 0   Joan Reamer, FNP-BC  [facility]  02/03/2019 10:29 AM   Follow Up Instructions: Visit recap sent to patient via my chart   I discussed the assessment and treatment plan with the patient. The patient was provided an opportunity to ask questions and all were answered. The patient agreed with the plan and demonstrated an understanding of the instructions.   The patient was advised to call back or seek an in-person evaluation if the symptoms worsen or if the condition fails to improve as anticipated.   Elby Beck, FNP

## 2019-02-11 ENCOUNTER — Encounter: Payer: Self-pay | Admitting: Family Medicine

## 2019-02-11 ENCOUNTER — Other Ambulatory Visit: Payer: Self-pay

## 2019-02-11 ENCOUNTER — Telehealth: Payer: Self-pay

## 2019-02-11 ENCOUNTER — Ambulatory Visit: Payer: Self-pay | Admitting: Family Medicine

## 2019-02-11 ENCOUNTER — Ambulatory Visit (INDEPENDENT_AMBULATORY_CARE_PROVIDER_SITE_OTHER): Payer: Self-pay | Admitting: Family Medicine

## 2019-02-11 VITALS — BP 140/94 | HR 107 | Temp 97.3°F | Resp 22 | Ht 66.0 in | Wt 291.0 lb

## 2019-02-11 DIAGNOSIS — N200 Calculus of kidney: Secondary | ICD-10-CM

## 2019-02-11 DIAGNOSIS — E119 Type 2 diabetes mellitus without complications: Secondary | ICD-10-CM

## 2019-02-11 DIAGNOSIS — Z794 Long term (current) use of insulin: Secondary | ICD-10-CM

## 2019-02-11 DIAGNOSIS — R3915 Urgency of urination: Secondary | ICD-10-CM

## 2019-02-11 HISTORY — DX: Calculus of kidney: N20.0

## 2019-02-11 LAB — POCT URINALYSIS DIPSTICK
Bilirubin, UA: NEGATIVE
Blood, UA: NEGATIVE
Glucose, UA: POSITIVE — AB
Ketones, UA: POSITIVE
Leukocytes, UA: NEGATIVE
Nitrite, UA: NEGATIVE
Protein, UA: NEGATIVE
Spec Grav, UA: 1.015 (ref 1.010–1.025)
Urobilinogen, UA: 0.2 E.U./dL
pH, UA: 5.5 (ref 5.0–8.0)

## 2019-02-11 MED ORDER — OXYCODONE HCL 5 MG PO CAPS: 5.0000 mg | ORAL_CAPSULE | ORAL | 0 refills | Status: DC | PRN

## 2019-02-11 MED ORDER — OXYCODONE HCL 5 MG PO TABA: 5.0000 mg | ORAL_TABLET | Freq: Four times a day (QID) | ORAL | 0 refills | Status: DC | PRN

## 2019-02-11 MED ORDER — TAMSULOSIN HCL 0.4 MG PO CAPS: 0.4000 mg | ORAL_CAPSULE | Freq: Every day | ORAL | 0 refills | Status: DC

## 2019-02-11 NOTE — Telephone Encounter (Signed)
Spoke with pharmacist. They have Oxycodone 5 mg just not the type that was sent in- abuse deter-Oxaydo.

## 2019-02-11 NOTE — Assessment & Plan Note (Signed)
Ketones in urine. May also be contributing to urinary frequency symptoms

## 2019-02-11 NOTE — Telephone Encounter (Signed)
Sent to pharmacy 

## 2019-02-11 NOTE — Telephone Encounter (Signed)
Pt called to request another pain med called into [place], [place]. Pharmacy is out of the oxycodone and pt is in a lot pain.

## 2019-02-11 NOTE — Patient Instructions (Signed)

## 2019-02-11 NOTE — Assessment & Plan Note (Signed)
Blood in urine and colicky abdominal/back pain seems most consistent. Also pt s/p right oophorectomy and no specific TTP to suggest appendicitis. Discussed imaging now vs trial of treatment and imaging if not improved and elected to trial treatment first approach. Given strainer.

## 2019-02-11 NOTE — Progress Notes (Signed)
Subjective:     Joan [name] is a 38 y.o. female presenting for Urinary Urgency (frequecy, discomfort with urination. No burning sensation. Lowe back pain and lower right abdomen pain. Nausea present. Vomited yesterday x 2.)     HPI   #urinary urgency - frequency - no dysuria - low back pain and lower right abdominal  - gets pain in those areas when she pees - pain can be severe but has eased up - had food poisoning last week - went several days w/o vomiting  - no right ovary   Review of Systems  Constitutional: Negative for chills and fever.  Gastrointestinal: Positive for abdominal pain, nausea and vomiting.  Genitourinary: Positive for frequency and urgency. Negative for difficulty urinating.  Musculoskeletal: Positive for back pain.     Social History   Tobacco Use  Smoking Status Never Smoker  Smokeless Tobacco Never Used        Objective:    BP Readings from Last 3 Encounters:  02/11/19 (!) 140/94  10/28/18 (!) 151/96  10/20/18 110/74   Wt Readings from Last 3 Encounters:  02/11/19 291 lb (132 kg)  10/28/18 280 lb (127 kg)  10/20/18 287 lb 4 oz (130.3 kg)    BP (!) 140/94   Pulse (!) 107   Temp (!) 97.3 F (36.3 C)   Resp (!) 22   Ht 5\' 6"  (1.676 m)   Wt 291 lb (132 kg)   LMP 07/24/2018 (Exact Date) Comment: surgery 07/31/2018  SpO2 98%   BMI 46.97 kg/m    Physical Exam Constitutional:      General: She is not in acute distress.    Appearance: She is well-developed. She is obese. She is diaphoretic.  HENT:     Right Ear: External ear normal.     Left Ear: External ear normal.     Nose: Nose normal.  Eyes:     Conjunctiva/sclera: Conjunctivae normal.  Neck:     Musculoskeletal: Neck supple.  Cardiovascular:     Rate and Rhythm: Regular rhythm. Tachycardia present.     Heart sounds: No murmur.  Pulmonary:     Effort: Pulmonary effort is normal. No respiratory distress.     Breath sounds: No wheezing.  Abdominal:   General: Bowel sounds are normal. There is no distension.     Palpations: Abdomen is soft.     Tenderness: There is no abdominal tenderness. There is right CVA tenderness. There is no left CVA tenderness.  Skin:    General: Skin is warm.     Capillary Refill: Capillary refill takes less than 2 seconds.  Neurological:     Mental Status: She is alert. Mental status is at baseline.  Psychiatric:        Mood and Affect: Mood normal.        Behavior: Behavior normal.    UA: + blood, + ketones        Assessment & Plan:   Problem List Items Addressed This Visit      Endocrine   Type 2 diabetes mellitus ([place])    Ketones in urine. May also be contributing to urinary frequency symptoms        Genitourinary   Kidney stone - Primary    Blood in urine and colicky abdominal/back pain seems most consistent. Also pt s/p right oophorectomy and no specific TTP to suggest appendicitis. Discussed imaging now vs trial of treatment and imaging if not improved and elected to trial treatment first approach.  Given strainer.       Relevant Medications   OxyCODONE HCl, Abuse Deter, (OXAYDO) 5 MG TABA   tamsulosin (FLOMAX) 0.4 MG CAPS capsule    Other Visit Diagnoses    Urinary urgency       Relevant Orders   POCT urinalysis dipstick (Completed)       No follow-ups on file.  Lynnda ChildJessica R Ladaysha Soutar, MD

## 2019-02-11 NOTE — Telephone Encounter (Signed)
Patient advised.

## 2019-02-11 NOTE — Telephone Encounter (Signed)
Pt vomited x 2 on 02/10/19; no vomiting today. Has rt sided low back pain, frequency of urine and burn upon urination,no covid symptoms, no fever,chills,S/T,cough,muscle pain, SOB,diarrhea, no loss of taste or smell,no travel and no exposure to known covid. Dr Wylene Simmer in office appt due to pts travel distance and time. Pt scheduled in office appt today at 2:20.

## 2019-02-15 ENCOUNTER — Ambulatory Visit (INDEPENDENT_AMBULATORY_CARE_PROVIDER_SITE_OTHER): Payer: Self-pay | Admitting: Family Medicine

## 2019-02-15 ENCOUNTER — Encounter: Payer: Self-pay | Admitting: Family Medicine

## 2019-02-15 ENCOUNTER — Ambulatory Visit
Admission: RE | Admit: 2019-02-15 | Discharge: 2019-02-15 | Disposition: A | Discharge status: Home / Self Care | Payer: Self-pay | Source: Ambulatory Visit | Attending: Family Medicine | Admitting: Family Medicine

## 2019-02-15 ENCOUNTER — Other Ambulatory Visit: Payer: Self-pay

## 2019-02-15 ENCOUNTER — Inpatient Hospital Stay: Admission: RE | Admit: 2019-02-15 | Payer: Self-pay | Source: Ambulatory Visit

## 2019-02-15 ENCOUNTER — Telehealth: Payer: Self-pay

## 2019-02-15 VITALS — BP 142/98 | HR 118 | Temp 98.3°F | Ht 66.0 in | Wt 291.5 lb

## 2019-02-15 DIAGNOSIS — Z794 Long term (current) use of insulin: Secondary | ICD-10-CM

## 2019-02-15 DIAGNOSIS — R300 Dysuria: Secondary | ICD-10-CM

## 2019-02-15 DIAGNOSIS — R1031 Right lower quadrant pain: Secondary | ICD-10-CM

## 2019-02-15 DIAGNOSIS — E119 Type 2 diabetes mellitus without complications: Secondary | ICD-10-CM

## 2019-02-15 DIAGNOSIS — N200 Calculus of kidney: Secondary | ICD-10-CM

## 2019-02-15 LAB — HEMOGLOBIN A1C: Hgb A1c MFr Bld: 13.9 % — ABNORMAL HIGH (ref 4.6–6.5)

## 2019-02-15 LAB — POCT URINALYSIS DIPSTICK
Bilirubin, UA: NEGATIVE
Blood, UA: NEGATIVE
Glucose, UA: POSITIVE — AB
Ketones, UA: POSITIVE
Leukocytes, UA: NEGATIVE
Nitrite, UA: NEGATIVE
Protein, UA: NEGATIVE
Spec Grav, UA: 1.015 (ref 1.010–1.025)
Urobilinogen, UA: 0.2 E.U./dL
pH, UA: 5 (ref 5.0–8.0)

## 2019-02-15 LAB — COMPREHENSIVE METABOLIC PANEL
ALT: 26 U/L (ref 0–35)
AST: 17 U/L (ref 0–37)
Albumin: 4 g/dL (ref 3.5–5.2)
Alkaline Phosphatase: 102 U/L (ref 39–117)
BUN: 14 mg/dL (ref 6–23)
CO2: 24 mEq/L (ref 19–32)
Calcium: 9 mg/dL (ref 8.4–10.5)
Chloride: 95 mEq/L — ABNORMAL LOW (ref 96–112)
Creatinine, Ser: 0.81 mg/dL (ref 0.40–1.20)
GFR: 78.53 mL/min (ref >60.00)
Glucose, Bld: 520 mg/dL (ref 70–99)
Potassium: 4.5 mEq/L (ref 3.5–5.1)
Sodium: 130 mEq/L — ABNORMAL LOW (ref 135–145)
Total Bilirubin: 0.4 mg/dL (ref 0.2–1.2)
Total Protein: 6.7 g/dL (ref 6.0–8.3)

## 2019-02-15 LAB — CBC WITH DIFFERENTIAL/PLATELET
Basophils Absolute: 0 10*3/uL (ref 0.0–0.1)
Basophils Relative: 1 % (ref 0.0–3.0)
Eosinophils Absolute: 0.1 10*3/uL (ref 0.0–0.7)
Eosinophils Relative: 3.1 % (ref 0.0–5.0)
HCT: 40.9 % (ref 36.0–46.0)
Hemoglobin: 13.3 g/dL (ref 12.0–15.0)
Lymphocytes Relative: 30.6 % (ref 12.0–46.0)
Lymphs Abs: 1.3 10*3/uL (ref 0.7–4.0)
MCHC: 32.5 g/dL (ref 30.0–36.0)
MCV: 83.3 fl (ref 78.0–100.0)
Monocytes Absolute: 0.3 10*3/uL (ref 0.1–1.0)
Monocytes Relative: 6 % (ref 3.0–12.0)
Neutro Abs: 2.5 10*3/uL (ref 1.4–7.7)
Neutrophils Relative %: 59.3 % (ref 43.0–77.0)
Platelets: 229 10*3/uL (ref 150.0–400.0)
RBC: 4.91 Mil/uL (ref 3.87–5.11)
RDW: 13.9 % (ref 11.5–15.5)
WBC: 4.3 10*3/uL (ref 4.0–10.5)

## 2019-02-15 MED ORDER — IOPAMIDOL (ISOVUE-300) INJECTION 61%
100.0000 mL | Freq: Once | INTRAVENOUS | Status: AC | PRN
  Administered 2019-02-15: 100 mL via INTRAVENOUS

## 2019-02-15 MED ORDER — TRULICITY 0.75 MG/0.5ML ~~LOC~~ SOAJ: SUBCUTANEOUS | 3 refills | Status: DC

## 2019-02-15 NOTE — Telephone Encounter (Signed)
[facility] called at 1300 to report a critical result of  Glucose 520

## 2019-02-15 NOTE — Telephone Encounter (Addendum)
Spoken to patient. She stated that she did not inject her insulin this morning. Her BS running around 200-300.  She is injecting 50 units BID. Do you want her schedule an appointment?

## 2019-02-15 NOTE — Telephone Encounter (Signed)
Noted. Rx sent. See my chart messages.

## 2019-02-15 NOTE — Patient Instructions (Signed)
Stop the Flomax (Tamsulosin medication)  We will get a CT scan and blood work today

## 2019-02-15 NOTE — Assessment & Plan Note (Signed)
Patient believes she passed the stone this weekend and UA today w/o blood to indicate recurrent stone. Still with RLQ and Right CVA pain w/o signs of Urinary tract infection. Diabetes could be contributing to urinary symptoms. At this point, unclear what is causing her abdominal pain but as persisting will rule-out other causes. No fever so less likely to be appendicitis.

## 2019-02-15 NOTE — Assessment & Plan Note (Signed)
Suspect it will be poorly control. Cost is a factor in medications due to no insurance. Will repeat to help aid PCP management

## 2019-02-15 NOTE — Telephone Encounter (Signed)
Per DPR, left detail message  for patient to call back for blood sugar readings and appointment today.

## 2019-02-15 NOTE — Telephone Encounter (Signed)
Noted, await other results.    Joan [name], this patient should have been scheduled with me today as I had plenty of openings.  Will you please address?  [name], please call patient and ask for blood sugar readings and times. Also, how much of the 70/30 insulin is she injecting and how often?

## 2019-02-15 NOTE — Telephone Encounter (Signed)
Patient stated that she found assistance to help her get the trulicity she is needing for a cheaper rate.  She is needing this sent into the pharmacy    [place]

## 2019-02-15 NOTE — Telephone Encounter (Addendum)
Spoke with patient via phone just now.  1. She is not checking her blood sugars and has not done so in over one month. We discussed that it is absolutely imperative that she start checking blood sugars at least twice daily. Will also send my chart message with instructions.  2. She has not injected her 70/30 insulin in over one month, she can afford the insulin but just hasn't picked it up. She did inject 60 units of her Lantus today that she had remaining from her older prescription. We discussed that she needs to pick up her 70/30 insulin pens from the pharmacy and start with 25 units BID. Again, I stressed the importance of checking her blood sugars.  3. She will send me glucose readings in 2 weeks via my chart. We also discussed her recent glucose reading of 520, she will drink plenty of water and will recheck glucose readings as soon as possible. If her readings remain above 500 she will present to the ED for IV fluids and treatment.   4. [name], please help her get connected with patient assistance for insulin and Trulicity.

## 2019-02-15 NOTE — Progress Notes (Signed)
Subjective:     Joan [name] is a 38 y.o. female presenting for Pain (Right side pain and lower right side back pain/ passed stone 7/12)     HPI  #Right side pain - thinks she passed the stone on 7/12 - was in worsening pain after it passed - and still in some pain on the same side - some burning with urination  Review of Systems  Constitutional: Negative for chills and fever.  Respiratory: Negative for shortness of breath.   Cardiovascular: Negative for chest pain.  Gastrointestinal: Positive for abdominal pain and nausea. Negative for blood in stool, constipation, diarrhea and vomiting.  Genitourinary: Positive for dysuria, frequency, pelvic pain and urgency. Negative for vaginal discharge.  Musculoskeletal: Positive for back pain.     Social History   Tobacco Use  Smoking Status Never Smoker  Smokeless Tobacco Never Used        Objective:    BP Readings from Last 3 Encounters:  02/15/19 (!) 142/98  02/11/19 (!) 140/94  10/28/18 (!) 151/96   Wt Readings from Last 3 Encounters:  02/15/19 291 lb 8 oz (132.2 kg)  02/11/19 291 lb (132 kg)  10/28/18 280 lb (127 kg)    BP (!) 142/98   Pulse (!) 118   Temp 98.3 F (36.8 C)   Ht 5\' 6"  (1.676 m)   Wt 291 lb 8 oz (132.2 kg)   LMP 07/24/2018 (Exact Date) Comment: surgery 07/31/2018  SpO2 97%   BMI 47.05 kg/m    Physical Exam Constitutional:      General: She is not in acute distress.    Appearance: She is well-developed. She is not diaphoretic.  HENT:     Right Ear: External ear normal.     Left Ear: External ear normal.     Nose: Nose normal.  Eyes:     Conjunctiva/sclera: Conjunctivae normal.  Neck:     Musculoskeletal: Neck supple.  Cardiovascular:     Rate and Rhythm: Regular rhythm. Tachycardia present.     Heart sounds: No murmur.  Pulmonary:     Effort: Pulmonary effort is normal. No respiratory distress.     Breath sounds: Normal breath sounds. No wheezing.  Abdominal:     General:  Bowel sounds are decreased. There is no distension.     Palpations: Abdomen is soft.     Tenderness: There is abdominal tenderness in the right upper quadrant, right lower quadrant and suprapubic area. There is right CVA tenderness. There is no left CVA tenderness, guarding or rebound. Negative signs include Murphy's sign.  Skin:    General: Skin is warm and dry.     Capillary Refill: Capillary refill takes less than 2 seconds.  Neurological:     Mental Status: She is alert. Mental status is at baseline.  Psychiatric:        Mood and Affect: Mood normal.        Behavior: Behavior normal.      UA: negative nitrites, neg LE, no blood, + glucose + ketones      Assessment & Plan:   Problem List Items Addressed This Visit      Endocrine   Type 2 diabetes mellitus (HCC)    Suspect it will be poorly control. Cost is a factor in medications due to no insurance. Will repeat to help aid PCP management      Relevant Orders   Hemoglobin A1c     Genitourinary   Kidney stone   Relevant  Orders   CT Abdomen Pelvis W Contrast     Other   Right lower quadrant pain    Patient believes she passed the stone this weekend and UA today w/o blood to indicate recurrent stone. Still with RLQ and Right CVA pain w/o signs of Urinary tract infection. Diabetes could be contributing to urinary symptoms. At this point, unclear what is causing her abdominal pain but as persisting will rule-out other causes. No fever so less likely to be appendicitis.       Relevant Orders   CBC with Differential   Comprehensive metabolic panel   CT Abdomen Pelvis W Contrast    Other Visit Diagnoses    Dysuria    -  Primary   Relevant Orders   POCT urinalysis dipstick (Completed)       Return if symptoms worsen or fail to improve.  Lesleigh Noe, MD

## 2019-02-16 NOTE — Telephone Encounter (Signed)
This has been addressed, scheduler has been reminded.

## 2019-02-28 ENCOUNTER — Ambulatory Visit
Admission: EM | Admit: 2019-02-28 | Discharge: 2019-02-28 | Disposition: A | Discharge status: Home / Self Care | Payer: Self-pay | Attending: Family Medicine | Admitting: Family Medicine

## 2019-02-28 ENCOUNTER — Other Ambulatory Visit: Payer: Self-pay

## 2019-02-28 ENCOUNTER — Ambulatory Visit (INDEPENDENT_AMBULATORY_CARE_PROVIDER_SITE_OTHER): Payer: Self-pay

## 2019-02-28 DIAGNOSIS — M79671 Pain in right foot: Secondary | ICD-10-CM

## 2019-02-28 DIAGNOSIS — S93601A Unspecified sprain of right foot, initial encounter: Secondary | ICD-10-CM

## 2019-02-28 DIAGNOSIS — W1840XA Slipping, tripping and stumbling without falling, unspecified, initial encounter: Secondary | ICD-10-CM

## 2019-02-28 MED ORDER — TRAMADOL HCL 50 MG PO TABS: 50.0000 mg | ORAL_TABLET | Freq: Three times a day (TID) | ORAL | 0 refills | Status: DC | PRN

## 2019-02-28 NOTE — ED Triage Notes (Signed)
Patient complains of right foot pain that started on Tuesday after she tripped. Patient states that she has continued to walk and run on her foot. Patient states that he foot has now swelled up and is painful. Worse after resting and then moving it again.

## 2019-02-28 NOTE — ED Provider Notes (Signed)
[facility] ____________________________________________  Time seen: Approximately 3:30 PM  I have reviewed the triage vital signs and the nursing notes.   HISTORY  Chief Complaint Foot Pain (right)  HPI Joan [name] is a 38 y.o. female presenting for evaluation of right foot pain present since this past Tuesday after injury.  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  States that she tripped and then caught her self.  States that she injured her foot doing this but unsure exactly how.  Denies any fall or landing on foot.  States she did have some swelling later that day but the swelling has continued and pain as well.  Has not allowed rest time.  Has continued remain active, walking as well as running.  Unresolved with Tylenol.  Allergic to NSAIDs.  Denies pain radiation or other injuries.  Reports right leg otherwise feeling normal.  No recent cough, fever, chest pain or shortness of breath.  Pleas Koch, NP: PCP   Past Medical History:  Diagnosis Date  . Asthma   . Auditory hallucinations    only after anesthesia  . Diabetes mellitus without complication (HCC)    diet controlled  . Diabetes mellitus, type II (HCC)    insulin, jardiance  . Dyspnea    with exertion  . Essential hypertension   . Essential hypertension 11/24/2017  . GERD (gastroesophageal reflux disease)    occasionally  . MDD (major depressive disorder)   . Miscarriage   . PTSD (post-traumatic stress disorder)     Patient Active Problem List   Diagnosis Date Noted  . Kidney stone 02/11/2019  . Asthma 11/03/2018  . Acute non-recurrent maxillary sinusitis 09/25/2018  . Recurrent sinusitis 05/20/2018  . History of placement of ear tubes 05/20/2018  . Fever and chills 05/08/2018  . Tachycardia 05/04/2018  . Missed period 04/29/2018  . Asthma exacerbation 04/10/2018  . Right lower quadrant pain 02/13/2018  . BV (bacterial  vaginosis) 02/13/2018  . Type 2 diabetes mellitus ([place]) 11/24/2017  . Essential hypertension 11/24/2017  . Hyperlipidemia 11/24/2017  . Major depressive disorder, recurrent, severe without psychotic features ([place])   . PTSD (post-traumatic stress disorder) 04/11/2015    Past Surgical History:  Procedure Laterality Date  . ABDOMINAL HYSTERECTOMY    . ADENOIDECTOMY    . CHOLECYSTECTOMY  2009  . CYSTOSCOPY N/A 07/31/2018   Procedure: CYSTOSCOPY;  Surgeon: Benjaman Kindler, MD;  Location: [facility];  Service: Gynecology;  Laterality: N/A;  . LAPAROSCOPIC BILATERAL SALPINGECTOMY Bilateral 07/31/2018   Procedure: LAPAROSCOPIC BILATERAL SALPINGECTOMY;  Surgeon: Benjaman Kindler, MD;  Location: [facility];  Service: Gynecology;  Laterality: Bilateral;  . LAPAROSCOPIC HYSTERECTOMY N/A 07/31/2018   Procedure: HYSTERECTOMY TOTAL LAPAROSCOPIC;  Surgeon: Benjaman Kindler, MD;  Location: [facility];  Service: Gynecology;  Laterality: N/A;  . LYSIS OF ADHESION N/A 07/31/2018   Procedure: LYSIS OF ADHESION;  Surgeon: Benjaman Kindler, MD;  Location: [facility];  Service: Gynecology;  Laterality: N/A;  . OVARY SURGERY Right    cyst removed a while ago  . TONSILLECTOMY    . tubes in ear       No current facility-administered medications for this encounter.   Current Outpatient Medications:  .  acetaminophen (TYLENOL) 500 MG tablet, Take 1,000 mg by mouth every 6 (six) hours as needed., Disp: , Rfl:  .  ADVAIR DISKUS 250-50 MCG/DOSE AEPB, INHALE 1 PUFF INTO THE LUNGS TWICE DAILY. RINSE MOUTH AFTER USE, Disp: 60  each, Rfl: 2 .  albuterol (VENTOLIN HFA) 108 (90 Base) MCG/ACT inhaler, INHALE 2 PUFFS BY MOUTH EVERY 6 HOURS AS NEEDED FOR SHORTNESS OF BREATH AND WHEEZING, Disp: 18 g, Rfl: 0 .  ARIPiprazole (ABILIFY) 5 MG tablet, TAKE 1 AND 1/2 TABLETS(7.5 MG) BY MOUTH DAILY, Disp: 45 tablet, Rfl: 0 .  atorvastatin (LIPITOR) 40 MG tablet, Take 1 tablet by mouth at bedtime for cholesterol. (Patient taking  differently: Take 40 mg by mouth at bedtime. Take 1 tablet by mouth at bedtime for cholesterol.), Disp: 90 tablet, Rfl: 3 .  blood glucose meter kit and supplies KIT, Dispense based on patient and insurance preference. Use up three times daily as directed. (FOR ICD-9 250.00, 250.01)., Disp: 1 each, Rfl: 0 .  citalopram (CELEXA) 20 MG tablet, TAKE 1 TABLET(20 MG) BY MOUTH DAILY, Disp: 90 tablet, Rfl: 1 .  doxepin (SINEQUAN) 10 MG capsule, TAKE 1 TO 2 CAPSULES(10 TO 20 MG) BY MOUTH AT BEDTIME FOR SLEEP, Disp: 60 capsule, Rfl: 0 .  Dulaglutide (TRULICITY) 4.40 NU/2.7OZ SOPN, Inject once weekly for diabetes., Disp: 2 mL, Rfl: 3 .  fluticasone (FLONASE) 50 MCG/ACT nasal spray, Place 2 sprays into the nose daily. , Disp: , Rfl:  .  hydrOXYzine (ATARAX/VISTARIL) 50 MG tablet, Take 50 mg by mouth at bedtime. , Disp: , Rfl: 0 .  insulin NPH-regular Human (70-30) 100 UNIT/ML injection, Inject 25 Units into the skin 2 (two) times daily., Disp: , Rfl:  .  Insulin Pen Needle (PEN NEEDLES) 31G X 6 MM MISC, Use with insulin as directed., Disp: 100 each, Rfl: 2 .  ipratropium-albuterol (DUONEB) 0.5-2.5 (3) MG/3ML SOLN, Take 3 mLs by nebulization every 6 (six) hours as needed., Disp: 360 mL, Rfl: 5 .  norethindrone (MICRONOR) 0.35 MG tablet, Take by mouth., Disp: , Rfl:  .  Spacer/Aero-Holding Chambers (AEROCHAMBER PLUS) inhaler, Use as instructed. Generic spacer if possible, Disp: 1 each, Rfl: 2 .  traMADol (ULTRAM) 50 MG tablet, Take 1 tablet (50 mg total) by mouth every 8 (eight) hours as needed (pain)., Disp: 8 tablet, Rfl: 0  Allergies Ibuprofen, Ciprofloxacin, Metformin and related, Penicillins, and Sulfa antibiotics  Family History  Problem Relation Age of Onset  . Asthma Mother   . Diabetes Mother   . Hyperlipidemia Mother   . Hypertension Mother   . Diabetes Father   . Hyperlipidemia Father   . Hypertension Father   . Diabetes Brother   . Depression Brother   . Alcohol abuse Brother   .  Depression Maternal Grandmother   . Stroke Paternal Grandmother   . Breast cancer Neg Hx     Social History Social History   Tobacco Use  . Smoking status: Never Smoker  . Smokeless tobacco: Never Used  Substance Use Topics  . Alcohol use: No  . Drug use: No    Review of Systems Constitutional: No fever ENT: No sore throat. Cardiovascular: Denies chest pain. Respiratory: Denies shortness of breath. Gastrointestinal: No abdominal pain.  No nausea, no vomiting.  Genitourinary: Negative for dysuria. Musculoskeletal: Positive right foot pain. Skin: Negative for rash.   ____________________________________________   PHYSICAL EXAM:  VITAL SIGNS: ED Triage Vitals  Enc Vitals Group     BP 02/28/19 1451 (!) 144/94     Pulse Rate 02/28/19 1451 97     Resp 02/28/19 1451 18     Temp 02/28/19 1451 98.6 F (37 C)     Temp Source 02/28/19 1451 Oral     SpO2 02/28/19  1451 100 %     Weight 02/28/19 1446 286 lb (129.7 kg)     Height 02/28/19 1446 _0  (1.676 m)     Head Circumference --      Peak Flow --      Pain Score 02/28/19 1446 10     Pain Loc --      Pain Edu? --      Excl. in [place]? --     Constitutional: Alert and oriented. Well appearing and in no acute distress. Eyes: Conjunctivae are normal.  ENT      Head: Normocephalic and atraumatic. Cardiovascular: Normal rate, regular rhythm. Grossly normal heart sounds.  Good peripheral circulation. Respiratory: Normal respiratory effort without tachypnea nor retractions. Breath sounds are clear and equal bilaterally. No wheezes, rales, rhonchi. Musculoskeletal: Steady gait.  Bilateral pedal pulses equal and easily palpated. Except: Right dorsal mid to lateral foot mild to moderate tenderness to palpation with mild swelling, no erythema, no break in skin, pain with plantar flexion and dorsiflexion, normal distal sensation and capillary refill, ankle nontender, right lower extremity otherwise nontender. Neurologic:  Normal  speech and language.Speech is normal. No gait instability.  Skin:  Skin is warm, dry and intact. No rash noted. Psychiatric: Mood and affect are normal. Speech and behavior are normal. Patient exhibits appropriate insight and judgment   ___________________________________________   LABS (all labs ordered are listed, but only abnormal results are displayed)  Labs Reviewed - No data to display ____________________________________________  RADIOLOGY  Dg Foot Complete Right  Result Date: 02/28/2019 CLINICAL DATA:  Pt tripped over chair 5 days ago but didn't fall. Pain in top of foot and pain radiates into ant ankle when she flexes or extends foot EXAM: RIGHT FOOT COMPLETE - 3+ VIEW COMPARISON:  None. FINDINGS: There is mild soft tissue swelling along the dorsum of the forefoot. No acute fracture or subluxation. No radiopaque foreign body or soft tissue gas. Small plantar and Achilles spurs are present. IMPRESSION: 1. Soft tissue swelling. 2. No evidence for acute osseous abnormality. Electronically Signed   By: Nolon Nations M.D.   On: 02/28/2019 15:12   ____________________________________________   PROCEDURES Procedures    INITIAL IMPRESSION / ASSESSMENT AND PLAN / ED COURSE  Pertinent labs & imaging results that were available during my care of the patient were reviewed by me and considered in my medical decision making (see chart for details).  Well-appearing patient.  No acute distress.  Right foot pain post mechanical injury.  Suspect sprain injury.  Right foot x-ray soft tissue swelling, no evidence for acute osseous abnormality as above per radiologist.  Postop shoe given for support.  Patient allergic to NSAIDs.  Over-the-counter Tylenol as needed.  Quantity 8 tramadol given as needed for breakthrough pain.  Work note given for tomorrow.  Rest ice and gradual increase activity.  Follow-up with podiatry as needed for continued pain.Discussed indication, risks and benefits of  medications with patient.  Discussed follow up with Primary care physician this week. Discussed follow up and return parameters including no resolution or any worsening concerns. Patient verbalized understanding and agreed to plan.    [place] controlled substance database reviewed, most recent controlled substances as below: 02/11/2019  2   02/11/2019  Oxycodone Hcl 5 MG Tablet  10.00 2 [facility]   [id number]   Wal ([id number])   0  37.50 MME  Private Pay   [place]  12/25/2018  2   12/25/2018  Tramadol Hcl 50 MG Tablet  8.00 2 [facility]   [id number]   Wal ([id number])   0  20.00 MME  Comm Ins        ____________________________________________   FINAL CLINICAL IMPRESSION(S) / ED DIAGNOSES  Final diagnoses:  Foot pain, right  Sprain of right foot, initial encounter     ED Discharge Orders         Ordered    traMADol (ULTRAM) 50 MG tablet  Every 8 hours PRN     02/28/19 1529           Note: This dictation was prepared with Dragon dictation along with smaller phrase technology. Any transcriptional errors that result from this process are unintentional.         Marylene Land, NP 02/28/19 1552

## 2019-02-28 NOTE — Discharge Instructions (Signed)
Take medication as prescribed. Rest. Drink plenty of fluids.  Tylenol over-the-counter.  Wear postoperative shoe.  Rest.  Follow-up in 1 week with podiatry if pain continues.  Follow up with your primary care physician this week as needed. Return to Urgent care for new or worsening concerns.

## 2019-03-01 ENCOUNTER — Other Ambulatory Visit: Payer: Self-pay

## 2019-03-01 ENCOUNTER — Ambulatory Visit
Admission: EM | Admit: 2019-03-01 | Discharge: 2019-03-01 | Disposition: A | Discharge status: Home / Self Care | Payer: Self-pay | Attending: Emergency Medicine | Admitting: Emergency Medicine

## 2019-03-01 DIAGNOSIS — M79671 Pain in right foot: Secondary | ICD-10-CM

## 2019-03-01 DIAGNOSIS — W1840XD Slipping, tripping and stumbling without falling, unspecified, subsequent encounter: Secondary | ICD-10-CM

## 2019-03-01 NOTE — ED Triage Notes (Signed)
Patient states that she has returned today due to the foot pain that she was evaluated for yesterday is worse. Patient states that she has been unable to walk due to pain and foot has throbbed.

## 2019-03-01 NOTE — Discharge Instructions (Signed)
Continue medications as prescribed.   Use ACE bandage to wrap foot, ankle and leg while awake.

## 2019-03-01 NOTE — ED Provider Notes (Signed)
[facility]    CSN: [id number] Arrival date & time: 03/01/19  1640      History   Chief Complaint Chief Complaint  Patient presents with  . Foot Pain    right    HPI Joan [name] is a 38 y.o. female presenting with consistent right foot pain. Pt was seen here yesterday for the same complaint and was given a post-op shoe and tramadol. Pt states she has no felt any relief with the above and pain is actually getting worse.   Past Medical History:  Diagnosis Date  . Asthma   . Auditory hallucinations    only after anesthesia  . Diabetes mellitus without complication (HCC)    diet controlled  . Diabetes mellitus, type II (HCC)    insulin, jardiance  . Dyspnea    with exertion  . Essential hypertension   . Essential hypertension 11/24/2017  . GERD (gastroesophageal reflux disease)    occasionally  . MDD (major depressive disorder)   . Miscarriage   . PTSD (post-traumatic stress disorder)     Patient Active Problem List   Diagnosis Date Noted  . Kidney stone 02/11/2019  . Asthma 11/03/2018  . Acute non-recurrent maxillary sinusitis 09/25/2018  . Recurrent sinusitis 05/20/2018  . History of placement of ear tubes 05/20/2018  . Fever and chills 05/08/2018  . Tachycardia 05/04/2018  . Missed period 04/29/2018  . Asthma exacerbation 04/10/2018  . Right lower quadrant pain 02/13/2018  . BV (bacterial vaginosis) 02/13/2018  . Type 2 diabetes mellitus ([place]) 11/24/2017  . Essential hypertension 11/24/2017  . Hyperlipidemia 11/24/2017  . Major depressive disorder, recurrent, severe without psychotic features ([place])   . PTSD (post-traumatic stress disorder) 04/11/2015    Past Surgical History:  Procedure Laterality Date  . ABDOMINAL HYSTERECTOMY    . ADENOIDECTOMY    . CHOLECYSTECTOMY  2009  . CYSTOSCOPY N/A 07/31/2018   Procedure: CYSTOSCOPY;  Surgeon: Benjaman Kindler, MD;  Location: [facility];  Service: Gynecology;  Laterality: N/A;  . LAPAROSCOPIC  BILATERAL SALPINGECTOMY Bilateral 07/31/2018   Procedure: LAPAROSCOPIC BILATERAL SALPINGECTOMY;  Surgeon: Benjaman Kindler, MD;  Location: [facility];  Service: Gynecology;  Laterality: Bilateral;  . LAPAROSCOPIC HYSTERECTOMY N/A 07/31/2018   Procedure: HYSTERECTOMY TOTAL LAPAROSCOPIC;  Surgeon: Benjaman Kindler, MD;  Location: [facility];  Service: Gynecology;  Laterality: N/A;  . LYSIS OF ADHESION N/A 07/31/2018   Procedure: LYSIS OF ADHESION;  Surgeon: Benjaman Kindler, MD;  Location: [facility];  Service: Gynecology;  Laterality: N/A;  . OVARY SURGERY Right    cyst removed a while ago  . TONSILLECTOMY    . tubes in ear      OB History   No obstetric history on file.      Home Medications    Prior to Admission medications   Medication Sig Start Date End Date Taking? Authorizing Provider  acetaminophen (TYLENOL) 500 MG tablet Take 1,000 mg by mouth every 6 (six) hours as needed.   Yes [provider]  ADVAIR DISKUS 250-50 MCG/DOSE AEPB INHALE 1 PUFF INTO THE LUNGS TWICE DAILY. RINSE MOUTH AFTER USE 08/24/18  Yes Laverle Hobby, MD  albuterol (VENTOLIN HFA) 108 (90 Base) MCG/ACT inhaler INHALE 2 PUFFS BY MOUTH EVERY 6 HOURS AS NEEDED FOR SHORTNESS OF BREATH AND WHEEZING 10/19/18  Yes Pleas Koch, NP  ARIPiprazole (ABILIFY) 5 MG tablet TAKE 1 AND 1/2 TABLETS(7.5 MG) BY MOUTH DAILY 08/14/18  Yes Eappen, Ria Clock, MD  atorvastatin (LIPITOR) 40 MG tablet Take 1 tablet by  mouth at bedtime for cholesterol. Patient taking differently: Take 40 mg by mouth at bedtime. Take 1 tablet by mouth at bedtime for cholesterol. 05/28/18  Yes Pleas Koch, NP  blood glucose meter kit and supplies KIT Dispense based on patient and insurance preference. Use up three times daily as directed. (FOR ICD-9 250.00, 250.01). 11/24/17  Yes Pleas Koch, NP  citalopram (CELEXA) 20 MG tablet TAKE 1 TABLET(20 MG) BY MOUTH DAILY 10/05/18  Yes Pleas Koch, NP  doxepin (SINEQUAN) 10 MG  capsule TAKE 1 TO 2 CAPSULES(10 TO 20 MG) BY MOUTH AT BEDTIME FOR SLEEP 08/25/18  Yes Eappen, Ria Clock, MD  Dulaglutide (TRULICITY) 3.38 SN/0.5LZ SOPN Inject once weekly for diabetes. 02/15/19  Yes Pleas Koch, NP  fluticasone (FLONASE) 50 MCG/ACT nasal spray Place 2 sprays into the nose daily.  03/10/18  Yes [provider]  hydrOXYzine (ATARAX/VISTARIL) 50 MG tablet Take 50 mg by mouth at bedtime.  11/28/17  Yes [provider]  insulin NPH-regular Human (70-30) 100 UNIT/ML injection Inject 25 Units into the skin 2 (two) times daily.   Yes [provider]  Insulin Pen Needle (PEN NEEDLES) 31G X 6 MM MISC Use with insulin as directed. 11/04/18  Yes Pleas Koch, NP  ipratropium-albuterol (DUONEB) 0.5-2.5 (3) MG/3ML SOLN Take 3 mLs by nebulization every 6 (six) hours as needed. 04/16/18  Yes Laverle Hobby, MD  norethindrone (MICRONOR) 0.35 MG tablet Take by mouth. 01/11/19  Yes [provider]  Spacer/Aero-Holding Chambers (AEROCHAMBER PLUS) inhaler Use as instructed. Generic spacer if possible 03/30/18  Yes Copland, Frederico Hamman, MD  traMADol (ULTRAM) 50 MG tablet Take 1 tablet (50 mg total) by mouth every 8 (eight) hours as needed (pain). 02/28/19  Yes Marylene Land, NP  gabapentin (NEURONTIN) 800 MG tablet Take 1 tablet (800 mg total) by mouth at bedtime for 14 days. Take nightly for 3 days, then up to 14 days as needed 07/31/18 02/28/19  Benjaman Kindler, MD  losartan (COZAAR) 100 MG tablet Take 1 tablet by mouth once daily for blood pressure. Patient taking differently: Take 100 mg by mouth daily. Take 1 tablet by mouth once daily for blood pressure. 12/18/17 02/28/19  Pleas Koch, NP  montelukast (SINGULAIR) 10 MG tablet TAKE 1 TABLET(10 MG) BY MOUTH AT BEDTIME 08/14/18 02/28/19  Laverle Hobby, MD    Family History Family History  Problem Relation Age of Onset  . Asthma Mother   . Diabetes Mother   . Hyperlipidemia Mother   . Hypertension  Mother   . Diabetes Father   . Hyperlipidemia Father   . Hypertension Father   . Diabetes Brother   . Depression Brother   . Alcohol abuse Brother   . Depression Maternal Grandmother   . Stroke Paternal Grandmother   . Breast cancer Neg Hx     Social History Social History   Tobacco Use  . Smoking status: Never Smoker  . Smokeless tobacco: Never Used  Substance Use Topics  . Alcohol use: No  . Drug use: No     Allergies   Ibuprofen, Ciprofloxacin, Metformin and related, Penicillins, and Sulfa antibiotics   Review of Systems Review of Systems  Musculoskeletal: Positive for arthralgias and myalgias.  Skin: Negative for color change.    Physical Exam Triage Vital Signs ED Triage Vitals  Enc Vitals Group     BP 03/01/19 1701 (!) 139/99     Pulse Rate 03/01/19 1701 (!) 103     Resp 03/01/19 1701  18     Temp 03/01/19 1701 98.7 F (37.1 C)     Temp Source 03/01/19 1701 Oral     SpO2 03/01/19 1701 100 %     Weight 03/01/19 1659 284 lb 6.3 oz (129 kg)     Height 03/01/19 1659 _0  (1.676 m)     Head Circumference --      Peak Flow --      Pain Score 03/01/19 1659 9     Pain Loc --      Pain Edu? --      Excl. in [place]? --    No data found.  Updated Vital Signs BP (!) 139/99 (BP Location: Left Arm)   Pulse (!) 103   Temp 98.7 F (37.1 C) (Oral)   Resp 18   Ht _1  (1.676 m)   Wt 284 lb 6.3 oz (129 kg)   LMP 07/24/2018 (Exact Date) Comment: surgery 07/31/2018  SpO2 100%   BMI 45.90 kg/m   Visual Acuity Right Eye Distance:   Left Eye Distance:   Bilateral Distance:    Right Eye Near:   Left Eye Near:    Bilateral Near:     Physical Exam   UC Treatments / Results  Labs (all labs ordered are listed, but only abnormal results are displayed) Labs Reviewed - No data to display  EKG   Radiology Dg Foot Complete Right  Result Date: 02/28/2019 CLINICAL DATA:  Pt tripped over chair 5 days ago but didn't fall. Pain in top of foot and pain radiates  into ant ankle when she flexes or extends foot EXAM: RIGHT FOOT COMPLETE - 3+ VIEW COMPARISON:  None. FINDINGS: There is mild soft tissue swelling along the dorsum of the forefoot. No acute fracture or subluxation. No radiopaque foreign body or soft tissue gas. Small plantar and Achilles spurs are present. IMPRESSION: 1. Soft tissue swelling. 2. No evidence for acute osseous abnormality. Electronically Signed   By: Nolon Nations M.D.   On: 02/28/2019 15:12    Procedures Procedures (including critical care time)  Medications Ordered in UC Medications - No data to display  Initial Impression / Assessment and Plan / UC Course  I have reviewed the triage vital signs and the nursing notes.  Pertinent labs & imaging results that were available during my care of the patient were reviewed by me and considered in my medical decision making (see chart for details).     Pt presents with right foot pain, diagnosed with the same. ACE compression bandage applied to foot, ankle and lower leg to aid in compression. Topical treatment suggested since pt is unable to take oral NSAIDs or steroids. Pt encouraged to follow-up with PCP. May need further imaging. All questions answered and all concerns addressed.   Final Clinical Impressions(s) / UC Diagnoses   Final diagnoses:  Foot pain, right     Discharge Instructions     Continue medications as prescribed.   Use ACE bandage to wrap foot, ankle and leg while awake.    ED Prescriptions    None     Controlled Substance Prescriptions [place] Controlled Substance Registry consulted? Not Applicable    Gertie Baron, DNP, NP-c    Gertie Baron, NP 03/01/19 [phone]

## 2019-03-01 NOTE — ED Notes (Signed)
Ace wrap applied to right lower extremity for support.

## 2019-03-15 ENCOUNTER — Ambulatory Visit: Payer: Self-pay | Admitting: Dietician

## 2019-03-29 NOTE — Progress Notes (Signed)
This encounter was created in error - please disregard.

## 2019-04-04 ENCOUNTER — Other Ambulatory Visit: Payer: Self-pay

## 2019-04-04 ENCOUNTER — Ambulatory Visit
Admission: EM | Admit: 2019-04-04 | Discharge: 2019-04-04 | Disposition: A | Discharge status: Home / Self Care | Payer: Self-pay | Attending: Emergency Medicine | Admitting: Emergency Medicine

## 2019-04-04 ENCOUNTER — Encounter: Payer: Self-pay | Admitting: Emergency Medicine

## 2019-04-04 DIAGNOSIS — H9203 Otalgia, bilateral: Secondary | ICD-10-CM

## 2019-04-04 DIAGNOSIS — J0140 Acute pansinusitis, unspecified: Secondary | ICD-10-CM

## 2019-04-04 MED ORDER — EPINEPHRINE 0.3 MG/0.3ML IJ SOAJ: 0.3000 mg | Freq: Once | INTRAMUSCULAR | 0 refills | Status: AC

## 2019-04-04 MED ORDER — AMOXICILLIN-POT CLAVULANATE 875-125 MG PO TABS: 1.0000 | ORAL_TABLET | Freq: Two times a day (BID) | ORAL | 0 refills | Status: DC

## 2019-04-04 MED ORDER — FLUTICASONE PROPIONATE 50 MCG/ACT NA SUSP: 2.0000 | Freq: Every day | NASAL | 0 refills | Status: DC

## 2019-04-04 NOTE — ED Triage Notes (Signed)
Patient in today c/o ear pain, headache and body aches x 3 days. Patient denies fever, but has been having chills. Patient finished antibiotic ~2 weeks ago for ear infection.

## 2019-04-04 NOTE — ED Provider Notes (Signed)
HPI  SUBJECTIVE:  Joan [name] is a 38 y.o. female who presents with 3 days of fatigue, body aches, chills, bilateral sharp, constant ear pain, maxillary sinus pain and pressure, states that her upper gum line hurts.  She denies fevers, nasal congestion, nasal discharge, rhinorrhea.  No otorrhea, change in her hearing.  Her ear pain is not associated with chewing, yawning, she denies grinding her teeth at night.  She uses a Q-tip "in the outside of my ears only".  No recent swimming.  No headache, sore throat, loss of sense of smell or taste.  No coughing, shortness of breath, nausea, vomiting, abdominal pain, diarrhea.  No known exposure to COVID.  She was on a Z-Pak for otitis media 2 weeks ago and states that her ear pain never completely resolved.  She tried Tylenol, her last dose was yesterday.  No alleviating factors.  This pain and pressure is worse with bending forward.  She states that she feels as if "I have the flu".  No tick bite.  She has a past medical history of frequent sinusitis, tachycardia, recurrent otitis media.  She is status post tympanoplasty tubes last year.  She also has diabetes, hypertension.  States that she forgot her blood pressure pill last night.  PMD: Dr. Allie Bossier at [facility].    Past Medical History:  Diagnosis Date  . Asthma   . Auditory hallucinations    only after anesthesia  . Diabetes mellitus without complication (HCC)    diet controlled  . Diabetes mellitus, type II (HCC)    insulin, jardiance  . Dyspnea    with exertion  . Essential hypertension   . Essential hypertension 11/24/2017  . GERD (gastroesophageal reflux disease)    occasionally  . MDD (major depressive disorder)   . Miscarriage   . PTSD (post-traumatic stress disorder)     Past Surgical History:  Procedure Laterality Date  . ABDOMINAL HYSTERECTOMY    . ADENOIDECTOMY    . CHOLECYSTECTOMY  2009  . CYSTOSCOPY N/A 07/31/2018   Procedure: CYSTOSCOPY;  Surgeon: Benjaman Kindler,  MD;  Location: [facility];  Service: Gynecology;  Laterality: N/A;  . LAPAROSCOPIC BILATERAL SALPINGECTOMY Bilateral 07/31/2018   Procedure: LAPAROSCOPIC BILATERAL SALPINGECTOMY;  Surgeon: Benjaman Kindler, MD;  Location: [facility];  Service: Gynecology;  Laterality: Bilateral;  . LAPAROSCOPIC HYSTERECTOMY N/A 07/31/2018   Procedure: HYSTERECTOMY TOTAL LAPAROSCOPIC;  Surgeon: Benjaman Kindler, MD;  Location: [facility];  Service: Gynecology;  Laterality: N/A;  . LYSIS OF ADHESION N/A 07/31/2018   Procedure: LYSIS OF ADHESION;  Surgeon: Benjaman Kindler, MD;  Location: [facility];  Service: Gynecology;  Laterality: N/A;  . OVARY SURGERY Right    cyst removed a while ago  . TONSILLECTOMY    . tubes in ear      Family History  Problem Relation Age of Onset  . Asthma Mother   . Diabetes Mother   . Hyperlipidemia Mother   . Hypertension Mother   . Diabetes Father   . Hyperlipidemia Father   . Hypertension Father   . Diabetes Brother   . Depression Brother   . Alcohol abuse Brother   . Depression Maternal Grandmother   . Stroke Paternal Grandmother   . Breast cancer Neg Hx     Social History   Tobacco Use  . Smoking status: Never Smoker  . Smokeless tobacco: Never Used  Substance Use Topics  . Alcohol use: No  . Drug use: No    No current facility-administered medications  for this encounter.   Current Outpatient Medications:  .  acetaminophen (TYLENOL) 500 MG tablet, Take 1,000 mg by mouth every 6 (six) hours as needed., Disp: , Rfl:  .  albuterol (VENTOLIN HFA) 108 (90 Base) MCG/ACT inhaler, INHALE 2 PUFFS BY MOUTH EVERY 6 HOURS AS NEEDED FOR SHORTNESS OF BREATH AND WHEEZING, Disp: 18 g, Rfl: 0 .  ARIPiprazole (ABILIFY) 5 MG tablet, TAKE 1 AND 1/2 TABLETS(7.5 MG) BY MOUTH DAILY, Disp: 45 tablet, Rfl: 0 .  doxepin (SINEQUAN) 10 MG capsule, TAKE 1 TO 2 CAPSULES(10 TO 20 MG) BY MOUTH AT BEDTIME FOR SLEEP, Disp: 60 capsule, Rfl: 0 .  Dulaglutide (TRULICITY) 6.04 VW/0.9WJ SOPN, Inject  once weekly for diabetes., Disp: 2 mL, Rfl: 3 .  insulin NPH-regular Human (70-30) 100 UNIT/ML injection, Inject 25 Units into the skin 2 (two) times daily., Disp: , Rfl:  .  ipratropium-albuterol (DUONEB) 0.5-2.5 (3) MG/3ML SOLN, Take 3 mLs by nebulization every 6 (six) hours as needed., Disp: 360 mL, Rfl: 5 .  norethindrone (MICRONOR) 0.35 MG tablet, Take by mouth., Disp: , Rfl:  .  ADVAIR DISKUS 250-50 MCG/DOSE AEPB, INHALE 1 PUFF INTO THE LUNGS TWICE DAILY. RINSE MOUTH AFTER USE, Disp: 60 each, Rfl: 2 .  amoxicillin-clavulanate (AUGMENTIN) 875-125 MG tablet, Take 1 tablet by mouth 2 (two) times daily. X 7 days, Disp: 14 tablet, Rfl: 0 .  atorvastatin (LIPITOR) 40 MG tablet, Take 1 tablet by mouth at bedtime for cholesterol. (Patient taking differently: Take 40 mg by mouth at bedtime. Take 1 tablet by mouth at bedtime for cholesterol.), Disp: 90 tablet, Rfl: 3 .  blood glucose meter kit and supplies KIT, Dispense based on patient and insurance preference. Use up three times daily as directed. (FOR ICD-9 250.00, 250.01)., Disp: 1 each, Rfl: 0 .  citalopram (CELEXA) 20 MG tablet, TAKE 1 TABLET(20 MG) BY MOUTH DAILY, Disp: 90 tablet, Rfl: 1 .  EPINEPHrine 0.3 mg/0.3 mL IJ SOAJ injection, Inject 0.3 mLs (0.3 mg total) into the muscle once for 1 dose., Disp: 1 each, Rfl: 0 .  fluticasone (FLONASE) 50 MCG/ACT nasal spray, Place 2 sprays into both nostrils daily., Disp: 16 g, Rfl: 0 .  hydrOXYzine (ATARAX/VISTARIL) 50 MG tablet, Take 50 mg by mouth at bedtime. , Disp: , Rfl: 0 .  Insulin Pen Needle (PEN NEEDLES) 31G X 6 MM MISC, Use with insulin as directed., Disp: 100 each, Rfl: 2 .  Spacer/Aero-Holding Chambers (AEROCHAMBER PLUS) inhaler, Use as instructed. Generic spacer if possible, Disp: 1 each, Rfl: 2 .  traMADol (ULTRAM) 50 MG tablet, Take 1 tablet (50 mg total) by mouth every 8 (eight) hours as needed (pain)., Disp: 8 tablet, Rfl: 0  Allergies  Allergen Reactions  . Ibuprofen Swelling     Facial   . Ciprofloxacin Hives and Rash  . Metformin And Related Other (See Comments)    Elevated Lactic Acid  . Penicillins Hives and Rash    Has patient had a PCN reaction causing immediate rash, facial/tongue/throat swelling, SOB or lightheadedness with hypotension: No Has patient had a PCN reaction causing severe rash involving mucus membranes or skin necrosis: No Has patient had a PCN reaction that required hospitalization: No Has patient had a PCN reaction occurring within the last 10 years: No If all of the above answers are "NO", then may proceed with Cephalosporin use. THE PATIENT IS ABLE TO TOLERATE CEPHALOSPORINS WITHOUT DIFFIC  . Sulfa Antibiotics Hives and Rash     ROS  As noted in  HPI.   Physical Exam  BP (!) 151/103 (BP Location: Left Arm)   Pulse (!) 125   Temp 98.9 F (37.2 C) (Oral)   Resp 18   Ht '5\' 6"'  (6.962 m)   Wt 126.6 kg   LMP 07/24/2018 (Exact Date) Comment: surgery 07/31/2018  SpO2 100%   BMI 45.03 kg/m   Constitutional: Well developed, well nourished, no acute distress Eyes:  EOMI, conjunctiva normal bilaterally HENT: Normocephalic, atraumatic,mucus membranes moist.  Bilateral external ears normal.  Bilateral external ear canals normal.  No pain with traction on pinna or palpation of tragus or palpation of mastoid bilaterally.  TMs normal bilaterally.  No tenderness or crepitus over the TMJ.  Mild nasal congestion.  Erythematous, but not swollen turbinates.  Positive maxillary and frontal sinus tenderness. Neck: No appreciable cervical lymphadenopathy Respiratory: Normal inspiratory effort, lungs clear bilaterally, good air movement Cardiovascular: Regular tachycardia, no murmurs rubs or gallops. GI: nondistended skin: No rash, skin intact Musculoskeletal: no deformities Neurologic: Alert & oriented x 3, no focal neuro deficits Psychiatric: Speech and behavior appropriate   ED Course   Medications - No data to display  Orders Placed This  Encounter  Procedures  . Novel Coronavirus, NAA (hospital order; send-out to ref lab)    Standing Status:   Standing    Number of Occurrences:   1    Order Specific Question:   Is this test for diagnosis or screening    Answer:   Diagnosis of ill patient    Order Specific Question:   Symptomatic for COVID-19 as defined by CDC    Answer:   Yes    Order Specific Question:   Date of Symptom Onset    Answer:   04/01/2019    Order Specific Question:   Hospitalized for COVID-19    Answer:   No    Order Specific Question:   Admitted to ICU for COVID-19    Answer:   No    Order Specific Question:   Previously tested for COVID-19    Answer:   Yes    Order Specific Question:   Resident in a congregate (group) care setting    Answer:   No    Order Specific Question:   Employed in healthcare setting    Answer:   No    Order Specific Question:   Pregnant    Answer:   No    No results found for this or any previous visit (from the past 24 hour(s)). No results found.  ED Clinical Impression  1. Acute non-recurrent pansinusitis   2. Otalgia of both ears      ED Assessment/Plan  Suspected sinusitis.  She is reporting severe symptoms, so sending home with Augmentin which will also cover any remaining ear pathogens although she does not have an otitis media on exam.  States that she has had amoxicillin and Augmentin in the past and has tolerated it well before.  Reports remote history of hives with penicillins only.  After discussing the risks and benefits, she is willing to try it again today.  Will send home with EpiPen.  She is to take Benadryl 50 mg, use the EpiPen and go immediately to the ER for any signs of anaphylaxis.  Also sent off COVID testing.  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  She is to do saline nasal irrigation with a Milta Deiters med rinse and distilled water as often as she wants, Flonase.  1 g of  Tylenol 3 or 4 times a day as needed for headache, pain, body aches,  fevers.  Blood pressure noted.  She forgot her blood pressure pill last night.  She has no other symptoms.  Heart rate noted, she states that she has severe doctor anxiety and is in pain so thinks that this is why it is elevated.  Follow-up with PMD in 72 hours if not better.  Discussed labs, imaging, MDM, treatment plan, and plan for follow-up with patient. patient agrees with plan.   Meds ordered this encounter  Medications  . amoxicillin-clavulanate (AUGMENTIN) 875-125 MG tablet    Sig: Take 1 tablet by mouth 2 (two) times daily. X 7 days    Dispense:  14 tablet    Refill:  0  . fluticasone (FLONASE) 50 MCG/ACT nasal spray    Sig: Place 2 sprays into both nostrils daily.    Dispense:  16 g    Refill:  0  . EPINEPHrine 0.3 mg/0.3 mL IJ SOAJ injection    Sig: Inject 0.3 mLs (0.3 mg total) into the muscle once for 1 dose.    Dispense:  1 each    Refill:  0    *This clinic note was created using Lobbyist. Therefore, there may be occasional mistakes despite careful proofreading.   ?    Melynda Ripple, MD 04/04/19 1728

## 2019-04-04 NOTE — Discharge Instructions (Signed)
Take the medication as written.  Return to the ER if you get worse, have a fever >100.4, or for any concerns. You may take 6 1 gram of tylenol up to 3-4 times a day as needed for pain. T Most sinus infections are viral and do not need antibiotics unless you have a high fever, have had this for 10 days, or you get better and then get sick again. Use a NeilMed sinus rinse with distilled water as often as you want to to reduce nasal congestion. Follow the directions on the box.  Flonase.  Try the Augmentin, this is to treat a sinus infection and any residual ear infection.  If you start to have lip, tongue swelling, difficulty breathing, sensation of throat swelling shut, recurrence of your hives, then use the EpiPen, take 50 mg of Benadryl and go to the emergency department immediately.  Go to www.goodrx.com to look up your medications. This will give you a list of where you can find your prescriptions at the most affordable prices. Or you can ask the pharmacist what the cash price is. This is frequently cheaper than going through insurance.

## 2019-04-05 ENCOUNTER — Ambulatory Visit: Payer: Self-pay | Admitting: Dietician

## 2019-04-05 LAB — NOVEL CORONAVIRUS, NAA (HOSP ORDER, SEND-OUT TO REF LAB; TAT 18-24 HRS): SARS-CoV-2, NAA: NOT DETECTED

## 2019-04-22 ENCOUNTER — Ambulatory Visit: Payer: Self-pay | Admitting: Primary Care

## 2019-04-27 ENCOUNTER — Ambulatory Visit (INDEPENDENT_AMBULATORY_CARE_PROVIDER_SITE_OTHER): Payer: Self-pay | Admitting: Primary Care

## 2019-04-27 ENCOUNTER — Other Ambulatory Visit: Payer: Self-pay

## 2019-04-27 ENCOUNTER — Encounter: Payer: Self-pay | Admitting: Primary Care

## 2019-04-27 DIAGNOSIS — I10 Essential (primary) hypertension: Secondary | ICD-10-CM

## 2019-04-27 DIAGNOSIS — E119 Type 2 diabetes mellitus without complications: Secondary | ICD-10-CM

## 2019-04-27 DIAGNOSIS — R000 Tachycardia, unspecified: Secondary | ICD-10-CM

## 2019-04-27 DIAGNOSIS — Z794 Long term (current) use of insulin: Secondary | ICD-10-CM

## 2019-04-27 LAB — POC INFLUENZA A&B (BINAX/QUICKVUE)
Influenza A, POC: NEGATIVE
Influenza B, POC: NEGATIVE

## 2019-04-27 MED ORDER — LOSARTAN POTASSIUM 100 MG PO TABS: 100.0000 mg | ORAL_TABLET | Freq: Every day | ORAL | 3 refills | Status: DC

## 2019-04-27 NOTE — Patient Instructions (Signed)
Resume losartan 100 mg daily for blood pressure.  Start monitoring your blood pressure daily, around the same time of day, for the next 2-3 weeks.  Ensure that you have rested for 30 minutes prior to checking your blood pressure. Record your readings and send them via My Chart in two weeks. Also send me your HR readings.  Continue 70/30 insulin 50 units twice daily for now. Continue Trulicity.  Schedule a visit with me as soon as possible in November as discussed.  It was a pleasure to see you today!

## 2019-04-27 NOTE — Assessment & Plan Note (Signed)
Sounds to be uncontrolled. Continue 70/30 50 units BID for now with close monitoring of glucose levels.  Did better on Lantus and will be getting insurance in November this year. Plan on switching to Lantus when able.  Declines foot exam. Pneumonia vaccination UTD. Resumed ARB. Lipid panel due next visit.  Follow up in November 2020.
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
## 2019-04-27 NOTE — Addendum Note (Signed)
Addended by: Jacqualin Combes on: 04/27/2019 08:19 AM   Modules accepted: Orders

## 2019-04-27 NOTE — Assessment & Plan Note (Signed)
Consider adding metoprolol to regimen once she resumes losartan. She will send BP and HR readings in 2 weeks via My Chart.

## 2019-04-27 NOTE — Assessment & Plan Note (Signed)
Above goal in the office today, has been off of losartan for 2 weeks. Will start with a refill today. Consider addition of metoprolol succinate for HR and additional BP reduction in two weeks.  She does not have insurance, will be getting in November 2020 and plans on returning to our office at that time. She will send BP readings in two weeks via My Chart.

## 2019-05-03 ENCOUNTER — Encounter: Payer: Self-pay | Admitting: Emergency Medicine

## 2019-05-03 ENCOUNTER — Ambulatory Visit
Admission: EM | Admit: 2019-05-03 | Discharge: 2019-05-03 | Disposition: A | Discharge status: Home / Self Care | Payer: Self-pay | Attending: Internal Medicine | Admitting: Internal Medicine

## 2019-05-03 ENCOUNTER — Other Ambulatory Visit: Payer: Self-pay

## 2019-05-03 DIAGNOSIS — R05 Cough: Secondary | ICD-10-CM

## 2019-05-03 DIAGNOSIS — J111 Influenza due to unidentified influenza virus with other respiratory manifestations: Secondary | ICD-10-CM

## 2019-05-03 DIAGNOSIS — R69 Illness, unspecified: Secondary | ICD-10-CM

## 2019-05-03 DIAGNOSIS — M791 Myalgia, unspecified site: Secondary | ICD-10-CM

## 2019-05-03 LAB — RAPID INFLUENZA A&B ANTIGENS
Influenza A (ARMC): NEGATIVE
Influenza B (ARMC): NEGATIVE

## 2019-05-03 LAB — RAPID STREP SCREEN (MED CTR MEBANE ONLY): Streptococcus, Group A Screen (Direct): NEGATIVE

## 2019-05-03 MED ORDER — ACETAMINOPHEN 500 MG PO TABS: 500.0000 mg | ORAL_TABLET | Freq: Four times a day (QID) | ORAL | 0 refills | Status: DC | PRN

## 2019-05-03 MED ORDER — ACETAMINOPHEN 500 MG PO TABS: 1000.0000 mg | ORAL_TABLET | Freq: Four times a day (QID) | ORAL | Status: DC | PRN

## 2019-05-03 NOTE — ED Triage Notes (Signed)
Pt c/o body aches, chills, sore throat, bilateral ear pain, subjective fever. Started yesterday. Pt states that her husband tested positive for flu about a week ago.

## 2019-05-03 NOTE — ED Provider Notes (Signed)
[facility]    CSN: [id number] Arrival date & time: 05/03/19  0809      History   Chief Complaint Chief Complaint  Patient presents with  . Generalized Body Aches    HPI Joan [name] is a 38 y.o. female with history of asthma-controlled, hypertension currently on medications comes to urgent care with complaints of generalized body aches .  Symptoms started yesterday and is gotten progressively worse.  Patient has a cough which is nonproductive.  She denies any shortness of breath.  No ear fullness or ringing in the ears.  Symptoms started insidiously and is gotten persistent and severe.  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  No nausea or vomiting.  No diarrhea.  Patient has no dizziness or near syncope.Marland Kitchen   HPI  Past Medical History:  Diagnosis Date  . Asthma   . Auditory hallucinations    only after anesthesia  . Diabetes mellitus without complication (HCC)    diet controlled  . Diabetes mellitus, type II (HCC)    insulin, jardiance  . Dyspnea    with exertion  . Essential hypertension   . Essential hypertension 11/24/2017  . GERD (gastroesophageal reflux disease)    occasionally  . MDD (major depressive disorder)   . Miscarriage   . PTSD (post-traumatic stress disorder)     Patient Active Problem List   Diagnosis Date Noted  . Kidney stone 02/11/2019  . Asthma 11/03/2018  . Acute non-recurrent maxillary sinusitis 09/25/2018  . Recurrent sinusitis 05/20/2018  . History of placement of ear tubes 05/20/2018  . Fever and chills 05/08/2018  . Tachycardia 05/04/2018  . Missed period 04/29/2018  . Asthma exacerbation 04/10/2018  . Right lower quadrant pain 02/13/2018  . BV (bacterial vaginosis) 02/13/2018  . Type 2 diabetes mellitus ([place]) 11/24/2017  . Essential hypertension 11/24/2017  . Hyperlipidemia 11/24/2017  . Major depressive disorder, recurrent, severe without psychotic features ([place])   . PTSD (post-traumatic stress disorder) 04/11/2015    Past Surgical History:  Procedure Laterality Date  . ABDOMINAL HYSTERECTOMY    . ADENOIDECTOMY    . CHOLECYSTECTOMY  2009  . CYSTOSCOPY N/A 07/31/2018   Procedure: CYSTOSCOPY;  Surgeon: Benjaman Kindler, MD;  Location: [facility];  Service: Gynecology;  Laterality: N/A;  . LAPAROSCOPIC BILATERAL SALPINGECTOMY Bilateral 07/31/2018   Procedure: LAPAROSCOPIC BILATERAL SALPINGECTOMY;  Surgeon: Benjaman Kindler, MD;  Location: [facility];  Service: Gynecology;  Laterality: Bilateral;  . LAPAROSCOPIC HYSTERECTOMY N/A 07/31/2018   Procedure: HYSTERECTOMY TOTAL LAPAROSCOPIC;  Surgeon: Benjaman Kindler, MD;  Location: [facility];  Service: Gynecology;  Laterality: N/A;  . LYSIS OF ADHESION N/A 07/31/2018   Procedure: LYSIS OF ADHESION;  Surgeon: Benjaman Kindler, MD;  Location: [facility];  Service: Gynecology;  Laterality: N/A;  . OVARY SURGERY Right    cyst removed a while ago  . TONSILLECTOMY    . tubes in ear      OB History   No obstetric history on file.      Home Medications    Prior to Admission medications   Medication Sig Start Date End Date Taking? Authorizing Provider  ADVAIR DISKUS 250-50 MCG/DOSE AEPB INHALE 1 PUFF INTO THE LUNGS TWICE DAILY. RINSE MOUTH AFTER USE 08/24/18  Yes Laverle Hobby, MD  albuterol (VENTOLIN HFA) 108 (90 Base) MCG/ACT inhaler INHALE 2 PUFFS BY MOUTH EVERY 6 HOURS AS NEEDED FOR SHORTNESS OF BREATH AND WHEEZING 10/19/18  Yes Pleas Koch, NP  ARIPiprazole (ABILIFY) 5 MG tablet TAKE 1 AND  1/2 TABLETS(7.5 MG) BY MOUTH DAILY 08/14/18  Yes Eappen, Ria Clock, MD  atorvastatin (LIPITOR) 40 MG tablet Take 1 tablet by mouth at bedtime for cholesterol. Patient taking differently: Take 40 mg by mouth at bedtime. Take 1 tablet by mouth at bedtime for cholesterol. 05/28/18  Yes Pleas Koch, NP  blood glucose meter kit and supplies KIT Dispense based on patient and insurance preference. Use up three times daily as directed. (FOR ICD-9 250.00, 250.01).  11/24/17  Yes Pleas Koch, NP  citalopram (CELEXA) 20 MG tablet TAKE 1 TABLET(20 MG) BY MOUTH DAILY 10/05/18  Yes Pleas Koch, NP  doxepin (SINEQUAN) 10 MG capsule TAKE 1 TO 2 CAPSULES(10 TO 20 MG) BY MOUTH AT BEDTIME FOR SLEEP 08/25/18  Yes Eappen, Ria Clock, MD  Dulaglutide (TRULICITY) 5.68 LE/7.5TZ SOPN Inject once weekly for diabetes. 02/15/19  Yes Pleas Koch, NP  hydrOXYzine (ATARAX/VISTARIL) 50 MG tablet Take 50 mg by mouth at bedtime.  11/28/17  Yes [provider]  insulin NPH-regular Human (70-30) 100 UNIT/ML injection Inject 25 Units into the skin 2 (two) times daily.   Yes [provider]  Insulin Pen Needle (PEN NEEDLES) 31G X 6 MM MISC Use with insulin as directed. 11/04/18  Yes Pleas Koch, NP  ipratropium-albuterol (DUONEB) 0.5-2.5 (3) MG/3ML SOLN Take 3 mLs by nebulization every 6 (six) hours as needed. 04/16/18  Yes Laverle Hobby, MD  losartan (COZAAR) 100 MG tablet Take 1 tablet (100 mg total) by mouth daily. 04/27/19  Yes Pleas Koch, NP  norethindrone (MICRONOR) 0.35 MG tablet Take by mouth. 01/11/19  Yes [provider]  Spacer/Aero-Holding Chambers (AEROCHAMBER PLUS) inhaler Use as instructed. Generic spacer if possible 03/30/18  Yes Copland, Frederico Hamman, MD  acetaminophen (TYLENOL) 500 MG tablet Take 1 tablet (500 mg total) by mouth every 6 (six) hours as needed. 05/03/19   [name], Myrene Galas, MD  acetaminophen (TYLENOL) 500 MG tablet Take 2 tablets (1,000 mg total) by mouth every 6 (six) hours as needed. 05/03/19   [name], Myrene Galas, MD  gabapentin (NEURONTIN) 800 MG tablet Take 1 tablet (800 mg total) by mouth at bedtime for 14 days. Take nightly for 3 days, then up to 14 days as needed 07/31/18 02/28/19  Benjaman Kindler, MD  montelukast (SINGULAIR) 10 MG tablet TAKE 1 TABLET(10 MG) BY MOUTH AT BEDTIME 08/14/18 02/28/19  Laverle Hobby, MD    Family History Family History  Problem Relation Age of Onset  . Asthma Mother    . Diabetes Mother   . Hyperlipidemia Mother   . Hypertension Mother   . Diabetes Father   . Hyperlipidemia Father   . Hypertension Father   . Diabetes Brother   . Depression Brother   . Alcohol abuse Brother   . Depression Maternal Grandmother   . Stroke Paternal Grandmother   . Breast cancer Neg Hx     Social History Social History   Tobacco Use  . Smoking status: Never Smoker  . Smokeless tobacco: Never Used  Substance Use Topics  . Alcohol use: No  . Drug use: No     Allergies   Ibuprofen, Ciprofloxacin, Metformin and related, Penicillins, and Sulfa antibiotics   Review of Systems Review of Systems  Constitutional: Positive for activity change, chills, fatigue and fever.  HENT: Positive for ear pain and sore throat. Negative for congestion, ear discharge, postnasal drip, rhinorrhea, sinus pressure and sinus pain.   Respiratory: Positive for cough. Negative for chest tightness, shortness of breath and  wheezing.   Cardiovascular: Negative for chest pain and palpitations.  Gastrointestinal: Positive for nausea. Negative for abdominal distention, abdominal pain, diarrhea and vomiting.  Genitourinary: Negative.   Psychiatric/Behavioral: Negative.      Physical Exam Triage Vital Signs ED Triage Vitals  Enc Vitals Group     BP 05/03/19 0825 131/89     Pulse Rate 05/03/19 0825 93     Resp 05/03/19 0825 18     Temp 05/03/19 0825 98.2 F (36.8 C)     Temp Source 05/03/19 0825 Oral     SpO2 05/03/19 0825 99 %     Weight 05/03/19 0820 289 lb (131.1 kg)     Height 05/03/19 0820 5' 6" (1.676 m)     Head Circumference --      Peak Flow --      Pain Score 05/03/19 0820 8     Pain Loc --      Pain Edu? --      Excl. in [place]? --    No data found.  Updated Vital Signs BP 131/89 (BP Location: Left Arm)   Pulse 93   Temp 98.2 F (36.8 C) (Oral)   Resp 18   Ht 5' 6" (1.676 m)   Wt 131.1 kg   LMP 07/24/2018 (Exact Date) Comment: surgery 07/31/2018  SpO2 99%    BMI 46.65 kg/m   Visual Acuity Right Eye Distance:   Left Eye Distance:   Bilateral Distance:    Right Eye Near:   Left Eye Near:    Bilateral Near:     Physical Exam Constitutional:      General: She is in acute distress.     Appearance: She is ill-appearing. She is not toxic-appearing or diaphoretic.  HENT:     Right Ear: Tympanic membrane normal.     Left Ear: Tympanic membrane normal.     Nose: Nose normal. No rhinorrhea.     Mouth/Throat:     Mouth: Mucous membranes are moist.     Pharynx: No oropharyngeal exudate or posterior oropharyngeal erythema.  Eyes:     Conjunctiva/sclera: Conjunctivae normal.  Neck:     Musculoskeletal: Normal range of motion.  Cardiovascular:     Rate and Rhythm: Normal rate and regular rhythm.     Pulses: Normal pulses.  Pulmonary:     Effort: Pulmonary effort is normal. No respiratory distress.     Breath sounds: Normal breath sounds. No wheezing, rhonchi or rales.  Abdominal:     General: Bowel sounds are normal. There is no distension.     Tenderness: There is no guarding or rebound.  Musculoskeletal: Normal range of motion.        General: No swelling, deformity or signs of injury.  Skin:    General: Skin is warm.     Capillary Refill: Capillary refill takes less than 2 seconds.  Neurological:     General: No focal deficit present.     Mental Status: She is alert and oriented to person, place, and time.      UC Treatments / Results  Labs (all labs ordered are listed, but only abnormal results are displayed) Labs Reviewed  RAPID INFLUENZA A&B ANTIGENS ([facility] ONLY)  RAPID STREP SCREEN ([facility] ONLY)  NOVEL CORONAVIRUS, NAA (HOSP ORDER, SEND-OUT TO REF LAB; TAT 18-24 HRS)  CULTURE, GROUP A STREP [facility])    EKG   Radiology No results found.  Procedures Procedures (including critical care time)  Medications Ordered in UC Medications -  No data to display  Initial Impression / Assessment and Plan / UC Course  I  have reviewed the triage vital signs and the nursing notes.  Pertinent labs & imaging results that were available during my care of the patient were reviewed by me and considered in my medical decision making (see chart for details).     1.  Flulike illness: Influenza a and B is negative Strep throat is negative. COVID testing sent to lab Patient is going to be managed with Tylenol as needed for fever and generalized body aches.  Patient is advised to self isolate until COVID-19 test results are available.  If patient's symptoms worsens i.e. she starts developing shortness of breath, nausea vomiting or persistent diarrhea she needs to return to urgent care to be reevaluated.  If she develops confusion she needs to go to the emergency department. Final Clinical Impressions(s) / UC Diagnoses   Final diagnoses:  Influenza-like illness   Discharge Instructions   None    ED Prescriptions    Medication Sig Dispense Auth. Provider   acetaminophen (TYLENOL) 500 MG tablet Take 1 tablet (500 mg total) by mouth every 6 (six) hours as needed. 30 tablet Thomas Rhude, Myrene Galas, MD   acetaminophen (TYLENOL) 500 MG tablet Take 2 tablets (1,000 mg total) by mouth every 6 (six) hours as needed. 30 tablet Tawyna Pellot, Myrene Galas, MD     PDMP not reviewed this encounter.   Chase Picket, MD 05/03/19 [place]

## 2019-05-05 LAB — CULTURE, GROUP A STREP (THRC)

## 2019-05-06 ENCOUNTER — Encounter: Payer: Self-pay | Admitting: *Deleted

## 2019-05-06 ENCOUNTER — Ambulatory Visit (INDEPENDENT_AMBULATORY_CARE_PROVIDER_SITE_OTHER): Payer: Self-pay | Admitting: Family Medicine

## 2019-05-06 ENCOUNTER — Encounter: Payer: Self-pay | Admitting: Family Medicine

## 2019-05-06 ENCOUNTER — Other Ambulatory Visit: Payer: Self-pay

## 2019-05-06 DIAGNOSIS — J45901 Unspecified asthma with (acute) exacerbation: Secondary | ICD-10-CM

## 2019-05-06 DIAGNOSIS — E119 Type 2 diabetes mellitus without complications: Secondary | ICD-10-CM

## 2019-05-06 DIAGNOSIS — Z794 Long term (current) use of insulin: Secondary | ICD-10-CM

## 2019-05-06 LAB — NOVEL CORONAVIRUS, NAA (HOSP ORDER, SEND-OUT TO REF LAB; TAT 18-24 HRS): SARS-CoV-2, NAA: NOT DETECTED

## 2019-05-06 MED ORDER — PREDNISONE 10 MG PO TABS: ORAL_TABLET | ORAL | 0 refills | Status: DC

## 2019-05-06 MED ORDER — AZITHROMYCIN 250 MG PO TABS: ORAL_TABLET | ORAL | 0 refills | Status: DC

## 2019-05-06 NOTE — Progress Notes (Signed)
VIRTUAL VISIT Due to national recommendations of social distancing due to [place] 19, a virtual visit is felt to be most appropriate for this patient at this time.   I connected with the patient on 05/06/19 at  9:00 AM EDT by virtual telehealth platform and verified that I am speaking with the correct person using two identifiers.   I discussed the limitations, risks, security and privacy concerns of performing an evaluation and management service by  virtual telehealth platform and the availability of in person appointments. I also discussed with the patient that there may be a patient responsible charge related to this service. The patient expressed understanding and agreed to proceed.  Patient location: Home Provider Location: [facility] Participants: Eliezer Lofts and [name]   Chief Complaint  Patient presents with  . Chills    seen at [facility] on 05/03/2019 and was tested for flu/covid and strep-All negative  . Generalized Body Aches  . Headache  . Otalgia  . Hoarse    History of Present Illness 38 year old  female  Patient of  Tawni Millers with DM, asthma presents with  chills, body aches, Headache, ear pain and hoarse voice x 5 days. She reports she is still feeling really tired and achy,  Bilateral ear pain,  Sore throat continue. Occ dry cough. Temperature 100 F few days ago.. now in normal range.  She has been feeling more shortness of breath, not worsening.  Using albuterol twice daily to treat.. helps some.   Seen at Urgent Care on 05/03/2019  Neg  Rapid Strep test, Neg Flu A and B Neg strep culture  Neg COVID19  She works in Herbalist setting Was  on advair ( not currently using as cannot afford), Albuterol prn, duo nebs prn.   Nonsmoker.   She is using tylenol. Does not help much. Cannot take ibuprofen.  COVID 19 screen No recent travel or known exposure to [place] The importance of social distancing was discussed today.   Review of Systems   Constitutional: Positive for chills and malaise/fatigue. Negative for diaphoresis and fever.  HENT: Positive for congestion, ear pain and sore throat. Negative for ear discharge and sinus pain.   Eyes: Negative for pain.  Respiratory: Positive for cough, shortness of breath and wheezing. Negative for hemoptysis and sputum production.   Cardiovascular: Negative for chest pain and leg swelling.  Musculoskeletal: Positive for myalgias.  Skin: Negative for rash.      Past Medical History:  Diagnosis Date  . Asthma   . Auditory hallucinations    only after anesthesia  . Diabetes mellitus without complication (HCC)    diet controlled  . Diabetes mellitus, type II (HCC)    insulin, jardiance  . Dyspnea    with exertion  . Essential hypertension   . Essential hypertension 11/24/2017  . GERD (gastroesophageal reflux disease)    occasionally  . MDD (major depressive disorder)   . Miscarriage   . PTSD (post-traumatic stress disorder)     reports that she has never smoked. She has never used smokeless tobacco. She reports that she does not drink alcohol or use drugs.   Current Outpatient Medications:  .  acetaminophen (TYLENOL) 500 MG tablet, Take 1 tablet (500 mg total) by mouth every 6 (six) hours as needed., Disp: 30 tablet, Rfl: 0 .  acetaminophen (TYLENOL) 500 MG tablet, Take 2 tablets (1,000 mg total) by mouth every 6 (six) hours as needed., Disp: 30 tablet, Rfl:  .  ADVAIR DISKUS 250-50 MCG/DOSE AEPB, INHALE 1 PUFF INTO THE LUNGS TWICE DAILY. RINSE MOUTH AFTER USE, Disp: 60 each, Rfl: 2 .  albuterol (VENTOLIN HFA) 108 (90 Base) MCG/ACT inhaler, INHALE 2 PUFFS BY MOUTH EVERY 6 HOURS AS NEEDED FOR SHORTNESS OF BREATH AND WHEEZING, Disp: 18 g, Rfl: 0 .  ARIPiprazole (ABILIFY) 5 MG tablet, TAKE 1 AND 1/2 TABLETS(7.5 MG) BY MOUTH DAILY, Disp: 45 tablet, Rfl: 0 .  atorvastatin (LIPITOR) 40 MG tablet, Take 1 tablet by mouth at bedtime for cholesterol. (Patient taking differently: Take 40 mg  by mouth at bedtime. Take 1 tablet by mouth at bedtime for cholesterol.), Disp: 90 tablet, Rfl: 3 .  blood glucose meter kit and supplies KIT, Dispense based on patient and insurance preference. Use up three times daily as directed. (FOR ICD-9 250.00, 250.01)., Disp: 1 each, Rfl: 0 .  citalopram (CELEXA) 20 MG tablet, TAKE 1 TABLET(20 MG) BY MOUTH DAILY, Disp: 90 tablet, Rfl: 1 .  doxepin (SINEQUAN) 10 MG capsule, TAKE 1 TO 2 CAPSULES(10 TO 20 MG) BY MOUTH AT BEDTIME FOR SLEEP, Disp: 60 capsule, Rfl: 0 .  Dulaglutide (TRULICITY) 6.64 QI/3.4VQ SOPN, Inject once weekly for diabetes., Disp: 2 mL, Rfl: 3 .  hydrOXYzine (ATARAX/VISTARIL) 50 MG tablet, Take 50 mg by mouth at bedtime. , Disp: , Rfl: 0 .  insulin NPH-regular Human (70-30) 100 UNIT/ML injection, Inject 25 Units into the skin 2 (two) times daily., Disp: , Rfl:  .  Insulin Pen Needle (PEN NEEDLES) 31G X 6 MM MISC, Use with insulin as directed., Disp: 100 each, Rfl: 2 .  ipratropium-albuterol (DUONEB) 0.5-2.5 (3) MG/3ML SOLN, Take 3 mLs by nebulization every 6 (six) hours as needed., Disp: 360 mL, Rfl: 5 .  losartan (COZAAR) 100 MG tablet, Take 1 tablet (100 mg total) by mouth daily., Disp: 90 tablet, Rfl: 3 .  norethindrone (MICRONOR) 0.35 MG tablet, Take by mouth., Disp: , Rfl:  .  Spacer/Aero-Holding Chambers (AEROCHAMBER PLUS) inhaler, Use as instructed. Generic spacer if possible, Disp: 1 each, Rfl: 2   Observations/Objective: Height _0  (1.676 m), last menstrual period 07/24/2018.  Physical Exam  Physical Exam Constitutional:      General: The patient is not in acute distress. Pulmonary:     Effort: Pulmonary effort is normal. No respiratory distress.  Neurological:     Mental Status: The patient is alert and oriented to person, place, and time.  Psychiatric:        Mood and Affect: Mood normal.        Behavior: Behavior normal.   Assessment and Plan   Acute asthma exacerbation Likely viral trigger ( neg flu. Strep, covid)  but having significant issues with SOB and wheeze.   Treat with pred taper ( low dose given Dm, follow CBGs closely) and start antibiotics for possible bacterial superinfection.  Remain out of work until respiratory symptoms resolved. ER precautions reviewed.   Type 2 diabetes mellitus (HCC) Follow CBG closely on prednisone.  Lab Results  Component Value Date   HGBA1C 13.9 (H) 02/15/2019    I discussed the assessment and treatment plan with the patient. The patient was provided an opportunity to ask questions and all were answered. The patient agreed with the plan and demonstrated an understanding of the instructions.   The patient was advised to call back or seek an in-person evaluation if the symptoms worsen or if the condition fails to improve as anticipated.     Eliezer Lofts, MD

## 2019-05-06 NOTE — Patient Instructions (Addendum)
Can try aleve instead of ibuprofen for body ache.  Complete prednisone low dose taper.  Follow sugars closely on steroid and call for med adjustment if needed.  Start antibiotics for possible bacterial superinfection. Go to ER is severe shortness of breath.

## 2019-05-06 NOTE — Assessment & Plan Note (Signed)
Follow CBG closely on prednisone.

## 2019-05-06 NOTE — Progress Notes (Signed)
Fax number sent to patient via MyChart.

## 2019-05-06 NOTE — Assessment & Plan Note (Signed)
Likely viral trigger ( neg flu. Strep, covid) but having significant issues with SOB and wheeze.   Treat with pred taper ( low dose given Dm, follow CBGs closely) and start antibiotics for possible bacterial superinfection.  Remain out of work until respiratory symptoms resolved. ER precautions reviewed.

## 2019-05-18 ENCOUNTER — Ambulatory Visit (INDEPENDENT_AMBULATORY_CARE_PROVIDER_SITE_OTHER): Payer: Self-pay | Admitting: Family Medicine

## 2019-05-18 ENCOUNTER — Telehealth: Payer: Self-pay | Admitting: Primary Care

## 2019-05-18 ENCOUNTER — Other Ambulatory Visit: Payer: Self-pay

## 2019-05-18 ENCOUNTER — Encounter: Payer: Self-pay | Admitting: Family Medicine

## 2019-05-18 ENCOUNTER — Ambulatory Visit
Admission: RE | Admit: 2019-05-18 | Discharge: 2019-05-18 | Disposition: A | Discharge status: Home / Self Care | Payer: Self-pay | Source: Ambulatory Visit | Attending: Family Medicine | Admitting: Family Medicine

## 2019-05-18 VITALS — BP 128/64 | HR 101 | Temp 97.3°F | Ht 66.0 in | Wt 287.4 lb

## 2019-05-18 DIAGNOSIS — R1031 Right lower quadrant pain: Secondary | ICD-10-CM

## 2019-05-18 DIAGNOSIS — R300 Dysuria: Secondary | ICD-10-CM

## 2019-05-18 DIAGNOSIS — E1165 Type 2 diabetes mellitus with hyperglycemia: Secondary | ICD-10-CM

## 2019-05-18 LAB — COMPREHENSIVE METABOLIC PANEL
ALT: 30 U/L (ref 0–35)
AST: 19 U/L (ref 0–37)
Albumin: 4 g/dL (ref 3.5–5.2)
Alkaline Phosphatase: 124 U/L — ABNORMAL HIGH (ref 39–117)
BUN: 15 mg/dL (ref 6–23)
CO2: 30 mEq/L (ref 19–32)
Calcium: 9.4 mg/dL (ref 8.4–10.5)
Chloride: 98 mEq/L (ref 96–112)
Creatinine, Ser: 0.69 mg/dL (ref 0.40–1.20)
GFR: 94.37 mL/min (ref >60.00)
Glucose, Bld: 420 mg/dL — ABNORMAL HIGH (ref 70–99)
Potassium: 4.9 mEq/L (ref 3.5–5.1)
Sodium: 134 mEq/L — ABNORMAL LOW (ref 135–145)
Total Bilirubin: 0.5 mg/dL (ref 0.2–1.2)
Total Protein: 6.9 g/dL (ref 6.0–8.3)

## 2019-05-18 LAB — CBC WITH DIFFERENTIAL/PLATELET
Basophils Absolute: 0 10*3/uL (ref 0.0–0.1)
Basophils Relative: 0.6 % (ref 0.0–3.0)
Eosinophils Absolute: 0.2 10*3/uL (ref 0.0–0.7)
Eosinophils Relative: 3.3 % (ref 0.0–5.0)
HCT: 42.1 % (ref 36.0–46.0)
Hemoglobin: 14 g/dL (ref 12.0–15.0)
Lymphocytes Relative: 33.6 % (ref 12.0–46.0)
Lymphs Abs: 1.6 10*3/uL (ref 0.7–4.0)
MCHC: 33.3 g/dL (ref 30.0–36.0)
MCV: 83.2 fl (ref 78.0–100.0)
Monocytes Absolute: 0.3 10*3/uL (ref 0.1–1.0)
Monocytes Relative: 5.4 % (ref 3.0–12.0)
Neutro Abs: 2.7 10*3/uL (ref 1.4–7.7)
Neutrophils Relative %: 57.1 % (ref 43.0–77.0)
Platelets: 253 10*3/uL (ref 150.0–400.0)
RBC: 5.06 Mil/uL (ref 3.87–5.11)
RDW: 14.3 % (ref 11.5–15.5)
WBC: 4.7 10*3/uL (ref 4.0–10.5)

## 2019-05-18 LAB — POCT URINALYSIS DIPSTICK
Bilirubin, UA: NEGATIVE
Blood, UA: NEGATIVE
Glucose, UA: POSITIVE — AB
Ketones, UA: NEGATIVE
Leukocytes, UA: NEGATIVE
Nitrite, UA: NEGATIVE
Protein, UA: NEGATIVE
Spec Grav, UA: 1.015 (ref 1.010–1.025)
Urobilinogen, UA: 0.2 E.U./dL
pH, UA: 7 (ref 5.0–8.0)

## 2019-05-18 LAB — LIPASE: Lipase: 18 U/L (ref 11.0–59.0)

## 2019-05-18 MED ORDER — ACETAMINOPHEN-CODEINE #3 300-30 MG PO TABS: 1.0000 | ORAL_TABLET | Freq: Three times a day (TID) | ORAL | 0 refills | Status: DC | PRN

## 2019-05-18 MED ORDER — IOHEXOL 300 MG/ML  SOLN
100.0000 mL | Freq: Once | INTRAMUSCULAR | Status: AC | PRN
  Administered 2019-05-18: 100 mL via INTRAVENOUS

## 2019-05-18 NOTE — Telephone Encounter (Signed)
Patient scheduled office visit with Dr.G today for a possible kidney stone.  Patient said she doesn't get paid until Friday.  Patient doesn't have insurance and wants to know if she can be billed for today's visit. She said she's unable to pay anything today.

## 2019-05-18 NOTE — Telephone Encounter (Signed)
It is fine for the patient to be seen today.

## 2019-05-18 NOTE — Telephone Encounter (Signed)
Patient notified

## 2019-05-18 NOTE — Patient Instructions (Addendum)
Labs today - see Rosaria Ferries to schedule CT scan today.  If worsening pain, go to ER.  Tylenol #3 for pain sent to pharmacy.

## 2019-05-18 NOTE — Progress Notes (Signed)
This visit was conducted in person.  BP 128/64   Pulse (!) 101   Temp (!) 97.3 F (36.3 C)   Ht '5\' 6"'  (1.676 m)   Wt 287 lb 7 oz (130.4 kg)   LMP 07/24/2018 (Exact Date) Comment: surgery 07/31/2018  SpO2 97%   BMI 46.39 kg/m    CC: R lower abd pain Subjective:    Patient ID: Joan [name], female    DOB: 10-16-1980, 38 y.o.   MRN: [id number]  HPI: Joan [name] is a 38 y.o. female presenting on 05/18/2019 for Urinary Urgency (started 05/17/2019 with back pain. Has pain and burning with urination.)   1d h/o R lower back pain with radiation to R abdomen. Sharp stabbing constant pain. Currently 10/10 pain. Treated pain with aleve without benefit. Worse with urination. No alleviating factors. Pain causes nausea. Appetite ok. Last meal was 11:30am - chips and salsa. No periumbilical pain. She is having dysuria, and R sided pain with urination, incomplete emptying. This pain is more sharp and severe than her prior kidney stone pain (2-3 months ago). She did pass stone 02/2019.   No fevers/chills, vomiting, diarrhea, constipation, blood in stool.   Last BM earlier today.  S/p cholecystectomy, hysterectomy and RSO (07/2019) - L ovary remains.   She has had 2 contrasted CT scans this past year (08/2018 and again 02/2019) for abdominal pain.   Known diabetic - has not been checking sugars. On trulicity and novolin 02/11.      Relevant past medical, surgical, family and social history reviewed and updated as indicated. Interim medical history since our last visit reviewed. Allergies and medications reviewed and updated. Outpatient Medications Prior to Visit  Medication Sig Dispense Refill  . acetaminophen (TYLENOL) 500 MG tablet Take 1 tablet (500 mg total) by mouth every 6 (six) hours as needed. 30 tablet 0  . acetaminophen (TYLENOL) 500 MG tablet Take 2 tablets (1,000 mg total) by mouth every 6 (six) hours as needed. 30 tablet   . ADVAIR DISKUS 250-50 MCG/DOSE AEPB INHALE 1 PUFF INTO  THE LUNGS TWICE DAILY. RINSE MOUTH AFTER USE 60 each 2  . albuterol (VENTOLIN HFA) 108 (90 Base) MCG/ACT inhaler INHALE 2 PUFFS BY MOUTH EVERY 6 HOURS AS NEEDED FOR SHORTNESS OF BREATH AND WHEEZING 18 g 0  . ARIPiprazole (ABILIFY) 5 MG tablet TAKE 1 AND 1/2 TABLETS(7.5 MG) BY MOUTH DAILY 45 tablet 0  . atorvastatin (LIPITOR) 40 MG tablet Take 1 tablet by mouth at bedtime for cholesterol. (Patient taking differently: Take 40 mg by mouth at bedtime. Take 1 tablet by mouth at bedtime for cholesterol.) 90 tablet 3  . blood glucose meter kit and supplies KIT Dispense based on patient and insurance preference. Use up three times daily as directed. (FOR ICD-9 250.00, 250.01). 1 each 0  . citalopram (CELEXA) 20 MG tablet TAKE 1 TABLET(20 MG) BY MOUTH DAILY 90 tablet 1  . doxepin (SINEQUAN) 10 MG capsule TAKE 1 TO 2 CAPSULES(10 TO 20 MG) BY MOUTH AT BEDTIME FOR SLEEP 60 capsule 0  . Dulaglutide (TRULICITY) 1.73 VA/7.0LI SOPN Inject once weekly for diabetes. 2 mL 3  . hydrOXYzine (ATARAX/VISTARIL) 50 MG tablet Take 50 mg by mouth at bedtime.   0  . insulin NPH-regular Human (70-30) 100 UNIT/ML injection Inject 25 Units into the skin 2 (two) times daily.    . Insulin Pen Needle (PEN NEEDLES) 31G X 6 MM MISC Use with insulin as directed. 100 each 2  . ipratropium-albuterol (  DUONEB) 0.5-2.5 (3) MG/3ML SOLN Take 3 mLs by nebulization every 6 (six) hours as needed. 360 mL 5  . losartan (COZAAR) 100 MG tablet Take 1 tablet (100 mg total) by mouth daily. 90 tablet 3  . norethindrone (MICRONOR) 0.35 MG tablet Take by mouth.    . Spacer/Aero-Holding Chambers (AEROCHAMBER PLUS) inhaler Use as instructed. Generic spacer if possible 1 each 2  . azithromycin (ZITHROMAX) 250 MG tablet 2 tab po x 1 day then 1 tab po daily 6 tablet 0  . predniSONE (DELTASONE) 10 MG tablet 3 tabs by mouth daily x 3 days, then 2 tabs by mouth daily x 2 days then 1 tab by mouth daily x 2 days 15 tablet 0   No facility-administered medications  prior to visit.      Per HPI unless specifically indicated in ROS section below Review of Systems Objective:    BP 128/64   Pulse (!) 101   Temp (!) 97.3 F (36.3 C)   Ht '5\' 6"'  (1.676 m)   Wt 287 lb 7 oz (130.4 kg)   LMP 07/24/2018 (Exact Date) Comment: surgery 07/31/2018  SpO2 97%   BMI 46.39 kg/m   Wt Readings from Last 3 Encounters:  05/18/19 287 lb 7 oz (130.4 kg)  05/03/19 289 lb (131.1 kg)  04/27/19 294 lb 8 oz (133.6 kg)    Physical Exam Vitals signs and nursing note reviewed.  Constitutional:      General: She is in acute distress.     Appearance: Normal appearance. She is obese. She is not ill-appearing.  HENT:     Mouth/Throat:     Mouth: Mucous membranes are moist.     Pharynx: Oropharynx is clear. No posterior oropharyngeal erythema.  Eyes:     Extraocular Movements: Extraocular movements intact.  Cardiovascular:     Rate and Rhythm: Regular rhythm. Tachycardia present.     Pulses: Normal pulses.     Heart sounds: Normal heart sounds. No murmur.  Pulmonary:     Effort: Pulmonary effort is normal. No respiratory distress.     Breath sounds: Normal breath sounds. No wheezing, rhonchi or rales.  Abdominal:     General: Bowel sounds are normal. There is no distension.     Palpations: Abdomen is soft. There is no mass.     Tenderness: There is abdominal tenderness (mod-severe) in the right lower quadrant. There is guarding (mild). There is no right CVA tenderness, left CVA tenderness or rebound. Negative signs include Murphy's sign.     Hernia: No hernia is present.  Musculoskeletal:     Right lower leg: No edema.     Left lower leg: No edema.  Neurological:     Mental Status: She is alert.  Psychiatric:        Mood and Affect: Mood normal.        Behavior: Behavior normal.       Results for orders placed or performed in visit on 05/18/19  Comprehensive metabolic panel  Result Value Ref Range   Sodium 134 (L) 135 - 145 mEq/L   Potassium 4.9 3.5 - 5.1  mEq/L   Chloride 98 96 - 112 mEq/L   CO2 30 19 - 32 mEq/L   Glucose, Bld 420 (H) 70 - 99 mg/dL   BUN 15 6 - 23 mg/dL   Creatinine, Ser 0.69 0.40 - 1.20 mg/dL   Total Bilirubin 0.5 0.2 - 1.2 mg/dL   Alkaline Phosphatase 124 (H) 39 - 117 U/L  AST 19 0 - 37 U/L   ALT 30 0 - 35 U/L   Total Protein 6.9 6.0 - 8.3 g/dL   Albumin 4.0 3.5 - 5.2 g/dL   Calcium 9.4 8.4 - 10.5 mg/dL   GFR 94.37 >60.00 mL/min  CBC with Differential/Platelet  Result Value Ref Range   WBC 4.7 4.0 - 10.5 K/uL   RBC 5.06 3.87 - 5.11 Mil/uL   Hemoglobin 14.0 12.0 - 15.0 g/dL   HCT 42.1 36.0 - 46.0 %   MCV 83.2 78.0 - 100.0 fl   MCHC 33.3 30.0 - 36.0 g/dL   RDW 14.3 11.5 - 15.5 %   Platelets 253.0 150.0 - 400.0 K/uL   Neutrophils Relative % 57.1 43.0 - 77.0 %   Lymphocytes Relative 33.6 12.0 - 46.0 %   Monocytes Relative 5.4 3.0 - 12.0 %   Eosinophils Relative 3.3 0.0 - 5.0 %   Basophils Relative 0.6 0.0 - 3.0 %   Neutro Abs 2.7 1.4 - 7.7 K/uL   Lymphs Abs 1.6 0.7 - 4.0 K/uL   Monocytes Absolute 0.3 0.1 - 1.0 K/uL   Eosinophils Absolute 0.2 0.0 - 0.7 K/uL   Basophils Absolute 0.0 0.0 - 0.1 K/uL  Lipase  Result Value Ref Range   Lipase 18.0 11.0 - 59.0 U/L  POCT urinalysis dipstick  Result Value Ref Range   Color, UA yellow    Clarity, UA slightly cloudy    Glucose, UA Positive (A) Negative   Bilirubin, UA negative    Ketones, UA negative    Spec Grav, UA 1.015 1.010 - 1.025   Blood, UA negative    pH, UA 7.0 5.0 - 8.0   Protein, UA Negative Negative   Urobilinogen, UA 0.2 0.2 or 1.0 E.U./dL   Nitrite, UA negative    Leukocytes, UA Negative Negative   Appearance     Odor     Assessment & Plan:   Problem List Items Addressed This Visit    Right lower quadrant abdominal pain - Primary    Given story and exam - severe ongoing RLQ abdominal pain - do think she will need stat imaging to help rule out appendicitis, further eval for kidney stone (although no hematuria). Will check labwork as well  (CBC, CMP, lipase).  UCx sent for endorsed dysuria.       Relevant Orders   Comprehensive metabolic panel (Completed)   CBC with Differential/Platelet (Completed)   Lipase (Completed)   CT Abdomen Pelvis W Contrast (Completed)   Diabetes mellitus type 2, uncontrolled ([place])    Other Visit Diagnoses    Burning with urination       Relevant Orders   POCT urinalysis dipstick (Completed)   Urine Culture       No orders of the defined types were placed in this encounter.  Orders Placed This Encounter  Procedures  . Urine Culture  . CT Abdomen Pelvis W Contrast    Last Meal: chips and salsa     Standing Status:   Future    Number of Occurrences:   1    Standing Expiration Date:   08/17/2020    Order Specific Question:   If indicated for the ordered procedure, I authorize the administration of contrast media per Radiology protocol    Answer:   Yes    Order Specific Question:   Is patient pregnant?    Answer:   No    Order Specific Question:   Preferred imaging location?  Answer:   [facility]    Order Specific Question:   Is Oral Contrast requested for this exam?    Answer:   Yes, Per Radiology protocol    Order Specific Question:   Call Results- Best Contact Number?    Answer:   [phone] Dr Cleora Fleet  hold and call     Order Specific Question:   Radiology Contrast Protocol - do NOT remove file path    Answer:   [email or web address]  . Comprehensive metabolic panel  . CBC with Differential/Platelet  . Lipase  . POCT urinalysis dipstick    Follow up plan: No follow-ups on file.  Ria Bush, MD

## 2019-05-18 NOTE — Assessment & Plan Note (Addendum)
Given story and exam - severe ongoing RLQ abdominal pain - do think she will need stat imaging to help rule out appendicitis, further eval for kidney stone (although no hematuria). Will check labwork as well (CBC, CMP, lipase).  UCx sent for endorsed dysuria.

## 2019-05-20 ENCOUNTER — Encounter: Payer: Self-pay | Admitting: Family Medicine

## 2019-05-20 DIAGNOSIS — R110 Nausea: Secondary | ICD-10-CM

## 2019-05-20 DIAGNOSIS — R1031 Right lower quadrant pain: Secondary | ICD-10-CM

## 2019-05-20 LAB — URINE CULTURE
MICRO NUMBER:: 984255
SPECIMEN QUALITY:: ADEQUATE

## 2019-05-20 MED ORDER — ONDANSETRON 4 MG PO TBDP: 4.0000 mg | ORAL_TABLET | Freq: Three times a day (TID) | ORAL | 0 refills | Status: DC | PRN

## 2019-05-21 ENCOUNTER — Encounter: Payer: Self-pay | Admitting: Primary Care

## 2019-05-21 ENCOUNTER — Ambulatory Visit: Payer: Self-pay | Admitting: Primary Care

## 2019-05-21 ENCOUNTER — Other Ambulatory Visit: Payer: Self-pay

## 2019-05-21 VITALS — BP 126/80 | HR 104 | Temp 97.9°F | Ht 66.0 in | Wt 284.5 lb

## 2019-05-21 DIAGNOSIS — E785 Hyperlipidemia, unspecified: Secondary | ICD-10-CM

## 2019-05-21 DIAGNOSIS — F332 Major depressive disorder, recurrent severe without psychotic features: Secondary | ICD-10-CM

## 2019-05-21 DIAGNOSIS — R1031 Right lower quadrant pain: Secondary | ICD-10-CM

## 2019-05-21 DIAGNOSIS — J4540 Moderate persistent asthma, uncomplicated: Secondary | ICD-10-CM

## 2019-05-21 DIAGNOSIS — E1165 Type 2 diabetes mellitus with hyperglycemia: Secondary | ICD-10-CM

## 2019-05-21 DIAGNOSIS — I10 Essential (primary) hypertension: Secondary | ICD-10-CM

## 2019-05-21 LAB — POCT GLYCOSYLATED HEMOGLOBIN (HGB A1C): Hemoglobin A1C: 12.4 % — AB (ref 4.0–5.6)

## 2019-05-21 MED ORDER — GABAPENTIN 100 MG PO CAPS: 100.0000 mg | ORAL_CAPSULE | Freq: Three times a day (TID) | ORAL | 0 refills | Status: DC

## 2019-05-21 NOTE — Assessment & Plan Note (Signed)
Stopped atorvastatin months ago. Consider restarting after next lipid check in December 2020.

## 2019-05-21 NOTE — Assessment & Plan Note (Signed)
Using albuterol inhaler and nebulized treatments. She plans on re-establishing with pulmonology once her insurance returns.

## 2019-05-21 NOTE — Patient Instructions (Signed)
Start Lantus and inject 30 units into the skin every evening at bedtime.  Start checking your blood glucose levels as discussed. Record your readings on the logs provided and bring them to your next visit.  It is important that you improve your diet. Please limit carbohydrates in the form of white bread, rice, pasta, sweets, fast food, fried food, sugary drinks, etc. Increase your consumption of fresh fruits and vegetables, whole grains, lean protein.  Ensure you are consuming 64 ounces of water daily.  You can take the gabapentin up to three times daily for pain. This may cause drowsiness.  Please notify your GYN of your symptoms.   Schedule a follow up visit with me for early December 2020.  It was a pleasure to see you today!   Diabetes Mellitus and Nutrition, Adult When you have diabetes (diabetes mellitus), it is very important to have healthy eating habits because your blood sugar (glucose) levels are greatly affected by what you eat and drink. Eating healthy foods in the appropriate amounts, at about the same times every day, can help you:  Control your blood glucose.  Lower your risk of heart disease.  Improve your blood pressure.  Reach or maintain a healthy weight. Every person with diabetes is different, and each person has different needs for a meal plan. Your health care provider may recommend that you work with a diet and nutrition specialist (dietitian) to make a meal plan that is best for you. Your meal plan may vary depending on factors such as:  The calories you need.  The medicines you take.  Your weight.  Your blood glucose, blood pressure, and cholesterol levels.  Your activity level.  Other health conditions you have, such as heart or kidney disease. How do carbohydrates affect me? Carbohydrates, also called carbs, affect your blood glucose level more than any other type of food. Eating carbs naturally raises the amount of glucose in your blood. Carb  counting is a method for keeping track of how many carbs you eat. Counting carbs is important to keep your blood glucose at a healthy level, especially if you use insulin or take certain oral diabetes medicines. It is important to know how many carbs you can safely have in each meal. This is different for every person. Your dietitian can help you calculate how many carbs you should have at each meal and for each snack. Foods that contain carbs include:  Bread, cereal, rice, pasta, and crackers.  Potatoes and corn.  Peas, beans, and lentils.  Milk and yogurt.  Fruit and juice.  Desserts, such as cakes, cookies, ice cream, and candy. How does alcohol affect me? Alcohol can cause a sudden decrease in blood glucose (hypoglycemia), especially if you use insulin or take certain oral diabetes medicines. Hypoglycemia can be a life-threatening condition. Symptoms of hypoglycemia (sleepiness, dizziness, and confusion) are similar to symptoms of having too much alcohol. If your health care provider says that alcohol is safe for you, follow these guidelines:  Limit alcohol intake to no more than 1 drink per day for nonpregnant women and 2 drinks per day for men. One drink equals 12 oz of beer, 5 oz of wine, or 1 oz of hard liquor.  Do not drink on an empty stomach.  Keep yourself hydrated with water, diet soda, or unsweetened iced tea.  Keep in mind that regular soda, juice, and other mixers may contain a lot of sugar and must be counted as carbs. What are tips for  following this plan?  Reading food labels  Start by checking the serving size on the "Nutrition Facts" label of packaged foods and drinks. The amount of calories, carbs, fats, and other nutrients listed on the label is based on one serving of the item. Many items contain more than one serving per package.  Check the total grams (g) of carbs in one serving. You can calculate the number of servings of carbs in one serving by dividing  the total carbs by 15. For example, if a food has 30 g of total carbs, it would be equal to 2 servings of carbs.  Check the number of grams (g) of saturated and trans fats in one serving. Choose foods that have low or no amount of these fats.  Check the number of milligrams (mg) of salt (sodium) in one serving. Most people should limit total sodium intake to less than 2,300 mg per day.  Always check the nutrition information of foods labeled as "low-fat" or "nonfat". These foods may be higher in added sugar or refined carbs and should be avoided.  Talk to your dietitian to identify your daily goals for nutrients listed on the label. Shopping  Avoid buying canned, premade, or processed foods. These foods tend to be high in fat, sodium, and added sugar.  Shop around the outside edge of the grocery store. This includes fresh fruits and vegetables, bulk grains, fresh meats, and fresh dairy. Cooking  Use low-heat cooking methods, such as baking, instead of high-heat cooking methods like deep frying.  Cook using healthy oils, such as olive, canola, or sunflower oil.  Avoid cooking with butter, cream, or high-fat meats. Meal planning  Eat meals and snacks regularly, preferably at the same times every day. Avoid going long periods of time without eating.  Eat foods high in fiber, such as fresh fruits, vegetables, beans, and whole grains. Talk to your dietitian about how many servings of carbs you can eat at each meal.  Eat 4-6 ounces (oz) of lean protein each day, such as lean meat, chicken, fish, eggs, or tofu. One oz of lean protein is equal to: ? 1 oz of meat, chicken, or fish. ? 1 egg. ?  cup of tofu.  Eat some foods each day that contain healthy fats, such as avocado, nuts, seeds, and fish. Lifestyle  Check your blood glucose regularly.  Exercise regularly as told by your health care provider. This may include: ? 150 minutes of moderate-intensity or vigorous-intensity exercise  each week. This could be brisk walking, biking, or water aerobics. ? Stretching and doing strength exercises, such as yoga or weightlifting, at least 2 times a week.  Take medicines as told by your health care provider.  Do not use any products that contain nicotine or tobacco, such as cigarettes and e-cigarettes. If you need help quitting, ask your health care provider.  Work with a Social worker or diabetes educator to identify strategies to manage stress and any emotional and social challenges. Questions to ask a health care provider  Do I need to meet with a diabetes educator?  Do I need to meet with a dietitian?  What number can I call if I have questions?  When are the best times to check my blood glucose? Where to find more information:  American Diabetes Association: diabetes.org  Academy of Nutrition and Dietetics: www.eatright.[employer] of Diabetes and Digestive and Kidney Diseases (NIH): [employer] Summary  A healthy meal plan will help you control your blood glucose  and maintain a healthy lifestyle.  Working with a diet and nutrition specialist (dietitian) can help you make a meal plan that is best for you.  Keep in mind that carbohydrates (carbs) and alcohol have immediate effects on your blood glucose levels. It is important to count carbs and to use alcohol carefully. This information is not intended to replace advice given to you by your health care provider. Make sure you discuss any questions you have with your health care provider. Document Released: 04/18/2005 Document Revised: 07/04/2017 Document Reviewed: 08/26/2016 Elsevier Patient Education  2020 [employer].

## 2019-05-21 NOTE — Assessment & Plan Note (Signed)
CT scan and labs without reason for pain/symptoms. Suspect her uncontrolled diabetes is contributing to nausea.  After exam today it seems as though her pain could be secondary to potential scar tissue from hysterectomy in December 2019. She points to the area of concern which is at her surgical scar.   Trial low dose gabapentin 100 mg TID with drowsiness precautions provided. She will contact GYN and update.
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
## 2019-05-21 NOTE — Assessment & Plan Note (Signed)
Stable in the office today, has not taken losartan in 3 weeks. Will closely monitor. Consider resuming low dose losartan for renal protection.

## 2019-05-21 NOTE — Assessment & Plan Note (Signed)
Continues to remain uncontrolled, she is non compliant.  Long discussion regarding the importance of gaining control over her diabetes and the potential consequences of continued hyperglycemia.  She agrees to resume her Lantus nightly and to start checking glucose levels. She has enough Lantus until her insurance starts which will be late November 2020.  Start with 30 units of Lantus HS. Continue Trulicity. We will plan to see her back as soon as possible, preferably early December 2020.

## 2019-05-21 NOTE — Assessment & Plan Note (Signed)
Off all medications for months.  Denies depression today. Continue to monitor.

## 2019-05-24 ENCOUNTER — Other Ambulatory Visit: Payer: Self-pay

## 2019-05-24 ENCOUNTER — Encounter: Payer: Self-pay | Admitting: Emergency Medicine

## 2019-05-24 ENCOUNTER — Emergency Department
Admission: EM | Admit: 2019-05-24 | Discharge: 2019-05-24 | Disposition: A | Discharge status: Home / Self Care | Payer: Self-pay | Attending: Emergency Medicine | Admitting: Emergency Medicine

## 2019-05-24 DIAGNOSIS — R1031 Right lower quadrant pain: Secondary | ICD-10-CM | POA: Insufficient documentation

## 2019-05-24 DIAGNOSIS — Z794 Long term (current) use of insulin: Secondary | ICD-10-CM | POA: Insufficient documentation

## 2019-05-24 DIAGNOSIS — Z79899 Other long term (current) drug therapy: Secondary | ICD-10-CM | POA: Insufficient documentation

## 2019-05-24 DIAGNOSIS — J45909 Unspecified asthma, uncomplicated: Secondary | ICD-10-CM | POA: Insufficient documentation

## 2019-05-24 DIAGNOSIS — R1030 Lower abdominal pain, unspecified: Secondary | ICD-10-CM | POA: Insufficient documentation

## 2019-05-24 DIAGNOSIS — E119 Type 2 diabetes mellitus without complications: Secondary | ICD-10-CM | POA: Insufficient documentation

## 2019-05-24 DIAGNOSIS — I10 Essential (primary) hypertension: Secondary | ICD-10-CM | POA: Insufficient documentation

## 2019-05-24 LAB — COMPREHENSIVE METABOLIC PANEL
ALT: 41 U/L (ref 0–44)
AST: 33 U/L (ref 15–41)
Albumin: 3.8 g/dL (ref 3.5–5.0)
Alkaline Phosphatase: 114 U/L (ref 38–126)
Anion gap: 10 (ref 5–15)
BUN: 17 mg/dL (ref 6–20)
CO2: 23 mmol/L (ref 22–32)
Calcium: 8.8 mg/dL — ABNORMAL LOW (ref 8.9–10.3)
Chloride: 104 mmol/L (ref 98–111)
Creatinine, Ser: 0.6 mg/dL (ref 0.44–1.00)
GFR calc Af Amer: >60 mL/min (ref >60)
GFR calc non Af Amer: >60 mL/min (ref >60)
Glucose, Bld: 163 mg/dL — ABNORMAL HIGH (ref 70–99)
Potassium: 4.1 mmol/L (ref 3.5–5.1)
Sodium: 137 mmol/L (ref 135–145)
Total Bilirubin: 0.6 mg/dL (ref 0.3–1.2)
Total Protein: 7 g/dL (ref 6.5–8.1)

## 2019-05-24 LAB — CBC
HCT: 43.5 % (ref 36.0–46.0)
Hemoglobin: 14.3 g/dL (ref 12.0–15.0)
MCH: 27 pg (ref 26.0–34.0)
MCHC: 32.9 g/dL (ref 30.0–36.0)
MCV: 82.2 fL (ref 80.0–100.0)
Platelets: 261 10*3/uL (ref 150–400)
RBC: 5.29 MIL/uL — ABNORMAL HIGH (ref 3.87–5.11)
RDW: 13.8 % (ref 11.5–15.5)
WBC: 4.7 10*3/uL (ref 4.0–10.5)
nRBC: 0 % (ref 0.0–0.2)

## 2019-05-24 LAB — LIPASE, BLOOD: Lipase: 27 U/L (ref 11–51)

## 2019-05-24 MED ORDER — HYDROCODONE-ACETAMINOPHEN 5-325 MG PO TABS: 1.0000 | ORAL_TABLET | ORAL | 0 refills | Status: DC | PRN

## 2019-05-24 MED ORDER — HYDROMORPHONE HCL 1 MG/ML IJ SOLN
1.0000 mg | Freq: Once | INTRAMUSCULAR | Status: AC
  Administered 2019-05-24: 1 mg via INTRAMUSCULAR
  Filled 2019-05-24: qty 1

## 2019-05-24 NOTE — ED Notes (Signed)
Pt with husband at time of discharge and able to ambulate to parking lot in [place]. Wheelchair offered but pt refused.

## 2019-05-24 NOTE — ED Triage Notes (Signed)
Pt reports a pulling pain to her lower abd area that has gotten worse over the last week. Pt reports that her MD told her it was the scar tissue from her hysterectomy last December. Pt states has had some pain but this is worse. Pt had CT scan done last week and it was negative. Pt reports all blood work was negative as well.

## 2019-05-24 NOTE — ED Provider Notes (Signed)
[facility] Emergency Department Provider Note  Time seen: 1:05 PM  I have reviewed the triage vital signs and the nursing notes.   HISTORY  Chief Complaint Abdominal Pain   HPI Joan [name] is a 38 y.o. female with a past medical history of diabetes, hypertension, gastric reflux, presents to the emergency department for abdominal pain.  According to the patient  over the past week or so she has been experiencing pulling pain to the right lower abdomen.  Patient states this has been an ongoing issue over the past 1 year or so.  Has followed up with her OB/GYN who performed a hysterectomy on the patient December but states she no longer has insurance and is having difficulty getting into the office to see her.  Patient had a CT scan that was - 05/18/2019.  Patient denies any fever cough or shortness of breath.  Past Medical History:  Diagnosis Date  . Asthma   . Auditory hallucinations    only after anesthesia  . Diabetes mellitus without complication (HCC)    diet controlled  . Diabetes mellitus, type II (HCC)    insulin, jardiance  . Dyspnea    with exertion  . Essential hypertension   . Essential hypertension 11/24/2017  . GERD (gastroesophageal reflux disease)    occasionally  . MDD (major depressive disorder)   . Miscarriage   . PTSD (post-traumatic stress disorder)     Patient Active Problem List   Diagnosis Date Noted  . Kidney stone 02/11/2019  . Asthma 11/03/2018  . Acute non-recurrent maxillary sinusitis 09/25/2018  . Recurrent sinusitis 05/20/2018  . History of placement of ear tubes 05/20/2018  . Fever and chills 05/08/2018  . Tachycardia 05/04/2018  . Missed period 04/29/2018  . Acute asthma exacerbation 04/10/2018  . Right lower quadrant abdominal pain 02/13/2018  . BV (bacterial vaginosis) 02/13/2018  . Diabetes mellitus type 2, uncontrolled ([place]) 11/24/2017  . Essential hypertension 11/24/2017  . Hyperlipidemia 11/24/2017  .  Major depressive disorder, recurrent, severe without psychotic features ([place])   . PTSD (post-traumatic stress disorder) 04/11/2015    Past Surgical History:  Procedure Laterality Date  . ABDOMINAL HYSTERECTOMY    . ADENOIDECTOMY    . CHOLECYSTECTOMY  2009  . CYSTOSCOPY N/A 07/31/2018   Procedure: CYSTOSCOPY;  Surgeon: Benjaman Kindler, MD;  Location: [facility];  Service: Gynecology;  Laterality: N/A;  . LAPAROSCOPIC BILATERAL SALPINGECTOMY Bilateral 07/31/2018   Procedure: LAPAROSCOPIC BILATERAL SALPINGECTOMY;  Surgeon: Benjaman Kindler, MD;  Location: [facility];  Service: Gynecology;  Laterality: Bilateral;  . LAPAROSCOPIC HYSTERECTOMY N/A 07/31/2018   Procedure: HYSTERECTOMY TOTAL LAPAROSCOPIC;  Surgeon: Benjaman Kindler, MD;  Location: [facility];  Service: Gynecology;  Laterality: N/A;  . LYSIS OF ADHESION N/A 07/31/2018   Procedure: LYSIS OF ADHESION;  Surgeon: Benjaman Kindler, MD;  Location: [facility] OR[facility];  Service: Gynecology;  Laterality: N/A;  . OVARY SURGERY Right    cyst removed a while ago  . TONSILLECTOMY    . tubes in ear      Prior to Admission medications   Medication Sig Start Date End Date Taking? Authorizing Provider  ADVAIR DISKUS 250-50 MCG/DOSE AEPB INHALE 1 PUFF INTO THE LUNGS TWICE DAILY. RINSE MOUTH AFTER USE 08/24/18   Laverle Hobby, MD  albuterol (VENTOLIN HFA) 108 (90 Base) MCG/ACT inhaler INHALE 2 PUFFS BY MOUTH EVERY 6 HOURS AS NEEDED FOR SHORTNESS OF BREATH AND WHEEZING 10/19/18   Pleas Koch, NP  blood glucose meter kit and supplies KIT  Dispense based on patient and insurance preference. Use up three times daily as directed. (FOR ICD-9 250.00, 250.01). 11/24/17   Pleas Koch, NP  Dulaglutide (TRULICITY) 7.67 HA/1.9FX SOPN Inject once weekly for diabetes. 02/15/19   Pleas Koch, NP  gabapentin (NEURONTIN) 100 MG capsule Take 1 capsule (100 mg total) by mouth 3 (three) times daily. 05/21/19   Pleas Koch, NP  insulin glargine  (LANTUS) 100 UNIT/ML injection Inject 30 Units into the skin every evening.    [provider]  Insulin Pen Needle (PEN NEEDLES) 31G X 6 MM MISC Use with insulin as directed. 11/04/18   Pleas Koch, NP  ipratropium-albuterol (DUONEB) 0.5-2.5 (3) MG/3ML SOLN Take 3 mLs by nebulization every 6 (six) hours as needed. 04/16/18   Laverle Hobby, MD  ondansetron (ZOFRAN ODT) 4 MG disintegrating tablet Take 1 tablet (4 mg total) by mouth every 8 (eight) hours as needed for nausea or vomiting. 05/20/19   Pleas Koch, NP  Spacer/Aero-Holding Chambers (AEROCHAMBER PLUS) inhaler Use as instructed. Generic spacer if possible 03/30/18   Copland, Frederico Hamman, MD  montelukast (SINGULAIR) 10 MG tablet TAKE 1 TABLET(10 MG) BY MOUTH AT BEDTIME 08/14/18 02/28/19  Laverle Hobby, MD    Allergies  Allergen Reactions  . Ibuprofen Swelling    Facial   . Ciprofloxacin Hives and Rash  . Metformin And Related Other (See Comments)    Elevated Lactic Acid  . Penicillins Hives and Rash    Has patient had a PCN reaction causing immediate rash, facial/tongue/throat swelling, SOB or lightheadedness with hypotension: No Has patient had a PCN reaction causing severe rash involving mucus membranes or skin necrosis: No Has patient had a PCN reaction that required hospitalization: No Has patient had a PCN reaction occurring within the last 10 years: No If all of the above answers are "NO", then may proceed with Cephalosporin use. THE PATIENT IS ABLE TO TOLERATE CEPHALOSPORINS WITHOUT DIFFIC  . Sulfa Antibiotics Hives and Rash    Family History  Problem Relation Age of Onset  . Asthma Mother   . Diabetes Mother   . Hyperlipidemia Mother   . Hypertension Mother   . Diabetes Father   . Hyperlipidemia Father   . Hypertension Father   . Diabetes Brother   . Depression Brother   . Alcohol abuse Brother   . Depression Maternal Grandmother   . Stroke Paternal Grandmother   . Breast cancer Neg Hx      Social History Social History   Tobacco Use  . Smoking status: Never Smoker  . Smokeless tobacco: Never Used  Substance Use Topics  . Alcohol use: No  . Drug use: No    Review of Systems Constitutional: Negative for fever. Cardiovascular: Negative for chest pain. Respiratory: Negative for shortness of breath. Gastrointestinal: Right lower quadrant abdominal pain. Genitourinary: Negative for urinary compaints Skin: Negative for skin complaints  Neurological: Negative for headache All other ROS negative  ____________________________________________   PHYSICAL EXAM:  VITAL SIGNS: ED Triage Vitals  Enc Vitals Group     BP 05/24/19 0817 122/85     Pulse Rate 05/24/19 0817 (!) 110     Resp 05/24/19 0817 16     Temp 05/24/19 0817 98.1 F (36.7 C)     Temp Source 05/24/19 0817 Oral     SpO2 05/24/19 0817 100 %     Weight 05/24/19 0801 285 lb (129.3 kg)     Height 05/24/19 0801 5' 6" (1.676 m)  Head Circumference --      Peak Flow --      Pain Score 05/24/19 0801 10     Pain Loc --      Pain Edu? --      Excl. in [place]? --     Constitutional: Alert and oriented. Well appearing and in no distress. Eyes: Normal exam ENT      Head: Normocephalic and atraumatic.      Mouth/Throat: Mucous membranes are moist. Cardiovascular: Normal rate, regular rhythm.  Respiratory: Normal respiratory effort without tachypnea nor retractions. Breath sounds are clear Gastrointestinal: Soft mild right lower quadrant tenderness.  No rebound guarding or distention.  Obese abdomen. Musculoskeletal: Nontender with normal range of motion in all extremities.  Neurologic:  Normal speech and language. No gross focal neurologic deficits  Skin:  Skin is warm, dry and intact.  Psychiatric: Mood and affect are normal.   ____________________________________________   INITIAL IMPRESSION / ASSESSMENT AND PLAN / ED COURSE  Pertinent labs & imaging results that were available during my care of  the patient were reviewed by me and considered in my medical decision making (see chart for details).   Patient presents to the emergency department for continued lower abdominal pain which has been ongoing since December of this past year although worse over the past 1 week per patient.  Patient had a CT scan performed 05/18/2019 with largely normal results.  Patient's lab work today is reassuring.  Patient had a urine culture performed 05/18/2019 showing no obvious infection denies any urinary symptoms.  Given the patient's recent negative CT with reassuring lab work I do not believe repeat CT imaging would be of much benefit to the patient at this time and patient agrees.  I discussed with the patient need to follow back up with Dr. Leafy Ro of OB/GYN to discuss further work-up and management of her discomfort.  We will discharge with a short course of pain medication.  Patient was unable to provide a urine sample during her ED visit.  Patient states she no longer wishes to wait and wants to go home.  Given her negative culture 05/18/2019 I believe this is reasonable.  CANDANCE BOHLMAN was evaluated in Emergency Department on 05/24/2019 for the symptoms described in the history of present illness. She was evaluated in the context of the global COVID-19 pandemic, which necessitated consideration that the patient might be at risk for infection with the SARS-CoV-2 virus that causes COVID-19. Institutional protocols and algorithms that pertain to the evaluation of patients at risk for COVID-19 are in a state of rapid change based on information released by regulatory bodies including the CDC and federal and state organizations. These policies and algorithms were followed during the patient's care in the ED.  ____________________________________________   FINAL CLINICAL IMPRESSION(S) / ED DIAGNOSES  Lower abdominal pain   Harvest Dark, MD 05/24/19 1309

## 2019-06-01 NOTE — H&P (Signed)
Joan [name] is a 38 y.o. [id number] here for female here for Pelvic Pain (hysterectomy 07/2018, imaging unremarkable)  History of Present Illness: Severe RLQ pain onset 2 weeks ago. Pain so severe a few days ago, patient went to ER. Location:  From umbilicus to RLQ, occasionally to lower right back Quality:  Sharp, stabbing pain at RLQ; pulling at the belly button Severity: Severe, 10/10 pain; patient is tearful  Duration: 2 weeks Timing:  Constant Context: TLH with BS and R Oophorectomy 07/2018; Kidney stones 01/2019, bowels are normal, decreased appetite  Modifying factors: Nothing makes it actually feel better. Can't sleep on that side, hurts more when she moves a lot. Tylenol 3 makes her chest hurt and does not resolve pain, Vicodin kind of works for about 3 hours, Dilaudid helped slightly, Pt felt Gabapentin did not help her at all Associated signs and symptoms: Nausea, has not vomited yet   Patient is not taking Micronor anymore, has not taken since about 4 months ago  Treatments and evaluations:   Family medicine at [facility] visit 05/18/2019 for RLQ pain: 10/10 sharp stabbing pain, Aleve without improvement, worse with urination, no alleviating factors, +nausea, right-sided pain with urination, urinary retention. Patient did have a kidney stone in 02/2019 and reports this pain is worse.  UA was negative for infection, CBC, CMP do not reveal findings related to her pain.  CT scan 05/2019: No urinary abnormalities. A cause for patient's symptoms has not been established with this study.  Patient's last menstrual period was 07/24/2018.   Pertinent Hx: -Uncomplicated TLH BS and R Oophorectomy on 07/31/2018 for menorrhagia with irregular cycle in setting of PCOS -Post op RLQ abdominal pain; given Cyclobenzaprine, rec PFPT, discussed trigger point injections -Hormonal mood swings reported 01/2019: We decided on a 3 month trial Micronor (patient has HTN). Noted that if no  improvement, we will trial a luteal phase antidepressant, which has worked in the past.  -2019 LGSIL pap, colpo benign -Hx of abdominal obesity, poorly controlled diabetes -Hx of lap chole, lap right ovarian cystectomy -Hx of HTN, controlled with antihypertensives  TLH BS w/ R. Oophorectomy Info: Operative findings:Enlarged mobile uterus, normal bilateral tubes and ovaries. Right bowel adhesions to the right ovary with the appendix adherent into the pelvis. Very long cervix.   Pathology: DIAGNOSIS: A. UTERUS WITH CERVIX, BILATERAL FALLOPIAN TUBES AND RIGHT OVARY; HYSTERECTOMY, BILATERAL SALPINGECTOMY AND RIGHT OOPHORECTOMY: - BENIGN ECTOCERVICAL AND ENDOCERVICAL MUCOSA WITH SQUAMOUS METAPLASIA AND CHRONIC ENDOCERVICITIS. - BENIGN PROLIFERATIVE ENDOMETRIUM WITH STROMAL HEMORRHAGE AND BENIGN MYOMETRIUM. - BENIGN RIGHT OVARY WITH HEMORRHAGIC CORPUS LUTEUM, BENIGN FOLLICLE CYSTS AND SEROSAL FIBROUS ADHESIONS. - BILATERAL BENIGN FALLOPIAN TUBES.  Past Medical History:  has a past medical history of Diabetes mellitus type 2, uncomplicated (CMS-HCC) and Hypertension.  Past Surgical History:  has a past surgical history that includes Tonsillectomy & Adenoidectomy; removal ovarian cyst (Right); Hysterectomy (07/2018); and Oophorectomy (07/2018). Family History: family history includes Diabetes in her father and mother; High blood pressure (Hypertension) in her father and mother. Social History:  reports that she has never smoked. She has never used smokeless tobacco. She reports that she does not drink alcohol or use drugs. OB/GYN History:  OB History    Gravida  10   Para      Term      Preterm      AB  10   Living        SAB  9   TAB      Ectopic  Molar      Multiple      Live Births           Allergies: is allergic to ciprofloxacin; metformin; ibuprofen; penicillin; penicillins; and sulfa (sulfonamide antibiotics). Medications:  Current Outpatient  Medications:  .  albuterol (PROVENTIL HFA) 90 mcg/actuation inhaler, Inhale 2 inhalations into the lungs every 6 (six) hours, Disp: , Rfl:  .  insulin GLARGINE (LANTUS SOLOSTAR U-100 INSULIN) pen injector (concentration 100 units/mL), Inject 14 Units subcutaneously once daily, Disp: , Rfl:  .  TRULICITY 0.75 mg/0.5 mL PnIj, INJ 0.75MG  [place] ONCE WEEKLY, Disp: , Rfl: 1 .  ADVAIR DISKUS 250-50 mcg/dose diskus inhaler, INL 1 PUFF ITL TWICE DAILY. RM AFTER U, Disp: , Rfl: 2 .  albuterol 90 mcg/actuation inhaler, Inhale 2 inhalations into the lungs every 6 (six) hours as needed for Wheezing or Shortness of Breath, Disp: 1 Inhaler, Rfl: 0 .  ALPRAZolam (XANAX) 0.5 MG tablet, Take 1 tab po 30 min prior to arrival and then take 1 tab po q day prn (Patient not taking: Reported on 01/11/2019  ), Disp: 4 tablet, Rfl: 0 .  ARIPiprazole (ABILIFY) 10 MG tablet, , Disp: , Rfl:  .  atorvastatin (LIPITOR) 40 MG tablet, Take 40 mg by mouth nightly, Disp: , Rfl:  .  azelastine (ASTELIN) 137 mcg nasal spray, Place 1 spray into both nostrils 2 (two) times daily (Patient not taking: Reported on 05/27/2019  ), Disp: 10 mL, Rfl: 1 .  BD ULTRA-FINE MINI PEN NEEDLE 31 gauge x 3/16" needle, U UTD WITH INSULIN, Disp: , Rfl: 2 .  chlorhexidine (PERIDEX) 0.12 % solution, RINSE WITH 15 ML PO BID FOR 2 WEEKS, Disp: , Rfl: 1 .  citalopram (CELEXA) 10 MG tablet, Take 10 mg by mouth once daily, Disp: , Rfl:  .  cyclobenzaprine (FLEXERIL) 10 MG tablet, Take 1/2 to 1 whole tab every 8 hours as needed (Patient not taking: Reported on 01/11/2019  ), Disp: 30 tablet, Rfl: 0 .  cyclobenzaprine (FLEXERIL) 10 MG tablet, Take 1 tablet (10 mg total) by mouth 3 (three) times daily as needed for Muscle spasms for up to 10 days (Patient not taking: Reported on 05/27/2019  ), Disp: 30 tablet, Rfl: 0 .  doxepin (SINEQUAN) 10 MG capsule, TAKE 1 TO 2 CAPSULES(10 TO 20 MG) BY MOUTH AT BEDTIME FOR SLEEP, Disp: , Rfl:  .  empagliflozin (JARDIANCE) 25 mg Tab  tablet, Take by mouth, Disp: , Rfl:  .  EPINEPHrine (EPIPEN) 0.3 mg/0.3 mL pen injector, INJECT INTRAMUSCULARLY AS DIRECTED, Disp: , Rfl: 0 .  fluticasone propionate (FLONASE) 50 mcg/actuation nasal spray, Place 2 sprays into both nostrils once daily (Patient not taking: Reported on 05/27/2019  ), Disp: 16 g, Rfl: 1 .  gabapentin (NEURONTIN) 300 MG capsule, Take 3 capsules (900 mg total) by mouth nightly for 14 days, Disp: 42 capsule, Rfl: 0 .  hydrOXYzine (ATARAX) 50 MG tablet, Take 50 mg by mouth nightly, Disp: , Rfl:  .  inhalational spacer (AEROCHAMBER WITH FLOWSIGNAL) spacer, Use as instructed. Generic spacer if possible, Disp: , Rfl:  .  ipratropium-albuterol (DUO-NEB) nebulizer solution, Inhale into the lungs, Disp: , Rfl:  .  ketoconazole (NIZORAL) 200 mg tablet, Take 1 tablet (200 mg total) by mouth once daily (Patient not taking: Reported on 01/11/2019  ), Disp: 30 tablet, Rfl: 0 .  loperamide (IMODIUM A-D) 2 mg tablet, Take 2 capsules after first loose stool, then one capsule after each following loose stool. Do  not exceed 16 mg per day. (Patient not taking: Reported on 05/27/2019  ), Disp: 20 tablet, Rfl: 0 .  loratadine (CLARITIN) 10 mg tablet, Take 10 mg by mouth once daily, Disp: , Rfl:  .  losartan (COZAAR) 50 MG tablet, Take 50 mg by mouth once daily, Disp: , Rfl:  .  medroxyPROGESTERone (PROVERA) 10 MG tablet, Take 1 tablet (10 mg total) by mouth once daily, Disp: 10 tablet, Rfl: 0 .  miscellaneous medical supply Misc, Dispense based on patient and insurance preference. Use up three times daily as directed. (FOR ICD-9 250.00, 250.01)., Disp: , Rfl:  .  montelukast (SINGULAIR) 10 mg tablet, , Disp: , Rfl: 3 .  norethindrone (MICRONOR) 0.35 mg tablet, Take 1 tablet (0.35 mg total) by mouth once daily (Patient not taking: Reported on 05/27/2019  ), Disp: 3 Package, Rfl: 4 .  nystatin (MYCOSTATIN) 100,000 unit/gram cream, Apply topically 2 (two) times daily (Patient not taking: Reported on  01/11/2019  ), Disp: 30 g, Rfl: 0 .  ondansetron (ZOFRAN) 4 MG tablet, , Disp: , Rfl: 0 .  prenatal vit-iron fum-folic ac (PRENAVITE) tablet, Take 1 tablet by mouth once daily, Disp: , Rfl:    Exam:   BP (!) 116/97   Pulse 105   Ht 167.6 cm (5\' 6" )   Wt (!) 130.2 kg (287 lb)   LMP 07/24/2018   BMI 46.32 kg/m   Constitutional:  General appearance: Well nourished, well developed female +acute distress, tearful due to pain  Neuro/psych:  Normal mood and affect. No gross motor deficits. Neck:  Supple, normal appearance.  Respiratory:  Normal respiratory effort, no use of accessory muscles Skin:  No visible rashes or external lesions  No CVA tenderness, no flank pain  Abdomen: soft , no mass, normal active bowel sounds,   +extreme RLQ tenderness, very tender to palpation on skin, no rebound tenderness, NO referred pain to the left side or lower pelvis  Pelvic: deferred  Impression:   The primary encounter diagnosis was Abdominal pain, RLQ (right lower quadrant). A diagnosis of Pelvic and perineal pain was also pertinent to this visit.  Plan:   1. RLQ Pain  -Discussed with patient that it is unlikely she has an infection, no fever, no vomiting, no elevated WBC. A possibility is adhesive disease, discussed etiology and discussed plan for a operative diagnostic laparoscopy. Discussed risks and details of the procedure. Patient expressed understanding. -Patient aware that we may not find an explanation for her pain in surgery but expresses that she would like to have the surgery. -She does not have insurance and would like to investigate the cost of surgery.  Will send in work note for patient.  Diagnoses and all orders for this visit:  Abdominal pain, RLQ (right lower quadrant)

## 2019-06-02 ENCOUNTER — Other Ambulatory Visit: Payer: Self-pay

## 2019-06-02 ENCOUNTER — Encounter
Admission: RE | Admit: 2019-06-02 | Discharge: 2019-06-02 | Disposition: A | Discharge status: Home / Self Care | Payer: Self-pay | Source: Ambulatory Visit | Attending: Obstetrics and Gynecology | Admitting: Obstetrics and Gynecology

## 2019-06-02 DIAGNOSIS — E118 Type 2 diabetes mellitus with unspecified complications: Secondary | ICD-10-CM | POA: Insufficient documentation

## 2019-06-02 DIAGNOSIS — Z01818 Encounter for other preprocedural examination: Secondary | ICD-10-CM | POA: Insufficient documentation

## 2019-06-02 DIAGNOSIS — I10 Essential (primary) hypertension: Secondary | ICD-10-CM | POA: Insufficient documentation

## 2019-06-02 HISTORY — DX: Tachycardia, unspecified: R00.0

## 2019-06-02 NOTE — Patient Instructions (Signed)
Your procedure is scheduled on: 06-07-19 MONDAY Report to Same Day Surgery 2nd floor medical mall [facility] Entrance-take elevator on left to 2nd floor.  Check in with surgery information desk.) To find out your arrival time please call [phone] between 1PM - 3PM on 06-04-19 FRIDAY  Remember: Instructions that are not followed completely may result in serious medical risk, up to and including death, or upon the discretion of your surgeon and anesthesiologist your surgery may need to be rescheduled.    _x___ 1. Do not eat food after midnight the night before your procedure. NO GUM OR CANDY AFTER MIDNIGHT. You may drink WATER up to 2 hours before you are scheduled to arrive at the hospital for your procedure.  Do not drink WATER within 2 hours of your scheduled arrival to the hospital.  Type 1 and type 2 diabetics should only drink water.   ____Ensure clear carbohydrate drink on the way to the hospital for bariatric patients  _X___GATORADE G2 drink 3 hours before surgery.    __x__ 2. No Alcohol for 24 hours before or after surgery.   __x__3. No Smoking or e-cigarettes for 24 prior to surgery.  Do not use any chewable tobacco products for at least 6 hour prior to surgery   ____  4. Bring all medications with you on the day of surgery if instructed.    __x__ 5. Notify your doctor if there is any change in your medical condition     (cold, fever, infections).    x___6. On the morning of surgery brush your teeth with toothpaste and water.  You may rinse your mouth with mouth wash if you wish.  Do not swallow any toothpaste or mouthwash.   Do not wear jewelry, make-up, hairpins, clips or nail polish.  Do not wear lotions, powders, or perfumes. You may wear deodorant.  Do not shave 48 hours prior to surgery. Men may shave face and neck.  Do not bring valuables to the hospital.    [facility] is not responsible for any belongings or valuables.               Contacts, dentures or  bridgework may not be worn into surgery.  Leave your suitcase in the car. After surgery it may be brought to your room.  For patients admitted to the hospital, discharge time is determined by your treatment team.  _  Patients discharged the day of surgery will not be allowed to drive home.  You will need someone to drive you home and stay with you the night of your procedure.    Please read over the following fact sheets that you were given:   [facility] Preparing for Surgery   _x___ TAKE THE FOLLOWING MEDICATION THE MORNING OF SURGERY WITH A SMALL SIP OF WATER. These include:  1. GABAPENTIN (NEURONTIN)  2.  3.  4.  5.  6.  ____Fleets enema or Magnesium Citrate as directed.   _x___ Use CHG Soap or sage wipes as directed on instruction sheet   _X___ Use inhalers on the day of surgery and bring to hospital day of surgery-USE YOUR ALBUTEROL INHALER DAY OF SURGERY AND [place]   ____ Stop Metformin and Janumet 2 days prior to surgery.    _X___ Take 1/2 of usual insulin dose the night before surgery and none on the morning surgery-TAKE HALF OF YOUR LANTUS Sunday NIGHT (25 UNITS) AND NO INSULIN THE MORNING OF SURGERY  ____ Follow recommendations from  Cardiologist, Pulmonologist or PCP regarding stopping Aspirin, Coumadin, Plavix ,Eliquis, Effient, or Pradaxa, and Pletal.  X____Stop Anti-inflammatories such as Advil, Aleve, Ibuprofen, Motrin, Naproxen, Naprosyn, Goodies powders or aspirin products NOW-OK to take Tylenol    ____ Stop supplements until after surgery.     ____ Bring C-Pap to the hospital.

## 2019-06-03 ENCOUNTER — Other Ambulatory Visit: Payer: Self-pay

## 2019-06-03 ENCOUNTER — Encounter
Admission: RE | Admit: 2019-06-03 | Discharge: 2019-06-03 | Disposition: A | Discharge status: Home / Self Care | Payer: Self-pay | Source: Ambulatory Visit | Attending: Obstetrics and Gynecology | Admitting: Obstetrics and Gynecology

## 2019-06-03 ENCOUNTER — Other Ambulatory Visit: Admission: RE | Admit: 2019-06-03 | Payer: Self-pay | Source: Ambulatory Visit

## 2019-06-03 DIAGNOSIS — Z01818 Encounter for other preprocedural examination: Secondary | ICD-10-CM | POA: Insufficient documentation

## 2019-06-03 DIAGNOSIS — I10 Essential (primary) hypertension: Secondary | ICD-10-CM | POA: Insufficient documentation

## 2019-06-03 DIAGNOSIS — Z20828 Contact with and (suspected) exposure to other viral communicable diseases: Secondary | ICD-10-CM | POA: Insufficient documentation

## 2019-06-03 DIAGNOSIS — E118 Type 2 diabetes mellitus with unspecified complications: Secondary | ICD-10-CM | POA: Insufficient documentation

## 2019-06-03 LAB — CBC
HCT: 41 % (ref 36.0–46.0)
Hemoglobin: 13.4 g/dL (ref 12.0–15.0)
MCH: 27.5 pg (ref 26.0–34.0)
MCHC: 32.7 g/dL (ref 30.0–36.0)
MCV: 84 fL (ref 80.0–100.0)
Platelets: 240 10*3/uL (ref 150–400)
RBC: 4.88 MIL/uL (ref 3.87–5.11)
RDW: 13.1 % (ref 11.5–15.5)
WBC: 4.9 10*3/uL (ref 4.0–10.5)
nRBC: 0 % (ref 0.0–0.2)

## 2019-06-03 LAB — TYPE AND SCREEN
ABO/RH(D): A POS
Antibody Screen: NEGATIVE

## 2019-06-03 LAB — BASIC METABOLIC PANEL
Anion gap: 10 (ref 5–15)
BUN: 18 mg/dL (ref 6–20)
CO2: 28 mmol/L (ref 22–32)
Calcium: 9 mg/dL (ref 8.9–10.3)
Chloride: 99 mmol/L (ref 98–111)
Creatinine, Ser: 0.66 mg/dL (ref 0.44–1.00)
GFR calc Af Amer: >60 mL/min (ref >60)
GFR calc non Af Amer: >60 mL/min (ref >60)
Glucose, Bld: 164 mg/dL — ABNORMAL HIGH (ref 70–99)
Potassium: 4.4 mmol/L (ref 3.5–5.1)
Sodium: 137 mmol/L (ref 135–145)

## 2019-06-03 LAB — SARS CORONAVIRUS 2 (TAT 6-24 HRS): SARS Coronavirus 2: NEGATIVE

## 2019-06-07 ENCOUNTER — Ambulatory Visit
Admission: RE | Admit: 2019-06-07 | Discharge: 2019-06-07 | Disposition: A | Discharge status: Home / Self Care | Payer: Self-pay | Source: Ambulatory Visit | Attending: Obstetrics and Gynecology | Admitting: Obstetrics and Gynecology

## 2019-06-07 ENCOUNTER — Ambulatory Visit: Payer: Self-pay | Admitting: Registered Nurse

## 2019-06-07 ENCOUNTER — Encounter
Admission: RE | Disposition: A | Discharge status: Home / Self Care | Payer: Self-pay | Source: Ambulatory Visit | Attending: Obstetrics and Gynecology

## 2019-06-07 ENCOUNTER — Other Ambulatory Visit: Payer: Self-pay

## 2019-06-07 ENCOUNTER — Encounter: Payer: Self-pay | Admitting: *Deleted

## 2019-06-07 DIAGNOSIS — R1031 Right lower quadrant pain: Secondary | ICD-10-CM

## 2019-06-07 DIAGNOSIS — Z888 Allergy status to other drugs, medicaments and biological substances status: Secondary | ICD-10-CM | POA: Insufficient documentation

## 2019-06-07 DIAGNOSIS — E119 Type 2 diabetes mellitus without complications: Secondary | ICD-10-CM | POA: Diagnosis not present

## 2019-06-07 DIAGNOSIS — Z7951 Long term (current) use of inhaled steroids: Secondary | ICD-10-CM | POA: Diagnosis not present

## 2019-06-07 DIAGNOSIS — Z79899 Other long term (current) drug therapy: Secondary | ICD-10-CM | POA: Insufficient documentation

## 2019-06-07 DIAGNOSIS — R102 Pelvic and perineal pain: Secondary | ICD-10-CM | POA: Diagnosis present

## 2019-06-07 DIAGNOSIS — Z6841 Body Mass Index (BMI) 40.0 and over, adult: Secondary | ICD-10-CM | POA: Insufficient documentation

## 2019-06-07 DIAGNOSIS — J45909 Unspecified asthma, uncomplicated: Secondary | ICD-10-CM | POA: Diagnosis not present

## 2019-06-07 DIAGNOSIS — I10 Essential (primary) hypertension: Secondary | ICD-10-CM | POA: Insufficient documentation

## 2019-06-07 DIAGNOSIS — Z881 Allergy status to other antibiotic agents status: Secondary | ICD-10-CM | POA: Diagnosis not present

## 2019-06-07 DIAGNOSIS — N736 Female pelvic peritoneal adhesions (postinfective): Secondary | ICD-10-CM | POA: Insufficient documentation

## 2019-06-07 DIAGNOSIS — Z880 Allergy status to penicillin: Secondary | ICD-10-CM | POA: Diagnosis not present

## 2019-06-07 DIAGNOSIS — Z886 Allergy status to analgesic agent status: Secondary | ICD-10-CM | POA: Diagnosis not present

## 2019-06-07 DIAGNOSIS — Z882 Allergy status to sulfonamides status: Secondary | ICD-10-CM | POA: Diagnosis not present

## 2019-06-07 DIAGNOSIS — Z794 Long term (current) use of insulin: Secondary | ICD-10-CM | POA: Insufficient documentation

## 2019-06-07 HISTORY — PX: LAPAROSCOPY: SHX197

## 2019-06-07 LAB — GLUCOSE, CAPILLARY
Glucose-Capillary: 142 mg/dL — ABNORMAL HIGH (ref 70–99)
Glucose-Capillary: 117 mg/dL — ABNORMAL HIGH (ref 70–99)

## 2019-06-07 SURGERY — LAPAROSCOPY OPERATIVE
Anesthesia: General

## 2019-06-07 MED ORDER — BUPIVACAINE HCL (PF) 0.5 % IJ SOLN
INTRAMUSCULAR | Status: AC
  Filled 2019-06-07: qty 30

## 2019-06-07 MED ORDER — ROCURONIUM BROMIDE 100 MG/10ML IV SOLN
INTRAVENOUS | Status: DC | PRN
  Administered 2019-06-07: 20 mg via INTRAVENOUS
  Administered 2019-06-07: 30 mg via INTRAVENOUS
  Administered 2019-06-07: 20 mg via INTRAVENOUS

## 2019-06-07 MED ORDER — LIDOCAINE HCL (PF) 2 % IJ SOLN
INTRAMUSCULAR | Status: AC
  Filled 2019-06-07: qty 10

## 2019-06-07 MED ORDER — MIDAZOLAM HCL 2 MG/2ML IJ SOLN
2.0000 mg | Freq: Once | INTRAMUSCULAR | Status: AC
  Administered 2019-06-07: 2 mg via INTRAVENOUS

## 2019-06-07 MED ORDER — FENTANYL CITRATE (PF) 100 MCG/2ML IJ SOLN
INTRAMUSCULAR | Status: AC
  Filled 2019-06-07: qty 2

## 2019-06-07 MED ORDER — ONDANSETRON HCL 4 MG/2ML IJ SOLN
INTRAMUSCULAR | Status: DC | PRN
  Administered 2019-06-07: 4 mg via INTRAVENOUS

## 2019-06-07 MED ORDER — SUCCINYLCHOLINE CHLORIDE 20 MG/ML IJ SOLN
INTRAMUSCULAR | Status: DC | PRN
  Administered 2019-06-07: 140 mg via INTRAVENOUS

## 2019-06-07 MED ORDER — MIDAZOLAM HCL 2 MG/2ML IJ SOLN
INTRAMUSCULAR | Status: DC | PRN
  Administered 2019-06-07: 2 mg via INTRAVENOUS

## 2019-06-07 MED ORDER — ONDANSETRON HCL 4 MG/2ML IJ SOLN
4.0000 mg | Freq: Once | INTRAMUSCULAR | Status: AC | PRN
  Administered 2019-06-07: 4 mg via INTRAVENOUS

## 2019-06-07 MED ORDER — FENTANYL CITRATE (PF) 100 MCG/2ML IJ SOLN
25.0000 ug | INTRAMUSCULAR | Status: DC | PRN
  Administered 2019-06-07 (×4): 25 ug via INTRAVENOUS

## 2019-06-07 MED ORDER — LIDOCAINE HCL (CARDIAC) PF 100 MG/5ML IV SOSY
PREFILLED_SYRINGE | INTRAVENOUS | Status: DC | PRN
  Administered 2019-06-07: 100 mg via INTRAVENOUS

## 2019-06-07 MED ORDER — PROPOFOL 10 MG/ML IV BOLUS
INTRAVENOUS | Status: AC
  Filled 2019-06-07: qty 20

## 2019-06-07 MED ORDER — SODIUM CHLORIDE 0.9 % IV SOLN
INTRAVENOUS | Status: DC
  Administered 2019-06-07: 11:00:00 via INTRAVENOUS

## 2019-06-07 MED ORDER — DEXAMETHASONE SODIUM PHOSPHATE 10 MG/ML IJ SOLN
INTRAMUSCULAR | Status: AC
  Filled 2019-06-07: qty 1

## 2019-06-07 MED ORDER — HYDROCODONE-ACETAMINOPHEN 5-325 MG PO TABS: 1.0000 | ORAL_TABLET | ORAL | 0 refills | Status: DC | PRN

## 2019-06-07 MED ORDER — ROCURONIUM BROMIDE 50 MG/5ML IV SOLN
INTRAVENOUS | Status: AC
  Filled 2019-06-07: qty 1

## 2019-06-07 MED ORDER — GABAPENTIN 100 MG PO CAPS: 400.0000 mg | ORAL_CAPSULE | Freq: Three times a day (TID) | ORAL | 0 refills | Status: DC

## 2019-06-07 MED ORDER — ONDANSETRON HCL 4 MG/2ML IJ SOLN: 4.0000 mg | Freq: Once | INTRAMUSCULAR | Status: DC | PRN

## 2019-06-07 MED ORDER — BUPIVACAINE HCL 0.5 % IJ SOLN
INTRAMUSCULAR | Status: DC | PRN
  Administered 2019-06-07: 5 mL

## 2019-06-07 MED ORDER — FENTANYL CITRATE (PF) 100 MCG/2ML IJ SOLN
INTRAMUSCULAR | Status: DC | PRN
  Administered 2019-06-07 (×4): 50 ug via INTRAVENOUS

## 2019-06-07 MED ORDER — MIDAZOLAM HCL 2 MG/2ML IJ SOLN
INTRAMUSCULAR | Status: AC
  Filled 2019-06-07: qty 2

## 2019-06-07 MED ORDER — PHENYLEPHRINE HCL (PRESSORS) 10 MG/ML IV SOLN
INTRAVENOUS | Status: DC | PRN
  Administered 2019-06-07: 100 ug via INTRAVENOUS

## 2019-06-07 MED ORDER — FENTANYL CITRATE (PF) 100 MCG/2ML IJ SOLN: 25.0000 ug | INTRAMUSCULAR | Status: DC | PRN

## 2019-06-07 MED ORDER — SUGAMMADEX SODIUM 200 MG/2ML IV SOLN
INTRAVENOUS | Status: DC | PRN
  Administered 2019-06-07: 200 mg via INTRAVENOUS

## 2019-06-07 MED ORDER — LACTATED RINGERS IV SOLN: INTRAVENOUS | Status: DC

## 2019-06-07 MED ORDER — FAMOTIDINE 20 MG PO TABS
20.0000 mg | ORAL_TABLET | Freq: Once | ORAL | Status: AC
  Administered 2019-06-07: 20 mg via ORAL

## 2019-06-07 MED ORDER — PROPOFOL 10 MG/ML IV BOLUS
INTRAVENOUS | Status: DC | PRN
  Administered 2019-06-07: 200 mg via INTRAVENOUS

## 2019-06-07 MED ORDER — DEXAMETHASONE SODIUM PHOSPHATE 10 MG/ML IJ SOLN
INTRAMUSCULAR | Status: DC | PRN
  Administered 2019-06-07: 10 mg via INTRAVENOUS

## 2019-06-07 MED ORDER — LACTATED RINGERS IV SOLN
INTRAVENOUS | Status: DC | PRN
  Administered 2019-06-07: 13:00:00 via INTRAVENOUS

## 2019-06-07 MED ORDER — ONDANSETRON HCL 4 MG/2ML IJ SOLN
INTRAMUSCULAR | Status: AC
  Filled 2019-06-07: qty 2

## 2019-06-07 MED ORDER — FAMOTIDINE 20 MG PO TABS
ORAL_TABLET | ORAL | Status: AC
  Filled 2019-06-07: qty 1

## 2019-06-07 MED ORDER — DEXMEDETOMIDINE HCL 200 MCG/2ML IV SOLN
INTRAVENOUS | Status: DC | PRN
  Administered 2019-06-07: 4 ug via INTRAVENOUS
  Administered 2019-06-07 (×2): 8 ug via INTRAVENOUS

## 2019-06-07 SURGICAL SUPPLY — 52 items
ADH SKN CLS APL DERMABOND .7 (GAUZE/BANDAGES/DRESSINGS) ×1
APL PRP STRL LF DISP 70% ISPRP (MISCELLANEOUS) ×1
BAG DRN RND TRDRP ANRFLXCHMBR (UROLOGICAL SUPPLIES) ×1
BAG SPEC RTRVL LRG 6X4 10 (ENDOMECHANICALS)
BAG URINE DRAIN 2000ML AR STRL (UROLOGICAL SUPPLIES) ×2 IMPLANT
BLADE SURG SZ11 CARB STEEL (BLADE) ×2 IMPLANT
CATH FOLEY 2WAY  5CC 16FR (CATHETERS) ×1
CATH FOLEY 2WAY 5CC 16FR (CATHETERS) ×1
CATH URTH 16FR FL 2W BLN LF (CATHETERS) ×1 IMPLANT
CHLORAPREP W/TINT 26 (MISCELLANEOUS) ×2 IMPLANT
COVER WAND RF STERILE (DRAPES) IMPLANT
DERMABOND ADVANCED (GAUZE/BANDAGES/DRESSINGS) ×1
DERMABOND ADVANCED .7 DNX12 (GAUZE/BANDAGES/DRESSINGS) ×1 IMPLANT
DRAPE GENERAL ENDO 106X123.5 (DRAPES) ×2 IMPLANT
DRAPE LEGGINS SURG 28X43 STRL (DRAPES) ×2 IMPLANT
DRAPE STERI POUCH LG 24X46 STR (DRAPES) ×1 IMPLANT
DRAPE UNDER BUTTOCK W/FLU (DRAPES) ×2 IMPLANT
GLOVE BIO SURGEON STRL SZ7 (GLOVE) ×4 IMPLANT
GLOVE INDICATOR 7.5 STRL GRN (GLOVE) ×2 IMPLANT
GOWN STRL REUS W/ TWL LRG LVL3 (GOWN DISPOSABLE) ×2 IMPLANT
GOWN STRL REUS W/TWL LRG LVL3 (GOWN DISPOSABLE) ×4
IRRIGATION STRYKERFLOW (MISCELLANEOUS) IMPLANT
IRRIGATOR STRYKERFLOW (MISCELLANEOUS)
IV NS 1000ML (IV SOLUTION) ×2
IV NS 1000ML BAXH (IV SOLUTION) ×1 IMPLANT
KIT PINK PAD W/HEAD ARE REST (MISCELLANEOUS) ×2
KIT PINK PAD W/HEAD ARM REST (MISCELLANEOUS) ×1 IMPLANT
KIT TURNOVER CYSTO (KITS) ×2 IMPLANT
LABEL OR SOLS (LABEL) ×2 IMPLANT
LIGASURE LAP MARYLAND 5MM 37CM (ELECTROSURGICAL) IMPLANT
LIGASURE VESSEL 5MM BLUNT TIP (ELECTROSURGICAL) IMPLANT
NDL FILTER BLUNT 18X1 1/2 (NEEDLE) ×1 IMPLANT
NEEDLE FILTER BLUNT 18X 1/2SAF (NEEDLE) ×1
NEEDLE FILTER BLUNT 18X1 1/2 (NEEDLE) ×1 IMPLANT
NS IRRIG 500ML POUR BTL (IV SOLUTION) ×2 IMPLANT
PACK GYN LAPAROSCOPIC (MISCELLANEOUS) ×2 IMPLANT
PAD OB MATERNITY 4.3X12.25 (PERSONAL CARE ITEMS) ×2 IMPLANT
PAD PREP 24X41 OB/GYN DISP (PERSONAL CARE ITEMS) ×2 IMPLANT
POUCH SPECIMEN RETRIEVAL 10MM (ENDOMECHANICALS) IMPLANT
SCISSORS METZENBAUM CVD 33 (INSTRUMENTS) IMPLANT
SET TUBE SMOKE EVAC HIGH FLOW (TUBING) ×2 IMPLANT
SLEEVE ENDOPATH XCEL 5M (ENDOMECHANICALS) ×2 IMPLANT
STRIP CLOSURE SKIN 1/4X4 (GAUZE/BANDAGES/DRESSINGS) ×2 IMPLANT
SUT MNCRL AB 4-0 PS2 18 (SUTURE) ×2 IMPLANT
SUT VIC AB 2-0 UR6 27 (SUTURE) ×2 IMPLANT
SUT VIC AB 4-0 SH 27 (SUTURE) ×2
SUT VIC AB 4-0 SH 27XANBCTRL (SUTURE) ×1 IMPLANT
SYR 50ML LL SCALE MARK (SYRINGE) IMPLANT
SYR 5ML LL (SYRINGE) ×2 IMPLANT
TROCAR 5M 150ML BLDLS (TROCAR) IMPLANT
TROCAR XCEL NON-BLD 5MMX100MML (ENDOMECHANICALS) ×2 IMPLANT
TUBING ART PRESS 48 MALE/FEM (TUBING) IMPLANT

## 2019-06-07 NOTE — Op Note (Signed)
Joan [name] PROCEDURE DATE: 06/07/2019  PREOPERATIVE DIAGNOSIS: Acute pelvic pain POSTOPERATIVE DIAGNOSIS: Pelvic adhesive disease PROCEDURE: Operative laparoscopy, left utero-sacral biopsy, lysis of adhesions SURGEON:  Dr. Benjaman Kindler ASSISTANT: CST ANESTHESIOLOGIST: No responsible provider has been recorded for the case. Anesthesiologist: Durenda Hurt, MD CRNA: Debe Coder, CRNA; Caryl Asp, CRNA  INDICATIONS: 38 y.o. F with a 3 week hx of right lower quadrant pelvic pain desiring surgical evaluation. She has been found negative for kidney stone, appendicitis. She came to me with "tearing" and "knife-like" right sided pelvic pain, and requested dx lap for possible adhesions.   Please see preoperative notes for further details.  Risks of surgery were discussed with the patient including but not limited to: bleeding which may require transfusion or reoperation; infection which may require antibiotics; injury to bowel, bladder, ureters or other surrounding organs; need for additional procedures including laparotomy; thromboembolic phenomenon, incisional problems and other postoperative/anesthesia complications. Written informed consent was obtained.    FINDINGS: Absent urterus, right ovary. No pelvic adhesions. +minimal adhesions in RLQ, and normal appendix without adhesions. These were removed. Left [place] ligament nodule removed and sent to pathology. Mild adhesions between left ovary and sigmoid colon, not pathologic and left alone. No adhesions to the vaginal cuff, which was visualized well, and no evidence of endometriosis in posterior cul de sac. Peritoneal biopsies were taken and sent to pathology. No other abdominal/pelvic abnormality.  Normal upper abdomen.  ANESTHESIA:    General INTRAVENOUS FLUIDS: 300 ml ESTIMATED BLOOD LOSS: minimal URINE OUTPUT: 600 ml SPECIMENS: Peritoneal biopsies at left uterosacral ligament COMPLICATIONS: None immediate  PROCEDURE IN  DETAIL:  The patient had sequential compression devices applied to her lower extremities while in the preoperative area.  She was then taken to the operating room where general anesthesia was administered and was found to be adequate.  She was placed in the dorsal lithotomy position, and was prepped and draped in a sterile manner.  A Foley catheter was inserted into her bladder and attached to constant drainage and a sponge stick placed in the vagina.  After an adequate timeout was performed, attention was turned to the abdomen where an umbilical incision was made with the scalpel.  The Optiview 5-mm trocar and sleeve were then advanced without difficulty with the laparoscope under direct visualization into the abdomen.  The abdomen was then insufflated with carbon dioxide gas and adequate pneumoperitoneum was obtained.   A detailed survey of the patient's pelvis and abdomen revealed the findings as mentioned above.   Biopsy forceps were used to take peritoneal biopsies at the [place] ligament.    Adhesions were taken down bluntly in the RLQ. The appendix was noted to be normal and not adhesed.  The operative site was surveyed, and it was found to be hemostatic.  No intraoperative injury to surrounding organs was noted.  Pictures were taken of the quadrants and pelvis. The abdomen was desufflated and all instruments were then removed from the patient's abdomen. The uterine manipulator was removed without complications.  All incisions were closed with 4-0 Vicryl and Dermabond.   The patient tolerated the procedures well.  All instruments, needles, and sponge counts were correct x 2. The patient was taken to the recovery room in stable condition.

## 2019-06-07 NOTE — Transfer of Care (Signed)
Immediate Anesthesia Transfer of Care Note  Patient: Joan [name]  Procedure(s) Performed: LAPAROSCOPY DIAGNOSTIC (N/A ) LAPAROSCOPIC LYSIS OF ADHESIONS (N/A )  Patient Location: PACU  Anesthesia Type:General  Level of Consciousness: awake and drowsy  Airway & Oxygen Therapy: Patient Spontanous Breathing and Patient connected to face mask oxygen  Post-op Assessment: Report given to RN and Post -op Vital signs reviewed and stable  Post vital signs: Reviewed and stable  Last Vitals:  Vitals Value Taken Time  BP 156/105 06/07/19 1413  Temp 36.9 C 06/07/19 1413  Pulse 119 06/07/19 1419  Resp 18 06/07/19 1419  SpO2 99 % 06/07/19 1419  Vitals shown include unvalidated device data.  Last Pain:  Vitals:   06/07/19 1413  TempSrc:   PainSc: Asleep         Complications: No apparent anesthesia complications

## 2019-06-07 NOTE — Discharge Instructions (Signed)
AMBULATORY SURGERY  DISCHARGE INSTRUCTIONS   1) The drugs that you were given will stay in your system until tomorrow so for the next 24 hours you should not:  A) Drive an automobile B) Make any legal decisions C) Drink any alcoholic beverage   2) You may resume regular meals tomorrow.  Today it is better to start with liquids and gradually work up to solid foods.  You may eat anything you prefer, but it is better to start with liquids, then soup and crackers, and gradually work up to solid foods.   3) Please notify your doctor immediately if you have any unusual bleeding, trouble breathing, redness and pain at the surgery site, drainage, fever, or pain not relieved by medication.    4) Additional Instructions:        Please contact your physician with any problems or Same Day Surgery at [phone], Monday through Friday 6 am to 4 pm, or [place] at [facility] number at [phone].Laparoscopic Ovarian Surgery Discharge Instructions  For the next three days, take it easy and left your body heal. I will refill your gabapentin for nighttime, to help you sleep and also to control pain. Take gabapentin medicines at night for at least the next 3 nights. You also have a narcotic, vicodin, to take as needed if the above medicine doesn't help.  Postop constipation is a major cause of pain. Stay well hydrated, walk as you tolerate, and take over the counter senna as well as stool softeners if you need them.   RISKS AND COMPLICATIONS   Infection.  Bleeding.  Injury to surrounding organs.  Anesthetic side effects.   PROCEDURE   You may be given a medicine to help you relax (sedative) before the procedure. You will be given a medicine to make you sleep (general anesthetic) during the procedure.  A tube will be put down your throat to help your breath while under general anesthesia.  Several small cuts (incisions) are made in the lower abdominal area and one incision  is made near the belly button.  Your abdominal area will be inflated with a safe gas (carbon dioxide). This helps give the surgeon room to operate, visualize, and helps the surgeon avoid other organs.  A thin, lighted tube (laparoscope) with a camera attached is inserted into your abdomen through the incision near the belly button. Other small instruments may also be inserted through other abdominal incisions.  The ovary is located and are removed.  After the ovary is removed, the gas is released from the abdomen.  The incisions will be closed with stitches (sutures), and Dermabond. A bandage may be placed over the incisions.  AFTER THE PROCEDURE   You will also have some mild abdominal discomfort for 3-7 days. You will be given pain medicine to ease any discomfort.  As long as there are no problems, you may be allowed to go home. Someone will need to drive you home and be with you for at least 24 hours once home.  You may have some mild discomfort in the throat. This is from the tube placed in your throat while you were sleeping.  You may experience discomfort in the shoulder area from some trapped air between the liver and diaphragm. This sensation is normal and will slowly go away on its own.  HOME CARE INSTRUCTIONS   Take all medicines as directed.  Only take over-the-counter or prescription medicines for pain, discomfort, or fever as directed by your caregiver.  Resume  daily activities as directed.  Showers are preferred over baths for 2 weeks.  You may resume sexual activities in 1 week or as you feel you would like to.  Do not drive while taking narcotics.  SEEK MEDICAL CARE IF: .  There is increasing abdominal pain.  You feel lightheaded or faint.  You have the chills.  You have an oral temperature above 102 F (38.9 C).  There is pus-like (purulent) drainage from any of the wounds.  You are unable to pass gas or have a bowel movement.  You feel sick to your  stomach (nauseous) or throw up (vomit) and can't control it with your medicines.  MAKE SURE YOU:   Understand these instructions.  Will watch your condition.  Will get help right away if you are not doing well or get worse.  ExitCare Patient Information 2013 [place], [place].

## 2019-06-07 NOTE — Anesthesia Preprocedure Evaluation (Signed)
Anesthesia Evaluation  Patient identified by MRN, date of birth, ID band Patient awake    Reviewed: Allergy & Precautions, H&P , NPO status , Patient's Chart, lab work & pertinent test results, reviewed documented beta blocker date and time   Airway Mallampati: II  TM Distance: >3 FB Neck ROM: full    Dental  (+) Teeth Intact   Pulmonary neg pulmonary ROS, shortness of breath and with exertion, asthma ,    Pulmonary exam normal        Cardiovascular Exercise Tolerance: Good hypertension, On Medications negative cardio ROS Normal cardiovascular exam Rhythm:regular Rate:Normal     Neuro/Psych PSYCHIATRIC DISORDERS Anxiety Depression negative neurological ROS  negative psych ROS   GI/Hepatic negative GI ROS, Neg liver ROS, GERD  Medicated,  Endo/Other  diabetes, Well Controlled, Type 1, Insulin DependentMorbid obesity  Renal/GU Renal diseasenegative Renal ROS  negative genitourinary   Musculoskeletal   Abdominal   Peds  Hematology negative hematology ROS (+)   Anesthesia Other Findings Past Medical History: No date: Asthma No date: Auditory hallucinations     Comment:  only after anesthesia No date: Diabetes mellitus without complication (HCC)     Comment:  diet controlled No date: Diabetes mellitus, type II (HCC)     Comment:  insulin, jardiance No date: Dyspnea     Comment:  with exertion No date: Essential hypertension 11/24/2017: Essential hypertension No date: GERD (gastroesophageal reflux disease)     Comment:  occasionally-NO MEDS No date: MDD (major depressive disorder) No date: Miscarriage No date: PTSD (post-traumatic stress disorder) No date: Tachycardia Past Surgical History: No date: ABDOMINAL HYSTERECTOMY No date: ADENOIDECTOMY 2009: CHOLECYSTECTOMY 07/31/2018: CYSTOSCOPY; N/A     Comment:  Procedure: CYSTOSCOPY;  Surgeon: Benjaman Kindler, MD;                Location: [facility];  Service:  Gynecology;  Laterality:               N/A; 07/31/2018: LAPAROSCOPIC BILATERAL SALPINGECTOMY; Bilateral     Comment:  Procedure: LAPAROSCOPIC BILATERAL SALPINGECTOMY;                Surgeon: Benjaman Kindler, MD;  Location: [facility];                Service: Gynecology;  Laterality: Bilateral; 07/31/2018: LAPAROSCOPIC HYSTERECTOMY; N/A     Comment:  Procedure: HYSTERECTOMY TOTAL LAPAROSCOPIC;  Surgeon:               Benjaman Kindler, MD;  Location: [facility];  Service:               Gynecology;  Laterality: N/A; 07/31/2018: LYSIS OF ADHESION; N/A     Comment:  Procedure: LYSIS OF ADHESION;  Surgeon: Benjaman Kindler, MD;  Location: [facility];  Service: Gynecology;                Laterality: N/A; No date: OVARY SURGERY; Right     Comment:  cyst removed a while ago No date: TONSILLECTOMY No date: tubes in ear BMI    Body Mass Index: 45.90 kg/m     Reproductive/Obstetrics negative OB ROS                             Anesthesia Physical Anesthesia Plan  ASA: III  Anesthesia Plan: General ETT   Post-op Pain Management:  Induction:   PONV Risk Score and Plan:   Airway Management Planned:   Additional Equipment:   Intra-op Plan:   Post-operative Plan:   Informed Consent: I have reviewed the patients History and Physical, chart, labs and discussed the procedure including the risks, benefits and alternatives for the proposed anesthesia with the patient or authorized representative who has indicated his/her understanding and acceptance.     Dental Advisory Given  Plan Discussed with: CRNA  Anesthesia Plan Comments:         Anesthesia Quick Evaluation

## 2019-06-07 NOTE — Interval H&P Note (Signed)
History and Physical Interval Note:  06/07/2019 12:07 PM  Joan [name]  has presented today for surgery, with the diagnosis of acute pelvic pain.  The various methods of treatment have been discussed with the patient and family. After consideration of risks, benefits and other options for treatment, the patient has consented to  Procedure(s): LAPAROSCOPY DIAGNOSTIC (N/A) LAPAROSCOPIC LYSIS OF ADHESIONS (N/A) as a surgical intervention.  The patient's history has been reviewed, patient examined, no change in status, stable for surgery.  I have reviewed the patient's chart and labs.  Questions were answered to the patient's satisfaction.     Benjaman Kindler

## 2019-06-07 NOTE — Anesthesia Post-op Follow-up Note (Signed)
Anesthesia QCDR form completed.        

## 2019-06-07 NOTE — Anesthesia Procedure Notes (Signed)
Procedure Name: Intubation Date/Time: 06/07/2019 12:59 PM Performed by: Debe Coder, CRNA Pre-anesthesia Checklist: Patient identified, Emergency Drugs available, Suction available and Patient being monitored Patient Re-evaluated:Patient Re-evaluated prior to induction Oxygen Delivery Method: Circle system utilized Preoxygenation: Pre-oxygenation with 100% oxygen Induction Type: IV induction Ventilation: Mask ventilation without difficulty Laryngoscope Size: Mac and 3 Grade View: Grade I Tube type: Oral Tube size: 7.5 mm Number of attempts: 1 Airway Equipment and Method: Stylet and Oral airway Placement Confirmation: ETT inserted through vocal cords under direct vision,  positive ETCO2 and breath sounds checked- equal and bilateral Secured at: 21 cm Tube secured with: Tape Dental Injury: Teeth and Oropharynx as per pre-operative assessment

## 2019-06-08 ENCOUNTER — Encounter: Payer: Self-pay | Admitting: Obstetrics and Gynecology

## 2019-06-08 NOTE — Anesthesia Postprocedure Evaluation (Signed)
Anesthesia Post Note  Patient: Joan [name]  Procedure(s) Performed: LAPAROSCOPY OPERATIVE, WITH PERITONEAL BIOPSIES (N/A )  Patient location during evaluation: PACU Anesthesia Type: General Level of consciousness: awake and alert Pain management: pain level controlled Vital Signs Assessment: post-procedure vital signs reviewed and stable Respiratory status: spontaneous breathing, nonlabored ventilation and respiratory function stable Cardiovascular status: blood pressure returned to baseline and stable Postop Assessment: no apparent nausea or vomiting Anesthetic complications: no     Last Vitals:  Vitals:   06/07/19 1610 06/07/19 1630  BP: 118/74 125/87  Pulse: 92 92  Resp: 16 16  Temp: 36.7 C 36.8 C  SpO2: 97% 98%    Last Pain:  Vitals:   06/07/19 1630  TempSrc: Temporal  PainSc: [place]

## 2019-06-09 LAB — SURGICAL PATHOLOGY

## 2019-06-17 ENCOUNTER — Other Ambulatory Visit: Payer: Self-pay | Admitting: Primary Care

## 2019-06-17 DIAGNOSIS — R1031 Right lower quadrant pain: Secondary | ICD-10-CM

## 2019-06-28 DIAGNOSIS — E119 Type 2 diabetes mellitus without complications: Secondary | ICD-10-CM

## 2019-06-28 DIAGNOSIS — Z794 Long term (current) use of insulin: Secondary | ICD-10-CM

## 2019-06-28 MED ORDER — TRULICITY 0.75 MG/0.5ML ~~LOC~~ SOAJ: SUBCUTANEOUS | 3 refills | Status: DC

## 2019-06-28 NOTE — Telephone Encounter (Signed)
Last prescribed on 02/15/2019 2 ml with 2 refills . Last appointment on 05/21/2019 . No future appointment

## 2019-06-29 ENCOUNTER — Other Ambulatory Visit: Payer: Self-pay

## 2019-06-29 ENCOUNTER — Encounter: Payer: Self-pay | Admitting: Medical Oncology

## 2019-06-29 ENCOUNTER — Emergency Department: Payer: 59

## 2019-06-29 ENCOUNTER — Emergency Department
Admission: EM | Admit: 2019-06-29 | Discharge: 2019-06-29 | Disposition: A | Discharge status: Home / Self Care | Payer: 59 | Attending: Emergency Medicine | Admitting: Emergency Medicine

## 2019-06-29 DIAGNOSIS — E119 Type 2 diabetes mellitus without complications: Secondary | ICD-10-CM | POA: Insufficient documentation

## 2019-06-29 DIAGNOSIS — Z20828 Contact with and (suspected) exposure to other viral communicable diseases: Secondary | ICD-10-CM | POA: Insufficient documentation

## 2019-06-29 DIAGNOSIS — J45909 Unspecified asthma, uncomplicated: Secondary | ICD-10-CM | POA: Insufficient documentation

## 2019-06-29 DIAGNOSIS — I10 Essential (primary) hypertension: Secondary | ICD-10-CM | POA: Diagnosis not present

## 2019-06-29 DIAGNOSIS — Z794 Long term (current) use of insulin: Secondary | ICD-10-CM | POA: Insufficient documentation

## 2019-06-29 DIAGNOSIS — Z79899 Other long term (current) drug therapy: Secondary | ICD-10-CM | POA: Insufficient documentation

## 2019-06-29 DIAGNOSIS — J069 Acute upper respiratory infection, unspecified: Secondary | ICD-10-CM | POA: Diagnosis not present

## 2019-06-29 DIAGNOSIS — R05 Cough: Secondary | ICD-10-CM | POA: Diagnosis present

## 2019-06-29 DIAGNOSIS — H6502 Acute serous otitis media, left ear: Secondary | ICD-10-CM | POA: Diagnosis not present

## 2019-06-29 LAB — INFLUENZA PANEL BY PCR (TYPE A & B)
Influenza A By PCR: NEGATIVE
Influenza B By PCR: NEGATIVE

## 2019-06-29 LAB — SARS CORONAVIRUS 2 (TAT 6-24 HRS): SARS Coronavirus 2: NEGATIVE

## 2019-06-29 MED ORDER — IPRATROPIUM-ALBUTEROL 0.5-2.5 (3) MG/3ML IN SOLN
3.0000 mL | Freq: Once | RESPIRATORY_TRACT | Status: AC
  Administered 2019-06-29: 14:00:00 3 mL via RESPIRATORY_TRACT
  Filled 2019-06-29: qty 3

## 2019-06-29 MED ORDER — PSEUDOEPH-BROMPHEN-DM 30-2-10 MG/5ML PO SYRP: 5.0000 mL | ORAL_SOLUTION | Freq: Four times a day (QID) | ORAL | 0 refills | Status: DC | PRN

## 2019-06-29 MED ORDER — CLARITHROMYCIN 250 MG PO TABS: 250.0000 mg | ORAL_TABLET | Freq: Two times a day (BID) | ORAL | 0 refills | Status: AC

## 2019-06-29 NOTE — ED Provider Notes (Signed)
[facility] Emergency Department Provider Note   ____________________________________________   First MD Initiated Contact with Patient 06/29/19 1253     (approximate)  I have reviewed the triage vital signs and the nursing notes.   HISTORY  Chief Complaint Cough and Wheezing    HPI Joan [name] is a 38 y.o. female patient sent from urgent care secondary to having a couple days of fever, chills, cough, congestion, and wheezing.  Patient also complaining of frontal headache.  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  Patient denies recent travel.  Patient denies chest abdominal pain.       Past Medical History:  Diagnosis Date  . Asthma   . Auditory hallucinations    only after anesthesia  . Diabetes mellitus without complication (HCC)    diet controlled  . Diabetes mellitus, type II (HCC)    insulin, jardiance  . Dyspnea    with exertion  . Essential hypertension   . Essential hypertension 11/24/2017  . GERD (gastroesophageal reflux disease)    occasionally-NO MEDS  . MDD (major depressive disorder)   . Miscarriage   . PTSD (post-traumatic stress disorder)   . Tachycardia     Patient Active Problem List   Diagnosis Date Noted  . Kidney stone 02/11/2019  . Asthma 11/03/2018  . Acute non-recurrent maxillary sinusitis 09/25/2018  . Recurrent sinusitis 05/20/2018  . History of placement of ear tubes 05/20/2018  . Fever and chills 05/08/2018  . Tachycardia 05/04/2018  . Missed period 04/29/2018  . Acute asthma exacerbation 04/10/2018  . Right lower quadrant abdominal pain 02/13/2018  . BV (bacterial vaginosis) 02/13/2018  . Diabetes mellitus type 2, uncontrolled ([place]) 11/24/2017  . Essential hypertension 11/24/2017  . Hyperlipidemia 11/24/2017  . Major depressive disorder, recurrent, severe without psychotic features ([place])   . PTSD (post-traumatic stress disorder) 04/11/2015    Past Surgical  History:  Procedure Laterality Date  . ABDOMINAL HYSTERECTOMY    . ADENOIDECTOMY    . CHOLECYSTECTOMY  2009  . CYSTOSCOPY N/A 07/31/2018   Procedure: CYSTOSCOPY;  Surgeon: Benjaman Kindler, MD;  Location: [facility];  Service: Gynecology;  Laterality: N/A;  . LAPAROSCOPIC BILATERAL SALPINGECTOMY Bilateral 07/31/2018   Procedure: LAPAROSCOPIC BILATERAL SALPINGECTOMY;  Surgeon: Benjaman Kindler, MD;  Location: [facility];  Service: Gynecology;  Laterality: Bilateral;  . LAPAROSCOPIC HYSTERECTOMY N/A 07/31/2018   Procedure: HYSTERECTOMY TOTAL LAPAROSCOPIC;  Surgeon: Benjaman Kindler, MD;  Location: [facility];  Service: Gynecology;  Laterality: N/A;  . LAPAROSCOPY N/A 06/07/2019   Procedure: LAPAROSCOPY OPERATIVE, WITH PERITONEAL BIOPSIES;  Surgeon: Benjaman Kindler, MD;  Location: [facility];  Service: Gynecology;  Laterality: N/A;  . LYSIS OF ADHESION N/A 07/31/2018   Procedure: LYSIS OF ADHESION;  Surgeon: Benjaman Kindler, MD;  Location: [facility];  Service: Gynecology;  Laterality: N/A;  . OVARY SURGERY Right    cyst removed a while ago  . TONSILLECTOMY    . tubes in ear      Prior to Admission medications   Medication Sig Start Date End Date Taking? Authorizing Provider  albuterol (VENTOLIN HFA) 108 (90 Base) MCG/ACT inhaler INHALE 2 PUFFS BY MOUTH EVERY 6 HOURS AS NEEDED FOR SHORTNESS OF BREATH AND WHEEZING Patient taking differently: Inhale 2 puffs into the lungs every 6 (six) hours as needed for wheezing or shortness of breath. INHALE 2 PUFFS BY MOUTH EVERY 6 HOURS AS NEEDED FOR SHORTNESS OF BREATH AND WHEEZING 10/19/18   Pleas Koch, NP  blood glucose meter kit and supplies KIT Dispense based on patient and insurance preference. Use up three times daily as directed. (FOR ICD-9 250.00, 250.01). 11/24/17   Pleas Koch, NP  brompheniramine-pseudoephedrine-DM 30-2-10 MG/5ML syrup Take 5 mLs by mouth 4 (four) times daily as needed. 06/29/19   Sable Feil, PA-C  clarithromycin  (BIAXIN) 250 MG tablet Take 1 tablet (250 mg total) by mouth 2 (two) times daily for 14 days. 06/29/19 07/13/19  Sable Feil, PA-C  Dulaglutide (TRULICITY) 1.42 LT/5.3UY SOPN Inject once weekly for diabetes. 06/28/19   Pleas Koch, NP  insulin glargine (LANTUS) 100 UNIT/ML injection Inject 50 Units into the skin 2 (two) times daily.     [provider]  Insulin Pen Needle (PEN NEEDLES) 31G X 6 MM MISC Use with insulin as directed. 11/04/18   Pleas Koch, NP  losartan (COZAAR) 100 MG tablet Take 100 mg by mouth daily.    [provider]  Spacer/Aero-Holding Chambers (AEROCHAMBER PLUS) inhaler Use as instructed. Generic spacer if possible 03/30/18   Copland, Frederico Hamman, MD  montelukast (SINGULAIR) 10 MG tablet TAKE 1 TABLET(10 MG) BY MOUTH AT BEDTIME 08/14/18 02/28/19  Laverle Hobby, MD    Allergies Ibuprofen, Ciprofloxacin, Metformin and related, Penicillins, Sulfa antibiotics, Other, and Shellfish allergy  Family History  Problem Relation Age of Onset  . Asthma Mother   . Diabetes Mother   . Hyperlipidemia Mother   . Hypertension Mother   . Diabetes Father   . Hyperlipidemia Father   . Hypertension Father   . Diabetes Brother   . Depression Brother   . Alcohol abuse Brother   . Depression Maternal Grandmother   . Stroke Paternal Grandmother   . Breast cancer Neg Hx     Social History Social History   Tobacco Use  . Smoking status: Never Smoker  . Smokeless tobacco: Never Used  Substance Use Topics  . Alcohol use: No  . Drug use: No    Review of Systems Constitutional: Fever/chills Eyes: No visual changes. ENT: No sore throat.  Left ear pain. Cardiovascular: Denies chest pain. Respiratory: Denies shortness of breath.  Nonproductive cough with wheezing. Gastrointestinal: No abdominal pain.  No nausea, no vomiting.  No diarrhea.  No constipation. Genitourinary: Negative for dysuria. Musculoskeletal: Negative for back pain. Skin:  Negative for rash. Neurological: Negative for headaches, focal weakness or numbness. Psychiatric:  Depression and PTSD. Endocrine:  Diabetes, hyperlipidemia, hypertension. Allergic/Immunilogical: Ibuprofen, Cipro, Metformin, penicillin, sulfa, and shellfish. ____________________________________________   PHYSICAL EXAM:  VITAL SIGNS: ED Triage Vitals  Enc Vitals Group     BP 06/29/19 1209 (!) 171/101     Pulse Rate 06/29/19 1209 93     Resp 06/29/19 1209 18     Temp 06/29/19 1209 98.6 F (37 C)     Temp Source 06/29/19 1209 Oral     SpO2 06/29/19 1209 97 %     Weight 06/29/19 1210 290 lb (131.5 kg)     Height 06/29/19 1210 5' 6" (1.676 m)     Head Circumference --      Peak Flow --      Pain Score 06/29/19 1210 0     Pain Loc --      Pain Edu? --      Excl. in [place]? --     Constitutional: Alert and oriented. Well appearing and in no acute distress. Eyes: Conjunctivae are normal. PERRL. EOMI. Head: Atraumatic. Nose: No congestion/rhinnorhea. EAR: Edematous and erythematous left TM.  Mouth/Throat: Mucous membranes are moist.  Oropharynx non-erythematous. Neck: No stridor.   Hematological/Lymphatic/Immunilogical: No cervical lymphadenopathy. Cardiovascular: Normal rate, regular rhythm. Grossly normal heart sounds.  Good peripheral circulation.  Abated blood pressure. Respiratory: Normal respiratory effort.  No retractions. Lungs CTAB. Neurologic:  Normal speech and language. No gross focal neurologic deficits are appreciated. No gait instability. Skin:  Skin is warm, dry and intact. No rash noted. Psychiatric: Mood and affect are normal. Speech and behavior are normal.  ____________________________________________   LABS (all labs ordered are listed, but only abnormal results are displayed)  Labs Reviewed  SARS CORONAVIRUS 2 (TAT 6-24 HRS)  INFLUENZA PANEL BY PCR (TYPE A & B)   ____________________________________________  EKG    ____________________________________________  RADIOLOGY  ED MD interpretation:    Official radiology report(s): Dg Chest Portable 1 View  Result Date: 06/29/2019 CLINICAL DATA:  Cough and wheeze EXAM: PORTABLE CHEST 1 VIEW COMPARISON:  None. FINDINGS: The heart size and mediastinal contours are within normal limits. Both lungs are clear. No pleural effusion. The visualized skeletal structures are unremarkable. IMPRESSION: No acute process in the chest. Electronically Signed   By: Macy Mis M.D.   On: 06/29/2019 13:33    ____________________________________________   PROCEDURES  Procedure(s) performed (including Critical Care):  Procedures   ____________________________________________   INITIAL IMPRESSION / ASSESSMENT AND PLAN / ED COURSE  As part of my medical decision making, I reviewed the following data within the [place]     Patient presents with headache, fever, chills, cough, and chest congestion.  Patient also stated left ear pain.  Physical exam consistent with viral respiratory infection with cough.  Patient also has left otitis media.  Patient given discharge care instruction advised self quarantine pending results of COVID-19 test.  Take medication as directed.    Joan [name] was evaluated in Emergency Department on 06/29/2019 for the symptoms described in the history of present illness. She was evaluated in the context of the global COVID-19 pandemic, which necessitated consideration that the patient might be at risk for infection with the SARS-CoV-2 virus that causes COVID-19. Institutional protocols and algorithms that pertain to the evaluation of patients at risk for COVID-19 are in a state of rapid change based on information released by regulatory bodies including the CDC and federal and state organizations. These policies and algorithms were followed during the patient's care in the ED.        ____________________________________________   FINAL CLINICAL IMPRESSION(S) / ED DIAGNOSES  Final diagnoses:  Viral URI with cough  Non-recurrent acute serous otitis media of left ear     ED Discharge Orders         Ordered    clarithromycin (BIAXIN) 250 MG tablet  2 times daily     06/29/19 1515    brompheniramine-pseudoephedrine-DM 30-2-10 MG/5ML syrup  4 times daily PRN     06/29/19 1515           Note:  This document was prepared using Dragon voice recognition software and may include unintentional dictation errors.    Sable Feil, PA-C 06/29/19 1522    Nena Polio, MD 06/29/19 [phone]

## 2019-06-29 NOTE — ED Triage Notes (Signed)
Pt reports that she began a couple days having headache, fever, chills, cough and congestion. Pt in NAD at this time. 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

## 2019-06-29 NOTE — Discharge Instructions (Signed)
Follow discharge care instructions take medication as directed.  Advised self quarantine pending results of COVID-19 test. 

## 2019-06-29 NOTE — ED Notes (Signed)
See triage note   States she woke up this am with some wheezing and cough this am  States she is also hoarse  Went to [facility] and was sent here for wheezing   Afebrile on arrival   States she did have some fever at [facility]

## 2019-07-19 ENCOUNTER — Ambulatory Visit
Admission: RE | Admit: 2019-07-19 | Discharge: 2019-07-19 | Disposition: A | Discharge status: Home / Self Care | Payer: Managed Care, Other (non HMO) | Source: Ambulatory Visit | Attending: Obstetrics and Gynecology | Admitting: Obstetrics and Gynecology

## 2019-07-19 DIAGNOSIS — N6489 Other specified disorders of breast: Secondary | ICD-10-CM | POA: Insufficient documentation

## 2019-07-20 DIAGNOSIS — J309 Allergic rhinitis, unspecified: Secondary | ICD-10-CM

## 2019-07-20 DIAGNOSIS — J329 Chronic sinusitis, unspecified: Secondary | ICD-10-CM

## 2019-07-20 DIAGNOSIS — J4540 Moderate persistent asthma, uncomplicated: Secondary | ICD-10-CM

## 2019-07-21 NOTE — Telephone Encounter (Signed)
Joan [name], I decided to send her to the asthma and allergy clinic rather than just to pulmonology. Can you help change? I put in a new referral.  Thanks!

## 2019-07-22 ENCOUNTER — Ambulatory Visit (INDEPENDENT_AMBULATORY_CARE_PROVIDER_SITE_OTHER): Payer: Managed Care, Other (non HMO) | Admitting: Family Medicine

## 2019-07-22 ENCOUNTER — Encounter: Payer: Self-pay | Admitting: Family Medicine

## 2019-07-22 ENCOUNTER — Other Ambulatory Visit: Payer: Self-pay

## 2019-07-22 VITALS — Temp 100.0°F

## 2019-07-22 DIAGNOSIS — J069 Acute upper respiratory infection, unspecified: Secondary | ICD-10-CM | POA: Insufficient documentation

## 2019-07-22 DIAGNOSIS — J45901 Unspecified asthma with (acute) exacerbation: Secondary | ICD-10-CM | POA: Diagnosis not present

## 2019-07-22 MED ORDER — PREDNISONE 10 MG PO TABS: ORAL_TABLET | ORAL | 0 refills | Status: DC

## 2019-07-22 NOTE — Telephone Encounter (Signed)
Pulmonology referral closed.  Will get pt scheduled w/[place] allergist.

## 2019-07-22 NOTE — Progress Notes (Signed)
Virtual Visit via Video Note  I connected with Joan [name] on 07/22/19 at 10:15 AM EST by a video enabled telemedicine application and verified that I am speaking with the correct person using two identifiers.  Location: Patient: home Provider: office    I discussed the limitations of evaluation and management by telemedicine and the availability of in person appointments. The patient expressed understanding and agreed to proceed.  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 of NP Clark here with respiratory symptoms   Started 2 d ago  Chills and low grade temp   [facility] Has not been tested for covid   Asthma is flared up - sob and wheezing  Uses albuterol about every 4 hours  Has not had prednisone in a while   A little cough  Dry   GI- a little nauseated/no diarrhea  No loss of taste or smell    Nasal congestion -cannot get anything out   Headache -forehead/ both sides  It is throbbing  Chills temp 100 F   No known exp to covid  Works at a day care   Otc:  Tylenol for fever and chills and aches  No other otc medicines   Prednisone makes her sugar go high - she watches it   Patient Active Problem List   Diagnosis Date Noted  . Viral URI with cough 07/22/2019  . Kidney stone 02/11/2019  . Asthma 11/03/2018  . Acute non-recurrent maxillary sinusitis 09/25/2018  . Recurrent sinusitis 05/20/2018  . History of placement of ear tubes 05/20/2018  . Fever and chills 05/08/2018  . Tachycardia 05/04/2018  . Missed period 04/29/2018  . Acute asthma exacerbation 04/10/2018  . Right lower quadrant abdominal pain 02/13/2018  . BV (bacterial vaginosis) 02/13/2018  . Diabetes mellitus type 2, uncontrolled ([place]) 11/24/2017  . Essential hypertension 11/24/2017  . Hyperlipidemia 11/24/2017  . Major depressive disorder, recurrent, severe without  psychotic features ([place])   . PTSD (post-traumatic stress disorder) 04/11/2015   Past Medical History:  Diagnosis Date  . Asthma   . Auditory hallucinations    only after anesthesia  . Diabetes mellitus without complication (HCC)    diet controlled  . Diabetes mellitus, type II (HCC)    insulin, jardiance  . Dyspnea    with exertion  . Essential hypertension   . Essential hypertension 11/24/2017  . GERD (gastroesophageal reflux disease)    occasionally-NO MEDS  . MDD (major depressive disorder)   . Miscarriage   . PTSD (post-traumatic stress disorder)   . Tachycardia    Past Surgical History:  Procedure Laterality Date  . ABDOMINAL HYSTERECTOMY    . ADENOIDECTOMY    . CHOLECYSTECTOMY  2009  . CYSTOSCOPY N/A 07/31/2018   Procedure: CYSTOSCOPY;  Surgeon: Benjaman Kindler, MD;  Location: [facility];  Service: Gynecology;  Laterality: N/A;  . LAPAROSCOPIC BILATERAL SALPINGECTOMY Bilateral 07/31/2018   Procedure: LAPAROSCOPIC BILATERAL SALPINGECTOMY;  Surgeon: Benjaman Kindler, MD;  Location: [facility];  Service: Gynecology;  Laterality: Bilateral;  . LAPAROSCOPIC HYSTERECTOMY N/A 07/31/2018   Procedure: HYSTERECTOMY TOTAL LAPAROSCOPIC;  Surgeon: Benjaman Kindler, MD;  Location: [facility];  Service: Gynecology;  Laterality: N/A;  . LAPAROSCOPY N/A 06/07/2019   Procedure: LAPAROSCOPY OPERATIVE, WITH PERITONEAL BIOPSIES;  Surgeon: Benjaman Kindler, MD;  Location: [facility];  Service: Gynecology;  Laterality: N/A;  . LYSIS OF ADHESION N/A 07/31/2018  Procedure: LYSIS OF ADHESION;  Surgeon: Benjaman Kindler, MD;  Location: [facility];  Service: Gynecology;  Laterality: N/A;  . OVARY SURGERY Right    cyst removed a while ago  . TONSILLECTOMY    . tubes in ear     Social History   Tobacco Use  . Smoking status: Never Smoker  . Smokeless tobacco: Never Used  Substance Use Topics  . Alcohol use: No  . Drug use: No   Family History  Problem Relation Age of Onset  . Asthma Mother   .  Diabetes Mother   . Hyperlipidemia Mother   . Hypertension Mother   . Diabetes Father   . Hyperlipidemia Father   . Hypertension Father   . Diabetes Brother   . Depression Brother   . Alcohol abuse Brother   . Depression Maternal Grandmother   . Stroke Paternal Grandmother   . Breast cancer Neg Hx    Allergies  Allergen Reactions  . Ibuprofen Swelling    Facial   . Ciprofloxacin Hives and Rash  . Metformin And Related Other (See Comments)    Elevated Lactic Acid  . Penicillins Hives and Rash    Has patient had a PCN reaction causing immediate rash, facial/tongue/throat swelling, SOB or lightheadedness with hypotension: No Has patient had a PCN reaction causing severe rash involving mucus membranes or skin necrosis: No Has patient had a PCN reaction that required hospitalization: No Has patient had a PCN reaction occurring within the last 10 years: No If all of the above answers are "NO", then may proceed with Cephalosporin use. THE PATIENT IS ABLE TO TOLERATE CEPHALOSPORINS WITHOUT DIFFIC  . Sulfa Antibiotics Hives and Rash  . Other     ALL NUTS-SCRATCHY THROAT  . Shellfish Allergy Other (See Comments)    ALL SEAFOOD-SCRATCHY THROAT   Current Outpatient Medications on File Prior to Visit  Medication Sig Dispense Refill  . albuterol (VENTOLIN HFA) 108 (90 Base) MCG/ACT inhaler INHALE 2 PUFFS BY MOUTH EVERY 6 HOURS AS NEEDED FOR SHORTNESS OF BREATH AND WHEEZING (Patient taking differently: Inhale 2 puffs into the lungs every 6 (six) hours as needed for wheezing or shortness of breath. INHALE 2 PUFFS BY MOUTH EVERY 6 HOURS AS NEEDED FOR SHORTNESS OF BREATH AND WHEEZING) 18 g 0  . blood glucose meter kit and supplies KIT Dispense based on patient and insurance preference. Use up three times daily as directed. (FOR ICD-9 250.00, 250.01). 1 each 0  . Dulaglutide (TRULICITY) 6.38 GT/3.6IW SOPN Inject once weekly for diabetes. 2 mL 3  . insulin glargine (LANTUS) 100 UNIT/ML injection  Inject 50 Units into the skin 2 (two) times daily.     . Insulin Pen Needle (PEN NEEDLES) 31G X 6 MM MISC Use with insulin as directed. 100 each 2  . losartan (COZAAR) 100 MG tablet Take 100 mg by mouth daily.    Marland Kitchen Spacer/Aero-Holding Chambers (AEROCHAMBER PLUS) inhaler Use as instructed. Generic spacer if possible 1 each 2  . [DISCONTINUED] montelukast (SINGULAIR) 10 MG tablet TAKE 1 TABLET(10 MG) BY MOUTH AT BEDTIME 30 tablet 3   No current facility-administered medications on file prior to visit.   Review of Systems  Constitutional: Positive for chills, fever and malaise/fatigue. Negative for diaphoresis.  HENT: Positive for congestion. Negative for ear discharge, ear pain, sinus pain and sore throat.   Eyes: Negative for blurred vision, discharge and redness.  Respiratory: Positive for cough and wheezing. Negative for sputum production, shortness of breath and stridor.  Cardiovascular: Negative for chest pain, palpitations and leg swelling.  Gastrointestinal: Positive for nausea. Negative for abdominal pain, diarrhea and vomiting.  Musculoskeletal: Negative for myalgias.  Skin: Negative for rash.  Neurological: Negative for dizziness and headaches.      Observations/Objective: Patient appears well, in no distress Weight is baseline (obese) No facial swelling or asymmetry Normal voice-not hoarse and no slurred speech No obvious tremor or mobility impairment Moving neck and UEs normally Able to hear the call well  Cough sounds dry and tight Mild wheeze heard on forced expiration with slt prolonged exp phase Not sob with speech Talkative and mentally sharp with no cognitive changes No skin changes on face or neck , no rash or pallor Affect is normal    Assessment and Plan: Problem List Items Addressed This Visit      Respiratory   Acute asthma exacerbation    With vial uri (flu test negative)  Planning on covid testing  Prednisone 30 mg taper sent (aware of side eff incl  inc in glucose) Has albuterol  inst to alert [place] if symptoms worsen or go to ED      Relevant Medications   predniSONE (DELTASONE) 10 MG tablet   Viral URI with cough - Primary    With low grade fever/ asthma exacerbation  Prednisone taper px  Tylenol prn  sympt care/fluids/rest/isolation at home Given info to [facility] app[facility] Work note written inst to go to ED if sob or suddenly worse and alert [place]  Disc symptoms to watch for  Pending test result-will monitor          Follow Up Instructions: Drink fluids and rest  Use the numbers I gave you to schedule a covid test and please call us if you have problems getting an appointment  Tylenol for fever/chills/aches  Isolate yourself until test is resulted and symptoms are better Let us know when you get a result  I sent in prednisone for your asthma (you are aware it will increase blood glucose levels)  I will do a work note you can access from mychart   If symptoms suddenly worsen- please alert [place] and go to the ER for urgent evaluation     I discussed the assessment and treatment plan with the patient. The patient was provided an opportunity to ask questions and all were answered. The patient agreed with the plan and demonstrated an understanding of the instructions.   The patient was advised to call back or seek an in-person evaluation if the symptoms worsen or if the condition fails to improve as anticipated.     Loura Pardon, MD

## 2019-07-22 NOTE — Patient Instructions (Addendum)
Drink fluids and rest  Use the numbers I gave you to schedule a covid test and please call us if you have problems getting an appointment  Tylenol for fever/chills/aches  Isolate yourself until test is resulted and symptoms are better Let us know when you get a result  I sent in prednisone for your asthma (you are aware it will increase blood glucose levels)  I will do a work note you can access from mychart   If symptoms suddenly worsen- please alert [place] and go to the ER for urgent evaluation

## 2019-07-22 NOTE — Assessment & Plan Note (Signed)
With vial uri (flu test negative)  Planning on covid testing  Prednisone 30 mg taper sent (aware of side eff incl inc in glucose) Has albuterol  inst to alert [place] if symptoms worsen or go to ED

## 2019-07-22 NOTE — Assessment & Plan Note (Signed)
With low grade fever/ asthma exacerbation  Prednisone taper px  Tylenol prn  sympt care/fluids/rest/isolation at home Given info to make appt for [facility]id test [facility] Work note written inst to go to ED if sob or suddenly worse and alert [place]  Disc symptoms to watch for  Pending test result-will monitor

## 2019-07-23 ENCOUNTER — Other Ambulatory Visit: Payer: Self-pay

## 2019-07-23 ENCOUNTER — Ambulatory Visit: Payer: Managed Care, Other (non HMO) | Attending: Internal Medicine

## 2019-07-23 DIAGNOSIS — Z20822 Contact with and (suspected) exposure to covid-19: Secondary | ICD-10-CM

## 2019-07-24 LAB — NOVEL CORONAVIRUS, NAA: SARS-CoV-2, NAA: NOT DETECTED

## 2019-08-05 DIAGNOSIS — E1165 Type 2 diabetes mellitus with hyperglycemia: Secondary | ICD-10-CM

## 2019-08-05 MED ORDER — INSULIN GLARGINE 100 UNIT/ML ~~LOC~~ SOLN: 30.0000 [IU] | Freq: Every day | SUBCUTANEOUS | 0 refills | Status: DC

## 2019-08-05 NOTE — Telephone Encounter (Signed)
Lantus is listed historical in her med list. Please review.

## 2019-08-16 ENCOUNTER — Encounter: Payer: Self-pay | Admitting: Primary Care

## 2019-08-16 ENCOUNTER — Ambulatory Visit (INDEPENDENT_AMBULATORY_CARE_PROVIDER_SITE_OTHER): Payer: 59 | Admitting: Primary Care

## 2019-08-16 DIAGNOSIS — I10 Essential (primary) hypertension: Secondary | ICD-10-CM | POA: Diagnosis not present

## 2019-08-16 DIAGNOSIS — U071 COVID-19: Secondary | ICD-10-CM | POA: Diagnosis not present

## 2019-08-16 DIAGNOSIS — E1165 Type 2 diabetes mellitus with hyperglycemia: Secondary | ICD-10-CM | POA: Diagnosis not present

## 2019-08-16 DIAGNOSIS — E785 Hyperlipidemia, unspecified: Secondary | ICD-10-CM | POA: Diagnosis not present

## 2019-08-16 HISTORY — DX: COVID-19: U07.1

## 2019-08-16 NOTE — Assessment & Plan Note (Signed)
Diagnosed on 08/09/19, continues with symptoms. Agree that she should not return to work this week given continued symptoms with fevers.  Discussed when she can come out of quarantine. Work note provided for return date of 08/23/19.  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 She appears stable.

## 2019-08-16 NOTE — Patient Instructions (Signed)
Continue to monitor your temperatures and oxygen levels. Use your albuterol inhaler if needed. You can take Tylenol for fevers, body aches, headaches, etc.   We will likely need to start a smaller dose of losartan for kidney protection against diabetes. We can talk about this during your upcoming office visit.   Please try to monitor your blood pressure if possible.  Start checking your blood sugar levels.  Appropriate times to check your blood sugar levels are:  -Before any meal (breakfast, lunch, dinner) -Two hours after any meal (breakfast, lunch, dinner) -Bedtime  Schedule a visit with me for 2 weeks after you've consistently checked your blood sugars.   It was a pleasure to see you today! Mayra Reel, NP-C

## 2019-08-16 NOTE — Assessment & Plan Note (Signed)
Previously managed on atorvastatin, needs repeat lipids. Will need to re-initiate. Will evaluate at next visit.

## 2019-08-16 NOTE — Progress Notes (Signed)
Subjective:    Patient ID: Joan [name], female    DOB: 11-21-1980, 39 y.o.   MRN: [id number]  HPI  Virtual Visit via Video Note  I connected with Joan [name] on 08/16/19 at  3:20 PM EST by a video enabled telemedicine application and verified that I am speaking with the correct person using two identifiers.  Location: Patient: Home Provider: Office   I discussed the limitations of evaluation and management by telemedicine and the availability of in person appointments. The patient expressed understanding and agreed to proceed.  History of Present Illness:  Ms. Vanburen is a 39 year old female with a history of uncontrolled diabetes, medication non compliance, asthma, hypertension, PTSD, hyperlipidemia, Covid-19 infection who presents today with Covid-19 symptoms and follow up of medical conditions.  1) Covid-19: Symptoms began December 31st so she tested on January 2nd, positive results on January 4th. Symptoms include fevers (ranging 100.4-101.3), shortness of breath, cough, diarrhea, decreased appetite, loss of taste/smell. Overall she's feeling about the same, no worse.   She's using her albuterol inhaler with improvement. She's checking her oxygen saturation which is running 97-98% on average. She is supposed to return to work on Thursday 14th but given fevers and continued symptoms she doesn't feel as though she will be ready. She would like a work note for this week.   2) Type 2 Diabetes:  Current medications include: Lantus 25 units nightly, Trulicity 1.5 mg weekly.  She is not checking blood sugars as she ran out of test strips. Her whole family has Covid-19 so she's been unable to get test strips.   Last A1C: 12.4 in October 2020 Last Eye Exam: Overdue, she will schedule Last Foot Exam: Due next visit Pneumonia Vaccination: Completed in 2017 ACE/ARB: Losartan but stopped taking. Statin: None. Lipid panel due next visit.  3) Essential Hypertension: Currently  prescribed losartan 100 mg but has not taken in quite some time as [facility]. She does not check her blood pressure at home.   [facility] 130/75, she was not taking losartan at that time.   Observations/Objective:  Alert and oriented. Appears ill, tired. No distress. Speaking in complete sentences. No cough.  Assessment and Plan:  See problem based charting.  Follow Up Instructions:  Continue to monitor your temperatures and oxygen levels. Use your albuterol inhaler if needed. You can take Tylenol for fevers, body aches, headaches, etc.   We will likely need to start a smaller dose of losartan for kidney protection against diabetes. We can talk about this during your upcoming office visit.   Please try to monitor your blood pressure if possible.  Start checking your blood sugar levels.  Appropriate times to check your blood sugar levels are:  -Before any meal (breakfast, lunch, dinner) -Two hours after any meal (breakfast, lunch, dinner) -Bedtime  Schedule a visit with me for 2 weeks after you've consistently checked your blood sugars.   It was a pleasure to see you today! Allie Bossier, NP-C     I discussed the assessment and treatment plan with the patient. The patient was provided an opportunity to ask questions and all were answered. The patient agreed with the plan and demonstrated an understanding of the instructions.   The patient was advised to call back or seek an in-person evaluation if the symptoms worsen or if the condition fails to improve as anticipated.    Pleas Koch,  NP    Review of Systems  Constitutional: Positive for fatigue and fever.  HENT: Positive for congestion.   Respiratory: Positive for cough and shortness of breath.   Cardiovascular: Negative for chest pain.  Gastrointestinal: Positive for diarrhea.  Neurological: Positive for headaches.       Past Medical  History:  Diagnosis Date  . Asthma   . Auditory hallucinations    only after anesthesia  . Diabetes mellitus without complication (HCC)    diet controlled  . Diabetes mellitus, type II (HCC)    insulin, jardiance  . Dyspnea    with exertion  . Essential hypertension   . Essential hypertension 11/24/2017  . GERD (gastroesophageal reflux disease)    occasionally-NO MEDS  . MDD (major depressive disorder)   . Miscarriage   . PTSD (post-traumatic stress disorder)   . Tachycardia      Social History   Socioeconomic History  . Marital status: Married    Spouse name: chip  . Number of children: 0  . Years of education: Not on file  . Highest education level: High school graduate  Occupational History  . Occupation: day care worker  Tobacco Use  . Smoking status: Never Smoker  . Smokeless tobacco: Never Used  Substance and Sexual Activity  . Alcohol use: No  . Drug use: No  . Sexual activity: Yes  Other Topics Concern  . Not on file  Social History Narrative  . Not on file   Social Determinants of Health   Financial Resource Strain:   . Difficulty of Paying Living Expenses: Not on file  Food Insecurity:   . Worried About Charity fundraiser in the Last Year: Not on file  . Ran Out of Food in the Last Year: Not on file  Transportation Needs:   . Lack of Transportation (Medical): Not on file  . Lack of Transportation (Non-Medical): Not on file  Physical Activity:   . Days of Exercise per Week: Not on file  . Minutes of Exercise per Session: Not on file  Stress:   . Feeling of Stress : Not on file  Social Connections:   . Frequency of Communication with Friends and Family: Not on file  . Frequency of Social Gatherings with Friends and Family: Not on file  . Attends Religious Services: Not on file  . Active Member of Clubs or Organizations: Not on file  . Attends Archivist Meetings: Not on file  . Marital Status: Not on file  Intimate Partner Violence:     . Fear of Current or Ex-Partner: Not on file  . Emotionally Abused: Not on file  . Physically Abused: Not on file  . Sexually Abused: Not on file    Past Surgical History:  Procedure Laterality Date  . ABDOMINAL HYSTERECTOMY    . ADENOIDECTOMY    . CHOLECYSTECTOMY  2009  . CYSTOSCOPY N/A 07/31/2018   Procedure: CYSTOSCOPY;  Surgeon: Benjaman Kindler, MD;  Location: [facility];  Service: Gynecology;  Laterality: N/A;  . LAPAROSCOPIC BILATERAL SALPINGECTOMY Bilateral 07/31/2018   Procedure: LAPAROSCOPIC BILATERAL SALPINGECTOMY;  Surgeon: Benjaman Kindler, MD;  Location: [facility];  Service: Gynecology;  Laterality: Bilateral;  . LAPAROSCOPIC HYSTERECTOMY N/A 07/31/2018   Procedure: HYSTERECTOMY TOTAL LAPAROSCOPIC;  Surgeon: Benjaman Kindler, MD;  Location: [facility];  Service: Gynecology;  Laterality: N/A;  . LAPAROSCOPY N/A 06/07/2019   Procedure: LAPAROSCOPY OPERATIVE, WITH PERITONEAL BIOPSIES;  Surgeon: Benjaman Kindler, MD;  Location: [facility];  Service: Gynecology;  Laterality:  N/A;  . LYSIS OF ADHESION N/A 07/31/2018   Procedure: LYSIS OF ADHESION;  Surgeon: Benjaman Kindler, MD;  Location: [facility];  Service: Gynecology;  Laterality: N/A;  . OVARY SURGERY Right    cyst removed a while ago  . TONSILLECTOMY    . tubes in ear      Family History  Problem Relation Age of Onset  . Asthma Mother   . Diabetes Mother   . Hyperlipidemia Mother   . Hypertension Mother   . Diabetes Father   . Hyperlipidemia Father   . Hypertension Father   . Diabetes Brother   . Depression Brother   . Alcohol abuse Brother   . Depression Maternal Grandmother   . Stroke Paternal Grandmother   . Breast cancer Neg Hx     Allergies  Allergen Reactions  . Ibuprofen Swelling    Facial   . Ciprofloxacin Hives and Rash  . Metformin And Related Other (See Comments)    Elevated Lactic Acid  . Penicillins Hives and Rash    Has patient had a PCN reaction causing immediate rash, facial/tongue/throat  swelling, SOB or lightheadedness with hypotension: No Has patient had a PCN reaction causing severe rash involving mucus membranes or skin necrosis: No Has patient had a PCN reaction that required hospitalization: No Has patient had a PCN reaction occurring within the last 10 years: No If all of the above answers are "NO", then may proceed with Cephalosporin use. THE PATIENT IS ABLE TO TOLERATE CEPHALOSPORINS WITHOUT DIFFIC  . Sulfa Antibiotics Hives and Rash  . Other     ALL NUTS-SCRATCHY THROAT  . Shellfish Allergy Other (See Comments)    ALL SEAFOOD-SCRATCHY THROAT    Current Outpatient Medications on File Prior to Visit  Medication Sig Dispense Refill  . albuterol (VENTOLIN HFA) 108 (90 Base) MCG/ACT inhaler INHALE 2 PUFFS BY MOUTH EVERY 6 HOURS AS NEEDED FOR SHORTNESS OF BREATH AND WHEEZING (Patient taking differently: Inhale 2 puffs into the lungs every 6 (six) hours as needed for wheezing or shortness of breath. INHALE 2 PUFFS BY MOUTH EVERY 6 HOURS AS NEEDED FOR SHORTNESS OF BREATH AND WHEEZING) 18 g 0  . blood glucose meter kit and supplies KIT Dispense based on patient and insurance preference. Use up three times daily as directed. (FOR ICD-9 250.00, 250.01). 1 each 0  . Dulaglutide (TRULICITY) 9.32 TF/5.7DU SOPN Inject once weekly for diabetes. 2 mL 3  . insulin glargine (LANTUS) 100 UNIT/ML injection Inject 0.3 mLs (30 Units total) into the skin at bedtime. 10 mL 0  . Insulin Pen Needle (PEN NEEDLES) 31G X 6 MM MISC Use with insulin as directed. 100 each 2  . losartan (COZAAR) 100 MG tablet Take 100 mg by mouth daily.    . [DISCONTINUED] montelukast (SINGULAIR) 10 MG tablet TAKE 1 TABLET(10 MG) BY MOUTH AT BEDTIME 30 tablet 3   No current facility-administered medications on file prior to visit.    LMP 07/24/2018 (Exact Date) Comment: surgery 07/31/2018   Objective:   Physical Exam  Constitutional: She is oriented to person, place, and time. She appears well-nourished.    Respiratory: Effort normal.  No cough  Neurological: She is alert and oriented to person, place, and time.  Psychiatric: She has a normal mood and affect.           Assessment & Plan:

## 2019-08-16 NOTE — Assessment & Plan Note (Signed)
Continues to not check glucose levels, endorses that she's out of test strips. She does continue to inject Lantus.  Again discussed the dangers of administering Lantus without checking glucose levels, she verbalized understanding.  Unfortunately she cannot get out of her home due to Covid-19 infection to retrieve strips, same goes for most of her family.   Discussed to schedule a visit with me for 2 weeks after she obtains strips and starts checking.  She is compliant to Lantus and Trulicity.

## 2019-08-16 NOTE — Assessment & Plan Note (Signed)
No use of losartan in months, BP seems stable. Will likely initiate low dose losartan for renal protection, will discuss at upcoming visit.  Recommended Joan [name] start monitoring BP at home.

## 2019-08-19 DIAGNOSIS — F4310 Post-traumatic stress disorder, unspecified: Secondary | ICD-10-CM

## 2019-08-19 DIAGNOSIS — F332 Major depressive disorder, recurrent severe without psychotic features: Secondary | ICD-10-CM

## 2019-08-19 DIAGNOSIS — F411 Generalized anxiety disorder: Secondary | ICD-10-CM

## 2019-08-19 NOTE — Telephone Encounter (Signed)
Irving Burton, I recommend we send her to the respiratory clinic, will you schedule? Thanks!

## 2019-08-20 ENCOUNTER — Telehealth: Payer: Self-pay | Admitting: Medical

## 2019-08-20 ENCOUNTER — Other Ambulatory Visit: Payer: Self-pay

## 2019-08-20 ENCOUNTER — Ambulatory Visit (INDEPENDENT_AMBULATORY_CARE_PROVIDER_SITE_OTHER): Payer: 59 | Admitting: Medical

## 2019-08-20 VITALS — BP 140/82 | HR 95 | Temp 98.3°F | Ht 66.0 in | Wt 292.4 lb

## 2019-08-20 DIAGNOSIS — R0602 Shortness of breath: Secondary | ICD-10-CM

## 2019-08-20 DIAGNOSIS — Z5321 Procedure and treatment not carried out due to patient leaving prior to being seen by health care provider: Secondary | ICD-10-CM | POA: Insufficient documentation

## 2019-08-20 DIAGNOSIS — R059 Cough, unspecified: Secondary | ICD-10-CM

## 2019-08-20 DIAGNOSIS — R05 Cough: Secondary | ICD-10-CM

## 2019-08-20 DIAGNOSIS — E1165 Type 2 diabetes mellitus with hyperglycemia: Secondary | ICD-10-CM

## 2019-08-20 DIAGNOSIS — U071 COVID-19: Secondary | ICD-10-CM

## 2019-08-20 DIAGNOSIS — R110 Nausea: Secondary | ICD-10-CM

## 2019-08-20 DIAGNOSIS — R5383 Other fatigue: Secondary | ICD-10-CM

## 2019-08-20 DIAGNOSIS — J45901 Unspecified asthma with (acute) exacerbation: Secondary | ICD-10-CM

## 2019-08-20 LAB — POCT URINALYSIS DIPSTICK
Bilirubin, UA: NEGATIVE
Blood, UA: NEGATIVE
Glucose, UA: POSITIVE — AB
Ketones, UA: 5
Leukocytes, UA: NEGATIVE
Nitrite, UA: NEGATIVE
Protein, UA: POSITIVE — AB
Spec Grav, UA: 1.025 (ref 1.010–1.025)
Urobilinogen, UA: 0.2 E.U./dL
pH, UA: 6 (ref 5.0–8.0)

## 2019-08-20 LAB — CBC WITH DIFFERENTIAL/PLATELET
Abs Immature Granulocytes: 0.01 10*3/uL (ref 0.00–0.07)
Basophils Absolute: 0 10*3/uL (ref 0.0–0.1)
Basophils Relative: 1 %
Eosinophils Absolute: 0.1 10*3/uL (ref 0.0–0.5)
Eosinophils Relative: 2 %
HCT: 42.8 % (ref 36.0–46.0)
Hemoglobin: 14.4 g/dL (ref 12.0–15.0)
Immature Granulocytes: 0 %
Lymphocytes Relative: 44 %
Lymphs Abs: 2.1 10*3/uL (ref 0.7–4.0)
MCH: 27.6 pg (ref 26.0–34.0)
MCHC: 33.6 g/dL (ref 30.0–36.0)
MCV: 82.1 fL (ref 80.0–100.0)
Monocytes Absolute: 0.3 10*3/uL (ref 0.1–1.0)
Monocytes Relative: 5 %
Neutro Abs: 2.2 10*3/uL (ref 1.7–7.7)
Neutrophils Relative %: 48 %
Platelets: 213 10*3/uL (ref 150–400)
RBC: 5.21 MIL/uL — ABNORMAL HIGH (ref 3.87–5.11)
RDW: 12.7 % (ref 11.5–15.5)
WBC: 4.7 10*3/uL (ref 4.0–10.5)
nRBC: 0 % (ref 0.0–0.2)

## 2019-08-20 LAB — URINALYSIS, ROUTINE W REFLEX MICROSCOPIC
Bilirubin Urine: NEGATIVE
Glucose, UA: >=500 mg/dL — AB
Hgb urine dipstick: NEGATIVE
Ketones, ur: 5 mg/dL — AB
Leukocytes,Ua: NEGATIVE
Nitrite: NEGATIVE
Protein, ur: NEGATIVE mg/dL
Specific Gravity, Urine: 1.03 (ref 1.005–1.030)
pH: 5 (ref 5.0–8.0)

## 2019-08-20 LAB — BASIC METABOLIC PANEL
Anion gap: 10 (ref 5–15)
BUN: 15 mg/dL (ref 6–20)
CO2: 23 mmol/L (ref 22–32)
Calcium: 9.1 mg/dL (ref 8.9–10.3)
Chloride: 99 mmol/L (ref 98–111)
Creatinine, Ser: 0.56 mg/dL (ref 0.44–1.00)
GFR calc Af Amer: >60 mL/min (ref >60)
GFR calc non Af Amer: >60 mL/min (ref >60)
Glucose, Bld: 313 mg/dL — ABNORMAL HIGH (ref 70–99)
Potassium: 4.2 mmol/L (ref 3.5–5.1)
Sodium: 132 mmol/L — ABNORMAL LOW (ref 135–145)

## 2019-08-20 NOTE — Progress Notes (Signed)
Subjective:  Joan [name] is a 39 y.o. female who presents for respiratory illness at the [facility] health respiratory clinic this evening.  PCP is Pleas Koch, NP   She has a history of uncontrolled diabetes, asthma, hypertension, hyperlipidemia, obesity.  She notes having a positive Covid test on August 07, 2019.  She started feeling sick around the same time at the first of the year.  Her symptoms have continued, really unchanged or worse.  She certainly has not had any improvement.  Her symptoms have included fever, cough, sore throat, body aches, shortness of breath, nausea, tightness in the chest particularly at night, no energy, weak.  Even this week she has had fevers up to 101.  She has coughed up a lot of phlegm.  She is taking Tylenol for her symptoms.  She has been using some Mucinex at night.  She has been using her albuterol at least once daily since the early part of January. Uses albuterol sometimes more than once daily.  She had not been on that until recently.  She feels like she is drinking a good amount of fluids.  She does not feel that she is getting over this.  She did a virtual consult 5 days ago earlier this week, was put on the Symbicort and continued on albuterol.  She was advised to follow-up at this clinic.  Her blood sugars have been ranging anywhere from 250 - over 400 even this week.  She takes Lantus 30 minutes per night, Trulicity weekly.  She lives at home with her husband.  He brought her here tonight.  No one else in the household has been sick.    No other aggravating or relieving factors.  No other c/o.  Past Medical History:  Diagnosis Date  . Asthma   . Auditory hallucinations    only after anesthesia  . Diabetes mellitus without complication (HCC)    diet controlled  . Diabetes mellitus, type II (HCC)    insulin, jardiance  . Dyspnea    with exertion  . Essential hypertension   . Essential hypertension 11/24/2017  . GERD (gastroesophageal  reflux disease)    occasionally-NO MEDS  . History of placement of ear tubes 05/20/2018  . MDD (major depressive disorder)   . Miscarriage   . PTSD (post-traumatic stress disorder)   . Tachycardia    Current Outpatient Medications on File Prior to Visit  Medication Sig Dispense Refill  . albuterol (VENTOLIN HFA) 108 (90 Base) MCG/ACT inhaler INHALE 2 PUFFS BY MOUTH EVERY 6 HOURS AS NEEDED FOR SHORTNESS OF BREATH AND WHEEZING (Patient taking differently: Inhale 2 puffs into the lungs every 6 (six) hours as needed for wheezing or shortness of breath. INHALE 2 PUFFS BY MOUTH EVERY 6 HOURS AS NEEDED FOR SHORTNESS OF BREATH AND WHEEZING) 18 g 0  . blood glucose meter kit and supplies KIT Dispense based on patient and insurance preference. Use up three times daily as directed. (FOR ICD-9 250.00, 250.01). 1 each 0  . Dulaglutide (TRULICITY) 2.22 LN/9.8XQ SOPN Inject once weekly for diabetes. 2 mL 3  . insulin glargine (LANTUS) 100 UNIT/ML injection Inject 0.3 mLs (30 Units total) into the skin at bedtime. 10 mL 0  . Insulin Pen Needle (PEN NEEDLES) 31G X 6 MM MISC Use with insulin as directed. 100 each 2  . losartan (COZAAR) 100 MG tablet Take 100 mg by mouth daily.    . montelukast (SINGULAIR) 10 MG tablet Take 10 mg by mouth at  bedtime.     No current facility-administered medications on file prior to visit.     ROS as in subjective   Objective: BP 140/82   Pulse 95   Temp 98.3 F (36.8 C)   Ht '5\' 6"'  (1.676 m)   Wt 292 lb 6.4 oz (132.6 kg)   LMP 07/24/2018 (Exact Date) Comment: surgery 07/31/2018  SpO2 99%   BMI 47.19 kg/m   Wt Readings from Last 3 Encounters:  08/20/19 292 lb 6.4 oz (132.6 kg)  06/29/19 290 lb (131.5 kg)  06/07/19 284 lb 6.3 oz (129 kg)   BP Readings from Last 3 Encounters:  08/20/19 140/82  06/29/19 (!) 171/101  06/07/19 125/87    General appearance: Alert, WD/WN, no distress, somewhat ill appearing, fatigued appearing, white female                              Skin: warm, no rash                           Head: +mild sinus tenderness                            Eyes: conjunctiva normal, corneas clear, PERRLA                            Ears: flat  TMs, external ear canals normal                          Nose: septum midline, turbinates swollen, with erythema and clear discharge             Mouth/throat: Somewhat dry mucous membranes, tongue normal,  no pharyngeal erythema                           Neck: supple, no adenopathy, no thyromegaly, non tender                          Heart: RRR, normal S1, S2, no murmurs                         Lungs: Lower lung fields somewhat decreased, no wheezes, positive rhonchi, no positive egophony, no dullness to percussion Extremities: No calf pain, no calf swelling, no asymmetry negative Homans Abdomen: Soft, nontender       Assessment  Encounter Diagnoses  Name Primary?  . SOB (shortness of breath) Yes  . Nausea   . Cough   . COVID-19 virus infection   . Fatigue, unspecified type   . Asthma with acute exacerbation, unspecified asthma severity, unspecified whether persistent   . Uncontrolled type 2 diabetes mellitus with hyperglycemia ([place])       Plan: Her urinalysis showed positive ketones, elevated glucose in the high range, and clinically she doesn't appear well.    Looking through her chart, her last hemoglobin A1c was over 12%, 6 months ago over 13%.  She is uncontrolled and sugars have been high the past few weeks even into the 400s.   She had some other recent blood work in October which was reviewed, relatively unremarkable.  We discussed her symptoms, the fact that she has not improved at all, she has  had a positive Covid test and is high risk given her obesity, diabetes, hypertension, asthma.  Her blood sugars are running high, she is not seeming to get any improvement with asthma inhalers.  She appears to be somewhat dehydrated.  We discussed the limitations of this clinic.  I am unable  to do any lab work this evening given the lab pickup schedule has ended for the evening.  I think at minimum she needs a chest x-ray, needs probably some IV fluids and need to rule out DKA.   Given the fact that she needs other evaluation, our limitations here, and she is getting worse,  I asked her to go to the emergency department.  Her husband will take her now to [facility] emergency department.  Diagnoses and all orders for this visit:  SOB (shortness of breath) -     Cancel: DG Chest 2 View; Future  Nausea  Cough -     Cancel: DG Chest 2 View; Future  COVID-19 virus infection  Fatigue, unspecified type  Asthma with acute exacerbation, unspecified asthma severity, unspecified whether persistent  Uncontrolled type 2 diabetes mellitus with hyperglycemia (HCC) -     POCT Urinalysis Dipstick

## 2019-08-20 NOTE — ED Triage Notes (Signed)
Patient reports dx'd with COVID in early January.  Was at respiratory clinic today and since she wasn't getting any better was referred to ED for x-ray for possible COVID pneumonia and because she had ketones in her urine.

## 2019-08-21 ENCOUNTER — Ambulatory Visit (INDEPENDENT_AMBULATORY_CARE_PROVIDER_SITE_OTHER): Payer: 59

## 2019-08-21 ENCOUNTER — Other Ambulatory Visit: Payer: Self-pay

## 2019-08-21 ENCOUNTER — Encounter: Payer: Self-pay | Admitting: Gynecology

## 2019-08-21 ENCOUNTER — Ambulatory Visit (INDEPENDENT_AMBULATORY_CARE_PROVIDER_SITE_OTHER)
Admission: EM | Admit: 2019-08-21 | Discharge: 2019-08-21 | Disposition: A | Discharge status: Home / Self Care | Payer: 59 | Source: Home / Self Care | Attending: Emergency Medicine | Admitting: Emergency Medicine

## 2019-08-21 ENCOUNTER — Emergency Department
Admission: EM | Admit: 2019-08-21 | Discharge: 2019-08-21 | Disposition: A | Discharge status: Home / Self Care | Payer: 59 | Attending: Emergency Medicine | Admitting: Emergency Medicine

## 2019-08-21 DIAGNOSIS — R05 Cough: Secondary | ICD-10-CM | POA: Diagnosis not present

## 2019-08-21 DIAGNOSIS — J0140 Acute pansinusitis, unspecified: Secondary | ICD-10-CM

## 2019-08-21 DIAGNOSIS — M26623 Arthralgia of bilateral temporomandibular joint: Secondary | ICD-10-CM

## 2019-08-21 DIAGNOSIS — R0600 Dyspnea, unspecified: Secondary | ICD-10-CM

## 2019-08-21 DIAGNOSIS — U071 COVID-19: Secondary | ICD-10-CM

## 2019-08-21 DIAGNOSIS — R739 Hyperglycemia, unspecified: Secondary | ICD-10-CM | POA: Diagnosis not present

## 2019-08-21 MED ORDER — EPINEPHRINE 0.3 MG/0.3ML IJ SOAJ: 0.3000 mg | Freq: Once | INTRAMUSCULAR | 0 refills | Status: AC

## 2019-08-21 MED ORDER — NAPROXEN 500 MG PO TABS: 500.0000 mg | ORAL_TABLET | Freq: Two times a day (BID) | ORAL | 0 refills | Status: DC

## 2019-08-21 MED ORDER — CYCLOBENZAPRINE HCL 10 MG PO TABS: 10.0000 mg | ORAL_TABLET | Freq: Every day | ORAL | 0 refills | Status: DC

## 2019-08-21 MED ORDER — DOXYCYCLINE HYCLATE 100 MG PO CAPS: 100.0000 mg | ORAL_CAPSULE | Freq: Two times a day (BID) | ORAL | 0 refills | Status: AC

## 2019-08-21 NOTE — ED Notes (Signed)
Patient up to stat desk to ask about wait time. Patient informed of current place in line. Patient reported she was going to leave and go to urgent care tomorrow. Patient cautioned against the dangers of leaving AMA. Patient verbalized understanding. Patient left.

## 2019-08-21 NOTE — ED Provider Notes (Signed)
HPI  SUBJECTIVE:  Joan [name] is a 39 y.o. female who presents with persistent fevers T-max 101 this week, last fever was 2 days ago, but she has been taking Tylenol on a regular basis.  She reports persistent nonproductive cough, wheezing, chest soreness, tightness from the cough, clear nasal congestion/rhinorrhea.  Has continued headaches, body aches, sore throat.  She was diagnosed with Covid on 1/2.  She states that she is not getting any better, however she is not getting any worse.  Her shortness of breath has not changed recently.  She denies sinus pain or pressure, postnasal drip, purulent nasal congestion.  States that her sense of smell and taste have returned.  No nausea, vomiting, diarrhea, abdominal pain.  No urinary complaints.  She states her glucose was elevated yesterday at 406, normal ranges-200s.   States that she has been doing albuterol 2 puffs once daily with a spacer with mild improvement in her symptoms.  She is also taking Mucinex once daily.  No aggravating factors.  Seen in respiratory clinic yesterday for shortness of breath, referred to the ED to rule out pneumonia, DKA.  Labs were done.  Left due to wait time.  Glucose was 313, normal kidney function. no anion gap, CO2 normal.  No white count, no anemia.  urine with glucosuria, 5 ketones.  She also reports bilateral ear pain during the entire time of the illness.  She states that she does grind her teeth.  Patient has a history of uncontrolled diabetes - last A1c in October 2020 12.4, 13.9 in July 2020.  She takes insulin,  started this recently.  She is not on insulin sliding scale.  She also has a history of hypertension, TMJ arthralgia.  No history of DKA, coronary disease, CHF, frequent pneumonias, cancer, chronic kidney disease, peptic ulcer, GI bleed, DVT, PE.  LMP: Status post hysterectomy.  IZT:IWPYK, Leticia Penna, NP    Past Medical History:  Diagnosis Date  . Asthma   . Auditory hallucinations    only  after anesthesia  . Diabetes mellitus without complication (HCC)    diet controlled  . Diabetes mellitus, type II (HCC)    insulin, jardiance  . Dyspnea    with exertion  . Essential hypertension   . Essential hypertension 11/24/2017  . GERD (gastroesophageal reflux disease)    occasionally-NO MEDS  . History of placement of ear tubes 05/20/2018  . MDD (major depressive disorder)   . Miscarriage   . PTSD (post-traumatic stress disorder)   . Tachycardia     Past Surgical History:  Procedure Laterality Date  . ABDOMINAL HYSTERECTOMY    . ADENOIDECTOMY    . CHOLECYSTECTOMY  2009  . CYSTOSCOPY N/A 07/31/2018   Procedure: CYSTOSCOPY;  Surgeon: Benjaman Kindler, MD;  Location: [facility] ORS;  Service: Gynecology;  Laterality: N/A;  . LAPAROSCOPIC BILATERAL SALPINGECTOMY Bilateral 07/31/2018   Procedure: LAPAROSCOPIC BILATERAL SALPINGECTOMY;  Surgeon: Benjaman Kindler, MD;  Location: [facility] ORS;  Service: Gynecology;  Laterality: Bilateral;  . LAPAROSCOPIC HYSTERECTOMY N/A 07/31/2018   Procedure: HYSTERECTOMY TOTAL LAPAROSCOPIC;  Surgeon: Benjaman Kindler, MD;  Location: [facility] ORS;  Service: Gynecology;  Laterality: N/A;  . LAPAROSCOPY N/A 06/07/2019   Procedure: LAPAROSCOPY OPERATIVE, WITH PERITONEAL BIOPSIES;  Surgeon: Benjaman Kindler, MD;  Location: [facility] ORS;  Service: Gynecology;  Laterality: N/A;  . LYSIS OF ADHESION N/A 07/31/2018   Procedure: LYSIS OF ADHESION;  Surgeon: Benjaman Kindler, MD;  Location: [facility] ORS;  Service: Gynecology;  Laterality: N/A;  . OVARY SURGERY Right  cyst removed a while ago  . TONSILLECTOMY    . tubes in ear      Family History  Problem Relation Age of Onset  . Asthma Mother   . Diabetes Mother   . Hyperlipidemia Mother   . Hypertension Mother   . Diabetes Father   . Hyperlipidemia Father   . Hypertension Father   . Diabetes Brother   . Depression Brother   . Alcohol abuse Brother   . Depression Maternal Grandmother   . Stroke Paternal  Grandmother   . Breast cancer Neg Hx     Social History   Tobacco Use  . Smoking status: Never Smoker  . Smokeless tobacco: Never Used  Substance Use Topics  . Alcohol use: No  . Drug use: No    No current facility-administered medications for this encounter.  Current Outpatient Medications:  .  albuterol (VENTOLIN HFA) 108 (90 Base) MCG/ACT inhaler, INHALE 2 PUFFS BY MOUTH EVERY 6 HOURS AS NEEDED FOR SHORTNESS OF BREATH AND WHEEZING (Patient taking differently: Inhale 2 puffs into the lungs every 6 (six) hours as needed for wheezing or shortness of breath. INHALE 2 PUFFS BY MOUTH EVERY 6 HOURS AS NEEDED FOR SHORTNESS OF BREATH AND WHEEZING), Disp: 18 g, Rfl: 0 .  blood glucose meter kit and supplies KIT, Dispense based on patient and insurance preference. Use up three times daily as directed. (FOR ICD-9 250.00, 250.01)., Disp: 1 each, Rfl: 0 .  Dulaglutide (TRULICITY) 2.97 LG/9.2JJ SOPN, Inject once weekly for diabetes., Disp: 2 mL, Rfl: 3 .  insulin glargine (LANTUS) 100 UNIT/ML injection, Inject 0.3 mLs (30 Units total) into the skin at bedtime., Disp: 10 mL, Rfl: 0 .  Insulin Pen Needle (PEN NEEDLES) 31G X 6 MM MISC, Use with insulin as directed., Disp: 100 each, Rfl: 2 .  losartan (COZAAR) 100 MG tablet, Take 100 mg by mouth daily., Disp: , Rfl:  .  montelukast (SINGULAIR) 10 MG tablet, Take 10 mg by mouth at bedtime., Disp: , Rfl:  .  cyclobenzaprine (FLEXERIL) 10 MG tablet, Take 1 tablet (10 mg total) by mouth at bedtime., Disp: 20 tablet, Rfl: 0 .  doxycycline (VIBRAMYCIN) 100 MG capsule, Take 1 capsule (100 mg total) by mouth 2 (two) times daily for 7 days., Disp: 14 capsule, Rfl: 0 .  EPINEPHrine 0.3 mg/0.3 mL IJ SOAJ injection, Inject 0.3 mLs (0.3 mg total) into the muscle once for 1 dose., Disp: 1 each, Rfl: 0 .  naproxen (NAPROSYN) 500 MG tablet, Take 1 tablet (500 mg total) by mouth 2 (two) times daily., Disp: 20 tablet, Rfl: 0  Allergies  Allergen Reactions  . Ibuprofen  Swelling    Facial   . Ciprofloxacin Hives and Rash  . Metformin And Related Other (See Comments)    Elevated Lactic Acid  . Penicillins Hives and Rash    Has patient had a PCN reaction causing immediate rash, facial/tongue/throat swelling, SOB or lightheadedness with hypotension: No Has patient had a PCN reaction causing severe rash involving mucus membranes or skin necrosis: No Has patient had a PCN reaction that required hospitalization: No Has patient had a PCN reaction occurring within the last 10 years: No If all of the above answers are "NO", then may proceed with Cephalosporin use. THE PATIENT IS ABLE TO TOLERATE CEPHALOSPORINS WITHOUT DIFFIC  . Sulfa Antibiotics Hives and Rash  . Other     ALL NUTS-SCRATCHY THROAT  . Shellfish Allergy Other (See Comments)    ALL SEAFOOD-SCRATCHY THROAT  ROS  As noted in HPI.   Physical Exam  BP 128/79 (BP Location: Left Arm)   Pulse (!) 109   Temp 98.6 F (37 C) (Oral)   Resp 18   Ht _0  (1.676 m)   Wt 132.5 kg   LMP 07/24/2018 (Exact Date) Comment: surgery 07/31/2018  SpO2 98%   BMI 47.13 kg/m   Constitutional: Well developed, well nourished, no acute distress Eyes: PERRL, EOMI, conjunctiva normal bilaterally HENT: Normocephalic, atraumatic,mucus membranes moist.  Positive nasal congestion, erythematous but not swollen turbinates.  Positive maxillary and frontal sinus tenderness.  TMs normal bilaterally.  Bilateral TMJ tenderness, states this reproduces her ear pain. Respiratory: Clear to auscultation bilaterally, no rales, no wheezing, no rhonchi.  Positive anterior and lateral chest wall tenderness Cardiovascular: Regular tachycardia, no murmurs, no gallops, no rubs GI: Nondistended skin: No rash, skin intact Musculoskeletal:  no deformities Neurologic: Alert & oriented x 3, CN III-XII grossly intact, no motor deficits, sensation grossly intact Psychiatric: Speech and behavior appropriate   ED Course   Medications  - No data to display  Orders Placed This Encounter  Procedures  . DG Chest 2 View    Standing Status:   Standing    Number of Occurrences:   1    Order Specific Question:   Reason for Exam (SYMPTOM  OR DIAGNOSIS REQUIRED)    Answer:   r/o PNA, other acute changes   Results for orders placed or performed during the hospital encounter of 08/21/19 (from the past 24 hour(s))  CBC with Differential     Status: Abnormal   Collection Time: 08/20/19 10:39 PM  Result Value Ref Range   WBC 4.7 4.0 - 10.5 K/uL   RBC 5.21 (H) 3.87 - 5.11 MIL/uL   Hemoglobin 14.4 12.0 - 15.0 g/dL   HCT 42.8 36.0 - 46.0 %   MCV 82.1 80.0 - 100.0 fL   MCH 27.6 26.0 - 34.0 pg   MCHC 33.6 30.0 - 36.0 g/dL   RDW 12.7 11.5 - 15.5 %   Platelets 213 150 - 400 K/uL   nRBC 0.0 0.0 - 0.2 %   Neutrophils Relative % 48 %   Neutro Abs 2.2 1.7 - 7.7 K/uL   Lymphocytes Relative 44 %   Lymphs Abs 2.1 0.7 - 4.0 K/uL   Monocytes Relative 5 %   Monocytes Absolute 0.3 0.1 - 1.0 K/uL   Eosinophils Relative 2 %   Eosinophils Absolute 0.1 0.0 - 0.5 K/uL   Basophils Relative 1 %   Basophils Absolute 0.0 0.0 - 0.1 K/uL   Immature Granulocytes 0 %   Abs Immature Granulocytes 0.01 0.00 - 0.07 K/uL  Basic metabolic panel     Status: Abnormal   Collection Time: 08/20/19 10:39 PM  Result Value Ref Range   Sodium 132 (L) 135 - 145 mmol/L   Potassium 4.2 3.5 - 5.1 mmol/L   Chloride 99 98 - 111 mmol/L   CO2 23 22 - 32 mmol/L   Glucose, Bld 313 (H) 70 - 99 mg/dL   BUN 15 6 - 20 mg/dL   Creatinine, Ser 0.56 0.44 - 1.00 mg/dL   Calcium 9.1 8.9 - 10.3 mg/dL   GFR calc non Af Amer >60 >60 mL/min   GFR calc Af Amer >60 >60 mL/min   Anion gap 10 5 - 15  Urinalysis, Routine w reflex microscopic     Status: Abnormal   Collection Time: 08/20/19 11:06 PM  Result Value Ref Range  Color, Urine YELLOW (A) YELLOW   APPearance HAZY (A) CLEAR   Specific Gravity, Urine 1.030 1.005 - 1.030   pH 5.0 5.0 - 8.0   Glucose, UA >=500 (A) NEGATIVE  mg/dL   Hgb urine dipstick NEGATIVE NEGATIVE   Bilirubin Urine NEGATIVE NEGATIVE   Ketones, ur 5 (A) NEGATIVE mg/dL   Protein, ur NEGATIVE NEGATIVE mg/dL   Nitrite NEGATIVE NEGATIVE   Leukocytes,Ua NEGATIVE NEGATIVE   RBC / HPF 21-50 0 - 5 RBC/hpf   WBC, UA 0-5 0 - 5 WBC/hpf   Bacteria, UA RARE (A) NONE SEEN   Squamous Epithelial / LPF 6-10 0 - 5   Mucus PRESENT    DG Chest 2 View  Result Date: 08/21/2019 CLINICAL DATA:  Cough. EXAM: CHEST - 2 VIEW COMPARISON:  June 29, 2019. FINDINGS: The heart size and mediastinal contours are within normal limits. Both lungs are clear. The visualized skeletal structures are unremarkable. IMPRESSION: No active cardiopulmonary disease. Electronically Signed   By: Marijo Conception M.D.   On: 08/21/2019 11:45    ED Clinical Impression  1. COVID-19 virus infection   2. Dyspnea, unspecified type   3. Hyperglycemia   4. Bilateral temporomandibular joint pain   5. Acute non-recurrent pansinusitis      ED Assessment/Plan  Outside records, labs reviewed.  As noted in HPI and below.  While glucose is elevated she does have urine ketones, she has a normal serum bicarbonate.  Suspect poorly controlled diabetes or infection rather than DKA.  Reviewed imaging independently and discussed with radiologist.  No pneumonia. See radiology report for full details.  Will not repeat labs, they were done about 12 hours ago.   COVID-19 infection: with a secondary sinusitis.  She has continued dyspnea which I suspect is from the Covid infection.  She has no pneumonia on x-ray, however will treat as if she has pneumonia with doxycycline which will also cover her sinusitis.  We will have her do regularly scheduled albuterol using her spacer.  2 puffs every 4 hours for 2 days, 2 puffs every 6 hours for 2 days, then as needed.  She states that she does not need a prescription for either one of them.  She is to continue Mucinex, saline nasal irrigation.  She also has  bilateral temporomandibular joint tenderness states that she grinds her teeth frequently.  Will send home with Naprosyn.  Patient reports facial swelling only with ibuprofen and is willing to try Naprosyn.  Had a long discussion with patient about this, but she denies anaphylaxis, lip or throat swelling with the ibuprofen.   States that she has an EpiPen at home, but will send her home with another one.  Advised her to use the EpiPen and take 50 mg of Benadryl and go immediately to the ER for signs of anaphylaxis.  Also send her home with some Flexeril.  Work note for Monday and Tuesday.  She may go to work if she feels well.  Not working over the weekend. she is to follow-up with her primary care physician in 3 days, to the ER if she gets worse.  Gave her strict ER return precautions.  Unable to prescribe Augmentin or Levaquin for pneumonia as she has risk factors but she gets hives to both.  Discussed labs, imaging, MDM, treatment plan, and plan for follow-up with patient Discussed sn/sx that should prompt return to the ED. patient agrees with plan.   Meds ordered this encounter  Medications  . EPINEPHrine  0.3 mg/0.3 mL IJ SOAJ injection    Sig: Inject 0.3 mLs (0.3 mg total) into the muscle once for 1 dose.    Dispense:  1 each    Refill:  0  . cyclobenzaprine (FLEXERIL) 10 MG tablet    Sig: Take 1 tablet (10 mg total) by mouth at bedtime.    Dispense:  20 tablet    Refill:  0  . naproxen (NAPROSYN) 500 MG tablet    Sig: Take 1 tablet (500 mg total) by mouth 2 (two) times daily.    Dispense:  20 tablet    Refill:  0  . doxycycline (VIBRAMYCIN) 100 MG capsule    Sig: Take 1 capsule (100 mg total) by mouth 2 (two) times daily for 7 days.    Dispense:  14 capsule    Refill:  0    *This clinic note was created using [employer]. Therefore, there may be occasional mistakes despite careful proofreading.  ?    Melynda Ripple, MD 08/22/19 [phone]

## 2019-08-21 NOTE — ED Triage Notes (Signed)
Patient was see at the respiratory clinic x yesterday and was referred to the ER for possible pneumonia. Pt. Stated wait was too long at the ER and left. Per patient urine test at the respiratory clinic show ketones. Per patient was triage at the ER and blood work and urine was done.

## 2019-08-21 NOTE — Discharge Instructions (Addendum)
Finish the doxycycline, even if you feel better.  This will treat a pneumonia as well as a sinus infection.  Continue Mucinex, start saline nasal irrigation with a Lloyd Huger med rinse and distilled water as often as you want.  Regularly scheduled albuterol.  2 puffs from your inhaler using your spacer every 4 hours for 2 days, then 2 puffs every 3 6 hours for 2 days, then as needed.  It may take some time for the shortness of breath to resolve, but you should not be getting worse.  Temporomandibular joint pain: Naprosyn twice a day, Flexeril at night.  Soft diet for the next few days.  If you have any signs of an allergic reaction to the Naprosyn, use the EpiPen, take 50 mg of Benadryl and go immediately to the ER as we discussed.

## 2019-08-25 NOTE — Telephone Encounter (Signed)
Closing out chart note

## 2019-08-30 ENCOUNTER — Other Ambulatory Visit: Payer: Self-pay

## 2019-08-30 ENCOUNTER — Ambulatory Visit (INDEPENDENT_AMBULATORY_CARE_PROVIDER_SITE_OTHER): Payer: 59 | Admitting: Family Medicine

## 2019-08-30 ENCOUNTER — Ambulatory Visit (INDEPENDENT_AMBULATORY_CARE_PROVIDER_SITE_OTHER): Payer: 59 | Admitting: Medical

## 2019-08-30 ENCOUNTER — Encounter: Payer: Self-pay | Admitting: Family Medicine

## 2019-08-30 ENCOUNTER — Encounter: Payer: Self-pay | Admitting: Medical

## 2019-08-30 VITALS — BP 136/100 | HR 121 | Temp 98.4°F | Resp 12 | Ht 66.0 in | Wt 298.0 lb

## 2019-08-30 VITALS — Temp 101.0°F | Ht 66.0 in | Wt 291.0 lb

## 2019-08-30 DIAGNOSIS — A09 Infectious gastroenteritis and colitis, unspecified: Secondary | ICD-10-CM

## 2019-08-30 DIAGNOSIS — R52 Pain, unspecified: Secondary | ICD-10-CM | POA: Diagnosis not present

## 2019-08-30 DIAGNOSIS — E1165 Type 2 diabetes mellitus with hyperglycemia: Secondary | ICD-10-CM | POA: Diagnosis not present

## 2019-08-30 DIAGNOSIS — R000 Tachycardia, unspecified: Secondary | ICD-10-CM | POA: Diagnosis not present

## 2019-08-30 DIAGNOSIS — I10 Essential (primary) hypertension: Secondary | ICD-10-CM | POA: Diagnosis not present

## 2019-08-30 DIAGNOSIS — R5383 Other fatigue: Secondary | ICD-10-CM | POA: Diagnosis not present

## 2019-08-30 MED ORDER — METOPROLOL TARTRATE 25 MG PO TABS: 25.0000 mg | ORAL_TABLET | Freq: Two times a day (BID) | ORAL | 0 refills | Status: DC

## 2019-08-30 MED ORDER — OSELTAMIVIR PHOSPHATE 75 MG PO CAPS: 75.0000 mg | ORAL_CAPSULE | Freq: Two times a day (BID) | ORAL | 0 refills | Status: DC

## 2019-08-30 NOTE — Progress Notes (Signed)
I connected with Joan [name] on 08/30/19 at  4:00 PM EST by video and verified that I am speaking with the correct person using two identifiers.   I discussed the limitations, risks, security and privacy concerns of performing an evaluation and management service by video and the availability of in person appointments. I also discussed with the patient that there may be a patient responsible charge related to this service. The patient expressed understanding and agreed to proceed.  Patient location: Home Provider Location: [facility] Participants: Joan [name] and Joan [name]   Subjective:     Joan [name] is a 39 y.o. female presenting for Acute Home Visit (Diarrhea, body aches, fever X1 day )     HPI   #Diarrhea - body aches - fever - 100-101 - sore throat - works at a daycare - so not sure - 3-4 loose stools - endorse sore throat - no loss of taste or smell - no known sick contact - has not been following a specific diet   - got a flu shot this year - [facility] - was tested negative for flu/strep throat which were normal  Had covid 1/2 Had pneumonia - took antibiotics, was feeling better   Review of Systems  HENT: Positive for congestion and sore throat.   Respiratory: Positive for cough.   Gastrointestinal: Positive for diarrhea and nausea. Negative for abdominal pain and vomiting.  Genitourinary: Negative for dysuria.  Neurological: Positive for headaches.     Social History   Tobacco Use  Smoking Status Never Smoker  Smokeless Tobacco Never Used        Objective:   BP Readings from Last 3 Encounters:  08/21/19 128/79  08/20/19 (!) 141/93  08/20/19 140/82   Wt Readings from Last 3 Encounters:  08/30/19 291 lb (132 kg)  08/21/19 292 lb (132.5 kg)  08/20/19 292 lb (132.5 kg)   Temp (!) 101 F (38.3 C) (Oral)   Ht 5\' 6"  (1.676 m)   Wt 291 lb (132 kg)   LMP 07/24/2018 (Exact Date) Comment: surgery  07/31/2018  BMI 46.97 kg/m    Physical Exam Constitutional:      Appearance: Normal appearance. She is not ill-appearing.  HENT:     Head: Normocephalic and atraumatic.     Right Ear: External ear normal.     Left Ear: External ear normal.  Eyes:     Conjunctiva/sclera: Conjunctivae normal.  Pulmonary:     Effort: Pulmonary effort is normal. No respiratory distress.  Neurological:     Mental Status: She is alert. Mental status is at baseline.  Psychiatric:        Mood and Affect: Mood normal.        Behavior: Behavior normal.        Thought Content: Thought content normal.        Judgment: Judgment normal.             Assessment & Plan:   Problem List Items Addressed This Visit      Endocrine   Diabetes mellitus type 2, uncontrolled (HCC)    Other Visit Diagnoses    Acute infectious diarrhea    -  Primary     Pt with previous covid infection >20 days ago s/p treatment with abx for pneumonia now with diarrhea, new respiratory symptoms, and new fever.   DDx infectious diarrhea (including c.diff), bacteremia, recurrent pneumonia/sinusitis.   Given risk factors (  poorly controlled diabetes) discussed getting blood work, given new fever and restrictions with getting labs at our office, directed her to the respiratory clinic for in-person exam and labs.  Was planning: CMP, CBC, could recheck HgbA1c given last was >3 months ago, Stool pathogens to include C diff testing. Based on exam could consider repeat CXR though prior XR was recent so would not expect resolution.    Return if symptoms worsen or fail to improve.  Lesleigh Noe, MD

## 2019-08-30 NOTE — Progress Notes (Signed)
Subjective: Chief Complaint  Patient presents with  . covid positive   Here for recheck.   I saw her 08/20/2019 in this clinic for covid +, cough, sob, diabetes uncontrolled feeling horrible.  That evening we advised to be seen in the ED .  She went [facility], waited for 5 hours, hard to breath, was told 10-12 hour wait.  Ended up leaving, going to urgent care the next day.   Went to urgent care in [place]. Chest xray reportedly ok, but was treated for pneumonia.   Was put on doxycycline.   Started doxycyline on 08/21/2019.    Went back to work last week 08/25/2019, had felt good for a few days.   Then last night felt illness symptoms hit her again like a ton of bricks.   Has had diarrhea, sore throat, headache, body aches, fever around 101, chills.  [facility], had flu swab, had strep swab, was negative for both.  Currently no chest pain, not SOB.  Main symptoms are aches, headaches, sore throat, loose stool x 1 day.  No calve pain.  No recent long travel  Diabetes  -  sugars running high.  Using Lantus 30u morning, 50u night.   Using [place] weekly   Getting 145-178 fasting but 400s at bedtime.     Pulse running high, but this has been the same for years, has always been tachycardic.     Compliant with symbicort but hasn't felt SOB to need albuterol in last week.   Past Medical History:  Diagnosis Date  . Asthma   . Auditory hallucinations    only after anesthesia  . Diabetes mellitus without complication (HCC)    diet controlled  . Diabetes mellitus, type II (HCC)    insulin, jardiance  . Dyspnea    with exertion  . Essential hypertension   . Essential hypertension 11/24/2017  . GERD (gastroesophageal reflux disease)    occasionally-NO MEDS  . History of placement of ear tubes 05/20/2018  . MDD (major depressive disorder)   . Miscarriage   . PTSD (post-traumatic stress disorder)   . Tachycardia    Current Outpatient Medications on File Prior to  Visit  Medication Sig Dispense Refill  . albuterol (VENTOLIN HFA) 108 (90 Base) MCG/ACT inhaler INHALE 2 PUFFS BY MOUTH EVERY 6 HOURS AS NEEDED FOR SHORTNESS OF BREATH AND WHEEZING (Patient taking differently: Inhale 2 puffs into the lungs every 6 (six) hours as needed for wheezing or shortness of breath. INHALE 2 PUFFS BY MOUTH EVERY 6 HOURS AS NEEDED FOR SHORTNESS OF BREATH AND WHEEZING) 18 g 0  . blood glucose meter kit and supplies KIT Dispense based on patient and insurance preference. Use up three times daily as directed. (FOR ICD-9 250.00, 250.01). 1 each 0  . cyclobenzaprine (FLEXERIL) 10 MG tablet Take 1 tablet (10 mg total) by mouth at bedtime. 20 tablet 0  . Dulaglutide (TRULICITY) 9.24 MQ/2.8MN SOPN Inject once weekly for diabetes. 2 mL 3  . insulin glargine (LANTUS) 100 UNIT/ML injection Inject 0.3 mLs (30 Units total) into the skin at bedtime. 10 mL 0  . Insulin Pen Needle (PEN NEEDLES) 31G X 6 MM MISC Use with insulin as directed. 100 each 2  . losartan (COZAAR) 100 MG tablet Take 100 mg by mouth daily.    . montelukast (SINGULAIR) 10 MG tablet Take 10 mg by mouth at bedtime.    . naproxen (NAPROSYN) 500 MG tablet Take 1 tablet (500 mg total)  by mouth 2 (two) times daily. 20 tablet 0   No current facility-administered medications on file prior to visit.   ROS as in subjective   Objective: BP (!) 136/100 (BP Location: Left Arm, Patient Position: Sitting, Cuff Size: Large)   Pulse (!) 121   Temp 98.4 F (36.9 C)   Resp 12   Ht '5\' 6"'  (1.676 m)   Wt 298 lb (135.2 kg)   LMP 07/24/2018 (Exact Date) Comment: surgery 07/31/2018  SpO2 98% Comment: at rest  BMI 48.10 kg/m   Wt Readings from Last 3 Encounters:  08/30/19 298 lb (135.2 kg)  08/30/19 291 lb (132 kg)  08/21/19 292 lb (132.5 kg)   BP Readings from Last 3 Encounters:  08/30/19 (!) 136/100  08/21/19 128/79  08/20/19 (!) 141/93    General appearence: alert, no distress, WD/WN, not particularly ill  appearing HEENT: normocephalic, sclerae anicteric, TMs pearly, nares patent, no discharge or erythema, pharynx normal Oral cavity: MMM, no lesions Neck: supple, no lymphadenopathy, no thyromegaly, no masses Heart: tachycardic, otherwise RRR, normal S1, S2, no murmurs Lungs: CTA bilaterally, no wheezes, rhonchi, or rales Abdomen: +bs, soft, non tender, non distended, no masses, no hepatomegaly, no splenomegaly Pulses: 2+ symmetric, upper and lower extremities, normal cap refill No calve tendnerss, no asymmetry, no swelling, -homans     Assessment: Encounter Diagnoses  Name Primary?  . Tachycardia Yes  . Body aches   . Fatigue, unspecified type   . Essential hypertension   . Uncontrolled type 2 diabetes mellitus with hyperglycemia ([place])     Plan: discussed recent ED visit,discussed last visit here, glucose readings, symptoms.  Discussed recommendations and instructions as below.   Patient Instructions  We will check thyroid lab this evening.  Continue to hydrate well with water and clear non surgery fluids   In the event you have contracted influenza, begin Tamiflu.  Quarantine for now for the next 3-4 days.  Lets see if your symptom change or worsen over the next few days.   Let your PCP know   Elevated blood pressure and pulse:  Begin Metoprolol 40m twice daily for blood pressure and to slow down heart rate.  Continue Losartan 1088mdaily  Limit or avoid added salt  Limit ibuprofen and other anti-inflammatories (naprosyn, aleve, motrin, etc)   Asthma:  Continue Symbicort twice daily.  Rinse mouth out with water after use.   Continue Albuterol rescue inhaler, 2 puffs every 4-6 hours as needed for wheezing    Diabetes:  Continue the [place]  Continue to check sugars fasting in the morning, but this week get some readings before lunch and dinner.  Write these down for your primary provider  For now continue the Lantus, but you may need adjustment or  adding fast acting insulin at meal times         [place] seen today for covid positive.  Diagnoses and all orders for this visit:  Tachycardia -     Cancel: TSH -     TSH; Future -     TSH  Body aches -     Cancel: TSH -     TSH; Future -     TSH  Fatigue, unspecified type -     Cancel: TSH -     TSH; Future -     TSH  Essential hypertension  Uncontrolled type 2 diabetes mellitus with hyperglycemia (HCC)  Other orders -     metoprolol tartrate (LOPRESSOR) 25 MG tablet; Take  1 tablet (25 mg total) by mouth 2 (two) times daily. -     oseltamivir (TAMIFLU) 75 MG capsule; Take 1 capsule (75 mg total) by mouth 2 (two) times daily.

## 2019-08-30 NOTE — Patient Instructions (Addendum)
We will check thyroid lab this evening.  Continue to hydrate well with water and clear non surgery fluids   In the event you have contracted influenza, begin Tamiflu.  Quarantine for now for the next 3-4 days.  Lets see if your symptom change or worsen over the next few days.   Let your PCP know   Elevated blood pressure and pulse:  Begin Metoprolol 25mg  twice daily for blood pressure and to slow down heart rate.  Continue Losartan 100mg  daily  Limit or avoid added salt  Limit ibuprofen and other anti-inflammatories (naprosyn, aleve, motrin, etc)   Asthma:  Continue Symbicort twice daily.  Rinse mouth out with water after use.   Continue Albuterol rescue inhaler, 2 puffs every 4-6 hours as needed for wheezing    Diabetes:  Continue the weekly  Continue to check sugars fasting in the morning, but this week get some readings before lunch and dinner.  Write these down for your primary provider  For now continue the Lantus, but you may need adjustment or adding fast acting insulin at meal times

## 2019-08-31 NOTE — Telephone Encounter (Signed)
Will you please provide her with fax information?

## 2019-09-01 LAB — SPECIMEN STATUS

## 2019-09-02 LAB — TSH: TSH: 4.04 u[IU]/mL (ref 0.450–4.500)

## 2019-09-02 LAB — SPECIMEN STATUS REPORT

## 2019-09-03 ENCOUNTER — Encounter: Payer: Self-pay | Admitting: Primary Care

## 2019-09-03 ENCOUNTER — Ambulatory Visit: Payer: 59 | Admitting: Primary Care

## 2019-09-03 ENCOUNTER — Other Ambulatory Visit: Payer: Self-pay

## 2019-09-03 VITALS — BP 136/86 | HR 91 | Temp 96.0°F | Ht 66.0 in | Wt 297.2 lb

## 2019-09-03 DIAGNOSIS — I10 Essential (primary) hypertension: Secondary | ICD-10-CM | POA: Diagnosis not present

## 2019-09-03 DIAGNOSIS — E1165 Type 2 diabetes mellitus with hyperglycemia: Secondary | ICD-10-CM | POA: Diagnosis not present

## 2019-09-03 DIAGNOSIS — R000 Tachycardia, unspecified: Secondary | ICD-10-CM

## 2019-09-03 DIAGNOSIS — E785 Hyperlipidemia, unspecified: Secondary | ICD-10-CM | POA: Diagnosis not present

## 2019-09-03 DIAGNOSIS — F4310 Post-traumatic stress disorder, unspecified: Secondary | ICD-10-CM

## 2019-09-03 LAB — POCT GLYCOSYLATED HEMOGLOBIN (HGB A1C): Hemoglobin A1C: 11.7 % — AB (ref 4.0–5.6)

## 2019-09-03 MED ORDER — FREESTYLE LIBRE 14 DAY READER DEVI: 1.0000 | Freq: Three times a day (TID) | 0 refills | Status: DC | PRN

## 2019-09-03 MED ORDER — TRULICITY 1.5 MG/0.5ML ~~LOC~~ SOAJ: 1.5000 mg | SUBCUTANEOUS | 2 refills | Status: DC

## 2019-09-03 MED ORDER — LOSARTAN POTASSIUM 25 MG PO TABS: 25.0000 mg | ORAL_TABLET | Freq: Every day | ORAL | 0 refills | Status: DC

## 2019-09-03 MED ORDER — NOVOLOG FLEXPEN 100 UNIT/ML ~~LOC~~ SOPN: 8.0000 [IU] | PEN_INJECTOR | Freq: Three times a day (TID) | SUBCUTANEOUS | 3 refills | Status: DC

## 2019-09-03 MED ORDER — FREESTYLE LIBRE 14 DAY SENSOR MISC: 1.0000 | Freq: Three times a day (TID) | 2 refills | Status: DC | PRN

## 2019-09-03 NOTE — Progress Notes (Signed)
Subjective:    Patient ID: Joan [name], female    DOB: [birth year], 39 y.o.   MRN: [id number]  HPI  This visit occurred during the SARS-CoV-2 public health emergency.  Safety protocols were in place, including screening questions prior to the visit, additional usage of staff PPE, and extensive cleaning of exam room while observing appropriate contact time as indicated for disinfecting solutions.    Ms. Vosler is a 39 year old female with a history of uncontrolled diabetes, hypertension, asthma, recurrent viral URI infections, influenza, Covid-19 who presents today for follow up of diabetes.  1) Type 2 Diabetes: Current medications include: Lantus, Trulicity 0.05 mg weekly. She is injecting Lantus 30 units in the morning and 50 units at night which she started 2 months ago. She had not been checking her blood sugars until 10 days ago.   She is checking her blood glucose 0-3 times daily and is getting readings of over the last 10 days:  AM fasting: 187, 147, 150, 295, 147, 190, 340 Before dinner: 350, 450, 475, 480, 420 2 hours after dinner: 480, 400, 490, 460, 475, 480  Last A1C: 12.4 in October 2020, 11.7 today Last Eye Exam: Completed in January 2021, will be seeing a retinal specialist.  Last Foot Exam: Due Pneumonia Vaccination: Completed in 2017 ACE/ARB: Losartan  Statin: None  Diet currently consists of:  Breakfast: Baglels and cream cheese Lunch: Pasta, salad, rice, fast food Dinner: Fast food, pasta, some grilled chicken Snacks: Very little Desserts: Cinnamon rolls, cakes, cookies Beverages: Slushies, water (recently), orange juice, Gatorade Zero  Exercise: She is not exercising   BP Readings from Last 3 Encounters:  09/03/19 136/86  08/30/19 (!) 136/100  08/21/19 128/79   2) Hypertension/Tachycardia: She is checking her BP at home which has been 130's/90's. She stopped her losartan months ago. She is compliant to her metoprolol tartrate 25 mg that was initiated  earlier this week for tachycardia.  3) PTSD/Insomnia/Anxiety: Chronic for years. Previously following with psychiatry, no recent visit and has been off medications for months. She was previously taking Abilify, Belsomra, citalopram, clonazepam, doxepin, zolpidem, Trazodone at various times.   She is currently having difficulty falling and staying asleep nightly. She's tried Melatonin but this caused nightmares. She is taking Benadryl now which works well. She tried Trazodone in the past which worked but caused her to hear voices. She thinks she has a prescription at home for hydroxyzine per her allergist, she has not tried taking this HS. Overall she feels her PTSD symptoms and anxiety is under control off medications.   Review of Systems  Eyes: Positive for visual disturbance.  Respiratory: Negative for shortness of breath.   Cardiovascular: Negative for chest pain.  Neurological: Negative for dizziness and headaches.  Psychiatric/Behavioral: Positive for sleep disturbance. The patient is not nervous/anxious.        Past Medical History:  Diagnosis Date  . Asthma   . Auditory hallucinations    only after anesthesia  . Diabetes mellitus without complication (HCC)    diet controlled  . Diabetes mellitus, type II (HCC)    insulin, jardiance  . Dyspnea    with exertion  . Essential hypertension   . Essential hypertension 11/24/2017  . GERD (gastroesophageal reflux disease)    occasionally-NO MEDS  . History of placement of ear tubes 05/20/2018  . MDD (major depressive disorder)   . Miscarriage   . PTSD (post-traumatic stress disorder)   . Tachycardia  Social History   Socioeconomic History  . Marital status: Married    Spouse name: chip  . Number of children: 0  . Years of education: Not on file  . Highest education level: High school graduate  Occupational History  . Occupation: day care worker  Tobacco Use  . Smoking status: Never Smoker  . Smokeless tobacco: Never  Used  Substance and Sexual Activity  . Alcohol use: No  . Drug use: No  . Sexual activity: Yes  Other Topics Concern  . Not on file  Social History Narrative  . Not on file   Social Determinants of Health   Financial Resource Strain:   . Difficulty of Paying Living Expenses: Not on file  Food Insecurity:   . Worried About Charity fundraiser in the Last Year: Not on file  . Ran Out of Food in the Last Year: Not on file  Transportation Needs:   . Lack of Transportation (Medical): Not on file  . Lack of Transportation (Non-Medical): Not on file  Physical Activity:   . Days of Exercise per Week: Not on file  . Minutes of Exercise per Session: Not on file  Stress:   . Feeling of Stress : Not on file  Social Connections:   . Frequency of Communication with Friends and Family: Not on file  . Frequency of Social Gatherings with Friends and Family: Not on file  . Attends Religious Services: Not on file  . Active Member of Clubs or Organizations: Not on file  . Attends Archivist Meetings: Not on file  . Marital Status: Not on file  Intimate Partner Violence:   . Fear of Current or Ex-Partner: Not on file  . Emotionally Abused: Not on file  . Physically Abused: Not on file  . Sexually Abused: Not on file    Past Surgical History:  Procedure Laterality Date  . ABDOMINAL HYSTERECTOMY    . ADENOIDECTOMY    . CHOLECYSTECTOMY  2009  . CYSTOSCOPY N/A 07/31/2018   Procedure: CYSTOSCOPY;  Surgeon: Benjaman Kindler, MD;  Location: [facility];  Service: Gynecology;  Laterality: N/A;  . LAPAROSCOPIC BILATERAL SALPINGECTOMY Bilateral 07/31/2018   Procedure: LAPAROSCOPIC BILATERAL SALPINGECTOMY;  Surgeon: Benjaman Kindler, MD;  Location: [facility];  Service: Gynecology;  Laterality: Bilateral;  . LAPAROSCOPIC HYSTERECTOMY N/A 07/31/2018   Procedure: HYSTERECTOMY TOTAL LAPAROSCOPIC;  Surgeon: Benjaman Kindler, MD;  Location: [facility];  Service: Gynecology;  Laterality: N/A;  .  LAPAROSCOPY N/A 06/07/2019   Procedure: LAPAROSCOPY OPERATIVE, WITH PERITONEAL BIOPSIES;  Surgeon: Benjaman Kindler, MD;  Location: [facility];  Service: Gynecology;  Laterality: N/A;  . LYSIS OF ADHESION N/A 07/31/2018   Procedure: LYSIS OF ADHESION;  Surgeon: Benjaman Kindler, MD;  Location: [facility];  Service: Gynecology;  Laterality: N/A;  . OVARY SURGERY Right    cyst removed a while ago  . TONSILLECTOMY    . tubes in ear      Family History  Problem Relation Age of Onset  . Asthma Mother   . Diabetes Mother   . Hyperlipidemia Mother   . Hypertension Mother   . Diabetes Father   . Hyperlipidemia Father   . Hypertension Father   . Diabetes Brother   . Depression Brother   . Alcohol abuse Brother   . Depression Maternal Grandmother   . Stroke Paternal Grandmother   . Breast cancer Neg Hx     Allergies  Allergen Reactions  . Ibuprofen Swelling    Facial   .  Ciprofloxacin Hives and Rash  . Metformin And Related Other (See Comments)    Elevated Lactic Acid  . Penicillins Hives and Rash    Has patient had a PCN reaction causing immediate rash, facial/tongue/throat swelling, SOB or lightheadedness with hypotension: No Has patient had a PCN reaction causing severe rash involving mucus membranes or skin necrosis: No Has patient had a PCN reaction that required hospitalization: No Has patient had a PCN reaction occurring within the last 10 years: No If all of the above answers are "NO", then may proceed with Cephalosporin use. THE PATIENT IS ABLE TO TOLERATE CEPHALOSPORINS WITHOUT DIFFIC  . Sulfa Antibiotics Hives and Rash  . Other     ALL NUTS-SCRATCHY THROAT  . Shellfish Allergy Other (See Comments)    ALL SEAFOOD-SCRATCHY THROAT    Current Outpatient Medications on File Prior to Visit  Medication Sig Dispense Refill  . albuterol (VENTOLIN HFA) 108 (90 Base) MCG/ACT inhaler INHALE 2 PUFFS BY MOUTH EVERY 6 HOURS AS NEEDED FOR SHORTNESS OF BREATH AND WHEEZING (Patient taking  differently: Inhale 2 puffs into the lungs every 6 (six) hours as needed for wheezing or shortness of breath. INHALE 2 PUFFS BY MOUTH EVERY 6 HOURS AS NEEDED FOR SHORTNESS OF BREATH AND WHEEZING) 18 g 0  . blood glucose meter kit and supplies KIT Dispense based on patient and insurance preference. Use up three times daily as directed. (FOR ICD-9 250.00, 250.01). 1 each 0  . cyclobenzaprine (FLEXERIL) 10 MG tablet Take 1 tablet (10 mg total) by mouth at bedtime. 20 tablet 0  . insulin glargine (LANTUS) 100 UNIT/ML injection Inject 0.3 mLs (30 Units total) into the skin at bedtime. 10 mL 0  . Insulin Pen Needle (PEN NEEDLES) 31G X 6 MM MISC Use with insulin as directed. 100 each 2  . metoprolol tartrate (LOPRESSOR) 25 MG tablet Take 1 tablet (25 mg total) by mouth 2 (two) times daily. 60 tablet 0  . montelukast (SINGULAIR) 10 MG tablet Take 10 mg by mouth at bedtime.     No current facility-administered medications on file prior to visit.    BP 136/86   Pulse 91   Temp (!) 96 F (35.6 C) (Temporal)   Ht _0  (1.676 m)   Wt 297 lb 4 oz (134.8 kg)   LMP 07/24/2018 (Exact Date) Comment: surgery 07/31/2018  SpO2 98%   BMI 47.98 kg/m    Objective:   Physical Exam  Constitutional: She appears well-nourished.  Cardiovascular: Normal rate and regular rhythm.  Respiratory: Effort normal and breath sounds normal.  Musculoskeletal:     Cervical back: Neck supple.  Skin: Skin is warm and dry.  Psychiatric: She has a normal mood and affect.           Assessment & Plan:

## 2019-09-03 NOTE — Assessment & Plan Note (Signed)
Improved with metoprolol tartrate. Continue same.

## 2019-09-03 NOTE — Assessment & Plan Note (Signed)
Slight improvement in A1C to 11.7 but still uncontrolled. She is wanting information regarding an insulin pump and would like to see endocrinology.   Referral placed to endocrinology. Resumed ARB for renal protection and BP. Pneumonia vaccination UTD. Foot exam next visit. Lipid panel pending.  Continue Lantus 30 units in AM and 50 units in PM, patient put herself on this dose.  Start Novolog 8 units TID with meals, may need to titrate up. She will send readings in 2 weeks.  Follow up in 1 month if not in with endocrinology.

## 2019-09-03 NOTE — Patient Instructions (Signed)
We've increased the dose of your Trulicity to 1.5 mg weekly. I sent a new prescription to your pharmacy.  Continue injecting Lantus 50 units at night and 30 units in the morning.  Start Novolog and inject 8 units into the skin before meals for blood sugars above 100.  Continue checking your blood sugar levels.  Appropriate times to check your blood sugar levels are:  -Before any meal (breakfast, lunch, dinner) -Two hours after any meal (breakfast, lunch, dinner) -Bedtime  Record your readings and send me readings 2 weeks after you start your insulin.  Start losartan 25 mg once daily for blood pressure and kidney protection.  You will be contacted regarding your referral to endocrinology.  Please let us know if you have not been contacted within two weeks.   Send me blood pressure readings via My Chart in 2 weeks.  Please schedule a follow up appointment in 1 month for diabetes check.  It was a pleasure to see you today!   Diabetes Mellitus and Nutrition, Adult When you have diabetes (diabetes mellitus), it is very important to have healthy eating habits because your blood sugar (glucose) levels are greatly affected by what you eat and drink. Eating healthy foods in the appropriate amounts, at about the same times every day, can help you:  Control your blood glucose.  Lower your risk of heart disease.  Improve your blood pressure.  Reach or maintain a healthy weight. Every person with diabetes is different, and each person has different needs for a meal plan. Your health care provider may recommend that you work with a diet and nutrition specialist (dietitian) to make a meal plan that is best for you. Your meal plan may vary depending on factors such as:  The calories you need.  The medicines you take.  Your weight.  Your blood glucose, blood pressure, and cholesterol levels.  Your activity level.  Other health conditions you have, such as heart or kidney disease. How  do carbohydrates affect me? Carbohydrates, also called carbs, affect your blood glucose level more than any other type of food. Eating carbs naturally raises the amount of glucose in your blood. Carb counting is a method for keeping track of how many carbs you eat. Counting carbs is important to keep your blood glucose at a healthy level, especially if you use insulin or take certain oral diabetes medicines. It is important to know how many carbs you can safely have in each meal. This is different for every person. Your dietitian can help you calculate how many carbs you should have at each meal and for each snack. Foods that contain carbs include:  Bread, cereal, rice, pasta, and crackers.  Potatoes and corn.  Peas, beans, and lentils.  Milk and yogurt.  Fruit and juice.  Desserts, such as cakes, cookies, ice cream, and candy. How does alcohol affect me? Alcohol can cause a sudden decrease in blood glucose (hypoglycemia), especially if you use insulin or take certain oral diabetes medicines. Hypoglycemia can be a life-threatening condition. Symptoms of hypoglycemia (sleepiness, dizziness, and confusion) are similar to symptoms of having too much alcohol. If your health care provider says that alcohol is safe for you, follow these guidelines:  Limit alcohol intake to no more than 1 drink per day for nonpregnant women and 2 drinks per day for men. One drink equals 12 oz of beer, 5 oz of wine, or 1 oz of hard liquor.  Do not drink on an empty stomach.  Keep  yourself hydrated with water, diet soda, or unsweetened iced tea.  Keep in mind that regular soda, juice, and other mixers may contain a lot of sugar and must be counted as carbs. What are tips for following this plan?  Reading food labels  Start by checking the serving size on the "Nutrition Facts" label of packaged foods and drinks. The amount of calories, carbs, fats, and other nutrients listed on the label is based on one serving  of the item. Many items contain more than one serving per package.  Check the total grams (g) of carbs in one serving. You can calculate the number of servings of carbs in one serving by dividing the total carbs by 15. For example, if a food has 30 g of total carbs, it would be equal to 2 servings of carbs.  Check the number of grams (g) of saturated and trans fats in one serving. Choose foods that have low or no amount of these fats.  Check the number of milligrams (mg) of salt (sodium) in one serving. Most people should limit total sodium intake to less than 2,300 mg per day.  Always check the nutrition information of foods labeled as "low-fat" or "nonfat". These foods may be higher in added sugar or refined carbs and should be avoided.  Talk to your dietitian to identify your daily goals for nutrients listed on the label. Shopping  Avoid buying canned, premade, or processed foods. These foods tend to be high in fat, sodium, and added sugar.  Shop around the outside edge of the grocery store. This includes fresh fruits and vegetables, bulk grains, fresh meats, and fresh dairy. Cooking  Use low-heat cooking methods, such as baking, instead of high-heat cooking methods like deep frying.  Cook using healthy oils, such as olive, canola, or sunflower oil.  Avoid cooking with butter, cream, or high-fat meats. Meal planning  Eat meals and snacks regularly, preferably at the same times every day. Avoid going long periods of time without eating.  Eat foods high in fiber, such as fresh fruits, vegetables, beans, and whole grains. Talk to your dietitian about how many servings of carbs you can eat at each meal.  Eat 4-6 ounces (oz) of lean protein each day, such as lean meat, chicken, fish, eggs, or tofu. One oz of lean protein is equal to: ? 1 oz of meat, chicken, or fish. ? 1 egg. ?  cup of tofu.  Eat some foods each day that contain healthy fats, such as avocado, nuts, seeds, and  fish. Lifestyle  Check your blood glucose regularly.  Exercise regularly as told by your health care provider. This may include: ? 150 minutes of moderate-intensity or vigorous-intensity exercise each week. This could be brisk walking, biking, or water aerobics. ? Stretching and doing strength exercises, such as yoga or weightlifting, at least 2 times a week.  Take medicines as told by your health care provider.  Do not use any products that contain nicotine or tobacco, such as cigarettes and e-cigarettes. If you need help quitting, ask your health care provider.  Work with a Social worker or diabetes educator to identify strategies to manage stress and any emotional and social challenges. Questions to ask a health care provider  Do I need to meet with a diabetes educator?  Do I need to meet with a dietitian?  What number can I call if I have questions?  When are the best times to check my blood glucose? Where to find more information:  American Diabetes Association: diabetes.org  Academy of Nutrition and Dietetics: www.eatright.[employer] of Diabetes and Digestive and Kidney Diseases (NIH): CarFlippers.tn Summary  A healthy meal plan will help you control your blood glucose and maintain a healthy lifestyle.  Working with a diet and nutrition specialist (dietitian) can help you make a meal plan that is best for you.  Keep in mind that carbohydrates (carbs) and alcohol have immediate effects on your blood glucose levels. It is important to count carbs and to use alcohol carefully. This information is not intended to replace advice given to you by your health care provider. Make sure you discuss any questions you have with your health care provider. Document Revised: 07/04/2017 Document Reviewed: 08/26/2016 Elsevier Patient Education  2020 [employer].

## 2019-09-03 NOTE — Assessment & Plan Note (Signed)
Repeat lipids pending.  

## 2019-09-03 NOTE — Assessment & Plan Note (Signed)
Overall doing well off all meds except for sleeping. Will have her trial her home hydroxyzine and update. Consider another trial of Trazdone. She will update.

## 2019-09-03 NOTE — Assessment & Plan Note (Signed)
Borderline high off losartan 100 mg and only on metoprolol tartrate. Will resume losartan at 25 mg to start, she will send BP readings in 2 weeks.

## 2019-09-04 LAB — LIPID PANEL
Cholesterol: 276 mg/dL — ABNORMAL HIGH (ref <200)
HDL: 53 mg/dL (ref >=50)
Non-HDL Cholesterol (Calc): 223 mg/dL (calc) — ABNORMAL HIGH (ref <130)
Total CHOL/HDL Ratio: 5.2 (calc) — ABNORMAL HIGH (ref <5.0)
Triglycerides: 606 mg/dL — ABNORMAL HIGH (ref <150)

## 2019-09-04 LAB — BASIC METABOLIC PANEL
BUN: 12 mg/dL (ref 7–25)
CO2: 28 mmol/L (ref 20–32)
Calcium: 9.4 mg/dL (ref 8.6–10.2)
Chloride: 99 mmol/L (ref 98–110)
Creat: 0.68 mg/dL (ref 0.50–1.10)
Glucose, Bld: 247 mg/dL — ABNORMAL HIGH (ref 65–99)
Potassium: 4.7 mmol/L (ref 3.5–5.3)
Sodium: 135 mmol/L (ref 135–146)

## 2019-09-05 DIAGNOSIS — E785 Hyperlipidemia, unspecified: Secondary | ICD-10-CM

## 2019-09-06 MED ORDER — ROSUVASTATIN CALCIUM 10 MG PO TABS: 10.0000 mg | ORAL_TABLET | Freq: Every evening | ORAL | 3 refills | Status: DC

## 2019-09-08 ENCOUNTER — Ambulatory Visit (INDEPENDENT_AMBULATORY_CARE_PROVIDER_SITE_OTHER): Payer: 59

## 2019-09-08 ENCOUNTER — Other Ambulatory Visit: Payer: Self-pay

## 2019-09-08 ENCOUNTER — Telehealth: Payer: Self-pay | Admitting: *Deleted

## 2019-09-08 ENCOUNTER — Ambulatory Visit
Admission: EM | Admit: 2019-09-08 | Discharge: 2019-09-08 | Disposition: A | Discharge status: Home / Self Care | Payer: 59 | Attending: Family Medicine | Admitting: Family Medicine

## 2019-09-08 DIAGNOSIS — R0789 Other chest pain: Secondary | ICD-10-CM

## 2019-09-08 HISTORY — DX: Hyperlipidemia, unspecified: E78.5

## 2019-09-08 LAB — CBC WITH DIFFERENTIAL/PLATELET
Abs Immature Granulocytes: 0.03 10*3/uL (ref 0.00–0.07)
Basophils Absolute: 0 10*3/uL (ref 0.0–0.1)
Basophils Relative: 1 %
Eosinophils Absolute: 0.1 10*3/uL (ref 0.0–0.5)
Eosinophils Relative: 2 %
HCT: 42.9 % (ref 36.0–46.0)
Hemoglobin: 14.4 g/dL (ref 12.0–15.0)
Immature Granulocytes: 1 %
Lymphocytes Relative: 28 %
Lymphs Abs: 1.9 10*3/uL (ref 0.7–4.0)
MCH: 27.5 pg (ref 26.0–34.0)
MCHC: 33.6 g/dL (ref 30.0–36.0)
MCV: 82 fL (ref 80.0–100.0)
Monocytes Absolute: 0.4 10*3/uL (ref 0.1–1.0)
Monocytes Relative: 6 %
Neutro Abs: 4.2 10*3/uL (ref 1.7–7.7)
Neutrophils Relative %: 62 %
Platelets: 330 10*3/uL (ref 150–400)
RBC: 5.23 MIL/uL — ABNORMAL HIGH (ref 3.87–5.11)
RDW: 13.2 % (ref 11.5–15.5)
WBC: 6.6 10*3/uL (ref 4.0–10.5)
nRBC: 0 % (ref 0.0–0.2)

## 2019-09-08 LAB — COMPREHENSIVE METABOLIC PANEL
ALT: 40 U/L (ref 0–44)
AST: 24 U/L (ref 15–41)
Albumin: 4.1 g/dL (ref 3.5–5.0)
Alkaline Phosphatase: 130 U/L — ABNORMAL HIGH (ref 38–126)
Anion gap: 11 (ref 5–15)
BUN: 20 mg/dL (ref 6–20)
CO2: 24 mmol/L (ref 22–32)
Calcium: 9.2 mg/dL (ref 8.9–10.3)
Chloride: 96 mmol/L — ABNORMAL LOW (ref 98–111)
Creatinine, Ser: 0.71 mg/dL (ref 0.44–1.00)
GFR calc Af Amer: >60 mL/min (ref >60)
GFR calc non Af Amer: >60 mL/min (ref >60)
Glucose, Bld: 246 mg/dL — ABNORMAL HIGH (ref 70–99)
Potassium: 4 mmol/L (ref 3.5–5.1)
Sodium: 131 mmol/L — ABNORMAL LOW (ref 135–145)
Total Bilirubin: 0.9 mg/dL (ref 0.3–1.2)
Total Protein: 7.8 g/dL (ref 6.5–8.1)

## 2019-09-08 LAB — TROPONIN I (HIGH SENSITIVITY): Troponin I (High Sensitivity): <2 ng/L (ref <18)

## 2019-09-08 LAB — LIPASE, BLOOD: Lipase: 26 U/L (ref 11–51)

## 2019-09-08 MED ORDER — PANTOPRAZOLE SODIUM 40 MG PO TBEC: 40.0000 mg | DELAYED_RELEASE_TABLET | Freq: Every day | ORAL | 0 refills | Status: DC

## 2019-09-08 MED ORDER — CITALOPRAM HYDROBROMIDE 20 MG PO TABS: 20.0000 mg | ORAL_TABLET | Freq: Every day | ORAL | 0 refills | Status: DC

## 2019-09-08 NOTE — Telephone Encounter (Signed)
Noted patient has update status through MyChart to [employer]

## 2019-09-08 NOTE — ED Triage Notes (Addendum)
Pt presents with c/o chest pain x2 days. She also reports abdominal pain after eating dinner last night, she has hx of cholecystectomy. She mid to left sided chest pain that comes and goes, but is occasionally constant. She also report shob and hx of asthma. She reports recent history of COVID (1/21), tested at [facility]. Pt also reports recent dx of pna and flu, treated at [facility].

## 2019-09-08 NOTE — ED Provider Notes (Signed)
[facility]    CSN: [id number] Arrival date & time: 09/08/19  1019      History   Chief Complaint Chief Complaint  Patient presents with  . Chest Pain      HPI  39 year old female with morbid obesity, uncontrolled type 2 diabetes, hypertension, hyperlipidemia presents with chest pain.  Patient reports a 2-day history of chest pain.  Located on the left side of the chest.  Patient reports that her chest pain is constant.  Described as a throbbing pain.  Rates her pain is 8-9/10 in severity.  No known exacerbating or relieving factors.  She has had some associated nausea.  She also reports some shortness of breath.  She is unsure if this is due to recent COVID-19 and asthma or whether it is secondary to chest pain.  Patient reports that she has had some diaphoresis.  There is no association with exertion.  Patient reports that she has recently had some upper abdominal pain after eating which occurred last night.  Patient also reports some intermittent cough.  She states that she has had cough intermittently since she has had COVID-19.  Past Medical History:  Diagnosis Date  . Asthma   . Auditory hallucinations    only after anesthesia  . Diabetes mellitus, type II (HCC)    insulin, jardiance  . Dyspnea    with exertion  . Essential hypertension   . Essential hypertension 11/24/2017  . GERD (gastroesophageal reflux disease)    occasionally-NO MEDS  . History of placement of ear tubes 05/20/2018  . Hyperlipidemia   . MDD (major depressive disorder)   . Miscarriage   . PTSD (post-traumatic stress disorder)   . Tachycardia     Patient Active Problem List   Diagnosis Date Noted  . COVID-19 virus infection 08/16/2019  . Kidney stone 02/11/2019  . Asthma 11/03/2018  . Acute non-recurrent maxillary sinusitis 09/25/2018  . Recurrent sinusitis 05/20/2018  . Tachycardia 05/04/2018  . Missed period 04/29/2018  . Acute asthma exacerbation 04/10/2018  . BV (bacterial  vaginosis) 02/13/2018  . Diabetes mellitus type 2, uncontrolled ([place]) 11/24/2017  . Essential hypertension 11/24/2017  . Hyperlipidemia 11/24/2017  . Major depressive disorder, recurrent, severe without psychotic features ([place])   . PTSD (post-traumatic stress disorder) 04/11/2015    Past Surgical History:  Procedure Laterality Date  . ABDOMINAL HYSTERECTOMY    . ADENOIDECTOMY    . CHOLECYSTECTOMY  2009  . CYSTOSCOPY N/A 07/31/2018   Procedure: CYSTOSCOPY;  Surgeon: Benjaman Kindler, MD;  Location: [facility] ORS;  Service: Gynecology;  Laterality: N/A;  . LAPAROSCOPIC BILATERAL SALPINGECTOMY Bilateral 07/31/2018   Procedure: LAPAROSCOPIC BILATERAL SALPINGECTOMY;  Surgeon: Benjaman Kindler, MD;  Location: [facility] ORS;  Service: Gynecology;  Laterality: Bilateral;  . LAPAROSCOPIC HYSTERECTOMY N/A 07/31/2018   Procedure: HYSTERECTOMY TOTAL LAPAROSCOPIC;  Surgeon: Benjaman Kindler, MD;  Location: [facility] ORS;  Service: Gynecology;  Laterality: N/A;  . LAPAROSCOPY N/A 06/07/2019   Procedure: LAPAROSCOPY OPERATIVE, WITH PERITONEAL BIOPSIES;  Surgeon: Benjaman Kindler, MD;  Location: [facility] ORS;  Service: Gynecology;  Laterality: N/A;  . LYSIS OF ADHESION N/A 07/31/2018   Procedure: LYSIS OF ADHESION;  Surgeon: Benjaman Kindler, MD;  Location: [facility] ORS;  Service: Gynecology;  Laterality: N/A;  . OVARY SURGERY Right    cyst removed a while ago  . TONSILLECTOMY    . tubes in ear      OB History   No obstetric history on file.      Home Medications    Prior  to Admission medications   Medication Sig Start Date End Date Taking? Authorizing Provider  albuterol (VENTOLIN HFA) 108 (90 Base) MCG/ACT inhaler INHALE 2 PUFFS BY MOUTH EVERY 6 HOURS AS NEEDED FOR SHORTNESS OF BREATH AND WHEEZING Patient taking differently: Inhale 2 puffs into the lungs every 6 (six) hours as needed for wheezing or shortness of breath. INHALE 2 PUFFS BY MOUTH EVERY 6 HOURS AS NEEDED FOR SHORTNESS OF BREATH AND WHEEZING 10/19/18   Yes Pleas Koch, NP  blood glucose meter kit and supplies KIT Dispense based on patient and insurance preference. Use up three times daily as directed. (FOR ICD-9 250.00, 250.01). 11/24/17  Yes Pleas Koch, NP  Continuous Blood Gluc Receiver (FREESTYLE LIBRE 14 DAY READER) DEVI 1 Device by Does not apply route 3 (three) times daily as needed. 09/03/19  Yes Pleas Koch, NP  Continuous Blood Gluc Sensor (FREESTYLE LIBRE 14 DAY SENSOR) MISC 1 Device by Does not apply route 3 (three) times daily as needed. 09/03/19  Yes Pleas Koch, NP  Dulaglutide (TRULICITY) 1.5 NO/7.0JG SOPN Inject 1.5 mg into the skin once a week. 09/03/19  Yes Pleas Koch, NP  insulin aspart (NOVOLOG FLEXPEN) 100 UNIT/ML FlexPen Inject 8 Units into the skin 3 (three) times daily with meals. 09/03/19  Yes Pleas Koch, NP  insulin glargine (LANTUS) 100 UNIT/ML injection Inject 0.3 mLs (30 Units total) into the skin at bedtime. 08/05/19  Yes Pleas Koch, NP  Insulin Pen Needle (PEN NEEDLES) 31G X 6 MM MISC Use with insulin as directed. 11/04/18  Yes Pleas Koch, NP  losartan (COZAAR) 25 MG tablet Take 1 tablet (25 mg total) by mouth daily. For blood pressure. 09/03/19  Yes Pleas Koch, NP  metoprolol tartrate (LOPRESSOR) 25 MG tablet Take 1 tablet (25 mg total) by mouth 2 (two) times daily. 08/30/19  Yes Tysinger, Camelia Eng, PA-C  montelukast (SINGULAIR) 10 MG tablet Take 10 mg by mouth at bedtime.   Yes [provider]  rosuvastatin (CRESTOR) 10 MG tablet Take 1 tablet (10 mg total) by mouth every evening. For cholesterol. 09/06/19  Yes Pleas Koch, NP  pantoprazole (PROTONIX) 40 MG tablet Take 1 tablet (40 mg total) by mouth daily. 09/08/19   Coral Spikes, DO    Family History Family History  Problem Relation Age of Onset  . Asthma Mother   . Diabetes Mother   . Hyperlipidemia Mother   . Hypertension Mother   . Diabetes Father   . Hyperlipidemia Father   .  Hypertension Father   . Diabetes Brother   . Depression Brother   . Alcohol abuse Brother   . Depression Maternal Grandmother   . Stroke Paternal Grandmother   . Breast cancer Neg Hx     Social History Social History   Tobacco Use  . Smoking status: Never Smoker  . Smokeless tobacco: Never Used  Substance Use Topics  . Alcohol use: No  . Drug use: No     Allergies   Ibuprofen, Ciprofloxacin, Metformin and related, Penicillins, Sulfa antibiotics, Other, and Shellfish allergy   Review of Systems Review of Systems  Constitutional: Negative for fever.  Respiratory: Positive for cough and shortness of breath.   Cardiovascular: Positive for chest pain.  Gastrointestinal: Positive for abdominal pain and nausea.   Physical Exam Triage Vital Signs ED Triage Vitals  Enc Vitals Group     BP 09/08/19 1037 130/83     Pulse Rate 09/08/19 1037  81     Resp --      Temp 09/08/19 1037 98.4 F (36.9 C)     Temp Source 09/08/19 1037 Oral     SpO2 09/08/19 1037 98 %     Weight 09/08/19 1034 297 lb (134.7 kg)     Height 09/08/19 1034 '5\' 6"'  (1.676 m)     Head Circumference --      Peak Flow --      Pain Score 09/08/19 1034 9     Pain Loc --      Pain Edu? --      Excl. in [place]? --     Updated Vital Signs BP 130/83 (BP Location: Left Arm)   Pulse 81   Temp 98.4 F (36.9 C) (Oral)   Ht '5\' 6"'  (1.676 m)   Wt 134.7 kg   LMP 07/24/2018 (Exact Date) Comment: surgery 07/31/2018  SpO2 98%   BMI 47.94 kg/m   Visual Acuity Right Eye Distance:   Left Eye Distance:   Bilateral Distance:    Right Eye Near:   Left Eye Near:    Bilateral Near:     Physical Exam Vitals and nursing note reviewed.  Constitutional:      General: She is not in acute distress.    Appearance: She is well-developed. She is obese. She is not ill-appearing.  HENT:     Head: Normocephalic and atraumatic.  Eyes:     General:        Right eye: No discharge.        Left eye: No discharge.      Conjunctiva/sclera: Conjunctivae normal.  Cardiovascular:     Rate and Rhythm: Normal rate and regular rhythm.     Heart sounds: Normal heart sounds. No murmur.  Pulmonary:     Effort: Pulmonary effort is normal.     Breath sounds: Normal breath sounds. No wheezing, rhonchi or rales.  Abdominal:     General: There is no distension.     Palpations: Abdomen is soft.     Tenderness: There is no abdominal tenderness.  Neurological:     Mental Status: She is alert.  Psychiatric:        Mood and Affect: Mood normal.        Behavior: Behavior normal.    UC Treatments / Results  Labs (all labs ordered are listed, but only abnormal results are displayed) Labs Reviewed  CBC WITH DIFFERENTIAL/PLATELET - Abnormal; Notable for the following components:      Result Value   RBC 5.23 (*)    All other components within normal limits  COMPREHENSIVE METABOLIC PANEL - Abnormal; Notable for the following components:   Sodium 131 (*)    Chloride 96 (*)    Glucose, Bld 246 (*)    Alkaline Phosphatase 130 (*)    All other components within normal limits  LIPASE, BLOOD  TROPONIN I (HIGH SENSITIVITY)    EKG Normal sinus rhythm with a rate of 89.  Normal axis.  Normal intervals.  No ST or T wave changes.  Radiology DG Chest 2 View  Result Date: 09/08/2019 CLINICAL DATA:  Chest pain.  COVID-19 positive in January 2021. EXAM: CHEST - 2 VIEW COMPARISON:  08/21/2019 FINDINGS: The heart size and mediastinal contours are within normal limits. Both lungs are clear. The visualized skeletal structures are unremarkable. IMPRESSION: Normal exam. Electronically Signed   By: Lorriane Shire M.D.   On: 09/08/2019 11:17    Procedures Procedures (including critical  care time)  Medications Ordered in UC Medications - No data to display  Initial Impression / Assessment and Plan / UC Course  I have reviewed the triage vital signs and the nursing notes.  Pertinent labs & imaging results that were available  during my care of the patient were reviewed by me and considered in my medical decision making (see chart for details).    39 year old female presents with chest pain.  Presents with atypical chest pain.  Given risk factors, laboratory studies and EKG obtained.  EKG normal.  Negative troponin.  CBC unremarkable.  Metabolic panel noted for elevated glucose and mildly elevated alkaline phosphatase.  Otherwise unremarkable.  Etiology for her chest pain is unclear at this time.  She has had some recent abdominal pain following heavy meal.  Possible component of reflux.  Placing on Protonix.  Advised to go to the hospital if she fails to improve or worsens.  Final Clinical Impressions(s) / UC Diagnoses   Final diagnoses:  Atypical chest pain     Discharge Instructions     All your work up was negative.   Try the protonix to see if it will help.  If persists or worsens, go to the hospital.  Take care  Dr. Lacinda Axon     ED Prescriptions    Medication Sig [place]. Provider   pantoprazole (PROTONIX) 40 MG tablet Take 1 tablet (40 mg total) by mouth daily. 30 tablet Coral Spikes, DO     PDMP not reviewed this encounter.   Coral Spikes, [place] 09/08/19 1159

## 2019-09-08 NOTE — Discharge Instructions (Signed)
All your work up was negative.   Try the protonix to see if it will help.  If persists or worsens, go to the hospital.  Take care  Dr. Adriana Simas

## 2019-09-08 NOTE — Telephone Encounter (Signed)
Patient evaluated at urgent care, underwent testing including ECG, labs, etc. Placed on pantoprazole.  Johny Drilling, will you check on patient tomorrow 02/04?

## 2019-09-08 NOTE — Telephone Encounter (Signed)
Patient called to schedule an appointment and was transferred to triage because she is having chest pain. Patient stated that she started yesterday morning at 12:00 am with chest pain. Patient stated that she is having some SOB, but has a history of asthma. Patient stated that she was diagnosed with covid on 08/07/19 and developed pneumonia from covid. Patient stated that she does have some nausea and had sharp pains in her stomach last night after eating.. Patient stated that she does not have any diarrhea. Patient stated that her temperature this morning has been 99.0 and 100.0. 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 Patient stated that she had the flu last week. After speaking to Mayra Reel NP patient was advised that she needs to go to an Urgent Care for evaluation. Patient stated that she works in [place] and will plan on going to the Urgent Care in [place].

## 2019-09-14 LAB — HM DIABETES EYE EXAM

## 2019-09-16 ENCOUNTER — Telehealth: Payer: Self-pay | Admitting: Primary Care

## 2019-09-16 ENCOUNTER — Encounter: Payer: Self-pay | Admitting: Primary Care

## 2019-09-16 NOTE — Telephone Encounter (Signed)
Received prior authorization request for Lantus and request have been sent on 09/13/2019.  Received notice of determination. The insurance have denied prior authorization because formulary.  Requirement: Trial and failure of 3 or more with at least 3 alternatives, 2 in a class with 2 alteratives, or 1 in a class with only 1 alternative.  The noticed have been place in Kate's inbox for review.

## 2019-09-17 ENCOUNTER — Other Ambulatory Visit: Payer: Self-pay

## 2019-09-17 ENCOUNTER — Ambulatory Visit: Payer: 59 | Admitting: Primary Care

## 2019-09-17 ENCOUNTER — Ambulatory Visit
Admission: EM | Admit: 2019-09-17 | Discharge: 2019-09-17 | Disposition: A | Discharge status: Home / Self Care | Payer: 59 | Attending: Emergency Medicine | Admitting: Emergency Medicine

## 2019-09-17 ENCOUNTER — Encounter: Payer: Self-pay | Admitting: Emergency Medicine

## 2019-09-17 DIAGNOSIS — H6693 Otitis media, unspecified, bilateral: Secondary | ICD-10-CM

## 2019-09-17 DIAGNOSIS — R590 Localized enlarged lymph nodes: Secondary | ICD-10-CM | POA: Diagnosis not present

## 2019-09-17 DIAGNOSIS — Z20822 Contact with and (suspected) exposure to covid-19: Secondary | ICD-10-CM | POA: Insufficient documentation

## 2019-09-17 DIAGNOSIS — H6690 Otitis media, unspecified, unspecified ear: Secondary | ICD-10-CM | POA: Diagnosis present

## 2019-09-17 DIAGNOSIS — R591 Generalized enlarged lymph nodes: Secondary | ICD-10-CM | POA: Insufficient documentation

## 2019-09-17 DIAGNOSIS — E1165 Type 2 diabetes mellitus with hyperglycemia: Secondary | ICD-10-CM

## 2019-09-17 DIAGNOSIS — J069 Acute upper respiratory infection, unspecified: Secondary | ICD-10-CM | POA: Insufficient documentation

## 2019-09-17 LAB — INFLUENZA PANEL BY PCR (TYPE A & B)
Influenza A By PCR: NEGATIVE
Influenza B By PCR: NEGATIVE

## 2019-09-17 LAB — GROUP A STREP BY PCR: Group A Strep by PCR: NOT DETECTED

## 2019-09-17 MED ORDER — CEFDINIR 300 MG PO CAPS: 300.0000 mg | ORAL_CAPSULE | Freq: Two times a day (BID) | ORAL | 0 refills | Status: AC

## 2019-09-17 MED ORDER — BASAGLAR KWIKPEN 100 UNIT/ML ~~LOC~~ SOPN: PEN_INJECTOR | SUBCUTANEOUS | 5 refills | Status: DC

## 2019-09-17 NOTE — ED Triage Notes (Signed)
Patient c/o sore throat, cough, congestion, and fever that started this morning.  Patient has taken some Tylenol around 8am this morning.

## 2019-09-17 NOTE — Telephone Encounter (Signed)
Reviewed and switched her to [employer].  Patient is aware.

## 2019-09-17 NOTE — Discharge Instructions (Addendum)
It was very nice seeing you today in clinic. Thank you for entrusting me with your care.   Rest and Stay HYDRATED. Water and electrolyte containing beverages (Gatorade, Pedialyte) are best to prevent dehydration and electrolyte abnormalities.  Please utilize the medications that we discussed. Your prescriptions has been called in to your pharmacy.   You were tested for SARS-CoV-2 (novel coronavirus) today. Testing is performed by an outside lab ([facility]) and has variable turn around times ranging between 24-48 hours. Current recommendations from the the CDC and [place] DHHS require that you remain out of work in order to quarantine at home until negative test results are have been received. In the event that your test results are positive, you will be contacted with further directives. These measures are being implemented out of an abundance of caution to prevent transmission and spread during the current SARS-CoV-2 pandemic.  Make arrangements to follow up with your regular doctor in 1 week for re-evaluation if not improving. If your symptoms/condition worsens, please seek follow up care either here or in the ER. Please remember, our [facility] providers are "right here with you" when you need [place].   Again, it was my pleasure to take care of you today. Thank you for choosing our clinic. I hope that you start to feel better quickly.   Quentin Mulling, MSN, APRN, FNP-C, CEN Advanced Practice Provider [facility]

## 2019-09-18 LAB — NOVEL CORONAVIRUS, NAA (HOSP ORDER, SEND-OUT TO REF LAB; TAT 18-24 HRS): SARS-CoV-2, NAA: NOT DETECTED

## 2019-09-18 NOTE — ED Provider Notes (Signed)
[place], [place]   Name: Joan [name] DOB: 07/25/1981 MRN: [id number] CSN: [id number] PCP: Doreene Nest, NP  Arrival date and time:  09/17/19 1115  Chief Complaint:  Cough, Sore Throat, and Fever   NOTE: Prior to seeing the patient today, I have reviewed the triage nursing documentation and vital signs. Clinical staff has updated patient's PMH/PSHx, current medication list, and drug allergies/intolerances to ensure comprehensive history available to assist in medical decision making.   History:   HPI: ZILAH VILLAFLOR is a 39 y.o. female who presents today with complaints of fatigue, cough, sore throat, BILATERAL ear pain, and a generalized headache that began with acute onset at 0230 this morning. Patient has had fevers as high as 101. Cough is productive non-productive with no associated shortness of breath or wheezing. She denies that she has experienced any nausea, vomiting, diarrhea, or abdominal pain. She is eating and drinking well. Patient denies any perceived alterations to her sense of taste or smell. Patient presents out of concerns for her personal health after being exposed to someone who tested positive for SARS-CoV-2 (novel coronavirus). Of note, patient was personally diagnosed with SARS-CoV-2 on 08/07/2019. No one else is her home has experienced a similar symptom constellation. Patient has been vaccinated for influenza this season. Despite her symptoms, patient has not taken any over the counter interventions to help improve/relieve her reported symptoms at home.   Past Medical History:  Diagnosis Date  . Asthma   . Auditory hallucinations    only after anesthesia  . Diabetes mellitus without complication (HCC)    diet controlled  . Diabetes mellitus, type II (HCC)    insulin, jardiance  . Dyspnea    with exertion  . Essential hypertension   . Essential hypertension 11/24/2017  . GERD (gastroesophageal reflux disease)    occasionally-NO MEDS  . History of  placement of ear tubes 05/20/2018  . Hyperlipidemia   . MDD (major depressive disorder)   . Miscarriage   . PTSD (post-traumatic stress disorder)   . Tachycardia     Past Surgical History:  Procedure Laterality Date  . ABDOMINAL HYSTERECTOMY    . ADENOIDECTOMY    . CHOLECYSTECTOMY  2009  . CYSTOSCOPY N/A 07/31/2018   Procedure: CYSTOSCOPY;  Surgeon: Christeen Douglas, MD;  Location: [facility];  Service: Gynecology;  Laterality: N/A;  . LAPAROSCOPIC BILATERAL SALPINGECTOMY Bilateral 07/31/2018   Procedure: LAPAROSCOPIC BILATERAL SALPINGECTOMY;  Surgeon: Christeen Douglas, MD;  Location: [facility];  Service: Gynecology;  Laterality: Bilateral;  . LAPAROSCOPIC HYSTERECTOMY N/A 07/31/2018   Procedure: HYSTERECTOMY TOTAL LAPAROSCOPIC;  Surgeon: Christeen Douglas, MD;  Location: [facility];  Service: Gynecology;  Laterality: N/A;  . LAPAROSCOPY N/A 06/07/2019   Procedure: LAPAROSCOPY OPERATIVE, WITH PERITONEAL BIOPSIES;  Surgeon: Christeen Douglas, MD;  Location: [facility];  Service: Gynecology;  Laterality: N/A;  . LYSIS OF ADHESION N/A 07/31/2018   Procedure: LYSIS OF ADHESION;  Surgeon: Christeen Douglas, MD;  Location: [facility];  Service: Gynecology;  Laterality: N/A;  . OVARY SURGERY Right    cyst removed a while ago  . TONSILLECTOMY    . tubes in ear      Family History  Problem Relation Age of Onset  . Asthma Mother   . Diabetes Mother   . Hyperlipidemia Mother   . Hypertension Mother   . Diabetes Father   . Hyperlipidemia Father   . Hypertension Father   . Diabetes Brother   . Depression Brother   . Alcohol abuse Brother   .  Depression Maternal Grandmother   . Stroke Paternal Grandmother   . Breast cancer Neg Hx     Social History   Tobacco Use  . Smoking status: Never Smoker  . Smokeless tobacco: Never Used  Substance Use Topics  . Alcohol use: No  . Drug use: No    Patient Active Problem List   Diagnosis Date Noted  . COVID-19 virus infection 08/16/2019  . Kidney  stone 02/11/2019  . Asthma 11/03/2018  . Acute non-recurrent maxillary sinusitis 09/25/2018  . Recurrent sinusitis 05/20/2018  . Tachycardia 05/04/2018  . Missed period 04/29/2018  . Acute asthma exacerbation 04/10/2018  . BV (bacterial vaginosis) 02/13/2018  . Diabetes mellitus type 2, uncontrolled ([place]) 11/24/2017  . Essential hypertension 11/24/2017  . Hyperlipidemia 11/24/2017  . Major depressive disorder, recurrent, severe without psychotic features ([place])   . PTSD (post-traumatic stress disorder) 04/11/2015    Home Medications:    Current Meds  Medication Sig  . albuterol (VENTOLIN HFA) 108 (90 Base) MCG/ACT inhaler INHALE 2 PUFFS BY MOUTH EVERY 6 HOURS AS NEEDED FOR SHORTNESS OF BREATH AND WHEEZING (Patient taking differently: Inhale 2 puffs into the lungs every 6 (six) hours as needed for wheezing or shortness of breath. INHALE 2 PUFFS BY MOUTH EVERY 6 HOURS AS NEEDED FOR SHORTNESS OF BREATH AND WHEEZING)  . citalopram (CELEXA) 20 MG tablet Take 1 tablet (20 mg total) by mouth daily. For anxiety.  . Dulaglutide (TRULICITY) 1.5 RJ/1.8AC SOPN Inject 1.5 mg into the skin once a week.  . insulin aspart (NOVOLOG FLEXPEN) 100 UNIT/ML FlexPen Inject 8 Units into the skin 3 (three) times daily with meals.  . Insulin Glargine (BASAGLAR KWIKPEN) 100 UNIT/ML SOPN Inject 30 units in the morning and 50 units in the evening for diabetes.  Marland Kitchen losartan (COZAAR) 25 MG tablet Take 1 tablet (25 mg total) by mouth daily. For blood pressure.  . metoprolol tartrate (LOPRESSOR) 25 MG tablet Take 1 tablet (25 mg total) by mouth 2 (two) times daily.  . montelukast (SINGULAIR) 10 MG tablet Take 10 mg by mouth at bedtime.  . pantoprazole (PROTONIX) 40 MG tablet Take 1 tablet (40 mg total) by mouth daily.  . rosuvastatin (CRESTOR) 10 MG tablet Take 1 tablet (10 mg total) by mouth every evening. For cholesterol.    Allergies:   Ibuprofen, Ciprofloxacin, Metformin and related, Penicillins, Sulfa antibiotics,  Other, and Shellfish allergy  Review of Systems (ROS): Review of Systems  Constitutional: Positive for fatigue and fever.  HENT: Positive for ear pain and sore throat. Negative for congestion, postnasal drip, rhinorrhea, sinus pressure, sinus pain and sneezing.   Eyes: Negative for pain, discharge and redness.  Respiratory: Positive for cough. Negative for chest tightness and shortness of breath.   Cardiovascular: Negative for chest pain and palpitations.  Gastrointestinal: Negative for abdominal pain, diarrhea, nausea and vomiting.  Musculoskeletal: Negative for arthralgias, back pain, myalgias and neck pain.  Skin: Negative for color change, pallor and rash.  Neurological: Negative for dizziness, syncope, weakness and headaches.  Hematological: Negative for adenopathy.     Vital Signs: Today's Vitals   09/17/19 1128 09/17/19 1131 09/17/19 1300  BP:  (!) 138/93   Pulse:  71   Resp:  14   Temp:  98.5 F (36.9 C)   TempSrc:  Oral   SpO2:  98%   Weight: 289 lb (131.1 kg)    Height: 5\' 6"  (1.676 m)    PainSc: 4   0-No pain    Physical Exam:  Physical Exam  Constitutional: She is oriented to person, place, and time and well-developed, well-nourished, and in no distress.  HENT:  Head: Normocephalic and atraumatic.  Right Ear: There is tenderness. Tympanic membrane is erythematous. A middle ear effusion (suppurative) is present.  Left Ear: There is tenderness. Tympanic membrane is erythematous and bulging. A middle ear effusion (suppurative) is present.  Nose: Nose normal.  Mouth/Throat: Uvula is midline and mucous membranes are normal. Posterior oropharyngeal erythema present. No oropharyngeal exudate.  Eyes: Pupils are equal, round, and reactive to light.  Cardiovascular: Normal rate, regular rhythm, normal heart sounds and intact distal pulses.  Pulmonary/Chest: Effort normal and breath sounds normal.  Lymphadenopathy:       Head (left side): Submandibular and tonsillar  adenopathy present.  Neurological: She is alert and oriented to person, place, and time. Gait normal.  Skin: Skin is warm and dry. No rash noted. She is not diaphoretic.  Psychiatric: Mood, memory, affect and judgment normal.  Nursing note and vitals reviewed.   Urgent Care Treatments / Results:   Orders Placed This Encounter  Procedures  . Group A Strep by PCR  . Novel Coronavirus, NAA (Hosp order, Send-out to Ref Lab; TAT 18-24 hrs  . Influenza panel by PCR (type A & B)    LABS: PLEASE NOTE: all labs that were ordered this encounter are listed, however only abnormal results are displayed. Labs Reviewed  GROUP A STREP BY PCR  NOVEL CORONAVIRUS, NAA (HOSP ORDER, SEND-OUT TO REF LAB; TAT 18-24 HRS)  INFLUENZA PANEL BY PCR (TYPE A & B)    EKG: -None  RADIOLOGY: No results found.  PROCEDURES: Procedures  MEDICATIONS RECEIVED THIS VISIT: Medications - No data to display  PERTINENT CLINICAL COURSE NOTES/UPDATES:   Initial Impression / Assessment and Plan / Urgent Care Course:  Pertinent labs & imaging results that were available during my care of the patient were personally reviewed by me and considered in my medical decision making (see lab/imaging section of note for values and interpretations).  Joan [name] is a 39 y.o. female who presents to [facility] today with complaints of Cough, Sore Throat, and Fever  Patient overall well appearing and in no acute distress today in clinic. Presenting symptoms (see HPI) and exam as documented above. She presents with symptoms associated with SARS-CoV-2 (novel coronavirus) following direct exposure. PMH (+) SARS-CoV-2 (novel coronavirus) in the last 90 days. Discussed that reinfection within 90 day window has not been documented by the CDC, however it is possible and therefore remains part of the DDx. Patient requesting to be re-tested today. Reviewed typical symptom constellation and potential for infection given occupational  exposures. SARS-CoV-2 swab collected by certified clinical staff. Discussed variable turn around times associated with testing, as swabs are being processed at [facility], and have been taking between 24-48 hours to come back. She was advised to self quarantine, per [facility] DHHS guidelines, until negative results received. These measures are being implemented out of an abundance of caution to prevent transmission and spread during the current SARS-CoV-2 pandemic.  PCR streptococcal throat swab (-). Influenza diagnotic test (PCR) resulted (-) for both the influenza A and B Ag. Exam consistent with AOME in patient's BILATERAL ears. Given symptoms and recent illness, will proceed with treatment. Allergies reviewed; PCN allergic, but can take cephalosporins. Will treat with a 10 day course of cefdinir. Discussed supportive care measures at home during acute phase of illness. Patient to rest as much as possible. She was encouraged  to ensure adequate hydration (water and ORS) to prevent dehydration and electrolyte derangements. Recommended warm salt water gargles, hard candies/lozenges, and hot tea with honey/lemon to help soothe the throat and reduce irritation. May use Tylenol and/or Ibuprofen as needed for pain/fever.    Current clinical condition warrants patient being out of work in order to quarantine while waiting for testing results. She was provided with the appropriate documentation to provide to her place of employment that will allow for her to RTW on 09/19/2019 with no restrictions. RTW is contingent on her SARS-CoV-2 test results being reviewed as negative.     Discussed follow up with primary care physician in 1 week for re-evaluation. I have reviewed the follow up and strict return precautions for any new or worsening symptoms. Patient is aware of symptoms that would be deemed urgent/emergent, and would thus require further evaluation either here or in the emergency department. At the time of discharge, she  verbalized understanding and consent with the discharge plan as it was reviewed with her. All questions were fielded by provider and/or clinic staff prior to patient discharge.    Final Clinical Impressions / Urgent Care Diagnoses:   Final diagnoses:  Acute upper respiratory infection  Acute otitis media, unspecified otitis media type  LAD (lymphadenopathy)  Encounter for laboratory testing for COVID-19 virus    New Prescriptions:  [place] Controlled Substance Registry consulted? Not Applicable  Meds ordered this encounter  Medications  . cefdinir (OMNICEF) 300 MG capsule    Sig: Take 1 capsule (300 mg total) by mouth 2 (two) times daily for 10 days.    Dispense:  20 capsule    Refill:  0    Recommended Follow up Care:  Patient encouraged to follow up with the following provider within the specified time frame, or sooner as dictated by the severity of her symptoms. As always, she was instructed that for any urgent/emergent care needs, she should seek care either here or in the emergency department for more immediate evaluation.  Follow-up Information    Doreene Nest, NP In 1 week.   Specialty: Internal Medicine Why: General reassessment of symptoms if not improving Contact information: [address] [place] [address] [phone]         NOTE: This note was prepared using Dragon dictation software along with smaller phrase technology. Despite my best ability to proofread, there is the potential that transcriptional errors may still occur from this process, and are completely unintentional.    Verlee Monte, NP 09/18/19 1249

## 2019-09-20 ENCOUNTER — Other Ambulatory Visit: Payer: Self-pay

## 2019-09-20 ENCOUNTER — Encounter: Payer: Self-pay | Admitting: Primary Care

## 2019-09-20 ENCOUNTER — Ambulatory Visit (INDEPENDENT_AMBULATORY_CARE_PROVIDER_SITE_OTHER): Payer: 59 | Admitting: Primary Care

## 2019-09-20 VITALS — BP 136/86 | HR 90 | Temp 97.4°F | Ht 66.0 in | Wt 294.5 lb

## 2019-09-20 DIAGNOSIS — J069 Acute upper respiratory infection, unspecified: Secondary | ICD-10-CM | POA: Diagnosis not present

## 2019-09-20 DIAGNOSIS — R509 Fever, unspecified: Secondary | ICD-10-CM

## 2019-09-20 LAB — CBC WITH DIFFERENTIAL/PLATELET
Basophils Absolute: 0 10*3/uL (ref 0.0–0.1)
Basophils Relative: 0.6 % (ref 0.0–3.0)
Eosinophils Absolute: 0.2 10*3/uL (ref 0.0–0.7)
Eosinophils Relative: 2.8 % (ref 0.0–5.0)
HCT: 42.6 % (ref 36.0–46.0)
Hemoglobin: 14 g/dL (ref 12.0–15.0)
Lymphocytes Relative: 32.5 % (ref 12.0–46.0)
Lymphs Abs: 2.1 10*3/uL (ref 0.7–4.0)
MCHC: 33 g/dL (ref 30.0–36.0)
MCV: 84.2 fl (ref 78.0–100.0)
Monocytes Absolute: 0.3 10*3/uL (ref 0.1–1.0)
Monocytes Relative: 5 % (ref 3.0–12.0)
Neutro Abs: 3.8 10*3/uL (ref 1.4–7.7)
Neutrophils Relative %: 59.1 % (ref 43.0–77.0)
Platelets: 282 10*3/uL (ref 150.0–400.0)
RBC: 5.05 Mil/uL (ref 3.87–5.11)
RDW: 13.8 % (ref 11.5–15.5)
WBC: 6.3 10*3/uL (ref 4.0–10.5)

## 2019-09-20 LAB — MONONUCLEOSIS SCREEN: Mono Screen: NEGATIVE

## 2019-09-20 NOTE — Assessment & Plan Note (Signed)
Unclear etiology, seems to be getting "sick" every 1-2 months, nearly always prescribed antibiotics.   Today she seems well, currently on another round of antibiotics. I do suspect myalgias are secondary to initiation of Crestor two weeks ago. We discussed this.  She is gaining better control over her diabetes which could also be leading to weakened immune system. Glucose readings are improving, but she was uncontrolled for a long period of time.  Also still could be experiencing effects from Covid-19 infection which was diagnosed in January 2021.  Check labs today including HIV, RPR, Mono, Hep C. Consider ID referral given recurrent illness.   

## 2019-09-20 NOTE — Progress Notes (Signed)
Subjective:    Patient ID: Joan [name], female    DOB: Jul 26, 1981, 39 y.o.   MRN: [id number]  HPI  This visit occurred during the SARS-CoV-2 public health emergency.  Safety protocols were in place, including screening questions prior to the visit, additional usage of staff PPE, and extensive cleaning of exam room while observing appropriate contact time as indicated for disinfecting solutions.   Joan [name] is a 39 year old female with a history of uncontrolled diabetes, hypertension, chronic asthma, MDD, PTSD, Covid-19 infection (diagnosed in January 2021), recurrent URI, recurrent fever, tachycardia who presents today with a chief complaint of fever.  She also reports sore throat, ear pain, fevers,body aches. Acute symptoms began three days ago. She has a history of these same symptoms that are recurrent and intermittent occurring every 1-2 months for the past year. She's been evaluated and treated at our clinic and Urgent Care centers for URI symptoms, typically receives antibiotics, sometimes steroids. She was treated with antibiotics for URI symptoms 5-10 times during 2020.  When she has symptoms of fevers her temperature will range 100-101 on average, most recent fever earlier this morning was 102.7.   Yesterday she started having urinary urgency but has difficulty urinating. She denies hematuria, dysuria, urinary frequency.   Her last visit was at urgent care on February 12th, tested negative for strep, flu, and Covid-19. She was told that she had an ear infection and was prescribed Cefdinir.   She's checking her blood sugars which are ranging 190's which is improved. Her diet hasn't been the best. She is compliant to rosuvastatin 10 mg which was initiated a few weeks ago. She denies uncontrolled asthma, feels well managed. Admits to worrying often but this has improved with resuming citalopram.   BP Readings from Last 3 Encounters:  09/20/19 136/86  09/17/19 (!) 138/93  09/08/19  130/83     Review of Systems  Constitutional: Positive for fever.  HENT: Positive for ear pain.   Respiratory: Negative for cough, shortness of breath and wheezing.   Cardiovascular: Negative for chest pain.  Musculoskeletal: Positive for myalgias.       Past Medical History:  Diagnosis Date  . Asthma   . Auditory hallucinations    only after anesthesia  . Diabetes mellitus without complication (HCC)    diet controlled  . Diabetes mellitus, type II (HCC)    insulin, jardiance  . Dyspnea    with exertion  . Essential hypertension   . Essential hypertension 11/24/2017  . GERD (gastroesophageal reflux disease)    occasionally-NO MEDS  . History of placement of ear tubes 05/20/2018  . Hyperlipidemia   . MDD (major depressive disorder)   . Miscarriage   . PTSD (post-traumatic stress disorder)   . Tachycardia      Social History   Socioeconomic History  . Marital status: Married    Spouse name: chip  . Number of children: 0  . Years of education: Not on file  . Highest education level: High school graduate  Occupational History  . Occupation: day care worker  Tobacco Use  . Smoking status: Never Smoker  . Smokeless tobacco: Never Used  Substance and Sexual Activity  . Alcohol use: No  . Drug use: No  . Sexual activity: Yes  Other Topics Concern  . Not on file  Social History Narrative  . Not on file   Social Determinants of Health   Financial Resource Strain:   . Difficulty of Paying Living Expenses:  Not on file  Food Insecurity:   . Worried About Charity fundraiser in the Last Year: Not on file  . Ran Out of Food in the Last Year: Not on file  Transportation Needs:   . Lack of Transportation (Medical): Not on file  . Lack of Transportation (Non-Medical): Not on file  Physical Activity:   . Days of Exercise per Week: Not on file  . Minutes of Exercise per Session: Not on file  Stress:   . Feeling of Stress : Not on file  Social Connections:   .  Frequency of Communication with Friends and Family: Not on file  . Frequency of Social Gatherings with Friends and Family: Not on file  . Attends Religious Services: Not on file  . Active Member of Clubs or Organizations: Not on file  . Attends Archivist Meetings: Not on file  . Marital Status: Not on file  Intimate Partner Violence:   . Fear of Current or Ex-Partner: Not on file  . Emotionally Abused: Not on file  . Physically Abused: Not on file  . Sexually Abused: Not on file    Past Surgical History:  Procedure Laterality Date  . ABDOMINAL HYSTERECTOMY    . ADENOIDECTOMY    . CHOLECYSTECTOMY  2009  . CYSTOSCOPY N/A 07/31/2018   Procedure: CYSTOSCOPY;  Surgeon: Benjaman Kindler, MD;  Location: [facility];  Service: Gynecology;  Laterality: N/A;  . LAPAROSCOPIC BILATERAL SALPINGECTOMY Bilateral 07/31/2018   Procedure: LAPAROSCOPIC BILATERAL SALPINGECTOMY;  Surgeon: Benjaman Kindler, MD;  Location: [facility];  Service: Gynecology;  Laterality: Bilateral;  . LAPAROSCOPIC HYSTERECTOMY N/A 07/31/2018   Procedure: HYSTERECTOMY TOTAL LAPAROSCOPIC;  Surgeon: Benjaman Kindler, MD;  Location: [facility];  Service: Gynecology;  Laterality: N/A;  . LAPAROSCOPY N/A 06/07/2019   Procedure: LAPAROSCOPY OPERATIVE, WITH PERITONEAL BIOPSIES;  Surgeon: Benjaman Kindler, MD;  Location: [facility];  Service: Gynecology;  Laterality: N/A;  . LYSIS OF ADHESION N/A 07/31/2018   Procedure: LYSIS OF ADHESION;  Surgeon: Benjaman Kindler, MD;  Location: [facility];  Service: Gynecology;  Laterality: N/A;  . OVARY SURGERY Right    cyst removed a while ago  . TONSILLECTOMY    . tubes in ear      Family History  Problem Relation Age of Onset  . Asthma Mother   . Diabetes Mother   . Hyperlipidemia Mother   . Hypertension Mother   . Diabetes Father   . Hyperlipidemia Father   . Hypertension Father   . Diabetes Brother   . Depression Brother   . Alcohol abuse Brother   . Depression Maternal  Grandmother   . Stroke Paternal Grandmother   . Breast cancer Neg Hx     Allergies  Allergen Reactions  . Ibuprofen Swelling    Facial   . Ciprofloxacin Hives and Rash  . Metformin And Related Other (See Comments)    Elevated Lactic Acid  . Penicillins Hives and Rash    Has patient had a PCN reaction causing immediate rash, facial/tongue/throat swelling, SOB or lightheadedness with hypotension: No Has patient had a PCN reaction causing severe rash involving mucus membranes or skin necrosis: No Has patient had a PCN reaction that required hospitalization: No Has patient had a PCN reaction occurring within the last 10 years: No If all of the above answers are "NO", then may proceed with Cephalosporin use. THE PATIENT IS ABLE TO TOLERATE CEPHALOSPORINS WITHOUT DIFFIC  . Sulfa Antibiotics Hives and Rash  . Other  ALL NUTS-SCRATCHY THROAT  . Shellfish Allergy Other (See Comments)    ALL SEAFOOD-SCRATCHY THROAT    Current Outpatient Medications on File Prior to Visit  Medication Sig Dispense Refill  . albuterol (VENTOLIN HFA) 108 (90 Base) MCG/ACT inhaler INHALE 2 PUFFS BY MOUTH EVERY 6 HOURS AS NEEDED FOR SHORTNESS OF BREATH AND WHEEZING (Patient taking differently: Inhale 2 puffs into the lungs every 6 (six) hours as needed for wheezing or shortness of breath. INHALE 2 PUFFS BY MOUTH EVERY 6 HOURS AS NEEDED FOR SHORTNESS OF BREATH AND WHEEZING) 18 g 0  . blood glucose meter kit and supplies KIT Dispense based on patient and insurance preference. Use up three times daily as directed. (FOR ICD-9 250.00, 250.01). 1 each 0  . cefdinir (OMNICEF) 300 MG capsule Take 1 capsule (300 mg total) by mouth 2 (two) times daily for 10 days. 20 capsule 0  . citalopram (CELEXA) 20 MG tablet Take 1 tablet (20 mg total) by mouth daily. For anxiety. 90 tablet 0  . Continuous Blood Gluc Receiver (FREESTYLE LIBRE 14 DAY READER) DEVI 1 Device by Does not apply route 3 (three) times daily as needed. 1 each 0    . Continuous Blood Gluc Sensor (FREESTYLE LIBRE 14 DAY SENSOR) MISC 1 Device by Does not apply route 3 (three) times daily as needed. 2 each 2  . Dulaglutide (TRULICITY) 1.5 EZ/5.0ZT SOPN Inject 1.5 mg into the skin once a week. 2 mL 2  . insulin aspart (NOVOLOG FLEXPEN) 100 UNIT/ML FlexPen Inject 8 Units into the skin 3 (three) times daily with meals. 15 mL 3  . Insulin Glargine (BASAGLAR KWIKPEN) 100 UNIT/ML SOPN Inject 30 units in the morning and 50 units in the evening for diabetes. 30 mL 5  . Insulin Pen Needle (PEN NEEDLES) 31G X 6 MM MISC Use with insulin as directed. 100 each 2  . losartan (COZAAR) 25 MG tablet Take 1 tablet (25 mg total) by mouth daily. For blood pressure. 90 tablet 0  . metoprolol tartrate (LOPRESSOR) 25 MG tablet Take 1 tablet (25 mg total) by mouth 2 (two) times daily. 60 tablet 0  . montelukast (SINGULAIR) 10 MG tablet Take 10 mg by mouth at bedtime.    . pantoprazole (PROTONIX) 40 MG tablet Take 1 tablet (40 mg total) by mouth daily. 30 tablet 0  . rosuvastatin (CRESTOR) 10 MG tablet Take 1 tablet (10 mg total) by mouth every evening. For cholesterol. 90 tablet 3   No current facility-administered medications on file prior to visit.    BP 136/86   Pulse 90   Temp (!) 97.4 F (36.3 C) (Temporal)   Ht '5\' 6"'  (1.676 m)   Wt 294 lb 8 oz (133.6 kg)   LMP 07/24/2018 (Exact Date) Comment: surgery 07/31/2018  SpO2 97%   BMI 47.53 kg/m    Objective:   Physical Exam  Constitutional: She appears well-nourished. She does not appear ill.  HENT:  Right TM with cloudy appearance, no erythema.   Cardiovascular: Normal rate and regular rhythm.  Respiratory: Effort normal and breath sounds normal.  Musculoskeletal:     Cervical back: Neck supple.  Skin: Skin is warm and dry.  Psychiatric: She has a normal mood and affect.           Assessment & Plan:

## 2019-09-20 NOTE — Assessment & Plan Note (Signed)
Unclear etiology, seems to be getting "sick" every 1-2 months, nearly always prescribed antibiotics.   Today she seems well, currently on another round of antibiotics. I do suspect myalgias are secondary to initiation of Crestor two weeks ago. We discussed this.  She is gaining better control over her diabetes which could also be leading to weakened immune system. Glucose readings are improving, but she was uncontrolled for a long period of time.  Also still could be experiencing effects from Covid-19 infection which was diagnosed in January 2021.  Check labs today including HIV, RPR, Mono, Hep C. Consider ID referral given recurrent illness.

## 2019-09-20 NOTE — Patient Instructions (Signed)
Stop by the lab prior to leaving today. I will notify you of your results once received.   Take Tylenol as needed for fevers.   Try to stick with the rosuvastatin (Crestor) medication for now. This is likely causing the body aches.   It was a pleasure to see you today!

## 2019-09-20 NOTE — Telephone Encounter (Signed)
Pt c/o fever(100.1), sore throat , HA, fatigue and aching, R ear pain. Pt was in [facility] on 2/12 and given abx for URI and ear infection. Pt tested neg for covid and flu at that apt. Per PCP pt needs to be seen today. Scheduled pt for apt today at 10:40. Pt verbalized understanding.

## 2019-09-20 NOTE — Telephone Encounter (Signed)
Joan [name], this patient needs evaluation. She's already had Covid-19 infection in Janaury 2021, retested for flu and Covid a few days ago which were negative. Will you please contact her for screening and get her in with me as soon as possible?

## 2019-09-21 DIAGNOSIS — J069 Acute upper respiratory infection, unspecified: Secondary | ICD-10-CM

## 2019-09-21 DIAGNOSIS — R509 Fever, unspecified: Secondary | ICD-10-CM

## 2019-09-21 LAB — HEPATITIS C ANTIBODY
Hepatitis C Ab: NONREACTIVE
SIGNAL TO CUT-OFF: 0.17 (ref <1.00)

## 2019-09-21 LAB — RPR: RPR Ser Ql: NONREACTIVE

## 2019-09-21 LAB — HIV ANTIBODY (ROUTINE TESTING W REFLEX): HIV 1&2 Ab, 4th Generation: NONREACTIVE

## 2019-09-21 NOTE — Telephone Encounter (Signed)
Shirlee Limerick, I'm putting in an infectious disease referral for this patient. Any chance we can get her in this week? It's not super urgent but I'd like her seen sooner than later.

## 2019-09-23 ENCOUNTER — Ambulatory Visit: Payer: 59 | Admitting: Infectious Diseases

## 2019-09-26 ENCOUNTER — Other Ambulatory Visit: Payer: Self-pay | Admitting: Medical

## 2019-09-28 ENCOUNTER — Encounter: Payer: Self-pay | Admitting: Infectious Diseases

## 2019-09-28 ENCOUNTER — Ambulatory Visit: Payer: 59 | Attending: Infectious Diseases | Admitting: Infectious Diseases

## 2019-09-28 ENCOUNTER — Other Ambulatory Visit
Admission: RE | Admit: 2019-09-28 | Discharge: 2019-09-28 | Disposition: A | Discharge status: Home / Self Care | Payer: 59 | Source: Ambulatory Visit | Attending: Infectious Diseases | Admitting: Infectious Diseases

## 2019-09-28 ENCOUNTER — Other Ambulatory Visit: Payer: Self-pay

## 2019-09-28 VITALS — BP 119/84 | HR 93 | Temp 98.7°F | Resp 16 | Ht 66.0 in | Wt 295.0 lb

## 2019-09-28 DIAGNOSIS — E119 Type 2 diabetes mellitus without complications: Secondary | ICD-10-CM | POA: Diagnosis not present

## 2019-09-28 DIAGNOSIS — E785 Hyperlipidemia, unspecified: Secondary | ICD-10-CM | POA: Diagnosis not present

## 2019-09-28 DIAGNOSIS — Z7901 Long term (current) use of anticoagulants: Secondary | ICD-10-CM | POA: Insufficient documentation

## 2019-09-28 DIAGNOSIS — M7502 Adhesive capsulitis of left shoulder: Secondary | ICD-10-CM

## 2019-09-28 DIAGNOSIS — R509 Fever, unspecified: Secondary | ICD-10-CM | POA: Insufficient documentation

## 2019-09-28 DIAGNOSIS — Z91013 Allergy to seafood: Secondary | ICD-10-CM

## 2019-09-28 DIAGNOSIS — Z90722 Acquired absence of ovaries, bilateral: Secondary | ICD-10-CM

## 2019-09-28 DIAGNOSIS — F4310 Post-traumatic stress disorder, unspecified: Secondary | ICD-10-CM | POA: Diagnosis not present

## 2019-09-28 DIAGNOSIS — Z881 Allergy status to other antibiotic agents status: Secondary | ICD-10-CM

## 2019-09-28 DIAGNOSIS — Z794 Long term (current) use of insulin: Secondary | ICD-10-CM | POA: Diagnosis not present

## 2019-09-28 DIAGNOSIS — Z886 Allergy status to analgesic agent status: Secondary | ICD-10-CM

## 2019-09-28 DIAGNOSIS — K219 Gastro-esophageal reflux disease without esophagitis: Secondary | ICD-10-CM | POA: Insufficient documentation

## 2019-09-28 DIAGNOSIS — I10 Essential (primary) hypertension: Secondary | ICD-10-CM | POA: Diagnosis not present

## 2019-09-28 DIAGNOSIS — R1031 Right lower quadrant pain: Secondary | ICD-10-CM

## 2019-09-28 DIAGNOSIS — Z888 Allergy status to other drugs, medicaments and biological substances status: Secondary | ICD-10-CM

## 2019-09-28 DIAGNOSIS — M797 Fibromyalgia: Secondary | ICD-10-CM

## 2019-09-28 DIAGNOSIS — Z880 Allergy status to penicillin: Secondary | ICD-10-CM

## 2019-09-28 DIAGNOSIS — U071 COVID-19: Secondary | ICD-10-CM

## 2019-09-28 DIAGNOSIS — Z90710 Acquired absence of both cervix and uterus: Secondary | ICD-10-CM

## 2019-09-28 DIAGNOSIS — Z8616 Personal history of COVID-19: Secondary | ICD-10-CM

## 2019-09-28 DIAGNOSIS — Z79899 Other long term (current) drug therapy: Secondary | ICD-10-CM | POA: Diagnosis not present

## 2019-09-28 DIAGNOSIS — F329 Major depressive disorder, single episode, unspecified: Secondary | ICD-10-CM

## 2019-09-28 LAB — SEDIMENTATION RATE: Sed Rate: 31 mm/hr — ABNORMAL HIGH (ref 0–20)

## 2019-09-28 LAB — LACTATE DEHYDROGENASE: LDH: 118 U/L (ref 98–192)

## 2019-09-28 LAB — CK: Total CK: 58 U/L (ref 38–234)

## 2019-09-28 LAB — FERRITIN: Ferritin: 86 ng/mL (ref 11–307)

## 2019-09-28 NOTE — Progress Notes (Addendum)
NAME: Joan [name]  DOB: 1980-09-11  MRN: [id number]  Date/Time: 09/28/2019 12:04 PM  Subjective:  Referred to me By Loma Boston for recurrent fever, UR symptoms and generalized body ache ? Joan [name] is a 39 y.o. female with a history of DM, HTN, MDD tested positive for COVID Jan 2nd at [facility] by antigen test as he whole family was sick then. Since that she has had intermittent fever and body aches and fatigue and feeling low. She gets fever only at night and no high temperature has been recorded in her PCP's office during her last visit on 09/20/19. covid test was neg on 09/17/19 and many times before that in Epic.' As per patient for the past year or more she has had recurrent upper respiratory symptoms including sinus pain, congestion and has seen ENT and pulmonary.and her primary many times.  Given multiple courses of antibiotics but has had neg strep antigen- only positive test sept 2019 was influenza. Recent monospot, HIV, HEPC, RPR all negative  She used to work in a day care but has not been to work for the past 2 weeks because of fever  She has been c/o rt lower quadrant abdominal pain and multiple Ct scans have been done ( rt adrenal nodule-stable).She has been to the ED many times for abdominal pain.   On 06/07/19 she underwent lap by Gyn Dr. Leafy Ro for abdominal pain-- Absent urterus, right ovary. No pelvic adhesions. +minimal adhesions in RLQ, and normal appendix without adhesions. These were removed. Left [place] ligament nodule removed and sent to pathology. Mild adhesions between left ovary and sigmoid colon, not pathologic and left alone. No adhesions to the vaginal cuff, which was visualized well, and no evidence of endometriosis in posterior cul de sac. Peritoneal biopsies were taken and sent to pathology. No other abdominal/pelvic abnormality.  Normal upper abdomen.  On 07/31/18 she underwent Total laparoscopic hysterectomy, bilateral salpingectomy, right  oophorectomy,  cystoscopy, lysis of adhesions for abnormal bleeding and class 3 obesity( pathology BENIGN PROLIFERATIVE ENDOMETRIUM WITH STROMAL HEMORRHAGE AND BENIGN  MYOMETRIUM)  Past Medical History:  Diagnosis Date  . Asthma   . Auditory hallucinations    only after anesthesia  . Diabetes mellitus without complication (HCC)    diet controlled  . Diabetes mellitus, type II (HCC)    insulin, jardiance  . Dyspnea    with exertion  . Essential hypertension   . Essential hypertension 11/24/2017  . GERD (gastroesophageal reflux disease)    occasionally-NO MEDS  . History of placement of ear tubes 05/20/2018  . Hyperlipidemia   . MDD (major depressive disorder)   . Miscarriage   . PTSD (post-traumatic stress disorder)   . Tachycardia     Past Surgical History:  Procedure Laterality Date  . ABDOMINAL HYSTERECTOMY    . ADENOIDECTOMY    . CHOLECYSTECTOMY  2009  . CYSTOSCOPY N/A 07/31/2018   Procedure: CYSTOSCOPY;  Surgeon: Benjaman Kindler, MD;  Location: [facility];  Service: Gynecology;  Laterality: N/A;  . LAPAROSCOPIC BILATERAL SALPINGECTOMY Bilateral 07/31/2018   Procedure: LAPAROSCOPIC BILATERAL SALPINGECTOMY;  Surgeon: Benjaman Kindler, MD;  Location: [facility];  Service: Gynecology;  Laterality: Bilateral;  . LAPAROSCOPIC HYSTERECTOMY N/A 07/31/2018   Procedure: HYSTERECTOMY TOTAL LAPAROSCOPIC;  Surgeon: Benjaman Kindler, MD;  Location: [facility];  Service: Gynecology;  Laterality: N/A;  . LAPAROSCOPY N/A 06/07/2019   Procedure: LAPAROSCOPY OPERATIVE, WITH PERITONEAL BIOPSIES;  Surgeon: Benjaman Kindler, MD;  Location: [facility];  Service: Gynecology;  Laterality: N/A;  . LYSIS  OF ADHESION N/A 07/31/2018   Procedure: LYSIS OF ADHESION;  Surgeon: Benjaman Kindler, MD;  Location: [facility];  Service: Gynecology;  Laterality: N/A;  . OVARY SURGERY Right    cyst removed a while ago  . TONSILLECTOMY    . tubes in ear      Social History   Socioeconomic History  . Marital status: Married     Spouse name: chip  . Number of children: 0  . Years of education: Not on file  . Highest education level: High school graduate  Occupational History  . Occupation: day care worker  Tobacco Use  . Smoking status: Never Smoker  . Smokeless tobacco: Never Used  Substance and Sexual Activity  . Alcohol use: No  . Drug use: No  . Sexual activity: Yes  Other Topics Concern  . Not on file  Social History Narrative  . Not on file   Social Determinants of Health   Financial Resource Strain:   . Difficulty of Paying Living Expenses: Not on file  Food Insecurity:   . Worried About Charity fundraiser in the Last Year: Not on file  . Ran Out of Food in the Last Year: Not on file  Transportation Needs:   . Lack of Transportation (Medical): Not on file  . Lack of Transportation (Non-Medical): Not on file  Physical Activity:   . Days of Exercise per Week: Not on file  . Minutes of Exercise per Session: Not on file  Stress:   . Feeling of Stress : Not on file  Social Connections:   . Frequency of Communication with Friends and Family: Not on file  . Frequency of Social Gatherings with Friends and Family: Not on file  . Attends Religious Services: Not on file  . Active Member of Clubs or Organizations: Not on file  . Attends Archivist Meetings: Not on file  . Marital Status: Not on file  Intimate Partner Violence:   . Fear of Current or Ex-Partner: Not on file  . Emotionally Abused: Not on file  . Physically Abused: Not on file  . Sexually Abused: Not on file    Family History  Problem Relation Age of Onset  . Asthma Mother   . Diabetes Mother   . Hyperlipidemia Mother   . Hypertension Mother   . Diabetes Father   . Hyperlipidemia Father   . Hypertension Father   . Diabetes Brother   . Depression Brother   . Alcohol abuse Brother   . Depression Maternal Grandmother   . Stroke Paternal Grandmother   . Breast cancer Neg Hx    Allergies  Allergen Reactions  .  Ibuprofen Swelling    Facial   . Ciprofloxacin Hives and Rash  . Metformin And Related Other (See Comments)    Elevated Lactic Acid  . Penicillins Hives and Rash    Has patient had a PCN reaction causing immediate rash, facial/tongue/throat swelling, SOB or lightheadedness with hypotension: No Has patient had a PCN reaction causing severe rash involving mucus membranes or skin necrosis: No Has patient had a PCN reaction that required hospitalization: No Has patient had a PCN reaction occurring within the last 10 years: No If all of the above answers are "NO", then may proceed with Cephalosporin use. THE PATIENT IS ABLE TO TOLERATE CEPHALOSPORINS WITHOUT DIFFIC  . Sulfa Antibiotics Hives and Rash  . Other     ALL NUTS-SCRATCHY THROAT  . Shellfish Allergy Other (See Comments)  ALL SEAFOOD-SCRATCHY THROAT   ID  Recent  Procedure Surgery Injections Trauma Sick contacts Travel Antibiotic use Food- raw/exotic Steroid/immune suppressants/splenectomy/Hardware Animal bites Tick exposure Water sports Fishing/hunting/animal bird exposure ? Current Outpatient Medications  Medication Sig Dispense Refill  . albuterol (VENTOLIN HFA) 108 (90 Base) MCG/ACT inhaler INHALE 2 PUFFS BY MOUTH EVERY 6 HOURS AS NEEDED FOR SHORTNESS OF BREATH AND WHEEZING (Patient taking differently: Inhale 2 puffs into the lungs every 6 (six) hours as needed for wheezing or shortness of breath. INHALE 2 PUFFS BY MOUTH EVERY 6 HOURS AS NEEDED FOR SHORTNESS OF BREATH AND WHEEZING) 18 g 0  . blood glucose meter kit and supplies KIT Dispense based on patient and insurance preference. Use up three times daily as directed. (FOR ICD-9 250.00, 250.01). 1 each 0  . citalopram (CELEXA) 20 MG tablet Take 1 tablet (20 mg total) by mouth daily. For anxiety. 90 tablet 0  . Continuous Blood Gluc Receiver (FREESTYLE LIBRE 14 DAY READER) DEVI 1 Device by Does not apply route 3 (three) times daily as needed. 1 each 0  . Continuous  Blood Gluc Sensor (FREESTYLE LIBRE 14 DAY SENSOR) MISC 1 Device by Does not apply route 3 (three) times daily as needed. 2 each 2  . Dulaglutide (TRULICITY) 1.5 VE/9.3YB SOPN Inject 1.5 mg into the skin once a week. 2 mL 2  . insulin aspart (NOVOLOG FLEXPEN) 100 UNIT/ML FlexPen Inject 8 Units into the skin 3 (three) times daily with meals. 15 mL 3  . Insulin Glargine (BASAGLAR KWIKPEN) 100 UNIT/ML SOPN Inject 30 units in the morning and 50 units in the evening for diabetes. 30 mL 5  . Insulin Pen Needle (PEN NEEDLES) 31G X 6 MM MISC Use with insulin as directed. 100 each 2  . losartan (COZAAR) 25 MG tablet Take 1 tablet (25 mg total) by mouth daily. For blood pressure. 90 tablet 0  . metoprolol tartrate (LOPRESSOR) 25 MG tablet TAKE 1 TABLET(25 MG) BY MOUTH TWICE DAILY 60 tablet 0  . montelukast (SINGULAIR) 10 MG tablet Take 10 mg by mouth at bedtime.    . pantoprazole (PROTONIX) 40 MG tablet Take 1 tablet (40 mg total) by mouth daily. 30 tablet 0  . rosuvastatin (CRESTOR) 10 MG tablet Take 1 tablet (10 mg total) by mouth every evening. For cholesterol. 90 tablet 3   No current facility-administered medications for this visit.     Abtx:  Anti-infectives (From admission, onward)   None      REVIEW OF SYSTEMS:  Const: fever, negative chills, negative weight loss Eyes: negative diplopia or visual changes, negative eye pain ENT: intermittent  coryza, intermittent  sore throat Resp: negative cough, hemoptysis, has dyspnea Cards: negative for chest pain, palpitations, lower extremity edema GU: negative for frequency, dysuria and hematuria GI: rt lower quadrant t abdominal pain, no diarrhea, bleeding, constipation Skin: negative for rash and pruritus Heme: negative for easy bruising and gum/nose bleeding MS: ++ myalgias, arthralgias, back pain and muscle weakness Left frozen shoulder Neurolo headaches, dizziness, vertigo, memory problems  Psych:  feelings of anxiety, depression  Endocrine:  has diabetes Allergy/Immunology- as above?  Objective:  VITALS:  BP 119/84   Pulse 93   Temp 98.7 F (37.1 C)   Resp 16   Ht '5\' 6"'  (1.676 m)   Wt 295 lb (133.8 kg)   LMP 07/24/2018 (Exact Date) Comment: surgery 07/31/2018  SpO2 98%   BMI 47.61 kg/m  PHYSICAL EXAM:  General: Alert, cooperative, no distress, BMI 47  Head: Normocephalic, without obvious abnormality, atraumatic. Eyes: Conjunctivae clear, anicteric sclerae. Pupils are equal ENT Nares normal. No drainage or sinus tenderness. Lips, mucosa, and tongue normal. No Thrush Neck: Supple, symmetrical, no adenopathy, thyroid: non tender no carotid bruit and no JVD. Back: No CVA tenderness. Lungs: Clear to auscultation bilaterally. No Wheezing or Rhonchi. No rales. Heart: Regular rate and rhythm, no murmur, rub or gallop. Abdomen: Soft, non-tender,not distended. Bowel sounds normal. No masses Extremities: left shoulder restricted abduction, atraumatic, no cyanosis. No edema. No clubbing Skin: Tattoos, brusing at the site of insulin Corwith abdominal wall Lymph: Cervical, supraclavicular normal. Neurologic: Grossly non-focal Pertinent Labs Lab Results CBC    Component Value Date/Time   WBC 6.3 09/20/2019 1117   RBC 5.05 09/20/2019 1117   HGB 14.0 09/20/2019 1117   HGB WILL FOLLOW 08/30/2019 2011   HCT 42.6 09/20/2019 1117   HCT WILL FOLLOW 08/30/2019 2011   PLT 282.0 09/20/2019 1117   PLT WILL FOLLOW 08/30/2019 2011   MCV 84.2 09/20/2019 1117   MCV WILL FOLLOW 08/30/2019 2011   MCH 27.5 09/08/2019 1116   MCHC 33.0 09/20/2019 1117   RDW 13.8 09/20/2019 1117   RDW WILL FOLLOW 08/30/2019 2011   LYMPHSABS 2.1 09/20/2019 1117   LYMPHSABS WILL FOLLOW 08/30/2019 2011   MONOABS 0.3 09/20/2019 1117   EOSABS 0.2 09/20/2019 1117   EOSABS WILL FOLLOW 08/30/2019 2011   BASOSABS 0.0 09/20/2019 1117   BASOSABS WILL FOLLOW 08/30/2019 2011    CMP Latest Ref Rng & Units 09/08/2019 09/03/2019 08/20/2019  Glucose 70 - 99 mg/dL  246(H) 247(H) 313(H)  BUN 6 - 20 mg/dL '20 12 15  ' Creatinine 0.44 - 1.00 mg/dL 0.71 0.68 0.56  Sodium 135 - 145 mmol/L 131(L) 135 132(L)  Potassium 3.5 - 5.1 mmol/L 4.0 4.7 4.2  Chloride 98 - 111 mmol/L 96(L) 99 99  CO2 22 - 32 mmol/L '24 28 23  ' Calcium 8.9 - 10.3 mg/dL 9.2 9.4 9.1  Total Protein 6.5 - 8.1 g/dL 7.8 - -  Total Bilirubin 0.3 - 1.2 mg/dL 0.9 - -  Alkaline Phos 38 - 126 U/L 130(H) - -  AST 15 - 41 U/L 24 - -  ALT 0 - 44 U/L 40 - -    ? Impression/Recommendation ? 39 yr female with h/o HTN, DM has had URI, fever, fatigue, going on for more than a year, recent COVID test positive in Jan 2, and since has had fever and fatigue. Says she gets fever only at night time. No fever in my office or in her PCP's office No findings on clinical examination except left shoulder mobility restriction Does she have underlying fibromyalgia // long covid ( she was never hospitalized for COVID and not sure of the severity of her illness) Asked her to monitor her temp and keep a log Today will do some inflammatory markers ESR/CRP/Ferritin, LDH, Quantiferon gold, EBV, CMV antibodies) Blood culture PCP can refer her to [place] [facility] at [facility]  If fever persist only at night time may observe in  hospital  to monitor and manage. Also PCP can look into autoimmune causes though less likely Discussed with patient. Will let her know once the tests are resulted  Addendum some of the inflammatory markers have returned normal  Labs ESR 31  CK 58 Ferritin 86 LDH 118? ? ___________________________________________________  09/30/19- BC neg so far CMV/EBV IGG positive indicative of past infection ESR N CK/LDH/CPK Normal Ferritin Normal quantiferon gold pending ( but was neg  before) As no documented fever during my visit and her PCp's visit hard to say whether she really has an infection- informed patient thru My chart. Spoke to her PCP Alma Friendly today - recommended autoimmune work up and if  fever only at night may refer to hem/onc to r/o pel-ebstein fever or monitor in a hospital setting if needed.

## 2019-09-28 NOTE — Patient Instructions (Signed)
You are here for fever, body ache and myalgias going on for a few weeks since you had covid in Jan but also you have had these symptoms along with upper resp symptoms since 1 year- will do a fever work up including some labs- please monitor your temp every night and keep a log

## 2019-09-29 LAB — EPSTEIN-BARR VIRUS (EBV) ANTIBODY PROFILE
EBV NA IgG: 75.4 U/mL — ABNORMAL HIGH (ref 0.0–17.9)
EBV VCA IgG: 55.7 U/mL — ABNORMAL HIGH (ref 0.0–17.9)
EBV VCA IgM: <36 U/mL (ref 0.0–35.9)

## 2019-09-29 LAB — C-REACTIVE PROTEIN: CRP: 3.3 mg/dL — ABNORMAL HIGH (ref <1.0)

## 2019-09-29 LAB — CMV IGM: CMV IgM: <30 AU/mL (ref 0.0–29.9)

## 2019-09-29 LAB — CMV ANTIBODY, IGG (EIA): CMV Ab - IgG: >10 U/mL — ABNORMAL HIGH (ref 0.00–0.59)

## 2019-09-30 LAB — QUANTIFERON-TB GOLD PLUS: QuantiFERON-TB Gold Plus: NEGATIVE

## 2019-09-30 LAB — QUANTIFERON-TB GOLD PLUS (RQFGPL)
QuantiFERON Mitogen Value: >10 IU/mL
QuantiFERON Nil Value: 0.26 IU/mL
QuantiFERON TB1 Ag Value: 0.18 IU/mL
QuantiFERON TB2 Ag Value: 0.14 IU/mL

## 2019-10-01 ENCOUNTER — Other Ambulatory Visit (INDEPENDENT_AMBULATORY_CARE_PROVIDER_SITE_OTHER): Payer: 59

## 2019-10-01 DIAGNOSIS — R509 Fever, unspecified: Secondary | ICD-10-CM | POA: Diagnosis not present

## 2019-10-01 DIAGNOSIS — J069 Acute upper respiratory infection, unspecified: Secondary | ICD-10-CM

## 2019-10-03 LAB — CULTURE, BLOOD (ROUTINE X 2)
Culture: NO GROWTH
Culture: NO GROWTH
Special Requests: ADEQUATE
Special Requests: ADEQUATE

## 2019-10-04 ENCOUNTER — Ambulatory Visit (INDEPENDENT_AMBULATORY_CARE_PROVIDER_SITE_OTHER)
Admission: EM | Admit: 2019-10-04 | Discharge: 2019-10-04 | Disposition: A | Discharge status: Home / Self Care | Payer: 59 | Source: Home / Self Care | Attending: Family Medicine | Admitting: Family Medicine

## 2019-10-04 ENCOUNTER — Emergency Department
Admission: EM | Admit: 2019-10-04 | Discharge: 2019-10-04 | Disposition: A | Discharge status: Home / Self Care | Payer: 59 | Attending: Emergency Medicine | Admitting: Emergency Medicine

## 2019-10-04 ENCOUNTER — Other Ambulatory Visit: Payer: Self-pay

## 2019-10-04 ENCOUNTER — Emergency Department: Payer: 59

## 2019-10-04 ENCOUNTER — Encounter: Payer: Self-pay | Admitting: Emergency Medicine

## 2019-10-04 ENCOUNTER — Encounter: Payer: Self-pay | Admitting: Family Medicine

## 2019-10-04 DIAGNOSIS — E119 Type 2 diabetes mellitus without complications: Secondary | ICD-10-CM | POA: Insufficient documentation

## 2019-10-04 DIAGNOSIS — R112 Nausea with vomiting, unspecified: Secondary | ICD-10-CM

## 2019-10-04 DIAGNOSIS — R1031 Right lower quadrant pain: Secondary | ICD-10-CM

## 2019-10-04 DIAGNOSIS — Z87442 Personal history of urinary calculi: Secondary | ICD-10-CM | POA: Insufficient documentation

## 2019-10-04 DIAGNOSIS — Z8616 Personal history of COVID-19: Secondary | ICD-10-CM | POA: Insufficient documentation

## 2019-10-04 DIAGNOSIS — Z794 Long term (current) use of insulin: Secondary | ICD-10-CM | POA: Insufficient documentation

## 2019-10-04 DIAGNOSIS — R1013 Epigastric pain: Secondary | ICD-10-CM | POA: Diagnosis not present

## 2019-10-04 LAB — COMPREHENSIVE METABOLIC PANEL
ALT: 31 U/L (ref 0–44)
AST: 21 U/L (ref 15–41)
Albumin: 3.9 g/dL (ref 3.5–5.0)
Alkaline Phosphatase: 105 U/L (ref 38–126)
Anion gap: 10 (ref 5–15)
BUN: 12 mg/dL (ref 6–20)
CO2: 28 mmol/L (ref 22–32)
Calcium: 9.2 mg/dL (ref 8.9–10.3)
Chloride: 98 mmol/L (ref 98–111)
Creatinine, Ser: 0.66 mg/dL (ref 0.44–1.00)
GFR calc Af Amer: >60 mL/min (ref >60)
GFR calc non Af Amer: >60 mL/min (ref >60)
Glucose, Bld: 187 mg/dL — ABNORMAL HIGH (ref 70–99)
Potassium: 4.6 mmol/L (ref 3.5–5.1)
Sodium: 136 mmol/L (ref 135–145)
Total Bilirubin: 0.4 mg/dL (ref 0.3–1.2)
Total Protein: 7.4 g/dL (ref 6.5–8.1)

## 2019-10-04 LAB — CBC
HCT: 42 % (ref 36.0–46.0)
Hemoglobin: 13.7 g/dL (ref 12.0–15.0)
MCH: 27.6 pg (ref 26.0–34.0)
MCHC: 32.6 g/dL (ref 30.0–36.0)
MCV: 84.7 fL (ref 80.0–100.0)
Platelets: 329 10*3/uL (ref 150–400)
RBC: 4.96 MIL/uL (ref 3.87–5.11)
RDW: 13.4 % (ref 11.5–15.5)
WBC: 7.1 10*3/uL (ref 4.0–10.5)
nRBC: 0 % (ref 0.0–0.2)

## 2019-10-04 LAB — CYCLIC CITRUL PEPTIDE ANTIBODY, IGG: Cyclic Citrullin Peptide Ab: <16 UNITS

## 2019-10-04 LAB — URINALYSIS, COMPLETE (UACMP) WITH MICROSCOPIC
Bilirubin Urine: NEGATIVE
Glucose, UA: NEGATIVE mg/dL
Hgb urine dipstick: NEGATIVE
Ketones, ur: NEGATIVE mg/dL
Leukocytes,Ua: NEGATIVE
Nitrite: NEGATIVE
Protein, ur: NEGATIVE mg/dL
Specific Gravity, Urine: 1.025 (ref 1.005–1.030)
WBC, UA: NONE SEEN WBC/hpf (ref 0–5)
pH: 7 (ref 5.0–8.0)

## 2019-10-04 LAB — RHEUMATOID FACTOR: Rheumatoid fact SerPl-aCnc: <14 IU/mL (ref <14)

## 2019-10-04 LAB — ANA: Anti Nuclear Antibody (ANA): NEGATIVE

## 2019-10-04 LAB — LIPASE, BLOOD: Lipase: 25 U/L (ref 11–51)

## 2019-10-04 MED ORDER — ONDANSETRON 8 MG PO TBDP
8.0000 mg | ORAL_TABLET | Freq: Once | ORAL | Status: AC
  Administered 2019-10-04: 8 mg via ORAL

## 2019-10-04 MED ORDER — ONDANSETRON HCL 4 MG/2ML IJ SOLN
4.0000 mg | Freq: Once | INTRAMUSCULAR | Status: AC
  Administered 2019-10-04: 4 mg via INTRAVENOUS
  Filled 2019-10-04: qty 2

## 2019-10-04 MED ORDER — SODIUM CHLORIDE 0.9% FLUSH: 3.0000 mL | Freq: Once | INTRAVENOUS | Status: DC

## 2019-10-04 MED ORDER — SODIUM CHLORIDE 0.9 % IV SOLN
1000.0000 mL | Freq: Once | INTRAVENOUS | Status: AC
  Administered 2019-10-04: 1000 mL via INTRAVENOUS

## 2019-10-04 MED ORDER — ONDANSETRON 4 MG PO TBDP: 4.0000 mg | ORAL_TABLET | Freq: Three times a day (TID) | ORAL | 0 refills | Status: DC | PRN

## 2019-10-04 MED ORDER — IOHEXOL 300 MG/ML  SOLN
125.0000 mL | Freq: Once | INTRAMUSCULAR | Status: AC | PRN
  Administered 2019-10-04: 125 mL via INTRAVENOUS
  Filled 2019-10-04: qty 125

## 2019-10-04 NOTE — ED Triage Notes (Signed)
C?O vomiting since Saturday night. States unable to tolerate any po.  Seen initially at [facility] ([facility]), and referred to ED due to RLQ abdominal pain tenderness with palpation.  Patient AAOx3.  Skin warm and dry. NAD

## 2019-10-04 NOTE — ED Triage Notes (Addendum)
Patient c/o vomiting that started Saturday night. She states every time she eats or drinks something it comes right back up. She also complains of headache that started Saturday. Patient states she tested negative for COVID last week.

## 2019-10-04 NOTE — ED Provider Notes (Addendum)
[facility]    CSN: [id number] Arrival date & time: 10/04/19  1209      History   Chief Complaint Chief Complaint  Patient presents with  . Emesis  . Headache    HPI Joan [name] is a 39 y.o. female.   39 yo female with a c/o vomiting, decreased appetite and right lower abdominal pain since Saturday evening (3 days ago). Patient states she has not been able to keep anything down. Has tried eating and drinking but ends up vomiting every time. Denies any fevers, chills, urinary symptoms, constipation, melena, hematochezia. States she was tested last week for covid and was negative.    Emesis Associated symptoms: headaches   Headache Associated symptoms: vomiting     Past Medical History:  Diagnosis Date  . Asthma   . Auditory hallucinations    only after anesthesia  . Diabetes mellitus without complication (HCC)    diet controlled  . Diabetes mellitus, type II (HCC)    insulin, jardiance  . Dyspnea    with exertion  . Essential hypertension   . Essential hypertension 11/24/2017  . GERD (gastroesophageal reflux disease)    occasionally-NO MEDS  . History of placement of ear tubes 05/20/2018  . Hyperlipidemia   . MDD (major depressive disorder)   . Miscarriage   . PTSD (post-traumatic stress disorder)   . Tachycardia     Patient Active Problem List   Diagnosis Date Noted  . COVID-19 virus infection 08/16/2019  . Kidney stone 02/11/2019  . Asthma 11/03/2018  . Acute non-recurrent maxillary sinusitis 09/25/2018  . Recurrent sinusitis 05/20/2018  . Recurrent fever of unknown etiology 05/08/2018  . Recurrent URI (upper respiratory infection) 05/08/2018  . Tachycardia 05/04/2018  . Missed period 04/29/2018  . Acute asthma exacerbation 04/10/2018  . BV (bacterial vaginosis) 02/13/2018  . Diabetes mellitus type 2, uncontrolled ([place]) 11/24/2017  . Essential hypertension 11/24/2017  . Hyperlipidemia 11/24/2017  . Major depressive disorder, recurrent,  severe without psychotic features ([place])   . PTSD (post-traumatic stress disorder) 04/11/2015    Past Surgical History:  Procedure Laterality Date  . ABDOMINAL HYSTERECTOMY    . ADENOIDECTOMY    . CHOLECYSTECTOMY  2009  . CYSTOSCOPY N/A 07/31/2018   Procedure: CYSTOSCOPY;  Surgeon: Benjaman Kindler, MD;  Location: [facility];  Service: Gynecology;  Laterality: N/A;  . LAPAROSCOPIC BILATERAL SALPINGECTOMY Bilateral 07/31/2018   Procedure: LAPAROSCOPIC BILATERAL SALPINGECTOMY;  Surgeon: Benjaman Kindler, MD;  Location: [facility];  Service: Gynecology;  Laterality: Bilateral;  . LAPAROSCOPIC HYSTERECTOMY N/A 07/31/2018   Procedure: HYSTERECTOMY TOTAL LAPAROSCOPIC;  Surgeon: Benjaman Kindler, MD;  Location: [facility];  Service: Gynecology;  Laterality: N/A;  . LAPAROSCOPY N/A 06/07/2019   Procedure: LAPAROSCOPY OPERATIVE, WITH PERITONEAL BIOPSIES;  Surgeon: Benjaman Kindler, MD;  Location: [facility];  Service: Gynecology;  Laterality: N/A;  . LYSIS OF ADHESION N/A 07/31/2018   Procedure: LYSIS OF ADHESION;  Surgeon: Benjaman Kindler, MD;  Location: [facility];  Service: Gynecology;  Laterality: N/A;  . OVARY SURGERY Right    cyst removed a while ago  . TONSILLECTOMY    . tubes in ear      OB History   No obstetric history on file.      Home Medications    Prior to Admission medications   Medication Sig Start Date End Date Taking? Authorizing Provider  albuterol (VENTOLIN HFA) 108 (90 Base) MCG/ACT inhaler INHALE 2 PUFFS BY MOUTH EVERY 6 HOURS AS NEEDED FOR SHORTNESS OF BREATH AND WHEEZING  Patient taking differently: Inhale 2 puffs into the lungs every 6 (six) hours as needed for wheezing or shortness of breath. INHALE 2 PUFFS BY MOUTH EVERY 6 HOURS AS NEEDED FOR SHORTNESS OF BREATH AND WHEEZING 10/19/18  Yes Pleas Koch, NP  blood glucose meter kit and supplies KIT Dispense based on patient and insurance preference. Use up three times daily as directed. (FOR ICD-9 250.00, 250.01).  11/24/17  Yes Pleas Koch, NP  citalopram (CELEXA) 20 MG tablet Take 1 tablet (20 mg total) by mouth daily. For anxiety. 09/08/19  Yes Pleas Koch, NP  Continuous Blood Gluc Receiver (FREESTYLE LIBRE 14 DAY READER) DEVI 1 Device by Does not apply route 3 (three) times daily as needed. 09/03/19  Yes Pleas Koch, NP  Continuous Blood Gluc Sensor (FREESTYLE LIBRE 14 DAY SENSOR) MISC 1 Device by Does not apply route 3 (three) times daily as needed. 09/03/19  Yes Pleas Koch, NP  Dulaglutide (TRULICITY) 1.5 NZ/9.7KQ SOPN Inject 1.5 mg into the skin once a week. 09/03/19  Yes Pleas Koch, NP  insulin aspart (NOVOLOG FLEXPEN) 100 UNIT/ML FlexPen Inject 8 Units into the skin 3 (three) times daily with meals. 09/03/19  Yes Pleas Koch, NP  Insulin Glargine (BASAGLAR KWIKPEN) 100 UNIT/ML SOPN Inject 30 units in the morning and 50 units in the evening for diabetes. 09/17/19  Yes Pleas Koch, NP  losartan (COZAAR) 25 MG tablet Take 1 tablet (25 mg total) by mouth daily. For blood pressure. 09/03/19  Yes Pleas Koch, NP  metoprolol tartrate (LOPRESSOR) 25 MG tablet TAKE 1 TABLET(25 MG) BY MOUTH TWICE DAILY 09/27/19  Yes Tysinger, Camelia Eng, PA-C  montelukast (SINGULAIR) 10 MG tablet Take 10 mg by mouth at bedtime.   Yes [provider]  pantoprazole (PROTONIX) 40 MG tablet Take 1 tablet (40 mg total) by mouth daily. 09/08/19  Yes Cook, Jayce G, DO  rosuvastatin (CRESTOR) 10 MG tablet Take 1 tablet (10 mg total) by mouth every evening. For cholesterol. 09/06/19  Yes Pleas Koch, NP  Insulin Pen Needle (PEN NEEDLES) 31G X 6 MM MISC Use with insulin as directed. 11/04/18   Pleas Koch, NP    Family History Family History  Problem Relation Age of Onset  . Asthma Mother   . Diabetes Mother   . Hyperlipidemia Mother   . Hypertension Mother   . Diabetes Father   . Hyperlipidemia Father   . Hypertension Father   . Diabetes Brother   . Depression  Brother   . Alcohol abuse Brother   . Depression Maternal Grandmother   . Stroke Paternal Grandmother   . Breast cancer Neg Hx     Social History Social History   Tobacco Use  . Smoking status: Never Smoker  . Smokeless tobacco: Never Used  Substance Use Topics  . Alcohol use: No  . Drug use: No     Allergies   Ibuprofen, Ciprofloxacin, Metformin and related, Penicillins, Sulfa antibiotics, Other, and Shellfish allergy   Review of Systems Review of Systems  Gastrointestinal: Positive for vomiting.  Neurological: Positive for headaches.     Physical Exam Triage Vital Signs ED Triage Vitals  Enc Vitals Group     BP 10/04/19 1246 123/90     Pulse Rate 10/04/19 1246 89     Resp 10/04/19 1246 18     Temp 10/04/19 1246 97.7 F (36.5 C)     Temp Source 10/04/19 1246 Oral  SpO2 10/04/19 1246 99 %     Weight 10/04/19 1243 291 lb (132 kg)     Height 10/04/19 1243 '5\' 6"'  (1.676 m)     Head Circumference --      Peak Flow --      Pain Score 10/04/19 1243 8     Pain Loc --      Pain Edu? --      Excl. in [place]? --    No data found.  Updated Vital Signs BP 123/90 (BP Location: Right Arm)   Pulse 89   Temp 97.7 F (36.5 C) (Oral)   Resp 18   Ht '5\' 6"'  (1.676 m)   Wt 132 kg   LMP 07/24/2018 (Exact Date) Comment: surgery 07/31/2018  SpO2 99%   BMI 46.97 kg/m   Visual Acuity Right Eye Distance:   Left Eye Distance:   Bilateral Distance:    Right Eye Near:   Left Eye Near:    Bilateral Near:     Physical Exam Vitals and nursing note reviewed.  Constitutional:      General: She is not in acute distress.    Appearance: She is not toxic-appearing or diaphoretic.  Cardiovascular:     Rate and Rhythm: Normal rate.  Pulmonary:     Effort: Pulmonary effort is normal. No respiratory distress.  Abdominal:     General: Bowel sounds are normal. There is no distension.     Palpations: Abdomen is soft. There is no mass.     Tenderness: There is abdominal tenderness  (at McBurney's point). There is no right CVA tenderness, left CVA tenderness, guarding or rebound.     Hernia: No hernia is present.  Neurological:     Mental Status: She is alert.      UC Treatments / Results  Labs (all labs ordered are listed, but only abnormal results are displayed) Labs Reviewed  URINALYSIS, COMPLETE (UACMP) WITH MICROSCOPIC - Abnormal; Notable for the following components:      Result Value   Bacteria, UA FEW (*)    All other components within normal limits  URINE CULTURE    EKG   Radiology No results found.  Procedures Procedures (including critical care time)  Medications Ordered in UC Medications  ondansetron (ZOFRAN-ODT) disintegrating tablet 8 mg (8 mg Oral Given 10/04/19 1311)    Initial Impression / Assessment and Plan / UC Course  I have reviewed the triage vital signs and the nursing notes.  Pertinent labs & imaging results that were available during my care of the patient were reviewed by me and considered in my medical decision making (see chart for details).      Final Clinical Impressions(s) / UC Diagnoses   Final diagnoses:  Non-intractable vomiting with nausea, unspecified vomiting type  Acute right lower quadrant pain  Tenderness at McBurney's point     Discharge Instructions     Recommend patient go to Emergency Department for further evaluation and management    ED Prescriptions    None      1. Lab results and diagnosis reviewed with patient; given zofran 30m odt; recommend patient go to Emergency Department for further evaluation and management. Patient in stable condition, verbalizes understanding and will proceed by private vehicle.   PDMP not reviewed this encounter.   CNorval Gable MD 10/04/19 1[place] MD 10/04/19 1332

## 2019-10-04 NOTE — Discharge Instructions (Signed)
Recommend patient go to Emergency Department for further evaluation and management °

## 2019-10-04 NOTE — ED Provider Notes (Signed)
[facility] Emergency Department Provider Note   ____________________________________________    I have reviewed the triage vital signs and the nursing notes.   HISTORY  Chief Complaint Emesis     HPI Joan [name] is a 39 y.o. female with history of diabetes, hyperlipidemia, hypertension who presents with complaints of nausea and vomiting x2 days with the development of right lower quadrant abdominal pain that started today.  She reports she has had this pain in her right lower quadrant several times in the past but has ignored it.  She denies diarrhea.  Questionable fevers.  Was seen in urgent care and sent to the emergency department for right lower quadrant pain.  Has not take anything for this.  History of surgery as below  Past Medical History:  Diagnosis Date   Asthma    Auditory hallucinations    only after anesthesia   Diabetes mellitus without complication ([place])    diet controlled   Diabetes mellitus, type II ([place])    insulin, jardiance   Dyspnea    with exertion   Essential hypertension    Essential hypertension 11/24/2017   GERD (gastroesophageal reflux disease)    occasionally-NO MEDS   History of placement of ear tubes 05/20/2018   Hyperlipidemia    MDD (major depressive disorder)    Miscarriage    PTSD (post-traumatic stress disorder)    Tachycardia     Patient Active Problem List   Diagnosis Date Noted   COVID-19 virus infection 08/16/2019   Kidney stone 02/11/2019   Asthma 11/03/2018   Acute non-recurrent maxillary sinusitis 09/25/2018   Recurrent sinusitis 05/20/2018   Recurrent fever of unknown etiology 05/08/2018   Recurrent URI (upper respiratory infection) 05/08/2018   Tachycardia 05/04/2018   Missed period 04/29/2018   Acute asthma exacerbation 04/10/2018   BV (bacterial vaginosis) 02/13/2018   Diabetes mellitus type 2, uncontrolled ([place]) 11/24/2017   Essential hypertension  11/24/2017   Hyperlipidemia 11/24/2017   Major depressive disorder, recurrent, severe without psychotic features ([place])    PTSD (post-traumatic stress disorder) 04/11/2015    Past Surgical History:  Procedure Laterality Date   ABDOMINAL HYSTERECTOMY     ADENOIDECTOMY     CHOLECYSTECTOMY  2009   CYSTOSCOPY N/A 07/31/2018   Procedure: CYSTOSCOPY;  Surgeon: Benjaman Kindler, MD;  Location: [facility];  Service: Gynecology;  Laterality: N/A;   LAPAROSCOPIC BILATERAL SALPINGECTOMY Bilateral 07/31/2018   Procedure: LAPAROSCOPIC BILATERAL SALPINGECTOMY;  Surgeon: Benjaman Kindler, MD;  Location: [facility];  Service: Gynecology;  Laterality: Bilateral;   LAPAROSCOPIC HYSTERECTOMY N/A 07/31/2018   Procedure: HYSTERECTOMY TOTAL LAPAROSCOPIC;  Surgeon: Benjaman Kindler, MD;  Location: [facility];  Service: Gynecology;  Laterality: N/A;   LAPAROSCOPY N/A 06/07/2019   Procedure: LAPAROSCOPY OPERATIVE, WITH PERITONEAL BIOPSIES;  Surgeon: Benjaman Kindler, MD;  Location: [facility];  Service: Gynecology;  Laterality: N/A;   LYSIS OF ADHESION N/A 07/31/2018   Procedure: LYSIS OF ADHESION;  Surgeon: Benjaman Kindler, MD;  Location: [facility];  Service: Gynecology;  Laterality: N/A;   OVARY SURGERY Right    cyst removed a while ago   TONSILLECTOMY     tubes in ear      Prior to Admission medications   Medication Sig Start Date End Date Taking? Authorizing Provider  albuterol (VENTOLIN HFA) 108 (90 Base) MCG/ACT inhaler INHALE 2 PUFFS BY MOUTH EVERY 6 HOURS AS NEEDED FOR SHORTNESS OF BREATH AND WHEEZING Patient taking differently: Inhale 2 puffs into the lungs every 6 (six) hours as  needed for wheezing or shortness of breath. INHALE 2 PUFFS BY MOUTH EVERY 6 HOURS AS NEEDED FOR SHORTNESS OF BREATH AND WHEEZING 10/19/18   Pleas Koch, NP  blood glucose meter kit and supplies KIT Dispense based on patient and insurance preference. Use up three times daily as directed. (FOR ICD-9 250.00, 250.01).  11/24/17   Pleas Koch, NP  citalopram (CELEXA) 20 MG tablet Take 1 tablet (20 mg total) by mouth daily. For anxiety. 09/08/19   Pleas Koch, NP  Continuous Blood Gluc Receiver (FREESTYLE LIBRE 14 DAY READER) DEVI 1 Device by Does not apply route 3 (three) times daily as needed. 09/03/19   Pleas Koch, NP  Continuous Blood Gluc Sensor (FREESTYLE LIBRE 14 DAY SENSOR) MISC 1 Device by Does not apply route 3 (three) times daily as needed. 09/03/19   Pleas Koch, NP  Dulaglutide (TRULICITY) 1.5 QI/2.9NL SOPN Inject 1.5 mg into the skin once a week. 09/03/19   Pleas Koch, NP  insulin aspart (NOVOLOG FLEXPEN) 100 UNIT/ML FlexPen Inject 8 Units into the skin 3 (three) times daily with meals. 09/03/19   Pleas Koch, NP  Insulin Glargine (BASAGLAR KWIKPEN) 100 UNIT/ML SOPN Inject 30 units in the morning and 50 units in the evening for diabetes. 09/17/19   Pleas Koch, NP  Insulin Pen Needle (PEN NEEDLES) 31G X 6 MM MISC Use with insulin as directed. 11/04/18   Pleas Koch, NP  losartan (COZAAR) 25 MG tablet Take 1 tablet (25 mg total) by mouth daily. For blood pressure. 09/03/19   Pleas Koch, NP  metoprolol tartrate (LOPRESSOR) 25 MG tablet TAKE 1 TABLET(25 MG) BY MOUTH TWICE DAILY 09/27/19   Tysinger, Camelia Eng, PA-C  montelukast (SINGULAIR) 10 MG tablet Take 10 mg by mouth at bedtime.    [provider]  ondansetron (ZOFRAN ODT) 4 MG disintegrating tablet Take 1 tablet (4 mg total) by mouth every 8 (eight) hours as needed. 10/04/19   Lavonia Drafts, MD  pantoprazole (PROTONIX) 40 MG tablet Take 1 tablet (40 mg total) by mouth daily. 09/08/19   Coral Spikes, DO  rosuvastatin (CRESTOR) 10 MG tablet Take 1 tablet (10 mg total) by mouth every evening. For cholesterol. 09/06/19   Pleas Koch, NP     Allergies Ibuprofen, Ciprofloxacin, Metformin and related, Penicillins, Sulfa antibiotics, Other, and Shellfish allergy  Family History  Problem  Relation Age of Onset   Asthma Mother    Diabetes Mother    Hyperlipidemia Mother    Hypertension Mother    Diabetes Father    Hyperlipidemia Father    Hypertension Father    Diabetes Brother    Depression Brother    Alcohol abuse Brother    Depression Maternal Grandmother    Stroke Paternal Grandmother    Breast cancer Neg Hx     Social History Social History   Tobacco Use   Smoking status: Never Smoker   Smokeless tobacco: Never Used  Substance Use Topics   Alcohol use: No   Drug use: No    Review of Systems  Constitutional: No fever/chills Eyes: No visual changes.  ENT: No sore throat. Cardiovascular: Denies chest pain. Respiratory: Denies shortness of breath. Gastrointestinal: As above Genitourinary: Negative for dysuria. Musculoskeletal: Negative for back pain. Skin: Negative for rash. Neurological: Negative for headaches    ____________________________________________   PHYSICAL EXAM:  VITAL SIGNS: ED Triage Vitals  Enc Vitals Group     BP 10/04/19 1417 125/78  Pulse Rate 10/04/19 1417 83     Resp 10/04/19 1417 16     Temp 10/04/19 1417 98.6 F (37 C)     Temp Source 10/04/19 1417 Oral     SpO2 10/04/19 1417 99 %     Weight 10/04/19 1415 132 kg (291 lb 0.1 oz)     Height 10/04/19 1415 1.676 m (_0 )     Head Circumference --      Peak Flow --      Pain Score 10/04/19 1414 9     Pain Loc --      Pain Edu? --      Excl. in [place]? --     Constitutional: Alert and oriented.   Nose: No congestion/rhinnorhea. Mouth/Throat: Mucous membranes are moist.    Cardiovascular: Normal rate, regular rhythm. Grossly normal heart sounds.  Good peripheral circulation. Respiratory: Normal respiratory effort.  No retractions. Lungs CTAB. Gastrointestinal: Mild tenderness right lower quadrant. No distention.  No CVA tenderness.  Musculoskeletal:   Warm and well perfused Neurologic:  Normal speech and language. No gross focal neurologic  deficits are appreciated.  Skin:  Skin is warm, dry and intact. No rash noted. Psychiatric: Mood and affect are normal. Speech and behavior are normal.  ____________________________________________   LABS (all labs ordered are listed, but only abnormal results are displayed)  Labs Reviewed  COMPREHENSIVE METABOLIC PANEL - Abnormal; Notable for the following components:      Result Value   Glucose, Bld 187 (*)    All other components within normal limits  LIPASE, BLOOD  CBC  URINALYSIS, COMPLETE (UACMP) WITH MICROSCOPIC  POC URINE PREG, ED   ____________________________________________  EKG  None ____________________________________________  RADIOLOGY  CT abdomen pelvis ____________________________________________   PROCEDURES  Procedure(s) performed: No  Procedures   Critical Care performed: No ____________________________________________   INITIAL IMPRESSION / ASSESSMENT AND PLAN / ED COURSE  Pertinent labs & imaging results that were available during my care of the patient were reviewed by me and considered in my medical decision making (see chart for details).  Patient with reassuring labs, mild tenderness right lower quadrant will obtain CT abdomen pelvis to evaluate for possible appendicitis.  Will give IV Zofran, IV fluids.  CT scan is reassuring patient is feeling better after Zofran and fluids.  Appropriate for discharge at this time with nausea medication, outpatient follow-up, return precautions discussed    ____________________________________________   FINAL CLINICAL IMPRESSION(S) / ED DIAGNOSES  Final diagnoses:  Right lower quadrant abdominal pain  Nausea and vomiting, intractability of vomiting not specified, unspecified vomiting type        Note:  This document was prepared using Dragon voice recognition software and may include unintentional dictation errors.   Lavonia Drafts, MD 10/04/19 [phone]

## 2019-10-05 ENCOUNTER — Encounter: Payer: Self-pay | Admitting: Family Medicine

## 2019-10-05 ENCOUNTER — Ambulatory Visit (INDEPENDENT_AMBULATORY_CARE_PROVIDER_SITE_OTHER): Payer: 59 | Admitting: Family Medicine

## 2019-10-05 ENCOUNTER — Encounter: Payer: Self-pay | Admitting: Gastroenterology

## 2019-10-05 DIAGNOSIS — R1031 Right lower quadrant pain: Secondary | ICD-10-CM | POA: Diagnosis not present

## 2019-10-05 DIAGNOSIS — R112 Nausea with vomiting, unspecified: Secondary | ICD-10-CM

## 2019-10-05 HISTORY — DX: Nausea with vomiting, unspecified: R11.2

## 2019-10-05 LAB — URINE CULTURE: Special Requests: NORMAL

## 2019-10-05 MED ORDER — PROMETHAZINE HCL 25 MG PO TABS: 25.0000 mg | ORAL_TABLET | Freq: Three times a day (TID) | ORAL | 0 refills | Status: DC | PRN

## 2019-10-05 NOTE — Patient Instructions (Signed)
Try phenergan instead of zofran for nausea  If signs/symptom of dehydration-go back to the ER  Try to sip fluids  I placed an urgent referral to GI for-the office will call you

## 2019-10-05 NOTE — Assessment & Plan Note (Signed)
With RLQ pain - seen in ER and tx with fluids and zofran yesterday (pt states no improvement) Still vomiting- px phenergan to see if this works better Reviewed hospital records, lab results and studies in detail   Reassuring labs and CT   (no s/s of appendicitis)  Unsure of cause  Urgent ref to GI made  inst to go to ER again if worse Also if s/s of dehydration (reviewed)

## 2019-10-05 NOTE — Progress Notes (Signed)
Virtual Visit via Video Note  I connected with Joan [name] on 10/05/19 at  9:30 AM EST by a video enabled telemedicine application and verified that I am speaking with the correct person using two identifiers.  Location: Patient: home Provider: office    I discussed the limitations of evaluation and management by telemedicine and the availability of in person appointments. The patient expressed understanding and agreed to proceed.  Parties involved in encounter  Patient: Joan [name]  Provider:  Loura Pardon MD    History of Present Illness: Pt presents for abd pain and emesis   Was seen in ED yesterday for RLQ abd pain with vomiting and headache   A/P: Patient with reassuring labs, mild tenderness right lower quadrant will obtain CT abdomen pelvis to evaluate for possible appendicitis.  Will give IV Zofran, IV fluids.  CT scan is reassuring patient is feeling better after Zofran and fluids.  Appropriate for discharge at this time with nausea medication, outpatient follow-up, return precautions discussed  Pt states she was not in fact feeling better  zofran is not helping at all   Pain is sharp and stabbing in nature  This am it has been constant  Pain is 9/10   Pain will radiate to the L side and back  Movement and jarring makes it worse  No diarrhea  No blood in her stool   Has had low grade temp  Last covid test was last week [facility]- neg [facility] neg on 2/12   No blood in urine  A little frequency   Has not had appendix removed Has had gallbladder removed   DG Chest 2 View  Result Date: 09/08/2019 CLINICAL DATA:  Chest pain.  COVID-19 positive in January 2021. EXAM: CHEST - 2 VIEW COMPARISON:  08/21/2019 FINDINGS: The heart size and mediastinal contours are within normal limits. Both lungs are clear. The visualized skeletal structures are unremarkable. IMPRESSION: Normal exam. Electronically Signed   By: Lorriane Shire M.D.   On: 09/08/2019 11:17   CT  ABDOMEN PELVIS W CONTRAST  Result Date: 10/04/2019 CLINICAL DATA:  39 year old female with right lower quadrant abdominal pain. EXAM: CT ABDOMEN AND PELVIS WITH CONTRAST TECHNIQUE: Multidetector CT imaging of the abdomen and pelvis was performed using the standard protocol following bolus administration of intravenous contrast. CONTRAST:  160m OMNIPAQUE IOHEXOL 300 MG/ML  SOLN COMPARISON:  CT abdomen pelvis dated 05/18/2019. FINDINGS: Lower chest: The visualized lung bases are clear. No intra-abdominal free air or free fluid. Hepatobiliary: There is diffuse fatty infiltration of the liver. There is a faint 1 x 3 cm focal area of decreased enhancement in the left lobe of the liver (axial series 2, image 31 and coronal series 5, image 24) which is not characterized but may represent a focal fatty infiltration although a small liver laceration is not excluded. Clinical correlation is recommended. There is no intrahepatic biliary ductal dilatation. Cholecystectomy. Pancreas: Unremarkable. No pancreatic ductal dilatation or surrounding inflammatory changes. Spleen: Normal in size without focal abnormality. Adrenals/Urinary Tract: Mild right adrenal nodularity. The left adrenal gland is unremarkable. There is no hydronephrosis on either side. The visualized ureters and urinary bladder appear unremarkable. Stomach/Bowel: Minimal haziness of the wall of the duodenal C-loop, likely artifactual. Clinical correlation is recommended to exclude mild enteritis. There is no bowel obstruction. The appendix is normal. Vascular/Lymphatic: The abdominal aorta and IVC are unremarkable. No portal venous gas. There is no adenopathy. Reproductive: Hysterectomy. No adnexal masses. Other: None Musculoskeletal: No acute  or significant osseous findings. IMPRESSION: 1. Minimal haziness of the wall of the duodenal C-loop, likely artifactual. Clinical correlation is recommended to exclude mild enteritis. No bowel obstruction. Normal appendix.  2. Fatty liver. Faint focal area of decreased enhancement in the left lobe of the liver, not characterized, possibly focal fatty infiltration. A small liver laceration is not excluded. Clinical correlation is recommended. Electronically Signed   By: Anner Crete M.D.   On: 10/04/2019 16:20   Results for orders placed or performed during the hospital encounter of 10/04/19  Lipase, blood  Result Value Ref Range   Lipase 25 11 - 51 U/L  Comprehensive metabolic panel  Result Value Ref Range   Sodium 136 135 - 145 mmol/L   Potassium 4.6 3.5 - 5.1 mmol/L   Chloride 98 98 - 111 mmol/L   CO2 28 22 - 32 mmol/L   Glucose, Bld 187 (H) 70 - 99 mg/dL   BUN 12 6 - 20 mg/dL   Creatinine, Ser 0.66 0.44 - 1.00 mg/dL   Calcium 9.2 8.9 - 10.3 mg/dL   Total Protein 7.4 6.5 - 8.1 g/dL   Albumin 3.9 3.5 - 5.0 g/dL   AST 21 15 - 41 U/L   ALT 31 0 - 44 U/L   Alkaline Phosphatase 105 38 - 126 U/L   Total Bilirubin 0.4 0.3 - 1.2 mg/dL   GFR calc non Af Amer >60 >60 mL/min   GFR calc Af Amer >60 >60 mL/min   Anion gap 10 5 - 15  CBC  Result Value Ref Range   WBC 7.1 4.0 - 10.5 K/uL   RBC 4.96 3.87 - 5.11 MIL/uL   Hemoglobin 13.7 12.0 - 15.0 g/dL   HCT 42.0 36.0 - 46.0 %   MCV 84.7 80.0 - 100.0 fL   MCH 27.6 26.0 - 34.0 pg   MCHC 32.6 30.0 - 36.0 g/dL   RDW 13.4 11.5 - 15.5 %   Platelets 329 150 - 400 K/uL   nRBC 0.0 0.0 - 0.2 %     she has had this pain before - never found anything  This is much more severe than usual  It hurts to press on that spot  Hurts to move around   Has not kept anything down   Father had h/o colon polyps  No other issues in the family   She has never seen GI   DM Lab Results  Component Value Date   HGBA1C 11.7 (A) 09/03/2019  she is using insulin- is doing better  No h/o gastroparesis   None of her medicines are new except crestor   No vaginal d/c or chance of STD  Had hysterectomy-not pregnant    Patient Active Problem List   Diagnosis Date Noted   . Nausea & vomiting 10/05/2019  . COVID-19 virus infection 08/16/2019  . Kidney stone 02/11/2019  . Asthma 11/03/2018  . Acute non-recurrent maxillary sinusitis 09/25/2018  . Recurrent sinusitis 05/20/2018  . Recurrent fever of unknown etiology 05/08/2018  . Recurrent URI (upper respiratory infection) 05/08/2018  . Tachycardia 05/04/2018  . Missed period 04/29/2018  . Acute asthma exacerbation 04/10/2018  . Right lower quadrant abdominal pain 02/13/2018  . BV (bacterial vaginosis) 02/13/2018  . Diabetes mellitus type 2, uncontrolled ([place]) 11/24/2017  . Essential hypertension 11/24/2017  . Hyperlipidemia 11/24/2017  . Major depressive disorder, recurrent, severe without psychotic features ([place])   . PTSD (post-traumatic stress disorder) 04/11/2015   Past Medical History:  Diagnosis Date  .  Asthma   . Auditory hallucinations    only after anesthesia  . Diabetes mellitus without complication (HCC)    diet controlled  . Diabetes mellitus, type II (HCC)    insulin, jardiance  . Dyspnea    with exertion  . Essential hypertension   . Essential hypertension 11/24/2017  . GERD (gastroesophageal reflux disease)    occasionally-NO MEDS  . History of placement of ear tubes 05/20/2018  . Hyperlipidemia   . MDD (major depressive disorder)   . Miscarriage   . PTSD (post-traumatic stress disorder)   . Tachycardia    Past Surgical History:  Procedure Laterality Date  . ABDOMINAL HYSTERECTOMY    . ADENOIDECTOMY    . CHOLECYSTECTOMY  2009  . CYSTOSCOPY N/A 07/31/2018   Procedure: CYSTOSCOPY;  Surgeon: Benjaman Kindler, MD;  Location: [facility];  Service: Gynecology;  Laterality: N/A;  . LAPAROSCOPIC BILATERAL SALPINGECTOMY Bilateral 07/31/2018   Procedure: LAPAROSCOPIC BILATERAL SALPINGECTOMY;  Surgeon: Benjaman Kindler, MD;  Location: [facility];  Service: Gynecology;  Laterality: Bilateral;  . LAPAROSCOPIC HYSTERECTOMY N/A 07/31/2018   Procedure: HYSTERECTOMY TOTAL LAPAROSCOPIC;   Surgeon: Benjaman Kindler, MD;  Location: [facility];  Service: Gynecology;  Laterality: N/A;  . LAPAROSCOPY N/A 06/07/2019   Procedure: LAPAROSCOPY OPERATIVE, WITH PERITONEAL BIOPSIES;  Surgeon: Benjaman Kindler, MD;  Location: [facility];  Service: Gynecology;  Laterality: N/A;  . LYSIS OF ADHESION N/A 07/31/2018   Procedure: LYSIS OF ADHESION;  Surgeon: Benjaman Kindler, MD;  Location: [facility];  Service: Gynecology;  Laterality: N/A;  . OVARY SURGERY Right    cyst removed a while ago  . TONSILLECTOMY    . tubes in ear     Social History   Tobacco Use  . Smoking status: Never Smoker  . Smokeless tobacco: Never Used  Substance Use Topics  . Alcohol use: No  . Drug use: No   Family History  Problem Relation Age of Onset  . Asthma Mother   . Diabetes Mother   . Hyperlipidemia Mother   . Hypertension Mother   . Diabetes Father   . Hyperlipidemia Father   . Hypertension Father   . Diabetes Brother   . Depression Brother   . Alcohol abuse Brother   . Depression Maternal Grandmother   . Stroke Paternal Grandmother   . Breast cancer Neg Hx    Allergies  Allergen Reactions  . Ibuprofen Swelling    Facial   . Ciprofloxacin Hives and Rash  . Metformin And Related Other (See Comments)    Elevated Lactic Acid  . Penicillins Hives and Rash    Has patient had a PCN reaction causing immediate rash, facial/tongue/throat swelling, SOB or lightheadedness with hypotension: No Has patient had a PCN reaction causing severe rash involving mucus membranes or skin necrosis: No Has patient had a PCN reaction that required hospitalization: No Has patient had a PCN reaction occurring within the last 10 years: No If all of the above answers are "NO", then may proceed with Cephalosporin use. THE PATIENT IS ABLE TO TOLERATE CEPHALOSPORINS WITHOUT DIFFIC  . Sulfa Antibiotics Hives and Rash  . Other     ALL NUTS-SCRATCHY THROAT  . Shellfish Allergy Other (See Comments)    ALL SEAFOOD-SCRATCHY  THROAT   Current Outpatient Medications on File Prior to Visit  Medication Sig Dispense Refill  . albuterol (VENTOLIN HFA) 108 (90 Base) MCG/ACT inhaler INHALE 2 PUFFS BY MOUTH EVERY 6 HOURS AS NEEDED FOR SHORTNESS OF BREATH AND WHEEZING (Patient taking differently: Inhale 2  puffs into the lungs every 6 (six) hours as needed for wheezing or shortness of breath. INHALE 2 PUFFS BY MOUTH EVERY 6 HOURS AS NEEDED FOR SHORTNESS OF BREATH AND WHEEZING) 18 g 0  . blood glucose meter kit and supplies KIT Dispense based on patient and insurance preference. Use up three times daily as directed. (FOR ICD-9 250.00, 250.01). 1 each 0  . citalopram (CELEXA) 20 MG tablet Take 1 tablet (20 mg total) by mouth daily. For anxiety. 90 tablet 0  . Continuous Blood Gluc Receiver (FREESTYLE LIBRE 14 DAY READER) DEVI 1 Device by Does not apply route 3 (three) times daily as needed. 1 each 0  . Continuous Blood Gluc Sensor (FREESTYLE LIBRE 14 DAY SENSOR) MISC 1 Device by Does not apply route 3 (three) times daily as needed. 2 each 2  . Dulaglutide (TRULICITY) 1.5 VZ/5.6LO SOPN Inject 1.5 mg into the skin once a week. 2 mL 2  . insulin aspart (NOVOLOG FLEXPEN) 100 UNIT/ML FlexPen Inject 8 Units into the skin 3 (three) times daily with meals. 15 mL 3  . Insulin Glargine (BASAGLAR KWIKPEN) 100 UNIT/ML SOPN Inject 30 units in the morning and 50 units in the evening for diabetes. 30 mL 5  . Insulin Pen Needle (PEN NEEDLES) 31G X 6 MM MISC Use with insulin as directed. 100 each 2  . losartan (COZAAR) 25 MG tablet Take 1 tablet (25 mg total) by mouth daily. For blood pressure. 90 tablet 0  . metoprolol tartrate (LOPRESSOR) 25 MG tablet TAKE 1 TABLET(25 MG) BY MOUTH TWICE DAILY 60 tablet 0  . montelukast (SINGULAIR) 10 MG tablet Take 10 mg by mouth at bedtime.    . ondansetron (ZOFRAN ODT) 4 MG disintegrating tablet Take 1 tablet (4 mg total) by mouth every 8 (eight) hours as needed. 20 tablet 0  . rosuvastatin (CRESTOR) 10 MG  tablet Take 1 tablet (10 mg total) by mouth every evening. For cholesterol. 90 tablet 3   No current facility-administered medications on file prior to visit.    Observations/Objective: Patient appears well, in no distress (but appears fatigue and voices abd pain) Weight is baseline  No facial swelling or asymmetry Normal voice-not hoarse and no slurred speech No obvious tremor or mobility impairment Moving neck and UEs normally Able to hear the call well  No cough or shortness of breath during interview  Talkative and mentally sharp with no cognitive changes No skin changes on face or neck , no rash or pallor Affect is normal    Assessment and Plan: Problem List Items Addressed This Visit      Digestive   Nausea & vomiting    With RLQ pain - seen in ER and tx with fluids and zofran yesterday (pt states no improvement) Still vomiting- px phenergan to see if this works better Reviewed hospital records, lab results and studies in detail   Reassuring labs and CT   (no s/s of appendicitis)  Unsure of cause  Urgent ref to GI made  inst to go to ER again if worse Also if s/s of dehydration (reviewed)      Relevant Orders   Ambulatory referral to Gastroenterology     Other   Right lower quadrant abdominal pain - Primary    With n/v (see A and P for that) Reviewed hospital records, lab results and studies in detail   Reassuring CT scan/ labs  Px phenergan Urgent GI ref made       Relevant Orders  Ambulatory referral to Gastroenterology       Follow Up Instructions: Try phenergan instead of zofran for nausea  If signs/symptom of dehydration-go back to the ER  Try to sip fluids  I placed an urgent referral to GI for-the office will call you     I discussed the assessment and treatment plan with the patient. The patient was provided an opportunity to ask questions and all were answered. The patient agreed with the plan and demonstrated an understanding of the  instructions.   The patient was advised to call back or seek an in-person evaluation if the symptoms worsen or if the condition fails to improve as anticipated.     Loura Pardon, MD

## 2019-10-05 NOTE — Assessment & Plan Note (Signed)
With n/v (see A and P for that) Reviewed hospital records, lab results and studies in detail   Reassuring CT scan/ labs  Px phenergan Urgent GI ref made

## 2019-10-08 ENCOUNTER — Ambulatory Visit: Payer: 59 | Admitting: Primary Care

## 2019-10-10 NOTE — Progress Notes (Deleted)
10/10/2019 BIRDELLA SIPPEL [id number] 1981/01/29   CHIEF COMPLAINT:   HISTORY OF PRESENT ILLNESS:  Joan [name]. [name] is a 39 year old female with a past medical history of asthma, hypertension, DM II and GERD. S/P laparoscopic hysterectomy 2019  and cholecystectomy 2009. Lysis of adhesions.  She presented to [facility] ED on 10/04/2019 with nausea and vomiting x 2 days with RLQ abdominal pain x 1 day.  She received IV fluids and Phenergan. Her symptoms improved so she was discharged home.   Abdominal/pelvic CT with contrast 10/04/2019: 1. Minimal haziness of the wall of the duodenal C-loop, likely artifactual. Clinical correlation is recommended to exclude mild enteritis. No bowel obstruction. Normal appendix. 2. Fatty liver. Faint focal area of decreased enhancement in the left lobe of the liver, not characterized, possibly focal fatty infiltration. A small liver laceration is not excluded. Clinical correlation is recommended.  CBC Latest Ref Rng & Units 10/04/2019 09/20/2019 09/08/2019  WBC 4.0 - 10.5 K/uL 7.1 6.3 6.6  Hemoglobin 12.0 - 15.0 g/dL 13.7 14.0 14.4  Hematocrit 36.0 - 46.0 % 42.0 42.6 42.9  Platelets 150 - 400 K/uL 329 282.0 330   CMP Latest Ref Rng & Units 10/04/2019 09/08/2019 09/03/2019  Glucose 70 - 99 mg/dL 187(H) 246(H) 247(H)  BUN 6 - 20 mg/dL '12 20 12  ' Creatinine 0.44 - 1.00 mg/dL 0.66 0.71 0.68  Sodium 135 - 145 mmol/L 136 131(L) 135  Potassium 3.5 - 5.1 mmol/L 4.6 4.0 4.7  Chloride 98 - 111 mmol/L 98 96(L) 99  CO2 22 - 32 mmol/L '28 24 28  ' Calcium 8.9 - 10.3 mg/dL 9.2 9.2 9.4  Total Protein 6.5 - 8.1 g/dL 7.4 7.8 -  Total Bilirubin 0.3 - 1.2 mg/dL 0.4 0.9 -  Alkaline Phos 38 - 126 U/L 105 130(H) -  AST 15 - 41 U/L 21 24 -  ALT 0 - 44 U/L 31 40 -    Past Medical History:  Diagnosis Date  . Asthma   . Auditory hallucinations    only after anesthesia  . Diabetes mellitus without complication (HCC)    diet controlled  . Diabetes  mellitus, type II (HCC)    insulin, jardiance  . Dyspnea    with exertion  . Essential hypertension   . Essential hypertension 11/24/2017  . GERD (gastroesophageal reflux disease)    occasionally-NO MEDS  . History of placement of ear tubes 05/20/2018  . Hyperlipidemia   . MDD (major depressive disorder)   . Miscarriage   . PTSD (post-traumatic stress disorder)   . Tachycardia    Past Surgical History:  Procedure Laterality Date  . ABDOMINAL HYSTERECTOMY    . ADENOIDECTOMY    . CHOLECYSTECTOMY  2009  . CYSTOSCOPY N/A 07/31/2018   Procedure: CYSTOSCOPY;  Surgeon: Benjaman Kindler, MD;  Location: [facility];  Service: Gynecology;  Laterality: N/A;  . LAPAROSCOPIC BILATERAL SALPINGECTOMY Bilateral 07/31/2018   Procedure: LAPAROSCOPIC BILATERAL SALPINGECTOMY;  Surgeon: Benjaman Kindler, MD;  Location: [facility];  Service: Gynecology;  Laterality: Bilateral;  . LAPAROSCOPIC HYSTERECTOMY N/A 07/31/2018   Procedure: HYSTERECTOMY TOTAL LAPAROSCOPIC;  Surgeon: Benjaman Kindler, MD;  Location: [facility];  Service: Gynecology;  Laterality: N/A;  . LAPAROSCOPY N/A 06/07/2019   Procedure: LAPAROSCOPY OPERATIVE, WITH PERITONEAL BIOPSIES;  Surgeon: Benjaman Kindler, MD;  Location: [facility];  Service: Gynecology;  Laterality: N/A;  . LYSIS OF ADHESION N/A 07/31/2018   Procedure: LYSIS OF ADHESION;  Surgeon: Benjaman Kindler, MD;  Location: [facility];  Service: Gynecology;  Laterality: N/A;  . OVARY SURGERY Right    cyst removed a while ago  . TONSILLECTOMY    . tubes in ear      reports that she has never smoked. She has never used smokeless tobacco. She reports that she does not drink alcohol or use drugs. family history includes Alcohol abuse in her brother; Asthma in her mother; Depression in her brother and maternal grandmother; Diabetes in her brother, father, and mother; Hyperlipidemia in her father and mother; Hypertension in her father and mother; Stroke in her paternal grandmother. Allergies    Allergen Reactions  . Ibuprofen Swelling    Facial   . Ciprofloxacin Hives and Rash  . Metformin And Related Other (See Comments)    Elevated Lactic Acid  . Penicillins Hives and Rash    Has patient had a PCN reaction causing immediate rash, facial/tongue/throat swelling, SOB or lightheadedness with hypotension: No Has patient had a PCN reaction causing severe rash involving mucus membranes or skin necrosis: No Has patient had a PCN reaction that required hospitalization: No Has patient had a PCN reaction occurring within the last 10 years: No If all of the above answers are "NO", then may proceed with Cephalosporin use. THE PATIENT IS ABLE TO TOLERATE CEPHALOSPORINS WITHOUT DIFFIC  . Sulfa Antibiotics Hives and Rash  . Other     ALL NUTS-SCRATCHY THROAT  . Shellfish Allergy Other (See Comments)    ALL SEAFOOD-SCRATCHY THROAT      Outpatient Encounter Medications as of 10/12/2019  Medication Sig  . albuterol (VENTOLIN HFA) 108 (90 Base) MCG/ACT inhaler INHALE 2 PUFFS BY MOUTH EVERY 6 HOURS AS NEEDED FOR SHORTNESS OF BREATH AND WHEEZING (Patient taking differently: Inhale 2 puffs into the lungs every 6 (six) hours as needed for wheezing or shortness of breath. INHALE 2 PUFFS BY MOUTH EVERY 6 HOURS AS NEEDED FOR SHORTNESS OF BREATH AND WHEEZING)  . blood glucose meter kit and supplies KIT Dispense based on patient and insurance preference. Use up three times daily as directed. (FOR ICD-9 250.00, 250.01).  . citalopram (CELEXA) 20 MG tablet Take 1 tablet (20 mg total) by mouth daily. For anxiety.  . Continuous Blood Gluc Receiver (FREESTYLE LIBRE 14 DAY READER) DEVI 1 Device by Does not apply route 3 (three) times daily as needed.  . Continuous Blood Gluc Sensor (FREESTYLE LIBRE 14 DAY SENSOR) MISC 1 Device by Does not apply route 3 (three) times daily as needed.  . Dulaglutide (TRULICITY) 1.5 RK/2.7CW SOPN Inject 1.5 mg into the skin once a week.  . insulin aspart (NOVOLOG FLEXPEN) 100  UNIT/ML FlexPen Inject 8 Units into the skin 3 (three) times daily with meals.  . Insulin Glargine (BASAGLAR KWIKPEN) 100 UNIT/ML SOPN Inject 30 units in the morning and 50 units in the evening for diabetes.  . Insulin Pen Needle (PEN NEEDLES) 31G X 6 MM MISC Use with insulin as directed.  Marland Kitchen losartan (COZAAR) 25 MG tablet Take 1 tablet (25 mg total) by mouth daily. For blood pressure.  . metoprolol tartrate (LOPRESSOR) 25 MG tablet TAKE 1 TABLET(25 MG) BY MOUTH TWICE DAILY  . montelukast (SINGULAIR) 10 MG tablet Take 10 mg by mouth at bedtime.  . ondansetron (ZOFRAN ODT) 4 MG disintegrating tablet Take 1 tablet (4 mg total) by mouth every 8 (eight) hours as needed.  . promethazine (PHENERGAN) 25 MG tablet Take 1 tablet (25 mg total) by mouth every 8 (eight) hours as needed for nausea or vomiting. Caution  of sedation  . rosuvastatin (CRESTOR) 10 MG tablet Take 1 tablet (10 mg total) by mouth every evening. For cholesterol.   No facility-administered encounter medications on file as of 10/12/2019.     REVIEW OF SYSTEMS: All other systems reviewed and negative except where noted in the History of Present Illness.   PHYSICAL EXAM: LMP 07/24/2018 (Exact Date) Comment: surgery 07/31/2018 General: Well developed ... in no acute distress. Head: Normocephalic and atraumatic. Eyes:  Sclerae non-icteric, conjunctive pink. Ears: Normal auditory acuity. Mouth: Dentition intact. No ulcers or lesions.  Neck: Supple, no lymphadenopathy or thyromegaly.  Lungs: Clear bilaterally to auscultation without wheezes, crackles or rhonchi. Heart: Regular rate and rhythm. No murmur, rub or gallop appreciated.  Abdomen: Soft, nontender, non distended. No masses. No hepatosplenomegaly. Normoactive bowel sounds x 4 quadrants.  Rectal:  Musculoskeletal: Symmetrical with no gross deformities. Skin: Warm and dry. No rash or lesions on visible extremities. Extremities: No edema. Neurological: Alert oriented x 4, no  focal deficits.  Psychological:  Alert and cooperative. Normal mood and affect.  ASSESSMENT AND PLAN:    CC:  Tower, Wynelle Fanny, MD

## 2019-10-12 ENCOUNTER — Ambulatory Visit: Payer: 59 | Admitting: Nurse Practitioner

## 2019-10-12 ENCOUNTER — Ambulatory Visit: Payer: 59 | Admitting: Gastroenterology

## 2019-11-06 IMAGING — CR DG CHEST 2V
1 series · 2 of 2 positions shown · non-contrast
Comparison: PA and lateral chest 09/16/2004.

CLINICAL DATA: Flu like symptoms with fever.

EXAM:
CHEST - 2 VIEW

[Series 1: dg chest 2 view · 0.14mm/px · 2 of 2 slices shown]
[im 1/2]
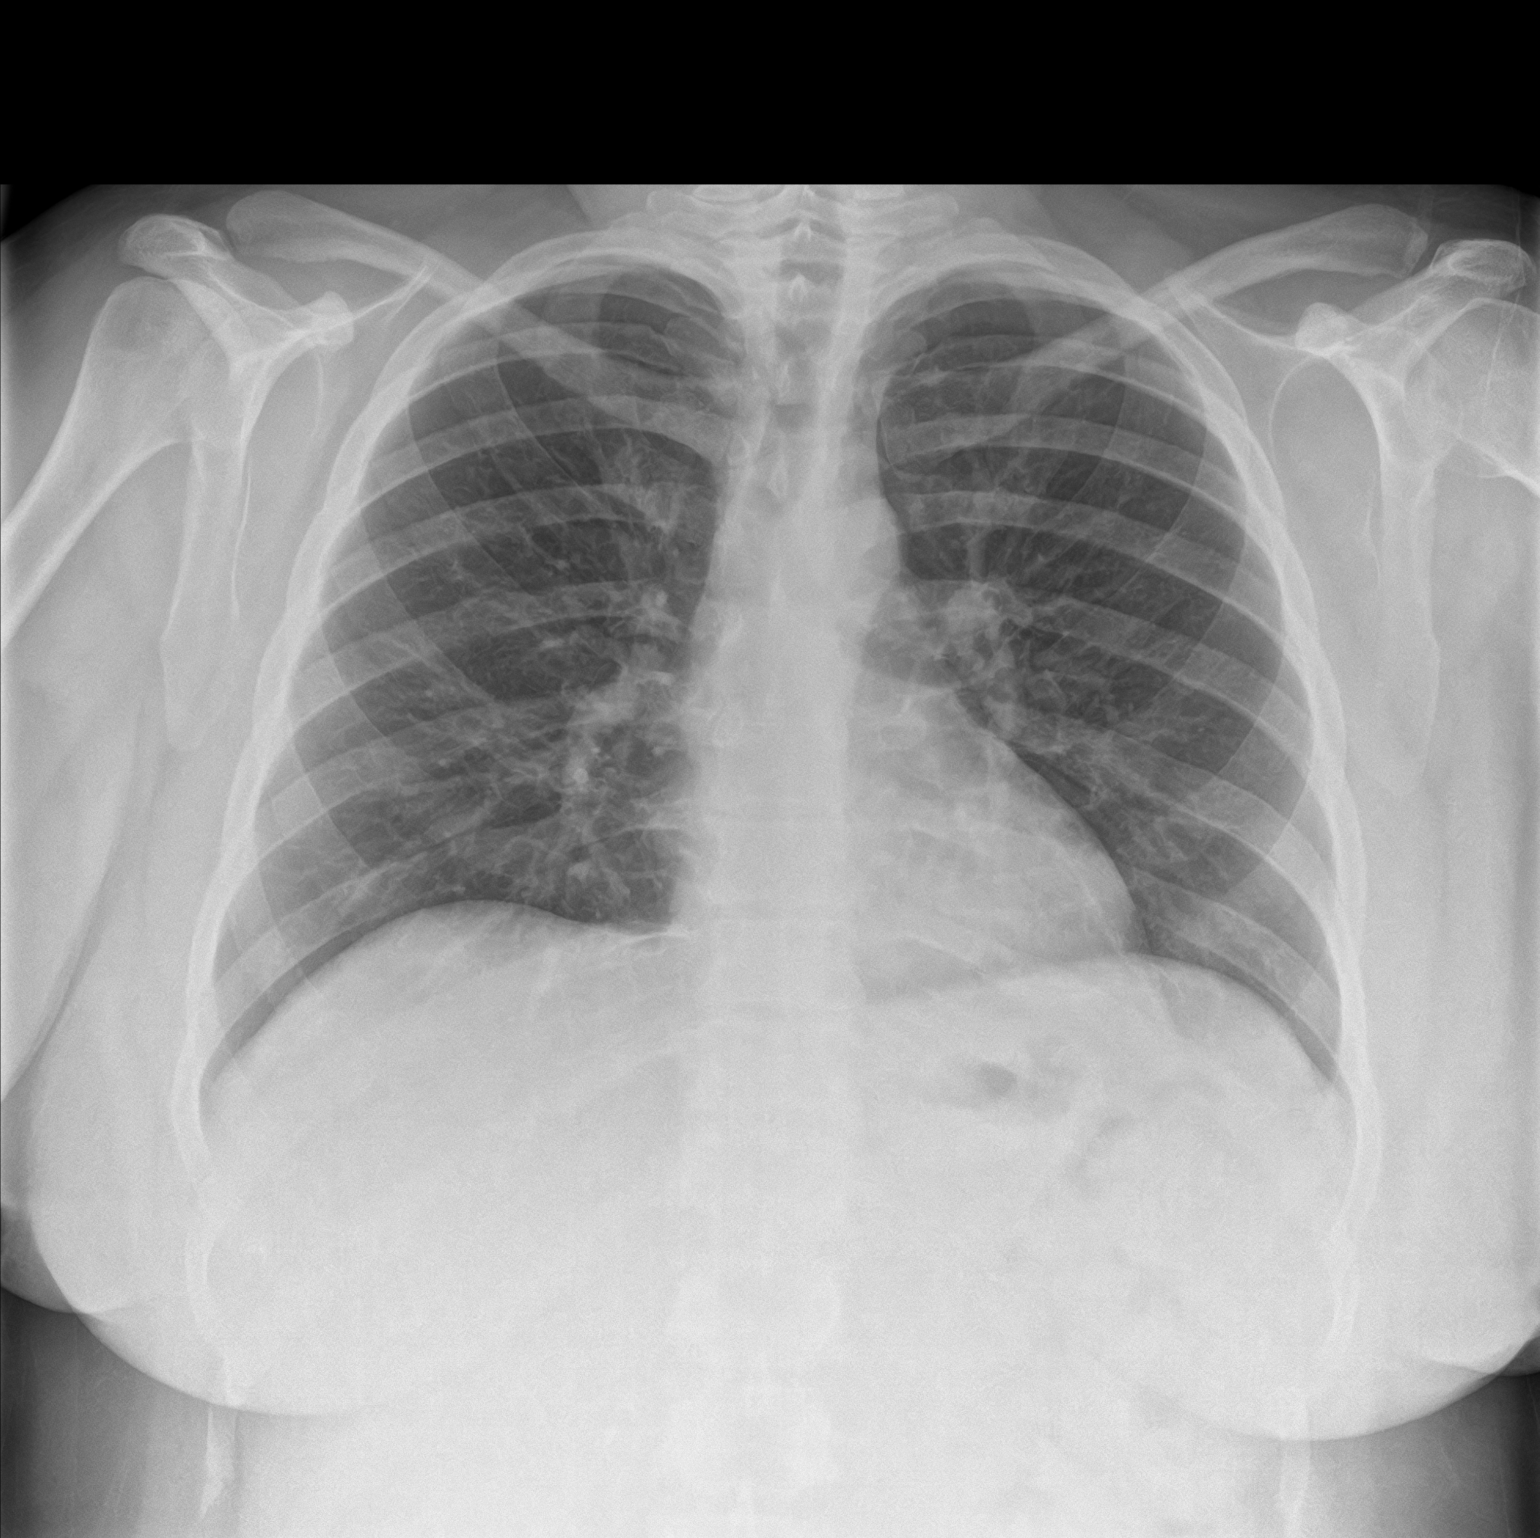
[im 2/2]
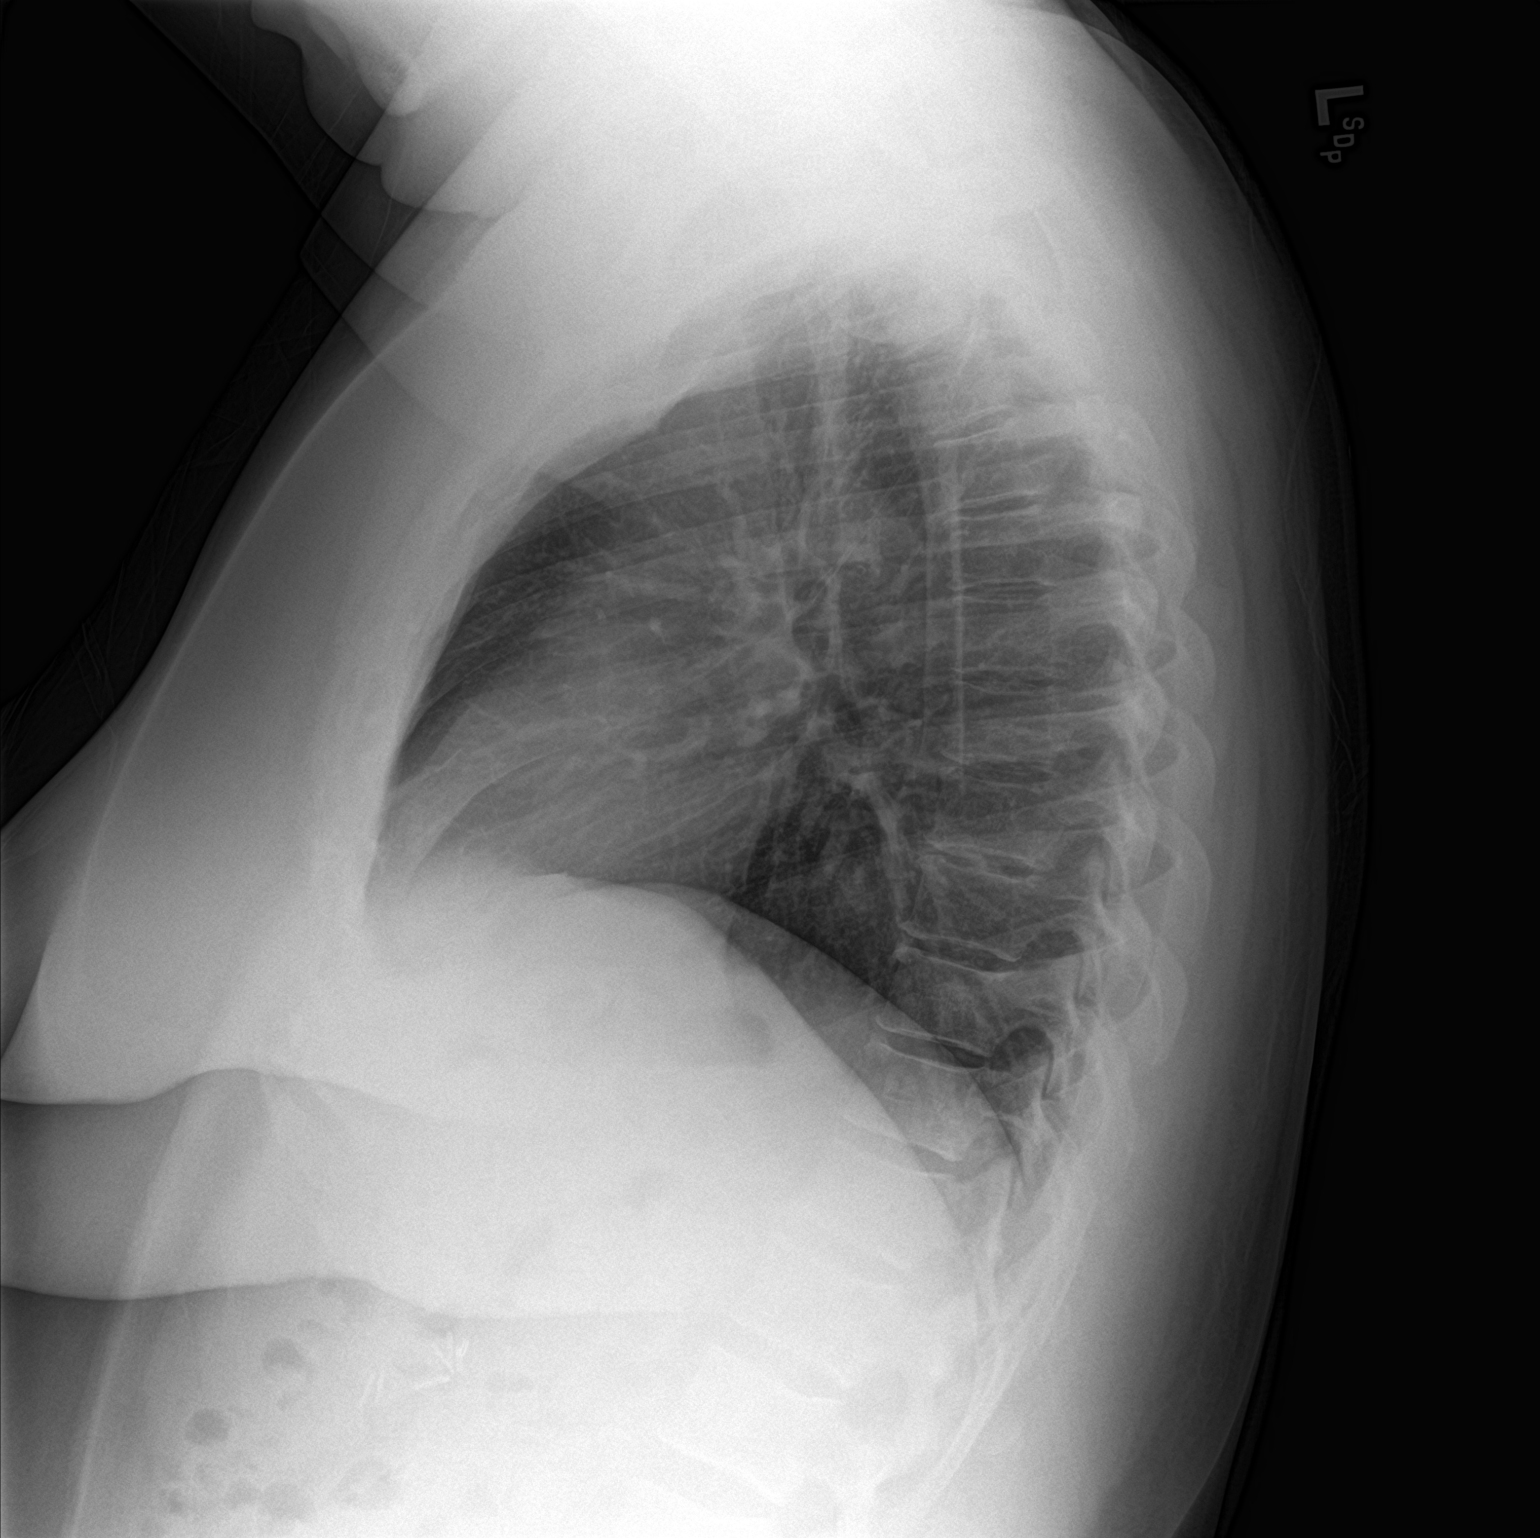

[2 of 2 positions shown; findings below may reference images not displayed]

FINDINGS: Lungs clear. Heart size normal. No pneumothorax or pleural fluid. No
bony abnormality.
IMPRESSION: Negative chest.

## 2019-11-09 ENCOUNTER — Ambulatory Visit: Payer: Self-pay | Admitting: Primary Care

## 2019-11-09 ENCOUNTER — Encounter: Payer: Self-pay | Admitting: Primary Care

## 2019-11-09 ENCOUNTER — Other Ambulatory Visit: Payer: Self-pay

## 2019-11-09 VITALS — BP 126/78 | HR 86 | Temp 96.7°F | Ht 66.0 in | Wt 299.5 lb

## 2019-11-09 DIAGNOSIS — F332 Major depressive disorder, recurrent severe without psychotic features: Secondary | ICD-10-CM

## 2019-11-09 DIAGNOSIS — F4310 Post-traumatic stress disorder, unspecified: Secondary | ICD-10-CM

## 2019-11-09 DIAGNOSIS — E1165 Type 2 diabetes mellitus with hyperglycemia: Secondary | ICD-10-CM

## 2019-11-09 DIAGNOSIS — F411 Generalized anxiety disorder: Secondary | ICD-10-CM

## 2019-11-09 DIAGNOSIS — Z111 Encounter for screening for respiratory tuberculosis: Secondary | ICD-10-CM

## 2019-11-09 MED ORDER — CITALOPRAM HYDROBROMIDE 20 MG PO TABS: 20.0000 mg | ORAL_TABLET | Freq: Every day | ORAL | 3 refills | Status: DC

## 2019-11-09 NOTE — Assessment & Plan Note (Signed)
Compliant only to Basaglar 50 units BID, has been out of Trulicity and Novolog due to lack of insurance coverage. She will not get insurance for another 6 weeks.  Discussed to continue her supply of Basaglar 50 units BID. Also discussed to purchase Regular insulin at [facility] and inject the 12 units TID with meals as she was with Novolog.   Strongly advised she resume checking blood glucose levels. She will continue to follow with [place] for her diabetes care as she cannot afford to see endocrinology.   Follow up in mid May 2021 when her insurance resumes.

## 2019-11-09 NOTE — Progress Notes (Signed)
Subjective:    Patient ID: Joan [name], female    DOB: 04/25/1981, 39 y.o.   MRN: [id number]  HPI  This visit occurred during the SARS-CoV-2 public health emergency.  Safety protocols were in place, including screening questions prior to the visit, additional usage of staff PPE, and extensive cleaning of exam room while observing appropriate contact time as indicated for disinfecting solutions.   Joan [name] is a 39 year old female with a history of uncontrolled diabetes, hypertension, asthma, MDD/PTSD who presents today for follow up of diabetes and form completion.  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 She is needing a form completed and a TB skin test.  Current medications include: Basaglar 50 BID, Novolog 12 units with meals, Trulicity 1.5 mg weekly. She resumed her [place] recently.  She has not set up an appointment with endocrinology as she does not have insurance at the moment. She prefers to follow with [place] for diabetes care.   She's been out of her Novolog and Trulicity for the last three weeks due to lack of insurance coverage. She will be getting insurance in May 2021.   She is checking her blood glucose 0 times daily except for today which was 268.  Last A1C: 11.7 in January 2021 Last Eye Exam: UTD Last Foot Exam: Due Pneumonia Vaccination: Completed in 2017 ACE/ARB: Losartan. She stopped taking three weeks ago Statin: Crestor. She stopped taking three weeks ago.    BP Readings from Last 3 Encounters:  11/09/19 126/78  10/04/19 125/75  10/04/19 123/90       Review of Systems  Eyes: Negative for visual disturbance.  Respiratory: Negative for shortness of breath.   Cardiovascular: Negative for chest pain.  Neurological: Negative for dizziness.       Burning to plantar feet at times       Past Medical History:  Diagnosis Date  . Asthma   . Auditory hallucinations    only after anesthesia  . Diabetes mellitus without complication  (HCC)    diet controlled  . Diabetes mellitus, type II (HCC)    insulin, jardiance  . Dyspnea    with exertion  . Essential hypertension   . Essential hypertension 11/24/2017  . GERD (gastroesophageal reflux disease)    occasionally-NO MEDS  . History of placement of ear tubes 05/20/2018  . Hyperlipidemia   . MDD (major depressive disorder)   . Miscarriage   . PTSD (post-traumatic stress disorder)   . Tachycardia      Social History   Socioeconomic History  . Marital status: Married    Spouse name: chip  . Number of children: 0  . Years of education: Not on file  . Highest education level: High school graduate  Occupational History  . Occupation: day care worker  Tobacco Use  . Smoking status: Never Smoker  . Smokeless tobacco: Never Used  Substance and Sexual Activity  . Alcohol use: No  . Drug use: No  . Sexual activity: Yes  Other Topics Concern  . Not on file  Social History Narrative  . Not on file   Social Determinants of Health   Financial Resource Strain:   . Difficulty of Paying Living Expenses:   Food Insecurity:   . Worried About Charity fundraiser in the Last Year:   . Arboriculturist in the Last Year:   Transportation Needs:   . Film/video editor (Medical):   Marland Kitchen Lack of Transportation (  Non-Medical):   Physical Activity:   . Days of Exercise per Week:   . Minutes of Exercise per Session:   Stress:   . Feeling of Stress :   Social Connections:   . Frequency of Communication with Friends and Family:   . Frequency of Social Gatherings with Friends and Family:   . Attends Religious Services:   . Active Member of Clubs or Organizations:   . Attends Archivist Meetings:   Marland Kitchen Marital Status:   Intimate Partner Violence:   . Fear of Current or Ex-Partner:   . Emotionally Abused:   Marland Kitchen Physically Abused:   . Sexually Abused:     Past Surgical History:  Procedure Laterality Date  . ABDOMINAL HYSTERECTOMY    . ADENOIDECTOMY    .  CHOLECYSTECTOMY  2009  . CYSTOSCOPY N/A 07/31/2018   Procedure: CYSTOSCOPY;  Surgeon: Benjaman Kindler, MD;  Location: [facility];  Service: Gynecology;  Laterality: N/A;  . LAPAROSCOPIC BILATERAL SALPINGECTOMY Bilateral 07/31/2018   Procedure: LAPAROSCOPIC BILATERAL SALPINGECTOMY;  Surgeon: Benjaman Kindler, MD;  Location: [facility];  Service: Gynecology;  Laterality: Bilateral;  . LAPAROSCOPIC HYSTERECTOMY N/A 07/31/2018   Procedure: HYSTERECTOMY TOTAL LAPAROSCOPIC;  Surgeon: Benjaman Kindler, MD;  Location: [facility];  Service: Gynecology;  Laterality: N/A;  . LAPAROSCOPY N/A 06/07/2019   Procedure: LAPAROSCOPY OPERATIVE, WITH PERITONEAL BIOPSIES;  Surgeon: Benjaman Kindler, MD;  Location: [facility];  Service: Gynecology;  Laterality: N/A;  . LYSIS OF ADHESION N/A 07/31/2018   Procedure: LYSIS OF ADHESION;  Surgeon: Benjaman Kindler, MD;  Location: [facility];  Service: Gynecology;  Laterality: N/A;  . OVARY SURGERY Right    cyst removed a while ago  . TONSILLECTOMY    . tubes in ear      Family History  Problem Relation Age of Onset  . Asthma Mother   . Diabetes Mother   . Hyperlipidemia Mother   . Hypertension Mother   . Diabetes Father   . Hyperlipidemia Father   . Hypertension Father   . Diabetes Brother   . Depression Brother   . Alcohol abuse Brother   . Depression Maternal Grandmother   . Stroke Paternal Grandmother   . Breast cancer Neg Hx     Allergies  Allergen Reactions  . Ibuprofen Swelling    Facial   . Ciprofloxacin Hives and Rash  . Metformin And Related Other (See Comments)    Elevated Lactic Acid  . Penicillins Hives and Rash    Has patient had a PCN reaction causing immediate rash, facial/tongue/throat swelling, SOB or lightheadedness with hypotension: No Has patient had a PCN reaction causing severe rash involving mucus membranes or skin necrosis: No Has patient had a PCN reaction that required hospitalization: No Has patient had a PCN reaction occurring  within the last 10 years: No If all of the above answers are "NO", then may proceed with Cephalosporin use. THE PATIENT IS ABLE TO TOLERATE CEPHALOSPORINS WITHOUT DIFFIC  . Sulfa Antibiotics Hives and Rash  . Other     ALL NUTS-SCRATCHY THROAT  . Shellfish Allergy Other (See Comments)    ALL SEAFOOD-SCRATCHY THROAT    Current Outpatient Medications on File Prior to Visit  Medication Sig Dispense Refill  . albuterol (VENTOLIN HFA) 108 (90 Base) MCG/ACT inhaler INHALE 2 PUFFS BY MOUTH EVERY 6 HOURS AS NEEDED FOR SHORTNESS OF BREATH AND WHEEZING (Patient taking differently: Inhale 2 puffs into the lungs every 6 (six) hours as needed for wheezing or shortness of breath. INHALE  2 PUFFS BY MOUTH EVERY 6 HOURS AS NEEDED FOR SHORTNESS OF BREATH AND WHEEZING) 18 g 0  . blood glucose meter kit and supplies KIT Dispense based on patient and insurance preference. Use up three times daily as directed. (FOR ICD-9 250.00, 250.01). 1 each 0  . Continuous Blood Gluc Receiver (FREESTYLE LIBRE 14 DAY READER) DEVI 1 Device by Does not apply route 3 (three) times daily as needed. 1 each 0  . Continuous Blood Gluc Sensor (FREESTYLE LIBRE 14 DAY SENSOR) MISC 1 Device by Does not apply route 3 (three) times daily as needed. 2 each 2  . Dulaglutide (TRULICITY) 1.5 MM/0.3FV SOPN Inject 1.5 mg into the skin once a week. 2 mL 2  . insulin aspart (NOVOLOG FLEXPEN) 100 UNIT/ML FlexPen Inject 8 Units into the skin 3 (three) times daily with meals. 15 mL 3  . Insulin Glargine (BASAGLAR KWIKPEN) 100 UNIT/ML SOPN Inject 30 units in the morning and 50 units in the evening for diabetes. 30 mL 5  . Insulin Pen Needle (PEN NEEDLES) 31G X 6 MM MISC Use with insulin as directed. 100 each 2  . losartan (COZAAR) 25 MG tablet Take 1 tablet (25 mg total) by mouth daily. For blood pressure. 90 tablet 0  . montelukast (SINGULAIR) 10 MG tablet Take 10 mg by mouth at bedtime.    . rosuvastatin (CRESTOR) 10 MG tablet Take 1 tablet (10 mg  total) by mouth every evening. For cholesterol. 90 tablet 3   No current facility-administered medications on file prior to visit.    BP 126/78   Pulse 86   Temp (!) 96.7 F (35.9 C) (Temporal)   Ht '5\' 6"'  (1.676 m)   Wt 299 lb 8 oz (135.9 kg)   LMP 07/24/2018 (Exact Date) Comment: surgery 07/31/2018  SpO2 98%   BMI 48.34 kg/m    Objective:   Physical Exam  Constitutional: She appears well-nourished.  Cardiovascular: Normal rate and regular rhythm.  Respiratory: Effort normal and breath sounds normal.  Musculoskeletal:     Cervical back: Neck supple.  Skin: Skin is warm and dry.           Assessment & Plan:

## 2019-11-09 NOTE — Addendum Note (Signed)
Addended by: Tawnya Crook on: 11/09/2019 11:33 AM   Modules accepted: Orders

## 2019-11-09 NOTE — Patient Instructions (Signed)
Go to [facility] and purchase regular insulin. Use this in place of your Novolog. Continue Tourist information centre manager.  Start checking your blood sugar levels.  Appropriate times to check your blood sugar levels are:  -Before any meal (breakfast, lunch, dinner) -Two hours after any meal (breakfast, lunch, dinner) -Bedtime  Record your readings and notify me if you continue to consistently run at or above 200.  Please schedule a follow up appointment for mid May 2021 for diabetes check.  It was a pleasure to see you today!   Diabetes Mellitus and Nutrition, Adult When you have diabetes (diabetes mellitus), it is very important to have healthy eating habits because your blood sugar (glucose) levels are greatly affected by what you eat and drink. Eating healthy foods in the appropriate amounts, at about the same times every day, can help you:  Control your blood glucose.  Lower your risk of heart disease.  Improve your blood pressure.  Reach or maintain a healthy weight. Every person with diabetes is different, and each person has different needs for a meal plan. Your health care provider may recommend that you work with a diet and nutrition specialist (dietitian) to make a meal plan that is best for you. Your meal plan may vary depending on factors such as:  The calories you need.  The medicines you take.  Your weight.  Your blood glucose, blood pressure, and cholesterol levels.  Your activity level.  Other health conditions you have, such as heart or kidney disease. How do carbohydrates affect me? Carbohydrates, also called carbs, affect your blood glucose level more than any other type of food. Eating carbs naturally raises the amount of glucose in your blood. Carb counting is a method for keeping track of how many carbs you eat. Counting carbs is important to keep your blood glucose at a healthy level, especially if you use insulin or take certain oral diabetes medicines. It is important  to know how many carbs you can safely have in each meal. This is different for every person. Your dietitian can help you calculate how many carbs you should have at each meal and for each snack. Foods that contain carbs include:  Bread, cereal, rice, pasta, and crackers.  Potatoes and corn.  Peas, beans, and lentils.  Milk and yogurt.  Fruit and juice.  Desserts, such as cakes, cookies, ice cream, and candy. How does alcohol affect me? Alcohol can cause a sudden decrease in blood glucose (hypoglycemia), especially if you use insulin or take certain oral diabetes medicines. Hypoglycemia can be a life-threatening condition. Symptoms of hypoglycemia (sleepiness, dizziness, and confusion) are similar to symptoms of having too much alcohol. If your health care provider says that alcohol is safe for you, follow these guidelines:  Limit alcohol intake to no more than 1 drink per day for nonpregnant women and 2 drinks per day for men. One drink equals 12 oz of beer, 5 oz of wine, or 1 oz of hard liquor.  Do not drink on an empty stomach.  Keep yourself hydrated with water, diet soda, or unsweetened iced tea.  Keep in mind that regular soda, juice, and other mixers may contain a lot of sugar and must be counted as carbs. What are tips for following this plan?  Reading food labels  Start by checking the serving size on the "Nutrition Facts" label of packaged foods and drinks. The amount of calories, carbs, fats, and other nutrients listed on the label is based on one serving of  the item. Many items contain more than one serving per package.  Check the total grams (g) of carbs in one serving. You can calculate the number of servings of carbs in one serving by dividing the total carbs by 15. For example, if a food has 30 g of total carbs, it would be equal to 2 servings of carbs.  Check the number of grams (g) of saturated and trans fats in one serving. Choose foods that have low or no amount  of these fats.  Check the number of milligrams (mg) of salt (sodium) in one serving. Most people should limit total sodium intake to less than 2,300 mg per day.  Always check the nutrition information of foods labeled as "low-fat" or "nonfat". These foods may be higher in added sugar or refined carbs and should be avoided.  Talk to your dietitian to identify your daily goals for nutrients listed on the label. Shopping  Avoid buying canned, premade, or processed foods. These foods tend to be high in fat, sodium, and added sugar.  Shop around the outside edge of the grocery store. This includes fresh fruits and vegetables, bulk grains, fresh meats, and fresh dairy. Cooking  Use low-heat cooking methods, such as baking, instead of high-heat cooking methods like deep frying.  Cook using healthy oils, such as olive, canola, or sunflower oil.  Avoid cooking with butter, cream, or high-fat meats. Meal planning  Eat meals and snacks regularly, preferably at the same times every day. Avoid going long periods of time without eating.  Eat foods high in fiber, such as fresh fruits, vegetables, beans, and whole grains. Talk to your dietitian about how many servings of carbs you can eat at each meal.  Eat 4-6 ounces (oz) of lean protein each day, such as lean meat, chicken, fish, eggs, or tofu. One oz of lean protein is equal to: ? 1 oz of meat, chicken, or fish. ? 1 egg. ?  cup of tofu.  Eat some foods each day that contain healthy fats, such as avocado, nuts, seeds, and fish. Lifestyle  Check your blood glucose regularly.  Exercise regularly as told by your health care provider. This may include: ? 150 minutes of moderate-intensity or vigorous-intensity exercise each week. This could be brisk walking, biking, or water aerobics. ? Stretching and doing strength exercises, such as yoga or weightlifting, at least 2 times a week.  Take medicines as told by your health care provider.  Do not  use any products that contain nicotine or tobacco, such as cigarettes and e-cigarettes. If you need help quitting, ask your health care provider.  Work with a Social worker or diabetes educator to identify strategies to manage stress and any emotional and social challenges. Questions to ask a health care provider  Do I need to meet with a diabetes educator?  Do I need to meet with a dietitian?  What number can I call if I have questions?  When are the best times to check my blood glucose? Where to find more information:  American Diabetes Association: diabetes.org  Academy of Nutrition and Dietetics: www.eatright.[employer] of Diabetes and Digestive and Kidney Diseases (NIH): [employer] Summary  A healthy meal plan will help you control your blood glucose and maintain a healthy lifestyle.  Working with a diet and nutrition specialist (dietitian) can help you make a meal plan that is best for you.  Keep in mind that carbohydrates (carbs) and alcohol have immediate effects on your blood glucose levels.  It is important to count carbs and to use alcohol carefully. This information is not intended to replace advice given to you by your health care provider. Make sure you discuss any questions you have with your health care provider. Document Revised: 07/04/2017 Document Reviewed: 08/26/2016 Elsevier Patient Education  2020 [employer].

## 2019-11-11 LAB — TB SKIN TEST
Induration: 0 mm
TB Skin Test: NEGATIVE

## 2019-12-17 ENCOUNTER — Encounter: Payer: Self-pay | Admitting: Internal Medicine

## 2019-12-17 ENCOUNTER — Telehealth (INDEPENDENT_AMBULATORY_CARE_PROVIDER_SITE_OTHER): Payer: Self-pay | Admitting: Internal Medicine

## 2019-12-17 VITALS — Temp 100.2°F

## 2019-12-17 DIAGNOSIS — J069 Acute upper respiratory infection, unspecified: Secondary | ICD-10-CM

## 2019-12-17 NOTE — Progress Notes (Signed)
Virtual Visit via Video Note  I connected with Joan [name] on 12/17/19 at  2:15 PM EDT by a video enabled telemedicine application and verified that I am speaking with the correct person using two identifiers.  Location: Patient: Home Provider: Office   I discussed the limitations of evaluation and management by telemedicine and the availability of in person appointments. The patient expressed understanding and agreed to proceed. HPI  Pt presents to the clinic today with headache, sinus pressure, ear pain, nasal congestion and sore throat. This started 2 days ago. The headache is located in her forehead. She describes the pain as pressure. She feels the sinus pressure in her cheeks. She is blowing blood out of her nose. She denies difficulty swallowing. She denies vision changes, runny nose, loss of taste or smell, cough or SOB. She reports fever up to 101, and chills. She denies body aches.She has not taken anything for this. She can have taste and smell. She has not taken anything OTC for this. She has not had sick contacts or exposure to Covid that she is aware of. She has a history of allergies, sinus infections and asthma.  Review of Systems     Past Medical History:  Diagnosis Date  . Acute asthma exacerbation 04/10/2018  . Acute non-recurrent maxillary sinusitis 09/25/2018  . Asthma   . Auditory hallucinations    only after anesthesia  . BV (bacterial vaginosis) 02/13/2018  . Diabetes mellitus without complication (HCC)    diet controlled  . Diabetes mellitus, type II (HCC)    insulin, jardiance  . Dyspnea    with exertion  . Essential hypertension   . Essential hypertension 11/24/2017  . GERD (gastroesophageal reflux disease)    occasionally-NO MEDS  . History of placement of ear tubes 05/20/2018  . Hyperlipidemia   . MDD (major depressive disorder)   . Miscarriage   . Nausea & vomiting 10/05/2019  . PTSD (post-traumatic stress disorder)   . Right lower quadrant abdominal  pain 02/13/2018  . Tachycardia     Family History  Problem Relation Age of Onset  . Asthma Mother   . Diabetes Mother   . Hyperlipidemia Mother   . Hypertension Mother   . Diabetes Father   . Hyperlipidemia Father   . Hypertension Father   . Diabetes Brother   . Depression Brother   . Alcohol abuse Brother   . Depression Maternal Grandmother   . Stroke Paternal Grandmother   . Breast cancer Neg Hx     Social History   Socioeconomic History  . Marital status: Married    Spouse name: chip  . Number of children: 0  . Years of education: Not on file  . Highest education level: High school graduate  Occupational History  . Occupation: day care worker  Tobacco Use  . Smoking status: Never Smoker  . Smokeless tobacco: Never Used  Substance and Sexual Activity  . Alcohol use: No  . Drug use: No  . Sexual activity: Yes  Other Topics Concern  . Not on file  Social History Narrative  . Not on file   Social Determinants of Health   Financial Resource Strain:   . Difficulty of Paying Living Expenses:   Food Insecurity:   . Worried About Programme researcher, broadcasting/film/video in the Last Year:   . Barista in the Last Year:   Transportation Needs:   . Freight forwarder (Medical):   Marland Kitchen Lack of Transportation (Non-Medical):  Physical Activity:   . Days of Exercise per Week:   . Minutes of Exercise per Session:   Stress:   . Feeling of Stress :   Social Connections:   . Frequency of Communication with Friends and Family:   . Frequency of Social Gatherings with Friends and Family:   . Attends Religious Services:   . Active Member of Clubs or Organizations:   . Attends Archivist Meetings:   Marland Kitchen Marital Status:   Intimate Partner Violence:   . Fear of Current or Ex-Partner:   . Emotionally Abused:   Marland Kitchen Physically Abused:   . Sexually Abused:     Allergies  Allergen Reactions  . Ibuprofen Swelling    Facial   . Ciprofloxacin Hives and Rash  . Metformin And  Related Other (See Comments)    Elevated Lactic Acid  . Penicillins Hives and Rash    Has patient had a PCN reaction causing immediate rash, facial/tongue/throat swelling, SOB or lightheadedness with hypotension: No Has patient had a PCN reaction causing severe rash involving mucus membranes or skin necrosis: No Has patient had a PCN reaction that required hospitalization: No Has patient had a PCN reaction occurring within the last 10 years: No If all of the above answers are "NO", then may proceed with Cephalosporin use. THE PATIENT IS ABLE TO TOLERATE CEPHALOSPORINS WITHOUT DIFFIC  . Sulfa Antibiotics Hives and Rash  . Other     ALL NUTS-SCRATCHY THROAT  . Shellfish Allergy Other (See Comments)    ALL SEAFOOD-SCRATCHY THROAT     Constitutional: Positive headache, fatigue and fever. Denies abrupt weight changes.  HEENT:  Positive  facial pain, nasal congestion and sore throat. Denies eye redness, ear pain, ringing in the ears, wax buildup, runny nose or bloody nose. Respiratory:  Denies cough, difficulty breathing or shortness of breath.  Cardiovascular: Denies chest pain, chest tightness, palpitations or swelling in the hands or feet.   No other specific complaints in a complete review of systems (except as listed in HPI above).  Objective:   LMP 07/24/2018 (Exact Date) Comment: surgery 07/31/2018  General: Appears her stated age, ill appearing but in NAD. HEENT: Head: normal shape and size, maxillary sinus tenderness noted; Nose: congestion noted; Throat/Mouth: hoarseness noted.  Pulmonary/Chest: Normal effort. No respiratory distress.       Assessment & Plan:   Viral URI with Cough:  Can not rule out Covid- would recommend she get tested Would recommend Zyrtec, Flonase and Ibuprofen for symptoms She decline RX for cough syrup at this time Recommend self quarantine, frequent handwashing, masking and social distancing until test results received. ER precautions  discussed  RTC as needed or if symptoms persist. Webb Silversmith, NP   Follow Up Instructions:    I discussed the assessment and treatment plan with the patient. The patient was provided an opportunity to ask questions and all were answered. The patient agreed with the plan and demonstrated an understanding of the instructions.   The patient was advised to call back or seek an in-person evaluation if the symptoms worsen or if the condition fails to improve as anticipated.     Webb Silversmith, NP

## 2019-12-18 ENCOUNTER — Encounter: Payer: Self-pay | Admitting: Internal Medicine

## 2019-12-18 NOTE — Patient Instructions (Signed)

## 2019-12-20 DIAGNOSIS — N6489 Other specified disorders of breast: Secondary | ICD-10-CM

## 2019-12-29 ENCOUNTER — Ambulatory Visit: Payer: Self-pay | Admitting: Primary Care

## 2019-12-29 DIAGNOSIS — Z0289 Encounter for other administrative examinations: Secondary | ICD-10-CM

## 2020-01-11 ENCOUNTER — Ambulatory Visit
Admission: RE | Admit: 2020-01-11 | Discharge: 2020-01-11 | Disposition: A | Discharge status: Home / Self Care | Payer: Self-pay | Source: Ambulatory Visit | Attending: Oncology | Admitting: Oncology

## 2020-01-11 ENCOUNTER — Ambulatory Visit: Payer: Self-pay | Attending: Oncology | Admitting: *Deleted

## 2020-01-11 ENCOUNTER — Other Ambulatory Visit: Payer: Self-pay

## 2020-01-11 ENCOUNTER — Encounter: Payer: Self-pay | Admitting: *Deleted

## 2020-01-11 VITALS — BP 144/99 | HR 79 | Temp 97.5°F | Ht 66.0 in | Wt 285.4 lb

## 2020-01-11 DIAGNOSIS — N6489 Other specified disorders of breast: Secondary | ICD-10-CM

## 2020-01-11 NOTE — Progress Notes (Signed)
  Subjective:     Patient ID: Joan [name], female   DOB: 01-Jun-1981, 39 y.o.   MRN: [id number]  HPI   Review of Systems     Objective:   Physical Exam Chest:     Breasts:        Right: No swelling, bleeding, inverted nipple, mass, nipple discharge, skin change or tenderness.        Left: No swelling, bleeding, inverted nipple, mass, nipple discharge, skin change or tenderness.    Lymphadenopathy:     Upper Body:     Right upper body: No supraclavicular or axillary adenopathy.     Left upper body: No supraclavicular or axillary adenopathy.        Assessment:     39 year old White female presents to [facility] for clinical breast exam and mammogram only.  Patient had a birads 3 mammogram on 07/19/19 and is also due for her annual exam.  Her husband accompanies her today.  On clinical breast exam there is no dominant mass, skin changes, nipple discharge or lymphadenopathy.  Patient does complain of some tenderness across the upper left breast on palpation.  Taught self breast awareness.  Patient has a history of hysterectomy in 2019.  She will follow up per [facility]P guidelines and Dr. Dalbert Garnet.  Patient has been screened for eligibility.  She does not have any insurance, Medicare or Medicaid.  She also meets financial eligibility.   Risk Assessment    Risk Scores      01/11/2020   Last edited by: Scarlett Presto, RN   5-year risk: 0.7 %   Lifetime risk: 12.1 %              Plan:     Bilateral diagnostic mammogram and ultrasound ordered for follow up of asymmetry.  Will follow up per [facility] protocol.

## 2020-01-11 NOTE — Patient Instructions (Signed)
Gave patient hand-out, Women Staying Healthy, Active and Well from [facility], with education on breast health, pap smears, heart and colon health. 

## 2020-01-19 ENCOUNTER — Encounter: Payer: Self-pay | Admitting: *Deleted

## 2020-01-19 NOTE — Progress Notes (Signed)
Letter mailed from [facility] to inform patient of her normal mammogram results.  Patient is to follow-up with annual screening in one year. 

## 2020-01-20 ENCOUNTER — Encounter: Payer: Self-pay | Admitting: Emergency Medicine

## 2020-01-20 ENCOUNTER — Ambulatory Visit
Admission: EM | Admit: 2020-01-20 | Discharge: 2020-01-20 | Disposition: A | Discharge status: Home / Self Care | Payer: Self-pay | Attending: Emergency Medicine | Admitting: Emergency Medicine

## 2020-01-20 ENCOUNTER — Other Ambulatory Visit: Payer: Self-pay

## 2020-01-20 DIAGNOSIS — R112 Nausea with vomiting, unspecified: Secondary | ICD-10-CM | POA: Insufficient documentation

## 2020-01-20 DIAGNOSIS — N760 Acute vaginitis: Secondary | ICD-10-CM | POA: Insufficient documentation

## 2020-01-20 DIAGNOSIS — N390 Urinary tract infection, site not specified: Secondary | ICD-10-CM

## 2020-01-20 LAB — URINALYSIS, COMPLETE (UACMP) WITH MICROSCOPIC
Bilirubin Urine: NEGATIVE
Glucose, UA: >1000 mg/dL — AB
Hgb urine dipstick: NEGATIVE
Leukocytes,Ua: NEGATIVE
Nitrite: NEGATIVE
Protein, ur: NEGATIVE mg/dL
RBC / HPF: NONE SEEN RBC/hpf (ref 0–5)
Specific Gravity, Urine: 1.025 (ref 1.005–1.030)
pH: 5.5 (ref 5.0–8.0)

## 2020-01-20 LAB — GLUCOSE, CAPILLARY: Glucose-Capillary: 266 mg/dL — ABNORMAL HIGH (ref 70–99)

## 2020-01-20 MED ORDER — NITROFURANTOIN MONOHYD MACRO 100 MG PO CAPS
100.0000 mg | ORAL_CAPSULE | Freq: Two times a day (BID) | ORAL | 0 refills | Status: DC
Start: 2020-01-20 — End: 2020-02-02

## 2020-01-20 MED ORDER — FLUCONAZOLE 150 MG PO TABS
150.0000 mg | ORAL_TABLET | Freq: Every day | ORAL | 0 refills | Status: DC
Start: 2020-01-20 — End: 2020-02-02

## 2020-01-20 MED ORDER — ONDANSETRON 4 MG PO TBDP: 4.0000 mg | ORAL_TABLET | Freq: Three times a day (TID) | ORAL | 0 refills | Status: DC | PRN

## 2020-01-20 NOTE — ED Triage Notes (Signed)
Pt c/o vomiting, dysuria, lower back pain. Started yesterday morning but vomiting started early this morning. She also has lower abdominal pain. Denies fever. She states she was tested for covid yesterday at work (rapid) and was negative.

## 2020-01-20 NOTE — ED Provider Notes (Signed)
[facility] ____________________________________________  Time seen: Approximately 10:08 PM  I have reviewed the triage vital signs and the nursing notes.   HISTORY  Chief Complaint Emesis, Dysuria, and Back Pain   HPI Joan [name] is a 39 y.o. female past medical history of diabetes, hypertension and asthma presenting for evaluation of 2 days of urinary frequency, urinary urgency and burning with urination.  Reports she has also had low back pain, right worse than left and vomiting today.  Last vomited around 4 PM.  Has tolerated drinking fluids since.  Reports some suprapubic pressure.  Denies cough, congestion, chest pain or shortness of breath, fevers.  Denies blood in urine.  States urine may have looked cloudy today.  Has not been checking her blood sugars.  Some vaginal irritation.  Denies aggravating or alleviating factors.  Denies other recent sickness.  Pleas Koch, NP : PCP    Past Medical History:  Diagnosis Date  . Acute asthma exacerbation 04/10/2018  . Acute non-recurrent maxillary sinusitis 09/25/2018  . Asthma   . Auditory hallucinations    only after anesthesia  . BV (bacterial vaginosis) 02/13/2018  . Diabetes mellitus without complication (HCC)    diet controlled  . Diabetes mellitus, type II (HCC)    insulin, jardiance  . Dyspnea    with exertion  . Essential hypertension   . Essential hypertension 11/24/2017  . GERD (gastroesophageal reflux disease)    occasionally-NO MEDS  . History of placement of ear tubes 05/20/2018  . Hyperlipidemia   . MDD (major depressive disorder)   . Miscarriage   . Nausea & vomiting 10/05/2019  . PTSD (post-traumatic stress disorder)   . Right lower quadrant abdominal pain 02/13/2018  . Tachycardia     Patient Active Problem List   Diagnosis Date Noted  . COVID-19 virus infection 08/16/2019  . Kidney stone 02/11/2019  . Asthma 11/03/2018  . Recurrent sinusitis 05/20/2018  . Recurrent fever of unknown  etiology 05/08/2018  . Recurrent URI (upper respiratory infection) 05/08/2018  . Tachycardia 05/04/2018  . Missed period 04/29/2018  . Diabetes mellitus type 2, uncontrolled ([place]) 11/24/2017  . Essential hypertension 11/24/2017  . Hyperlipidemia 11/24/2017  . Major depressive disorder, recurrent, severe without psychotic features ([place])   . PTSD (post-traumatic stress disorder) 04/11/2015    Past Surgical History:  Procedure Laterality Date  . ABDOMINAL HYSTERECTOMY    . ADENOIDECTOMY    . CHOLECYSTECTOMY  2009  . CYSTOSCOPY N/A 07/31/2018   Procedure: CYSTOSCOPY;  Surgeon: Benjaman Kindler, MD;  Location: [facility];  Service: Gynecology;  Laterality: N/A;  . LAPAROSCOPIC BILATERAL SALPINGECTOMY Bilateral 07/31/2018   Procedure: LAPAROSCOPIC BILATERAL SALPINGECTOMY;  Surgeon: Benjaman Kindler, MD;  Location: [facility];  Service: Gynecology;  Laterality: Bilateral;  . LAPAROSCOPIC HYSTERECTOMY N/A 07/31/2018   Procedure: HYSTERECTOMY TOTAL LAPAROSCOPIC;  Surgeon: Benjaman Kindler, MD;  Location: [facility];  Service: Gynecology;  Laterality: N/A;  . LAPAROSCOPY N/A 06/07/2019   Procedure: LAPAROSCOPY OPERATIVE, WITH PERITONEAL BIOPSIES;  Surgeon: Benjaman Kindler, MD;  Location: [facility];  Service: Gynecology;  Laterality: N/A;  . LYSIS OF ADHESION N/A 07/31/2018   Procedure: LYSIS OF ADHESION;  Surgeon: Benjaman Kindler, MD;  Location: [facility];  Service: Gynecology;  Laterality: N/A;  . OVARY SURGERY Right    cyst removed a while ago  . TONSILLECTOMY    . tubes in ear       No current facility-administered medications for this encounter.  Current Outpatient Medications:  .  albuterol (VENTOLIN HFA)  108 (90 Base) MCG/ACT inhaler, INHALE 2 PUFFS BY MOUTH EVERY 6 HOURS AS NEEDED FOR SHORTNESS OF BREATH AND WHEEZING (Patient taking differently: Inhale 2 puffs into the lungs every 6 (six) hours as needed for wheezing or shortness of breath. INHALE 2 PUFFS BY MOUTH EVERY 6 HOURS AS NEEDED  FOR SHORTNESS OF BREATH AND WHEEZING), Disp: 18 g, Rfl: 0 .  citalopram (CELEXA) 20 MG tablet, Take 1 tablet (20 mg total) by mouth daily. For anxiety., Disp: 90 tablet, Rfl: 3 .  Dulaglutide (TRULICITY) 1.5 HL/4.5GY SOPN, Inject 1.5 mg into the skin once a week., Disp: 2 mL, Rfl: 2 .  blood glucose meter kit and supplies KIT, Dispense based on patient and insurance preference. Use up three times daily as directed. (FOR ICD-9 250.00, 250.01)., Disp: 1 each, Rfl: 0 .  Continuous Blood Gluc Receiver (FREESTYLE LIBRE 14 DAY READER) DEVI, 1 Device by Does not apply route 3 (three) times daily as needed., Disp: 1 each, Rfl: 0 .  Continuous Blood Gluc Sensor (FREESTYLE LIBRE 14 DAY SENSOR) MISC, 1 Device by Does not apply route 3 (three) times daily as needed., Disp: 2 each, Rfl: 2 .  fluconazole (DIFLUCAN) 150 MG tablet, Take 1 tablet (150 mg total) by mouth daily. Take one pill orally, then Repeat in one week as needed., Disp: 2 tablet, Rfl: 0 .  insulin aspart (NOVOLOG FLEXPEN) 100 UNIT/ML FlexPen, Inject 8 Units into the skin 3 (three) times daily with meals., Disp: 15 mL, Rfl: 3 .  Insulin Glargine (BASAGLAR KWIKPEN) 100 UNIT/ML SOPN, Inject 30 units in the morning and 50 units in the evening for diabetes., Disp: 30 mL, Rfl: 5 .  Insulin Pen Needle (PEN NEEDLES) 31G X 6 MM MISC, Use with insulin as directed., Disp: 100 each, Rfl: 2 .  losartan (COZAAR) 25 MG tablet, Take 1 tablet (25 mg total) by mouth daily. For blood pressure., Disp: 90 tablet, Rfl: 0 .  montelukast (SINGULAIR) 10 MG tablet, Take 10 mg by mouth at bedtime., Disp: , Rfl:  .  nitrofurantoin, macrocrystal-monohydrate, (MACROBID) 100 MG capsule, Take 1 capsule (100 mg total) by mouth 2 (two) times daily., Disp: 14 capsule, Rfl: 0 .  ondansetron (ZOFRAN ODT) 4 MG disintegrating tablet, Take 1 tablet (4 mg total) by mouth every 8 (eight) hours as needed., Disp: 15 tablet, Rfl: 0 .  rosuvastatin (CRESTOR) 10 MG tablet, Take 1 tablet (10 mg  total) by mouth every evening. For cholesterol., Disp: 90 tablet, Rfl: 3  Allergies Ibuprofen, Ciprofloxacin, Metformin and related, Penicillins, Sulfa antibiotics, Other, and Shellfish allergy  Family History  Problem Relation Age of Onset  . Asthma Mother   . Diabetes Mother   . Hyperlipidemia Mother   . Hypertension Mother   . Diabetes Father   . Hyperlipidemia Father   . Hypertension Father   . Diabetes Brother   . Depression Brother   . Alcohol abuse Brother   . Depression Maternal Grandmother   . Stroke Paternal Grandmother   . Breast cancer Neg Hx     Social History Social History   Tobacco Use  . Smoking status: Never Smoker  . Smokeless tobacco: Never Used  Vaping Use  . Vaping Use: Never used  Substance Use Topics  . Alcohol use: No  . Drug use: No    Review of Systems Constitutional: No fever ENT: No sore throat. Cardiovascular: Denies chest pain. Respiratory: Denies shortness of breath. Gastrointestinal: As above.  No diarrhea.  No constipation. Genitourinary: Positive  for dysuria. Musculoskeletal: Positive for back pain. Skin: Negative for rash.  ____________________________________________   PHYSICAL EXAM:  VITAL SIGNS: ED Triage Vitals  Enc Vitals Group     BP 01/20/20 1929 (!) 126/100     Pulse Rate 01/20/20 1929 95     Resp 01/20/20 1929 18     Temp 01/20/20 1929 98.4 F (36.9 C)     Temp Source 01/20/20 1929 Oral     SpO2 01/20/20 1929 99 %     Weight 01/20/20 1926 285 lb 7.9 oz (129.5 kg)     Height 01/20/20 1926 '5\' 6"'  (1.676 m)     Head Circumference --      Peak Flow --      Pain Score 01/20/20 1926 8     Pain Loc --      Pain Edu? --      Excl. in [place]? --     Constitutional: Alert and oriented. Well appearing and in no acute distress. Eyes: Conjunctivae are normal.  ENT      Head: Normocephalic and atraumatic.  Cardiovascular: Normal rate, regular rhythm. Grossly normal heart sounds.  Good peripheral  circulation. Respiratory: Normal respiratory effort without tachypnea nor retractions. Breath sounds are clear and equal bilaterally. No wheezes, rales, rhonchi. Gastrointestinal:  Normal Bowel sounds.  Mild diffuse suprapubic tenderness palpation.  Abdomen otherwise soft and nontender.  Minimal right CVA tenderness.  No left CVA tenderness. Musculoskeletal: Mild diffuse lower lumbar bilateral tenderness to palpation, no point midline tenderness. Neurologic:  Normal speech and language.Speech is normal. No gait instability.  Skin:  Skin is warm, dry and intact. No rash noted. Psychiatric: Mood and affect are normal. Speech and behavior are normal. Patient exhibits appropriate insight and judgment   ___________________________________________   LABS (all labs ordered are listed, but only abnormal results are displayed)  Labs Reviewed  URINALYSIS, COMPLETE (UACMP) WITH MICROSCOPIC - Abnormal; Notable for the following components:      Result Value   APPearance HAZY (*)    Glucose, UA >1,000 (*)    Ketones, ur TRACE (*)    Bacteria, UA MANY (*)    All other components within normal limits  GLUCOSE, CAPILLARY - Abnormal; Notable for the following components:   Glucose-Capillary 266 (*)    All other components within normal limits  URINE CULTURE  CBG MONITORING, ED   ____________________________________________   PROCEDURES Procedures     INITIAL IMPRESSION / ASSESSMENT AND PLAN / ED COURSE  Pertinent labs & imaging results that were available during my care of the patient were reviewed by me and considered in my medical decision making (see chart for details).  Overall well-appearing patient.  No acute distress.  Dysuria complaints with vomiting today.  Discussed multiple differentials with patient, felt most likely urinary tract infection, and discussed possible development of polynephritis.  Patient has been tolerating fluids this evening.  Patient with multiple antibiotic  allergies including Cipro, penicillins and sulfa.  Yeast also noted in urine.  Will urine culture and start patient on oral Macrobid, as needed Zofran and Diflucan.  Counseled patient to monitor blood sugar and take her medications for diabetes as prescribed.  Discussed with patient for any worsening flank pain, fevers, inability to tolerate food or fluids, proceed directly to emergency room.  Supportive care.Discussed indication, risks and benefits of medications with patient.   Discussed follow up with Primary care physician this week. Discussed follow up and return parameters including no resolution or any worsening concerns. Patient  verbalized understanding and agreed to plan.   ____________________________________________   FINAL CLINICAL IMPRESSION(S) / ED DIAGNOSES  Final diagnoses:  Urinary tract infection without hematuria, site unspecified  Acute vaginitis  Non-intractable vomiting with nausea, unspecified vomiting type     ED Discharge Orders         Ordered    fluconazole (DIFLUCAN) 150 MG tablet  Daily     Discontinue  Reprint     01/20/20 2101    nitrofurantoin, macrocrystal-monohydrate, (MACROBID) 100 MG capsule  2 times daily     Discontinue  Reprint     01/20/20 2101    ondansetron (ZOFRAN ODT) 4 MG disintegrating tablet  Every 8 hours PRN     Discontinue  Reprint     01/20/20 2101           Note: This dictation was prepared with Dragon dictation along with smaller phrase technology. Any transcriptional errors that result from this process are unintentional.         Marylene Land, NP 01/20/20 2215

## 2020-01-20 NOTE — Discharge Instructions (Signed)
Take medication as prescribed. Rest. Drink plenty of fluids.   Follow up with your primary care physician this week. Monitor your blood sugar.  For fever, increased pain, difficulty tolerating food or fluids, or worsening concerns, go directly to emergency room.

## 2020-01-22 LAB — URINE CULTURE

## 2020-01-23 ENCOUNTER — Other Ambulatory Visit: Payer: Self-pay

## 2020-01-23 ENCOUNTER — Ambulatory Visit
Admission: EM | Admit: 2020-01-23 | Discharge: 2020-01-23 | Disposition: A | Discharge status: Home / Self Care | Payer: Self-pay | Attending: Internal Medicine | Admitting: Internal Medicine

## 2020-01-23 DIAGNOSIS — E119 Type 2 diabetes mellitus without complications: Secondary | ICD-10-CM

## 2020-01-23 DIAGNOSIS — E1165 Type 2 diabetes mellitus with hyperglycemia: Secondary | ICD-10-CM

## 2020-01-23 DIAGNOSIS — R300 Dysuria: Secondary | ICD-10-CM | POA: Insufficient documentation

## 2020-01-23 LAB — CBC WITH DIFFERENTIAL/PLATELET
Abs Immature Granulocytes: 0.01 10*3/uL (ref 0.00–0.07)
Basophils Absolute: 0 10*3/uL (ref 0.0–0.1)
Basophils Relative: 0 %
Eosinophils Absolute: 0.1 10*3/uL (ref 0.0–0.5)
Eosinophils Relative: 2 %
HCT: 45.2 % (ref 36.0–46.0)
Hemoglobin: 14.8 g/dL (ref 12.0–15.0)
Immature Granulocytes: 0 %
Lymphocytes Relative: 24 %
Lymphs Abs: 1.6 10*3/uL (ref 0.7–4.0)
MCH: 26.7 pg (ref 26.0–34.0)
MCHC: 32.7 g/dL (ref 30.0–36.0)
MCV: 81.4 fL (ref 80.0–100.0)
Monocytes Absolute: 0.3 10*3/uL (ref 0.1–1.0)
Monocytes Relative: 5 %
Neutro Abs: 4.7 10*3/uL (ref 1.7–7.7)
Neutrophils Relative %: 69 %
Platelets: 243 10*3/uL (ref 150–400)
RBC: 5.55 MIL/uL — ABNORMAL HIGH (ref 3.87–5.11)
RDW: 13.3 % (ref 11.5–15.5)
WBC: 6.8 10*3/uL (ref 4.0–10.5)
nRBC: 0 % (ref 0.0–0.2)

## 2020-01-23 LAB — COMPREHENSIVE METABOLIC PANEL
ALT: 43 U/L (ref 0–44)
AST: 26 U/L (ref 15–41)
Albumin: 3.6 g/dL (ref 3.5–5.0)
Alkaline Phosphatase: 110 U/L (ref 38–126)
Anion gap: 7 (ref 5–15)
BUN: 10 mg/dL (ref 6–20)
CO2: 26 mmol/L (ref 22–32)
Calcium: 8.4 mg/dL — ABNORMAL LOW (ref 8.9–10.3)
Chloride: 97 mmol/L — ABNORMAL LOW (ref 98–111)
Creatinine, Ser: 0.66 mg/dL (ref 0.44–1.00)
GFR calc Af Amer: >60 mL/min (ref >60)
GFR calc non Af Amer: >60 mL/min (ref >60)
Glucose, Bld: 325 mg/dL — ABNORMAL HIGH (ref 70–99)
Potassium: 4.4 mmol/L (ref 3.5–5.1)
Sodium: 130 mmol/L — ABNORMAL LOW (ref 135–145)
Total Bilirubin: 0.3 mg/dL (ref 0.3–1.2)
Total Protein: 6.9 g/dL (ref 6.5–8.1)

## 2020-01-23 MED ORDER — ONDANSETRON 4 MG PO TBDP
4.0000 mg | ORAL_TABLET | Freq: Three times a day (TID) | ORAL | 0 refills | Status: DC | PRN
Start: 2020-01-23 — End: 2020-01-23

## 2020-01-23 MED ORDER — PHENAZOPYRIDINE HCL 200 MG PO TABS
200.0000 mg | ORAL_TABLET | Freq: Three times a day (TID) | ORAL | 0 refills | Status: DC | PRN
Start: 2020-01-23 — End: 2020-02-08

## 2020-01-23 NOTE — ED Provider Notes (Signed)
[facility]    CSN: [id number] Arrival date & time: 01/23/20  0940      History   Chief Complaint Chief Complaint  Patient presents with  . Urinary Frequency    HPI Joan [name] is a 39 y.o. female comes to the urgent care with dysuria and some vomiting.  Patient was seen here on Thursday and diagnosed with lower urinary tract infection.   Patient was prescribed nitrofurantoin.  Urine cultures significant for mixed flora.  Patient continues to have dysuria and lower back pain.  She complains of chills and some subjective fever.  She is nauseated and had a vomiting episode yesterday.  HPI  Past Medical History:  Diagnosis Date  . Acute asthma exacerbation 04/10/2018  . Acute non-recurrent maxillary sinusitis 09/25/2018  . Asthma   . Auditory hallucinations    only after anesthesia  . BV (bacterial vaginosis) 02/13/2018  . Diabetes mellitus without complication (HCC)    diet controlled  . Diabetes mellitus, type II (HCC)    insulin, jardiance  . Dyspnea    with exertion  . Essential hypertension   . Essential hypertension 11/24/2017  . GERD (gastroesophageal reflux disease)    occasionally-NO MEDS  . History of placement of ear tubes 05/20/2018  . Hyperlipidemia   . MDD (major depressive disorder)   . Miscarriage   . Nausea & vomiting 10/05/2019  . PTSD (post-traumatic stress disorder)   . Right lower quadrant abdominal pain 02/13/2018  . Tachycardia     Patient Active Problem List   Diagnosis Date Noted  . COVID-19 virus infection 08/16/2019  . Kidney stone 02/11/2019  . Asthma 11/03/2018  . Recurrent sinusitis 05/20/2018  . Recurrent fever of unknown etiology 05/08/2018  . Recurrent URI (upper respiratory infection) 05/08/2018  . Tachycardia 05/04/2018  . Missed period 04/29/2018  . Diabetes mellitus type 2, uncontrolled ([place]) 11/24/2017  . Essential hypertension 11/24/2017  . Hyperlipidemia 11/24/2017  . Major depressive disorder, recurrent, severe  without psychotic features ([place])   . PTSD (post-traumatic stress disorder) 04/11/2015    Past Surgical History:  Procedure Laterality Date  . ABDOMINAL HYSTERECTOMY    . ADENOIDECTOMY    . CHOLECYSTECTOMY  2009  . CYSTOSCOPY N/A 07/31/2018   Procedure: CYSTOSCOPY;  Surgeon: Benjaman Kindler, MD;  Location: [facility];  Service: Gynecology;  Laterality: N/A;  . LAPAROSCOPIC BILATERAL SALPINGECTOMY Bilateral 07/31/2018   Procedure: LAPAROSCOPIC BILATERAL SALPINGECTOMY;  Surgeon: Benjaman Kindler, MD;  Location: [facility];  Service: Gynecology;  Laterality: Bilateral;  . LAPAROSCOPIC HYSTERECTOMY N/A 07/31/2018   Procedure: HYSTERECTOMY TOTAL LAPAROSCOPIC;  Surgeon: Benjaman Kindler, MD;  Location: [facility];  Service: Gynecology;  Laterality: N/A;  . LAPAROSCOPY N/A 06/07/2019   Procedure: LAPAROSCOPY OPERATIVE, WITH PERITONEAL BIOPSIES;  Surgeon: Benjaman Kindler, MD;  Location: [facility];  Service: Gynecology;  Laterality: N/A;  . LYSIS OF ADHESION N/A 07/31/2018   Procedure: LYSIS OF ADHESION;  Surgeon: Benjaman Kindler, MD;  Location: [facility];  Service: Gynecology;  Laterality: N/A;  . OVARY SURGERY Right    cyst removed a while ago  . TONSILLECTOMY    . tubes in ear      OB History   No obstetric history on file.      Home Medications    Prior to Admission medications   Medication Sig Start Date End Date Taking? Authorizing Provider  albuterol (VENTOLIN HFA) 108 (90 Base) MCG/ACT inhaler INHALE 2 PUFFS BY MOUTH EVERY 6 HOURS AS NEEDED FOR SHORTNESS OF BREATH AND WHEEZING  Patient taking differently: Inhale 2 puffs into the lungs every 6 (six) hours as needed for wheezing or shortness of breath. INHALE 2 PUFFS BY MOUTH EVERY 6 HOURS AS NEEDED FOR SHORTNESS OF BREATH AND WHEEZING 10/19/18  Yes Pleas Koch, NP  blood glucose meter kit and supplies KIT Dispense based on patient and insurance preference. Use up three times daily as directed. (FOR ICD-9 250.00, 250.01). 11/24/17  Yes  Pleas Koch, NP  citalopram (CELEXA) 20 MG tablet Take 1 tablet (20 mg total) by mouth daily. For anxiety. 11/09/19  Yes Pleas Koch, NP  Continuous Blood Gluc Receiver (FREESTYLE LIBRE 14 DAY READER) DEVI 1 Device by Does not apply route 3 (three) times daily as needed. 09/03/19  Yes Pleas Koch, NP  Continuous Blood Gluc Sensor (FREESTYLE LIBRE 14 DAY SENSOR) MISC 1 Device by Does not apply route 3 (three) times daily as needed. 09/03/19  Yes Pleas Koch, NP  Dulaglutide (TRULICITY) 1.5 ZP/9.1TA SOPN Inject 1.5 mg into the skin once a week. 09/03/19  Yes Pleas Koch, NP  fluconazole (DIFLUCAN) 150 MG tablet Take 1 tablet (150 mg total) by mouth daily. Take one pill orally, then Repeat in one week as needed. 01/20/20  Yes Marylene Land, NP  insulin aspart (NOVOLOG FLEXPEN) 100 UNIT/ML FlexPen Inject 8 Units into the skin 3 (three) times daily with meals. 09/03/19  Yes Pleas Koch, NP  Insulin Glargine (BASAGLAR KWIKPEN) 100 UNIT/ML SOPN Inject 30 units in the morning and 50 units in the evening for diabetes. 09/17/19  Yes Pleas Koch, NP  Insulin Pen Needle (PEN NEEDLES) 31G X 6 MM MISC Use with insulin as directed. 11/04/18  Yes Pleas Koch, NP  losartan (COZAAR) 25 MG tablet Take 1 tablet (25 mg total) by mouth daily. For blood pressure. 09/03/19  Yes Pleas Koch, NP  montelukast (SINGULAIR) 10 MG tablet Take 10 mg by mouth at bedtime.   Yes [provider]  nitrofurantoin, macrocrystal-monohydrate, (MACROBID) 100 MG capsule Take 1 capsule (100 mg total) by mouth 2 (two) times daily. 01/20/20  Yes Marylene Land, NP  rosuvastatin (CRESTOR) 10 MG tablet Take 1 tablet (10 mg total) by mouth every evening. For cholesterol. 09/06/19  Yes Pleas Koch, NP  phenazopyridine (PYRIDIUM) 200 MG tablet Take 1 tablet (200 mg total) by mouth 3 (three) times daily as needed for pain. 01/23/20   LampteyMyrene Galas, MD    Family History Family  History  Problem Relation Age of Onset  . Asthma Mother   . Diabetes Mother   . Hyperlipidemia Mother   . Hypertension Mother   . Diabetes Father   . Hyperlipidemia Father   . Hypertension Father   . Diabetes Brother   . Depression Brother   . Alcohol abuse Brother   . Depression Maternal Grandmother   . Stroke Paternal Grandmother   . Breast cancer Neg Hx     Social History Social History   Tobacco Use  . Smoking status: Never Smoker  . Smokeless tobacco: Never Used  Vaping Use  . Vaping Use: Never used  Substance Use Topics  . Alcohol use: No  . Drug use: No     Allergies   Ibuprofen, Ciprofloxacin, Metformin and related, Penicillins, Sulfa antibiotics, Other, and Shellfish allergy   Review of Systems Review of Systems  Constitutional: Negative.   Respiratory: Negative for cough and shortness of breath.   Gastrointestinal: Positive for nausea and vomiting. Negative for  diarrhea.  Genitourinary: Positive for dysuria, flank pain, frequency and urgency.  Neurological: Negative for dizziness, numbness and headaches.     Physical Exam Triage Vital Signs ED Triage Vitals  Enc Vitals Group     BP 01/23/20 0958 (!) 134/100     Pulse Rate 01/23/20 0958 93     Resp 01/23/20 0958 18     Temp 01/23/20 0958 98.3 F (36.8 C)     Temp Source 01/23/20 0958 Oral     SpO2 01/23/20 0958 100 %     Weight 01/23/20 0955 129 lb 8 oz (58.7 kg)     Height 01/23/20 0955 '5\' 6"'  (1.676 m)     Head Circumference --      Peak Flow --      Pain Score 01/23/20 0955 9     Pain Loc --      Pain Edu? --      Excl. in [place]? --    No data found.  Updated Vital Signs BP (!) 134/100 (BP Location: Right Arm)   Pulse 93   Temp 98.3 F (36.8 C) (Oral)   Resp 18   Ht '5\' 6"'  (1.676 m)   Wt 58.7 kg   LMP 07/24/2018 (Exact Date) Comment: surgery 07/31/2018  SpO2 100%   BMI 20.90 kg/m   Visual Acuity Right Eye Distance:   Left Eye Distance:   Bilateral Distance:    Right Eye  Near:   Left Eye Near:    Bilateral Near:     Physical Exam Vitals and nursing note reviewed.  Constitutional:      General: She is not in acute distress.    Appearance: She is not ill-appearing.  Cardiovascular:     Rate and Rhythm: Normal rate and regular rhythm.  Pulmonary:     Effort: Pulmonary effort is normal. No respiratory distress.     Breath sounds: No stridor.  Abdominal:     General: Abdomen is flat. There is no distension.     Tenderness: There is no guarding or rebound.  Musculoskeletal:        General: No signs of injury. Normal range of motion.     Comments: Flank tenderness on percussion.  Skin:    General: Skin is warm.     Capillary Refill: Capillary refill takes less than 2 seconds.  Neurological:     General: No focal deficit present.     Mental Status: She is alert.      UC Treatments / Results  Labs (all labs ordered are listed, but only abnormal results are displayed) Labs Reviewed  URINALYSIS, COMPLETE (UACMP) WITH MICROSCOPIC  COMPREHENSIVE METABOLIC PANEL  CBC WITH DIFFERENTIAL/PLATELET    EKG   Radiology No results found.  Procedures Procedures (including critical care time)  Medications Ordered in UC Medications - No data to display  Initial Impression / Assessment and Plan / UC Course  I have reviewed the triage vital signs and the nursing notes.  Pertinent labs & imaging results that were available during my care of the patient were reviewed by me and considered in my medical decision making (see chart for details).     1.  Dysuria with flank tenderness: Urine cultures grew mixed flora Patient continues to take nitrofurantoin Repeat urinalysis CBC, CMP Pyridium prescribed If urinalysis is remarkable, repeat urine cultures will be done.  Patient's only option of antibiotics at this time for urinary tract infection/acute pyelonephritis will be fosfomycin since patient has several allergies. Final Clinical Impressions(s) /  UC Diagnoses   Final diagnoses:  Dysuria   Discharge Instructions   None    ED Prescriptions    Medication Sig Dispense Auth. Provider   phenazopyridine (PYRIDIUM) 200 MG tablet Take 1 tablet (200 mg total) by mouth 3 (three) times daily as needed for pain. 20 tablet Dorsie Sethi, Myrene Galas, MD   ondansetron (ZOFRAN ODT) 4 MG disintegrating tablet  (Status: Discontinued) Take 1 tablet (4 mg total) by mouth every 8 (eight) hours as needed for nausea or vomiting. 20 tablet Jedadiah Abdallah, Myrene Galas, MD     PDMP not reviewed this encounter.   Chase Picket, MD 01/23/20 1022

## 2020-01-23 NOTE — ED Triage Notes (Signed)
Patient complains of dysuria and vomiting. States that she was here on Thursday and prescribed Macrobid and has not experienced any relief. Patient states that we sent off a culture and it didn't show a specific bacteria.

## 2020-01-26 ENCOUNTER — Ambulatory Visit: Payer: Self-pay | Admitting: Primary Care

## 2020-01-26 DIAGNOSIS — Z0289 Encounter for other administrative examinations: Secondary | ICD-10-CM

## 2020-01-30 ENCOUNTER — Other Ambulatory Visit: Payer: Self-pay | Admitting: Primary Care

## 2020-01-30 DIAGNOSIS — E1165 Type 2 diabetes mellitus with hyperglycemia: Secondary | ICD-10-CM

## 2020-02-02 ENCOUNTER — Ambulatory Visit
Admission: EM | Admit: 2020-02-02 | Discharge: 2020-02-02 | Disposition: A | Discharge status: Home / Self Care | Payer: Self-pay | Attending: Family Medicine | Admitting: Family Medicine

## 2020-02-02 ENCOUNTER — Telehealth: Payer: Self-pay | Admitting: Family Medicine

## 2020-02-02 ENCOUNTER — Encounter: Payer: Self-pay | Admitting: Emergency Medicine

## 2020-02-02 DIAGNOSIS — R6883 Chills (without fever): Secondary | ICD-10-CM | POA: Insufficient documentation

## 2020-02-02 DIAGNOSIS — R52 Pain, unspecified: Secondary | ICD-10-CM | POA: Insufficient documentation

## 2020-02-02 DIAGNOSIS — R5383 Other fatigue: Secondary | ICD-10-CM | POA: Insufficient documentation

## 2020-02-02 DIAGNOSIS — J069 Acute upper respiratory infection, unspecified: Secondary | ICD-10-CM

## 2020-02-02 DIAGNOSIS — R059 Cough, unspecified: Secondary | ICD-10-CM

## 2020-02-02 DIAGNOSIS — R05 Cough: Secondary | ICD-10-CM | POA: Insufficient documentation

## 2020-02-02 LAB — POCT RAPID STREP A (OFFICE): Rapid Strep A Screen: NEGATIVE

## 2020-02-02 MED ORDER — FREESTYLE LIBRE 14 DAY READER DEVI: 1.0000 | Freq: Three times a day (TID) | 0 refills | Status: DC | PRN

## 2020-02-02 MED ORDER — BASAGLAR KWIKPEN 100 UNIT/ML ~~LOC~~ SOPN: PEN_INJECTOR | SUBCUTANEOUS | 0 refills | Status: DC

## 2020-02-02 MED ORDER — FLUCONAZOLE 200 MG PO TABS: 200.0000 mg | ORAL_TABLET | Freq: Once | ORAL | 0 refills | Status: AC

## 2020-02-02 MED ORDER — NOVOLOG FLEXPEN 100 UNIT/ML ~~LOC~~ SOPN: 12.0000 [IU] | PEN_INJECTOR | Freq: Three times a day (TID) | SUBCUTANEOUS | 0 refills | Status: DC

## 2020-02-02 MED ORDER — PEN NEEDLES 31G X 6 MM MISC: 0 refills | Status: DC

## 2020-02-02 MED ORDER — FLUCONAZOLE 150 MG PO TABS
ORAL_TABLET | ORAL | 0 refills | Status: DC
Start: 2020-02-02 — End: 2020-02-02

## 2020-02-02 MED ORDER — AZITHROMYCIN 250 MG PO TABS: 250.0000 mg | ORAL_TABLET | Freq: Every day | ORAL | 0 refills | Status: DC

## 2020-02-02 MED ORDER — FREESTYLE LIBRE 14 DAY SENSOR MISC: 1.0000 | Freq: Three times a day (TID) | 2 refills | Status: DC | PRN

## 2020-02-02 MED ORDER — AZITHROMYCIN 250 MG PO TABS
250.0000 mg | ORAL_TABLET | Freq: Every day | ORAL | 0 refills | Status: DC
Start: 2020-02-02 — End: 2020-02-02

## 2020-02-02 NOTE — ED Provider Notes (Signed)
[facility]   [id number] 02/02/20 Arrival Time: 1209   CC: COVID symptoms  SUBJECTIVE: History from: patient.  Joan [name] is a 39 y.o. female who presents with abrupt onset of cough for the last week. Abrupt onset of fatigue, chills, muscle aches, weakness.  Reports that she gets Covid tested at work every week and had a negative test this week.  Reports that she has had both Covid vaccines and has had Covid earlier this year.. Denies sick exposure to COVID, flu or strep. Denies recent travel. Has not taken OTC medications for this.There are no aggravating symptoms. Reports previous symptoms in the past. Denies fever, rhinorrhea, sore throat, SOB, wheezing, chest pain, nausea, changes in bowel or bladder habits.    ROS: As per HPI.  All other pertinent ROS negative.     Past Medical History:  Diagnosis Date  . Acute asthma exacerbation 04/10/2018  . Acute non-recurrent maxillary sinusitis 09/25/2018  . Asthma   . Auditory hallucinations    only after anesthesia  . BV (bacterial vaginosis) 02/13/2018  . Diabetes mellitus without complication (HCC)    diet controlled  . Diabetes mellitus, type II (HCC)    insulin, jardiance  . Dyspnea    with exertion  . Essential hypertension   . Essential hypertension 11/24/2017  . GERD (gastroesophageal reflux disease)    occasionally-NO MEDS  . History of placement of ear tubes 05/20/2018  . Hyperlipidemia   . MDD (major depressive disorder)   . Miscarriage   . Nausea & vomiting 10/05/2019  . PTSD (post-traumatic stress disorder)   . Right lower quadrant abdominal pain 02/13/2018  . Tachycardia    Past Surgical History:  Procedure Laterality Date  . ABDOMINAL HYSTERECTOMY    . ADENOIDECTOMY    . CHOLECYSTECTOMY  2009  . CYSTOSCOPY N/A 07/31/2018   Procedure: CYSTOSCOPY;  Surgeon: Benjaman Kindler, MD;  Location: [facility];  Service: Gynecology;  Laterality: N/A;  . LAPAROSCOPIC BILATERAL SALPINGECTOMY Bilateral 07/31/2018    Procedure: LAPAROSCOPIC BILATERAL SALPINGECTOMY;  Surgeon: Benjaman Kindler, MD;  Location: [facility];  Service: Gynecology;  Laterality: Bilateral;  . LAPAROSCOPIC HYSTERECTOMY N/A 07/31/2018   Procedure: HYSTERECTOMY TOTAL LAPAROSCOPIC;  Surgeon: Benjaman Kindler, MD;  Location: [facility];  Service: Gynecology;  Laterality: N/A;  . LAPAROSCOPY N/A 06/07/2019   Procedure: LAPAROSCOPY OPERATIVE, WITH PERITONEAL BIOPSIES;  Surgeon: Benjaman Kindler, MD;  Location: [facility];  Service: Gynecology;  Laterality: N/A;  . LYSIS OF ADHESION N/A 07/31/2018   Procedure: LYSIS OF ADHESION;  Surgeon: Benjaman Kindler, MD;  Location: [facility];  Service: Gynecology;  Laterality: N/A;  . OVARY SURGERY Right    cyst removed a while ago  . TONSILLECTOMY    . tubes in ear     Allergies  Allergen Reactions  . Ibuprofen Swelling    Facial   . Ciprofloxacin Hives and Rash  . Metformin And Related Other (See Comments)    Elevated Lactic Acid  . Penicillins Hives and Rash    Has patient had a PCN reaction causing immediate rash, facial/tongue/throat swelling, SOB or lightheadedness with hypotension: No Has patient had a PCN reaction causing severe rash involving mucus membranes or skin necrosis: No Has patient had a PCN reaction that required hospitalization: No Has patient had a PCN reaction occurring within the last 10 years: No If all of the above answers are "NO", then may proceed with Cephalosporin use. THE PATIENT IS ABLE TO TOLERATE CEPHALOSPORINS WITHOUT DIFFIC  . Sulfa Antibiotics Hives and Rash  .  Other     ALL NUTS-SCRATCHY THROAT  . Shellfish Allergy Other (See Comments)    ALL SEAFOOD-SCRATCHY THROAT   No current facility-administered medications on file prior to encounter.   Current Outpatient Medications on File Prior to Encounter  Medication Sig Dispense Refill  . albuterol (VENTOLIN HFA) 108 (90 Base) MCG/ACT inhaler INHALE 2 PUFFS BY MOUTH EVERY 6 HOURS AS NEEDED FOR SHORTNESS OF BREATH  AND WHEEZING (Patient taking differently: Inhale 2 puffs into the lungs every 6 (six) hours as needed for wheezing or shortness of breath. INHALE 2 PUFFS BY MOUTH EVERY 6 HOURS AS NEEDED FOR SHORTNESS OF BREATH AND WHEEZING) 18 g 0  . blood glucose meter kit and supplies KIT Dispense based on patient and insurance preference. Use up three times daily as directed. (FOR ICD-9 250.00, 250.01). 1 each 0  . citalopram (CELEXA) 20 MG tablet Take 1 tablet (20 mg total) by mouth daily. For anxiety. 90 tablet 3  . Continuous Blood Gluc Receiver (FREESTYLE LIBRE 14 DAY READER) DEVI 1 Device by Does not apply route 3 (three) times daily as needed. 1 each 0  . Continuous Blood Gluc Sensor (FREESTYLE LIBRE 14 DAY SENSOR) MISC 1 Device by Does not apply route 3 (three) times daily as needed. 2 each 2  . insulin aspart (NOVOLOG FLEXPEN) 100 UNIT/ML FlexPen Inject 8 Units into the skin 3 (three) times daily with meals. 15 mL 3  . Insulin Glargine (BASAGLAR KWIKPEN) 100 UNIT/ML SOPN Inject 30 units in the morning and 50 units in the evening for diabetes. 30 mL 5  . Insulin Pen Needle (PEN NEEDLES) 31G X 6 MM MISC Use with insulin as directed. 100 each 2  . losartan (COZAAR) 25 MG tablet Take 1 tablet (25 mg total) by mouth daily. For blood pressure. 90 tablet 0  . montelukast (SINGULAIR) 10 MG tablet Take 10 mg by mouth at bedtime.    . phenazopyridine (PYRIDIUM) 200 MG tablet Take 1 tablet (200 mg total) by mouth 3 (three) times daily as needed for pain. 20 tablet 0  . rosuvastatin (CRESTOR) 10 MG tablet Take 1 tablet (10 mg total) by mouth every evening. For cholesterol. 90 tablet 3  . TRULICITY 1.5 YD/7.4JO SOPN ADMINISTER 1.5 MG UNDER THE SKIN 1 TIME A WEEK 2 mL 2   Social History   Socioeconomic History  . Marital status: Married    Spouse name: chip  . Number of children: 0  . Years of education: Not on file  . Highest education level: High school graduate  Occupational History  . Occupation: day care  worker  Tobacco Use  . Smoking status: Never Smoker  . Smokeless tobacco: Never Used  Vaping Use  . Vaping Use: Never used  Substance and Sexual Activity  . Alcohol use: No  . Drug use: No  . Sexual activity: Yes  Other Topics Concern  . Not on file  Social History Narrative  . Not on file   Social Determinants of Health   Financial Resource Strain:   . Difficulty of Paying Living Expenses:   Food Insecurity:   . Worried About Charity fundraiser in the Last Year:   . Arboriculturist in the Last Year:   Transportation Needs:   . Film/video editor (Medical):   Marland Kitchen Lack of Transportation (Non-Medical):   Physical Activity:   . Days of Exercise per Week:   . Minutes of Exercise per Session:   Stress:   .  Feeling of Stress :   Social Connections:   . Frequency of Communication with Friends and Family:   . Frequency of Social Gatherings with Friends and Family:   . Attends Religious Services:   . Active Member of Clubs or Organizations:   . Attends Archivist Meetings:   Marland Kitchen Marital Status:   Intimate Partner Violence:   . Fear of Current or Ex-Partner:   . Emotionally Abused:   Marland Kitchen Physically Abused:   . Sexually Abused:    Family History  Problem Relation Age of Onset  . Asthma Mother   . Diabetes Mother   . Hyperlipidemia Mother   . Hypertension Mother   . Diabetes Father   . Hyperlipidemia Father   . Hypertension Father   . Diabetes Brother   . Depression Brother   . Alcohol abuse Brother   . Depression Maternal Grandmother   . Stroke Paternal Grandmother   . Breast cancer Neg Hx     OBJECTIVE:  Vitals:   02/02/20 1217  BP: 132/88  Pulse: 85  Resp: 16  Temp: 98.9 F (37.2 C)  SpO2: 97%     General appearance: alert; appears fatigued, but nontoxic; speaking in full sentences and tolerating own secretions HEENT: NCAT; Ears: EACs clear, TMs pearly gray; Eyes: PERRL.  EOM grossly intact. Sinuses: nontender; Nose: nares patent without  rhinorrhea, Throat: oropharynx clear, tonsils non erythematous or enlarged, uvula midline  Neck: supple without LAD Lungs: unlabored respirations, symmetrical air entry; cough: mild; no respiratory distress; mild wheezing noted to bilateral lung fields, diminished breath sounds in bilateral bases Heart: regular rate and rhythm.  Radial pulses 2+ symmetrical bilaterally Skin: warm and dry Psychological: alert and cooperative; normal mood and affect  LABS:  Results for orders placed or performed during the hospital encounter of 02/02/20 (from the past 24 hour(s))  POCT rapid strep A     Status: None   Collection Time: 02/02/20 12:37 PM  Result Value Ref Range   Rapid Strep A Screen Negative Negative     ASSESSMENT & PLAN:  1. Cough   2. Other fatigue   3. Chills   4. Body aches     Meds ordered this encounter  Medications  . azithromycin (ZITHROMAX) 250 MG tablet    Sig: Take 1 tablet (250 mg total) by mouth daily. Take first 2 tablets together, then 1 every day until finished.    Dispense:  6 tablet    Refill:  0    Order Specific Question:   Supervising Provider    Answer:   Chase Picket [id number]  . fluconazole (DIFLUCAN) 150 MG tablet    Sig: Take one tablet at the onset of symptoms, if still having symptoms in 3 days, take the second tablet.    Dispense:  2 tablet    Refill:  0    Order Specific Question:   Supervising Provider    Answer:   Chase Picket [id number]    Prescribed azithromycin Prescribed fluconazole in case of yeast infection States that she will be able to get chest xray tomorrow Discussed that I would also like her to have steroids as she is still sounding very tight Declines steroids as she states that these run her blood sugar up to high Reports that she has been unable to get her insulin and she does not want to make that worse Discussed that if she begins to have shortness of breath or other concerning symptoms that she needs to  go  directly to the emergency room Get plenty of rest and push fluids Use OTC zyrtec for nasal congestion, runny nose, and/or sore throat Use OTC flonase for nasal congestion and runny nose Use medications daily for symptom relief Use OTC medications like ibuprofen or tylenol as needed fever or pain Call or go to the ED if you have any new or worsening symptoms such as fever, worsening cough, shortness of breath, chest tightness, chest pain, turning blue, changes in mental status.  Reviewed expectations re: course of current medical issues. Questions answered. Outlined signs and symptoms indicating need for more acute intervention. Patient verbalized understanding. After Visit Summary given.         Faustino Congress, NP 02/02/20 1252

## 2020-02-02 NOTE — Telephone Encounter (Signed)
Patient requested that medications be sent to a different pharmacy than discussed in office

## 2020-02-02 NOTE — Discharge Instructions (Addendum)
Treating for upper respiratory infection, possibly pneumonia  Will take a look at chest xray tomorrow  Follow up with this office or with primary care as needed  Go to the ER for trouble swallowing, trouble breathing, extremely high blood sugars, other concerning symptoms

## 2020-02-02 NOTE — ED Notes (Signed)
Patient at [facility] today stated that she woke up this morning with ear ache and sore throat. Covid test done last week which was neg (Rapid test) at work covid injection done on 10/13/19  OTC: tylenol  Denies: fever,headache

## 2020-02-03 ENCOUNTER — Telehealth (HOSPITAL_COMMUNITY): Payer: Self-pay | Admitting: Family Medicine

## 2020-02-03 DIAGNOSIS — R6883 Chills (without fever): Secondary | ICD-10-CM

## 2020-02-03 DIAGNOSIS — R5383 Other fatigue: Secondary | ICD-10-CM

## 2020-02-03 DIAGNOSIS — R059 Cough, unspecified: Secondary | ICD-10-CM

## 2020-02-03 NOTE — Telephone Encounter (Signed)
Put in order for CXR. Previous order was cancelled.

## 2020-02-04 ENCOUNTER — Telehealth: Payer: Self-pay | Admitting: Family Medicine

## 2020-02-04 ENCOUNTER — Ambulatory Visit
Admission: RE | Admit: 2020-02-04 | Discharge: 2020-02-04 | Disposition: A | Discharge status: Home / Self Care | Payer: 59 | Source: Ambulatory Visit | Attending: Family Medicine | Admitting: Family Medicine

## 2020-02-04 ENCOUNTER — Ambulatory Visit: Payer: Self-pay | Admitting: Primary Care

## 2020-02-04 ENCOUNTER — Other Ambulatory Visit: Payer: Self-pay

## 2020-02-04 ENCOUNTER — Telehealth: Payer: Self-pay | Admitting: Emergency Medicine

## 2020-02-04 ENCOUNTER — Ambulatory Visit
Admission: RE | Admit: 2020-02-04 | Discharge: 2020-02-04 | Disposition: A | Discharge status: Home / Self Care | Payer: 59 | Attending: Family Medicine | Admitting: Family Medicine

## 2020-02-04 DIAGNOSIS — R6883 Chills (without fever): Secondary | ICD-10-CM | POA: Diagnosis present

## 2020-02-04 DIAGNOSIS — R5383 Other fatigue: Secondary | ICD-10-CM | POA: Diagnosis present

## 2020-02-04 DIAGNOSIS — R05 Cough: Secondary | ICD-10-CM | POA: Insufficient documentation

## 2020-02-04 LAB — CULTURE, GROUP A STREP (THRC)

## 2020-02-04 NOTE — Telephone Encounter (Signed)
Inform patient of negative chest x-ray results.  Patient reports that she is not feeling any better.  Instructed patient that if she is still feeling poorly tomorrow, to come in the office to be seen in person again.

## 2020-02-04 NOTE — ED Provider Notes (Signed)
Patient called to inquire about chest xray order.  Confirmed that order has been placed and instructed her to go to outpatient xray facility.  She agreed.   Mickie Bail, NP 02/04/20 [phone]

## 2020-02-05 ENCOUNTER — Encounter: Payer: Self-pay | Admitting: Emergency Medicine

## 2020-02-05 ENCOUNTER — Ambulatory Visit
Admission: EM | Admit: 2020-02-05 | Discharge: 2020-02-05 | Disposition: A | Discharge status: Home / Self Care | Payer: 59 | Attending: Emergency Medicine | Admitting: Emergency Medicine

## 2020-02-05 ENCOUNTER — Other Ambulatory Visit: Payer: Self-pay

## 2020-02-05 DIAGNOSIS — J069 Acute upper respiratory infection, unspecified: Secondary | ICD-10-CM

## 2020-02-05 MED ORDER — BENZONATATE 200 MG PO CAPS
ORAL_CAPSULE | ORAL | 0 refills | Status: DC
Start: 2020-02-05 — End: 2020-02-08

## 2020-02-05 MED ORDER — PROMETHAZINE-PHENYLEPHRINE 6.25-5 MG/5ML PO SYRP: 5.0000 mL | ORAL_SOLUTION | Freq: Every evening | ORAL | 0 refills | Status: DC | PRN

## 2020-02-05 NOTE — ED Triage Notes (Signed)
Patient c/o bilateral ear pain that started on Wed.  Patient c/o cough, chest congestion and chest tightness that started on Wed.  Patient states that yesterday she was seen at [facility] and her chest x-ray was negative for pneumonia.  Patient has been on a Z-Pack since Wed.  Patient denies fevers.

## 2020-02-05 NOTE — ED Provider Notes (Addendum)
[facility]    CSN: [id number] Arrival date & time: 02/05/20  0846      History   Chief Complaint Chief Complaint  Patient presents with  . Otalgia  . Cough    HPI Joan [name] is a 39 y.o. female.   HPI  39 year old female presents with bilateral ear pain that started on Wednesday 3 days prior to this visit.  She has had a several day history of cough chest congestion tightness.  I have reviewed her medical records from several different providers.  Yesterday she was seen at urgent care and chest x-ray taken at that time showed no active cardiopulmonary disease.  Placed on a Z-Pak and has been taking it since Wednesday and today is her last dose.  Has had no fever or chills.  States that she has had congestion, ear pain and the cough that has persisted.  She has a history of asthma and has been using albuterol inhaler.  She states that she has body aches and just feels terrible.  She is afebrile today.  Pulse of 88 respirations 15 O2 sats on room air 100%.       Past Medical History:  Diagnosis Date  . Acute asthma exacerbation 04/10/2018  . Acute non-recurrent maxillary sinusitis 09/25/2018  . Asthma   . Auditory hallucinations    only after anesthesia  . BV (bacterial vaginosis) 02/13/2018  . Diabetes mellitus without complication (HCC)    diet controlled  . Diabetes mellitus, type II (HCC)    insulin, jardiance  . Dyspnea    with exertion  . Essential hypertension   . Essential hypertension 11/24/2017  . GERD (gastroesophageal reflux disease)    occasionally-NO MEDS  . History of placement of ear tubes 05/20/2018  . Hyperlipidemia   . MDD (major depressive disorder)   . Miscarriage   . Nausea & vomiting 10/05/2019  . PTSD (post-traumatic stress disorder)   . Right lower quadrant abdominal pain 02/13/2018  . Tachycardia     Patient Active Problem List   Diagnosis Date Noted  . COVID-19 virus infection 08/16/2019  . Kidney stone 02/11/2019  . Asthma  11/03/2018  . Recurrent sinusitis 05/20/2018  . Recurrent fever of unknown etiology 05/08/2018  . Recurrent URI (upper respiratory infection) 05/08/2018  . Tachycardia 05/04/2018  . Missed period 04/29/2018  . Diabetes mellitus type 2, uncontrolled ([place]) 11/24/2017  . Essential hypertension 11/24/2017  . Hyperlipidemia 11/24/2017  . Major depressive disorder, recurrent, severe without psychotic features ([place])   . PTSD (post-traumatic stress disorder) 04/11/2015    Past Surgical History:  Procedure Laterality Date  . ABDOMINAL HYSTERECTOMY    . ADENOIDECTOMY    . CHOLECYSTECTOMY  2009  . CYSTOSCOPY N/A 07/31/2018   Procedure: CYSTOSCOPY;  Surgeon: Benjaman Kindler, MD;  Location: [facility];  Service: Gynecology;  Laterality: N/A;  . LAPAROSCOPIC BILATERAL SALPINGECTOMY Bilateral 07/31/2018   Procedure: LAPAROSCOPIC BILATERAL SALPINGECTOMY;  Surgeon: Benjaman Kindler, MD;  Location: [facility];  Service: Gynecology;  Laterality: Bilateral;  . LAPAROSCOPIC HYSTERECTOMY N/A 07/31/2018   Procedure: HYSTERECTOMY TOTAL LAPAROSCOPIC;  Surgeon: Benjaman Kindler, MD;  Location: [facility];  Service: Gynecology;  Laterality: N/A;  . LAPAROSCOPY N/A 06/07/2019   Procedure: LAPAROSCOPY OPERATIVE, WITH PERITONEAL BIOPSIES;  Surgeon: Benjaman Kindler, MD;  Location: [facility];  Service: Gynecology;  Laterality: N/A;  . LYSIS OF ADHESION N/A 07/31/2018   Procedure: LYSIS OF ADHESION;  Surgeon: Benjaman Kindler, MD;  Location: [facility];  Service: Gynecology;  Laterality: N/A;  .  OVARY SURGERY Right    cyst removed a while ago  . TONSILLECTOMY    . tubes in ear      OB History   No obstetric history on file.      Home Medications    Prior to Admission medications   Medication Sig Start Date End Date Taking? Authorizing Provider  albuterol (VENTOLIN HFA) 108 (90 Base) MCG/ACT inhaler INHALE 2 PUFFS BY MOUTH EVERY 6 HOURS AS NEEDED FOR SHORTNESS OF BREATH AND WHEEZING Patient taking differently:  Inhale 2 puffs into the lungs every 6 (six) hours as needed for wheezing or shortness of breath. INHALE 2 PUFFS BY MOUTH EVERY 6 HOURS AS NEEDED FOR SHORTNESS OF BREATH AND WHEEZING 10/19/18   Pleas Koch, NP  azithromycin (ZITHROMAX) 250 MG tablet Take 1 tablet (250 mg total) by mouth daily. Take first 2 tablets together, then 1 every day until finished. 02/02/20   Faustino Congress, NP  benzonatate (TESSALON) 200 MG capsule Take one cap TID PRN cough 02/05/20   Lorin Picket, PA-C  blood glucose meter kit and supplies KIT Dispense based on patient and insurance preference. Use up three times daily as directed. (FOR ICD-9 250.00, 250.01). 11/24/17   Pleas Koch, NP  citalopram (CELEXA) 20 MG tablet Take 1 tablet (20 mg total) by mouth daily. For anxiety. 11/09/19   Pleas Koch, NP  Continuous Blood Gluc Receiver (FREESTYLE LIBRE 14 DAY READER) DEVI 1 Device by Does not apply route 3 (three) times daily as needed. 02/02/20   Pleas Koch, NP  Continuous Blood Gluc Sensor (FREESTYLE LIBRE 14 DAY SENSOR) MISC 1 Device by Does not apply route 3 (three) times daily as needed. 02/02/20   Pleas Koch, NP  insulin aspart (NOVOLOG FLEXPEN) 100 UNIT/ML FlexPen Inject 12 Units into the skin 3 (three) times daily with meals. 02/02/20   Pleas Koch, NP  Insulin Glargine [facility]) 100 UNIT/ML Inject 50 units in the morning and 50 units in the evening for diabetes. 02/02/20   Pleas Koch, NP  Insulin Pen Needle (PEN NEEDLES) 31G X 6 MM MISC Use with insulin as directed. 02/02/20   Pleas Koch, NP  losartan (COZAAR) 25 MG tablet Take 1 tablet (25 mg total) by mouth daily. For blood pressure. 09/03/19   Pleas Koch, NP  montelukast (SINGULAIR) 10 MG tablet Take 10 mg by mouth at bedtime.    [provider]  phenazopyridine (PYRIDIUM) 200 MG tablet Take 1 tablet (200 mg total) by mouth 3 (three) times daily as needed for pain. 01/23/20   Chase Picket, MD  promethazine-phenylephrine (PROMETHAZINE-PHENYLEPHRINE) 6.25-5 MG/5ML SYRP Take 5 mLs by mouth at bedtime as needed for up to 6 days for congestion. 02/05/20 02/11/20  Lorin Picket, PA-C  rosuvastatin (CRESTOR) 10 MG tablet Take 1 tablet (10 mg total) by mouth every evening. For cholesterol. 09/06/19   Pleas Koch, NP  TRULICITY 1.5 FO/2.7XA SOPN ADMINISTER 1.5 MG UNDER THE SKIN 1 TIME A WEEK 01/31/20   Pleas Koch, NP    Family History Family History  Problem Relation Age of Onset  . Asthma Mother   . Diabetes Mother   . Hyperlipidemia Mother   . Hypertension Mother   . Diabetes Father   . Hyperlipidemia Father   . Hypertension Father   . Diabetes Brother   . Depression Brother   . Alcohol abuse Brother   . Depression Maternal Grandmother   .  Stroke Paternal Grandmother   . Breast cancer Neg Hx     Social History Social History   Tobacco Use  . Smoking status: Never Smoker  . Smokeless tobacco: Never Used  Vaping Use  . Vaping Use: Never used  Substance Use Topics  . Alcohol use: No  . Drug use: No     Allergies   Ibuprofen, Ciprofloxacin, Metformin and related, Penicillins, Sulfa antibiotics, Other, and Shellfish allergy   Review of Systems Review of Systems  Constitutional: Positive for activity change and fatigue. Negative for appetite change, chills, diaphoresis and fever.  HENT: Positive for congestion, ear pain, sinus pressure, sinus pain and voice change.   Respiratory: Positive for cough. Negative for shortness of breath, wheezing and stridor.   All other systems reviewed and are negative.    Physical Exam Triage Vital Signs ED Triage Vitals  Enc Vitals Group     BP 02/05/20 0901 117/87     Pulse Rate 02/05/20 0901 88     Resp 02/05/20 0901 15     Temp 02/05/20 0901 98.1 F (36.7 C)     Temp Source 02/05/20 0901 Oral     SpO2 02/05/20 0901 100 %     Weight 02/05/20 0859 282 lb (127.9 kg)     Height 02/05/20 0859 '5\' 6"'   (1.676 m)     Head Circumference --      Peak Flow --      Pain Score 02/05/20 0858 8     Pain Loc --      Pain Edu? --      Excl. in [place]? --    No data found.  Updated Vital Signs BP 117/87 (BP Location: Left Arm)   Pulse 88   Temp 98.1 F (36.7 C) (Oral)   Resp 15   Ht '5\' 6"'  (1.676 m)   Wt 282 lb (127.9 kg)   LMP 07/24/2018 (Exact Date) Comment: surgery 07/31/2018  SpO2 100%   BMI 45.52 kg/m   Visual Acuity Right Eye Distance:   Left Eye Distance:   Bilateral Distance:    Right Eye Near:   Left Eye Near:    Bilateral Near:     Physical Exam Vitals and nursing note reviewed.  Constitutional:      General: She is not in acute distress.    Appearance: Normal appearance. She is obese. She is not ill-appearing or toxic-appearing.  HENT:     Head: Normocephalic and atraumatic.     Right Ear: Ear canal and external ear normal.     Left Ear: Tympanic membrane, ear canal and external ear normal.     Ears:     Comments: Right TM is mildly sclerotic but no bulging    Nose: Congestion present. No rhinorrhea.     Mouth/Throat:     Mouth: Mucous membranes are moist.     Pharynx: Oropharynx is clear. No oropharyngeal exudate or posterior oropharyngeal erythema.  Eyes:     Conjunctiva/sclera: Conjunctivae normal.  Cardiovascular:     Rate and Rhythm: Normal rate and regular rhythm.     Heart sounds: Normal heart sounds.  Pulmonary:     Effort: Pulmonary effort is normal.     Breath sounds: Normal breath sounds.  Musculoskeletal:        General: Normal range of motion.     Cervical back: Normal range of motion and neck supple.  Skin:    General: Skin is warm and dry.  Neurological:  General: No focal deficit present.     Mental Status: She is alert and oriented to person, place, and time.  Psychiatric:        Mood and Affect: Mood normal.        Behavior: Behavior normal.        Thought Content: Thought content normal.        Judgment: Judgment normal.       UC Treatments / Results  Labs (all labs ordered are listed, but only abnormal results are displayed) Labs Reviewed - No data to display  EKG   Radiology DG Chest 2 View  Result Date: 02/04/2020 CLINICAL DATA:  Cough, shortness of breath, and fatigue for the past 3 days. EXAM: CHEST - 2 VIEW COMPARISON:  Chest x-ray dated September 08, 2019. FINDINGS: The heart size and mediastinal contours are within normal limits. Both lungs are clear. The visualized skeletal structures are unremarkable. IMPRESSION: No active cardiopulmonary disease. Electronically Signed   By: Titus Dubin M.D.   On: 02/04/2020 15:14    Procedures Procedures (including critical care time)  Medications Ordered in UC Medications - No data to display  Initial Impression / Assessment and Plan / UC Course  I have reviewed the triage vital signs and the nursing notes.  Pertinent labs & imaging results that were available during my care of the patient were reviewed by me and considered in my medical decision making (see chart for details).   39 year old female presents with lateral ear pain that started on Wednesday 3 days prior to visit.  She is also been complaining of cough chest congestion chest tightness started on Wednesday.  Yesterday she was seen at urgent care and her chest x-ray was negative for any active cardiopulmonary disease.  She been placed on a Z-Pak since Wednesday and today is her last dose.  She denies any fevers or chills.  Most of her symptoms seem to be upper respiratory.  She does have a cough that is nonproductive.  She has been using albuterol inhaler as necessary.  I reviewed all available medical records.  She likely has an upper respiratory infection.  I told her this is likely viral but since she will complete her Z-Pak days she should the last dose.  Provide her with Ladona Ridgel for her cough and also promethazine for nighttime use.  She should rest as much as possible.  She will  use Tylenol since she is allergic to ibuprofen.  Use her albuterol inhaler as necessary.  If she is worsening or not improving by next week she should follow-up with her primary care physician.  I will get her out of work today and tomorrow and hopefully will be of April return to work on Monday.   Final Clinical Impressions(s) / UC Diagnoses   Final diagnoses:  Upper respiratory tract infection, unspecified type     Discharge Instructions     Get plenty of rest.  Drink lots of fluids.  Use Tylenol for body aches and pains.  Use albuterol inhaler as necessary.  Follow-up with your primary care physician next week if you are not improving.    ED Prescriptions    Medication Sig Dispense Auth. Provider   benzonatate (TESSALON) 200 MG capsule Take one cap TID PRN cough 30 capsule Lorin Picket, PA-C   promethazine-phenylephrine (PROMETHAZINE-PHENYLEPHRINE) 6.25-5 MG/5ML SYRP Take 5 mLs by mouth at bedtime as needed for up to 6 days for congestion. 30 mL Lorin Picket, PA-C  I have reviewed the PDMP during this encounter.   Lorin Picket, PA-C 02/05/20 1004    Lorin Picket, PA-C 02/05/20 1005

## 2020-02-05 NOTE — Discharge Instructions (Signed)
Get plenty of rest.  Drink lots of fluids.  Use Tylenol for body aches and pains.  Use albuterol inhaler as necessary.  Follow-up with your primary care physician next week if you are not improving.

## 2020-02-08 ENCOUNTER — Ambulatory Visit (INDEPENDENT_AMBULATORY_CARE_PROVIDER_SITE_OTHER): Payer: 59 | Admitting: Primary Care

## 2020-02-08 ENCOUNTER — Encounter: Payer: Self-pay | Admitting: Primary Care

## 2020-02-08 ENCOUNTER — Other Ambulatory Visit: Payer: Self-pay

## 2020-02-08 VITALS — BP 136/86 | HR 102 | Temp 95.0°F | Ht 66.0 in | Wt 291.0 lb

## 2020-02-08 DIAGNOSIS — E119 Type 2 diabetes mellitus without complications: Secondary | ICD-10-CM

## 2020-02-08 DIAGNOSIS — E1165 Type 2 diabetes mellitus with hyperglycemia: Secondary | ICD-10-CM | POA: Diagnosis not present

## 2020-02-08 DIAGNOSIS — E785 Hyperlipidemia, unspecified: Secondary | ICD-10-CM

## 2020-02-08 DIAGNOSIS — I10 Essential (primary) hypertension: Secondary | ICD-10-CM | POA: Diagnosis not present

## 2020-02-08 LAB — POCT GLYCOSYLATED HEMOGLOBIN (HGB A1C): Hemoglobin A1C: 13.1 % — AB (ref 4.0–5.6)

## 2020-02-08 MED ORDER — LOSARTAN POTASSIUM 25 MG PO TABS: 25.0000 mg | ORAL_TABLET | Freq: Every day | ORAL | 3 refills | Status: DC

## 2020-02-08 MED ORDER — BASAGLAR KWIKPEN 100 UNIT/ML ~~LOC~~ SOPN: 50.0000 [IU] | PEN_INJECTOR | Freq: Every day | SUBCUTANEOUS | 0 refills | Status: DC

## 2020-02-08 MED ORDER — INSULIN LISPRO (1 UNIT DIAL) 100 UNIT/ML (KWIKPEN): 12.0000 [IU] | PEN_INJECTOR | Freq: Three times a day (TID) | SUBCUTANEOUS | 1 refills | Status: DC

## 2020-02-08 NOTE — Patient Instructions (Signed)
Inject Basaglar 50 units nightly for diabetes.  Inject Humalog 12 units three times daily with meals for diabetes.  Resume Losartan 25 mg for blood pressure and kidney protection. Resume rosuvastatin for cholesterol.  Continue checking your blood sugar levels.  Appropriate times to check your blood sugar levels are:  -Before any meal (breakfast, lunch, dinner) -Two hours after any meal (breakfast, lunch, dinner) -Bedtime  Record your readings and notify me if you continue to consistently run at or above 200 after three weeks.  Please schedule a follow up appointment in 3 months for diabetes check.  It was a pleasure to see you today!   Diabetes Mellitus and Nutrition, Adult When you have diabetes (diabetes mellitus), it is very important to have healthy eating habits because your blood sugar (glucose) levels are greatly affected by what you eat and drink. Eating healthy foods in the appropriate amounts, at about the same times every day, can help you:  Control your blood glucose.  Lower your risk of heart disease.  Improve your blood pressure.  Reach or maintain a healthy weight. Every person with diabetes is different, and each person has different needs for a meal plan. Your health care provider may recommend that you work with a diet and nutrition specialist (dietitian) to make a meal plan that is best for you. Your meal plan may vary depending on factors such as:  The calories you need.  The medicines you take.  Your weight.  Your blood glucose, blood pressure, and cholesterol levels.  Your activity level.  Other health conditions you have, such as heart or kidney disease. How do carbohydrates affect me? Carbohydrates, also called carbs, affect your blood glucose level more than any other type of food. Eating carbs naturally raises the amount of glucose in your blood. Carb counting is a method for keeping track of how many carbs you eat. Counting carbs is important to  keep your blood glucose at a healthy level, especially if you use insulin or take certain oral diabetes medicines. It is important to know how many carbs you can safely have in each meal. This is different for every person. Your dietitian can help you calculate how many carbs you should have at each meal and for each snack. Foods that contain carbs include:  Bread, cereal, rice, pasta, and crackers.  Potatoes and corn.  Peas, beans, and lentils.  Milk and yogurt.  Fruit and juice.  Desserts, such as cakes, cookies, ice cream, and candy. How does alcohol affect me? Alcohol can cause a sudden decrease in blood glucose (hypoglycemia), especially if you use insulin or take certain oral diabetes medicines. Hypoglycemia can be a life-threatening condition. Symptoms of hypoglycemia (sleepiness, dizziness, and confusion) are similar to symptoms of having too much alcohol. If your health care provider says that alcohol is safe for you, follow these guidelines:  Limit alcohol intake to no more than 1 drink per day for nonpregnant women and 2 drinks per day for men. One drink equals 12 oz of beer, 5 oz of wine, or 1 oz of hard liquor.  Do not drink on an empty stomach.  Keep yourself hydrated with water, diet soda, or unsweetened iced tea.  Keep in mind that regular soda, juice, and other mixers may contain a lot of sugar and must be counted as carbs. What are tips for following this plan?  Reading food labels  Start by checking the serving size on the "Nutrition Facts" label of packaged foods and drinks.  The amount of calories, carbs, fats, and other nutrients listed on the label is based on one serving of the item. Many items contain more than one serving per package.  Check the total grams (g) of carbs in one serving. You can calculate the number of servings of carbs in one serving by dividing the total carbs by 15. For example, if a food has 30 g of total carbs, it would be equal to 2  servings of carbs.  Check the number of grams (g) of saturated and trans fats in one serving. Choose foods that have low or no amount of these fats.  Check the number of milligrams (mg) of salt (sodium) in one serving. Most people should limit total sodium intake to less than 2,300 mg per day.  Always check the nutrition information of foods labeled as "low-fat" or "nonfat". These foods may be higher in added sugar or refined carbs and should be avoided.  Talk to your dietitian to identify your daily goals for nutrients listed on the label. Shopping  Avoid buying canned, premade, or processed foods. These foods tend to be high in fat, sodium, and added sugar.  Shop around the outside edge of the grocery store. This includes fresh fruits and vegetables, bulk grains, fresh meats, and fresh dairy. Cooking  Use low-heat cooking methods, such as baking, instead of high-heat cooking methods like deep frying.  Cook using healthy oils, such as olive, canola, or sunflower oil.  Avoid cooking with butter, cream, or high-fat meats. Meal planning  Eat meals and snacks regularly, preferably at the same times every day. Avoid going long periods of time without eating.  Eat foods high in fiber, such as fresh fruits, vegetables, beans, and whole grains. Talk to your dietitian about how many servings of carbs you can eat at each meal.  Eat 4-6 ounces (oz) of lean protein each day, such as lean meat, chicken, fish, eggs, or tofu. One oz of lean protein is equal to: ? 1 oz of meat, chicken, or fish. ? 1 egg. ?  cup of tofu.  Eat some foods each day that contain healthy fats, such as avocado, nuts, seeds, and fish. Lifestyle  Check your blood glucose regularly.  Exercise regularly as told by your health care provider. This may include: ? 150 minutes of moderate-intensity or vigorous-intensity exercise each week. This could be brisk walking, biking, or water aerobics. ? Stretching and doing  strength exercises, such as yoga or weightlifting, at least 2 times a week.  Take medicines as told by your health care provider.  Do not use any products that contain nicotine or tobacco, such as cigarettes and e-cigarettes. If you need help quitting, ask your health care provider.  Work with a Veterinary surgeon or diabetes educator to identify strategies to manage stress and any emotional and social challenges. Questions to ask a health care provider  Do I need to meet with a diabetes educator?  Do I need to meet with a dietitian?  What number can I call if I have questions?  When are the best times to check my blood glucose? Where to find more information:  American Diabetes Association: diabetes.org  Academy of Nutrition and Dietetics: www.eatright.[employer] of Diabetes and Digestive and Kidney Diseases (NIH): CarFlippers.tn Summary  A healthy meal plan will help you control your blood glucose and maintain a healthy lifestyle.  Working with a diet and nutrition specialist (dietitian) can help you make a meal plan that is best  for you.  Keep in mind that carbohydrates (carbs) and alcohol have immediate effects on your blood glucose levels. It is important to count carbs and to use alcohol carefully. This information is not intended to replace advice given to you by your health care provider. Make sure you discuss any questions you have with your health care provider. Document Revised: 07/04/2017 Document Reviewed: 08/26/2016 Elsevier Patient Education  2020 [employer].

## 2020-02-08 NOTE — Progress Notes (Signed)
Subjective:    Patient ID: Joan [name], female    DOB: 08/20/1980, 39 y.o.   MRN: [id number]  HPI  This visit occurred during the SARS-CoV-2 public health emergency.  Safety protocols were in place, including screening questions prior to the visit, additional usage of staff PPE, and extensive cleaning of exam room while observing appropriate contact time as indicated for disinfecting solutions.   Joan [name] is a 39 year old female with a history of hypertension, uncontrolled diabetes, MDD, PTSD who presents today for follow up of diabetes.  Current medications include: Trulicity 1.5 mg, Basaglar 50 units BID, Novolog 12 units TID.   She's been off of her insulin for the last three months. She resumed her Basaglar last week and has been injecting 50 units nightly. She has not yet started her Novolog as her insurance will not cover. She has been compliant to her Trulicity.  She is checking her blood glucose numerous times daily and is getting readings of:  200's to 300's  Last A1C: 11.7 in January 2021, 13.1 today. Last Eye Exam: UTD Last Foot Exam: Due Pneumonia Vaccination: Completed in 2017 ACE/ARB: Losartan, but stopped taking this months ago Statin: Crestor, but stopped taking this months ago  BP Readings from Last 3 Encounters:  02/08/20 136/86  02/05/20 117/87  02/02/20 132/88       Review of Systems  Eyes: Negative for visual disturbance.  Respiratory: Negative for shortness of breath.   Cardiovascular: Negative for chest pain.  Neurological: Negative for dizziness and headaches.       Past Medical History:  Diagnosis Date  . Acute asthma exacerbation 04/10/2018  . Acute non-recurrent maxillary sinusitis 09/25/2018  . Asthma   . Auditory hallucinations    only after anesthesia  . BV (bacterial vaginosis) 02/13/2018  . Diabetes mellitus without complication (HCC)    diet controlled  . Diabetes mellitus, type II (HCC)    insulin, jardiance  . Dyspnea     with exertion  . Essential hypertension   . Essential hypertension 11/24/2017  . GERD (gastroesophageal reflux disease)    occasionally-NO MEDS  . History of placement of ear tubes 05/20/2018  . Hyperlipidemia   . MDD (major depressive disorder)   . Miscarriage   . Nausea & vomiting 10/05/2019  . PTSD (post-traumatic stress disorder)   . Right lower quadrant abdominal pain 02/13/2018  . Tachycardia      Social History   Socioeconomic History  . Marital status: Married    Spouse name: chip  . Number of children: 0  . Years of education: Not on file  . Highest education level: High school graduate  Occupational History  . Occupation: day care worker  Tobacco Use  . Smoking status: Never Smoker  . Smokeless tobacco: Never Used  Vaping Use  . Vaping Use: Never used  Substance and Sexual Activity  . Alcohol use: No  . Drug use: No  . Sexual activity: Yes  Other Topics Concern  . Not on file  Social History Narrative  . Not on file   Social Determinants of Health   Financial Resource Strain:   . Difficulty of Paying Living Expenses:   Food Insecurity:   . Worried About Charity fundraiser in the Last Year:   . Arboriculturist in the Last Year:   Transportation Needs:   . Film/video editor (Medical):   Marland Kitchen Lack of Transportation (Non-Medical):   Physical Activity:   . Days  of Exercise per Week:   . Minutes of Exercise per Session:   Stress:   . Feeling of Stress :   Social Connections:   . Frequency of Communication with Friends and Family:   . Frequency of Social Gatherings with Friends and Family:   . Attends Religious Services:   . Active Member of Clubs or Organizations:   . Attends Archivist Meetings:   Marland Kitchen Marital Status:   Intimate Partner Violence:   . Fear of Current or Ex-Partner:   . Emotionally Abused:   Marland Kitchen Physically Abused:   . Sexually Abused:     Past Surgical History:  Procedure Laterality Date  . ABDOMINAL HYSTERECTOMY    .  ADENOIDECTOMY    . CHOLECYSTECTOMY  2009  . CYSTOSCOPY N/A 07/31/2018   Procedure: CYSTOSCOPY;  Surgeon: Benjaman Kindler, MD;  Location: [facility];  Service: Gynecology;  Laterality: N/A;  . LAPAROSCOPIC BILATERAL SALPINGECTOMY Bilateral 07/31/2018   Procedure: LAPAROSCOPIC BILATERAL SALPINGECTOMY;  Surgeon: Benjaman Kindler, MD;  Location: [facility];  Service: Gynecology;  Laterality: Bilateral;  . LAPAROSCOPIC HYSTERECTOMY N/A 07/31/2018   Procedure: HYSTERECTOMY TOTAL LAPAROSCOPIC;  Surgeon: Benjaman Kindler, MD;  Location: [facility];  Service: Gynecology;  Laterality: N/A;  . LAPAROSCOPY N/A 06/07/2019   Procedure: LAPAROSCOPY OPERATIVE, WITH PERITONEAL BIOPSIES;  Surgeon: Benjaman Kindler, MD;  Location: [facility];  Service: Gynecology;  Laterality: N/A;  . LYSIS OF ADHESION N/A 07/31/2018   Procedure: LYSIS OF ADHESION;  Surgeon: Benjaman Kindler, MD;  Location: [facility];  Service: Gynecology;  Laterality: N/A;  . OVARY SURGERY Right    cyst removed a while ago  . TONSILLECTOMY    . tubes in ear      Family History  Problem Relation Age of Onset  . Asthma Mother   . Diabetes Mother   . Hyperlipidemia Mother   . Hypertension Mother   . Diabetes Father   . Hyperlipidemia Father   . Hypertension Father   . Diabetes Brother   . Depression Brother   . Alcohol abuse Brother   . Depression Maternal Grandmother   . Stroke Paternal Grandmother   . Breast cancer Neg Hx     Allergies  Allergen Reactions  . Ibuprofen Swelling    Facial   . Ciprofloxacin Hives and Rash  . Metformin And Related Other (See Comments)    Elevated Lactic Acid  . Penicillins Hives and Rash    Has patient had a PCN reaction causing immediate rash, facial/tongue/throat swelling, SOB or lightheadedness with hypotension: No Has patient had a PCN reaction causing severe rash involving mucus membranes or skin necrosis: No Has patient had a PCN reaction that required hospitalization: No Has patient had a PCN  reaction occurring within the last 10 years: No If all of the above answers are "NO", then may proceed with Cephalosporin use. THE PATIENT IS ABLE TO TOLERATE CEPHALOSPORINS WITHOUT DIFFIC  . Sulfa Antibiotics Hives and Rash  . Other     ALL NUTS-SCRATCHY THROAT  . Shellfish Allergy Other (See Comments)    ALL SEAFOOD-SCRATCHY THROAT    Current Outpatient Medications on File Prior to Visit  Medication Sig Dispense Refill  . albuterol (VENTOLIN HFA) 108 (90 Base) MCG/ACT inhaler INHALE 2 PUFFS BY MOUTH EVERY 6 HOURS AS NEEDED FOR SHORTNESS OF BREATH AND WHEEZING (Patient taking differently: Inhale 2 puffs into the lungs every 6 (six) hours as needed for wheezing or shortness of breath. INHALE 2 PUFFS BY MOUTH EVERY 6 HOURS AS NEEDED  FOR SHORTNESS OF BREATH AND WHEEZING) 18 g 0  . blood glucose meter kit and supplies KIT Dispense based on patient and insurance preference. Use up three times daily as directed. (FOR ICD-9 250.00, 250.01). 1 each 0  . citalopram (CELEXA) 20 MG tablet Take 1 tablet (20 mg total) by mouth daily. For anxiety. 90 tablet 3  . Continuous Blood Gluc Receiver (FREESTYLE LIBRE 14 DAY READER) DEVI 1 Device by Does not apply route 3 (three) times daily as needed. 1 each 0  . Continuous Blood Gluc Sensor (FREESTYLE LIBRE 14 DAY SENSOR) MISC 1 Device by Does not apply route 3 (three) times daily as needed. 2 each 2  . Insulin Pen Needle (PEN NEEDLES) 31G X 6 MM MISC Use with insulin as directed. 100 each 0  . montelukast (SINGULAIR) 10 MG tablet Take 10 mg by mouth at bedtime.    . rosuvastatin (CRESTOR) 10 MG tablet Take 1 tablet (10 mg total) by mouth every evening. For cholesterol. 90 tablet 3  . TRULICITY 1.5 HF/2.9MS SOPN ADMINISTER 1.5 MG UNDER THE SKIN 1 TIME A WEEK 2 mL 2  . insulin aspart (NOVOLOG FLEXPEN) 100 UNIT/ML FlexPen Inject 12 Units into the skin 3 (three) times daily with meals. (Patient not taking: Reported on 02/08/2020) 15 mL 0   No current  facility-administered medications on file prior to visit.    BP 136/86   Pulse (!) 102   Temp (!) 95 F (35 C) (Temporal)   Ht '5\' 6"'  (1.676 m)   Wt 291 lb (132 kg)   LMP 07/24/2018 (Exact Date) Comment: surgery 07/31/2018  SpO2 98%   BMI 46.97 kg/m    Objective:   Physical Exam Cardiovascular:     Rate and Rhythm: Normal rate and regular rhythm.  Pulmonary:     Effort: Pulmonary effort is normal.     Breath sounds: Normal breath sounds.  Musculoskeletal:     Cervical back: Neck supple.  Skin:    General: Skin is warm and dry.            Assessment & Plan:

## 2020-02-08 NOTE — Assessment & Plan Note (Signed)
Continues to remain uncontrolled which is mostly secondary to medication compliance. She has been off of insulin for 3+ months, also off of losartan and rosuvastatin.   Long discussion today regarding the absolute need for diabetes medication compliance. Continue Basaglar, Rx corrected for 50 units nightly. Rx for Humalog 12 units TID with meals provided, she was on this historically and did well. Continue Trulicity.  Resume losartan and rosuvastatin. She will be switching jobs again and will be without insurance for a period of time. She will be able to return in 3 months, but not sooner. She will update [place] via my chart regarding glucose readings.

## 2020-02-08 NOTE — Assessment & Plan Note (Signed)
Non compliant to rosuvastatin, she will resume. Repeat lipids at next visit.

## 2020-02-08 NOTE — Assessment & Plan Note (Signed)
Above goal today, has been off of losartan for months. Refills provided today, discussed importance of resuming. Check BMP next visit.

## 2020-02-11 ENCOUNTER — Telehealth: Payer: Self-pay | Admitting: Primary Care

## 2020-02-11 DIAGNOSIS — E1165 Type 2 diabetes mellitus with hyperglycemia: Secondary | ICD-10-CM

## 2020-02-11 MED ORDER — LANTUS SOLOSTAR 100 UNIT/ML ~~LOC~~ SOPN: 50.0000 [IU] | PEN_INJECTOR | Freq: Every day | SUBCUTANEOUS | 1 refills | Status: DC

## 2020-02-11 NOTE — Telephone Encounter (Signed)
Please notify patient about this, does she want to make the switch to Lantus now or does she have enough Basaglar to get her through for her next job?

## 2020-02-11 NOTE — Telephone Encounter (Signed)
Spoken and notified patient of Joan [name] comments. Patient stated that to please go ahead and switch. She asked if we can 90 days supply due the new job

## 2020-02-11 NOTE — Telephone Encounter (Signed)
Noted, Rx for Lantus sent to pharmacy.

## 2020-02-11 NOTE — Telephone Encounter (Signed)
Received faxed from [facility] stating that Joan [name] is not cover under patient's insuance plan. Preferred Lantus or Tujeo. Please advise.

## 2020-02-28 ENCOUNTER — Other Ambulatory Visit: Payer: Self-pay | Admitting: Primary Care

## 2020-02-28 DIAGNOSIS — E1165 Type 2 diabetes mellitus with hyperglycemia: Secondary | ICD-10-CM

## 2020-03-03 ENCOUNTER — Ambulatory Visit
Admission: EM | Admit: 2020-03-03 | Discharge: 2020-03-03 | Disposition: A | Discharge status: Home / Self Care | Payer: 59 | Attending: Internal Medicine | Admitting: Internal Medicine

## 2020-03-03 ENCOUNTER — Encounter: Payer: Self-pay | Admitting: Emergency Medicine

## 2020-03-03 ENCOUNTER — Other Ambulatory Visit: Payer: Self-pay

## 2020-03-03 DIAGNOSIS — J029 Acute pharyngitis, unspecified: Secondary | ICD-10-CM | POA: Diagnosis not present

## 2020-03-03 DIAGNOSIS — Z8249 Family history of ischemic heart disease and other diseases of the circulatory system: Secondary | ICD-10-CM | POA: Insufficient documentation

## 2020-03-03 DIAGNOSIS — E785 Hyperlipidemia, unspecified: Secondary | ICD-10-CM | POA: Diagnosis not present

## 2020-03-03 DIAGNOSIS — Z794 Long term (current) use of insulin: Secondary | ICD-10-CM | POA: Insufficient documentation

## 2020-03-03 DIAGNOSIS — B9789 Other viral agents as the cause of diseases classified elsewhere: Secondary | ICD-10-CM | POA: Diagnosis not present

## 2020-03-03 DIAGNOSIS — I10 Essential (primary) hypertension: Secondary | ICD-10-CM | POA: Insufficient documentation

## 2020-03-03 DIAGNOSIS — Z20822 Contact with and (suspected) exposure to covid-19: Secondary | ICD-10-CM | POA: Insufficient documentation

## 2020-03-03 DIAGNOSIS — Z8616 Personal history of COVID-19: Secondary | ICD-10-CM | POA: Insufficient documentation

## 2020-03-03 DIAGNOSIS — Z79899 Other long term (current) drug therapy: Secondary | ICD-10-CM | POA: Insufficient documentation

## 2020-03-03 DIAGNOSIS — K219 Gastro-esophageal reflux disease without esophagitis: Secondary | ICD-10-CM | POA: Diagnosis not present

## 2020-03-03 DIAGNOSIS — F329 Major depressive disorder, single episode, unspecified: Secondary | ICD-10-CM | POA: Diagnosis not present

## 2020-03-03 DIAGNOSIS — J028 Acute pharyngitis due to other specified organisms: Secondary | ICD-10-CM | POA: Insufficient documentation

## 2020-03-03 DIAGNOSIS — E119 Type 2 diabetes mellitus without complications: Secondary | ICD-10-CM | POA: Insufficient documentation

## 2020-03-03 DIAGNOSIS — F4310 Post-traumatic stress disorder, unspecified: Secondary | ICD-10-CM | POA: Insufficient documentation

## 2020-03-03 LAB — MONONUCLEOSIS SCREEN: Mono Screen: NEGATIVE

## 2020-03-03 LAB — SARS CORONAVIRUS 2 (TAT 6-24 HRS): SARS Coronavirus 2: NEGATIVE

## 2020-03-03 MED ORDER — ONDANSETRON 4 MG PO TBDP
4.0000 mg | ORAL_TABLET | Freq: Three times a day (TID) | ORAL | 0 refills | Status: DC | PRN
Start: 2020-03-03 — End: 2020-04-05

## 2020-03-03 MED ORDER — BENZONATATE 100 MG PO CAPS: 100.0000 mg | ORAL_CAPSULE | Freq: Three times a day (TID) | ORAL | 0 refills | Status: DC

## 2020-03-03 NOTE — ED Provider Notes (Signed)
[facility]    CSN: [id number] Arrival date & time: 03/03/20  1112      History   Chief Complaint Chief Complaint  Patient presents with  . Cough    HPI Joan [name] is a 39 y.o. female comes to urgent care with 2 day history of generalized body aches, cough with chest congestion.  Patient is fully vaccinated against COVID-19 virus. She denies any loss of taste or smell. She admits to having generalized fatigue but denies any fever or chills. No nausea vomiting or diarrhea. Patient is diabetic and blood sugars apparently are well controlled. No sick contacts. Patient works in a nursing home.  HPI  Past Medical History:  Diagnosis Date  . Acute asthma exacerbation 04/10/2018  . Acute non-recurrent maxillary sinusitis 09/25/2018  . Asthma   . Auditory hallucinations    only after anesthesia  . BV (bacterial vaginosis) 02/13/2018  . Diabetes mellitus without complication (HCC)    diet controlled  . Diabetes mellitus, type II (HCC)    insulin, jardiance  . Dyspnea    with exertion  . Essential hypertension   . Essential hypertension 11/24/2017  . GERD (gastroesophageal reflux disease)    occasionally-NO MEDS  . History of placement of ear tubes 05/20/2018  . Hyperlipidemia   . MDD (major depressive disorder)   . Miscarriage   . Nausea & vomiting 10/05/2019  . PTSD (post-traumatic stress disorder)   . Right lower quadrant abdominal pain 02/13/2018  . Tachycardia     Patient Active Problem List   Diagnosis Date Noted  . COVID-19 virus infection 08/16/2019  . Kidney stone 02/11/2019  . Asthma 11/03/2018  . Recurrent sinusitis 05/20/2018  . Recurrent fever of unknown etiology 05/08/2018  . Recurrent URI (upper respiratory infection) 05/08/2018  . Tachycardia 05/04/2018  . Missed period 04/29/2018  . Diabetes mellitus type 2, uncontrolled ([place]) 11/24/2017  . Essential hypertension 11/24/2017  . Hyperlipidemia 11/24/2017  . Major depressive disorder,  recurrent, severe without psychotic features ([place])   . PTSD (post-traumatic stress disorder) 04/11/2015    Past Surgical History:  Procedure Laterality Date  . ABDOMINAL HYSTERECTOMY    . ADENOIDECTOMY    . CHOLECYSTECTOMY  2009  . CYSTOSCOPY N/A 07/31/2018   Procedure: CYSTOSCOPY;  Surgeon: Benjaman Kindler, MD;  Location: [facility];  Service: Gynecology;  Laterality: N/A;  . LAPAROSCOPIC BILATERAL SALPINGECTOMY Bilateral 07/31/2018   Procedure: LAPAROSCOPIC BILATERAL SALPINGECTOMY;  Surgeon: Benjaman Kindler, MD;  Location: [facility];  Service: Gynecology;  Laterality: Bilateral;  . LAPAROSCOPIC HYSTERECTOMY N/A 07/31/2018   Procedure: HYSTERECTOMY TOTAL LAPAROSCOPIC;  Surgeon: Benjaman Kindler, MD;  Location: [facility];  Service: Gynecology;  Laterality: N/A;  . LAPAROSCOPY N/A 06/07/2019   Procedure: LAPAROSCOPY OPERATIVE, WITH PERITONEAL BIOPSIES;  Surgeon: Benjaman Kindler, MD;  Location: [facility];  Service: Gynecology;  Laterality: N/A;  . LYSIS OF ADHESION N/A 07/31/2018   Procedure: LYSIS OF ADHESION;  Surgeon: Benjaman Kindler, MD;  Location: [facility];  Service: Gynecology;  Laterality: N/A;  . OVARY SURGERY Right    cyst removed a while ago  . TONSILLECTOMY    . tubes in ear      OB History   No obstetric history on file.      Home Medications    Prior to Admission medications   Medication Sig Start Date End Date Taking? Authorizing Provider  citalopram (CELEXA) 20 MG tablet Take 1 tablet (20 mg total) by mouth daily. For anxiety. 11/09/19  Yes Pleas Koch, NP  insulin glargine (LANTUS SOLOSTAR) 100 UNIT/ML Solostar Pen Inject 50 Units into the skin daily. 02/11/20  Yes Pleas Koch, NP  losartan (COZAAR) 25 MG tablet Take 1 tablet (25 mg total) by mouth daily. For blood pressure. 02/08/20  Yes Pleas Koch, NP  montelukast (SINGULAIR) 10 MG tablet Take 10 mg by mouth at bedtime.   Yes [provider]  rosuvastatin (CRESTOR) 10 MG tablet Take 1  tablet (10 mg total) by mouth every evening. For cholesterol. 09/06/19  Yes Pleas Koch, NP  TRULICITY 1.5 ON/6.2XB SOPN ADMINISTER 1.5 MG UNDER THE SKIN 1 TIME A WEEK 01/31/20  Yes Pleas Koch, NP  albuterol (VENTOLIN HFA) 108 (90 Base) MCG/ACT inhaler INHALE 2 PUFFS BY MOUTH EVERY 6 HOURS AS NEEDED FOR SHORTNESS OF BREATH AND WHEEZING Patient taking differently: Inhale 2 puffs into the lungs every 6 (six) hours as needed for wheezing or shortness of breath. INHALE 2 PUFFS BY MOUTH EVERY 6 HOURS AS NEEDED FOR SHORTNESS OF BREATH AND WHEEZING 10/19/18   Pleas Koch, NP  benzonatate (TESSALON) 100 MG capsule Take 1 capsule (100 mg total) by mouth every 8 (eight) hours. 03/03/20   Langford Carias, Myrene Galas, MD  blood glucose meter kit and supplies KIT Dispense based on patient and insurance preference. Use up three times daily as directed. (FOR ICD-9 250.00, 250.01). 11/24/17   Pleas Koch, NP  Continuous Blood Gluc Receiver (FREESTYLE LIBRE 14 DAY READER) DEVI 1 Device by Does not apply route 3 (three) times daily as needed. 02/02/20   Pleas Koch, NP  Continuous Blood Gluc Sensor (FREESTYLE LIBRE 14 DAY SENSOR) MISC 1 Device by Does not apply route 3 (three) times daily as needed. 02/02/20   Pleas Koch, NP  insulin lispro (HUMALOG KWIKPEN) 100 UNIT/ML KwikPen Inject 0.12 mLs (12 Units total) into the skin 3 (three) times daily. 02/08/20   Pleas Koch, NP  Insulin Pen Needle (PEN NEEDLES) 31G X 6 MM MISC Use with insulin as directed. 02/02/20   Pleas Koch, NP  ondansetron (ZOFRAN ODT) 4 MG disintegrating tablet Take 1 tablet (4 mg total) by mouth every 8 (eight) hours as needed for nausea or vomiting. 03/03/20   Taquanna Borras, Myrene Galas, MD    Family History Family History  Problem Relation Age of Onset  . Asthma Mother   . Diabetes Mother   . Hyperlipidemia Mother   . Hypertension Mother   . Diabetes Father   . Hyperlipidemia Father   . Hypertension Father   .  Diabetes Brother   . Depression Brother   . Alcohol abuse Brother   . Depression Maternal Grandmother   . Stroke Paternal Grandmother   . Breast cancer Neg Hx     Social History Social History   Tobacco Use  . Smoking status: Never Smoker  . Smokeless tobacco: Never Used  Vaping Use  . Vaping Use: Never used  Substance Use Topics  . Alcohol use: No  . Drug use: No     Allergies   Ibuprofen, Ciprofloxacin, Metformin and related, Penicillins, Sulfa antibiotics, Other, and Shellfish allergy   Review of Systems Review of Systems  Constitutional: Positive for activity change and fatigue. Negative for chills and fever.  HENT: Positive for congestion. Negative for sinus pressure, sinus pain, tinnitus and voice change.   Respiratory: Positive for cough and chest tightness. Negative for shortness of breath and wheezing.   Cardiovascular: Negative for chest pain.  Genitourinary: Negative.  Neurological: Negative.      Physical Exam Triage Vital Signs ED Triage Vitals  Enc Vitals Group     BP 03/03/20 1142 (!) 128/88     Pulse Rate 03/03/20 1142 99     Resp 03/03/20 1142 16     Temp 03/03/20 1142 98.3 F (36.8 C)     Temp Source 03/03/20 1142 Oral     SpO2 03/03/20 1142 98 %     Weight 03/03/20 1140 (!) 290 lb (131.5 kg)     Height 03/03/20 1140 '5\' 6"'  (1.676 m)     Head Circumference --      Peak Flow --      Pain Score 03/03/20 1140 8     Pain Loc --      Pain Edu? --      Excl. in [place]? --    No data found.  Updated Vital Signs BP (!) 128/88 (BP Location: Left Arm)   Pulse 99   Temp 98.3 F (36.8 C) (Oral)   Resp 16   Ht '5\' 6"'  (1.676 m)   Wt (!) 131.5 kg   LMP 07/24/2018 (Exact Date) Comment: surgery 07/31/2018  SpO2 98%   BMI 46.81 kg/m   Visual Acuity Right Eye Distance:   Left Eye Distance:   Bilateral Distance:    Right Eye Near:   Left Eye Near:    Bilateral Near:     Physical Exam Vitals and nursing note reviewed.  Constitutional:       General: She is not in acute distress.    Appearance: She is ill-appearing.  HENT:     Right Ear: Tympanic membrane normal.     Left Ear: Tympanic membrane normal.     Mouth/Throat:     Pharynx: No oropharyngeal exudate or posterior oropharyngeal erythema.  Eyes:     Extraocular Movements: Extraocular movements intact.     Pupils: Pupils are equal, round, and reactive to light.  Cardiovascular:     Rate and Rhythm: Normal rate and regular rhythm.     Pulses: Normal pulses.     Heart sounds: Normal heart sounds.  Pulmonary:     Effort: Pulmonary effort is normal. No respiratory distress.     Breath sounds: Normal breath sounds. No wheezing or rhonchi.  Abdominal:     General: Bowel sounds are normal.     Palpations: Abdomen is soft. There is no mass.     Tenderness: There is no abdominal tenderness.     Hernia: No hernia is present.  Neurological:     Mental Status: She is alert.      UC Treatments / Results  Labs (all labs ordered are listed, but only abnormal results are displayed) Labs Reviewed  SARS CORONAVIRUS 2 (TAT 6-24 HRS)  MONONUCLEOSIS SCREEN    EKG   Radiology No results found.  Procedures Procedures (including critical care time)  Medications Ordered in UC Medications - No data to display  Initial Impression / Assessment and Plan / UC Course  I have reviewed the triage vital signs and the nursing notes.  Pertinent labs & imaging results that were available during my care of the patient were reviewed by me and considered in my medical decision making (see chart for details).     1. Acute viral pharyngitis: COVID-19 PCR sent Monosp[place] screen Zofran as needed for nausea vomiting Tessalon Perles as needed for cough Return precautions given If patient is positive for COVID-19 she would need to quarantine per CDC  recommendations. Final Clinical Impressions(s) / UC Diagnoses   Final diagnoses:  Acute viral pharyngitis   Discharge Instructions     None    ED Prescriptions    Medication Sig Dispense Auth. Provider   benzonatate (TESSALON) 100 MG capsule Take 1 capsule (100 mg total) by mouth every 8 (eight) hours. 30 capsule Jakaila Norment, Myrene Galas, MD   ondansetron (ZOFRAN ODT) 4 MG disintegrating tablet Take 1 tablet (4 mg total) by mouth every 8 (eight) hours as needed for nausea or vomiting. 20 tablet Leeann Bady, Myrene Galas, MD     PDMP not reviewed this encounter.   Chase Picket, MD 03/03/20 [phone]

## 2020-03-03 NOTE — ED Triage Notes (Signed)
Patient c/o cough and chest congestion that started on Wed.  Patient denies fevers.  

## 2020-03-16 ENCOUNTER — Ambulatory Visit
Admission: EM | Admit: 2020-03-16 | Discharge: 2020-03-16 | Disposition: A | Discharge status: Home / Self Care | Payer: 59 | Attending: Emergency Medicine | Admitting: Emergency Medicine

## 2020-03-16 ENCOUNTER — Emergency Department
Admission: EM | Admit: 2020-03-16 | Discharge: 2020-03-16 | Disposition: A | Discharge status: Home / Self Care | Payer: 59 | Attending: Emergency Medicine | Admitting: Emergency Medicine

## 2020-03-16 ENCOUNTER — Other Ambulatory Visit: Payer: Self-pay

## 2020-03-16 ENCOUNTER — Encounter: Payer: Self-pay | Admitting: Emergency Medicine

## 2020-03-16 DIAGNOSIS — J45901 Unspecified asthma with (acute) exacerbation: Secondary | ICD-10-CM | POA: Insufficient documentation

## 2020-03-16 DIAGNOSIS — E1165 Type 2 diabetes mellitus with hyperglycemia: Secondary | ICD-10-CM | POA: Insufficient documentation

## 2020-03-16 DIAGNOSIS — Z79899 Other long term (current) drug therapy: Secondary | ICD-10-CM | POA: Insufficient documentation

## 2020-03-16 DIAGNOSIS — R5383 Other fatigue: Secondary | ICD-10-CM | POA: Insufficient documentation

## 2020-03-16 DIAGNOSIS — I10 Essential (primary) hypertension: Secondary | ICD-10-CM | POA: Insufficient documentation

## 2020-03-16 DIAGNOSIS — R81 Glycosuria: Secondary | ICD-10-CM | POA: Diagnosis not present

## 2020-03-16 DIAGNOSIS — R1030 Lower abdominal pain, unspecified: Secondary | ICD-10-CM

## 2020-03-16 DIAGNOSIS — R42 Dizziness and giddiness: Secondary | ICD-10-CM | POA: Diagnosis not present

## 2020-03-16 DIAGNOSIS — E860 Dehydration: Secondary | ICD-10-CM | POA: Diagnosis not present

## 2020-03-16 DIAGNOSIS — R739 Hyperglycemia, unspecified: Secondary | ICD-10-CM | POA: Diagnosis present

## 2020-03-16 DIAGNOSIS — Z794 Long term (current) use of insulin: Secondary | ICD-10-CM | POA: Diagnosis not present

## 2020-03-16 LAB — CBC
HCT: 40.8 % (ref 36.0–46.0)
Hemoglobin: 14 g/dL (ref 12.0–15.0)
MCH: 27.8 pg (ref 26.0–34.0)
MCHC: 34.3 g/dL (ref 30.0–36.0)
MCV: 81.1 fL (ref 80.0–100.0)
Platelets: 300 10*3/uL (ref 150–400)
RBC: 5.03 MIL/uL (ref 3.87–5.11)
RDW: 14.1 % (ref 11.5–15.5)
WBC: 6.1 10*3/uL (ref 4.0–10.5)
nRBC: 0 % (ref 0.0–0.2)

## 2020-03-16 LAB — BASIC METABOLIC PANEL
Anion gap: 13 (ref 5–15)
BUN: 14 mg/dL (ref 6–20)
CO2: 24 mmol/L (ref 22–32)
Calcium: 9.2 mg/dL (ref 8.9–10.3)
Chloride: 97 mmol/L — ABNORMAL LOW (ref 98–111)
Creatinine, Ser: 0.69 mg/dL (ref 0.44–1.00)
GFR calc Af Amer: >60 mL/min (ref >60)
GFR calc non Af Amer: >60 mL/min (ref >60)
Glucose, Bld: 356 mg/dL — ABNORMAL HIGH (ref 70–99)
Potassium: 4.5 mmol/L (ref 3.5–5.1)
Sodium: 134 mmol/L — ABNORMAL LOW (ref 135–145)

## 2020-03-16 LAB — POCT URINALYSIS DIP (MANUAL ENTRY)
Bilirubin, UA: NEGATIVE
Blood, UA: NEGATIVE
Glucose, UA: >=1000 mg/dL — AB
Ketones, POC UA: NEGATIVE mg/dL
Leukocytes, UA: NEGATIVE
Nitrite, UA: NEGATIVE
Protein Ur, POC: NEGATIVE mg/dL
Spec Grav, UA: 1.01 (ref 1.010–1.025)
Urobilinogen, UA: 0.2 E.U./dL
pH, UA: 6 (ref 5.0–8.0)

## 2020-03-16 LAB — URINALYSIS, COMPLETE (UACMP) WITH MICROSCOPIC
Bilirubin Urine: NEGATIVE
Glucose, UA: >=500 mg/dL — AB
Hgb urine dipstick: NEGATIVE
Ketones, ur: NEGATIVE mg/dL
Leukocytes,Ua: NEGATIVE
Nitrite: NEGATIVE
Protein, ur: NEGATIVE mg/dL
Specific Gravity, Urine: 1.033 — ABNORMAL HIGH (ref 1.005–1.030)
pH: 6 (ref 5.0–8.0)

## 2020-03-16 LAB — POCT PREGNANCY, URINE: Preg Test, Ur: NEGATIVE

## 2020-03-16 LAB — GLUCOSE, CAPILLARY
Glucose-Capillary: 300 mg/dL — ABNORMAL HIGH (ref 70–99)
Glucose-Capillary: 199 mg/dL — ABNORMAL HIGH (ref 70–99)

## 2020-03-16 LAB — POCT FASTING CBG KUC MANUAL ENTRY: POCT Glucose (KUC): 452 mg/dL — AB (ref 70–99)

## 2020-03-16 MED ORDER — LACTATED RINGERS IV BOLUS
1000.0000 mL | Freq: Once | INTRAVENOUS | Status: AC
  Administered 2020-03-16: 1000 mL via INTRAVENOUS

## 2020-03-16 MED ORDER — DIPHENHYDRAMINE HCL 50 MG/ML IJ SOLN
25.0000 mg | Freq: Once | INTRAMUSCULAR | Status: AC
  Administered 2020-03-16: 25 mg via INTRAVENOUS
  Filled 2020-03-16: qty 1

## 2020-03-16 MED ORDER — METOCLOPRAMIDE HCL 5 MG/ML IJ SOLN
10.0000 mg | Freq: Once | INTRAMUSCULAR | Status: AC
  Administered 2020-03-16: 10 mg via INTRAVENOUS
  Filled 2020-03-16: qty 2

## 2020-03-16 MED ORDER — INSULIN REGULAR HUMAN 100 UNIT/ML IJ SOLN: 10.0000 [IU] | Freq: Once | INTRAMUSCULAR | Status: DC

## 2020-03-16 MED ORDER — INSULIN ASPART 100 UNIT/ML ~~LOC~~ SOLN
5.0000 [IU] | Freq: Once | SUBCUTANEOUS | Status: AC
  Administered 2020-03-16: 5 [IU] via INTRAVENOUS
  Filled 2020-03-16: qty 1

## 2020-03-16 NOTE — Discharge Instructions (Addendum)
It is very important that you start taking your insulin again.  If you are hesitant to start the 4 times a day, start with the Lantus at night.  Call your primary doctor to discuss your ER visit and arrange appropriate follow-up.  Drink plenty of fluids, having high blood sugar can dehydrate you.

## 2020-03-16 NOTE — ED Triage Notes (Signed)
First Nurse Note:  Sent to ED from Urgent Care for hyperglycemia.  States CBG 485.  AAOx3.  Skin warm and dry. NAD

## 2020-03-16 NOTE — ED Provider Notes (Signed)
[facility] Emergency Department Provider Note  ____________________________________________   First MD Initiated Contact with Patient 03/16/20 1448     (approximate)  I have reviewed the triage vital signs and the nursing notes.   HISTORY  Chief Complaint Hyperglycemia and Dysuria    HPI Joan [name] is a 39 y.o. female  With PMHx HTN, GERD, DM, here with hyperglycemia. Pt reports that she has not been taking her insulin as prescribed. She often runs high but has been running int the 400-500s consistently. 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 She does states she's had some dark urine with mild dysuria. No flank pain. No fever, chills. No other recent illnesses or triggers. She does not increased stress and working lately, as well as not taking her insulin (long or short-acting). No numbness or weakness. No other complaints. No prior hospitalizations for diabetes-related care.        Past Medical History:  Diagnosis Date  . Acute asthma exacerbation 04/10/2018  . Acute non-recurrent maxillary sinusitis 09/25/2018  . Asthma   . Auditory hallucinations    only after anesthesia  . BV (bacterial vaginosis) 02/13/2018  . Diabetes mellitus without complication (HCC)    diet controlled  . Diabetes mellitus, type II (HCC)    insulin, jardiance  . Dyspnea    with exertion  . Essential hypertension   . Essential hypertension 11/24/2017  . GERD (gastroesophageal reflux disease)    occasionally-NO MEDS  . History of placement of ear tubes 05/20/2018  . Hyperlipidemia   . MDD (major depressive disorder)   . Miscarriage   . Nausea & vomiting 10/05/2019  . PTSD (post-traumatic stress disorder)   . Right lower quadrant abdominal pain 02/13/2018  . Tachycardia     Patient Active Problem List   Diagnosis Date Noted  . COVID-19 virus infection 08/16/2019  . Kidney stone 02/11/2019  . Asthma  11/03/2018  . Recurrent sinusitis 05/20/2018  . Recurrent fever of unknown etiology 05/08/2018  . Recurrent URI (upper respiratory infection) 05/08/2018  . Tachycardia 05/04/2018  . Missed period 04/29/2018  . Diabetes mellitus type 2, uncontrolled ([place]) 11/24/2017  . Essential hypertension 11/24/2017  . Hyperlipidemia 11/24/2017  . Major depressive disorder, recurrent, severe without psychotic features ([place])   . PTSD (post-traumatic stress disorder) 04/11/2015    Past Surgical History:  Procedure Laterality Date  . ABDOMINAL HYSTERECTOMY    . ADENOIDECTOMY    . CHOLECYSTECTOMY  2009  . CYSTOSCOPY N/A 07/31/2018   Procedure: CYSTOSCOPY;  Surgeon: Benjaman Kindler, MD;  Location: [facility] ORS;  Service: Gynecology;  Laterality: N/A;  . LAPAROSCOPIC BILATERAL SALPINGECTOMY Bilateral 07/31/2018   Procedure: LAPAROSCOPIC BILATERAL SALPINGECTOMY;  Surgeon: Benjaman Kindler, MD;  Location: [facility] ORS;  Service: Gynecology;  Laterality: Bilateral;  . LAPAROSCOPIC HYSTERECTOMY N/A 07/31/2018   Procedure: HYSTERECTOMY TOTAL LAPAROSCOPIC;  Surgeon: Benjaman Kindler, MD;  Location: [facility] ORS;  Service: Gynecology;  Laterality: N/A;  . LAPAROSCOPY N/A 06/07/2019   Procedure: LAPAROSCOPY OPERATIVE, WITH PERITONEAL BIOPSIES;  Surgeon: Benjaman Kindler, MD;  Location: [facility] ORS;  Service: Gynecology;  Laterality: N/A;  . LYSIS OF ADHESION N/A 07/31/2018   Procedure: LYSIS OF ADHESION;  Surgeon: Benjaman Kindler, MD;  Location: [facility] ORS;  Service: Gynecology;  Laterality: N/A;  . OVARY SURGERY Right    cyst removed a while ago  . TONSILLECTOMY    . tubes in ear      Prior to Admission  medications   Medication Sig Start Date End Date Taking? Authorizing Provider  albuterol (VENTOLIN HFA) 108 (90 Base) MCG/ACT inhaler INHALE 2 PUFFS BY MOUTH EVERY 6 HOURS AS NEEDED FOR SHORTNESS OF BREATH AND WHEEZING Patient taking differently: Inhale 2 puffs into the lungs every 6 (six) hours as needed for wheezing or  shortness of breath. INHALE 2 PUFFS BY MOUTH EVERY 6 HOURS AS NEEDED FOR SHORTNESS OF BREATH AND WHEEZING 10/19/18   Pleas Koch, NP  benzonatate (TESSALON) 100 MG capsule Take 1 capsule (100 mg total) by mouth every 8 (eight) hours. 03/03/20   Lamptey, Myrene Galas, MD  blood glucose meter kit and supplies KIT Dispense based on patient and insurance preference. Use up three times daily as directed. (FOR ICD-9 250.00, 250.01). 11/24/17   Pleas Koch, NP  citalopram (CELEXA) 20 MG tablet Take 1 tablet (20 mg total) by mouth daily. For anxiety. 11/09/19   Pleas Koch, NP  Continuous Blood Gluc Receiver (FREESTYLE LIBRE 14 DAY READER) DEVI 1 Device by Does not apply route 3 (three) times daily as needed. 02/02/20   Pleas Koch, NP  Continuous Blood Gluc Sensor (FREESTYLE LIBRE 14 DAY SENSOR) MISC 1 Device by Does not apply route 3 (three) times daily as needed. 02/02/20   Pleas Koch, NP  insulin glargine (LANTUS SOLOSTAR) 100 UNIT/ML Solostar Pen Inject 50 Units into the skin daily. 02/11/20   Pleas Koch, NP  insulin lispro (HUMALOG KWIKPEN) 100 UNIT/ML KwikPen Inject 0.12 mLs (12 Units total) into the skin 3 (three) times daily. 02/08/20   Pleas Koch, NP  Insulin Pen Needle (PEN NEEDLES) 31G X 6 MM MISC Use with insulin as directed. 02/02/20   Pleas Koch, NP  losartan (COZAAR) 25 MG tablet Take 1 tablet (25 mg total) by mouth daily. For blood pressure. 02/08/20   Pleas Koch, NP  montelukast (SINGULAIR) 10 MG tablet Take 10 mg by mouth at bedtime.    [provider]  ondansetron (ZOFRAN ODT) 4 MG disintegrating tablet Take 1 tablet (4 mg total) by mouth every 8 (eight) hours as needed for nausea or vomiting. 03/03/20   Lamptey, Myrene Galas, MD  rosuvastatin (CRESTOR) 10 MG tablet Take 1 tablet (10 mg total) by mouth every evening. For cholesterol. 09/06/19   Pleas Koch, NP  TRULICITY 1.5 PN/3.0YF SOPN ADMINISTER 1.5 MG UNDER THE SKIN 1 TIME A  WEEK 01/31/20   Pleas Koch, NP    Allergies Ibuprofen, Ciprofloxacin, Metformin and related, Penicillins, Sulfa antibiotics, Other, and Shellfish allergy  Family History  Problem Relation Age of Onset  . Asthma Mother   . Diabetes Mother   . Hyperlipidemia Mother   . Hypertension Mother   . Diabetes Father   . Hyperlipidemia Father   . Hypertension Father   . Diabetes Brother   . Depression Brother   . Alcohol abuse Brother   . Depression Maternal Grandmother   . Stroke Paternal Grandmother   . Breast cancer Neg Hx     Social History Social History   Tobacco Use  . Smoking status: Never Smoker  . Smokeless tobacco: Never Used  Vaping Use  . Vaping Use: Never used  Substance Use Topics  . Alcohol use: No  . Drug use: No    Review of Systems  Review of Systems  Constitutional: Positive for fatigue. Negative for fever.  HENT: Negative for congestion and sore throat.   Eyes: Negative for visual disturbance.  Respiratory: Negative for cough and shortness of breath.   Cardiovascular: Negative for chest pain.  Gastrointestinal: Negative for abdominal pain, diarrhea, nausea and vomiting.  Endocrine: Positive for polydipsia and polyuria.  Genitourinary: Negative for flank pain.  Musculoskeletal: Negative for back pain and neck pain.  Skin: Negative for rash and wound.  Neurological: Positive for light-headedness. Negative for weakness.  All other systems reviewed and are negative.    ____________________________________________  PHYSICAL EXAM:      VITAL SIGNS: ED Triage Vitals  Enc Vitals Group     BP 03/16/20 1324 (!) 143/86     Pulse Rate 03/16/20 1324 (!) 102     Resp 03/16/20 1324 16     Temp 03/16/20 1324 99 F (37.2 C)     Temp Source 03/16/20 1324 Oral     SpO2 03/16/20 1324 98 %     Weight --      Height --      Head Circumference --      Peak Flow --      Pain Score 03/16/20 1325 0     Pain Loc --      Pain Edu? --      Excl. in [place]? --       Physical Exam Vitals and nursing note reviewed.  Constitutional:      General: She is not in acute distress.    Appearance: She is well-developed.  HENT:     Head: Normocephalic and atraumatic.     Mouth/Throat:     Mouth: Mucous membranes are dry.  Eyes:     Conjunctiva/sclera: Conjunctivae normal.  Cardiovascular:     Rate and Rhythm: Regular rhythm. Tachycardia present.     Heart sounds: Normal heart sounds. No murmur heard.  No friction rub.  Pulmonary:     Effort: Pulmonary effort is normal. No respiratory distress.     Breath sounds: Normal breath sounds. No wheezing or rales.  Abdominal:     General: There is no distension.     Palpations: Abdomen is soft.     Tenderness: There is no abdominal tenderness.  Musculoskeletal:     Cervical back: Neck supple.  Skin:    General: Skin is warm.     Capillary Refill: Capillary refill takes less than 2 seconds.  Neurological:     Mental Status: She is alert and oriented to person, place, and time.     Motor: No abnormal muscle tone.       ____________________________________________   LABS (all labs ordered are listed, but only abnormal results are displayed)  Labs Reviewed  BASIC METABOLIC PANEL - Abnormal; Notable for the following components:      Result Value   Sodium 134 (*)    Chloride 97 (*)    Glucose, Bld 356 (*)    All other components within normal limits  URINALYSIS, COMPLETE (UACMP) WITH MICROSCOPIC - Abnormal; Notable for the following components:   Color, Urine YELLOW (*)    APPearance CLEAR (*)    Specific Gravity, Urine 1.033 (*)    Glucose, UA >=500 (*)    Bacteria, UA RARE (*)    All other components within normal limits  GLUCOSE, CAPILLARY - Abnormal; Notable for the following components:   Glucose-Capillary 300 (*)    All other components within normal limits  GLUCOSE, CAPILLARY - Abnormal; Notable for the following components:   Glucose-Capillary 199 (*)    All other components  within normal limits  CBC  POCT PREGNANCY,  URINE  CBG MONITORING, ED  CBG MONITORING, ED  POC URINE PREG, ED  CBG MONITORING, ED    ____________________________________________  EKG: None ________________________________________  RADIOLOGY All imaging, including plain films, CT scans, and ultrasounds, independently reviewed by me, and interpretations confirmed via formal radiology reads.  ED MD interpretation:   None  Official radiology report(s): No results found.  ____________________________________________  PROCEDURES   Procedure(s) performed (including Critical Care):  Procedures  ____________________________________________  INITIAL IMPRESSION / MDM / [place] / ED COURSE  As part of my medical decision making, I reviewed the following data within the [place] notes reviewed and incorporated, Old chart reviewed, Notes from prior ED visits, and [place] Controlled Substance Database       *Joan [name] was evaluated in Emergency Department on 03/16/2020 for the symptoms described in the history of present illness. She was evaluated in the context of the global COVID-19 pandemic, which necessitated consideration that the patient might be at risk for infection with the SARS-CoV-2 virus that causes COVID-19. Institutional protocols and algorithms that pertain to the evaluation of patients at risk for COVID-19 are in a state of rapid change based on information released by regulatory bodies including the CDC and federal and state organizations. These policies and algorithms were followed during the patient's care in the ED.  Some ED evaluations and interventions may be delayed as a result of limited staffing during the pandemic.*     Medical Decision Making:  Pleasant 39 yo F here with hyperglycemia, likely 2/2 medication non-adherence. Labs obtained and reviewed, with no evidence of DKA (no ketonuria, normal CO2 and AG). Renal function  is at baseline. Pt given IVF, insulin with improvement in BG. No apparent infectious or ischemic triggers. Will encourage pt to resume using her insulin at home, and f/u with her PCP. Pt understands risks of untreated diabetes as well as the need and importance of insulin. Return precautions given.   ____________________________________________  FINAL CLINICAL IMPRESSION(S) / ED DIAGNOSES  Final diagnoses:  Hyperglycemia  Dehydration     MEDICATIONS GIVEN DURING THIS VISIT:  Medications  lactated ringers bolus 1,000 mL (0 mLs Intravenous Stopped 03/16/20 1945)  lactated ringers bolus 1,000 mL (0 mLs Intravenous Stopped 03/16/20 1945)  metoCLOPramide (REGLAN) injection 10 mg (10 mg Intravenous Given 03/16/20 1816)  diphenhydrAMINE (BENADRYL) injection 25 mg (25 mg Intravenous Given 03/16/20 1815)  insulin aspart (novoLOG) injection 5 Units (5 Units Intravenous Given 03/16/20 1845)     ED Discharge Orders    None       Note:  This document was prepared using Dragon voice recognition software and may include unintentional dictation errors.   Duffy Bruce, MD 03/16/20 2253

## 2020-03-16 NOTE — ED Triage Notes (Signed)
Patient presents to the ED from Urgent Care for hyperglycemia.  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  Patient went to urgent care today for lower back pain and dysuria and her glucose was very high in her urine so they checked her blood sugar and it was 432.  Patient was instructed to come to the ED.  Patient denies any blurry vision, dizziness or other symptoms.

## 2020-03-16 NOTE — ED Provider Notes (Signed)
Joan [name]    CSN: [id number] Arrival date & time: 03/16/20  1044      History   Chief Complaint Chief Complaint  Patient presents with  . Abdominal Pain  . burning with urination  . Nausea    HPI Joan [name] is a 39 y.o. female.   Patient with type 2 diabetes, hypertension, asthma, presents with dysuria, lower abdominal pain, nausea x5 days.  She denies fever, chills, chest pain, shortness of breath, vomiting, diarrhea, vaginal discharge, pelvic pain, or other symptoms.  Patient states she did not check her blood sugar this morning or give her lispro insulin this morning because she was running late for work.  The history is provided by the patient.    Past Medical History:  Diagnosis Date  . Acute asthma exacerbation 04/10/2018  . Acute non-recurrent maxillary sinusitis 09/25/2018  . Asthma   . Auditory hallucinations    only after anesthesia  . BV (bacterial vaginosis) 02/13/2018  . Diabetes mellitus without complication (HCC)    diet controlled  . Diabetes mellitus, type II (HCC)    insulin, jardiance  . Dyspnea    with exertion  . Essential hypertension   . Essential hypertension 11/24/2017  . GERD (gastroesophageal reflux disease)    occasionally-NO MEDS  . History of placement of ear tubes 05/20/2018  . Hyperlipidemia   . MDD (major depressive disorder)   . Miscarriage   . Nausea & vomiting 10/05/2019  . PTSD (post-traumatic stress disorder)   . Right lower quadrant abdominal pain 02/13/2018  . Tachycardia     Patient Active Problem List   Diagnosis Date Noted  . COVID-19 virus infection 08/16/2019  . Kidney stone 02/11/2019  . Asthma 11/03/2018  . Recurrent sinusitis 05/20/2018  . Recurrent fever of unknown etiology 05/08/2018  . Recurrent URI (upper respiratory infection) 05/08/2018  . Tachycardia 05/04/2018  . Missed period 04/29/2018  . Diabetes mellitus type 2, uncontrolled ([place]) 11/24/2017  . Essential hypertension 11/24/2017  .  Hyperlipidemia 11/24/2017  . Major depressive disorder, recurrent, severe without psychotic features ([place])   . PTSD (post-traumatic stress disorder) 04/11/2015    Past Surgical History:  Procedure Laterality Date  . ABDOMINAL HYSTERECTOMY    . ADENOIDECTOMY    . CHOLECYSTECTOMY  2009  . CYSTOSCOPY N/A 07/31/2018   Procedure: CYSTOSCOPY;  Surgeon: Benjaman Kindler, MD;  Location: [facility] ORS;  Service: Gynecology;  Laterality: N/A;  . LAPAROSCOPIC BILATERAL SALPINGECTOMY Bilateral 07/31/2018   Procedure: LAPAROSCOPIC BILATERAL SALPINGECTOMY;  Surgeon: Benjaman Kindler, MD;  Location: [facility] ORS;  Service: Gynecology;  Laterality: Bilateral;  . LAPAROSCOPIC HYSTERECTOMY N/A 07/31/2018   Procedure: HYSTERECTOMY TOTAL LAPAROSCOPIC;  Surgeon: Benjaman Kindler, MD;  Location: [facility] ORS;  Service: Gynecology;  Laterality: N/A;  . LAPAROSCOPY N/A 06/07/2019   Procedure: LAPAROSCOPY OPERATIVE, WITH PERITONEAL BIOPSIES;  Surgeon: Benjaman Kindler, MD;  Location: [facility] ORS;  Service: Gynecology;  Laterality: N/A;  . LYSIS OF ADHESION N/A 07/31/2018   Procedure: LYSIS OF ADHESION;  Surgeon: Benjaman Kindler, MD;  Location: [facility] ORS;  Service: Gynecology;  Laterality: N/A;  . OVARY SURGERY Right    cyst removed a while ago  . TONSILLECTOMY    . tubes in ear      OB History   No obstetric history on file.      Home Medications    Prior to Admission medications   Medication Sig Start Date End Date Taking? Authorizing Provider  albuterol (VENTOLIN HFA) 108 (90 Base) MCG/ACT inhaler INHALE 2 PUFFS  BY MOUTH EVERY 6 HOURS AS NEEDED FOR SHORTNESS OF BREATH AND WHEEZING Patient taking differently: Inhale 2 puffs into the lungs every 6 (six) hours as needed for wheezing or shortness of breath. INHALE 2 PUFFS BY MOUTH EVERY 6 HOURS AS NEEDED FOR SHORTNESS OF BREATH AND WHEEZING 10/19/18   Pleas Koch, NP  benzonatate (TESSALON) 100 MG capsule Take 1 capsule (100 mg total) by mouth every 8 (eight) hours.  03/03/20   Lamptey, Myrene Galas, MD  blood glucose meter kit and supplies KIT Dispense based on patient and insurance preference. Use up three times daily as directed. (FOR ICD-9 250.00, 250.01). 11/24/17   Pleas Koch, NP  citalopram (CELEXA) 20 MG tablet Take 1 tablet (20 mg total) by mouth daily. For anxiety. 11/09/19   Pleas Koch, NP  Continuous Blood Gluc Receiver (FREESTYLE LIBRE 14 DAY READER) DEVI 1 Device by Does not apply route 3 (three) times daily as needed. 02/02/20   Pleas Koch, NP  Continuous Blood Gluc Sensor (FREESTYLE LIBRE 14 DAY SENSOR) MISC 1 Device by Does not apply route 3 (three) times daily as needed. 02/02/20   Pleas Koch, NP  insulin glargine (LANTUS SOLOSTAR) 100 UNIT/ML Solostar Pen Inject 50 Units into the skin daily. 02/11/20   Pleas Koch, NP  insulin lispro (HUMALOG KWIKPEN) 100 UNIT/ML KwikPen Inject 0.12 mLs (12 Units total) into the skin 3 (three) times daily. 02/08/20   Pleas Koch, NP  Insulin Pen Needle (PEN NEEDLES) 31G X 6 MM MISC Use with insulin as directed. 02/02/20   Pleas Koch, NP  losartan (COZAAR) 25 MG tablet Take 1 tablet (25 mg total) by mouth daily. For blood pressure. 02/08/20   Pleas Koch, NP  montelukast (SINGULAIR) 10 MG tablet Take 10 mg by mouth at bedtime.    [provider]  ondansetron (ZOFRAN ODT) 4 MG disintegrating tablet Take 1 tablet (4 mg total) by mouth every 8 (eight) hours as needed for nausea or vomiting. 03/03/20   Lamptey, Myrene Galas, MD  rosuvastatin (CRESTOR) 10 MG tablet Take 1 tablet (10 mg total) by mouth every evening. For cholesterol. 09/06/19   Pleas Koch, NP  TRULICITY 1.5 BD/5.3GD SOPN ADMINISTER 1.5 MG UNDER THE SKIN 1 TIME A WEEK 01/31/20   Pleas Koch, NP    Family History Family History  Problem Relation Age of Onset  . Asthma Mother   . Diabetes Mother   . Hyperlipidemia Mother   . Hypertension Mother   . Diabetes Father   . Hyperlipidemia  Father   . Hypertension Father   . Diabetes Brother   . Depression Brother   . Alcohol abuse Brother   . Depression Maternal Grandmother   . Stroke Paternal Grandmother   . Breast cancer Neg Hx     Social History Social History   Tobacco Use  . Smoking status: Never Smoker  . Smokeless tobacco: Never Used  Vaping Use  . Vaping Use: Never used  Substance Use Topics  . Alcohol use: No  . Drug use: No     Allergies   Ibuprofen, Ciprofloxacin, Metformin and related, Penicillins, Sulfa antibiotics, Other, and Shellfish allergy   Review of Systems Review of Systems  Constitutional: Negative for chills and fever.  HENT: Negative for ear pain and sore throat.   Eyes: Negative for pain and visual disturbance.  Respiratory: Negative for cough and shortness of breath.   Cardiovascular: Negative for chest pain and palpitations.  Gastrointestinal: Positive for abdominal pain and nausea. Negative for diarrhea and vomiting.  Genitourinary: Positive for dysuria. Negative for hematuria.  Musculoskeletal: Negative for arthralgias and back pain.  Skin: Negative for color change and rash.  Neurological: Negative for seizures and syncope.  All other systems reviewed and are negative.    Physical Exam Triage Vital Signs ED Triage Vitals  Enc Vitals Group     BP 03/16/20 1054 122/86     Pulse Rate 03/16/20 1054 93     Resp 03/16/20 1054 14     Temp 03/16/20 1054 97.8 F (36.6 C)     Temp src --      SpO2 03/16/20 1054 98 %     Weight --      Height --      Head Circumference --      Peak Flow --      Pain Score 03/16/20 1052 9     Pain Loc --      Pain Edu? --      Excl. in [place]? --    No data found.  Updated Vital Signs BP 122/86   Pulse 93   Temp 97.8 F (36.6 C)   Resp 14   LMP 07/24/2018 (Exact Date) Comment: surgery 07/31/2018  SpO2 98%   Visual Acuity Right Eye Distance:   Left Eye Distance:   Bilateral Distance:    Right Eye Near:   Left Eye Near:      Bilateral Near:     Physical Exam Vitals and nursing note reviewed.  Constitutional:      General: She is not in acute distress.    Appearance: She is well-developed. She is obese. She is not ill-appearing.  HENT:     Head: Normocephalic and atraumatic.     Mouth/Throat:     Mouth: Mucous membranes are moist.  Eyes:     Conjunctiva/sclera: Conjunctivae normal.  Cardiovascular:     Rate and Rhythm: Normal rate and regular rhythm.     Heart sounds: No murmur heard.   Pulmonary:     Effort: Pulmonary effort is normal. No respiratory distress.     Breath sounds: Normal breath sounds.  Abdominal:     Palpations: Abdomen is soft.     Tenderness: There is no abdominal tenderness. There is no guarding or rebound.  Musculoskeletal:     Cervical back: Neck supple.  Skin:    General: Skin is warm and dry.     Findings: No rash.  Neurological:     General: No focal deficit present.     Mental Status: She is alert and oriented to person, place, and time.     Gait: Gait normal.  Psychiatric:        Mood and Affect: Mood normal.        Behavior: Behavior normal.      UC Treatments / Results  Labs (all labs ordered are listed, but only abnormal results are displayed) Labs Reviewed  POCT URINALYSIS DIP (MANUAL ENTRY) - Abnormal; Notable for the following components:      Result Value   Glucose, UA >=1,000 (*)    All other components within normal limits  POCT FASTING CBG [facility] MANUAL ENTRY - Abnormal; Notable for the following components:   POCT Glucose ([facility]) 452 (*)    All other components within normal limits    EKG   Radiology No results found.  Procedures Procedures (including critical care time)  Medications Ordered in UC Medications - No  data to display  Initial Impression / Assessment and Plan / UC Course  I have reviewed the triage vital signs and the nursing notes.  Pertinent labs & imaging results that were available during my care of the patient were  reviewed by me and considered in my medical decision making (see chart for details).   Uncontrolled type 2 diabetes with hyperglycemia, glucosuria, lower abdominal pain.  Instructed patient to go to the ED for evaluation of her very high blood sugar with abdominal pain.  Patient agrees to plan of care.  She states she is stable to drive and declines EMS.     Final Clinical Impressions(s) / UC Diagnoses   Final diagnoses:  Uncontrolled type 2 diabetes mellitus with hyperglycemia ([place])  Glucosuria  Lower abdominal pain     Discharge Instructions     Go to the emergency department for evaluation of your very high blood sugar and abdominal pain.           ED Prescriptions    None     PDMP not reviewed this encounter.   Sharion Balloon, NP 03/16/20 1128

## 2020-03-16 NOTE — ED Triage Notes (Signed)
Patient reports she is having burning with urination, lower abdominal pain, and nausea since Sunday. Reports she has not taken her insulin shot this morning.

## 2020-03-16 NOTE — Discharge Instructions (Addendum)
Go to the emergency department for evaluation of your very high blood sugar and abdominal pain.

## 2020-04-02 ENCOUNTER — Encounter: Payer: Self-pay | Admitting: Emergency Medicine

## 2020-04-02 ENCOUNTER — Other Ambulatory Visit: Payer: Self-pay

## 2020-04-02 ENCOUNTER — Ambulatory Visit
Admission: EM | Admit: 2020-04-02 | Discharge: 2020-04-02 | Disposition: A | Discharge status: Home / Self Care | Payer: 59 | Attending: Family Medicine | Admitting: Family Medicine

## 2020-04-02 DIAGNOSIS — I10 Essential (primary) hypertension: Secondary | ICD-10-CM | POA: Insufficient documentation

## 2020-04-02 DIAGNOSIS — Z8616 Personal history of COVID-19: Secondary | ICD-10-CM | POA: Insufficient documentation

## 2020-04-02 DIAGNOSIS — Z20822 Contact with and (suspected) exposure to covid-19: Secondary | ICD-10-CM | POA: Insufficient documentation

## 2020-04-02 DIAGNOSIS — Z794 Long term (current) use of insulin: Secondary | ICD-10-CM | POA: Insufficient documentation

## 2020-04-02 DIAGNOSIS — F4310 Post-traumatic stress disorder, unspecified: Secondary | ICD-10-CM | POA: Insufficient documentation

## 2020-04-02 DIAGNOSIS — E119 Type 2 diabetes mellitus without complications: Secondary | ICD-10-CM | POA: Insufficient documentation

## 2020-04-02 DIAGNOSIS — E785 Hyperlipidemia, unspecified: Secondary | ICD-10-CM | POA: Insufficient documentation

## 2020-04-02 DIAGNOSIS — Z79899 Other long term (current) drug therapy: Secondary | ICD-10-CM | POA: Insufficient documentation

## 2020-04-02 DIAGNOSIS — J069 Acute upper respiratory infection, unspecified: Secondary | ICD-10-CM

## 2020-04-02 DIAGNOSIS — H9209 Otalgia, unspecified ear: Secondary | ICD-10-CM | POA: Insufficient documentation

## 2020-04-02 DIAGNOSIS — J029 Acute pharyngitis, unspecified: Secondary | ICD-10-CM | POA: Insufficient documentation

## 2020-04-02 LAB — GROUP A STREP BY PCR: Group A Strep by PCR: NOT DETECTED

## 2020-04-02 LAB — RAPID INFLUENZA A&B ANTIGENS
Influenza A (ARMC): NEGATIVE
Influenza B (ARMC): NEGATIVE

## 2020-04-02 NOTE — ED Triage Notes (Signed)
Patient c/o sore throat, cough, congestion, bodyaches, chills and fever that started Friday.

## 2020-04-02 NOTE — ED Provider Notes (Signed)
[facility]    CSN: [id number] Arrival date & time: 04/02/20  1144      History   Chief Complaint Chief Complaint  Patient presents with  . Sore Throat  . Headache  . Fever    HPI Joan [name] is a 39 y.o. female.   Patient presents with body aches, fever, chills, congestion, sore throat, ear pain, headache x2 days.  She states she had a negative COVID test at work on 03/31/2020.  She denies difficulty swallowing, cough, shortness of breath, abdominal pain, vomiting, diarrhea, rash, or other concerning symptoms.  Treatment attempted at home with Tylenol.  Patient's medical history includes asthma, diabetes, and hypertension.  The history is provided by the patient and medical records.    Past Medical History:  Diagnosis Date  . Acute asthma exacerbation 04/10/2018  . Acute non-recurrent maxillary sinusitis 09/25/2018  . Asthma   . Auditory hallucinations    only after anesthesia  . BV (bacterial vaginosis) 02/13/2018  . Diabetes mellitus without complication (HCC)    diet controlled  . Diabetes mellitus, type II (HCC)    insulin, jardiance  . Dyspnea    with exertion  . Essential hypertension   . Essential hypertension 11/24/2017  . GERD (gastroesophageal reflux disease)    occasionally-NO MEDS  . History of placement of ear tubes 05/20/2018  . Hyperlipidemia   . MDD (major depressive disorder)   . Miscarriage   . Nausea & vomiting 10/05/2019  . PTSD (post-traumatic stress disorder)   . Right lower quadrant abdominal pain 02/13/2018  . Tachycardia     Patient Active Problem List   Diagnosis Date Noted  . COVID-19 virus infection 08/16/2019  . Kidney stone 02/11/2019  . Asthma 11/03/2018  . Recurrent sinusitis 05/20/2018  . Recurrent fever of unknown etiology 05/08/2018  . Recurrent URI (upper respiratory infection) 05/08/2018  . Tachycardia 05/04/2018  . Missed period 04/29/2018  . Diabetes mellitus type 2, uncontrolled ([place]) 11/24/2017  . Essential  hypertension 11/24/2017  . Hyperlipidemia 11/24/2017  . Major depressive disorder, recurrent, severe without psychotic features ([place])   . PTSD (post-traumatic stress disorder) 04/11/2015    Past Surgical History:  Procedure Laterality Date  . ABDOMINAL HYSTERECTOMY    . ADENOIDECTOMY    . CHOLECYSTECTOMY  2009  . CYSTOSCOPY N/A 07/31/2018   Procedure: CYSTOSCOPY;  Surgeon: Benjaman Kindler, MD;  Location: [facility];  Service: Gynecology;  Laterality: N/A;  . LAPAROSCOPIC BILATERAL SALPINGECTOMY Bilateral 07/31/2018   Procedure: LAPAROSCOPIC BILATERAL SALPINGECTOMY;  Surgeon: Benjaman Kindler, MD;  Location: [facility];  Service: Gynecology;  Laterality: Bilateral;  . LAPAROSCOPIC HYSTERECTOMY N/A 07/31/2018   Procedure: HYSTERECTOMY TOTAL LAPAROSCOPIC;  Surgeon: Benjaman Kindler, MD;  Location: [facility];  Service: Gynecology;  Laterality: N/A;  . LAPAROSCOPY N/A 06/07/2019   Procedure: LAPAROSCOPY OPERATIVE, WITH PERITONEAL BIOPSIES;  Surgeon: Benjaman Kindler, MD;  Location: [facility];  Service: Gynecology;  Laterality: N/A;  . LYSIS OF ADHESION N/A 07/31/2018   Procedure: LYSIS OF ADHESION;  Surgeon: Benjaman Kindler, MD;  Location: [facility];  Service: Gynecology;  Laterality: N/A;  . OVARY SURGERY Right    cyst removed a while ago  . TONSILLECTOMY    . tubes in ear      OB History   No obstetric history on file.      Home Medications    Prior to Admission medications   Medication Sig Start Date End Date Taking? Authorizing Provider  albuterol (VENTOLIN HFA) 108 (90 Base) MCG/ACT inhaler INHALE  2 PUFFS BY MOUTH EVERY 6 HOURS AS NEEDED FOR SHORTNESS OF BREATH AND WHEEZING Patient taking differently: Inhale 2 puffs into the lungs every 6 (six) hours as needed for wheezing or shortness of breath. INHALE 2 PUFFS BY MOUTH EVERY 6 HOURS AS NEEDED FOR SHORTNESS OF BREATH AND WHEEZING 10/19/18  Yes Pleas Koch, NP  citalopram (CELEXA) 20 MG tablet Take 1 tablet (20 mg total) by  mouth daily. For anxiety. 11/09/19  Yes Pleas Koch, NP  insulin glargine (LANTUS SOLOSTAR) 100 UNIT/ML Solostar Pen Inject 50 Units into the skin daily. 02/11/20  Yes Pleas Koch, NP  insulin lispro (HUMALOG KWIKPEN) 100 UNIT/ML KwikPen Inject 0.12 mLs (12 Units total) into the skin 3 (three) times daily. 02/08/20  Yes Pleas Koch, NP  losartan (COZAAR) 25 MG tablet Take 1 tablet (25 mg total) by mouth daily. For blood pressure. 02/08/20  Yes Pleas Koch, NP  rosuvastatin (CRESTOR) 10 MG tablet Take 1 tablet (10 mg total) by mouth every evening. For cholesterol. 09/06/19  Yes Pleas Koch, NP  TRULICITY 1.5 GY/5.6LS SOPN ADMINISTER 1.5 MG UNDER THE SKIN 1 TIME A WEEK 01/31/20  Yes Pleas Koch, NP  benzonatate (TESSALON) 100 MG capsule Take 1 capsule (100 mg total) by mouth every 8 (eight) hours. 03/03/20   Lamptey, Myrene Galas, MD  blood glucose meter kit and supplies KIT Dispense based on patient and insurance preference. Use up three times daily as directed. (FOR ICD-9 250.00, 250.01). 11/24/17   Pleas Koch, NP  Continuous Blood Gluc Receiver (FREESTYLE LIBRE 14 DAY READER) DEVI 1 Device by Does not apply route 3 (three) times daily as needed. 02/02/20   Pleas Koch, NP  Continuous Blood Gluc Sensor (FREESTYLE LIBRE 14 DAY SENSOR) MISC 1 Device by Does not apply route 3 (three) times daily as needed. 02/02/20   Pleas Koch, NP  Insulin Pen Needle (PEN NEEDLES) 31G X 6 MM MISC Use with insulin as directed. 02/02/20   Pleas Koch, NP  montelukast (SINGULAIR) 10 MG tablet Take 10 mg by mouth at bedtime.    [provider]  ondansetron (ZOFRAN ODT) 4 MG disintegrating tablet Take 1 tablet (4 mg total) by mouth every 8 (eight) hours as needed for nausea or vomiting. 03/03/20   Lamptey, Myrene Galas, MD    Family History Family History  Problem Relation Age of Onset  . Asthma Mother   . Diabetes Mother   . Hyperlipidemia Mother   . Hypertension  Mother   . Diabetes Father   . Hyperlipidemia Father   . Hypertension Father   . Diabetes Brother   . Depression Brother   . Alcohol abuse Brother   . Depression Maternal Grandmother   . Stroke Paternal Grandmother   . Breast cancer Neg Hx     Social History Social History   Tobacco Use  . Smoking status: Never Smoker  . Smokeless tobacco: Never Used  Vaping Use  . Vaping Use: Never used  Substance Use Topics  . Alcohol use: No  . Drug use: No     Allergies   Ibuprofen, Ciprofloxacin, Metformin and related, Penicillins, Sulfa antibiotics, Other, and Shellfish allergy   Review of Systems Review of Systems  Constitutional: Positive for chills and fever.  HENT: Positive for congestion, ear pain and sore throat.   Eyes: Negative for pain and visual disturbance.  Respiratory: Negative for cough and shortness of breath.   Cardiovascular: Negative for chest  pain and palpitations.  Gastrointestinal: Negative for abdominal pain, diarrhea and vomiting.  Genitourinary: Negative for dysuria and hematuria.  Musculoskeletal: Negative for arthralgias and back pain.  Skin: Negative for color change and rash.  Neurological: Positive for headaches. Negative for dizziness, seizures, syncope, weakness and numbness.  All other systems reviewed and are negative.    Physical Exam Triage Vital Signs ED Triage Vitals  Enc Vitals Group     BP 04/02/20 1214 137/83     Pulse Rate 04/02/20 1214 (!) 101     Resp 04/02/20 1214 16     Temp 04/02/20 1214 98.5 F (36.9 C)     Temp Source 04/02/20 1214 Oral     SpO2 04/02/20 1214 96 %     Weight 04/02/20 1210 295 lb (133.8 kg)     Height 04/02/20 1210 '5\' 6"'  (1.676 m)     Head Circumference --      Peak Flow --      Pain Score 04/02/20 1210 8     Pain Loc --      Pain Edu? --      Excl. in [place]? --    No data found.  Updated Vital Signs BP 137/83 (BP Location: Left Arm)   Pulse (!) 101   Temp 98.5 F (36.9 C) (Oral)   Resp 16   Ht  '5\' 6"'  (1.676 m)   Wt 295 lb (133.8 kg)   LMP 07/24/2018 (Exact Date) Comment: surgery 07/31/2018  SpO2 96%   BMI 47.61 kg/m   Visual Acuity Right Eye Distance:   Left Eye Distance:   Bilateral Distance:    Right Eye Near:   Left Eye Near:    Bilateral Near:     Physical Exam Vitals and nursing note reviewed.  Constitutional:      General: She is not in acute distress.    Appearance: She is well-developed. She is obese.  HENT:     Head: Normocephalic and atraumatic.     Right Ear: Tympanic membrane normal.     Left Ear: Tympanic membrane normal.     Nose: Nose normal.     Mouth/Throat:     Mouth: Mucous membranes are moist.     Pharynx: Oropharynx is clear.  Eyes:     Conjunctiva/sclera: Conjunctivae normal.  Cardiovascular:     Rate and Rhythm: Normal rate and regular rhythm.     Heart sounds: No murmur heard.   Pulmonary:     Effort: Pulmonary effort is normal. No respiratory distress.     Breath sounds: Normal breath sounds.  Abdominal:     Palpations: Abdomen is soft.     Tenderness: There is no abdominal tenderness. There is no guarding or rebound.  Musculoskeletal:     Cervical back: Neck supple.  Skin:    General: Skin is warm and dry.     Findings: No rash.  Neurological:     General: No focal deficit present.     Mental Status: She is alert and oriented to person, place, and time.     Gait: Gait normal.  Psychiatric:        Mood and Affect: Mood normal.        Behavior: Behavior normal.      UC Treatments / Results  Labs (all labs ordered are listed, but only abnormal results are displayed) Labs Reviewed  GROUP A STREP BY PCR  RAPID INFLUENZA A&B ANTIGENS  SARS CORONAVIRUS 2 (TAT 6-24 HRS)    EKG  Radiology No results found.  Procedures Procedures (including critical care time)  Medications Ordered in UC Medications - No data to display  Initial Impression / Assessment and Plan / UC Course  I have reviewed the triage vital signs  and the nursing notes.  Pertinent labs & imaging results that were available during my care of the patient were reviewed by me and considered in my medical decision making (see chart for details).   URI.  PCR strep negative.  Flu negative.  PCR COVID pending.  Instructed patient to self quarantine until the test result is back.  Discussed symptomatic treatment including Tylenol, Mucinex, rest, hydration.  Instructed patient to follow-up with her PCP if her symptoms or not improving.  Patient agrees to plan of care.   Final Clinical Impressions(s) / UC Diagnoses   Final diagnoses:  Upper respiratory tract infection, unspecified type     Discharge Instructions     Your strep test is negative.  Your flu test is negative.  Your Covid test is pending and the results should be back tomorrow.    Take Tylenol as needed for fever or discomfort.  Take plain Mucinex as needed for congestion.  Rest and keep yourself hydrated.    Follow up with your primary care provider if your symptoms are not improving.         ED Prescriptions    None     PDMP not reviewed this encounter.   Sharion Balloon, NP 04/02/20 1257

## 2020-04-02 NOTE — Discharge Instructions (Signed)
Your strep test is negative.  Your flu test is negative.  Your Covid test is pending and the results should be back tomorrow.    Take Tylenol as needed for fever or discomfort.  Take plain Mucinex as needed for congestion.  Rest and keep yourself hydrated.    Follow up with your primary care provider if your symptoms are not improving.

## 2020-04-03 LAB — SARS CORONAVIRUS 2 (TAT 6-24 HRS): SARS Coronavirus 2: NEGATIVE

## 2020-04-05 ENCOUNTER — Other Ambulatory Visit: Payer: Self-pay

## 2020-04-05 ENCOUNTER — Encounter: Payer: Self-pay | Admitting: Family Medicine

## 2020-04-05 ENCOUNTER — Telehealth (INDEPENDENT_AMBULATORY_CARE_PROVIDER_SITE_OTHER): Payer: Self-pay | Admitting: Family Medicine

## 2020-04-05 DIAGNOSIS — J22 Unspecified acute lower respiratory infection: Secondary | ICD-10-CM

## 2020-04-05 DIAGNOSIS — R110 Nausea: Secondary | ICD-10-CM

## 2020-04-05 MED ORDER — AZITHROMYCIN 250 MG PO TABS: ORAL_TABLET | ORAL | 0 refills | Status: DC

## 2020-04-05 MED ORDER — ONDANSETRON 8 MG PO TBDP: 8.0000 mg | ORAL_TABLET | Freq: Three times a day (TID) | ORAL | 0 refills | Status: DC | PRN

## 2020-04-05 NOTE — Progress Notes (Signed)
Virtual Visit via Video Note  I connected with Joan [name] on 04/05/20 at  2:00 PM EDT by a video enabled telemedicine application and verified that I am speaking with the correct person using two identifiers.  Location: Patient: In her home Provider: [facility] Persons participating in virtual visit: Patient, provider   I discussed the limitations of evaluation and management by telemedicine and the availability of in person appointments. The patient expressed understanding and agreed to proceed.  History of Present Illness: Chief Complaint  Patient presents with  . Headache    x5 days lowgrade fever yesterday 100.8  . Cough  . Nausea  . Sore Throat   This is a 39 year old female who presents today for virtual visit for above chief complaints.  Her symptoms started 6 days ago with headache, nasal congestion, dry cough.  She has had fever of 100-100.8.  She has nausea, fatigue, intermittent shortness of breath without wheeze.  She is fully vaccinated against COVID-19.  She does not have any known sick contacts.  She had 2 recent negative Covid test, 03/31/2020, 04/02/2020.  She went to urgent care 04/02/2020.  She has been taking Tylenol and NyQuil.  She is unable to take NSAIDs. She has been checking her blood sugar numbers.  She was 143 this morning and 205 at lunch prior to her mealtime insulin.  She denies any high or low readings.   Observations/Objective: Patient is alert and answers questions appropriately.  She appears to not feel well.  Looks fatigued.  Visible skin is unremarkable.  Audible nasal congestion, mild hoarseness.  Able to converse in complete sentences.  No audible wheeze or witnessed cough.  Mood and affect are appropriate. LMP 07/24/2018 (Exact Date) Comment: surgery 07/31/2018  Assessment and Plan: 1. Lower respiratory infection -We will cover with antibiotic given duration of symptoms and no improvement in patient with diabetes  mellitus. -Discussed symptom treatment, follow-up/ER precautions - azithromycin (ZITHROMAX) 250 MG tablet; Take 2 tabs PO x 1 dose, then 1 tab PO QD x 4 days  Dispense: 6 tablet; Refill: 0  2. Nausea without vomiting -Encouraged her to take small sips and eat small quantities of bland foods - ondansetron (ZOFRAN-ODT) 8 MG disintegrating tablet; Take 1 tablet (8 mg total) by mouth every 8 (eight) hours as needed for nausea.  Dispense: 15 tablet; Refill: 0   Olean Ree, FNP-BC  [facility], [facility]  04/05/2020 6:02 PM   Follow Up Instructions:    I discussed the assessment and treatment plan with the patient. The patient was provided an opportunity to ask questions and all were answered. The patient agreed with the plan and demonstrated an understanding of the instructions.   The patient was advised to call back or seek an in-person evaluation if the symptoms worsen or if the condition fails to improve as anticipated.   Emi Belfast, FNP

## 2020-04-11 ENCOUNTER — Encounter: Payer: Self-pay | Admitting: Family Medicine

## 2020-05-10 ENCOUNTER — Ambulatory Visit: Payer: 59 | Admitting: Primary Care

## 2020-05-22 ENCOUNTER — Ambulatory Visit
Admission: EM | Admit: 2020-05-22 | Discharge: 2020-05-22 | Disposition: A | Discharge status: Home / Self Care | Payer: Self-pay | Attending: Emergency Medicine | Admitting: Emergency Medicine

## 2020-05-22 ENCOUNTER — Encounter: Payer: Self-pay | Admitting: *Deleted

## 2020-05-22 DIAGNOSIS — J069 Acute upper respiratory infection, unspecified: Secondary | ICD-10-CM

## 2020-05-22 NOTE — ED Provider Notes (Signed)
[name]    CSN: [id number] Arrival date & time: 05/22/20  1247      History   Chief Complaint Chief Complaint  Patient presents with  . Headache  . Cough  . Ear Pain  . Fever    HPI Joan [name] is a 39 y.o. female.   Patient presents with fever, earache, sore throat, headache, nasal congestion, sinus pressure, nonproductive cough since this morning.  T-max 100.1.  OTC treatment attempted.  Patient denies shortness of breath, vomiting, diarrhea, rash, or other symptoms.  Her medical history includes hypertension, diabetes, asthma.  The history is provided by the patient.    Past Medical History:  Diagnosis Date  . Acute asthma exacerbation 04/10/2018  . Acute non-recurrent maxillary sinusitis 09/25/2018  . Asthma   . Auditory hallucinations    only after anesthesia  . BV (bacterial vaginosis) 02/13/2018  . Diabetes mellitus without complication (HCC)    diet controlled  . Diabetes mellitus, type II (HCC)    insulin, jardiance  . Dyspnea    with exertion  . Essential hypertension   . Essential hypertension 11/24/2017  . GERD (gastroesophageal reflux disease)    occasionally-NO MEDS  . History of placement of ear tubes 05/20/2018  . Hyperlipidemia   . MDD (major depressive disorder)   . Miscarriage   . Nausea & vomiting 10/05/2019  . PTSD (post-traumatic stress disorder)   . Right lower quadrant abdominal pain 02/13/2018  . Tachycardia     Patient Active Problem List   Diagnosis Date Noted  . COVID-19 virus infection 08/16/2019  . Kidney stone 02/11/2019  . Asthma 11/03/2018  . Recurrent sinusitis 05/20/2018  . Recurrent fever of unknown etiology 05/08/2018  . Recurrent URI (upper respiratory infection) 05/08/2018  . Tachycardia 05/04/2018  . Missed period 04/29/2018  . Diabetes mellitus type 2, uncontrolled ([place]) 11/24/2017  . Essential hypertension 11/24/2017  . Hyperlipidemia 11/24/2017  . Major depressive disorder, recurrent, severe without  psychotic features ([place])   . PTSD (post-traumatic stress disorder) 04/11/2015    Past Surgical History:  Procedure Laterality Date  . ABDOMINAL HYSTERECTOMY    . ADENOIDECTOMY    . CHOLECYSTECTOMY  2009  . CYSTOSCOPY N/A 07/31/2018   Procedure: CYSTOSCOPY;  Surgeon: Benjaman Kindler, MD;  Location: [facility];  Service: Gynecology;  Laterality: N/A;  . LAPAROSCOPIC BILATERAL SALPINGECTOMY Bilateral 07/31/2018   Procedure: LAPAROSCOPIC BILATERAL SALPINGECTOMY;  Surgeon: Benjaman Kindler, MD;  Location: [facility];  Service: Gynecology;  Laterality: Bilateral;  . LAPAROSCOPIC HYSTERECTOMY N/A 07/31/2018   Procedure: HYSTERECTOMY TOTAL LAPAROSCOPIC;  Surgeon: Benjaman Kindler, MD;  Location: [facility];  Service: Gynecology;  Laterality: N/A;  . LAPAROSCOPY N/A 06/07/2019   Procedure: LAPAROSCOPY OPERATIVE, WITH PERITONEAL BIOPSIES;  Surgeon: Benjaman Kindler, MD;  Location: [facility];  Service: Gynecology;  Laterality: N/A;  . LYSIS OF ADHESION N/A 07/31/2018   Procedure: LYSIS OF ADHESION;  Surgeon: Benjaman Kindler, MD;  Location: [facility];  Service: Gynecology;  Laterality: N/A;  . OVARY SURGERY Right    cyst removed a while ago  . TONSILLECTOMY    . tubes in ear      OB History   No obstetric history on file.      Home Medications    Prior to Admission medications   Medication Sig Start Date End Date Taking? Authorizing Provider  albuterol (VENTOLIN HFA) 108 (90 Base) MCG/ACT inhaler INHALE 2 PUFFS BY MOUTH EVERY 6 HOURS AS NEEDED FOR SHORTNESS OF BREATH AND WHEEZING Patient taking differently:  Inhale 2 puffs into the lungs every 6 (six) hours as needed for wheezing or shortness of breath. INHALE 2 PUFFS BY MOUTH EVERY 6 HOURS AS NEEDED FOR SHORTNESS OF BREATH AND WHEEZING 10/19/18   Pleas Koch, NP  azithromycin (ZITHROMAX) 250 MG tablet Take 2 tabs PO x 1 dose, then 1 tab PO QD x 4 days 04/05/20   Elby Beck, FNP  benzonatate (TESSALON) 100 MG capsule Take 1 capsule (100  mg total) by mouth every 8 (eight) hours. 03/03/20   Lamptey, Myrene Galas, MD  blood glucose meter kit and supplies KIT Dispense based on patient and insurance preference. Use up three times daily as directed. (FOR ICD-9 250.00, 250.01). Patient not taking: Reported on 04/05/2020 11/24/17   Pleas Koch, NP  citalopram (CELEXA) 20 MG tablet Take 1 tablet (20 mg total) by mouth daily. For anxiety. 11/09/19   Pleas Koch, NP  insulin glargine (LANTUS SOLOSTAR) 100 UNIT/ML Solostar Pen Inject 50 Units into the skin daily. 02/11/20   Pleas Koch, NP  insulin lispro (HUMALOG KWIKPEN) 100 UNIT/ML KwikPen Inject 0.12 mLs (12 Units total) into the skin 3 (three) times daily. 02/08/20   Pleas Koch, NP  Insulin Pen Needle (PEN NEEDLES) 31G X 6 MM MISC Use with insulin as directed. 02/02/20   Pleas Koch, NP  losartan (COZAAR) 25 MG tablet Take 1 tablet (25 mg total) by mouth daily. For blood pressure. 02/08/20   Pleas Koch, NP  montelukast (SINGULAIR) 10 MG tablet Take 10 mg by mouth at bedtime. Patient not taking: Reported on 04/05/2020    [provider]  ondansetron (ZOFRAN-ODT) 8 MG disintegrating tablet Take 1 tablet (8 mg total) by mouth every 8 (eight) hours as needed for nausea. 04/05/20   Elby Beck, FNP  rosuvastatin (CRESTOR) 10 MG tablet Take 1 tablet (10 mg total) by mouth every evening. For cholesterol. 09/06/19   Pleas Koch, NP  TRULICITY 1.5 HY/8.5OY SOPN ADMINISTER 1.5 MG UNDER THE SKIN 1 TIME A WEEK 01/31/20   Pleas Koch, NP    Family History Family History  Problem Relation Age of Onset  . Asthma Mother   . Diabetes Mother   . Hyperlipidemia Mother   . Hypertension Mother   . Diabetes Father   . Hyperlipidemia Father   . Hypertension Father   . Diabetes Brother   . Depression Brother   . Alcohol abuse Brother   . Depression Maternal Grandmother   . Stroke Paternal Grandmother   . Breast cancer Neg Hx     Social  History Social History   Tobacco Use  . Smoking status: Never Smoker  . Smokeless tobacco: Never Used  Vaping Use  . Vaping Use: Never used  Substance Use Topics  . Alcohol use: No  . Drug use: No     Allergies   Ibuprofen, Ciprofloxacin, Metformin and related, Penicillins, Sulfa antibiotics, Other, and Shellfish allergy   Review of Systems Review of Systems  Constitutional: Positive for fever. Negative for chills.  HENT: Positive for congestion, ear pain, sinus pressure and sore throat.   Eyes: Negative for pain and visual disturbance.  Respiratory: Positive for cough. Negative for shortness of breath.   Cardiovascular: Negative for chest pain and palpitations.  Gastrointestinal: Negative for abdominal pain, diarrhea and vomiting.  Genitourinary: Negative for dysuria and hematuria.  Musculoskeletal: Negative for arthralgias and back pain.  Skin: Negative for color change and rash.  Neurological: Positive for  headaches. Negative for dizziness, seizures, syncope, weakness and numbness.  All other systems reviewed and are negative.    Physical Exam Triage Vital Signs ED Triage Vitals  Enc Vitals Group     BP 05/22/20 1440 136/87     Pulse Rate 05/22/20 1440 95     Resp 05/22/20 1440 18     Temp 05/22/20 1440 98.4 F (36.9 C)     Temp Source 05/22/20 1440 Oral     SpO2 05/22/20 1440 97 %     Weight --      Height --      Head Circumference --      Peak Flow --      Pain Score 05/22/20 1447 4     Pain Loc --      Pain Edu? --      Excl. in [place]? --    No data found.  Updated Vital Signs BP 136/87 (BP Location: Left Arm)   Pulse 95   Temp 98.4 F (36.9 C) (Oral)   Resp 18   LMP 07/24/2018 (Exact Date) Comment: surgery 07/31/2018  SpO2 97%   Visual Acuity Right Eye Distance:   Left Eye Distance:   Bilateral Distance:    Right Eye Near:   Left Eye Near:    Bilateral Near:     Physical Exam Vitals and nursing note reviewed.  Constitutional:       General: She is not in acute distress.    Appearance: She is well-developed. She is obese. She is not ill-appearing.  HENT:     Head: Normocephalic and atraumatic.     Right Ear: Tympanic membrane normal.     Left Ear: Tympanic membrane normal.     Nose: Nose normal.     Mouth/Throat:     Mouth: Mucous membranes are moist.     Pharynx: Oropharynx is clear.  Eyes:     Conjunctiva/sclera: Conjunctivae normal.  Cardiovascular:     Rate and Rhythm: Normal rate and regular rhythm.     Heart sounds: No murmur heard.   Pulmonary:     Effort: Pulmonary effort is normal. No respiratory distress.     Breath sounds: Normal breath sounds. No wheezing or rhonchi.  Abdominal:     Palpations: Abdomen is soft.     Tenderness: There is no abdominal tenderness. There is no guarding or rebound.  Musculoskeletal:     Cervical back: Neck supple.  Skin:    General: Skin is warm and dry.     Findings: No rash.  Neurological:     General: No focal deficit present.     Mental Status: She is alert and oriented to person, place, and time.     Gait: Gait normal.  Psychiatric:        Mood and Affect: Mood normal.        Behavior: Behavior normal.      UC Treatments / Results  Labs (all labs ordered are listed, but only abnormal results are displayed) Labs Reviewed  COVID-19, FLU A+B AND RSV    EKG   Radiology No results found.  Procedures Procedures (including critical care time)  Medications Ordered in UC Medications - No data to display  Initial Impression / Assessment and Plan / UC Course  I have reviewed the triage vital signs and the nursing notes.  Pertinent labs & imaging results that were available during my care of the patient were reviewed by me and considered in my medical decision making (  see chart for details).   Viral URI.  PCR COVID/Influenza/RSV pending.  Instructed patient to self quarantine until the test result is back.  Discussed symptomatic treatment including  Tylenol, rest, hydration.  Instructed patient to go to the ED if she has acute worsening symptoms.  Patient agrees to plan of care.    Final Clinical Impressions(s) / UC Diagnoses   Final diagnoses:  Viral URI     Discharge Instructions     Your COVID/Influenza/RSV test is pending.  You should self quarantine until the test result is back.    Take Tylenol as needed for fever or discomfort.  Rest and keep yourself hydrated.    Go to the emergency department if you develop acute worsening symptoms.         ED Prescriptions    None     PDMP not reviewed this encounter.   Sharion Balloon, NP 05/22/20 [phone]

## 2020-05-22 NOTE — ED Triage Notes (Signed)
Patient repots sore throat, headache, nasal congestion, cough, ear pain and fever since this am. Endorses facial and forehead and pressure.

## 2020-05-22 NOTE — Discharge Instructions (Signed)
Your COVID/Influenza/RSV test is pending.  You should self quarantine until the test result is back.    Take Tylenol as needed for fever or discomfort.  Rest and keep yourself hydrated.    Go to the emergency department if you develop acute worsening symptoms.

## 2020-05-23 LAB — COVID-19, FLU A+B AND RSV
Influenza A, NAA: NOT DETECTED
Influenza B, NAA: NOT DETECTED
RSV, NAA: NOT DETECTED
SARS-CoV-2, NAA: NOT DETECTED

## 2020-06-28 ENCOUNTER — Telehealth: Payer: Self-pay | Admitting: Primary Care

## 2020-06-28 ENCOUNTER — Ambulatory Visit
Admission: EM | Admit: 2020-06-28 | Discharge: 2020-06-28 | Disposition: A | Discharge status: Home / Self Care | Payer: Self-pay | Attending: Emergency Medicine | Admitting: Emergency Medicine

## 2020-06-28 ENCOUNTER — Other Ambulatory Visit: Payer: Self-pay

## 2020-06-28 DIAGNOSIS — H66003 Acute suppurative otitis media without spontaneous rupture of ear drum, bilateral: Secondary | ICD-10-CM | POA: Insufficient documentation

## 2020-06-28 DIAGNOSIS — Z20822 Contact with and (suspected) exposure to covid-19: Secondary | ICD-10-CM | POA: Insufficient documentation

## 2020-06-28 DIAGNOSIS — J069 Acute upper respiratory infection, unspecified: Secondary | ICD-10-CM | POA: Insufficient documentation

## 2020-06-28 LAB — RESP PANEL BY RT-PCR (FLU A&B, COVID) ARPGX2
Influenza A by PCR: NEGATIVE
Influenza B by PCR: NEGATIVE
SARS Coronavirus 2 by RT PCR: NEGATIVE

## 2020-06-28 MED ORDER — PROMETHAZINE-DM 6.25-15 MG/5ML PO SYRP
5.0000 mL | ORAL_SOLUTION | Freq: Four times a day (QID) | ORAL | 0 refills | Status: DC | PRN
Start: 2020-06-28 — End: 2020-07-26

## 2020-06-28 MED ORDER — CLARITHROMYCIN 500 MG PO TABS: 500.0000 mg | ORAL_TABLET | Freq: Two times a day (BID) | ORAL | 0 refills | Status: AC

## 2020-06-28 NOTE — ED Triage Notes (Signed)
Patient states that she has cough, headache,  Ear pain and fever since yesterday. States that PCP would like her checked for flu.

## 2020-06-28 NOTE — Telephone Encounter (Signed)
Access nurse called in stating patient has covid/ flu symptoms and is asthmatic and diabetic. No complications at this time and no test done. Wanting to see if needing to be seen ASAP or advise to ER.

## 2020-06-28 NOTE — Telephone Encounter (Signed)
I spoke with pt; pt used inhaler that helped slightly. Pt started getting sick on 06/27/20; pt fever is 99.9 but took nyquil and that had tylenol in it per pt. Pt is wheezing and sounds very congested in chest when coughs; pt is aching all over; pt said she feels very sick and thinks she may have the flu. pts husband will take pt to [facility] in [place] this afternoon when he gets off work; [facility] ED precautions given.pt will call 911 if condition worsens where she cannot get her breath prior to husband getting home. Pt does not have CP or SOB but is trying to rest. FYI to Allayne Gitelman NP.

## 2020-06-28 NOTE — Discharge Instructions (Signed)
Take the clarithromycin twice daily with food for 10 days.  Take an over-the-counter probiotic 1 hour after each dose of antibiotic to prevent diarrhea.  Use the Promethazine DM as needed for cough and congestion.  Use over-the-counter Tylenol and ibuprofen as needed for pain and fever.  Follow-up with your primary care provider for new or worsening symptoms.

## 2020-06-28 NOTE — Telephone Encounter (Signed)
If we have availability this afternoon for Covid and flu testing we can swab her. We will need to do a virtual visit prior to or after this.

## 2020-06-28 NOTE — ED Provider Notes (Signed)
[facility]    CSN: [id number] Arrival date & time: 06/28/20  1808      History   Chief Complaint Chief Complaint  Patient presents with  . Cough    HPI Joan [name] is a 39 y.o. female.   HPI   39 year old female here for evaluation of cough, headache, ear pain and fever.  Patient reports that her symptoms started yesterday.  Patient has also had some shortness of breath and wheezing but has a history of asthma and has been needing to use her inhaler more frequently.  Patient has had some sinus pressure in her cheeks and a mildly sore throat.  Patient denies changes to her sense of taste or smell, ear discharge, changes to her sense of taste or smell, or sick contacts.  Patient does work in a long-term care facility  Past Medical History:  Diagnosis Date  . Acute asthma exacerbation 04/10/2018  . Acute non-recurrent maxillary sinusitis 09/25/2018  . Asthma   . Auditory hallucinations    only after anesthesia  . BV (bacterial vaginosis) 02/13/2018  . Diabetes mellitus without complication (HCC)    diet controlled  . Diabetes mellitus, type II (HCC)    insulin, jardiance  . Dyspnea    with exertion  . Essential hypertension   . Essential hypertension 11/24/2017  . GERD (gastroesophageal reflux disease)    occasionally-NO MEDS  . History of placement of ear tubes 05/20/2018  . Hyperlipidemia   . MDD (major depressive disorder)   . Miscarriage   . Nausea & vomiting 10/05/2019  . PTSD (post-traumatic stress disorder)   . Right lower quadrant abdominal pain 02/13/2018  . Tachycardia     Patient Active Problem List   Diagnosis Date Noted  . COVID-19 virus infection 08/16/2019  . Kidney stone 02/11/2019  . Asthma 11/03/2018  . Recurrent sinusitis 05/20/2018  . Recurrent fever of unknown etiology 05/08/2018  . Recurrent URI (upper respiratory infection) 05/08/2018  . Tachycardia 05/04/2018  . Missed period 04/29/2018  . Diabetes mellitus type 2,  uncontrolled ([place]) 11/24/2017  . Essential hypertension 11/24/2017  . Hyperlipidemia 11/24/2017  . Major depressive disorder, recurrent, severe without psychotic features ([place])   . PTSD (post-traumatic stress disorder) 04/11/2015    Past Surgical History:  Procedure Laterality Date  . ABDOMINAL HYSTERECTOMY    . ADENOIDECTOMY    . CHOLECYSTECTOMY  2009  . CYSTOSCOPY N/A 07/31/2018   Procedure: CYSTOSCOPY;  Surgeon: Benjaman Kindler, MD;  Location: [facility];  Service: Gynecology;  Laterality: N/A;  . LAPAROSCOPIC BILATERAL SALPINGECTOMY Bilateral 07/31/2018   Procedure: LAPAROSCOPIC BILATERAL SALPINGECTOMY;  Surgeon: Benjaman Kindler, MD;  Location: [facility];  Service: Gynecology;  Laterality: Bilateral;  . LAPAROSCOPIC HYSTERECTOMY N/A 07/31/2018   Procedure: HYSTERECTOMY TOTAL LAPAROSCOPIC;  Surgeon: Benjaman Kindler, MD;  Location: [facility];  Service: Gynecology;  Laterality: N/A;  . LAPAROSCOPY N/A 06/07/2019   Procedure: LAPAROSCOPY OPERATIVE, WITH PERITONEAL BIOPSIES;  Surgeon: Benjaman Kindler, MD;  Location: [facility];  Service: Gynecology;  Laterality: N/A;  . LYSIS OF ADHESION N/A 07/31/2018   Procedure: LYSIS OF ADHESION;  Surgeon: Benjaman Kindler, MD;  Location: [facility];  Service: Gynecology;  Laterality: N/A;  . OVARY SURGERY Right    cyst removed a while ago  . TONSILLECTOMY    . tubes in ear      OB History   No obstetric history on file.      Home Medications    Prior to Admission medications   Medication Sig Start  Date End Date Taking? Authorizing Provider  albuterol (VENTOLIN HFA) 108 (90 Base) MCG/ACT inhaler INHALE 2 PUFFS BY MOUTH EVERY 6 HOURS AS NEEDED FOR SHORTNESS OF BREATH AND WHEEZING Patient taking differently: Inhale 2 puffs into the lungs every 6 (six) hours as needed for wheezing or shortness of breath. INHALE 2 PUFFS BY MOUTH EVERY 6 HOURS AS NEEDED FOR SHORTNESS OF BREATH AND WHEEZING 10/19/18  Yes Pleas Koch, NP  benzonatate (TESSALON)  100 MG capsule Take 1 capsule (100 mg total) by mouth every 8 (eight) hours. 03/03/20  Yes Lamptey, Myrene Galas, MD  blood glucose meter kit and supplies KIT Dispense based on patient and insurance preference. Use up three times daily as directed. (FOR ICD-9 250.00, 250.01). 11/24/17  Yes Pleas Koch, NP  citalopram (CELEXA) 20 MG tablet Take 1 tablet (20 mg total) by mouth daily. For anxiety. 11/09/19  Yes Pleas Koch, NP  insulin glargine (LANTUS SOLOSTAR) 100 UNIT/ML Solostar Pen Inject 50 Units into the skin daily. 02/11/20  Yes Pleas Koch, NP  insulin lispro (HUMALOG KWIKPEN) 100 UNIT/ML KwikPen Inject 0.12 mLs (12 Units total) into the skin 3 (three) times daily. 02/08/20  Yes Pleas Koch, NP  Insulin Pen Needle (PEN NEEDLES) 31G X 6 MM MISC Use with insulin as directed. 02/02/20  Yes Pleas Koch, NP  losartan (COZAAR) 25 MG tablet Take 1 tablet (25 mg total) by mouth daily. For blood pressure. 02/08/20  Yes Pleas Koch, NP  montelukast (SINGULAIR) 10 MG tablet Take 10 mg by mouth at bedtime.    Yes [provider]  ondansetron (ZOFRAN-ODT) 8 MG disintegrating tablet Take 1 tablet (8 mg total) by mouth every 8 (eight) hours as needed for nausea. 04/05/20  Yes Elby Beck, FNP  rosuvastatin (CRESTOR) 10 MG tablet Take 1 tablet (10 mg total) by mouth every evening. For cholesterol. 09/06/19  Yes Pleas Koch, NP  TRULICITY 1.5 WF/0.9NA SOPN ADMINISTER 1.5 MG UNDER THE SKIN 1 TIME A WEEK 01/31/20  Yes Pleas Koch, NP  clarithromycin (BIAXIN) 500 MG tablet Take 1 tablet (500 mg total) by mouth 2 (two) times daily for 10 days. 06/28/20 07/08/20  Margarette Canada, NP  promethazine-dextromethorphan (PROMETHAZINE-DM) 6.25-15 MG/5ML syrup Take 5 mLs by mouth 4 (four) times daily as needed. 06/28/20   Margarette Canada, NP    Family History Family History  Problem Relation Age of Onset  . Asthma Mother   . Diabetes Mother   . Hyperlipidemia Mother   .  Hypertension Mother   . Diabetes Father   . Hyperlipidemia Father   . Hypertension Father   . Diabetes Brother   . Depression Brother   . Alcohol abuse Brother   . Depression Maternal Grandmother   . Stroke Paternal Grandmother   . Breast cancer Neg Hx     Social History Social History   Tobacco Use  . Smoking status: Never Smoker  . Smokeless tobacco: Never Used  Vaping Use  . Vaping Use: Never used  Substance Use Topics  . Alcohol use: No  . Drug use: No     Allergies   Ibuprofen, Ciprofloxacin, Metformin and related, Penicillins, Sulfa antibiotics, Other, and Shellfish allergy   Review of Systems Review of Systems  Constitutional: Positive for fever. Negative for activity change and appetite change.  HENT: Positive for congestion, ear pain, postnasal drip, rhinorrhea, sinus pressure, sinus pain and sore throat. Negative for ear discharge.   Respiratory: Positive for cough,  shortness of breath and wheezing.   Cardiovascular: Negative for chest pain.  Gastrointestinal: Negative for abdominal pain, diarrhea, nausea and vomiting.  Musculoskeletal: Negative for arthralgias and myalgias.  Skin: Negative for rash.  Neurological: Positive for headaches. Negative for syncope.  Hematological: Negative.   Psychiatric/Behavioral: Negative.      Physical Exam Triage Vital Signs ED Triage Vitals  Enc Vitals Group     BP      Pulse      Resp      Temp      Temp src      SpO2      Weight      Height      Head Circumference      Peak Flow      Pain Score      Pain Loc      Pain Edu?      Excl. in [place]?    No data found.  Updated Vital Signs BP (!) 139/103 (BP Location: Left Arm)   Pulse 91   Temp 98.4 F (36.9 C) (Oral)   Resp 18   Ht _0  (1.676 m)   Wt 294 lb 15.6 oz (133.8 kg)   LMP 07/24/2018 (Exact Date) Comment: surgery 07/31/2018  SpO2 97%   BMI 47.61 kg/m   Visual Acuity Right Eye Distance:   Left Eye Distance:   Bilateral Distance:     Right Eye Near:   Left Eye Near:    Bilateral Near:     Physical Exam Vitals and nursing note reviewed.  Constitutional:      General: She is not in acute distress.    Appearance: Normal appearance. She is normal weight. She is not toxic-appearing.  HENT:     Head: Normocephalic and atraumatic.     Right Ear: Ear canal and external ear normal.     Left Ear: Ear canal and external ear normal.     Ears:     Comments: Tympanic membrane's are erythematous and injected bilaterally with milky effusions.    Nose: Congestion and rhinorrhea present.     Comments: Patient has mildly erythematous and edematous nasal mucosa with clear nasal discharge.    Mouth/Throat:     Mouth: Mucous membranes are moist.     Pharynx: Oropharynx is clear. No oropharyngeal exudate or posterior oropharyngeal erythema.  Eyes:     General: No scleral icterus.    Extraocular Movements: Extraocular movements intact.     Conjunctiva/sclera: Conjunctivae normal.     Pupils: Pupils are equal, round, and reactive to light.  Neck:     Comments: She has shotty anterior cervical lymphadenopathy Cardiovascular:     Rate and Rhythm: Normal rate and regular rhythm.     Pulses: Normal pulses.     Heart sounds: Normal heart sounds. No murmur heard.  No gallop.   Pulmonary:     Effort: Pulmonary effort is normal.     Breath sounds: Normal breath sounds. No wheezing, rhonchi or rales.  Musculoskeletal:        General: No swelling or tenderness. Normal range of motion.     Cervical back: Normal range of motion and neck supple.  Lymphadenopathy:     Cervical: Cervical adenopathy present.  Skin:    General: Skin is warm and dry.     Capillary Refill: Capillary refill takes less than 2 seconds.     Findings: No erythema or rash.  Neurological:     General: No focal deficit present.  Mental Status: She is alert and oriented to person, place, and time.  Psychiatric:        Mood and Affect: Mood normal.         Behavior: Behavior normal.        Thought Content: Thought content normal.        Judgment: Judgment normal.      UC Treatments / Results  Labs (all labs ordered are listed, but only abnormal results are displayed) Labs Reviewed  RESP PANEL BY RT-PCR (FLU A&B, COVID) [id number]    EKG   Radiology No results found.  Procedures Procedures (including critical care time)  Medications Ordered in UC Medications - No data to display  Initial Impression / Assessment and Plan / UC Course  I have reviewed the triage vital signs and the nursing notes.  Pertinent labs & imaging results that were available during my care of the patient were reviewed by me and considered in my medical decision making (see chart for details).   Patient is here for evaluation of multiple cold complaints.  Physical exam reveals bilateral otitis media.  Patient has maxillary sinus tenderness to percussion but no pus in her nasal passages.  There is mild erythema and edema in her nasal passages.  Patient is some clear postnasal drip.  Lungs are clear to auscultation.  Patient has had her Covid vaccine but not her flu shot.  Patient is employed in a long-term care facility.  Will check [facility] respiratory panel.  Strep respiratory panel is negative for flu and Covid.  We will treat patient for bilateral otitis media with clarithromycin twice daily x10 days as patient has an allergy to penicillins and has never taken cephalosporins.  Final Clinical Impressions(s) / UC Diagnoses   Final diagnoses:  Upper respiratory tract infection, unspecified type  Non-recurrent acute suppurative otitis media of both ears without spontaneous rupture of tympanic membranes     Discharge Instructions     Take the clarithromycin twice daily with food for 10 days.  Take an over-the-counter probiotic 1 hour after each dose of antibiotic to prevent diarrhea.  Use the Promethazine DM as needed for cough and congestion.  Use  over-the-counter Tylenol and ibuprofen as needed for pain and fever.  Follow-up with your primary care provider for new or worsening symptoms.    ED Prescriptions    Medication Sig Dispense Auth. Provider   clarithromycin (BIAXIN) 500 MG tablet Take 1 tablet (500 mg total) by mouth 2 (two) times daily for 10 days. 20 tablet Margarette Canada, NP   promethazine-dextromethorphan (PROMETHAZINE-DM) 6.25-15 MG/5ML syrup Take 5 mLs by mouth 4 (four) times daily as needed. 118 mL Margarette Canada, NP     PDMP not reviewed this encounter.   Margarette Canada, NP 06/28/20 2004

## 2020-06-28 NOTE — Telephone Encounter (Signed)
[facility] Day - Client TELEPHONE ADVICE RECORD [facility] Patient Name: Joan [name] Gender: Female DOB: [birth year] Age: 39 Y 9 M 25 D Return Phone Number: [phone] (Primary) Address: City/State/Zip: [place] [address] Client [facility] Day - Client Client Site [facility] - Day Physician Vernona Rieger - NP Contact Type Call Who Is Calling Patient / Member / Family / Caregiver Call Type Triage / Clinical Relationship To Patient Self Return Phone Number [phone] (Primary) Chief Complaint ASTHMA ATTACK Reason for Call Symptomatic / Request for Health Information Initial Comment Caller states that she is very sick and she feels like she has the flu. She is having severe asthma flare ups, chest congestion and is hoarse when talking. The office providers are all booked until Friday. Translation No Nurse Assessment Nurse: Odis Luster, RN, Bjorn Loser Date/Time Lamount Cohen Time): 06/28/2020 11:10:07 AM Confirm and document reason for call. If symptomatic, describe symptoms. ---Caller states that she is very sick and she feels like she has the flu. She is having severe asthma flare ups, chest congestion and is hoarse when talking. The office providers are all booked until Friday. s/s began yesterday. No fever. Wheezing today; has inhaler. Does the patient have any new or worsening symptoms? ---Yes Will a triage be completed? ---Yes Related visit to physician within the last 2 weeks? ---No Does the PT have any chronic conditions? (i.e. diabetes, asthma, this includes High risk factors for pregnancy, etc.) ---Yes List chronic conditions. ---asthma; diabetes Is the patient pregnant or possibly pregnant? (Ask all females between the ages of 69-55) ---No Is this a behavioral health or substance abuse call? ---No Guidelines Guideline Title Affirmed Question Affirmed Notes Nurse Date/Time (Eastern Time) COVID-19 -  Diagnosed or Suspected [1] HIGH RISK patient AND [2] influenza is widespread in the community AND [3] ONE OR MORE respiratory symptoms: Kerrin Champagne 06/28/2020 11:11:23 AM PLEASE NOTE: All timestamps contained within this report are represented as [place] Standard Time. CONFIDENTIALTY NOTICE: This fax transmission is intended only for the addressee. It contains information that is legally privileged, confidential or otherwise protected from use or disclosure. If you are not the intended recipient, you are strictly prohibited from reviewing, disclosing, copying using or disseminating any of this information or taking any action in reliance on or regarding this information. If you have received this fax in error, please notify us immediately by telephone so that we can arrange for its return to [place]. Phone: [phone], Toll-Free: [phone], Fax: [phone] Page: 2 of 2 Call Id: [id number] Guidelines Guideline Title Affirmed Question Affirmed Notes Nurse Date/Time Lamount Cohen Time) cough, sore throat, runny or stuffy nose Disp. Time Lamount Cohen Time) Disposition Final User 06/28/2020 11:07:22 AM Send to Urgent Queue Cherylynn Ridges 06/28/2020 11:17:33 AM Call PCP Now Yes Odis Luster, RN, Juliene Pina Disagree/Comply Comply Caller Understands Yes PreDisposition Home Care Care Advice Given Per Guideline CALL PCP NOW: * You need to discuss this with your doctor (or NP/PA). * I'll page the on-call provider now. If you haven't heard from the provider (or me) within 30 minutes, call again. CALL BACK IF: * You become worse CARE ADVICE given per COVID-19 - DIAGNOSED OR SUSPECTED (Adult) guideline. Comments User: Marlyce Huge, RN Date/Time Lamount Cohen Time): 06/28/2020 11:19:09 AM Nurse called MD office and was told that nurse will call patient back with f/u instructions. Referrals REFERRED TO PCP OFFICE

## 2020-06-28 NOTE — Telephone Encounter (Signed)
Noted  

## 2020-07-18 IMAGING — US US PELVIS COMPLETE TRANSABD/TRANSVAG
1 series · 14 of 25 positions shown · non-contrast
Comparison: CT 02/12/2018

CLINICAL DATA: Right adnexal tenderness



[Series 1: us pelvis complete transabd/transvag · 14 of 56 slices shown]
[im 1/56]
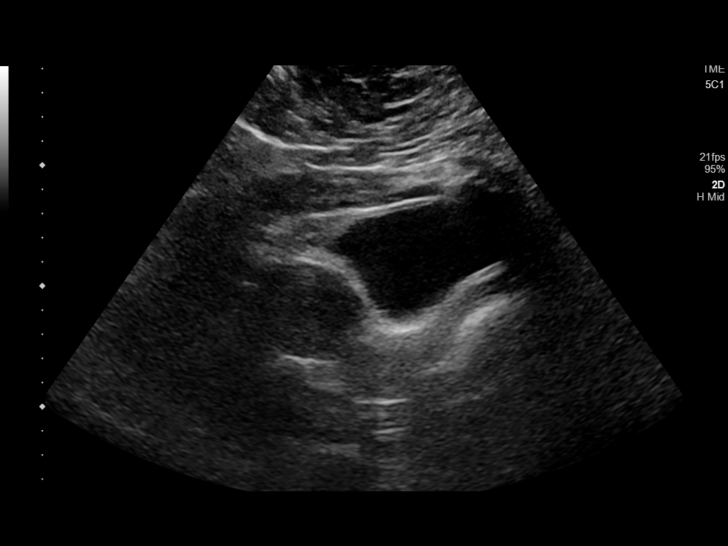
[im 5/56]
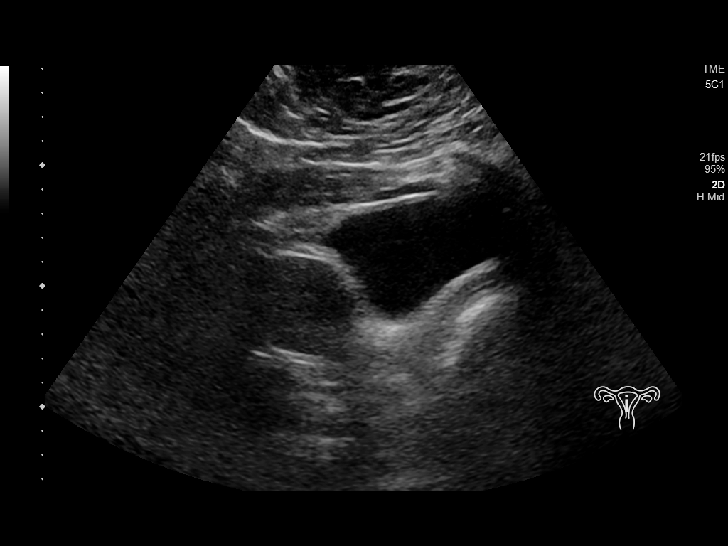
[im 10/56]
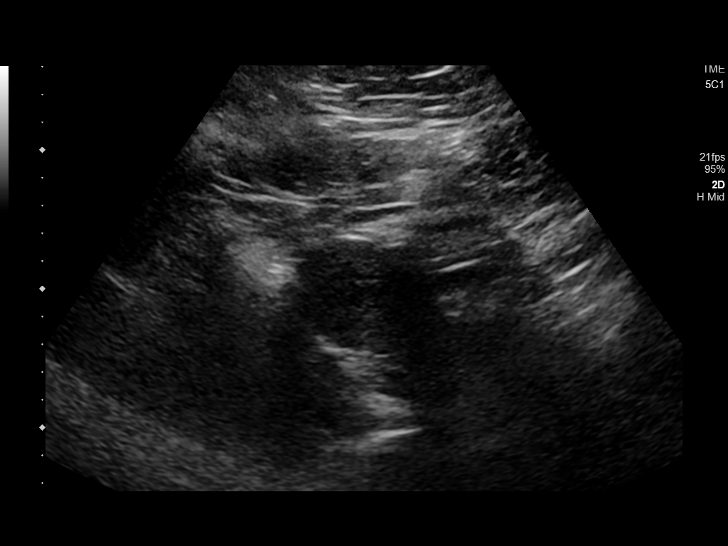
[im 14/56]
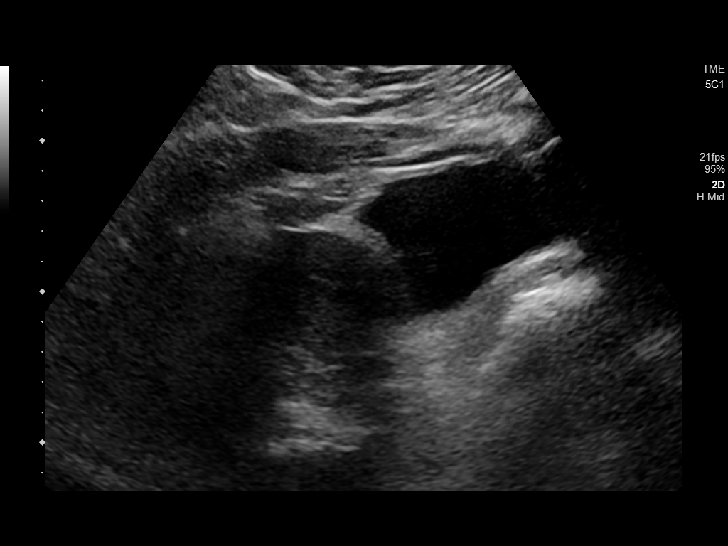
[im 19/56]
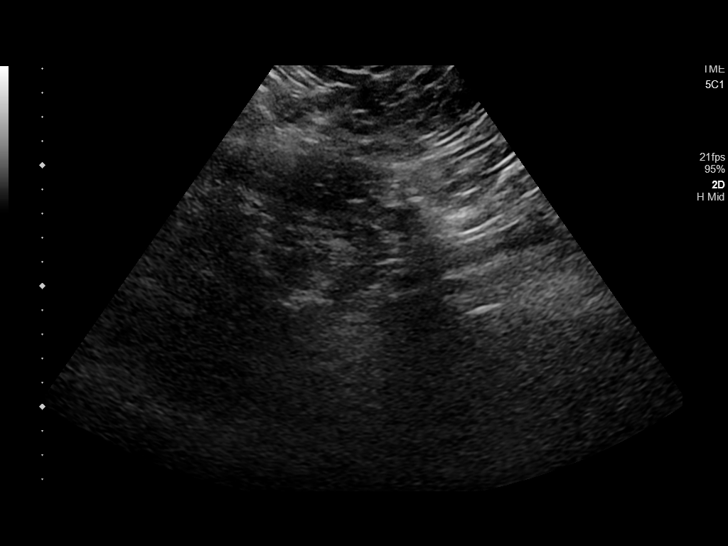
[im 21/56]
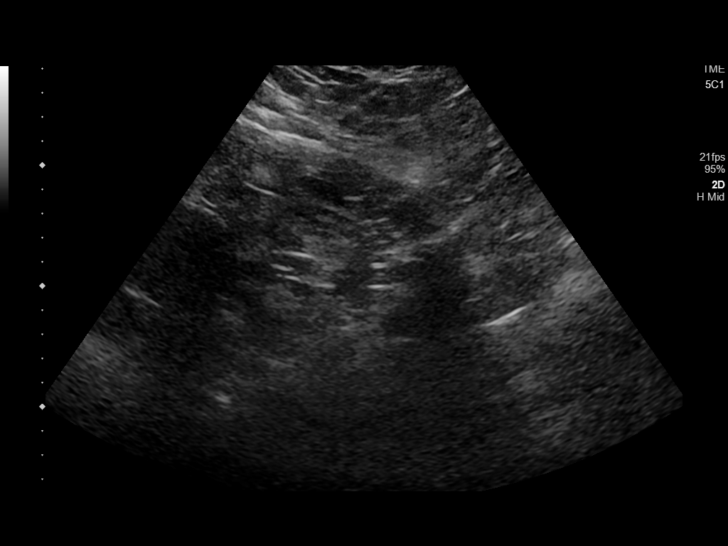
[im 26/56]
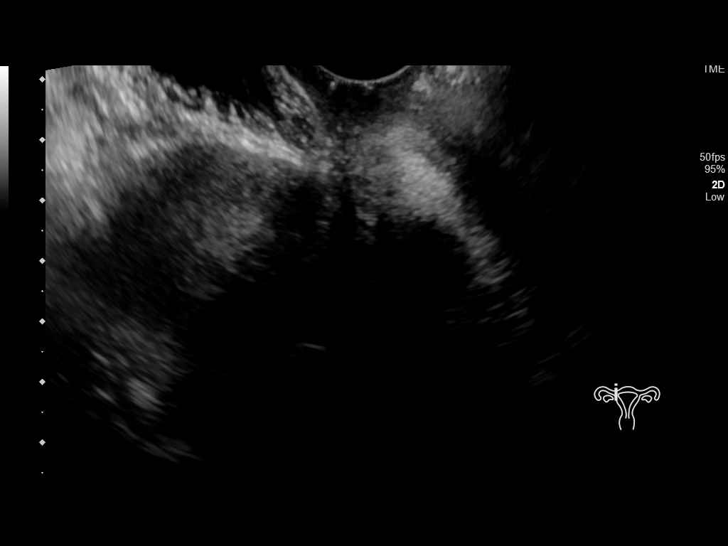
[im 30/56]
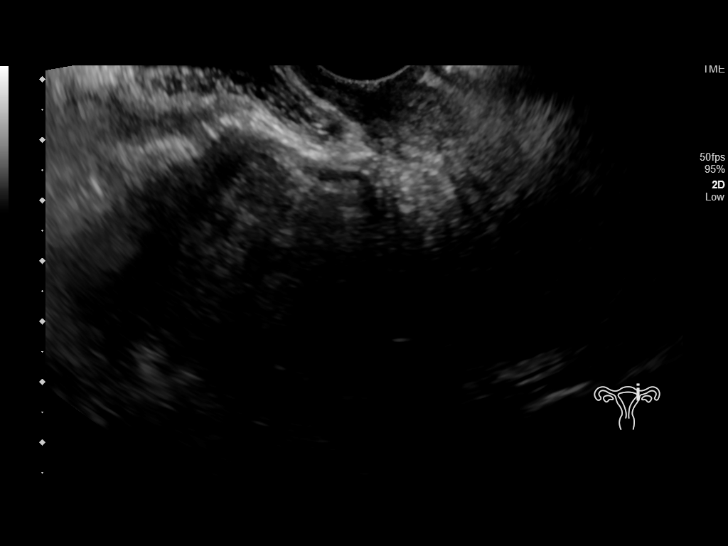
[im 35/56]
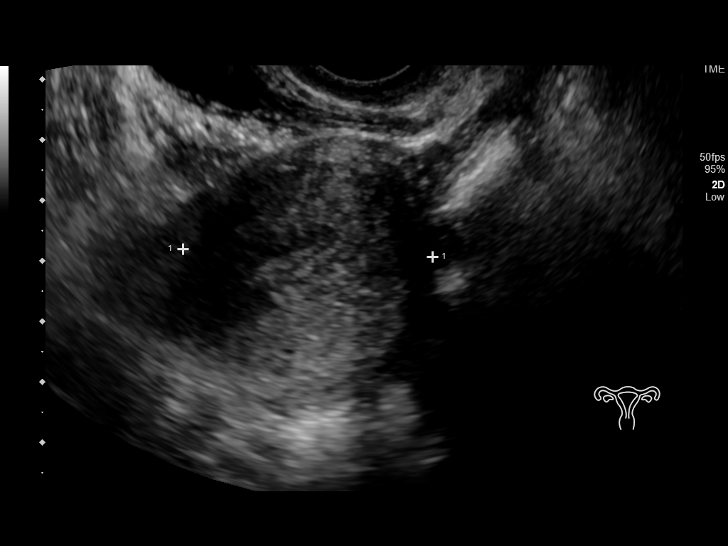
[im 37/56]
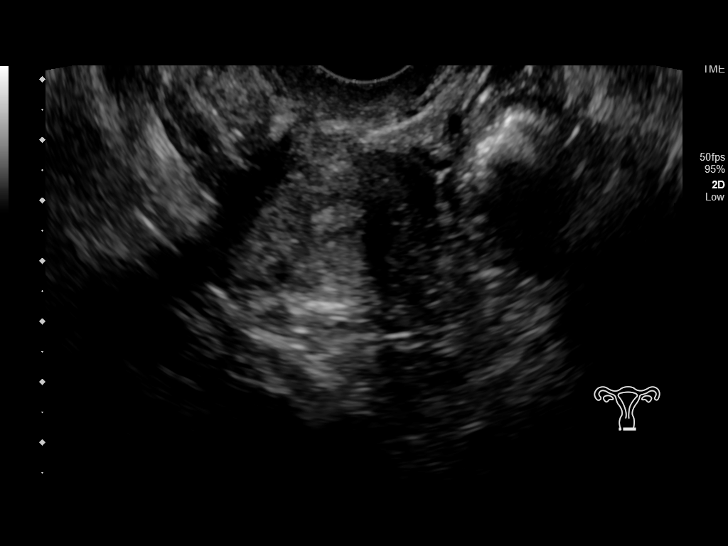
[im 42/56]
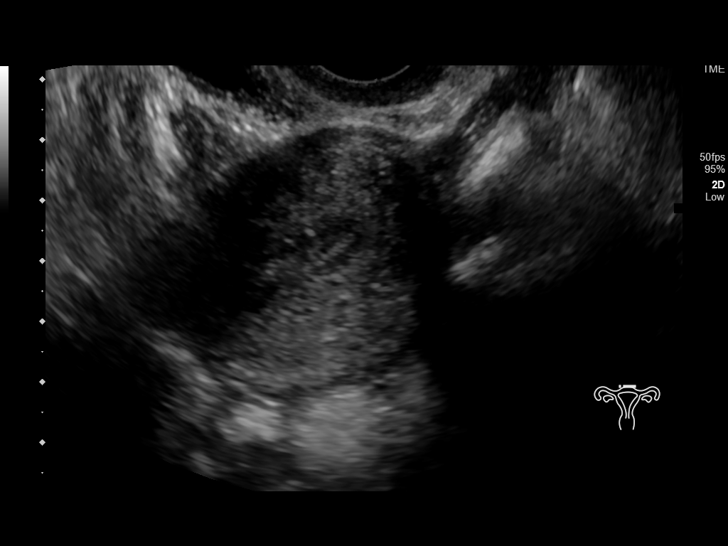
[im 46/56]
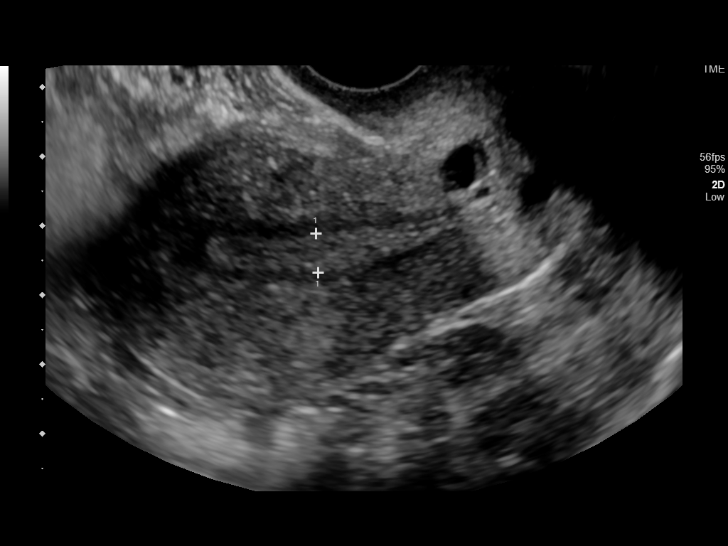
[im 51/56]
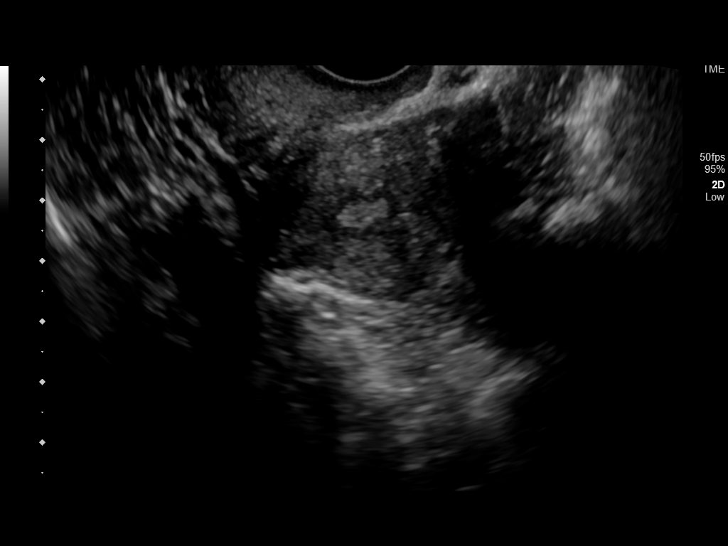
[im 56/56]
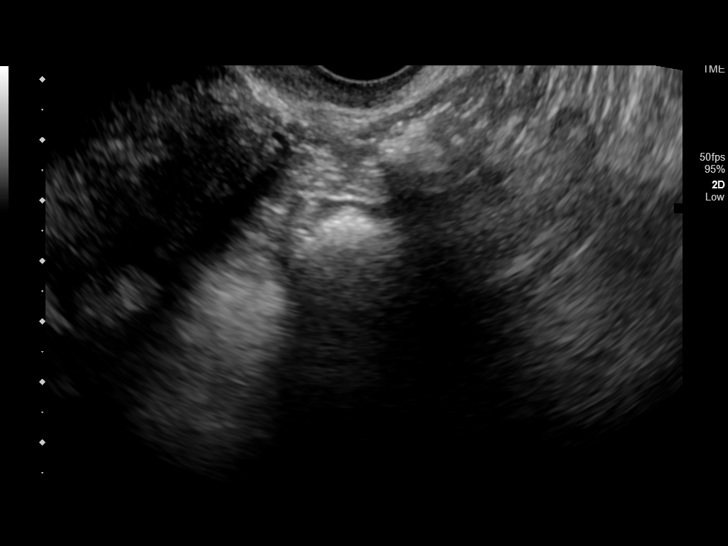

[14 of 25 positions shown; findings below may reference images not displayed]

FINDINGS: Uterus

Measurements: 7.7 x 4.1 x 4.1 cm = volume: 69 mL. No fibroids or
other mass visualized.

Endometrium

Thickness: 6 mm in thickness.  No focal abnormality visualized.

Right ovary

Measurements: Not visualized due to overlying bowel. No adnexal mass
seen.

Left ovary

Measurements: Not visualized due to overlying bowel. No adnexal mass
seen.

Other findings

No abnormal free fluid.
IMPRESSION: No acute findings. Unable to visualize either ovary due to overlying
bowel gas.

## 2020-07-24 ENCOUNTER — Encounter: Payer: Self-pay | Admitting: Emergency Medicine

## 2020-07-24 ENCOUNTER — Other Ambulatory Visit: Payer: Self-pay

## 2020-07-24 ENCOUNTER — Ambulatory Visit (INDEPENDENT_AMBULATORY_CARE_PROVIDER_SITE_OTHER): Payer: Self-pay

## 2020-07-24 ENCOUNTER — Ambulatory Visit
Admission: EM | Admit: 2020-07-24 | Discharge: 2020-07-24 | Disposition: A | Discharge status: Home / Self Care | Payer: Self-pay | Attending: Physician Assistant | Admitting: Physician Assistant

## 2020-07-24 DIAGNOSIS — Z20822 Contact with and (suspected) exposure to covid-19: Secondary | ICD-10-CM

## 2020-07-24 DIAGNOSIS — R059 Cough, unspecified: Secondary | ICD-10-CM

## 2020-07-24 DIAGNOSIS — Z8616 Personal history of COVID-19: Secondary | ICD-10-CM | POA: Insufficient documentation

## 2020-07-24 DIAGNOSIS — R0602 Shortness of breath: Secondary | ICD-10-CM

## 2020-07-24 DIAGNOSIS — B349 Viral infection, unspecified: Secondary | ICD-10-CM

## 2020-07-24 DIAGNOSIS — J45901 Unspecified asthma with (acute) exacerbation: Secondary | ICD-10-CM

## 2020-07-24 DIAGNOSIS — Z79899 Other long term (current) drug therapy: Secondary | ICD-10-CM | POA: Insufficient documentation

## 2020-07-24 DIAGNOSIS — R0981 Nasal congestion: Secondary | ICD-10-CM

## 2020-07-24 LAB — RESP PANEL BY RT-PCR (FLU A&B, COVID) ARPGX2
Influenza A by PCR: NEGATIVE
Influenza B by PCR: NEGATIVE
SARS Coronavirus 2 by RT PCR: NEGATIVE

## 2020-07-24 NOTE — ED Provider Notes (Signed)
[facility]    CSN: [id number] Arrival date & time: 07/24/20  0802      History   Chief Complaint No chief complaint on file.   HPI Joan [name] is a 39 y.o. female presenting for onset of cough, headaches, sore throat, nasal congestion, and feeling short of breath this morning.  Patient states that she was at work and advised to leave work to be seen for her symptoms.  Patient works at a nursing home.  Patient states that 3-4 coworkers of been sick with similar symptoms and went to test positive for COVID-19 last week. She has been fully vaccinated for COVID-19.  Patient denies any fever.  Does admit to some fatigue.  Denies any body aches, chest pain, wheezing, abdominal pain, nausea/vomiting/diarrhea, or changes in smell or taste.  Past medical history is significant for asthma, insulin-dependent diabetes, hypertension, GERD, COVID-19 infection in January 2021, depression and PTSD.  Patient has not taken anything over-the-counter for symptoms.  She has no other complaints or concerns.  HPI  Past Medical History:  Diagnosis Date  . Acute asthma exacerbation 04/10/2018  . Acute non-recurrent maxillary sinusitis 09/25/2018  . Asthma   . Auditory hallucinations    only after anesthesia  . BV (bacterial vaginosis) 02/13/2018  . Diabetes mellitus without complication (HCC)    diet controlled  . Diabetes mellitus, type II (HCC)    insulin, jardiance  . Dyspnea    with exertion  . Essential hypertension   . Essential hypertension 11/24/2017  . GERD (gastroesophageal reflux disease)    occasionally-NO MEDS  . History of placement of ear tubes 05/20/2018  . Hyperlipidemia   . MDD (major depressive disorder)   . Miscarriage   . Nausea & vomiting 10/05/2019  . PTSD (post-traumatic stress disorder)   . Right lower quadrant abdominal pain 02/13/2018  . Tachycardia     Patient Active Problem List   Diagnosis Date Noted  . COVID-19 virus infection 08/16/2019  . Kidney  stone 02/11/2019  . Asthma 11/03/2018  . Recurrent sinusitis 05/20/2018  . Recurrent fever of unknown etiology 05/08/2018  . Recurrent URI (upper respiratory infection) 05/08/2018  . Tachycardia 05/04/2018  . Missed period 04/29/2018  . Diabetes mellitus type 2, uncontrolled ([place]) 11/24/2017  . Essential hypertension 11/24/2017  . Hyperlipidemia 11/24/2017  . Major depressive disorder, recurrent, severe without psychotic features ([place])   . PTSD (post-traumatic stress disorder) 04/11/2015    Past Surgical History:  Procedure Laterality Date  . ABDOMINAL HYSTERECTOMY    . ADENOIDECTOMY    . CHOLECYSTECTOMY  2009  . CYSTOSCOPY N/A 07/31/2018   Procedure: CYSTOSCOPY;  Surgeon: Benjaman Kindler, MD;  Location: [facility];  Service: Gynecology;  Laterality: N/A;  . LAPAROSCOPIC BILATERAL SALPINGECTOMY Bilateral 07/31/2018   Procedure: LAPAROSCOPIC BILATERAL SALPINGECTOMY;  Surgeon: Benjaman Kindler, MD;  Location: [facility];  Service: Gynecology;  Laterality: Bilateral;  . LAPAROSCOPIC HYSTERECTOMY N/A 07/31/2018   Procedure: HYSTERECTOMY TOTAL LAPAROSCOPIC;  Surgeon: Benjaman Kindler, MD;  Location: [facility];  Service: Gynecology;  Laterality: N/A;  . LAPAROSCOPY N/A 06/07/2019   Procedure: LAPAROSCOPY OPERATIVE, WITH PERITONEAL BIOPSIES;  Surgeon: Benjaman Kindler, MD;  Location: [facility];  Service: Gynecology;  Laterality: N/A;  . LYSIS OF ADHESION N/A 07/31/2018   Procedure: LYSIS OF ADHESION;  Surgeon: Benjaman Kindler, MD;  Location: [facility];  Service: Gynecology;  Laterality: N/A;  . OVARY SURGERY Right    cyst removed a while ago  . TONSILLECTOMY    . tubes in ear  OB History   No obstetric history on file.      Home Medications    Prior to Admission medications   Medication Sig Start Date End Date Taking? Authorizing Provider  albuterol (VENTOLIN HFA) 108 (90 Base) MCG/ACT inhaler INHALE 2 PUFFS BY MOUTH EVERY 6 HOURS AS NEEDED FOR SHORTNESS OF BREATH AND  WHEEZING Patient taking differently: Inhale 2 puffs into the lungs every 6 (six) hours as needed for wheezing or shortness of breath. INHALE 2 PUFFS BY MOUTH EVERY 6 HOURS AS NEEDED FOR SHORTNESS OF BREATH AND WHEEZING 10/19/18  Yes Pleas Koch, NP  citalopram (CELEXA) 20 MG tablet Take 1 tablet (20 mg total) by mouth daily. For anxiety. 11/09/19  Yes Pleas Koch, NP  insulin glargine (LANTUS SOLOSTAR) 100 UNIT/ML Solostar Pen Inject 50 Units into the skin daily. 02/11/20  Yes Pleas Koch, NP  insulin lispro (HUMALOG KWIKPEN) 100 UNIT/ML KwikPen Inject 0.12 mLs (12 Units total) into the skin 3 (three) times daily. 02/08/20  Yes Pleas Koch, NP  losartan (COZAAR) 25 MG tablet Take 1 tablet (25 mg total) by mouth daily. For blood pressure. 02/08/20  Yes Pleas Koch, NP  montelukast (SINGULAIR) 10 MG tablet Take 10 mg by mouth at bedtime.    Yes [provider]  rosuvastatin (CRESTOR) 10 MG tablet Take 1 tablet (10 mg total) by mouth every evening. For cholesterol. 09/06/19  Yes Pleas Koch, NP  TRULICITY 1.5 VO/5.3GU SOPN ADMINISTER 1.5 MG UNDER THE SKIN 1 TIME A WEEK 01/31/20  Yes Pleas Koch, NP  benzonatate (TESSALON) 100 MG capsule Take 1 capsule (100 mg total) by mouth every 8 (eight) hours. 03/03/20   Lamptey, Myrene Galas, MD  blood glucose meter kit and supplies KIT Dispense based on patient and insurance preference. Use up three times daily as directed. (FOR ICD-9 250.00, 250.01). 11/24/17   Pleas Koch, NP  Insulin Pen Needle (PEN NEEDLES) 31G X 6 MM MISC Use with insulin as directed. 02/02/20   Pleas Koch, NP  ondansetron (ZOFRAN-ODT) 8 MG disintegrating tablet Take 1 tablet (8 mg total) by mouth every 8 (eight) hours as needed for nausea. 04/05/20   Elby Beck, FNP  promethazine-dextromethorphan (PROMETHAZINE-DM) 6.25-15 MG/5ML syrup Take 5 mLs by mouth 4 (four) times daily as needed. Patient taking differently: Take 5 mLs by mouth 4  (four) times daily as needed. 06/28/20   Margarette Canada, NP    Family History Family History  Problem Relation Age of Onset  . Asthma Mother   . Diabetes Mother   . Hyperlipidemia Mother   . Hypertension Mother   . Diabetes Father   . Hyperlipidemia Father   . Hypertension Father   . Diabetes Brother   . Depression Brother   . Alcohol abuse Brother   . Depression Maternal Grandmother   . Stroke Paternal Grandmother   . Breast cancer Neg Hx     Social History Social History   Tobacco Use  . Smoking status: Never Smoker  . Smokeless tobacco: Never Used  Vaping Use  . Vaping Use: Never used  Substance Use Topics  . Alcohol use: No  . Drug use: No     Allergies   Ibuprofen, Ciprofloxacin, Metformin and related, Penicillins, Sulfa antibiotics, Other, and Shellfish allergy   Review of Systems Review of Systems  Constitutional: Positive for fatigue. Negative for chills, diaphoresis and fever.  HENT: Positive for congestion, rhinorrhea and sore throat. Negative for ear pain,  sinus pressure and sinus pain.   Respiratory: Positive for cough and shortness of breath. Negative for chest tightness and wheezing.   Cardiovascular: Negative for chest pain.  Gastrointestinal: Negative for abdominal pain, nausea and vomiting.  Musculoskeletal: Negative for arthralgias and myalgias.  Skin: Negative for rash.  Neurological: Positive for headaches. Negative for dizziness and weakness.  Hematological: Negative for adenopathy.     Physical Exam Triage Vital Signs ED Triage Vitals  Enc Vitals Group     BP 07/24/20 0828 (!) 148/96     Pulse Rate 07/24/20 0828 82     Resp 07/24/20 0828 18     Temp 07/24/20 0828 97.9 F (36.6 C)     Temp Source 07/24/20 0828 Oral     SpO2 07/24/20 0828 100 %     Weight 07/24/20 0826 300 lb (136.1 kg)     Height 07/24/20 0826 '5\' 6"'  (1.676 m)     Head Circumference --      Peak Flow --      Pain Score 07/24/20 0825 8     Pain Loc --      Pain  Edu? --      Excl. in [place]? --    No data found.  Updated Vital Signs BP (!) 148/96 (BP Location: Right Arm)   Pulse 82   Temp 97.9 F (36.6 C) (Oral)   Resp 18   Ht '5\' 6"'  (1.676 m)   Wt 300 lb (136.1 kg)   LMP 07/24/2018 (Exact Date) Comment: surgery 07/31/2018  SpO2 100%   BMI 48.42 kg/m    Physical Exam Vitals and nursing note reviewed.  Constitutional:      General: She is not in acute distress.    Appearance: Normal appearance. She is obese. She is ill-appearing. She is not toxic-appearing.  HENT:     Head: Normocephalic and atraumatic.     Right Ear: Tympanic membrane, ear canal and external ear normal.     Left Ear: Tympanic membrane, ear canal and external ear normal.     Nose: Congestion and rhinorrhea (trace clear drainage) present.     Mouth/Throat:     Mouth: Mucous membranes are moist.     Pharynx: Oropharynx is clear. Posterior oropharyngeal erythema (mild) present.  Eyes:     General: No scleral icterus.       Right eye: No discharge.        Left eye: No discharge.     Conjunctiva/sclera: Conjunctivae normal.  Cardiovascular:     Rate and Rhythm: Normal rate and regular rhythm.     Heart sounds: Normal heart sounds.  Pulmonary:     Effort: Pulmonary effort is normal. No respiratory distress.     Breath sounds: Normal breath sounds. No wheezing, rhonchi or rales.  Musculoskeletal:     Cervical back: Neck supple.  Skin:    General: Skin is dry.  Neurological:     General: No focal deficit present.     Mental Status: She is alert. Mental status is at baseline.     Motor: No weakness.     Gait: Gait normal.  Psychiatric:        Mood and Affect: Mood normal.        Behavior: Behavior normal.        Thought Content: Thought content normal.      UC Treatments / Results  Labs (all labs ordered are listed, but only abnormal results are displayed) Labs Reviewed  RESP PANEL BY RT-PCR (  FLU A&B, COVID) [id number]    EKG   Radiology DG Chest 2  View  Result Date: 07/24/2020 CLINICAL DATA:  Cough and shortness of breath EXAM: CHEST - 2 VIEW COMPARISON:  February 04, 2020 FINDINGS: The lungs are clear. The heart size and pulmonary vascularity are normal. No adenopathy. No bone lesions. IMPRESSION: Lungs clear.  Cardiac silhouette normal. Electronically Signed   By: Lowella Grip III M.D.   On: 07/24/2020 08:48    Procedures Procedures (including critical care time)  Medications Ordered in UC Medications - No data to display  Initial Impression / Assessment and Plan / UC Course  I have reviewed the triage vital signs and the nursing notes.  Pertinent labs & imaging results that were available during my care of the patient were reviewed by me and considered in my medical decision making (see chart for details).   39 year old female presenting for onset of cough, congestion, sore throat, fatigue, shortness of breath this morning.  Does admit to positive COVID-19 exposure.  All vital signs are stable in clinic however blood pressure is elevated 148/96.  She is afebrile.  She is in no acute distress, but is ill-appearing.  On exam, she has mild congestion rhinorrhea as well as posterior pharyngeal erythema.  Her chest is clear to auscultation.  Heart regular rate and rhythm.  Chest x-ray obtained due to complaint of shortness of breath.  Chest x-ray independently reviewed by me and within normal limits.  Respiratory panel obtained.  Advised patient we will call with results.  Advised increasing rest and fluids.  Advised over-the-counter DayQuil/NyQuil for cough since that is what she has at home and says that it does help.  CDC count, isolation protocol and ED precautions reviewed if COVID positive.  Respiratory panel all negative.  Discussed results with patient.  Advised supportive care and ED precautions again reviewed.  Final Clinical Impressions(s) / UC Diagnoses   Final diagnoses:  Viral illness  Cough  Nasal congestion   Shortness of breath  Asthma with acute exacerbation, unspecified asthma severity, unspecified whether persistent  Exposure to COVID-19 virus     Discharge Instructions     URI/COLD SYMPTOMS: Your exam today is consistent with a viral illness. Antibiotics are not indicated at this time. Use medications as directed, including cough syrup, nasal saline, and decongestants. Your symptoms should improve over the next few days and resolve within 7-10 days. Increase rest and fluids. F/u if symptoms worsen or predominate such as sore throat, ear pain, productive cough, shortness of breath, or if you develop high fevers or worsening fatigue over the next several days.    You have received COVID testing today either for positive exposure, concerning symptoms that could be related to COVID infection, screening purposes, or re-testing after confirmed positive.  Your test obtained today checks for active viral infection in the last 1-2 weeks. If your test is negative now, you can still test positive later. So, if you do develop symptoms you should either get re-tested and/or isolate x 10 days. Please follow CDC guidelines.  While Rapid antigen tests come back in 15-20 minutes, send out PCR/molecular test results typically come back within 24 hours. In the mean time, if you are symptomatic, assume this could be a positive test and treat/monitor yourself as if you do have COVID.   We will call with test results. Please download the MyChart app and set up a profile to access test results.   If symptomatic, go home and rest.  Push fluids. Take Tylenol as needed for discomfort. Gargle warm salt water. Throat lozenges. Take Mucinex DM or Robitussin for cough. Humidifier in bedroom to ease coughing. Warm showers. Also review the COVID handout for more information.  COVID-19 INFECTION: The incubation period of COVID-19 is approximately 14 days after exposure, with most symptoms developing in roughly 4-5 days. Symptoms  may range in severity from mild to critically severe. Roughly 80% of those infected will have mild symptoms. People of any age may become infected with COVID-19 and have the ability to transmit the virus. The most common symptoms include: fever, fatigue, cough, body aches, headaches, sore throat, nasal congestion, shortness of breath, nausea, vomiting, diarrhea, changes in smell and/or taste.    COURSE OF ILLNESS Some patients may begin with mild disease which can progress quickly into critical symptoms. If your symptoms are worsening please call ahead to the Emergency Department and proceed there for further treatment. Recovery time appears to be roughly 1-2 weeks for mild symptoms and 3-6 weeks for severe disease.   GO IMMEDIATELY TO ER FOR FEVER YOU ARE UNABLE TO GET DOWN WITH TYLENOL, BREATHING PROBLEMS, CHEST PAIN, FATIGUE, LETHARGY, INABILITY TO EAT OR DRINK, ETC  QUARANTINE AND ISOLATION: To help decrease the spread of COVID-19 please remain isolated if you have COVID infection or are highly suspected to have COVID infection. This means -stay home and isolate to one room in the home if you live with others. Do not share a bed or bathroom with others while ill, sanitize and wipe down all countertops and keep common areas clean and disinfected. You may discontinue isolation if you have a mild case and are asymptomatic 10 days after symptom onset as long as you have been fever free >24 hours without having to take Motrin or Tylenol. If your case is more severe (meaning you develop pneumonia or are admitted in the hospital), you may have to isolate longer.   If you have been in close contact (within 6 feet) of someone diagnosed with COVID 19, you are advised to quarantine in your home for 14 days as symptoms can develop anywhere from 2-14 days after exposure to the virus. If you develop symptoms, you  must isolate.  Most current guidelines for COVID after exposure -isolate 10 days if you ARE NOT tested  for COVID as long as symptoms do not develop -isolate 7 days if you are tested and remain asymptomatic -You do not necessarily need to be tested for COVID if you have + exposure and        develop   symptoms. Just isolate at home x10 days from symptom onset During this global pandemic, CDC advises to practice social distancing, try to stay at least 37f away from others at all times. Wear a face covering. Wash and sanitize your hands regularly and avoid going anywhere that is not necessary.  KEEP IN MIND THAT THE COVID TEST IS NOT 100% ACCURATE AND YOU SHOULD STILL DO EVERYTHING TO PREVENT POTENTIAL SPREAD OF VIRUS TO OTHERS (WEAR MASK, WEAR GLOVES, WResacaHANDS AND SANITIZE REGULARLY). IF INITIAL TEST IS NEGATIVE, THIS MAY NOT MEAN YOU ARE DEFINITELY NEGATIVE. MOST ACCURATE TESTING IS DONE 5-7 DAYS AFTER EXPOSURE.   It is not advised by CDC to get re-tested after receiving a positive COVID test since you can still test positive for weeks to months after you have already cleared the virus.   *If you have not been vaccinated for COVID, I strongly suggest you consider getting vaccinated  as long as there are no contraindications.  v    ED Prescriptions    None     PDMP not reviewed this encounter.   Danton Clap, PA-C 07/24/20 [phone]

## 2020-07-24 NOTE — Discharge Instructions (Signed)
URI/COLD SYMPTOMS: Your exam today is consistent with a viral illness. Antibiotics are not indicated at this time. Use medications as directed, including cough syrup, nasal saline, and decongestants. Your symptoms should improve over the next few days and resolve within 7-10 days. Increase rest and fluids. F/u if symptoms worsen or predominate such as sore throat, ear pain, productive cough, shortness of breath, or if you develop high fevers or worsening fatigue over the next several days.    You have received COVID testing today either for positive exposure, concerning symptoms that could be related to COVID infection, screening purposes, or re-testing after confirmed positive.  Your test obtained today checks for active viral infection in the last 1-2 weeks. If your test is negative now, you can still test positive later. So, if you do develop symptoms you should either get re-tested and/or isolate x 10 days. Please follow CDC guidelines.  While Rapid antigen tests come back in 15-20 minutes, send out PCR/molecular test results typically come back within 24 hours. In the mean time, if you are symptomatic, assume this could be a positive test and treat/monitor yourself as if you do have COVID.   We will call with test results. Please download the MyChart app and set up a profile to access test results.   If symptomatic, go home and rest. Push fluids. Take Tylenol as needed for discomfort. Gargle warm salt water. Throat lozenges. Take Mucinex DM or Robitussin for cough. Humidifier in bedroom to ease coughing. Warm showers. Also review the COVID handout for more information.  COVID-19 INFECTION: The incubation period of COVID-19 is approximately 14 days after exposure, with most symptoms developing in roughly 4-5 days. Symptoms may range in severity from mild to critically severe. Roughly 80% of those infected will have mild symptoms. People of any age may become infected with COVID-19 and have the ability  to transmit the virus. The most common symptoms include: fever, fatigue, cough, body aches, headaches, sore throat, nasal congestion, shortness of breath, nausea, vomiting, diarrhea, changes in smell and/or taste.    COURSE OF ILLNESS Some patients may begin with mild disease which can progress quickly into critical symptoms. If your symptoms are worsening please call ahead to the Emergency Department and proceed there for further treatment. Recovery time appears to be roughly 1-2 weeks for mild symptoms and 3-6 weeks for severe disease.   GO IMMEDIATELY TO ER FOR FEVER YOU ARE UNABLE TO GET DOWN WITH TYLENOL, BREATHING PROBLEMS, CHEST PAIN, FATIGUE, LETHARGY, INABILITY TO EAT OR DRINK, ETC  QUARANTINE AND ISOLATION: To help decrease the spread of COVID-19 please remain isolated if you have COVID infection or are highly suspected to have COVID infection. This means -stay home and isolate to one room in the home if you live with others. Do not share a bed or bathroom with others while ill, sanitize and wipe down all countertops and keep common areas clean and disinfected. You may discontinue isolation if you have a mild case and are asymptomatic 10 days after symptom onset as long as you have been fever free >24 hours without having to take Motrin or Tylenol. If your case is more severe (meaning you develop pneumonia or are admitted in the hospital), you may have to isolate longer.   If you have been in close contact (within 6 feet) of someone diagnosed with COVID 19, you are advised to quarantine in your home for 14 days as symptoms can develop anywhere from 2-14 days after exposure to the   virus. If you develop symptoms, you  must isolate.  Most current guidelines for COVID after exposure -isolate 10 days if you ARE NOT tested for COVID as long as symptoms do not develop -isolate 7 days if you are tested and remain asymptomatic -You do not necessarily need to be tested for COVID if you have + exposure  and        develop   symptoms. Just isolate at home x10 days from symptom onset During this global pandemic, CDC advises to practice social distancing, try to stay at least 83ft away from others at all times. Wear a face covering. Wash and sanitize your hands regularly and avoid going anywhere that is not necessary.  KEEP IN MIND THAT THE COVID TEST IS NOT 100% ACCURATE AND YOU SHOULD STILL DO EVERYTHING TO PREVENT POTENTIAL SPREAD OF VIRUS TO OTHERS (WEAR MASK, WEAR GLOVES, WASH HANDS AND SANITIZE REGULARLY). IF INITIAL TEST IS NEGATIVE, THIS MAY NOT MEAN YOU ARE DEFINITELY NEGATIVE. MOST ACCURATE TESTING IS DONE 5-7 DAYS AFTER EXPOSURE.   It is not advised by CDC to get re-tested after receiving a positive COVID test since you can still test positive for weeks to months after you have already cleared the virus.   *If you have not been vaccinated for COVID, I strongly suggest you consider getting vaccinated as long as there are no contraindications.  v

## 2020-07-24 NOTE — ED Triage Notes (Signed)
Patient c/o cough, headache, shortness of breath and nasal congestion that started this morning.

## 2020-07-25 ENCOUNTER — Telehealth: Payer: Self-pay | Admitting: Nurse Practitioner

## 2020-07-25 ENCOUNTER — Telehealth: Payer: Self-pay | Admitting: Primary Care

## 2020-07-25 DIAGNOSIS — J00 Acute nasopharyngitis [common cold]: Secondary | ICD-10-CM

## 2020-07-25 MED ORDER — FLUTICASONE PROPIONATE 50 MCG/ACT NA SUSP
2.0000 | Freq: Every day | NASAL | 6 refills | Status: DC
Start: 2020-07-25 — End: 2020-07-26

## 2020-07-25 NOTE — Telephone Encounter (Signed)
Noted, it appears that she does not have Covid-19. Notes reviewed from Urgent Care.

## 2020-07-25 NOTE — Progress Notes (Signed)
We are sorry you are not feeling well.  Here is how we plan to help!  Based on what you have shared with me, it looks like you may have a viral upper respiratory infection.  Upper respiratory infections are caused by a large number of viruses; however, rhinovirus is the most common cause.   Symptoms vary from person to person, with common symptoms including sore throat, cough, fatigue or lack of energy and feeling of general discomfort.  A low-grade fever of up to 100.4 may present, but is often uncommon.  Symptoms vary however, and are closely related to a person's age or underlying illnesses.  The most common symptoms associated with an upper respiratory infection are nasal discharge or congestion, cough, sneezing, headache and pressure in the ears and face.  These symptoms usually persist for about 3 to 10 days, but can last up to 2 weeks.  It is important to know that upper respiratory infections do not cause serious illness or complications in most cases.    Upper respiratory infections can be transmitted from person to person, with the most common method of transmission being a person's hands.  The virus is able to live on the skin and can infect other persons for up to 2 hours after direct contact.  Also, these can be transmitted when someone coughs or sneezes; thus, it is important to cover the mouth to reduce this risk.  To keep the spread of the illness at bay, good hand hygiene is very important.  This is an infection that is most likely caused by a virus. There are no specific treatments other than to help you with the symptoms until the infection runs its course.  We are sorry you are not feeling well.  Here is how we plan to help!   For nasal congestion, you may use an oral decongestants such as Mucinex D or if you have glaucoma or high blood pressure use plain Mucinex.  Saline nasal spray or nasal drops can help and can safely be used as often as needed for congestion.  For your congestion,  I have prescribed Fluticasone nasal spray one spray in each nostril twice a day  If you do not have a history of heart disease, hypertension, diabetes or thyroid disease, prostate/bladder issues or glaucoma, you may also use Sudafed to treat nasal congestion.  It is highly recommended that you consult with a pharmacist or your primary care physician to ensure this medication is safe for you to take.     If you have a cough, you may use cough suppressants such as Delsym and Robitussin.  If you have glaucoma or high blood pressure, you can also use Coricidin HBP.    If you have a sore or scratchy throat, use a saltwater gargle-  to  teaspoon of salt dissolved in a 4-ounce to 8-ounce glass of warm water.  Gargle the solution for approximately 15-30 seconds and then spit.  It is important not to swallow the solution.  You can also use throat lozenges/cough drops and Chloraseptic spray to help with throat pain or discomfort.  Warm or cold liquids can also be helpful in relieving throat pain.  For headache, pain or general discomfort, you can use Ibuprofen or Tylenol as directed.   Some authorities believe that zinc sprays or the use of Echinacea may shorten the course of your symptoms.   HOME CARE . Only take medications as instructed by your medical team. . Be sure to drink plenty   of fluids. Water is fine as well as fruit juices, sodas and electrolyte beverages. You may want to stay away from caffeine or alcohol. If you are nauseated, try taking small sips of liquids. How do you know if you are getting enough fluid? Your urine should be a pale yellow or almost colorless. . Get rest. . Taking a steamy shower or using a humidifier may help nasal congestion and ease sore throat pain. You can place a towel over your head and breathe in the steam from hot water coming from a faucet. . Using a saline nasal spray works much the same way. . Cough drops, hard candies and sore throat lozenges may ease your  cough. . Avoid close contacts especially the very young and the elderly . Cover your mouth if you cough or sneeze . Always remember to wash your hands.   GET HELP RIGHT AWAY IF: . You develop worsening fever. . If your symptoms do not improve within 10 days . You develop yellow or green discharge from your nose over 3 days. . You have coughing fits . You develop a severe head ache or visual changes. . You develop shortness of breath, difficulty breathing or start having chest pain . Your symptoms persist after you have completed your treatment plan  MAKE SURE YOU   Understand these instructions.  Will watch your condition.  Will get help right away if you are not doing well or get worse.  Your e-visit answers were reviewed by a board certified advanced clinical practitioner to complete your personal care plan. Depending upon the condition, your plan could have included both over the counter or prescription medications. Please review your pharmacy choice. If there is a problem, you may call our nursing hot line at and have the prescription routed to another pharmacy. Your safety is important to us. If you have drug allergies check your prescription carefully.   You can use MyChart to ask questions about today's visit, request a non-urgent call back, or ask for a work or school excuse for 24 hours related to this e-Visit. If it has been greater than 24 hours you will need to follow up with your provider, or enter a new e-Visit to address those concerns. You will get an e-mail in the next two days asking about your experience.  I hope that your e-visit has been valuable and will speed your recovery. Thank you for using e-visits.   5-10 minutes spent reviewing and documenting in chart.    

## 2020-07-25 NOTE — Telephone Encounter (Signed)
I recommend retesting given those symptoms. Can we get her retested on 12/22 or 12/23?

## 2020-07-25 NOTE — Telephone Encounter (Signed)
Access nurse called in stating they triaged patient and she is having covid symptoms. Was seen in UC yesterday and negative test done, however RN believes that is a false negative. Pt has fever and no sense of smell. Please advise is able to squeeze in virtual or what steps you would like done.

## 2020-07-25 NOTE — Telephone Encounter (Signed)
Patient called in wanting advice on what to do. States not she can not longer taste or smell anything. Fever is going to 100.7 but is currently treating with tylenol. Also is having body aches all over. Please advise.

## 2020-07-25 NOTE — Telephone Encounter (Signed)
See message in this phone note below the access nurse note; per [facility] note from 07/24/20 pts symptoms started in the morning on 07/24/20. 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

## 2020-07-25 NOTE — Telephone Encounter (Signed)
[facility] Day - Client TELEPHONE ADVICE RECORD AccessNurse Patient Name: Joan [name] Gender: Female DOB: 25-Aug-1980 Age: 39 Y 10 M 21 D Return Phone Number: [phone] (Primary) Address: City/State/Zip: [place] [address] Client [facility] Day - Client Client Site [facility] [place] - Day Physician Vernona Rieger - NP Contact Type Call Who Is Calling Patient / Member / Family / Caregiver Call Type Triage / Clinical Relationship To Patient Self Return Phone Number [phone] (Primary) Chief Complaint Cough Reason for Call Symptomatic / Request for Health Information Initial Comment Caller states that she cannot taste or smell and she is coughing. Translation No Nurse Assessment Nurse: Yetta Barre, RN, Miranda Date/Time (Eastern Time): 07/25/2020 10:51:45 AM Confirm and document reason for call. If symptomatic, describe symptoms. ---Caller states she has a cough since Friday. Yesterday she started having hoarseness, sore throat, and headache. She was seen in UC and rapid COVID and Flu test were negative. CXR normal. This morning she has a fever, no sense of smell, and decreased sense of taste. Temp 100.7. Albuterol Q4hr, last dose 3 hrs ago. Does the patient have any new or worsening symptoms? ---Yes Will a triage be completed? ---Yes Related visit to physician within the last 2 weeks? ---Yes Does the PT have any chronic conditions? (i.e. diabetes, asthma, this includes High risk factors for pregnancy, etc.) ---Yes List chronic conditions. ---Diabetes, Asthma Is the patient pregnant or possibly pregnant? (Ask all females between the ages of 24-55) ---No Is this a behavioral health or substance abuse call? ---No Guidelines Guideline Title Affirmed Question Affirmed Notes Nurse Date/Time (Eastern Time) COVID-19 - Diagnosed or Suspected HIGH RISK for severe COVID complications (e.g., age > 64  years, obesity with BMI > 25, pregnant, chronic lung disease or other chronic medical condition) (Exception: Already seen Yetta Barre, RN, Miranda 07/25/2020 10:55:30 AM PLEASE NOTE: All timestamps contained within this report are represented as [place] Standard Time. CONFIDENTIALTY NOTICE: This fax transmission is intended only for the addressee. It contains information that is legally privileged, confidential or otherwise protected from use or disclosure. If you are not the intended recipient, you are strictly prohibited from reviewing, disclosing, copying using or disseminating any of this information or taking any action in reliance on or regarding this information. If you have received this fax in error, please notify us immediately by telephone so that we can arrange for its return to [place]. Phone: [phone], Toll-Free: [phone], Fax: [phone] Page: 2 of 2 Call Id: [id number] Guidelines Guideline Title Affirmed Question Affirmed Notes Nurse Date/Time Lamount Cohen Time) by PCP and no new or worsening symptoms.) Disp. Time Lamount Cohen Time) Disposition Final User 07/25/2020 11:02:22 AM Call PCP Now Yes Yetta Barre, RN, Miranda Caller Disagree/Comply Comply Caller Understands Yes PreDisposition Call Doctor Care Advice Given Per Guideline CALL PCP NOW: * You need to discuss this with your doctor (or NP/PA). * I'll page the on-call provider now. If you haven't heard from the provider (or me) within 30 minutes, call again. COUGH MEDICINES: * COUGH DROPS: Over-the-counter cough drops can help a lot, especially for mild coughs. They soothe an irritated throat and remove the tickle sensation in the back of the throat. Cough drops are easy to carry with you. * HOME REMEDY - HARD CANDY: Hard candy works just as well as over-thecounter cough drops. People who have diabetes should use sugar-free candy. FEVER MEDICINES: * They are over-the-counter (OTC) drugs that help treat both fever and pain. You can  buy them  at the drugstore. * For fevers above 101 F (38.3 C) take either acetaminophen or ibuprofen. CALL BACK IF: * You become worse CARE ADVICE given per COVID-19 - DIAGNOSED OR SUSPECTED (Adult) guideline. ALTERNATE DISPOSITION - CALL TELEMEDICINE DOCTOR NOW: * Telemedicine may be your best choice for care during this COVID-19 outbreak. * You should call a telemedicine doctor (or NP/PA) now, if your own doctor is not available. Comments User: Grier Rocher, RN Date/Time Lamount Cohen Time): 07/25/2020 11:06:14 AM Called backline per client directives. No appt available. Spoke with Fulton Mole and she will message PCP for further directives and someone from the office will contact the pt. Information given to the pt. Referrals REFERRED TO PCP OFFICE

## 2020-07-26 ENCOUNTER — Telehealth: Payer: Self-pay | Admitting: Primary Care

## 2020-07-26 ENCOUNTER — Encounter: Payer: Self-pay | Admitting: Internal Medicine

## 2020-07-26 ENCOUNTER — Telehealth (INDEPENDENT_AMBULATORY_CARE_PROVIDER_SITE_OTHER): Payer: Self-pay | Admitting: Internal Medicine

## 2020-07-26 ENCOUNTER — Encounter: Payer: Self-pay | Admitting: Emergency Medicine

## 2020-07-26 ENCOUNTER — Ambulatory Visit
Admission: EM | Admit: 2020-07-26 | Discharge: 2020-07-26 | Disposition: A | Discharge status: Home / Self Care | Payer: Self-pay | Attending: Sports Medicine | Admitting: Sports Medicine

## 2020-07-26 ENCOUNTER — Other Ambulatory Visit: Payer: Self-pay

## 2020-07-26 DIAGNOSIS — J4521 Mild intermittent asthma with (acute) exacerbation: Secondary | ICD-10-CM

## 2020-07-26 DIAGNOSIS — Z20822 Contact with and (suspected) exposure to covid-19: Secondary | ICD-10-CM | POA: Insufficient documentation

## 2020-07-26 DIAGNOSIS — J069 Acute upper respiratory infection, unspecified: Secondary | ICD-10-CM | POA: Insufficient documentation

## 2020-07-26 LAB — RESP PANEL BY RT-PCR (FLU A&B, COVID) ARPGX2
Influenza A by PCR: NEGATIVE
Influenza B by PCR: NEGATIVE
SARS Coronavirus 2 by RT PCR: NEGATIVE

## 2020-07-26 MED ORDER — PREDNISONE 20 MG PO TABS: 40.0000 mg | ORAL_TABLET | Freq: Every day | ORAL | 0 refills | Status: DC

## 2020-07-26 NOTE — ED Triage Notes (Signed)
Patient c/o fever, cough, headache that started Monday. She was seen on Monday and tested negative. She states she has worsened and wants to be tested again.

## 2020-07-26 NOTE — Telephone Encounter (Signed)
[facility] Day - Client TELEPHONE ADVICE RECORD AccessNurse Patient Name: Joan [name] Gender: Female DOB: June 09, 1981 Age: 39 Y 10 M 22 D Return Phone Number: [phone] (Primary) Address: City/State/Zip: [place] [address] Client [facility] Day - Client Client Site [facility] - Day Contact Type Call Who Is Calling Patient / Member / Family / Caregiver Call Type Triage / Clinical Relationship To Patient Self Return Phone Number [phone] (Primary) Chief Complaint WHEEZING Reason for Call Symptomatic / Request for Health Information Initial Comment Caller has lost her taste and smell. She has body aches and wheezing. She has a virtual at 930 pm. Caller states she had negative COVID test on Monday. Additional Comment Caller also has a cough and headache. Translation No Nurse Assessment Nurse: Kizzie Bane, RN, Marylene Land Date/Time Lamount Cohen Time): 07/26/2020 8:17:45 AM Confirm and document reason for call. If symptomatic, describe symptoms. ---Caller states she is having a cough and headache and body aches and wheezing that started Monday. She lost her taste and smell yesterday. She tested negative for Covid on Monday. Temp 100.7 Does the patient have any new or worsening symptoms? ---Yes Will a triage be completed? ---Yes Related visit to physician within the last 2 weeks? ---No Does the PT have any chronic conditions? (i.e. diabetes, asthma, this includes High risk factors for pregnancy, etc.) ---Yes List chronic conditions. ---diabetes, asthma Is the patient pregnant or possibly pregnant? (Ask all females between the ages of 84-55) ---No Is this a behavioral health or substance abuse call? ---No Guidelines Guideline Title Affirmed Question Affirmed Notes Nurse Date/Time (Eastern Time) COVID-19 - Diagnosed or Suspected MILD difficulty breathing (e.g., minimal/no SOB at rest, SOB with walking, pulse  <100) Kizzie Bane, RN, Marylene Land 07/26/2020 8:20:11 AM Disp. Time Lamount Cohen Time) Disposition Final User 07/26/2020 8:16:39 AM Send to Urgent Cyndia Bent PLEASE NOTE: All timestamps contained within this report are represented as [place] Standard Time. CONFIDENTIALTY NOTICE: This fax transmission is intended only for the addressee. It contains information that is legally privileged, confidential or otherwise protected from use or disclosure. If you are not the intended recipient, you are strictly prohibited from reviewing, disclosing, copying using or disseminating any of this information or taking any action in reliance on or regarding this information. If you have received this fax in error, please notify us immediately by telephone so that we can arrange for its return to [place]. Phone: [phone], Toll-Free: [phone], Fax: [phone] Page: 2 of 2 Call Id: [id number] 07/26/2020 8:24:44 AM See HCP within 4 Hours (or PCP triage) Yes Kizzie Bane, RN, Rosalyn Charters Disagree/Comply Comply Caller Understands Yes PreDisposition Did not know what to do Care Advice Given Per Guideline SEE HCP (OR PCP TRIAGE) WITHIN 4 HOURS: * IF OFFICE WILL BE OPEN: You need to be seen within the next 3 or 4 hours. Call your doctor (or NP/PA) now or as soon as the office opens. ALTERNATE DISPOSITION - CALL TELEMEDICINE DOCTOR NOW: * Telemedicine may be your best choice for care during this COVID-19 outbreak. GENERAL CARE ADVICE FOR COVID-19 SYMPTOMS: * The treatment is the same whether you have COVID-19, influenza or some other respiratory virus. * Cough: Use cough drops. * Feeling dehydrated: Drink extra liquids. If the air in your home is dry, use a humidifier. * Fever: For fever over 101 F (38.3 C), take acetaminophen every 4 to 6 hours (Adults 650 mg) OR ibuprofen every 6 to 8 hours (Adults 400 mg). Before taking any medicine, read  all the instructions on the package. Do not take aspirin unless your doctor has  prescribed it for you. * Muscle aches, headache, and other pains: Often this comes and goes with the fever. Take acetaminophen every 4 to 6 hours (Adults 650 mg) OR ibuprofen every 6 to 8 hours (Adults 400 mg). Before taking any medicine, read all the instructions on the package. CALL BACK IF: * You become worse CARE ADVICE given per COVID-19 - DIAGNOSED OR SUSPECTED (Adult) guideline. Referrals REFERRED TO PCP OFFICE

## 2020-07-26 NOTE — Telephone Encounter (Signed)
I created a new note and sent to the patient via MyChart.

## 2020-07-26 NOTE — Telephone Encounter (Signed)
Patients employer called in requesting a letter stating that she may return to work and that she was "Covid positive" for documentation. Explained to him that she would have to call to request the letter stating that and she would have to send it to him. Due to HIPAA I can't request that be sent to him. He said he will get with the patient EM

## 2020-07-26 NOTE — Telephone Encounter (Signed)
Viewed schedule and pt will be seen virtually today. Noted in appt notes to have tested.

## 2020-07-26 NOTE — Discharge Instructions (Addendum)
Once again her Covid test was negative, however given that she is unwell appearing I would get a go ahead and keep her out of work and reiterate the note that was given to her 2 days ago here in the office. Supportive care, over-the-counter meds as needed, plenty of rest, plenty fluids. Follow-up on an as-needed basis.  She should seek out immediate medical attention if respiratory symptoms were to worsen and go directly to an emergency room setting.

## 2020-07-26 NOTE — Telephone Encounter (Signed)
See OV note.  

## 2020-07-26 NOTE — ED Provider Notes (Signed)
[facility]    CSN: [id number] Arrival date & time: 07/26/20  1549      History   Chief Complaint Chief Complaint  Patient presents with  . Fever  . Cough    HPI Joan [name] is a 39 y.o. female.   Joan [name] is a 39 y.o. female presenting for onset of cough, headaches, sore throat, nasal congestion, and feeling short of breath for 3 days.  Seen here on 07/24/20.  Covid test was negative. Patient was taken out of work.  Patient works at a nursing home.  Patient states that 3-4 coworkers of been sick with similar symptoms and went to test positive for COVID-19 last week. She has been fully vaccinated for COVID-19.  Patient denies any fever.  Does admit to some fatigue.  Denies chest pain, wheezing, abdominal pain, nausea/vomiting/diarrhea.  She now reports body aches, and a change in smell and taste which has changed since 07/24/2020.  Past medical history is significant for asthma, insulin-dependent diabetes, hypertension, GERD, COVID-19 infection in January 2021, depression and PTSD.  Patient has not taken anything over-the-counter for symptoms.  She has no other complaints or concerns.     Past Medical History:  Diagnosis Date  . Acute asthma exacerbation 04/10/2018  . Acute non-recurrent maxillary sinusitis 09/25/2018  . Asthma   . Auditory hallucinations    only after anesthesia  . BV (bacterial vaginosis) 02/13/2018  . Diabetes mellitus without complication (HCC)    diet controlled  . Diabetes mellitus, type II (HCC)    insulin, jardiance  . Dyspnea    with exertion  . Essential hypertension   . Essential hypertension 11/24/2017  . GERD (gastroesophageal reflux disease)    occasionally-NO MEDS  . History of placement of ear tubes 05/20/2018  . Hyperlipidemia   . MDD (major depressive disorder)   . Miscarriage   . Nausea & vomiting 10/05/2019  . PTSD (post-traumatic stress disorder)   . Right lower quadrant abdominal pain 02/13/2018  . Tachycardia      Patient Active Problem List   Diagnosis Date Noted  . Acute URI 07/26/2020  . COVID-19 virus infection 08/16/2019  . Kidney stone 02/11/2019  . Asthma 11/03/2018  . Recurrent sinusitis 05/20/2018  . Recurrent fever of unknown etiology 05/08/2018  . Recurrent URI (upper respiratory infection) 05/08/2018  . Tachycardia 05/04/2018  . Missed period 04/29/2018  . Diabetes mellitus type 2, uncontrolled ([place]) 11/24/2017  . Essential hypertension 11/24/2017  . Hyperlipidemia 11/24/2017  . Major depressive disorder, recurrent, severe without psychotic features ([place])   . PTSD (post-traumatic stress disorder) 04/11/2015    Past Surgical History:  Procedure Laterality Date  . ABDOMINAL HYSTERECTOMY    . ADENOIDECTOMY    . CHOLECYSTECTOMY  2009  . CYSTOSCOPY N/A 07/31/2018   Procedure: CYSTOSCOPY;  Surgeon: Benjaman Kindler, MD;  Location: [facility];  Service: Gynecology;  Laterality: N/A;  . LAPAROSCOPIC BILATERAL SALPINGECTOMY Bilateral 07/31/2018   Procedure: LAPAROSCOPIC BILATERAL SALPINGECTOMY;  Surgeon: Benjaman Kindler, MD;  Location: [facility];  Service: Gynecology;  Laterality: Bilateral;  . LAPAROSCOPIC HYSTERECTOMY N/A 07/31/2018   Procedure: HYSTERECTOMY TOTAL LAPAROSCOPIC;  Surgeon: Benjaman Kindler, MD;  Location: [facility];  Service: Gynecology;  Laterality: N/A;  . LAPAROSCOPY N/A 06/07/2019   Procedure: LAPAROSCOPY OPERATIVE, WITH PERITONEAL BIOPSIES;  Surgeon: Benjaman Kindler, MD;  Location: [facility];  Service: Gynecology;  Laterality: N/A;  . LYSIS OF ADHESION N/A 07/31/2018   Procedure: LYSIS OF ADHESION;  Surgeon: Benjaman Kindler, MD;  Location:  [facility];  Service: Gynecology;  Laterality: N/A;  . OVARY SURGERY Right    cyst removed a while ago  . TONSILLECTOMY    . tubes in ear      OB History   No obstetric history on file.      Home Medications    Prior to Admission medications   Medication Sig Start Date End Date Taking? Authorizing Provider  albuterol  (VENTOLIN HFA) 108 (90 Base) MCG/ACT inhaler INHALE 2 PUFFS BY MOUTH EVERY 6 HOURS AS NEEDED FOR SHORTNESS OF BREATH AND WHEEZING Patient taking differently: Inhale 2 puffs into the lungs every 6 (six) hours as needed for wheezing or shortness of breath. INHALE 2 PUFFS BY MOUTH EVERY 6 HOURS AS NEEDED FOR SHORTNESS OF BREATH AND WHEEZING 10/19/18  Yes Pleas Koch, NP  blood glucose meter kit and supplies KIT Dispense based on patient and insurance preference. Use up three times daily as directed. (FOR ICD-9 250.00, 250.01). 11/24/17  Yes Pleas Koch, NP  citalopram (CELEXA) 20 MG tablet Take 1 tablet (20 mg total) by mouth daily. For anxiety. 11/09/19  Yes Pleas Koch, NP  insulin glargine (LANTUS SOLOSTAR) 100 UNIT/ML Solostar Pen Inject 50 Units into the skin daily. 02/11/20  Yes Pleas Koch, NP  insulin lispro (HUMALOG KWIKPEN) 100 UNIT/ML KwikPen Inject 0.12 mLs (12 Units total) into the skin 3 (three) times daily. 02/08/20  Yes Pleas Koch, NP  Insulin Pen Needle (PEN NEEDLES) 31G X 6 MM MISC Use with insulin as directed. 02/02/20  Yes Pleas Koch, NP  losartan (COZAAR) 25 MG tablet Take 1 tablet (25 mg total) by mouth daily. For blood pressure. 02/08/20  Yes Pleas Koch, NP  montelukast (SINGULAIR) 10 MG tablet Take 10 mg by mouth at bedtime.    Yes [provider]  predniSONE (DELTASONE) 20 MG tablet Take 2 tablets (40 mg total) by mouth daily. For 3 days and 1 tab daily for 3 days 07/26/20  Yes Venia Carbon, MD  rosuvastatin (CRESTOR) 10 MG tablet Take 1 tablet (10 mg total) by mouth every evening. For cholesterol. 09/06/19  Yes Pleas Koch, NP  TRULICITY 1.5 FA/2.1HY SOPN ADMINISTER 1.5 MG UNDER THE SKIN 1 TIME A WEEK 01/31/20  Yes Pleas Koch, NP    Family History Family History  Problem Relation Age of Onset  . Asthma Mother   . Diabetes Mother   . Hyperlipidemia Mother   . Hypertension Mother   . Diabetes Father   .  Hyperlipidemia Father   . Hypertension Father   . Diabetes Brother   . Depression Brother   . Alcohol abuse Brother   . Depression Maternal Grandmother   . Stroke Paternal Grandmother   . Breast cancer Neg Hx     Social History Social History   Tobacco Use  . Smoking status: Never Smoker  . Smokeless tobacco: Never Used  Vaping Use  . Vaping Use: Never used  Substance Use Topics  . Alcohol use: No  . Drug use: No     Allergies   Ibuprofen, Ciprofloxacin, Metformin and related, Penicillins, Sulfa antibiotics, Other, and Shellfish allergy   Review of Systems Review of Systems   Physical Exam Triage Vital Signs ED Triage Vitals  Enc Vitals Group     BP 07/26/20 1604 138/86     Pulse Rate 07/26/20 1604 84     Resp 07/26/20 1604 18     Temp 07/26/20 1604 97.7 F (  36.5 C)     Temp Source 07/26/20 1604 Oral     SpO2 07/26/20 1604 99 %     Weight 07/26/20 1601 (!) 300 lb 0.7 oz (136.1 kg)     Height 07/26/20 1601 '5\' 6"'  (1.676 m)     Head Circumference --      Peak Flow --      Pain Score 07/26/20 1601 8     Pain Loc --      Pain Edu? --      Excl. in [place]? --    No data found.  Updated Vital Signs BP 138/86 (BP Location: Left Arm)   Pulse 84   Temp 97.7 F (36.5 C) (Oral)   Resp 18   Ht '5\' 6"'  (1.676 m)   Wt (!) 136.1 kg   LMP 07/24/2018 (Exact Date) Comment: surgery 07/31/2018  SpO2 99%   BMI 48.43 kg/m   Visual Acuity Right Eye Distance:   Left Eye Distance:   Bilateral Distance:    Right Eye Near:   Left Eye Near:    Bilateral Near:     Physical Exam   Constitutional:      General: She is not in acute distress.    Appearance: Normal appearance. She is obese. She is ill-appearing. She is not toxic-appearing.  HENT:     Head: Normocephalic and atraumatic.     Right Ear: Tympanic membrane, ear canal and external ear normal.     Left Ear: Tympanic membrane, ear canal and external ear normal.     Nose: Congestion and rhinorrhea (trace clear  drainage) present.     Mouth/Throat:     Mouth: Mucous membranes are moist.     Pharynx: Oropharynx is clear. Posterior oropharyngeal erythema (mild) present.  Eyes:     General: No scleral icterus.       Right eye: No discharge.        Left eye: No discharge.     Conjunctiva/sclera: Conjunctivae normal.  Cardiovascular:     Rate and Rhythm: Normal rate and regular rhythm.     Heart sounds: Normal heart sounds.  Pulmonary:     Effort: Pulmonary effort is normal. No respiratory distress.     Breath sounds: Normal breath sounds. No wheezing, rhonchi or rales.  Musculoskeletal:     Cervical back: Neck supple.  Skin:    General: Skin is dry.  Neurological:     General: No focal deficit present.     Mental Status: She is alert. Mental status is at baseline.     Motor: No weakness.     Gait: Gait normal.  Psychiatric:        Mood and Affect: Mood normal.        Behavior: Behavior normal.        Thought Content: Thought content normal.     UC Treatments / Results  Labs (all labs ordered are listed, but only abnormal results are displayed) Labs Reviewed  RESP PANEL BY RT-PCR (FLU A&B, COVID) [id number]    EKG   Radiology No results found.  Procedures Procedures (including critical care time)  Medications Ordered in UC Medications - No data to display  Initial Impression / Assessment and Plan / UC Course  I have reviewed the triage vital signs and the nursing notes.  Pertinent labs & imaging results that were available during my care of the patient were reviewed by me and considered in my medical decision making (see chart for details).  Clinical impression: 39 year old female presents with URI symptoms, myalgias, and a clinical presentation consistent with Covid.  She had a negative test on 07/24/2020 and a negative test today.  Despite this, she is ill-appearing.  Treatment plan 1 the findings and treatment plan were discussed in detail with the patient.  Patient was  in agreement. 2.  Once again her Covid test was negative, however given that she is unwell appearing I would get a go ahead and keep her out of work and reiterate the note that was given to her 2 days ago here in the office. 3.  Supportive care, over-the-counter meds as needed, plenty of rest, plenty fluids. 4.  Follow-up on an as-needed basis.  She should seek out immediate medical attention if respiratory symptoms were to worsen and go directly to an emergency room setting.   Final Clinical Impressions(s) / UC Diagnoses   Final diagnoses:  Viral URI with cough  Close exposure to COVID-19 virus     Discharge Instructions     Once again her Covid test was negative, however given that she is unwell appearing I would get a go ahead and keep her out of work and reiterate the note that was given to her 2 days ago here in the office. Supportive care, over-the-counter meds as needed, plenty of rest, plenty fluids. Follow-up on an as-needed basis.  She should seek out immediate medical attention if respiratory symptoms were to worsen and go directly to an emergency room setting.    ED Prescriptions    None     PDMP not reviewed this encounter.   Verda Cumins, MD 07/26/20 [phone]

## 2020-07-26 NOTE — Telephone Encounter (Signed)
Patient called back stating that she needs a work note stating that she needs to be out of work for 10 days due to [employer]. Thanks EM

## 2020-07-26 NOTE — Assessment & Plan Note (Signed)
Seems to have mild exacerbation Will give 6 days of prednisone (she will monitor sugars) Albuterol prn

## 2020-07-26 NOTE — Progress Notes (Signed)
Subjective:    Patient ID: Joan [name], female    DOB: 1981/06/13, 39 y.o.   MRN: [id number]  HPI Video virtual visit for respiratory infection Identification done Reviewed limitations and billing and she gave consent Participants--patient in her home and I am in my office  Woke 2 days ago--achy, headache and fever Yesterday lost taste and smell Asthma is flared --wheezing and cough. 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 albuterol (VENTOLIN HFA) 108 (90 Base) MCG/ACT inhaler INHALE 2 PUFFS BY MOUTH EVERY 6 HOURS AS NEEDED FOR SHORTNESS OF BREATH AND WHEEZING (Patient taking differently: Inhale 2 puffs into the lungs every 6 (six) hours as needed for wheezing or shortness of breath. INHALE 2 PUFFS BY MOUTH EVERY 6 HOURS AS NEEDED FOR SHORTNESS OF BREATH AND WHEEZING) 18 g 0  . blood glucose meter kit and supplies KIT Dispense based on patient and insurance preference. Use up three times daily as directed. (FOR ICD-9 250.00, 250.01). 1 each 0  . citalopram (CELEXA) 20 MG tablet Take 1 tablet (20 mg total) by mouth daily. For anxiety. 90 tablet 3  . insulin glargine (LANTUS SOLOSTAR) 100 UNIT/ML Solostar Pen Inject 50 Units into the skin daily. 30 mL 1  . insulin lispro (HUMALOG KWIKPEN) 100 UNIT/ML KwikPen Inject 0.12 mLs (12 Units total) into the skin 3 (three) times daily. 30 mL 1  . Insulin Pen Needle (PEN NEEDLES) 31G X 6 MM MISC Use with insulin as directed. 100 each 0  . losartan (COZAAR) 25 MG tablet Take 1 tablet (25 mg total) by mouth daily. For blood pressure. 90 tablet 3  . montelukast (SINGULAIR) 10 MG tablet Take 10 mg by mouth at  bedtime.     . rosuvastatin (CRESTOR) 10 MG tablet Take 1 tablet (10 mg total) by mouth every evening. For cholesterol. 90 tablet 3  . TRULICITY 1.5 PQ/3.3AQ SOPN ADMINISTER 1.5 MG UNDER THE SKIN 1 TIME A WEEK 2 mL 2   No current facility-administered medications on file prior to visit.    Allergies  Allergen Reactions  . Ibuprofen Swelling    Facial   . Ciprofloxacin Hives and Rash  . Metformin And Related Other (See Comments)    Elevated Lactic Acid  . Penicillins Hives and Rash    Has patient had a PCN reaction causing immediate rash, facial/tongue/throat swelling, SOB or lightheadedness with hypotension: No Has patient had a PCN reaction causing severe rash involving mucus membranes or skin necrosis: No Has patient had a PCN reaction that required hospitalization: No Has patient had a PCN reaction occurring within the last 10 years: No If all of the above answers are "NO", then may proceed with Cephalosporin use. THE PATIENT IS ABLE TO TOLERATE CEPHALOSPORINS WITHOUT DIFFIC  . Sulfa Antibiotics Hives and Rash  . Other     ALL NUTS-SCRATCHY THROAT  . Shellfish Allergy Other (See Comments)    ALL SEAFOOD-SCRATCHY THROAT    Past Medical History:  Diagnosis Date  . Acute asthma exacerbation 04/10/2018  . Acute non-recurrent maxillary sinusitis 09/25/2018  . Asthma   . Auditory hallucinations    only after anesthesia  .  BV (bacterial vaginosis) 02/13/2018  . Diabetes mellitus without complication (HCC)    diet controlled  . Diabetes mellitus, type II (HCC)    insulin, jardiance  . Dyspnea    with exertion  . Essential hypertension   . Essential hypertension 11/24/2017  . GERD (gastroesophageal reflux disease)    occasionally-NO MEDS  . History of placement of ear tubes 05/20/2018  . Hyperlipidemia   . MDD (major depressive disorder)   . Miscarriage   . Nausea & vomiting 10/05/2019  . PTSD (post-traumatic stress disorder)   . Right lower quadrant abdominal pain 02/13/2018   . Tachycardia     Past Surgical History:  Procedure Laterality Date  . ABDOMINAL HYSTERECTOMY    . ADENOIDECTOMY    . CHOLECYSTECTOMY  2009  . CYSTOSCOPY N/A 07/31/2018   Procedure: CYSTOSCOPY;  Surgeon: Benjaman Kindler, MD;  Location: [facility];  Service: Gynecology;  Laterality: N/A;  . LAPAROSCOPIC BILATERAL SALPINGECTOMY Bilateral 07/31/2018   Procedure: LAPAROSCOPIC BILATERAL SALPINGECTOMY;  Surgeon: Benjaman Kindler, MD;  Location: [facility];  Service: Gynecology;  Laterality: Bilateral;  . LAPAROSCOPIC HYSTERECTOMY N/A 07/31/2018   Procedure: HYSTERECTOMY TOTAL LAPAROSCOPIC;  Surgeon: Benjaman Kindler, MD;  Location: [facility];  Service: Gynecology;  Laterality: N/A;  . LAPAROSCOPY N/A 06/07/2019   Procedure: LAPAROSCOPY OPERATIVE, WITH PERITONEAL BIOPSIES;  Surgeon: Benjaman Kindler, MD;  Location: [facility];  Service: Gynecology;  Laterality: N/A;  . LYSIS OF ADHESION N/A 07/31/2018   Procedure: LYSIS OF ADHESION;  Surgeon: Benjaman Kindler, MD;  Location: [facility];  Service: Gynecology;  Laterality: N/A;  . OVARY SURGERY Right    cyst removed a while ago  . TONSILLECTOMY    . tubes in ear      Family History  Problem Relation Age of Onset  . Asthma Mother   . Diabetes Mother   . Hyperlipidemia Mother   . Hypertension Mother   . Diabetes Father   . Hyperlipidemia Father   . Hypertension Father   . Diabetes Brother   . Depression Brother   . Alcohol abuse Brother   . Depression Maternal Grandmother   . Stroke Paternal Grandmother   . Breast cancer Neg Hx     Social History   Socioeconomic History  . Marital status: Married    Spouse name: chip  . Number of children: 0  . Years of education: Not on file  . Highest education level: High school graduate  Occupational History  . Occupation: day care worker  Tobacco Use  . Smoking status: Never Smoker  . Smokeless tobacco: Never Used  Vaping Use  . Vaping Use: Never used  Substance and Sexual Activity  .  Alcohol use: No  . Drug use: No  . Sexual activity: Yes  Other Topics Concern  . Not on file  Social History Narrative  . Not on file   Social Determinants of Health   Financial Resource Strain: Not on file  Food Insecurity: Not on file  Transportation Needs: Not on file  Physical Activity: Not on file  Stress: Not on file  Social Connections: Not on file  Intimate Partner Violence: Not on file   Review of Systems Some nausea Not sleeping well Trying to eat--but gets nausea. Drinking okay    Objective:   Physical Exam Constitutional:      Comments: Looks miserable but no distress  Pulmonary:     Effort: Pulmonary effort is normal. No respiratory distress.     Comments: No obvious wheezing  Assessment & Plan:

## 2020-07-26 NOTE — Telephone Encounter (Signed)
Please see comments below the access nurse note. Pt already has video visit scheduled with Dr Alphonsus Sias 07/26/20 at 9:15.

## 2020-07-26 NOTE — Assessment & Plan Note (Signed)
Both flu and COVID tests negative on first day of illness---but I suspect delta COVID Continue rest, tylenol Would not repeat testing since I don't think she is high risk (with limited monoclonal antibodies now) Too late for tamiflu if false negative flu test  Will need note---may return to work 12/30 if symptoms gone next week

## 2020-07-26 NOTE — Telephone Encounter (Signed)
I am aware. Dr Alphonsus Sias sent me the chart.

## 2020-08-01 ENCOUNTER — Other Ambulatory Visit: Payer: Self-pay

## 2020-08-01 ENCOUNTER — Emergency Department: Payer: Self-pay

## 2020-08-01 ENCOUNTER — Encounter: Payer: Self-pay | Admitting: Emergency Medicine

## 2020-08-01 ENCOUNTER — Emergency Department
Admission: EM | Admit: 2020-08-01 | Discharge: 2020-08-01 | Disposition: A | Discharge status: Home / Self Care | Payer: Self-pay | Attending: Emergency Medicine | Admitting: Emergency Medicine

## 2020-08-01 DIAGNOSIS — Z20822 Contact with and (suspected) exposure to covid-19: Secondary | ICD-10-CM | POA: Insufficient documentation

## 2020-08-01 DIAGNOSIS — Z79899 Other long term (current) drug therapy: Secondary | ICD-10-CM | POA: Insufficient documentation

## 2020-08-01 DIAGNOSIS — E119 Type 2 diabetes mellitus without complications: Secondary | ICD-10-CM | POA: Insufficient documentation

## 2020-08-01 DIAGNOSIS — Z794 Long term (current) use of insulin: Secondary | ICD-10-CM | POA: Insufficient documentation

## 2020-08-01 DIAGNOSIS — I10 Essential (primary) hypertension: Secondary | ICD-10-CM | POA: Insufficient documentation

## 2020-08-01 DIAGNOSIS — J4521 Mild intermittent asthma with (acute) exacerbation: Secondary | ICD-10-CM | POA: Insufficient documentation

## 2020-08-01 DIAGNOSIS — J45901 Unspecified asthma with (acute) exacerbation: Secondary | ICD-10-CM

## 2020-08-01 LAB — RESP PANEL BY RT-PCR (FLU A&B, COVID) ARPGX2
Influenza A by PCR: NEGATIVE
Influenza B by PCR: NEGATIVE
SARS Coronavirus 2 by RT PCR: NEGATIVE

## 2020-08-01 MED ORDER — IPRATROPIUM-ALBUTEROL 0.5-2.5 (3) MG/3ML IN SOLN
3.0000 mL | Freq: Once | RESPIRATORY_TRACT | Status: AC
  Administered 2020-08-01: 3 mL via RESPIRATORY_TRACT
  Filled 2020-08-01: qty 3

## 2020-08-01 MED ORDER — PSEUDOEPH-BROMPHEN-DM 30-2-10 MG/5ML PO SYRP: 5.0000 mL | ORAL_SOLUTION | Freq: Four times a day (QID) | ORAL | 0 refills | Status: DC | PRN

## 2020-08-01 MED ORDER — PREDNISONE 10 MG PO TABS: ORAL_TABLET | ORAL | 0 refills | Status: DC

## 2020-08-01 MED ORDER — PREDNISONE 20 MG PO TABS
60.0000 mg | ORAL_TABLET | Freq: Once | ORAL | Status: AC
  Administered 2020-08-01: 60 mg via ORAL
  Filled 2020-08-01: qty 3

## 2020-08-01 NOTE — ED Triage Notes (Signed)
Pt states similar symptoms a week ago, states that she felt better over the weekend but her asthma has flared up again, pt reports that she is coughing, congested, wheezing has a headache, pt reports being tested for covid last Monday and Wednesday and each were negative. Pt reports using her inhaler but states that it isn't helping

## 2020-08-01 NOTE — ED Provider Notes (Signed)
[facility] Emergency Department Provider Note   ____________________________________________   Event Date/Time   First MD Initiated Contact with Patient 08/01/20 [phone]     (approximate)  I have reviewed the triage vital signs and the nursing notes.   HISTORY  Chief Complaint Wheezing and Cough   HPI Joan [name] is a 39 y.o. female presents to the emergency department with complaint of asthma flareup over the weekend.  Patient states she has been coughing, congested and wheezing.  She also complains of a headache.  Patient states that she has continued using her inhaler which does not help.  She took Covid test twice last week which both were negative.  Patient rates her pain as an 8 out of 10.  She denies any change in taste or smell.  There is been no nausea, vomiting or diarrhea.       Past Medical History:  Diagnosis Date  . Acute asthma exacerbation 04/10/2018  . Acute non-recurrent maxillary sinusitis 09/25/2018  . Asthma   . Auditory hallucinations    only after anesthesia  . BV (bacterial vaginosis) 02/13/2018  . Diabetes mellitus without complication (HCC)    diet controlled  . Diabetes mellitus, type II (HCC)    insulin, jardiance  . Dyspnea    with exertion  . Essential hypertension   . Essential hypertension 11/24/2017  . GERD (gastroesophageal reflux disease)    occasionally-NO MEDS  . History of placement of ear tubes 05/20/2018  . Hyperlipidemia   . MDD (major depressive disorder)   . Miscarriage   . Nausea & vomiting 10/05/2019  . PTSD (post-traumatic stress disorder)   . Right lower quadrant abdominal pain 02/13/2018  . Tachycardia     Patient Active Problem List   Diagnosis Date Noted  . Acute URI 07/26/2020  . COVID-19 virus infection 08/16/2019  . Kidney stone 02/11/2019  . Asthma 11/03/2018  . Recurrent sinusitis 05/20/2018  . Recurrent fever of unknown etiology 05/08/2018  . Recurrent URI (upper respiratory  infection) 05/08/2018  . Tachycardia 05/04/2018  . Missed period 04/29/2018  . Diabetes mellitus type 2, uncontrolled ([place]) 11/24/2017  . Essential hypertension 11/24/2017  . Hyperlipidemia 11/24/2017  . Major depressive disorder, recurrent, severe without psychotic features ([place])   . PTSD (post-traumatic stress disorder) 04/11/2015    Past Surgical History:  Procedure Laterality Date  . ABDOMINAL HYSTERECTOMY    . ADENOIDECTOMY    . CHOLECYSTECTOMY  2009  . CYSTOSCOPY N/A 07/31/2018   Procedure: CYSTOSCOPY;  Surgeon: Benjaman Kindler, MD;  Location: [facility] ORS;  Service: Gynecology;  Laterality: N/A;  . LAPAROSCOPIC BILATERAL SALPINGECTOMY Bilateral 07/31/2018   Procedure: LAPAROSCOPIC BILATERAL SALPINGECTOMY;  Surgeon: Benjaman Kindler, MD;  Location: [facility] ORS;  Service: Gynecology;  Laterality: Bilateral;  . LAPAROSCOPIC HYSTERECTOMY N/A 07/31/2018   Procedure: HYSTERECTOMY TOTAL LAPAROSCOPIC;  Surgeon: Benjaman Kindler, MD;  Location: [facility] ORS;  Service: Gynecology;  Laterality: N/A;  . LAPAROSCOPY N/A 06/07/2019   Procedure: LAPAROSCOPY OPERATIVE, WITH PERITONEAL BIOPSIES;  Surgeon: Benjaman Kindler, MD;  Location: [facility] ORS;  Service: Gynecology;  Laterality: N/A;  . LYSIS OF ADHESION N/A 07/31/2018   Procedure: LYSIS OF ADHESION;  Surgeon: Benjaman Kindler, MD;  Location: [facility] ORS;  Service: Gynecology;  Laterality: N/A;  . OVARY SURGERY Right    cyst removed a while ago  . TONSILLECTOMY    . tubes in ear      Prior to Admission medications   Medication Sig Start Date End Date Taking? Authorizing Provider  brompheniramine-pseudoephedrine-DM 30-2-10 MG/5ML syrup Take 5 mLs by mouth 4 (four) times daily as needed. 08/01/20  Yes Johnn Hai, PA-C  predniSONE (DELTASONE) 10 MG tablet Take 5 tablets  tomorrow, on day 2 take 4 tablets, day 3 take 3 tablets, day 4 take 2 tablets, day 5 take  1 tablets 08/01/20  Yes Trevonte Ashkar L, PA-C  albuterol (VENTOLIN HFA) 108 (90 Base)  MCG/ACT inhaler INHALE 2 PUFFS BY MOUTH EVERY 6 HOURS AS NEEDED FOR SHORTNESS OF BREATH AND WHEEZING Patient taking differently: Inhale 2 puffs into the lungs every 6 (six) hours as needed for wheezing or shortness of breath. INHALE 2 PUFFS BY MOUTH EVERY 6 HOURS AS NEEDED FOR SHORTNESS OF BREATH AND WHEEZING 10/19/18   Pleas Koch, NP  blood glucose meter kit and supplies KIT Dispense based on patient and insurance preference. Use up three times daily as directed. (FOR ICD-9 250.00, 250.01). 11/24/17   Pleas Koch, NP  citalopram (CELEXA) 20 MG tablet Take 1 tablet (20 mg total) by mouth daily. For anxiety. 11/09/19   Pleas Koch, NP  insulin glargine (LANTUS SOLOSTAR) 100 UNIT/ML Solostar Pen Inject 50 Units into the skin daily. 02/11/20   Pleas Koch, NP  insulin lispro (HUMALOG KWIKPEN) 100 UNIT/ML KwikPen Inject 0.12 mLs (12 Units total) into the skin 3 (three) times daily. 02/08/20   Pleas Koch, NP  Insulin Pen Needle (PEN NEEDLES) 31G X 6 MM MISC Use with insulin as directed. 02/02/20   Pleas Koch, NP  losartan (COZAAR) 25 MG tablet Take 1 tablet (25 mg total) by mouth daily. For blood pressure. 02/08/20   Pleas Koch, NP  montelukast (SINGULAIR) 10 MG tablet Take 10 mg by mouth at bedtime.     [provider]  rosuvastatin (CRESTOR) 10 MG tablet Take 1 tablet (10 mg total) by mouth every evening. For cholesterol. 09/06/19   Pleas Koch, NP  TRULICITY 1.5 HO/1.2YQ SOPN ADMINISTER 1.5 MG UNDER THE SKIN 1 TIME A WEEK 01/31/20   Pleas Koch, NP    Allergies Ibuprofen, Ciprofloxacin, Metformin and related, Penicillins, Sulfa antibiotics, Other, and Shellfish allergy  Family History  Problem Relation Age of Onset  . Asthma Mother   . Diabetes Mother   . Hyperlipidemia Mother   . Hypertension Mother   . Diabetes Father   . Hyperlipidemia Father   . Hypertension Father   . Diabetes Brother   . Depression Brother   . Alcohol abuse  Brother   . Depression Maternal Grandmother   . Stroke Paternal Grandmother   . Breast cancer Neg Hx     Social History Social History   Tobacco Use  . Smoking status: Never Smoker  . Smokeless tobacco: Never Used  Vaping Use  . Vaping Use: Never used  Substance Use Topics  . Alcohol use: No  . Drug use: No    Review of Systems Constitutional: No fever/chills Eyes: No visual changes. ENT: No sore throat. Cardiovascular: Denies chest pain. Respiratory: Denies shortness of breath.  Positive for coughing, congestion and wheezing. Gastrointestinal: No abdominal pain.  No nausea, no vomiting.  No diarrhea.   Genitourinary: Negative for dysuria. Musculoskeletal: Negative for muscle aches. Skin: Negative for rash. Neurological: Negative for headaches, focal weakness or numbness. ____________________________________________   PHYSICAL EXAM:  VITAL SIGNS: ED Triage Vitals  Enc Vitals Group     BP --      Pulse Rate 08/01/20 0918 97  Resp 08/01/20 0918 18     Temp 08/01/20 0918 97.8 F (36.6 C)     Temp Source 08/01/20 0918 Oral     SpO2 08/01/20 0918 97 %     Weight 08/01/20 0920 (!) 300 lb 0.7 oz (136.1 kg)     Height 08/01/20 0920 _0  (1.676 m)     Head Circumference --      Peak Flow --      Pain Score 08/01/20 0919 8     Pain Loc --      Pain Edu? --      Excl. in [place]? --     Constitutional: Alert and oriented. Well appearing and in no acute distress.  Patient is able to talk in complete sentences without any difficulty or shortness of breath. Eyes: Conjunctivae are normal. PERRL. EOMI. Head: Atraumatic. Nose: No congestion/rhinnorhea. Mouth/Throat: Mucous membranes are moist.  Oropharynx non-erythematous. Neck: No stridor.   Cardiovascular: Normal rate, regular rhythm. Grossly normal heart sounds.  Good peripheral circulation. Respiratory: Normal respiratory effort.  No retractions. Lungs no expiratory wheezes are noted throughout that patient does have a  coarse congested cough that is noted occasionally. Gastrointestinal: Soft and nontender. No distention.  Musculoskeletal: Moves upper and lower extremities without any difficulty.  Normal gait was noted. Neurologic:  Normal speech and language. No gross focal neurologic deficits are appreciated. No gait instability. Skin:  Skin is warm, dry and intact. No rash noted. Psychiatric: Mood and affect are normal. Speech and behavior are normal.  ____________________________________________   LABS (all labs ordered are listed, but only abnormal results are displayed)  Labs Reviewed  RESP PANEL BY RT-PCR (FLU A&B, COVID) [id number]    RADIOLOGY I, Johnn Hai, personally viewed and evaluated these images (plain radiographs) as part of my medical decision making, as well as reviewing the written report by the radiologist.   Official radiology report(s): DG Chest 2 View  Result Date: 08/01/2020 CLINICAL DATA:  39 year old female with recurrent cough and congestion. Tested negative for COVID-19 last week. EXAM: CHEST - 2 VIEW COMPARISON:  Chest radiographs 07/24/2020 and earlier. FINDINGS: Lung volumes and mediastinal contours are stable and within normal limits. Visualized tracheal air column is within normal limits. No pneumothorax, pulmonary edema, pleural effusion or confluent pulmonary opacity. Lung markings appear stable since July. No acute osseous abnormality identified. Stable upper abdominal cholecystectomy clips. Negative visible bowel gas pattern. IMPRESSION: No acute cardiopulmonary abnormality. Electronically Signed   By: Genevie Ann M.D.   On: 08/01/2020 10:43   EKG: Reviewed by doctor in [facility] EKG shows normal sinus rhythm with ventricular rate of 89. ____________________________________________   PROCEDURES  Procedure(s) performed (including Critical Care):  Procedures   ____________________________________________   INITIAL IMPRESSION / ASSESSMENT AND PLAN / ED  COURSE  As part of my medical decision making, I reviewed the following data within the electronic MEDICAL RECORD NUMBER Notes from prior ED visits and [place] Controlled Substance Database  39 year old female presents to the ED with complaint of exacerbation of her asthma.  Patient denies any fever, chills, nausea or vomiting.  She is unaware of any Covid exposure.  Patient has been using her inhaler and does have a history of asthma.  Chest x-ray was negative for pneumonia and also the respiratory panel was negative for Covid and influenza.  Patient was made aware.  Patient was given prednisone 60 mg p.o. and and DuoNeb nebulizer treatment and improved.  Patient is continue with her routine inhaler.  A prescription for prednisone was sent to her pharmacy along with Bromfed-DM as needed for cough and congestion.  Patient is encouraged to increase fluids and follow-up with her PCP if any continued problems.  ____________________________________________   FINAL CLINICAL IMPRESSION(S) / ED DIAGNOSES  Final diagnoses:  Mild asthma with exacerbation, unspecified whether persistent     ED Discharge Orders         Ordered    predniSONE (DELTASONE) 10 MG tablet        08/01/20 1151    brompheniramine-pseudoephedrine-DM 30-2-10 MG/5ML syrup  4 times daily PRN        08/01/20 1151          *Please note:  Joan [name] was evaluated in Emergency Department on 08/01/2020 for the symptoms described in the history of present illness. She was evaluated in the context of the global COVID-19 pandemic, which necessitated consideration that the patient might be at risk for infection with the SARS-CoV-2 virus that causes COVID-19. Institutional protocols and algorithms that pertain to the evaluation of patients at risk for COVID-19 are in a state of rapid change based on information released by regulatory bodies including the CDC and federal and state organizations. These policies and algorithms were followed during  the patient's care in the ED.  Some ED evaluations and interventions may be delayed as a result of limited staffing during and the pandemic.*   Note:  This document was prepared using Dragon voice recognition software and may include unintentional dictation errors.    Johnn Hai, PA-C 08/01/20 1337    Harvest Dark, MD 08/01/20 1445

## 2020-08-01 NOTE — ED Notes (Signed)
Patient verbalizes understanding of discharge instructions. Opportunity for questioning and answers were provided. Armband removed by staff, pt discharged from ED. Ambulated out to lobby  

## 2020-08-01 NOTE — Discharge Instructions (Signed)
Follow-up with your primary care provider if any continued problems.  Continue using your inhaler every 4-6 hours as needed for wheezing and shortness of breath.  A prescription for Bromfed-DM was sent to your pharmacy along with a prescription for continued prednisone use starting tomorrow.  Increase fluids.

## 2020-08-03 ENCOUNTER — Encounter: Payer: Self-pay | Admitting: Emergency Medicine

## 2020-08-03 ENCOUNTER — Other Ambulatory Visit: Payer: Self-pay

## 2020-08-03 ENCOUNTER — Inpatient Hospital Stay
Admission: EM | Admit: 2020-08-03 | Discharge: 2020-08-06 | DRG: 202 | Disposition: A | Discharge status: Home / Self Care | Payer: Self-pay | Attending: Internal Medicine | Admitting: Internal Medicine

## 2020-08-03 ENCOUNTER — Emergency Department: Payer: Self-pay

## 2020-08-03 DIAGNOSIS — Z8616 Personal history of COVID-19: Secondary | ICD-10-CM

## 2020-08-03 DIAGNOSIS — E1165 Type 2 diabetes mellitus with hyperglycemia: Secondary | ICD-10-CM | POA: Diagnosis present

## 2020-08-03 DIAGNOSIS — Z8249 Family history of ischemic heart disease and other diseases of the circulatory system: Secondary | ICD-10-CM

## 2020-08-03 DIAGNOSIS — Z823 Family history of stroke: Secondary | ICD-10-CM

## 2020-08-03 DIAGNOSIS — Z886 Allergy status to analgesic agent status: Secondary | ICD-10-CM

## 2020-08-03 DIAGNOSIS — Z83438 Family history of other disorder of lipoprotein metabolism and other lipidemia: Secondary | ICD-10-CM

## 2020-08-03 DIAGNOSIS — R739 Hyperglycemia, unspecified: Secondary | ICD-10-CM

## 2020-08-03 DIAGNOSIS — J4541 Moderate persistent asthma with (acute) exacerbation: Principal | ICD-10-CM | POA: Diagnosis present

## 2020-08-03 DIAGNOSIS — F4310 Post-traumatic stress disorder, unspecified: Secondary | ICD-10-CM | POA: Diagnosis present

## 2020-08-03 DIAGNOSIS — I10 Essential (primary) hypertension: Secondary | ICD-10-CM | POA: Diagnosis present

## 2020-08-03 DIAGNOSIS — Z888 Allergy status to other drugs, medicaments and biological substances status: Secondary | ICD-10-CM

## 2020-08-03 DIAGNOSIS — Z881 Allergy status to other antibiotic agents status: Secondary | ICD-10-CM

## 2020-08-03 DIAGNOSIS — Z880 Allergy status to penicillin: Secondary | ICD-10-CM

## 2020-08-03 DIAGNOSIS — T383X6A Underdosing of insulin and oral hypoglycemic [antidiabetic] drugs, initial encounter: Secondary | ICD-10-CM | POA: Diagnosis present

## 2020-08-03 DIAGNOSIS — Z825 Family history of asthma and other chronic lower respiratory diseases: Secondary | ICD-10-CM

## 2020-08-03 DIAGNOSIS — Z91120 Patient's intentional underdosing of medication regimen due to financial hardship: Secondary | ICD-10-CM

## 2020-08-03 DIAGNOSIS — J4521 Mild intermittent asthma with (acute) exacerbation: Secondary | ICD-10-CM

## 2020-08-03 DIAGNOSIS — Z6841 Body Mass Index (BMI) 40.0 and over, adult: Secondary | ICD-10-CM

## 2020-08-03 DIAGNOSIS — F329 Major depressive disorder, single episode, unspecified: Secondary | ICD-10-CM | POA: Diagnosis present

## 2020-08-03 DIAGNOSIS — Z833 Family history of diabetes mellitus: Secondary | ICD-10-CM

## 2020-08-03 DIAGNOSIS — J45901 Unspecified asthma with (acute) exacerbation: Secondary | ICD-10-CM

## 2020-08-03 DIAGNOSIS — Z91013 Allergy to seafood: Secondary | ICD-10-CM

## 2020-08-03 DIAGNOSIS — E871 Hypo-osmolality and hyponatremia: Secondary | ICD-10-CM | POA: Diagnosis present

## 2020-08-03 DIAGNOSIS — E785 Hyperlipidemia, unspecified: Secondary | ICD-10-CM | POA: Diagnosis present

## 2020-08-03 HISTORY — DX: Unspecified asthma with (acute) exacerbation: J45.901

## 2020-08-03 LAB — CBC
HCT: 43.7 % (ref 36.0–46.0)
HCT: 42.3 % (ref 36.0–46.0)
Hemoglobin: 14.6 g/dL (ref 12.0–15.0)
Hemoglobin: 14.2 g/dL (ref 12.0–15.0)
MCH: 28 pg (ref 26.0–34.0)
MCH: 28.3 pg (ref 26.0–34.0)
MCHC: 33.4 g/dL (ref 30.0–36.0)
MCHC: 33.6 g/dL (ref 30.0–36.0)
MCV: 83.7 fL (ref 80.0–100.0)
MCV: 84.3 fL (ref 80.0–100.0)
Platelets: 275 10*3/uL (ref 150–400)
Platelets: 266 10*3/uL (ref 150–400)
RBC: 5.22 MIL/uL — ABNORMAL HIGH (ref 3.87–5.11)
RBC: 5.02 MIL/uL (ref 3.87–5.11)
RDW: 12.8 % (ref 11.5–15.5)
RDW: 12.9 % (ref 11.5–15.5)
WBC: 6 10*3/uL (ref 4.0–10.5)
WBC: 7.6 10*3/uL (ref 4.0–10.5)
nRBC: 0 % (ref 0.0–0.2)
nRBC: 0 % (ref 0.0–0.2)

## 2020-08-03 LAB — RESP PANEL BY RT-PCR (FLU A&B, COVID) ARPGX2
Influenza A by PCR: NEGATIVE
Influenza B by PCR: NEGATIVE
SARS Coronavirus 2 by RT PCR: NEGATIVE

## 2020-08-03 LAB — TROPONIN I (HIGH SENSITIVITY)
Troponin I (High Sensitivity): 5 ng/L (ref <18)
Troponin I (High Sensitivity): 5 ng/L (ref <18)

## 2020-08-03 LAB — BASIC METABOLIC PANEL
Anion gap: 9 (ref 5–15)
BUN: 10 mg/dL (ref 6–20)
CO2: 27 mmol/L (ref 22–32)
Calcium: 9.5 mg/dL (ref 8.9–10.3)
Chloride: 94 mmol/L — ABNORMAL LOW (ref 98–111)
Creatinine, Ser: 0.63 mg/dL (ref 0.44–1.00)
GFR, Estimated: >60 mL/min (ref >60)
Glucose, Bld: 493 mg/dL — ABNORMAL HIGH (ref 70–99)
Potassium: 4.1 mmol/L (ref 3.5–5.1)
Sodium: 130 mmol/L — ABNORMAL LOW (ref 135–145)

## 2020-08-03 LAB — MAGNESIUM: Magnesium: 1.9 mg/dL (ref 1.7–2.4)

## 2020-08-03 LAB — TSH: TSH: 1.211 u[IU]/mL (ref 0.350–4.500)

## 2020-08-03 LAB — BLOOD GAS, ARTERIAL
Acid-base deficit: 3.6 mmol/L — ABNORMAL HIGH (ref 0.0–2.0)
Bicarbonate: 20.6 mmol/L (ref 20.0–28.0)
FIO2: 0.21
O2 Saturation: 96.4 %
Patient temperature: 37
pCO2 arterial: 34 mmHg (ref 32.0–48.0)
pH, Arterial: 7.39 (ref 7.350–7.450)
pO2, Arterial: 86 mmHg (ref 83.0–108.0)

## 2020-08-03 LAB — CREATININE, SERUM
Creatinine, Ser: 0.9 mg/dL (ref 0.44–1.00)
GFR, Estimated: >60 mL/min (ref >60)

## 2020-08-03 LAB — POC SARS CORONAVIRUS 2 AG -  ED: SARS Coronavirus 2 Ag: NEGATIVE

## 2020-08-03 LAB — FIBRIN DERIVATIVES D-DIMER (ARMC ONLY): Fibrin derivatives D-dimer (ARMC): 152.95 ng/mL (FEU) (ref 0.00–499.00)

## 2020-08-03 MED ORDER — MAGNESIUM SULFATE 2 GM/50ML IV SOLN
2.0000 g | Freq: Once | INTRAVENOUS | Status: AC
  Administered 2020-08-03: 2 g via INTRAVENOUS
  Filled 2020-08-03: qty 50

## 2020-08-03 MED ORDER — ONDANSETRON HCL 4 MG/2ML IJ SOLN
4.0000 mg | Freq: Once | INTRAMUSCULAR | Status: AC
  Administered 2020-08-03: 4 mg via INTRAVENOUS
  Filled 2020-08-03: qty 2

## 2020-08-03 MED ORDER — INSULIN GLARGINE 100 UNIT/ML ~~LOC~~ SOLN
50.0000 [IU] | Freq: Every day | SUBCUTANEOUS | Status: DC
  Filled 2020-08-03: qty 0.5

## 2020-08-03 MED ORDER — ACETAMINOPHEN 650 MG RE SUPP: 650.0000 mg | Freq: Four times a day (QID) | RECTAL | Status: DC | PRN

## 2020-08-03 MED ORDER — ENOXAPARIN SODIUM 40 MG/0.4ML ~~LOC~~ SOLN: 40.0000 mg | SUBCUTANEOUS | Status: DC

## 2020-08-03 MED ORDER — IPRATROPIUM-ALBUTEROL 0.5-2.5 (3) MG/3ML IN SOLN
3.0000 mL | RESPIRATORY_TRACT | Status: DC
  Administered 2020-08-03 – 2020-08-06 (×13): 3 mL via RESPIRATORY_TRACT
  Filled 2020-08-03 (×14): qty 3

## 2020-08-03 MED ORDER — INSULIN ASPART 100 UNIT/ML ~~LOC~~ SOLN
12.0000 [IU] | Freq: Three times a day (TID) | SUBCUTANEOUS | Status: DC
  Administered 2020-08-04: 12 [IU] via SUBCUTANEOUS
  Filled 2020-08-03: qty 1

## 2020-08-03 MED ORDER — CITALOPRAM HYDROBROMIDE 20 MG PO TABS
20.0000 mg | ORAL_TABLET | Freq: Every day | ORAL | Status: DC
  Administered 2020-08-04 – 2020-08-06 (×3): 20 mg via ORAL
  Filled 2020-08-03 (×3): qty 1

## 2020-08-03 MED ORDER — IPRATROPIUM-ALBUTEROL 0.5-2.5 (3) MG/3ML IN SOLN: 3.0000 mL | Freq: Once | RESPIRATORY_TRACT | Status: DC

## 2020-08-03 MED ORDER — SODIUM CHLORIDE 0.9% FLUSH
3.0000 mL | Freq: Two times a day (BID) | INTRAVENOUS | Status: DC
  Administered 2020-08-04 – 2020-08-06 (×6): 3 mL via INTRAVENOUS

## 2020-08-03 MED ORDER — ALBUTEROL SULFATE (2.5 MG/3ML) 0.083% IN NEBU: 2.5000 mg | INHALATION_SOLUTION | RESPIRATORY_TRACT | Status: DC | PRN

## 2020-08-03 MED ORDER — ONDANSETRON HCL 4 MG PO TABS: 4.0000 mg | ORAL_TABLET | Freq: Four times a day (QID) | ORAL | Status: DC | PRN

## 2020-08-03 MED ORDER — SODIUM CHLORIDE 0.9 % IV BOLUS
1000.0000 mL | Freq: Once | INTRAVENOUS | Status: AC
  Administered 2020-08-03: 1000 mL via INTRAVENOUS

## 2020-08-03 MED ORDER — ONDANSETRON HCL 4 MG/2ML IJ SOLN: 4.0000 mg | Freq: Four times a day (QID) | INTRAMUSCULAR | Status: DC | PRN

## 2020-08-03 MED ORDER — IPRATROPIUM-ALBUTEROL 0.5-2.5 (3) MG/3ML IN SOLN
6.0000 mL | Freq: Once | RESPIRATORY_TRACT | Status: AC
  Administered 2020-08-03: 6 mL via RESPIRATORY_TRACT
  Filled 2020-08-03: qty 6

## 2020-08-03 MED ORDER — ENOXAPARIN SODIUM 80 MG/0.8ML ~~LOC~~ SOLN
0.5000 mg/kg | SUBCUTANEOUS | Status: DC
  Administered 2020-08-04 – 2020-08-05 (×2): 67.5 mg via SUBCUTANEOUS
  Filled 2020-08-03 (×4): qty 0.8

## 2020-08-03 MED ORDER — LOSARTAN POTASSIUM 25 MG PO TABS
25.0000 mg | ORAL_TABLET | Freq: Every day | ORAL | Status: DC
  Administered 2020-08-04 – 2020-08-06 (×3): 25 mg via ORAL
  Filled 2020-08-03 (×3): qty 1

## 2020-08-03 MED ORDER — MONTELUKAST SODIUM 10 MG PO TABS
10.0000 mg | ORAL_TABLET | Freq: Every day | ORAL | Status: DC
  Administered 2020-08-04 – 2020-08-05 (×3): 10 mg via ORAL
  Filled 2020-08-03 (×4): qty 1

## 2020-08-03 MED ORDER — GUAIFENESIN ER 600 MG PO TB12
600.0000 mg | ORAL_TABLET | Freq: Two times a day (BID) | ORAL | Status: DC
  Administered 2020-08-04 – 2020-08-06 (×6): 600 mg via ORAL
  Filled 2020-08-03 (×6): qty 1

## 2020-08-03 MED ORDER — ACETAMINOPHEN 325 MG PO TABS
650.0000 mg | ORAL_TABLET | Freq: Four times a day (QID) | ORAL | Status: DC | PRN
  Administered 2020-08-05 – 2020-08-06 (×2): 650 mg via ORAL
  Filled 2020-08-03 (×2): qty 2

## 2020-08-03 MED ORDER — INSULIN LISPRO (1 UNIT DIAL) 100 UNIT/ML (KWIKPEN)
12.0000 [IU] | PEN_INJECTOR | Freq: Three times a day (TID) | SUBCUTANEOUS | Status: DC
  Filled 2020-08-03: qty 3

## 2020-08-03 MED ORDER — METHYLPREDNISOLONE SODIUM SUCC 125 MG IJ SOLR
60.0000 mg | Freq: Two times a day (BID) | INTRAMUSCULAR | Status: DC
  Filled 2020-08-03 (×2): qty 2

## 2020-08-03 MED ORDER — IPRATROPIUM-ALBUTEROL 0.5-2.5 (3) MG/3ML IN SOLN
3.0000 mL | Freq: Once | RESPIRATORY_TRACT | Status: AC
  Administered 2020-08-03: 3 mL via RESPIRATORY_TRACT
  Filled 2020-08-03: qty 3

## 2020-08-03 MED ORDER — METHYLPREDNISOLONE SODIUM SUCC 125 MG IJ SOLR
125.0000 mg | Freq: Once | INTRAMUSCULAR | Status: AC
  Administered 2020-08-03: 125 mg via INTRAVENOUS
  Filled 2020-08-03: qty 2

## 2020-08-03 MED ORDER — ROSUVASTATIN CALCIUM 10 MG PO TABS
10.0000 mg | ORAL_TABLET | Freq: Every evening | ORAL | Status: DC
  Administered 2020-08-04 – 2020-08-05 (×3): 10 mg via ORAL
  Filled 2020-08-03 (×4): qty 1

## 2020-08-03 MED ORDER — INSULIN GLARGINE 100 UNIT/ML SOLOSTAR PEN: 50.0000 [IU] | PEN_INJECTOR | Freq: Every day | SUBCUTANEOUS | Status: DC

## 2020-08-03 NOTE — ED Notes (Signed)
Handoff report given to Elijah Birk, Charity fundraiser.

## 2020-08-03 NOTE — Telephone Encounter (Signed)
Can you triage her. She may need to go back to ER

## 2020-08-03 NOTE — Progress Notes (Signed)
PHARMACIST - PHYSICIAN COMMUNICATION  CONCERNING:  Enoxaparin (Lovenox) for DVT Prophylaxis    RECOMMENDATION: Patient was prescribed enoxaprin 40mg  q24 hours for VTE prophylaxis.   Filed Weights   08/03/20 0806  Weight: (!) 136.1 kg (300 lb 0.7 oz)    Body mass index is 48.43 kg/m.  Estimated Creatinine Clearance: 132.8 mL/min (by C-G formula based on SCr of 0.63 mg/dL).   Based on [facility] policy patient is candidate for enoxaparin 0.5mg /kg TBW SQ every 24 hours based on BMI being >30.  DESCRIPTION: Pharmacy has adjusted enoxaparin dose per [facility] policy.  Patient is now receiving enoxaparin 67.5 mg every 24 hours    [facility], PharmD, BCPS 08/03/2020 5:46 PM

## 2020-08-03 NOTE — ED Notes (Signed)
Pt reports little to no improvement in symptoms. Still states tightness in chest, still sounding hoarse. Pt denies improvement to breathing  Pt face and neck does appear slightly reddened, unsure if patient came in like this. Pt does not know if she was red in the face/neck earlier. No earlier notes from triage or otherwise to compare

## 2020-08-03 NOTE — ED Notes (Signed)
Confirmed with EDP that pt was red in face and neck when she arrived. EDP states he suspects this is an asthma exacerbation and pt will be d/c'd after this round of duonebs

## 2020-08-03 NOTE — ED Provider Notes (Signed)
[facility] Emergency Department Provider Note ____________________________________________   Event Date/Time   First MD Initiated Contact with Patient 08/03/20 1248     (approximate)  I have reviewed the triage vital signs and the nursing notes.  HISTORY  Chief Complaint Chest Pain and Shortness of Breath   HPI Joan [name] is a 39 y.o. femalewho presents to the ED for evaluation of shortness of breath, chest tightness and nonproductive cough.  Chart review indicates history obesity and asthma.  Patient was seen 2 days ago in our fast track area and diagnosed with an asthma exacerbation, discharged with steroids.  Patient returns the ED today with progressively worsening chest tightness, shortness of breath and nonproductive cough.  She reports transient and minimal improvement with her home albuterol inhaler.  Denies having additional controller inhaler medications.  She reports compliance with her steroids the past 2 days, but has not taken it yet today.  Denies fevers, productive cough, syncope, emesis, abdominal pain, diarrhea, dysuria.   Past Medical History:  Diagnosis Date  . Acute asthma exacerbation 04/10/2018  . Acute non-recurrent maxillary sinusitis 09/25/2018  . Asthma   . Auditory hallucinations    only after anesthesia  . BV (bacterial vaginosis) 02/13/2018  . Diabetes mellitus without complication (HCC)    diet controlled  . Diabetes mellitus, type II (HCC)    insulin, jardiance  . Dyspnea    with exertion  . Essential hypertension   . Essential hypertension 11/24/2017  . GERD (gastroesophageal reflux disease)    occasionally-NO MEDS  . History of placement of ear tubes 05/20/2018  . Hyperlipidemia   . MDD (major depressive disorder)   . Miscarriage   . Nausea & vomiting 10/05/2019  . PTSD (post-traumatic stress disorder)   . Right lower quadrant abdominal pain 02/13/2018  . Tachycardia     Patient Active Problem List    Diagnosis Date Noted  . Acute URI 07/26/2020  . COVID-19 virus infection 08/16/2019  . Kidney stone 02/11/2019  . Asthma 11/03/2018  . Recurrent sinusitis 05/20/2018  . Recurrent fever of unknown etiology 05/08/2018  . Recurrent URI (upper respiratory infection) 05/08/2018  . Tachycardia 05/04/2018  . Missed period 04/29/2018  . Diabetes mellitus type 2, uncontrolled ([place]) 11/24/2017  . Essential hypertension 11/24/2017  . Hyperlipidemia 11/24/2017  . Major depressive disorder, recurrent, severe without psychotic features ([place])   . PTSD (post-traumatic stress disorder) 04/11/2015    Past Surgical History:  Procedure Laterality Date  . ABDOMINAL HYSTERECTOMY    . ADENOIDECTOMY    . CHOLECYSTECTOMY  2009  . CYSTOSCOPY N/A 07/31/2018   Procedure: CYSTOSCOPY;  Surgeon: Benjaman Kindler, MD;  Location: [facility] ORS;  Service: Gynecology;  Laterality: N/A;  . LAPAROSCOPIC BILATERAL SALPINGECTOMY Bilateral 07/31/2018   Procedure: LAPAROSCOPIC BILATERAL SALPINGECTOMY;  Surgeon: Benjaman Kindler, MD;  Location: [facility] ORS;  Service: Gynecology;  Laterality: Bilateral;  . LAPAROSCOPIC HYSTERECTOMY N/A 07/31/2018   Procedure: HYSTERECTOMY TOTAL LAPAROSCOPIC;  Surgeon: Benjaman Kindler, MD;  Location: [facility] ORS;  Service: Gynecology;  Laterality: N/A;  . LAPAROSCOPY N/A 06/07/2019   Procedure: LAPAROSCOPY OPERATIVE, WITH PERITONEAL BIOPSIES;  Surgeon: Benjaman Kindler, MD;  Location: [facility] ORS;  Service: Gynecology;  Laterality: N/A;  . LYSIS OF ADHESION N/A 07/31/2018   Procedure: LYSIS OF ADHESION;  Surgeon: Benjaman Kindler, MD;  Location: [facility] ORS;  Service: Gynecology;  Laterality: N/A;  . OVARY SURGERY Right    cyst removed a while ago  . TONSILLECTOMY    . tubes in ear  Prior to Admission medications   Medication Sig Start Date End Date Taking? Authorizing Provider  albuterol (VENTOLIN HFA) 108 (90 Base) MCG/ACT inhaler INHALE 2 PUFFS BY MOUTH EVERY 6 HOURS AS NEEDED FOR SHORTNESS OF  BREATH AND WHEEZING Patient taking differently: Inhale 2 puffs into the lungs every 6 (six) hours as needed for wheezing or shortness of breath. INHALE 2 PUFFS BY MOUTH EVERY 6 HOURS AS NEEDED FOR SHORTNESS OF BREATH AND WHEEZING 10/19/18   Pleas Koch, NP  blood glucose meter kit and supplies KIT Dispense based on patient and insurance preference. Use up three times daily as directed. (FOR ICD-9 250.00, 250.01). 11/24/17   Pleas Koch, NP  brompheniramine-pseudoephedrine-DM 30-2-10 MG/5ML syrup Take 5 mLs by mouth 4 (four) times daily as needed. 08/01/20   Johnn Hai, PA-C  citalopram (CELEXA) 20 MG tablet Take 1 tablet (20 mg total) by mouth daily. For anxiety. 11/09/19   Pleas Koch, NP  insulin glargine (LANTUS SOLOSTAR) 100 UNIT/ML Solostar Pen Inject 50 Units into the skin daily. 02/11/20   Pleas Koch, NP  insulin lispro (HUMALOG KWIKPEN) 100 UNIT/ML KwikPen Inject 0.12 mLs (12 Units total) into the skin 3 (three) times daily. 02/08/20   Pleas Koch, NP  Insulin Pen Needle (PEN NEEDLES) 31G X 6 MM MISC Use with insulin as directed. 02/02/20   Pleas Koch, NP  losartan (COZAAR) 25 MG tablet Take 1 tablet (25 mg total) by mouth daily. For blood pressure. 02/08/20   Pleas Koch, NP  montelukast (SINGULAIR) 10 MG tablet Take 10 mg by mouth at bedtime.     [provider]  predniSONE (DELTASONE) 10 MG tablet Take 5 tablets  tomorrow, on day 2 take 4 tablets, day 3 take 3 tablets, day 4 take 2 tablets, day 5 take  1 tablets 08/01/20   Letitia Neri L, PA-C  rosuvastatin (CRESTOR) 10 MG tablet Take 1 tablet (10 mg total) by mouth every evening. For cholesterol. 09/06/19   Pleas Koch, NP  TRULICITY 1.5 DJ/4.9FW SOPN ADMINISTER 1.5 MG UNDER THE SKIN 1 TIME A WEEK 01/31/20   Pleas Koch, NP    Allergies Ibuprofen, Ciprofloxacin, Metformin and related, Penicillins, Sulfa antibiotics, Other, and Shellfish allergy  Family History   Problem Relation Age of Onset  . Asthma Mother   . Diabetes Mother   . Hyperlipidemia Mother   . Hypertension Mother   . Diabetes Father   . Hyperlipidemia Father   . Hypertension Father   . Diabetes Brother   . Depression Brother   . Alcohol abuse Brother   . Depression Maternal Grandmother   . Stroke Paternal Grandmother   . Breast cancer Neg Hx     Social History Social History   Tobacco Use  . Smoking status: Never Smoker  . Smokeless tobacco: Never Used  Vaping Use  . Vaping Use: Never used  Substance Use Topics  . Alcohol use: No  . Drug use: No    Review of Systems  Constitutional: No fever/chills Eyes: No visual changes. ENT: No sore throat. Cardiovascular: Positive chest tightness and chest pain. Respiratory: Positive for nonproductive cough and shortness of breath. Gastrointestinal: No abdominal pain.  No nausea, no vomiting.  No diarrhea.  No constipation. Genitourinary: Negative for dysuria. Musculoskeletal: Negative for back pain. Skin: Negative for rash. Neurological: Negative for headaches, focal weakness or numbness.   ____________________________________________   PHYSICAL EXAM:  VITAL SIGNS: Vitals:   08/03/20 1310 08/03/20 1437  BP: (!) 146/84 (!) 159/97  Pulse: 90 89  Resp: (!) 21 19  Temp:    SpO2: 97% 98%     Constitutional: Alert and oriented.  Obese.  Sitting up in bed, short of breath and mildly tachypneic.  Conversational. Eyes: Conjunctivae are normal. PERRL. EOMI. Head: Atraumatic. Nose: No congestion/rhinnorhea. Mouth/Throat: Mucous membranes are dry.  Oropharynx non-erythematous. Neck: No stridor. No cervical spine tenderness to palpation. Cardiovascular: Tachycardic rate, regular rhythm. Grossly normal heart sounds.  Good peripheral circulation. Respiratory: Tachypneic to the mid 20s, no further evidence of distress.  Moderately poor air movement throughout with diffuse expiratory wheezes.  No focal  features. Gastrointestinal: Soft , nondistended, nontender to palpation. No CVA tenderness. Musculoskeletal: No lower extremity tenderness nor edema.  No joint effusions. No signs of acute trauma. Neurologic:  Normal speech and language. No gross focal neurologic deficits are appreciated. No gait instability noted. Skin:  Skin is warm, dry and intact. No rash noted. Psychiatric: Mood and affect are normal. Speech and behavior are normal.  ____________________________________________   LABS (all labs ordered are listed, but only abnormal results are displayed)  Labs Reviewed  BASIC METABOLIC PANEL - Abnormal; Notable for the following components:      Result Value   Sodium 130 (*)    Chloride 94 (*)    Glucose, Bld 493 (*)    All other components within normal limits  CBC - Abnormal; Notable for the following components:   RBC 5.22 (*)    All other components within normal limits  POC URINE PREG, ED  POC SARS CORONAVIRUS 2 AG -  ED  TROPONIN I (HIGH SENSITIVITY)  TROPONIN I (HIGH SENSITIVITY)   ____________________________________________  12 Lead EKG  Sinus rhythm, rate of 108 bpm.  Normal axis and intervals.  No evidence of acute ischemia.  Sinus tachycardia. ____________________________________________  RADIOLOGY  ED MD interpretation: 2 view CXR reviewed by me without evidence of acute cardiopulmonary pathology.  Official radiology report(s): DG Chest 2 View  Result Date: 08/03/2020 CLINICAL DATA:  Chest pain and shortness of breath EXAM: CHEST - 2 VIEW COMPARISON:  08/01/2020 FINDINGS: The heart size and mediastinal contours are within normal limits. Both lungs are clear. The visualized skeletal structures are unremarkable. IMPRESSION: No active cardiopulmonary disease. Electronically Signed   By: Jerilynn Mages.  Shick M.D.   On: 08/03/2020 08:42    ____________________________________________   PROCEDURES and INTERVENTIONS  Procedure(s) performed (including Critical  Care):  .1-3 Lead EKG Interpretation Performed by: Vladimir Crofts, MD Authorized by: Vladimir Crofts, MD     Interpretation: abnormal     ECG rate:  110   ECG rate assessment: tachycardic     Rhythm: sinus tachycardia     Ectopy: none     Conduction: normal      Medications  sodium chloride 0.9 % bolus 1,000 mL (0 mLs Intravenous Stopped 08/03/20 1437)  ondansetron (ZOFRAN) injection 4 mg (4 mg Intravenous Given 08/03/20 1313)  ipratropium-albuterol (DUONEB) 0.5-2.5 (3) MG/3ML nebulizer solution 3 mL (3 mLs Nebulization Given 08/03/20 1314)  methylPREDNISolone sodium succinate (SOLU-MEDROL) 125 mg/2 mL injection 125 mg (125 mg Intravenous Given 08/03/20 1433)  ipratropium-albuterol (DUONEB) 0.5-2.5 (3) MG/3ML nebulizer solution 6 mL (6 mLs Nebulization Given 08/03/20 1433)    ____________________________________________   MDM / ED COURSE   46-year-old woman with history of diabetes and intermittent asthma presents to ED with evidence of asthma exacerbation necessitating medical admission.  Patient tachycardic, primarily after albuterol, otherwise hemodynamically stable on room air.  Exam with tachypnea, wheezing and poor air movement throughout lung fields.  No evidence of acute pathology beyond her respiratory illness.  Blood work with hyperglycemia without acidosis, causing pseudohyponatremia.  CXR without infiltrate to necessitate antibiotics.  Patient reports only minimal improvement of symptoms after multiple duo nebs and steroids here in the ED, persistently tachycardic and wheezing.  Due to her uncontrolled symptoms, we will admit for further work-up and management  Clinical Course as of 08/03/20 1542  Thu Aug 03, 2020  1420 Reassessed.  Patient tearful and reports that she still feels tightness in her chest.  Reexamination of her lungs reveals continued wheezing, though improved air movement.  She dictates that she has not had her steroids at today.  We will provide additional  DuoNeb and steroids. [DS]  9574 Reassessed.  Still wheezing, tearful, tachycardic.  We will admit for asthma exacerbation. [DS]    Clinical Course User Index [DS] Vladimir Crofts, MD    ____________________________________________   FINAL CLINICAL IMPRESSION(S) / ED DIAGNOSES  Final diagnoses:  Mild intermittent asthma with exacerbation  Hyperglycemia     ED Discharge Orders    None       Charie Pinkus Tamala Julian   Note:  This document was prepared using Dragon voice recognition software and may include unintentional dictation errors.   Vladimir Crofts, MD 08/03/20 [phone]

## 2020-08-03 NOTE — H&P (Addendum)
History and Physical    Joan [name] SWH:[id number] DOB: 02-02-1981 DOA: 08/03/2020  PCP: Pleas Koch, NP  Patient coming from: home  I have personally briefly reviewed patient's old medical records in [place]  Chief Complaint:   HPI: Joan [name] is a 39 y.o. female with medical history significant of  Moderate persistent ashtma,noncompliance with medication due to financial issues, COVID 19 infection 2021 with recovery,DMII uncontrolled, obesity,HLD, HTN, major depression, PTSD,who presents to ed for the second time in 2-3 days with increase sob and chest tightness. Patient initially seen on 08/01/20 diagnosed with asthma exacerbation and was discharged on  Prednisone and cough medicine. However patient states despite taking medications prescribed she continued to have worsening symptoms. She presents for re-evaluation. She notes no associated fever, n/v/d/dysuria .She does note mild palpitations as well as episode light headedness, in addition to chronic right lower quad pain she states she has had since her hysterectomy.  ED Course:  Vitals: 97.7, bp 108, 79, rr 26, hr, 109, sat 97% on ra  Labs: Wbc:6, hgb 14.6, plt 275,  Na 130 close to baseline 134, K 4.1, cl94, glucose 493 Cr 0.63 CE 5,5 Cxr:NAD Respiratory panel pending  YK:ZLDJTT,SVXBLT,[id number], Review of Systems: As per HPI otherwise 10 point review of systems negative.   Past Medical History:  Diagnosis Date  . Acute asthma exacerbation 04/10/2018  . Acute non-recurrent maxillary sinusitis 09/25/2018  . Asthma   . Auditory hallucinations    only after anesthesia  . BV (bacterial vaginosis) 02/13/2018  . Diabetes mellitus without complication (HCC)    diet controlled  . Diabetes mellitus, type II (HCC)    insulin, jardiance  . Dyspnea    with exertion  . Essential hypertension   . Essential hypertension 11/24/2017  . GERD (gastroesophageal reflux disease)    occasionally-NO MEDS  . History of  placement of ear tubes 05/20/2018  . Hyperlipidemia   . MDD (major depressive disorder)   . Miscarriage   . Nausea & vomiting 10/05/2019  . PTSD (post-traumatic stress disorder)   . Right lower quadrant abdominal pain 02/13/2018  . Tachycardia     Past Surgical History:  Procedure Laterality Date  . ABDOMINAL HYSTERECTOMY    . ADENOIDECTOMY    . CHOLECYSTECTOMY  2009  . CYSTOSCOPY N/A 07/31/2018   Procedure: CYSTOSCOPY;  Surgeon: Benjaman Kindler, MD;  Location: [facility] ORS;  Service: Gynecology;  Laterality: N/A;  . LAPAROSCOPIC BILATERAL SALPINGECTOMY Bilateral 07/31/2018   Procedure: LAPAROSCOPIC BILATERAL SALPINGECTOMY;  Surgeon: Benjaman Kindler, MD;  Location: [facility] ORS;  Service: Gynecology;  Laterality: Bilateral;  . LAPAROSCOPIC HYSTERECTOMY N/A 07/31/2018   Procedure: HYSTERECTOMY TOTAL LAPAROSCOPIC;  Surgeon: Benjaman Kindler, MD;  Location: [facility] OR[facility];  Service: Gynecology;  Laterality: N/A;  . LAPAROSCOPY N/A 06/07/2019   Procedure: LAPAROSCOPY OPERATIVE, WITH PERITONEAL BIOPSIES;  Surgeon: Benjaman Kindler, MD;  Location: [facility] OR[facility];  Service: Gynecology;  Laterality: N/A;  . LYSIS OF ADHESION N/A 07/31/2018   Procedure: LYSIS OF ADHESION;  Surgeon: Benjaman Kindler, MD;  Location: [facility] ORS;  Service: Gynecology;  Laterality: N/A;  . OVARY SURGERY Right    cyst removed a while ago  . TONSILLECTOMY    . tubes in ear       reports that she has never smoked. She has never used smokeless tobacco. She reports that she does not drink alcohol and does not use drugs.  Allergies  Allergen Reactions  . Ibuprofen Swelling    Facial   . Ciprofloxacin Hives  and Rash  . Metformin And Related Other (See Comments)    Elevated Lactic Acid  . Penicillins Hives and Rash    Has patient had a PCN reaction causing immediate rash, facial/tongue/throat swelling, SOB or lightheadedness with hypotension: No Has patient had a PCN reaction causing severe rash involving mucus membranes or skin  necrosis: No Has patient had a PCN reaction that required hospitalization: No Has patient had a PCN reaction occurring within the last 10 years: No If all of the above answers are "NO", then may proceed with Cephalosporin use. THE PATIENT IS ABLE TO TOLERATE CEPHALOSPORINS WITHOUT DIFFIC  . Sulfa Antibiotics Hives and Rash  . Other     ALL NUTS-SCRATCHY THROAT  . Shellfish Allergy Other (See Comments)    ALL SEAFOOD-SCRATCHY THROAT    Family History  Problem Relation Age of Onset  . Asthma Mother   . Diabetes Mother   . Hyperlipidemia Mother   . Hypertension Mother   . Diabetes Father   . Hyperlipidemia Father   . Hypertension Father   . Diabetes Brother   . Depression Brother   . Alcohol abuse Brother   . Depression Maternal Grandmother   . Stroke Paternal Grandmother   . Breast cancer Neg Hx     Prior to Admission medications   Medication Sig Start Date End Date Taking? Authorizing Provider  albuterol (VENTOLIN HFA) 108 (90 Base) MCG/ACT inhaler INHALE 2 PUFFS BY MOUTH EVERY 6 HOURS AS NEEDED FOR SHORTNESS OF BREATH AND WHEEZING Patient taking differently: Inhale 2 puffs into the lungs every 6 (six) hours as needed for wheezing or shortness of breath. INHALE 2 PUFFS BY MOUTH EVERY 6 HOURS AS NEEDED FOR SHORTNESS OF BREATH AND WHEEZING 10/19/18   Pleas Koch, NP  blood glucose meter kit and supplies KIT Dispense based on patient and insurance preference. Use up three times daily as directed. (FOR ICD-9 250.00, 250.01). 11/24/17   Pleas Koch, NP  brompheniramine-pseudoephedrine-DM 30-2-10 MG/5ML syrup Take 5 mLs by mouth 4 (four) times daily as needed. 08/01/20   Johnn Hai, PA-C  citalopram (CELEXA) 20 MG tablet Take 1 tablet (20 mg total) by mouth daily. For anxiety. 11/09/19   Pleas Koch, NP  insulin glargine (LANTUS SOLOSTAR) 100 UNIT/ML Solostar Pen Inject 50 Units into the skin daily. 02/11/20   Pleas Koch, NP  insulin lispro (HUMALOG  KWIKPEN) 100 UNIT/ML KwikPen Inject 0.12 mLs (12 Units total) into the skin 3 (three) times daily. 02/08/20   Pleas Koch, NP  Insulin Pen Needle (PEN NEEDLES) 31G X 6 MM MISC Use with insulin as directed. 02/02/20   Pleas Koch, NP  losartan (COZAAR) 25 MG tablet Take 1 tablet (25 mg total) by mouth daily. For blood pressure. 02/08/20   Pleas Koch, NP  montelukast (SINGULAIR) 10 MG tablet Take 10 mg by mouth at bedtime.     [provider]  predniSONE (DELTASONE) 10 MG tablet Take 5 tablets  tomorrow, on day 2 take 4 tablets, day 3 take 3 tablets, day 4 take 2 tablets, day 5 take  1 tablets 08/01/20   Letitia Neri L, PA-C  rosuvastatin (CRESTOR) 10 MG tablet Take 1 tablet (10 mg total) by mouth every evening. For cholesterol. 09/06/19   Pleas Koch, NP  TRULICITY 1.5 YB/3.3OV SOPN ADMINISTER 1.5 MG UNDER THE SKIN 1 TIME A WEEK 01/31/20   Pleas Koch, NP    Physical Exam: Vitals:   08/03/20 1114  08/03/20 1310 08/03/20 1437 08/03/20 1611  BP: (!) 151/94 (!) 146/84 (!) 159/97 131/70  Pulse: 96 90 89 (!) 101  Resp: (!) 24 (!) _0 Temp:    98.5 F (36.9 C)  TempSrc:    Oral  SpO2: 97% 97% 98% 97%  Weight:      Height:        Constitutional: NAD, calm, comfortable Vitals:   08/03/20 1114 08/03/20 1310 08/03/20 1437 08/03/20 1611  BP: (!) 151/94 (!) 146/84 (!) 159/97 131/70  Pulse: 96 90 89 (!) 101  Resp: (!) 24 (!) _1 Temp:    98.5 F (36.9 C)  TempSrc:    Oral  SpO2: 97% 97% 98% 97%  Weight:      Height:       Eyes: PERRL, lids and conjunctivae normal ENMT: Mucous membranes are moist. Posterior pharynx clear of any exudate or lesions.Normal dentition.  Neck: normal, supple, no masses, no thyromegaly Respiratory: slightly diminished, no wheezing currently, no crackles. Normal respiratory effort. No accessory muscle use.  Cardiovascular: Regular rate and rhythm, no murmurs / rubs / gallops. No extremity edema. 2+ pedal pulses. No  carotid bruits.  Abdomen: no tenderness, no masses palpated. No hepatosplenomegaly. Bowel sounds positive.  Musculoskeletal: no clubbing / cyanosis. No joint deformity upper and lower extremities. Good ROM, no contractures. Normal muscle tone.  Skin: warm and dry  Neurologic: CN 2-12 grossly intact. Sensation intact, DTR normal. Strength 5/5 in all 4.  Psychiatric: Normal judgment and insight. Alert and oriented x 3. Normal mood.    Labs on Admission: I have personally reviewed following labs and imaging studies  CBC: Recent Labs  Lab 08/03/20 0819  WBC 6.0  HGB 14.6  HCT 43.7  MCV 83.7  PLT 875   Basic Metabolic Panel: Recent Labs  Lab 08/03/20 0819  NA 130*  K 4.1  CL 94*  CO2 27  GLUCOSE 493*  BUN 10  CREATININE 0.63  CALCIUM 9.5   GFR: Estimated Creatinine Clearance: 132.8 mL/min (by C-G formula based on SCr of 0.63 mg/dL). Liver Function Tests: No results for input(s): AST, ALT, ALKPHOS, BILITOT, PROT, ALBUMIN in the last 168 hours. No results for input(s): LIPASE, AMYLASE in the last 168 hours. No results for input(s): AMMONIA in the last 168 hours. Coagulation Profile: No results for input(s): INR, PROTIME in the last 168 hours. Cardiac Enzymes: No results for input(s): CKTOTAL, CKMB, CKMBINDEX, TROPONINI in the last 168 hours. BNP (last 3 results) No results for input(s): PROBNP in the last 8760 hours. HbA1C: No results for input(s): HGBA1C in the last 72 hours. CBG: No results for input(s): GLUCAP in the last 168 hours. Lipid Profile: No results for input(s): CHOL, HDL, LDLCALC, TRIG, CHOLHDL, LDLDIRECT in the last 72 hours. Thyroid Function Tests: No results for input(s): TSH, T4TOTAL, FREET4, T3FREE, THYROIDAB in the last 72 hours. Anemia Panel: No results for input(s): VITAMINB12, FOLATE, FERRITIN, TIBC, IRON, RETICCTPCT in the last 72 hours. Urine analysis:    Component Value Date/Time   COLORURINE YELLOW (A) 03/16/2020 1327   APPEARANCEUR CLEAR  (A) 03/16/2020 1327   LABSPEC 1.033 (H) 03/16/2020 1327   PHURINE 6.0 03/16/2020 1327   GLUCOSEU >=500 (A) 03/16/2020 1327   HGBUR NEGATIVE 03/16/2020 1327   BILIRUBINUR NEGATIVE 03/16/2020 1327   BILIRUBINUR negative 03/16/2020 1050   BILIRUBINUR neg 08/20/2019 2029   KETONESUR NEGATIVE 03/16/2020 1327   KETONESUR negative 03/16/2020 [place] 03/16/2020 1327  PROTEINUR negative 03/16/2020 1050   UROBILINOGEN 0.2 03/16/2020 1050   UROBILINOGEN 0.2 05/07/2015 1500   NITRITE NEGATIVE 03/16/2020 1327   NITRITE Negative 03/16/2020 1050   LEUKOCYTESUR NEGATIVE 03/16/2020 1327   LEUKOCYTESUR Negative 03/16/2020 1050    Radiological Exams on Admission: DG Chest 2 View  Result Date: 08/03/2020 CLINICAL DATA:  Chest pain and shortness of breath EXAM: CHEST - 2 VIEW COMPARISON:  08/01/2020 FINDINGS: The heart size and mediastinal contours are within normal limits. Both lungs are clear. The visualized skeletal structures are unremarkable. IMPRESSION: No active cardiopulmonary disease. Electronically Signed   By: Jerilynn Mages.  Shick M.D.   On: 08/03/2020 08:42    EKG: Independently reviewed.   Assessment/Plan Acute Asthma exacerbation without hypoxemia  -admit to tele , obs -steroid iv bid , taper as able  -nebs standing and prn  -peak flow per protocol  -continue singular -not on controller medication due to inability to afford medications  Uncontrolled DMII -in back ground of steroid use and noncompliance with medication due to ability to afford medications - place on hyperglycemic protocol  -restart 50 units lantus and meal coverage 12 units with each meal  HLD -continue crestor -check lipid panel  HTN -uncontrolled on admit  -related to steroid use and noncompliance  --prn htn med and monitor trend  -resume BP medications once med rec completed   Hx of  COVID 19 infection 2021 - with recovery  Obesity -referral to nutrition as outpatient    major depression/  PTSD  Social : will need social work consult on discharge for assistance with medication  DVT prophylaxis: lovenox  Code Status: Full Family Communication:  N/a  Disposition Plan:patient  expected to be admitted less than 2 midnights Consults called: n/a Admission status: inpatient   Clance Boll MD [facility]  If 7PM-7AM, please contact night-coverage [email or web address] Password [email or web address]  08/03/2020, 5:05 PM

## 2020-08-03 NOTE — ED Notes (Signed)
Patient assisted to the bathroom at this time.  Patient removed from monitors, no complaints with walking. Patient appeared to be out of breath upon returning back to room.

## 2020-08-03 NOTE — ED Triage Notes (Signed)
Pt to ED via POV, reports chest tightness 9/10, reports seen Monday for same pt states "this is the worst it's ever been". Pt states has been using neb and inhaler q 4 hrs without relief. Pt with noted SOB, ambulatory to triage room.

## 2020-08-04 LAB — COMPREHENSIVE METABOLIC PANEL
ALT: 31 U/L (ref 0–44)
AST: 24 U/L (ref 15–41)
Albumin: 3.9 g/dL (ref 3.5–5.0)
Alkaline Phosphatase: 104 U/L (ref 38–126)
Anion gap: 8 (ref 5–15)
BUN: 16 mg/dL (ref 6–20)
CO2: 27 mmol/L (ref 22–32)
Calcium: 9.5 mg/dL (ref 8.9–10.3)
Chloride: 97 mmol/L — ABNORMAL LOW (ref 98–111)
Creatinine, Ser: 0.67 mg/dL (ref 0.44–1.00)
GFR, Estimated: >60 mL/min (ref >60)
Glucose, Bld: 385 mg/dL — ABNORMAL HIGH (ref 70–99)
Potassium: 4.8 mmol/L (ref 3.5–5.1)
Sodium: 132 mmol/L — ABNORMAL LOW (ref 135–145)
Total Bilirubin: 0.8 mg/dL (ref 0.3–1.2)
Total Protein: 7.4 g/dL (ref 6.5–8.1)

## 2020-08-04 LAB — CBC
HCT: 42.7 % (ref 36.0–46.0)
Hemoglobin: 14.5 g/dL (ref 12.0–15.0)
MCH: 27.8 pg (ref 26.0–34.0)
MCHC: 34 g/dL (ref 30.0–36.0)
MCV: 82 fL (ref 80.0–100.0)
Platelets: 315 10*3/uL (ref 150–400)
RBC: 5.21 MIL/uL — ABNORMAL HIGH (ref 3.87–5.11)
RDW: 12.5 % (ref 11.5–15.5)
WBC: 9.3 10*3/uL (ref 4.0–10.5)
nRBC: 0 % (ref 0.0–0.2)

## 2020-08-04 LAB — HEMOGLOBIN A1C
Hgb A1c MFr Bld: 13.1 % — ABNORMAL HIGH (ref 4.8–5.6)
Mean Plasma Glucose: 329.27 mg/dL

## 2020-08-04 LAB — GLUCOSE, CAPILLARY
Glucose-Capillary: 303 mg/dL — ABNORMAL HIGH (ref 70–99)
Glucose-Capillary: 367 mg/dL — ABNORMAL HIGH (ref 70–99)
Glucose-Capillary: 357 mg/dL — ABNORMAL HIGH (ref 70–99)

## 2020-08-04 LAB — CBG MONITORING, ED
Glucose-Capillary: 286 mg/dL — ABNORMAL HIGH (ref 70–99)
Glucose-Capillary: 357 mg/dL — ABNORMAL HIGH (ref 70–99)
Glucose-Capillary: 550 mg/dL (ref 70–99)

## 2020-08-04 MED ORDER — INSULIN ASPART PROT & ASPART (70-30 MIX) 100 UNIT/ML ~~LOC~~ SUSP
28.0000 [IU] | Freq: Two times a day (BID) | SUBCUTANEOUS | Status: DC
  Administered 2020-08-04: 28 [IU] via SUBCUTANEOUS
  Filled 2020-08-04: qty 10

## 2020-08-04 MED ORDER — METHYLPREDNISOLONE SODIUM SUCC 40 MG IJ SOLR
40.0000 mg | Freq: Two times a day (BID) | INTRAMUSCULAR | Status: DC
  Administered 2020-08-04 – 2020-08-05 (×3): 40 mg via INTRAVENOUS
  Filled 2020-08-04 (×3): qty 1

## 2020-08-04 MED ORDER — INSULIN ASPART 100 UNIT/ML ~~LOC~~ SOLN
15.0000 [IU] | Freq: Once | SUBCUTANEOUS | Status: AC
  Administered 2020-08-04: 15 [IU] via SUBCUTANEOUS
  Filled 2020-08-04: qty 1

## 2020-08-04 MED ORDER — INSULIN ASPART 100 UNIT/ML ~~LOC~~ SOLN
0.0000 [IU] | Freq: Three times a day (TID) | SUBCUTANEOUS | Status: DC
  Administered 2020-08-04: 20 [IU] via SUBCUTANEOUS
  Administered 2020-08-04: 13:00:00 15 [IU] via SUBCUTANEOUS
  Administered 2020-08-04: 20 [IU] via SUBCUTANEOUS
  Administered 2020-08-04: 11 [IU] via SUBCUTANEOUS
  Administered 2020-08-05 (×2): 15 [IU] via SUBCUTANEOUS
  Filled 2020-08-04 (×6): qty 1

## 2020-08-04 NOTE — Progress Notes (Signed)
PROGRESS NOTE    Joan [name]  WEX:[id number] DOB: 1981/02/22 DOA: 08/03/2020 PCP: Doreene Nest, NP   Chief Complaint  Patient presents with  . Chest Pain  . Shortness of Breath   Brief Narrative: 38 year old female with history of moderate persistent asthma, noncompliance with medication due to financial issues, history of COVID-19 infection in 2021 and with recovery, uncontrolled diabetes mellitus on insulin, hyperlipidemia, morbid obesity with BMI 48, hypertension, major depression/PTSD presented to the ED with shortness of breath, chest tightness.  This is her second time visit in 2 to 3 days to the ED initially seen on 07/24/2020 treated for asthma exacerbation and discharged on prednisone and cough medicine but symptoms uncontrolled with those medication and patient was seen again in the ED.  In the ED work-up showed Tachycardia, tachypnea.  Lab with hyponatremia uncontrolled hyperglycemia 493, stable renal function, COVID-19 negative.  Chest x-ray 12/30 showed no active cardiopulmonary disease. Patient was admitted for acute asthma exacerbation  Subjective:  Still FEELS SOME EBTTER, NOT ON O2, SUGAR RUNNING SEVERELY HIGH Although not needing oxygen but appears short of breath and has wheezing  Assessment & Plan:  Acute exacerbation of moderate persistent asthma: Still symptomatic with wheezing on expiration on auscultation.  Not needing oxygen.  Continue on IV steroids bronchodilators and monitor blood sugar closely.  uncontrolled diabetes mellitus on insulin, hemoglobin A1c 13.1, blood sugar remains poorly controlled especially in the setting of steroid.  Also with noncompliance, placed back on 15 his Lantus, meal coverage until units premeal. Recent Labs  Lab 08/04/20 0056 08/04/20 0439  GLUCAP 550* 357*   Hyperlipidemia: Continue Crestor.  Lipid panel stable.  Morbid obesity with BMI 48: Benefit with weight loss and healthy lifestyle and outpatient PCP  follow-up  Hypertension uncontrolled on admission.  From steroid and noncompliance.  Major depression/PTSD: On Celexa.  Mood is stable.  Noncompliance with medication due to financial issues: TOC consult try to switch her to [facility] brand for affordability.  Patient switched her job and has not had insurance  for 3 months or so.  history of COVID-19 infection in 2021 and with recovery  Nutrition: Diet Order            Diet heart healthy/carb modified Room service appropriate? Yes; Fluid consistency: Thin  Diet effective now                 Body mass index is 48.43 kg/m.  DVT prophylaxis: lovenox Code Status:   Code Status: Full Code Family Communication: plan of care discussed with patient at bedside.  Status is: Observation Patient remains short of breath and on iv steroid, has multiple issues along with uncontrolled hyperglycemia unable to afford Lantus/Levemir and we are going to transition her to insulin 70/30 inpatient given severity of hyperglycemia.Patient will remain hospitalized at least 2 midnight.  Dispo: The patient is from: Home              Anticipated d/c is to: Home              Anticipated d/c date is: 1 day              Patient currently is not medically stable to d/c.  Consultants:see note  Procedures:see note  Culture/Microbiology    Component Value Date/Time   SDES THROAT 02/02/2020 1237   SPECREQUEST NONE 02/02/2020 1237   CULT  02/02/2020 1237    NO GROUP A STREP (S.PYOGENES) ISOLATED Performed at [facility], [address] [place], [place] [address]    REPTSTATUS 02/04/2020 FINAL 02/02/2020 1237    Other culture-see note  Medications: Scheduled Meds: . citalopram  20 mg Oral Daily  . enoxaparin (LOVENOX) injection  0.5 mg/kg Subcutaneous Q24H  . guaiFENesin  600 mg Oral BID  . insulin aspart  0-20 Units Subcutaneous TID AC & HS  . insulin aspart  12 Units Subcutaneous TID  . insulin glargine  50 Units Subcutaneous Daily  .  ipratropium-albuterol  3 mL Nebulization Q4H  . losartan  25 mg Oral Daily  . methylPREDNISolone (SOLU-MEDROL) injection  60 mg Intravenous Q12H  . montelukast  10 mg Oral QHS  . rosuvastatin  10 mg Oral QPM  . sodium chloride flush  3 mL Intravenous Q12H   Continuous Infusions:  Antimicrobials: Anti-infectives (From admission, onward)   None     Objective: Vitals: Today's Vitals   08/04/20 0500 08/04/20 0530 08/04/20 0600 08/04/20 0630  BP: 115/74 135/75 118/63 109/66  Pulse: 72 90 73 65  Resp: 17 20 18 18   Temp:      TempSrc:      SpO2: 97% 96% 95% 97%  Weight:      Height:      PainSc:       No intake or output data in the 24 hours ending 08/04/20 0748 Filed Weights   08/03/20 0806  Weight: (!) 136.1 kg   Weight change:   Intake/Output from previous day: No intake/output data recorded. Intake/Output this shift: No intake/output data recorded.  Examination: General exam: AAOX3, OBESE ,NAD, weak appearing. HEENT:Oral mucosa moist, Ear/Nose WNL grossly,dentition normal. Respiratory system: bilaterally AIR ENTRY+, Some expiratory wheezing at end,no use of accessory muscle, non tender. Cardiovascular system: S1 & S2 +, regular, No JVD. Gastrointestinal system: Abdomen soft, NT,ND, BS+. Nervous System:Alert, awake, moving extremities and grossly nonfocal Extremities: No edema, distal peripheral pulses palpable.  Skin: No rashes,no icterus. MSK: Normal muscle bulk,tone, power  Data Reviewed: I have personally reviewed following labs and imaging studies CBC: Recent Labs  Lab 08/03/20 0819 08/03/20 1807 08/04/20 0450  WBC 6.0 7.6 9.3  HGB 14.6 14.2 14.5  HCT 43.7 42.3 42.7  MCV 83.7 84.3 82.0  PLT 275 266 315   Basic Metabolic Panel: Recent Labs  Lab 08/03/20 0819 08/03/20 1807 08/04/20 0450  NA 130*  --  132*  K 4.1  --  4.8  CL 94*  --  97*  CO2 27  --  27  GLUCOSE 493*  --  385*  BUN 10  --  16  CREATININE 0.63 0.90 0.67  CALCIUM 9.5  --  9.5   MG  --  1.9  --    GFR: Estimated Creatinine Clearance: 132.8 mL/min (by C-G formula based on SCr of 0.67 mg/dL). Liver Function Tests: Recent Labs  Lab 08/04/20 0450  AST 24  ALT 31  ALKPHOS 104  BILITOT 0.8  PROT 7.4  ALBUMIN 3.9   No results for input(s): LIPASE, AMYLASE in the last 168 hours. No results for input(s): AMMONIA in the last 168 hours. Coagulation Profile: No results for input(s): INR, PROTIME in the last 168 hours. Cardiac Enzymes: No results for input(s): CKTOTAL, CKMB, CKMBINDEX, TROPONINI in the last 168 hours. BNP (last 3 results) No results for input(s): PROBNP in the last 8760 hours. HbA1C: Recent Labs    08/03/20 1807  HGBA1C 13.1*   CBG: Recent Labs  Lab 08/04/20 0056 08/04/20 0439  GLUCAP 550* 357*   Lipid Profile:  No results for input(s): CHOL, HDL, LDLCALC, TRIG, CHOLHDL, LDLDIRECT in the last 72 hours. Thyroid Function Tests: Recent Labs    08/03/20 1807  TSH 1.211   Anemia Panel: No results for input(s): VITAMINB12, FOLATE, FERRITIN, TIBC, IRON, RETICCTPCT in the last 72 hours. Sepsis Labs: No results for input(s): PROCALCITON, LATICACIDVEN in the last 168 hours.  Recent Results (from the past 240 hour(s))  Resp Panel by RT-PCR (Flu A&B, Covid) Nasopharyngeal Swab     Status: None   Collection Time: 07/26/20  4:06 PM   Specimen: Nasopharyngeal Swab; Nasopharyngeal(NP) swabs in vial transport medium  Result Value Ref Range Status   SARS Coronavirus 2 by RT PCR NEGATIVE NEGATIVE Final    Comment: (NOTE) SARS-CoV-2 target nucleic acids are NOT DETECTED.  The SARS-CoV-2 RNA is generally detectable in upper respiratory specimens during the acute phase of infection. The lowest concentration of SARS-CoV-2 viral copies this assay can detect is 138 copies/mL. A negative result does not preclude SARS-Cov-2 infection and should not be used as the sole basis for treatment or other patient management decisions. A negative result may  occur with  improper specimen collection/handling, submission of specimen other than nasopharyngeal swab, presence of viral mutation(s) within the areas targeted by this assay, and inadequate number of viral copies(<138 copies/mL). A negative result must be combined with clinical observations, patient history, and epidemiological information. The expected result is Negative.  Fact Sheet for Patients:  BloggerCourse.com  Fact Sheet for Healthcare Providers:  SeriousBroker.it  This test is no t yet approved or cleared by the [place] FDA and  has been authorized for detection and/or diagnosis of SARS-CoV-2 by FDA under an Emergency Use Authorization (EUA). This EUA will remain  in effect (meaning this test can be used) for the duration of the COVID-19 declaration under Section 564(b)(1) of the Act, 21 U.S.C.section 360bbb-3(b)(1), unless the authorization is terminated  or revoked sooner.       Influenza A by PCR NEGATIVE NEGATIVE Final   Influenza B by PCR NEGATIVE NEGATIVE Final    Comment: (NOTE) The Xpert Xpress SARS-CoV-2/FLU/RSV plus assay is intended as an aid in the diagnosis of influenza from Nasopharyngeal swab specimens and should not be used as a sole basis for treatment. Nasal washings and aspirates are unacceptable for Xpert Xpress SARS-CoV-2/FLU/RSV testing.  Fact Sheet for Patients: BloggerCourse.com  Fact Sheet for Healthcare Providers: SeriousBroker.it  This test is not yet approved or cleared by the [place] FDA and has been authorized for detection and/or diagnosis of SARS-CoV-2 by FDA under an Emergency Use Authorization (EUA). This EUA will remain in effect (meaning this test can be used) for the duration of the COVID-19 declaration under Section 564(b)(1) of the Act, 21 U.S.C. section 360bbb-3(b)(1), unless the authorization is terminated  or revoked.  Performed at [facility], [address] [place], [place] [address]   Resp Panel by RT-PCR (Flu A&B, Covid) Nasopharyngeal Swab     Status: None   Collection Time: 08/01/20 10:12 AM   Specimen: Nasopharyngeal Swab; Nasopharyngeal(NP) swabs in vial transport medium  Result Value Ref Range Status   SARS Coronavirus 2 by RT PCR NEGATIVE NEGATIVE Final    Comment: (NOTE) SARS-CoV-2 target nucleic acids are NOT DETECTED.  The SARS-CoV-2 RNA is generally detectable in upper respiratory specimens during the acute phase of infection. The lowest concentration of SARS-CoV-2 viral copies this assay can detect is 138 copies/mL. A negative result does not preclude SARS-Cov-2 infection and should not  be used as the sole basis for treatment or other patient management decisions. A negative result may occur with  improper specimen collection/handling, submission of specimen other than nasopharyngeal swab, presence of viral mutation(s) within the areas targeted by this assay, and inadequate number of viral copies(<138 copies/mL). A negative result must be combined with clinical observations, patient history, and epidemiological information. The expected result is Negative.  Fact Sheet for Patients:  BloggerCourse.com  Fact Sheet for Healthcare Providers:  SeriousBroker.it  This test is no t yet approved or cleared by the [place] FDA and  has been authorized for detection and/or diagnosis of SARS-CoV-2 by FDA under an Emergency Use Authorization (EUA). This EUA will remain  in effect (meaning this test can be used) for the duration of the COVID-19 declaration under Section 564(b)(1) of the Act, 21 U.S.C.section 360bbb-3(b)(1), unless the authorization is terminated  or revoked sooner.       Influenza A by PCR NEGATIVE NEGATIVE Final   Influenza B by PCR NEGATIVE NEGATIVE Final    Comment: (NOTE) The Xpert  Xpress SARS-CoV-2/FLU/RSV plus assay is intended as an aid in the diagnosis of influenza from Nasopharyngeal swab specimens and should not be used as a sole basis for treatment. Nasal washings and aspirates are unacceptable for Xpert Xpress SARS-CoV-2/FLU/RSV testing.  Fact Sheet for Patients: BloggerCourse.com  Fact Sheet for Healthcare Providers: SeriousBroker.it  This test is not yet approved or cleared by the [place] FDA and has been authorized for detection and/or diagnosis of SARS-CoV-2 by FDA under an Emergency Use Authorization (EUA). This EUA will remain in effect (meaning this test can be used) for the duration of the COVID-19 declaration under Section 564(b)(1) of the Act, 21 U.S.C. section 360bbb-3(b)(1), unless the authorization is terminated or revoked.  Performed at [facility], [address] [place], [place] [address]   Resp Panel by RT-PCR (Flu A&B, Covid) Nasopharyngeal Swab     Status: None   Collection Time: 08/03/20 10:17 PM   Specimen: Nasopharyngeal Swab; Nasopharyngeal(NP) swabs in vial transport medium  Result Value Ref Range Status   SARS Coronavirus 2 by RT PCR NEGATIVE NEGATIVE Final    Comment: (NOTE) SARS-CoV-2 target nucleic acids are NOT DETECTED.  The SARS-CoV-2 RNA is generally detectable in upper respiratory specimens during the acute phase of infection. The lowest concentration of SARS-CoV-2 viral copies this assay can detect is 138 copies/mL. A negative result does not preclude SARS-Cov-2 infection and should not be used as the sole basis for treatment or other patient management decisions. A negative result may occur with  improper specimen collection/handling, submission of specimen other than nasopharyngeal swab, presence of viral mutation(s) within the areas targeted by this assay, and inadequate number of viral copies(<138 copies/mL). A negative result must be combined  with clinical observations, patient history, and epidemiological information. The expected result is Negative.  Fact Sheet for Patients:  BloggerCourse.com  Fact Sheet for Healthcare Providers:  SeriousBroker.it  This test is no t yet approved or cleared by the [place] FDA and  has been authorized for detection and/or diagnosis of SARS-CoV-2 by FDA under an Emergency Use Authorization (EUA). This EUA will remain  in effect (meaning this test can be used) for the duration of the COVID-19 declaration under Section 564(b)(1) of the Act, 21 U.S.C.section 360bbb-3(b)(1), unless the authorization is terminated  or revoked sooner.       Influenza A by PCR NEGATIVE NEGATIVE Final   Influenza B by PCR NEGATIVE NEGATIVE  Final    Comment: (NOTE) The Xpert Xpress SARS-CoV-2/FLU/RSV plus assay is intended as an aid in the diagnosis of influenza from Nasopharyngeal swab specimens and should not be used as a sole basis for treatment. Nasal washings and aspirates are unacceptable for Xpert Xpress SARS-CoV-2/FLU/RSV testing.  Fact Sheet for Patients: BloggerCourse.com  Fact Sheet for Healthcare Providers: SeriousBroker.it  This test is not yet approved or cleared by the [place] FDA and has been authorized for detection and/or diagnosis of SARS-CoV-2 by FDA under an Emergency Use Authorization (EUA). This EUA will remain in effect (meaning this test can be used) for the duration of the COVID-19 declaration under Section 564(b)(1) of the Act, 21 U.S.C. section 360bbb-3(b)(1), unless the authorization is terminated or revoked.  Performed at [facility], [address] [place], [place] [address]      Radiology Studies: DG Chest 2 View  Result Date: 08/03/2020 CLINICAL DATA:  Chest pain and shortness of breath EXAM: CHEST - 2 VIEW COMPARISON:  08/01/2020 FINDINGS:  The heart size and mediastinal contours are within normal limits. Both lungs are clear. The visualized skeletal structures are unremarkable. IMPRESSION: No active cardiopulmonary disease. Electronically Signed   By: Judie Petit.  Shick M.D.   On: 08/03/2020 08:42     LOS: 0 days   Lanae Boast, MD [facility]  08/04/2020, 7:48 AM

## 2020-08-04 NOTE — Progress Notes (Addendum)
TOC consult due to patient being uninsured. CSW spoke with patient. Patient reported she has been using [facility] for PCP and [facility] for medications, but is interested in resources for patients without insurance. CSW explained [facility]/Medication Management option. Patient interested in this. Provided application and explained process to patient. Updated pharmacy in Epic. Sent referral to [facility] at [facility].  Alfonso Ramus, [place] [phone]

## 2020-08-04 NOTE — Progress Notes (Addendum)
Inpatient Diabetes Program Recommendations  AACE/ADA: New Consensus Statement on Inpatient Glycemic Control (2015)  Target Ranges:  Prepandial:   less than 140 mg/dL      Peak postprandial:   less than 180 mg/dL (1-2 hours)      Critically ill patients:  140 - 180 mg/dL   Results for Joan [name], Joan [name] (MRN [id number]) as of 08/04/2020 07:07  Ref. Range 08/04/2020 00:56 08/04/2020 04:39  Glucose-Capillary Latest Ref Range: 70 - 99 mg/dL 550 (HH)  12 units NOVOLOG  357 (H)  15 units NOVOLOG    Results for Joan, [name] (MRN [id number]) as of 08/04/2020 07:07  Ref. Range 02/08/2020 08:36 08/03/2020 18:07  Hemoglobin A1C Latest Ref Range: 4.8 - 5.6 % 13.1 (A) 13.1 (H)  (329 mg/dl)    To ED with Asthma Flare  History: DM   Home DM Meds: Lantus 50 units QHS        Humalog 12 units TID   Current Orders: Lantus 50 units Daily       Novolog 0-20 units ac/hs       Novolog 12 units TID with meals    PCP: Alma Friendly with [facility]  Got 125 mg Solumedrol X 1 dose yest at 2pm and then started on Solumedrol 60 mg BID.   Scheduled Novolog and Lantus to start this AM  Current A1c of 13.1% shows continued poor glucose control at home--per MD notes, pt with "noncompliance with medication due to ability to afford medications"   Addendum 11am--Met w/ pt down in the ED.  Pt told me she has not been able to afford her Lantus and Humalog and has been rationing insulin.  No Lantus for several weeks.  Told me she usually gets assistance from her PCP office with a coupon for the Trulicity but has no assistance with the insulin.  Told me her PCP office told her to go to [facility] and buy the Reli-on 70/30 Insulin and start with 20 units BID at home.  Has not had a chance to do this.  Has CBG meter at home but not checking.  Discussed with pt purchase of inexpensive meter and strips at [facility] and pt stated she has both at home just has not been checking.  Spoke with patient about her  current A1c of 13.1%.  Explained what an A1c is and what it measures.  Reminded patient that her goal A1c is 7% or less per ADA standards to prevent both acute and long-term complications.  Explained to patient the extreme importance of good glucose control at home.  Encouraged patient to check her CBGs at least bid at home (before taking the 70/30 Insulin) and to record all CBGs in a logbook for her PCP to review.  We also discussed what 70/30 Insulin is, when to take, how to take, etc.  We also reviewed signs and symptoms of HYPO and how to treat.  I explained to pt that as her CBGs come back to normal after restarting insulin that she may have HYPO symptoms at CBGs in the low 100s but that eventually her body will re-acclimate to normal CBG levels.  Pt stated she is comfortable giving insulin injections and prefers insulin pens.  Does not need Rx to purchase the 70/30 insulin from [facility].  Stated she has plenty of insulin pen needles at home and can buy more at [facility] when needed.     --Will follow patient during hospitalization--  Wyn Quaker RN, MSN, CDE Diabetes  Coordinator Inpatient Glycemic Control Team Team Pager: [phone] (8a-5p)

## 2020-08-05 LAB — GLUCOSE, CAPILLARY
Glucose-Capillary: 334 mg/dL — ABNORMAL HIGH (ref 70–99)
Glucose-Capillary: 321 mg/dL — ABNORMAL HIGH (ref 70–99)
Glucose-Capillary: 359 mg/dL — ABNORMAL HIGH (ref 70–99)
Glucose-Capillary: 311 mg/dL — ABNORMAL HIGH (ref 70–99)
Glucose-Capillary: 271 mg/dL — ABNORMAL HIGH (ref 70–99)

## 2020-08-05 MED ORDER — INSULIN ASPART PROT & ASPART (70-30 MIX) 100 UNIT/ML ~~LOC~~ SUSP
35.0000 [IU] | Freq: Two times a day (BID) | SUBCUTANEOUS | Status: DC
  Administered 2020-08-05 (×2): 35 [IU] via SUBCUTANEOUS
  Filled 2020-08-05 (×2): qty 10

## 2020-08-05 MED ORDER — INSULIN ASPART 100 UNIT/ML ~~LOC~~ SOLN
0.0000 [IU] | SUBCUTANEOUS | Status: DC
  Administered 2020-08-05: 21:00:00 8 [IU] via SUBCUTANEOUS
  Administered 2020-08-05: 16:00:00 15 [IU] via SUBCUTANEOUS
  Administered 2020-08-06: 2 [IU] via SUBCUTANEOUS
  Administered 2020-08-06: 13:00:00 11 [IU] via SUBCUTANEOUS
  Filled 2020-08-05 (×4): qty 1

## 2020-08-05 MED ORDER — PREDNISONE 20 MG PO TABS
20.0000 mg | ORAL_TABLET | Freq: Every day | ORAL | Status: DC
  Administered 2020-08-06: 09:00:00 20 mg via ORAL
  Filled 2020-08-05: qty 1

## 2020-08-05 NOTE — Progress Notes (Signed)
PROGRESS NOTE    Joan [name]  RSW:[id number] DOB: [birth year] DOA: 08/03/2020 PCP: Doreene Nest, NP   Chief Complaint  Patient presents with  . Chest Pain  . Shortness of Breath   Brief Narrative: 40 year old female with history of moderate persistent asthma, noncompliance with medication due to financial issues, history of COVID-19 infection in 2021 and with recovery, uncontrolled diabetes mellitus on insulin, hyperlipidemia, morbid obesity with BMI 48, hypertension, major depression/PTSD presented to the ED with shortness of breath, chest tightness.  This is her second time visit in 2 to 3 days to the ED initially seen on 07/24/2020 treated for asthma exacerbation and discharged on prednisone and cough medicine but symptoms uncontrolled with those medication and patient was seen again in the ED.  In the ED,work-up showed Tachycardia, tachypnea.  Lab with hyponatremia uncontrolled hyperglycemia 493, stable renal function, COVID-19 negative.  Chest x-ray 12/30 showed no active cardiopulmonary disease. Patient was admitted for acute asthma exacerbation  Subjective: Feels some better, sugar in 300s, voice muffled going on for few days since sickness started. Ambulating to room, on RA  Assessment & Plan:  Acute exacerbation of moderate persistent asthma: Symptomatically improving with mild wheezing, will switch steroid to oral prednisone( 2/2 uncontrolled hyperglycemia and improving COPD exacerbation) monitor blood sugar, continue bronchodilators.    uncontrolled diabetes mellitus on insulin, hemoglobin A1c 13.1, blood sugar remains poorly controlled increasing patient 70/30 insulin further, she was on Lantus 50 units and NovoLog 12 units Premeal couple months ago before she will stop using them due to lack of insurance.  Transitioning to affordable brand of insulin.  DM coordinator on board appreciate.Hopefully as we cut down on the steroid blood sugar will improve close to 200 so that  she can go home. Recent Labs  Lab 08/04/20 0833 08/04/20 1228 08/04/20 1614 08/04/20 1947 08/05/20 0712  GLUCAP 286* 303* 367* 357* 334*   Hyperlipidemia: Continue Crestor lipid panel stable.    Morbid obesity with BMI 48:she will benefit with weight loss and healthy lifestyle and outpatient PCP follow-up.  Hypertension uncontrolled on admission:From steroid and noncompliance.  Major depression/PTSD:On Celexa.  mood is stable. Noncompliance with medication due to financial issues: TOC consulted to help with medication assistance,/insurance. Patient switched her job and has not had insurance  for 3 months or so.  history of COVID-19 infection in 2021 and with recovery  Nutrition: Diet Order            Diet heart healthy/carb modified Room service appropriate? Yes; Fluid consistency: Thin  Diet effective now                 Body mass index is 48.43 kg/m.  DVT prophylaxis: lovenox Code Status:   Code Status: Full Code Family Communication: plan of care discussed with patient at bedside.  Status is: Observation Patient remains short of breath and on iv steroid, has multiple issues along with uncontrolled hyperglycemia unable to afford Lantus/Levemir and we are going to transition her to insulin 70/30 inpatient given severity of hyperglycemia.Patient will remain hospitalized at least 2 midnight.  Dispo: The patient is from: Home              Anticipated d/c is to: Home              Anticipated d/c date is: 1 day              Patient currently is not medically stable to d/c.  Consultants:see note  Procedures:see note  Culture/Microbiology    Component Value Date/Time   SDES THROAT 02/02/2020 1237   SPECREQUEST NONE 02/02/2020 1237   CULT  02/02/2020 1237    NO GROUP A STREP (S.PYOGENES) ISOLATED Performed at [facility], [address] [place], [place] [address]    REPTSTATUS 02/04/2020 FINAL 02/02/2020 1237    Other culture-see  note  Medications: Scheduled Meds: . citalopram  20 mg Oral Daily  . enoxaparin (LOVENOX) injection  0.5 mg/kg Subcutaneous Q24H  . guaiFENesin  600 mg Oral BID  . insulin aspart  0-20 Units Subcutaneous TID AC & HS  . insulin aspart protamine- aspart  35 Units Subcutaneous BID WC  . ipratropium-albuterol  3 mL Nebulization Q4H  . losartan  25 mg Oral Daily  . montelukast  10 mg Oral QHS  . [START ON 08/06/2020] predniSONE  20 mg Oral Q breakfast  . rosuvastatin  10 mg Oral QPM  . sodium chloride flush  3 mL Intravenous Q12H   Continuous Infusions:  Antimicrobials: Anti-infectives (From admission, onward)   None     Objective: Vitals: Today's Vitals   08/04/20 1954 08/05/20 0046 08/05/20 0410 08/05/20 0713  BP:  (!) 148/86 (!) 144/80 124/74  Pulse:  66 62 (!) 57  Resp:  16 16 18   Temp:  97.8 F (36.6 C)  98.4 F (36.9 C)  TempSrc:      SpO2: 96% 97% 96% 99%  Weight:      Height:      PainSc:  0-No pain      Intake/Output Summary (Last 24 hours) at 08/05/2020 0924 Last data filed at 08/04/2020 1829 Gross per 24 hour  Intake 480 ml  Output -  Net 480 ml   Filed Weights   08/03/20 0806  Weight: (!) 136.1 kg   Weight change:   Intake/Output from previous day: 12/31 0701 - 01/01 0700 In: 480 [P.O.:480] Out: -  Intake/Output this shift: No intake/output data recorded.  Examination:  General exam: AAOx3 , NAD, weak appearing. HEENT:Oral mucosa moist, Ear/Nose WNL grossly, dentition normal. Respiratory system: bilaterally air entry present with mild wheezing on expiration,no use of accessory muscle Cardiovascular system: S1 & S2 +, No JVD,. Gastrointestinal system: Abdomen soft, NT,ND, BS+ Nervous System:Alert, awake, moving extremities and grossly nonfocal Extremities: No edema, distal peripheral pulses palpable.  Skin: No rashes,no icterus. MSK: Normal muscle bulk,tone, power  Data Reviewed: I have personally reviewed following labs and imaging  studies CBC: Recent Labs  Lab 08/03/20 0819 08/03/20 1807 08/04/20 0450  WBC 6.0 7.6 9.3  HGB 14.6 14.2 14.5  HCT 43.7 42.3 42.7  MCV 83.7 84.3 82.0  PLT 275 266 315   Basic Metabolic Panel: Recent Labs  Lab 08/03/20 0819 08/03/20 1807 08/04/20 0450  NA 130*  --  132*  K 4.1  --  4.8  CL 94*  --  97*  CO2 27  --  27  GLUCOSE 493*  --  385*  BUN 10  --  16  CREATININE 0.63 0.90 0.67  CALCIUM 9.5  --  9.5  MG  --  1.9  --    GFR: Estimated Creatinine Clearance: 132.8 mL/min (by C-G formula based on SCr of 0.67 mg/dL). Liver Function Tests: Recent Labs  Lab 08/04/20 0450  AST 24  ALT 31  ALKPHOS 104  BILITOT 0.8  PROT 7.4  ALBUMIN 3.9   No results for input(s): LIPASE, AMYLASE in the last 168 hours. No results for input(s): AMMONIA in the  last 168 hours. Coagulation Profile: No results for input(s): INR, PROTIME in the last 168 hours. Cardiac Enzymes: No results for input(s): CKTOTAL, CKMB, CKMBINDEX, TROPONINI in the last 168 hours. BNP (last 3 results) No results for input(s): PROBNP in the last 8760 hours. HbA1C: Recent Labs    08/03/20 1807  HGBA1C 13.1*   CBG: Recent Labs  Lab 08/04/20 0833 08/04/20 1228 08/04/20 1614 08/04/20 1947 08/05/20 0712  GLUCAP 286* 303* 367* 357* 334*   Lipid Profile: No results for input(s): CHOL, HDL, LDLCALC, TRIG, CHOLHDL, LDLDIRECT in the last 72 hours. Thyroid Function Tests: Recent Labs    08/03/20 1807  TSH 1.211   Anemia Panel: No results for input(s): VITAMINB12, FOLATE, FERRITIN, TIBC, IRON, RETICCTPCT in the last 72 hours. Sepsis Labs: No results for input(s): PROCALCITON, LATICACIDVEN in the last 168 hours.  Recent Results (from the past 240 hour(s))  Resp Panel by RT-PCR (Flu A&B, Covid) Nasopharyngeal Swab     Status: None   Collection Time: 07/26/20  4:06 PM   Specimen: Nasopharyngeal Swab; Nasopharyngeal(NP) swabs in vial transport medium  Result Value Ref Range Status   SARS Coronavirus  2 by RT PCR NEGATIVE NEGATIVE Final    Comment: (NOTE) SARS-CoV-2 target nucleic acids are NOT DETECTED.  The SARS-CoV-2 RNA is generally detectable in upper respiratory specimens during the acute phase of infection. The lowest concentration of SARS-CoV-2 viral copies this assay can detect is 138 copies/mL. A negative result does not preclude SARS-Cov-2 infection and should not be used as the sole basis for treatment or other patient management decisions. A negative result may occur with  improper specimen collection/handling, submission of specimen other than nasopharyngeal swab, presence of viral mutation(s) within the areas targeted by this assay, and inadequate number of viral copies(<138 copies/mL). A negative result must be combined with clinical observations, patient history, and epidemiological information. The expected result is Negative.  Fact Sheet for Patients:  BloggerCourse.com  Fact Sheet for Healthcare Providers:  SeriousBroker.it  This test is no t yet approved or cleared by the [place] FDA and  has been authorized for detection and/or diagnosis of SARS-CoV-2 by FDA under an Emergency Use Authorization (EUA). This EUA will remain  in effect (meaning this test can be used) for the duration of the COVID-19 declaration under Section 564(b)(1) of the Act, 21 U.S.C.section 360bbb-3(b)(1), unless the authorization is terminated  or revoked sooner.       Influenza A by PCR NEGATIVE NEGATIVE Final   Influenza B by PCR NEGATIVE NEGATIVE Final    Comment: (NOTE) The Xpert Xpress SARS-CoV-2/FLU/RSV plus assay is intended as an aid in the diagnosis of influenza from Nasopharyngeal swab specimens and should not be used as a sole basis for treatment. Nasal washings and aspirates are unacceptable for Xpert Xpress SARS-CoV-2/FLU/RSV testing.  Fact Sheet for Patients: BloggerCourse.com  Fact  Sheet for Healthcare Providers: SeriousBroker.it  This test is not yet approved or cleared by the [place] FDA and has been authorized for detection and/or diagnosis of SARS-CoV-2 by FDA under an Emergency Use Authorization (EUA). This EUA will remain in effect (meaning this test can be used) for the duration of the COVID-19 declaration under Section 564(b)(1) of the Act, 21 U.S.C. section 360bbb-3(b)(1), unless the authorization is terminated or revoked.  Performed at [facility], [address], [place], [place] [address]   Resp Panel by RT-PCR (Flu A&B, Covid) Nasopharyngeal Swab     Status: None   Collection Time: 08/01/20  10:12 AM   Specimen: Nasopharyngeal Swab; Nasopharyngeal(NP) swabs in vial transport medium  Result Value Ref Range Status   SARS Coronavirus 2 by RT PCR NEGATIVE NEGATIVE Final    Comment: (NOTE) SARS-CoV-2 target nucleic acids are NOT DETECTED.  The SARS-CoV-2 RNA is generally detectable in upper respiratory specimens during the acute phase of infection. The lowest concentration of SARS-CoV-2 viral copies this assay can detect is 138 copies/mL. A negative result does not preclude SARS-Cov-2 infection and should not be used as the sole basis for treatment or other patient management decisions. A negative result may occur with  improper specimen collection/handling, submission of specimen other than nasopharyngeal swab, presence of viral mutation(s) within the areas targeted by this assay, and inadequate number of viral copies(<138 copies/mL). A negative result must be combined with clinical observations, patient history, and epidemiological information. The expected result is Negative.  Fact Sheet for Patients:  EntrepreneurPulse.com.au  Fact Sheet for Healthcare Providers:  IncredibleEmployment.be  This test is no t yet approved or cleared by the [place] FDA and  has  been authorized for detection and/or diagnosis of SARS-CoV-2 by FDA under an Emergency Use Authorization (EUA). This EUA will remain  in effect (meaning this test can be used) for the duration of the COVID-19 declaration under Section 564(b)(1) of the Act, 21 U.S.C.section 360bbb-3(b)(1), unless the authorization is terminated  or revoked sooner.       Influenza A by PCR NEGATIVE NEGATIVE Final   Influenza B by PCR NEGATIVE NEGATIVE Final    Comment: (NOTE) The Xpert Xpress SARS-CoV-2/FLU/RSV plus assay is intended as an aid in the diagnosis of influenza from Nasopharyngeal swab specimens and should not be used as a sole basis for treatment. Nasal washings and aspirates are unacceptable for Xpert Xpress SARS-CoV-2/FLU/RSV testing.  Fact Sheet for Patients: EntrepreneurPulse.com.au  Fact Sheet for Healthcare Providers: IncredibleEmployment.be  This test is not yet approved or cleared by the [place] FDA and has been authorized for detection and/or diagnosis of SARS-CoV-2 by FDA under an Emergency Use Authorization (EUA). This EUA will remain in effect (meaning this test can be used) for the duration of the COVID-19 declaration under Section 564(b)(1) of the Act, 21 U.S.C. section 360bbb-3(b)(1), unless the authorization is terminated or revoked.  Performed at [facility], [address] [place], [place] [address]   Resp Panel by RT-PCR (Flu A&B, Covid) Nasopharyngeal Swab     Status: None   Collection Time: 08/03/20 10:17 PM   Specimen: Nasopharyngeal Swab; Nasopharyngeal(NP) swabs in vial transport medium  Result Value Ref Range Status   SARS Coronavirus 2 by RT PCR NEGATIVE NEGATIVE Final    Comment: (NOTE) SARS-CoV-2 target nucleic acids are NOT DETECTED.  The SARS-CoV-2 RNA is generally detectable in upper respiratory specimens during the acute phase of infection. The lowest concentration of SARS-CoV-2 viral copies  this assay can detect is 138 copies/mL. A negative result does not preclude SARS-Cov-2 infection and should not be used as the sole basis for treatment or other patient management decisions. A negative result may occur with  improper specimen collection/handling, submission of specimen other than nasopharyngeal swab, presence of viral mutation(s) within the areas targeted by this assay, and inadequate number of viral copies(<138 copies/mL). A negative result must be combined with clinical observations, patient history, and epidemiological information. The expected result is Negative.  Fact Sheet for Patients:  EntrepreneurPulse.com.au  Fact Sheet for Healthcare Providers:  IncredibleEmployment.be  This test is no t yet approved or  cleared by the [place] and  has been authorized for detection and/or diagnosis of SARS-CoV-2 by FDA under an Emergency Use Authorization (EUA). This EUA will remain  in effect (meaning this test can be used) for the duration of the COVID-19 declaration under Section 564(b)(1) of the Act, 21 U.S.C.section 360bbb-3(b)(1), unless the authorization is terminated  or revoked sooner.       Influenza A by PCR NEGATIVE NEGATIVE Final   Influenza B by PCR NEGATIVE NEGATIVE Final    Comment: (NOTE) The Xpert Xpress SARS-CoV-2/FLU/RSV plus assay is intended as an aid in the diagnosis of influenza from Nasopharyngeal swab specimens and should not be used as a sole basis for treatment. Nasal washings and aspirates are unacceptable for Xpert Xpress SARS-CoV-2/FLU/RSV testing.  Fact Sheet for Patients: BloggerCourse.com  Fact Sheet for Healthcare Providers: SeriousBroker.it  This test is not yet approved or cleared by the [place] FDA and has been authorized for detection and/or diagnosis of SARS-CoV-2 by FDA under an Emergency Use Authorization (EUA). This EUA  will remain in effect (meaning this test can be used) for the duration of the COVID-19 declaration under Section 564(b)(1) of the Act, 21 U.S.C. section 360bbb-3(b)(1), unless the authorization is terminated or revoked.  Performed at [facility], [address] [place], [place] [address]      Radiology Studies: No results found.   LOS: 1 day   Lanae Boast, MD [facility]  08/05/2020, 9:24 AM

## 2020-08-06 ENCOUNTER — Other Ambulatory Visit: Payer: Self-pay | Admitting: Internal Medicine

## 2020-08-06 LAB — GLUCOSE, CAPILLARY
Glucose-Capillary: 116 mg/dL — ABNORMAL HIGH (ref 70–99)
Glucose-Capillary: 118 mg/dL — ABNORMAL HIGH (ref 70–99)
Glucose-Capillary: 127 mg/dL — ABNORMAL HIGH (ref 70–99)
Glucose-Capillary: 145 mg/dL — ABNORMAL HIGH (ref 70–99)
Glucose-Capillary: 344 mg/dL — ABNORMAL HIGH (ref 70–99)

## 2020-08-06 MED ORDER — INSULIN ASPART PROT & ASPART (70-30 MIX) 100 UNIT/ML ~~LOC~~ SUSP: 28.0000 [IU] | Freq: Two times a day (BID) | SUBCUTANEOUS | 0 refills | Status: DC

## 2020-08-06 MED ORDER — PREDNISONE 10 MG PO TABS: 20.0000 mg | ORAL_TABLET | Freq: Every day | ORAL | 0 refills | Status: AC

## 2020-08-06 MED ORDER — INSULIN ASPART PROT & ASPART (70-30 MIX) 100 UNIT/ML ~~LOC~~ SUSP
28.0000 [IU] | Freq: Two times a day (BID) | SUBCUTANEOUS | Status: DC
  Administered 2020-08-06: 28 [IU] via SUBCUTANEOUS
  Filled 2020-08-06: qty 10

## 2020-08-06 NOTE — Progress Notes (Signed)
Patient given verbal and written discharge instructions. Iv's removed. Personal belongings at bedside.

## 2020-08-06 NOTE — Discharge Summary (Signed)
Physician Discharge Summary  Joan [name] WHQ:[id number] DOB: April 23, 1981 DOA: 08/03/2020  PCP: Pleas Koch, NP  Admit date: 08/03/2020 Discharge date: 08/06/2020  Admitted From: home Disposition:  home  Recommendations for Outpatient Follow-up:  1. Follow up with PCP in 1-2 weeks 2. Please obtain BMP/CBC in one week 3. Please follow up on the following pending results:  Home Health:no  Equipment/Devices: none  Discharge Condition: Stable Code Status:   Code Status: Full Code Diet recommendation:  Diet Order            Diet - low sodium heart healthy           Diet heart healthy/carb modified Room service appropriate? Yes; Fluid consistency: Thin  Diet effective now                  Brief/Interim Summary: 40 year old female with history of moderate persistent asthma, noncompliance with medication due to financial issues, history of COVID-19 infection in 2021 and with recovery, uncontrolled diabetes mellitus on insulin, hyperlipidemia, morbid obesity with BMI 48, hypertension, major depression/PTSD presented to the ED with shortness of breath, chest tightness.  This is her second time visit in 2 to 3 days to the ED initially seen on 07/24/2020 treated for asthma exacerbation and discharged on prednisone and cough medicine but symptoms uncontrolled with those medication and patient was seen again in the ED.  In the ED,work-up showed Tachycardia, tachypnea.  Lab with hyponatremia uncontrolled hyperglycemia 493, stable renal function, COVID-19 negative.  Chest x-ray 12/30 showed no active cardiopulmonary disease. Patient was admitted for acute asthma exacerbation. Patient was treated with IV steroids insulin regimen was initiated.  At this time breathing is improving ceased on oral steroid blood sugar has significantly stabilized on 70/30 regimen so that she can afford. She feels well and is being discharged home today.  Social worker has arranged for outpatient  medication  Discharge Diagnoses:   Acute exacerbation of moderate persistent asthma: Improved, continue prednisone 20 mg for few more days.   uncontrolled diabetes mellitus on insulin, hemoglobin A1c 13.1, blood sugar is now well controlled, will cut down novolog 70/30 regimen at discharge to 24 units twice daily with instruction to monitor blood sugar 4 times a day at home and adjust insulin  To avoid hypoglycemia hypoglycemia. counseled about hypoglycemia signs symptoms  And she si already aware of it. Instruction provided and she is to follow-up with her MD early next week to monitor and adjust insulin regimen.   Recent Labs  Lab 08/05/20 1953 08/06/20 0011 08/06/20 0226 08/06/20 0355 08/06/20 0811  GLUCAP 271* 145* 127* 118* 116*   Hyperlipidemia: Continue Crestor lipid panel stable.   Morbid obesity with BMI 48:she will benefit with weight loss and healthy lifestyle and outpatient PCP follow-up. Hypertension uncontrolled on admission:From steroid and noncompliance.cont her home meds Major depression/PTSD:On Celexa.  mood is stable. Noncompliance with medication due to financial issues: TOC consulted to help with medication assistance,/insurance. Patient switched her job and has not had insurance  for 3 months or so. history of COVID-19 infection in 2021 and with recovery   Consults:  CM  Subjective: Alert and oriented x3, no shortness of breath.  BLOOD SUGAR  In 116  Discharge Exam: Vitals:   08/06/20 0359 08/06/20 0810  BP: 115/72 122/77  Pulse: 71 88  Resp: 17 16  Temp: 97.6 F (36.4 C) 98.4 F (36.9 C)  SpO2: 100% 100%   General: Pt is alert, awake, not in acute distress Cardiovascular:  RRR, S1/S2 +, no rubs, no gallops Respiratory: CTA bilaterally, no wheezing, no rhonchi Abdominal: Soft, NT, ND, bowel sounds + Extremities: no edema, no cyanosis  Discharge Instructions  Discharge Instructions    Diet - low sodium heart healthy   Complete by: As directed     Discharge instructions   Complete by: As directed    Please call call MD or return to ER for similar or worsening recurring problem that brought you to hospital or if any fever,nausea/vomiting,abdominal pain, uncontrolled pain, chest pain,  shortness of breath or any other alarming symptoms.  Please follow-up your doctor as instructed in a week time and call the office for appointment.  Please avoid alcohol, smoking, or any other illicit substance and maintain healthy habits including taking your regular medications as prescribed.  You were cared for by a hospitalist during your hospital stay. If you have any questions about your discharge medications or the care you received while you were in the hospital after you are discharged, you can call the unit and ask to speak with the hospitalist on call if the hospitalist that took care of you is not available.  Once you are discharged, your primary care physician will handle any further medical issues. Please note that NO REFILLS for any discharge medications will be authorized once you are discharged, as it is imperative that you return to your primary care physician (or establish a relationship with a primary care physician if you do not have one) for your aftercare needs so that they can reassess your need for medications and monitor your lab values   Check blood sugar 3 times a day and bedtime at home. If blood sugar running above 200 less than 70 please call your MD to adjust insulin. If blood sugars running less 100 do not use insulin and call MD to adjust dose. If you noticed signs and symptoms of hypoglycemia or low blood sugar like jitteriness, confusion, thirst, tremor, sweating- Check blood sugar, drink sugary drink/biscuits/sweets to increase sugar level and call MD or return to ER.   Increase activity slowly   Complete by: As directed      Allergies as of 08/06/2020      Reactions   Ibuprofen Swelling   Facial    Ciprofloxacin  Hives, Rash   Metformin And Related Other (See Comments)   Elevated Lactic Acid   Penicillins Hives, Rash   Has patient had a PCN reaction causing immediate rash, facial/tongue/throat swelling, SOB or lightheadedness with hypotension: No Has patient had a PCN reaction causing severe rash involving mucus membranes or skin necrosis: No Has patient had a PCN reaction that required hospitalization: No Has patient had a PCN reaction occurring within the last 10 years: No If all of the above answers are "NO", then may proceed with Cephalosporin use. THE PATIENT IS ABLE TO TOLERATE CEPHALOSPORINS WITHOUT DIFFIC   Sulfa Antibiotics Hives, Rash   Other    ALL NUTS-SCRATCHY THROAT   Shellfish Allergy Other (See Comments)   ALL SEAFOOD-SCRATCHY THROAT      Medication List    STOP taking these medications   insulin lispro 100 UNIT/ML KwikPen Commonly known as: HumaLOG KwikPen   Lantus SoloStar 100 UNIT/ML Solostar Pen Generic drug: insulin glargine   Trulicity 1.5 KT/6.2BW Sopn Generic drug: Dulaglutide     TAKE these medications   albuterol 108 (90 Base) MCG/ACT inhaler Commonly known as: Ventolin HFA INHALE 2 PUFFS BY MOUTH EVERY 6 HOURS AS NEEDED FOR SHORTNESS OF  BREATH AND WHEEZING What changed:   how much to take  how to take this  when to take this  reasons to take this   blood glucose meter kit and supplies Kit Dispense based on patient and insurance preference. Use up three times daily as directed. (FOR ICD-9 250.00, 250.01).   brompheniramine-pseudoephedrine-DM 30-2-10 MG/5ML syrup Take 5 mLs by mouth 4 (four) times daily as needed.   citalopram 20 MG tablet Commonly known as: CELEXA Take 1 tablet (20 mg total) by mouth daily. For anxiety.   insulin aspart protamine- aspart (70-30) 100 UNIT/ML injection Commonly known as: NOVOLOG MIX 70/30 Inject 0.28 mLs (28 Units total) into the skin 2 (two) times daily with a meal.   losartan 25 MG tablet Commonly known as:  Cozaar Take 1 tablet (25 mg total) by mouth daily. For blood pressure.   montelukast 10 MG tablet Commonly known as: SINGULAIR Take 10 mg by mouth at bedtime.   Pen Needles 31G X 6 MM Misc Use with insulin as directed.   predniSONE 10 MG tablet Commonly known as: DELTASONE Take 2 tablets (20 mg total) by mouth daily with breakfast for 4 days. Take 5 tablets  tomorrow, on day 2 take 4 tablets, day 3 take 3 tablets, day 4 take 2 tablets, day 5 take  1 tablets What changed:   how much to take  how to take this  when to take this   rosuvastatin 10 MG tablet Commonly known as: Crestor Take 1 tablet (10 mg total) by mouth every evening. For cholesterol.       Follow-up Information    Pleas Koch, NP Follow up in 3 day(s).   Specialty: Internal Medicine Contact information: [place] [address] [phone]              Allergies  Allergen Reactions  . Ibuprofen Swelling    Facial   . Ciprofloxacin Hives and Rash  . Metformin And Related Other (See Comments)    Elevated Lactic Acid  . Penicillins Hives and Rash    Has patient had a PCN reaction causing immediate rash, facial/tongue/throat swelling, SOB or lightheadedness with hypotension: No Has patient had a PCN reaction causing severe rash involving mucus membranes or skin necrosis: No Has patient had a PCN reaction that required hospitalization: No Has patient had a PCN reaction occurring within the last 10 years: No If all of the above answers are "NO", then may proceed with Cephalosporin use. THE PATIENT IS ABLE TO TOLERATE CEPHALOSPORINS WITHOUT DIFFIC  . Sulfa Antibiotics Hives and Rash  . Other     ALL NUTS-SCRATCHY THROAT  . Shellfish Allergy Other (See Comments)    ALL SEAFOOD-SCRATCHY THROAT    The results of significant diagnostics from this hospitalization (including imaging, microbiology, ancillary and laboratory) are listed below for reference.    Microbiology: Recent  Results (from the past 240 hour(s))  Resp Panel by RT-PCR (Flu A&B, Covid) Nasopharyngeal Swab     Status: None   Collection Time: 08/01/20 10:12 AM   Specimen: Nasopharyngeal Swab; Nasopharyngeal(NP) swabs in vial transport medium  Result Value Ref Range Status   SARS Coronavirus 2 by RT PCR NEGATIVE NEGATIVE Final    Comment: (NOTE) SARS-CoV-2 target nucleic acids are NOT DETECTED.  The SARS-CoV-2 RNA is generally detectable in upper respiratory specimens during the acute phase of infection. The lowest concentration of SARS-CoV-2 viral copies this assay can detect is 138 copies/mL. A negative result does  not preclude SARS-Cov-2 infection and should not be used as the sole basis for treatment or other patient management decisions. A negative result may occur with  improper specimen collection/handling, submission of specimen other than nasopharyngeal swab, presence of viral mutation(s) within the areas targeted by this assay, and inadequate number of viral copies(<138 copies/mL). A negative result must be combined with clinical observations, patient history, and epidemiological information. The expected result is Negative.  Fact Sheet for Patients:  EntrepreneurPulse.com.au  Fact Sheet for Healthcare Providers:  IncredibleEmployment.be  This test is no t yet approved or cleared by the [place] FDA and  has been authorized for detection and/or diagnosis of SARS-CoV-2 by FDA under an Emergency Use Authorization (EUA). This EUA will remain  in effect (meaning this test can be used) for the duration of the COVID-19 declaration under Section 564(b)(1) of the Act, 21 U.S.C.section 360bbb-3(b)(1), unless the authorization is terminated  or revoked sooner.       Influenza A by PCR NEGATIVE NEGATIVE Final   Influenza B by PCR NEGATIVE NEGATIVE Final    Comment: (NOTE) The Xpert Xpress SARS-CoV-2/FLU/RSV plus assay is intended as an aid in the  diagnosis of influenza from Nasopharyngeal swab specimens and should not be used as a sole basis for treatment. Nasal washings and aspirates are unacceptable for Xpert Xpress SARS-CoV-2/FLU/RSV testing.  Fact Sheet for Patients: EntrepreneurPulse.com.au  Fact Sheet for Healthcare Providers: IncredibleEmployment.be  This test is not yet approved or cleared by the [place] FDA and has been authorized for detection and/or diagnosis of SARS-CoV-2 by FDA under an Emergency Use Authorization (EUA). This EUA will remain in effect (meaning this test can be used) for the duration of the COVID-19 declaration under Section 564(b)(1) of the Act, 21 U.S.C. section 360bbb-3(b)(1), unless the authorization is terminated or revoked.  Performed at [facility], [address] [place], [place] [address]   Resp Panel by RT-PCR (Flu A&B, Covid) Nasopharyngeal Swab     Status: None   Collection Time: 08/03/20 10:17 PM   Specimen: Nasopharyngeal Swab; Nasopharyngeal(NP) swabs in vial transport medium  Result Value Ref Range Status   SARS Coronavirus 2 by RT PCR NEGATIVE NEGATIVE Final    Comment: (NOTE) SARS-CoV-2 target nucleic acids are NOT DETECTED.  The SARS-CoV-2 RNA is generally detectable in upper respiratory specimens during the acute phase of infection. The lowest concentration of SARS-CoV-2 viral copies this assay can detect is 138 copies/mL. A negative result does not preclude SARS-Cov-2 infection and should not be used as the sole basis for treatment or other patient management decisions. A negative result may occur with  improper specimen collection/handling, submission of specimen other than nasopharyngeal swab, presence of viral mutation(s) within the areas targeted by this assay, and inadequate number of viral copies(<138 copies/mL). A negative result must be combined with clinical observations, patient history, and  epidemiological information. The expected result is Negative.  Fact Sheet for Patients:  EntrepreneurPulse.com.au  Fact Sheet for Healthcare Providers:  IncredibleEmployment.be  This test is no t yet approved or cleared by the [place] FDA and  has been authorized for detection and/or diagnosis of SARS-CoV-2 by FDA under an Emergency Use Authorization (EUA). This EUA will remain  in effect (meaning this test can be used) for the duration of the COVID-19 declaration under Section 564(b)(1) of the Act, 21 U.S.C.section 360bbb-3(b)(1), unless the authorization is terminated  or revoked sooner.       Influenza A by PCR NEGATIVE NEGATIVE Final  Influenza B by PCR NEGATIVE NEGATIVE Final    Comment: (NOTE) The Xpert Xpress SARS-CoV-2/FLU/RSV plus assay is intended as an aid in the diagnosis of influenza from Nasopharyngeal swab specimens and should not be used as a sole basis for treatment. Nasal washings and aspirates are unacceptable for Xpert Xpress SARS-CoV-2/FLU/RSV testing.  Fact Sheet for Patients: EntrepreneurPulse.com.au  Fact Sheet for Healthcare Providers: IncredibleEmployment.be  This test is not yet approved or cleared by the [place] FDA and has been authorized for detection and/or diagnosis of SARS-CoV-2 by FDA under an Emergency Use Authorization (EUA). This EUA will remain in effect (meaning this test can be used) for the duration of the COVID-19 declaration under Section 564(b)(1) of the Act, 21 U.S.C. section 360bbb-3(b)(1), unless the authorization is terminated or revoked.  Performed at [facility], [address] [place], [place] [address]     Procedures/Studies: DG Chest 2 View  Result Date: 08/03/2020 CLINICAL DATA:  Chest pain and shortness of breath EXAM: CHEST - 2 VIEW COMPARISON:  08/01/2020 FINDINGS: The heart size and mediastinal contours are within  normal limits. Both lungs are clear. The visualized skeletal structures are unremarkable. IMPRESSION: No active cardiopulmonary disease. Electronically Signed   By: Jerilynn Mages.  Shick M.D.   On: 08/03/2020 08:42   DG Chest 2 View  Result Date: 08/01/2020 CLINICAL DATA:  40 year old female with recurrent cough and congestion. Tested negative for COVID-19 last week. EXAM: CHEST - 2 VIEW COMPARISON:  Chest radiographs 07/24/2020 and earlier. FINDINGS: Lung volumes and mediastinal contours are stable and within normal limits. Visualized tracheal air column is within normal limits. No pneumothorax, pulmonary edema, pleural effusion or confluent pulmonary opacity. Lung markings appear stable since July. No acute osseous abnormality identified. Stable upper abdominal cholecystectomy clips. Negative visible bowel gas pattern. IMPRESSION: No acute cardiopulmonary abnormality. Electronically Signed   By: Genevie Ann M.D.   On: 08/01/2020 10:43   DG Chest 2 View  Result Date: 07/24/2020 CLINICAL DATA:  Cough and shortness of breath EXAM: CHEST - 2 VIEW COMPARISON:  February 04, 2020 FINDINGS: The lungs are clear. The heart size and pulmonary vascularity are normal. No adenopathy. No bone lesions. IMPRESSION: Lungs clear.  Cardiac silhouette normal. Electronically Signed   By: Lowella Grip III M.D.   On: 07/24/2020 08:48    Labs: BNP (last 3 results) No results for input(s): BNP in the last 8760 hours. Basic Metabolic Panel: Recent Labs  Lab 08/03/20 0819 08/03/20 1807 08/04/20 0450  NA 130*  --  132*  K 4.1  --  4.8  CL 94*  --  97*  CO2 27  --  27  GLUCOSE 493*  --  385*  BUN 10  --  16  CREATININE 0.63 0.90 0.67  CALCIUM 9.5  --  9.5  MG  --  1.9  --    Liver Function Tests: Recent Labs  Lab 08/04/20 0450  AST 24  ALT 31  ALKPHOS 104  BILITOT 0.8  PROT 7.4  ALBUMIN 3.9   No results for input(s): LIPASE, AMYLASE in the last 168 hours. No results for input(s): AMMONIA in the last 168  hours. CBC: Recent Labs  Lab 08/03/20 0819 08/03/20 1807 08/04/20 0450  WBC 6.0 7.6 9.3  HGB 14.6 14.2 14.5  HCT 43.7 42.3 42.7  MCV 83.7 84.3 82.0  PLT 275 266 315   Cardiac Enzymes: No results for input(s): CKTOTAL, CKMB, CKMBINDEX, TROPONINI in the last 168 hours. BNP: Invalid input(s): POCBNP CBG:  Recent Labs  Lab 08/05/20 1953 08/06/20 0011 08/06/20 0226 08/06/20 0355 08/06/20 0811  GLUCAP 271* 145* 127* 118* 116*   D-Dimer No results for input(s): DDIMER in the last 72 hours. Hgb A1c Recent Labs    08/03/20 1807  HGBA1C 13.1*   Lipid Profile No results for input(s): CHOL, HDL, LDLCALC, TRIG, CHOLHDL, LDLDIRECT in the last 72 hours. Thyroid function studies Recent Labs    08/03/20 1807  TSH 1.211   Anemia work up No results for input(s): VITAMINB12, FOLATE, FERRITIN, TIBC, IRON, RETICCTPCT in the last 72 hours. Urinalysis    Component Value Date/Time   COLORURINE YELLOW (A) 03/16/2020 1327   APPEARANCEUR CLEAR (A) 03/16/2020 1327   LABSPEC 1.033 (H) 03/16/2020 1327   PHURINE 6.0 03/16/2020 1327   GLUCOSEU >=500 (A) 03/16/2020 1327   HGBUR NEGATIVE 03/16/2020 1327   BILIRUBINUR NEGATIVE 03/16/2020 1327   BILIRUBINUR negative 03/16/2020 1050   BILIRUBINUR neg 08/20/2019 2029   KETONESUR NEGATIVE 03/16/2020 1327   KETONESUR negative 03/16/2020 1050   PROTEINUR NEGATIVE 03/16/2020 1327   PROTEINUR negative 03/16/2020 1050   UROBILINOGEN 0.2 03/16/2020 1050   UROBILINOGEN 0.2 05/07/2015 1500   NITRITE NEGATIVE 03/16/2020 1327   NITRITE Negative 03/16/2020 1050   LEUKOCYTESUR NEGATIVE 03/16/2020 1327   LEUKOCYTESUR Negative 03/16/2020 1050   Sepsis Labs Invalid input(s): PROCALCITONIN,  WBC,  LACTICIDVEN Microbiology Recent Results (from the past 240 hour(s))  Resp Panel by RT-PCR (Flu A&B, Covid) Nasopharyngeal Swab     Status: None   Collection Time: 08/01/20 10:12 AM   Specimen: Nasopharyngeal Swab; Nasopharyngeal(NP) swabs in vial transport  medium  Result Value Ref Range Status   SARS Coronavirus 2 by RT PCR NEGATIVE NEGATIVE Final    Comment: (NOTE) SARS-CoV-2 target nucleic acids are NOT DETECTED.  The SARS-CoV-2 RNA is generally detectable in upper respiratory specimens during the acute phase of infection. The lowest concentration of SARS-CoV-2 viral copies this assay can detect is 138 copies/mL. A negative result does not preclude SARS-Cov-2 infection and should not be used as the sole basis for treatment or other patient management decisions. A negative result may occur with  improper specimen collection/handling, submission of specimen other than nasopharyngeal swab, presence of viral mutation(s) within the areas targeted by this assay, and inadequate number of viral copies(<138 copies/mL). A negative result must be combined with clinical observations, patient history, and epidemiological information. The expected result is Negative.  Fact Sheet for Patients:  EntrepreneurPulse.com.au  Fact Sheet for Healthcare Providers:  IncredibleEmployment.be  This test is no t yet approved or cleared by the [place] FDA and  has been authorized for detection and/or diagnosis of SARS-CoV-2 by FDA under an Emergency Use Authorization (EUA). This EUA will remain  in effect (meaning this test can be used) for the duration of the COVID-19 declaration under Section 564(b)(1) of the Act, 21 U.S.C.section 360bbb-3(b)(1), unless the authorization is terminated  or revoked sooner.       Influenza A by PCR NEGATIVE NEGATIVE Final   Influenza B by PCR NEGATIVE NEGATIVE Final    Comment: (NOTE) The Xpert Xpress SARS-CoV-2/FLU/RSV plus assay is intended as an aid in the diagnosis of influenza from Nasopharyngeal swab specimens and should not be used as a sole basis for treatment. Nasal washings and aspirates are unacceptable for Xpert Xpress SARS-CoV-2/FLU/RSV testing.  Fact Sheet for  Patients: EntrepreneurPulse.com.au  Fact Sheet for Healthcare Providers: IncredibleEmployment.be  This test is not yet approved or cleared by the [place] and  has been authorized for detection and/or diagnosis of SARS-CoV-2 by FDA under an Emergency Use Authorization (EUA). This EUA will remain in effect (meaning this test can be used) for the duration of the COVID-19 declaration under Section 564(b)(1) of the Act, 21 U.S.C. section 360bbb-3(b)(1), unless the authorization is terminated or revoked.  Performed at [facility], [address] [place], [place] [address]   Resp Panel by RT-PCR (Flu A&B, Covid) Nasopharyngeal Swab     Status: None   Collection Time: 08/03/20 10:17 PM   Specimen: Nasopharyngeal Swab; Nasopharyngeal(NP) swabs in vial transport medium  Result Value Ref Range Status   SARS Coronavirus 2 by RT PCR NEGATIVE NEGATIVE Final    Comment: (NOTE) SARS-CoV-2 target nucleic acids are NOT DETECTED.  The SARS-CoV-2 RNA is generally detectable in upper respiratory specimens during the acute phase of infection. The lowest concentration of SARS-CoV-2 viral copies this assay can detect is 138 copies/mL. A negative result does not preclude SARS-Cov-2 infection and should not be used as the sole basis for treatment or other patient management decisions. A negative result may occur with  improper specimen collection/handling, submission of specimen other than nasopharyngeal swab, presence of viral mutation(s) within the areas targeted by this assay, and inadequate number of viral copies(<138 copies/mL). A negative result must be combined with clinical observations, patient history, and epidemiological information. The expected result is Negative.  Fact Sheet for Patients:  EntrepreneurPulse.com.au  Fact Sheet for Healthcare Providers:  IncredibleEmployment.be  This test is no t  yet approved or cleared by the [place] FDA and  has been authorized for detection and/or diagnosis of SARS-CoV-2 by FDA under an Emergency Use Authorization (EUA). This EUA will remain  in effect (meaning this test can be used) for the duration of the COVID-19 declaration under Section 564(b)(1) of the Act, 21 U.S.C.section 360bbb-3(b)(1), unless the authorization is terminated  or revoked sooner.       Influenza A by PCR NEGATIVE NEGATIVE Final   Influenza B by PCR NEGATIVE NEGATIVE Final    Comment: (NOTE) The Xpert Xpress SARS-CoV-2/FLU/RSV plus assay is intended as an aid in the diagnosis of influenza from Nasopharyngeal swab specimens and should not be used as a sole basis for treatment. Nasal washings and aspirates are unacceptable for Xpert Xpress SARS-CoV-2/FLU/RSV testing.  Fact Sheet for Patients: EntrepreneurPulse.com.au  Fact Sheet for Healthcare Providers: IncredibleEmployment.be  This test is not yet approved or cleared by the [place] FDA and has been authorized for detection and/or diagnosis of SARS-CoV-2 by FDA under an Emergency Use Authorization (EUA). This EUA will remain in effect (meaning this test can be used) for the duration of the COVID-19 declaration under Section 564(b)(1) of the Act, 21 U.S.C. section 360bbb-3(b)(1), unless the authorization is terminated or revoked.  Performed at [facility], [address], [place], [place] [address]      Time coordinating discharge: 25 minutes  SIGNED: Antonieta Pert, MD  [facility] 08/06/2020, 11:29 AM  If 7PM-7AM, please contact [email or web address] [email or web address]

## 2020-08-06 NOTE — Discharge Instructions (Signed)
Asthma, Adult ° °Asthma is a long-term (chronic) condition in which the airways get tight and narrow. The airways are the breathing passages that lead from the nose and mouth down into the lungs. A person with asthma will have times when symptoms get worse. These are called asthma attacks. They can cause coughing, whistling sounds when you breathe (wheezing), shortness of breath, and chest pain. They can make it hard to breathe. There is no cure for asthma, but medicines and lifestyle changes can help control it. °There are many things that can bring on an asthma attack or make asthma symptoms worse (triggers). Common triggers include: °· Mold. °· Dust. °· Cigarette smoke. °· Cockroaches. °· Things that can cause allergy symptoms (allergens). These include animal skin flakes (dander) and pollen from trees or grass. °· Things that pollute the air. These may include household cleaners, wood smoke, smog, or chemical odors. °· Cold air, weather changes, and wind. °· Crying or laughing hard. °· Stress. °· Certain medicines or drugs. °· Certain foods such as dried fruit, potato chips, and grape juice. °· Infections, such as a cold or the flu. °· Certain medical conditions or diseases. °· Exercise or tiring activities. °Asthma may be treated with medicines and by staying away from the things that cause asthma attacks. Types of medicines may include: °· Controller medicines. These help prevent asthma symptoms. They are usually taken every day. °· Fast-acting reliever or rescue medicines. These quickly relieve asthma symptoms. They are used as needed and provide short-term relief. °· Allergy medicines if your attacks are brought on by allergens. °· Medicines to help control the body's defense (immune) system. °Follow these instructions at home: °Avoiding triggers in your home °· Change your heating and air conditioning filter often. °· Limit your use of fireplaces and wood stoves. °· Get rid of pests (such as roaches and  mice) and their droppings. °· Throw away plants if you see mold on them. °· Clean your floors. Dust regularly. Use cleaning products that do not smell. °· Have someone vacuum when you are not home. Use a vacuum cleaner with a HEPA filter if possible. °· Replace carpet with wood, tile, or vinyl flooring. Carpet can trap animal skin flakes and dust. °· Use allergy-proof pillows, mattress covers, and box spring covers. °· Wash bed sheets and blankets every week in hot water. Dry them in a dryer. °· Keep your bedroom free of any triggers. °· Avoid pets and keep windows closed when things that cause allergy symptoms are in the air. °· Use blankets that are made of polyester or cotton. °· Clean bathrooms and kitchens with bleach. If possible, have someone repaint the walls in these rooms with mold-resistant paint. Keep out of the rooms that are being cleaned and painted. °· Wash your hands often with soap and water. If soap and water are not available, use hand sanitizer. °· Do not allow anyone to smoke in your home. °General instructions °· Take over-the-counter and prescription medicines only as told by your doctor. °? Talk with your doctor if you have questions about how or when to take your medicines. °? Make note if you need to use your medicines more often than usual. °· Do not use any products that contain nicotine or tobacco, such as cigarettes and e-cigarettes. If you need help quitting, ask your doctor. °· Stay away from secondhand smoke. °· Avoid doing things outdoors when allergen counts are high and when air quality is low. °· Wear a ski mask   when doing outdoor activities in the winter. The mask should cover your nose and mouth. Exercise indoors on cold days if you can.  Warm up before you exercise. Take time to cool down after exercise.  Use a peak flow meter as told by your doctor. A peak flow meter is a tool that measures how well the lungs are working.  Keep track of the peak flow meter's readings.  Write them down.  Follow your asthma action plan. This is a written plan for taking care of your asthma and treating your attacks.  Make sure you get all the shots (vaccines) that your doctor recommends. Ask your doctor about a flu shot and a pneumonia shot.  Keep all follow-up visits as told by your doctor. This is important. Contact a doctor if:  You have wheezing, shortness of breath, or a cough even while taking medicine to prevent attacks.  The mucus you cough up (sputum) is thicker than usual.  The mucus you cough up changes from clear or white to yellow, green, gray, or bloody.  You have problems from the medicine you are taking, such as: ? A rash. ? Itching. ? Swelling. ? Trouble breathing.  You need reliever medicines more than 2-3 times a week.  Your peak flow reading is still at 50-79% of your personal best after following the action plan for 1 hour.  You have a fever. Get help right away if:  You seem to be worse and are not responding to medicine during an asthma attack.  You are short of breath even at rest.  You get short of breath when doing very little activity.  You have trouble eating, drinking, or talking.  You have chest pain or tightness.  You have a fast heartbeat.  Your lips or fingernails start to turn blue.  You are light-headed or dizzy, or you faint.  Your peak flow is less than 50% of your personal best.  You feel too tired to breathe normally. Summary  Asthma is a long-term (chronic) condition in which the airways get tight and narrow. An asthma attack can make it hard to breathe.  Asthma cannot be cured, but medicines and lifestyle changes can help control it.  Make sure you understand how to avoid triggers and how and when to use your medicines. This information is not intended to replace advice given to you by your health care provider. Make sure you discuss any questions you have with your health care provider. Document Revised:  09/24/2018 Document Reviewed: 08/26/2016 Elsevier Patient Education  2020 [employer]   Asthma Attack  Acute bronchospasm caused by asthma is also referred to as an asthma attack. Bronchospasm means that the air passages become narrowed or "tight," which limits the amount of oxygen that can get into the lungs. The narrowing is caused by inflammation and tightening of the muscles in the air tubes (bronchi) in the lungs. Excessive mucus is also produced, which narrows the airways more. This can cause trouble breathing, coughing, and loud breathing (wheezing). What are the causes? Possible triggers include:  Animal dander from the skin, hair, or feathers of animals.  Dust mites contained in house dust.  Cockroaches.  Pollen from trees or grass.  Mold.  Cigarette or tobacco smoke.  Air pollutants such as dust, household cleaners, hair sprays, aerosol sprays, paint fumes, strong chemicals, or strong odors.  Cold air or weather changes. Cold air may trigger inflammation. Winds increase molds and pollens in the air.  Strong emotions  such as crying or laughing hard.  Stress.  Certain medicines, such as aspirin or beta-blockers.  Sulfites in foods and drinks, such as dried fruits and wine.  Infections or inflammatory conditions, such as a flu, a cold, pneumonia, or inflammation of the nasal membranes (rhinitis).  Gastroesophageal reflux disease (GERD). GERD is a condition in which stomach acid backs up into your esophagus, which can irritate nearby airway structures.  Exercise or activity that requires a lot of energy. What are the signs or symptoms? Symptoms of this condition include:  Wheezing. This may sound like whistling while breathing. This may be more noticeable at night.  Excessive coughing, particularly at night.  Chest tightness or pain.  Shortness of breath.  Feeling like you cannot get enough air no matter how hard you try (air hunger). How is this  diagnosed? This condition may be diagnosed based on:  Your medical history.  Your symptoms.  A physical exam.  Tests to check for other causes of your symptoms or other conditions that may have triggered your asthma attack. These tests may include: ? Chest X-ray. ? Blood tests. ? Specialized tests to assess lung function, such as breathing into a device that measures how much air you inhale and exhale (spirometry). How is this treated? The goal of treatment is to open the airways in your lungs and reduce inflammation. Most asthma attacks are treated with medicines that you inhale through a hand-held inhaler (metered dose inhaler, MDI) or a device that turns liquid medicine into a mist that you inhale (nebulizer). Medicines may include:  Quick relief or rescue medicines that relax the muscles of the bronchi. These medicines include bronchodilators, such as albuterol.  Controller medicines, such as inhaled corticosteroids. These are long-acting medicines that are used for daily asthma maintenance. If you have a moderate or severe asthma attack, you may be treated with steroid medicines by mouth or through an IV injection at the hospital. Steroid medicines reduce inflammation in your lungs. Depending on the severity of your attack, you may need oxygen therapy to help you breathe. If your asthma attack was caused by a bacterial infection, such as pneumonia, you will be given antibiotic medicines. Follow these instructions at home: Medicines  Take over-the-counter and prescription medicines only as told by your health care provider. Keep your medicines up-to-date and available.  If you are more than [redacted] weeks pregnant and you are prescribed any new medicines, tell your obstetrician about those medicines.  If you were prescribed an antibiotic medicine, take it as told by your health care provider. Do not stop taking the antibiotic even if you start to feel better. Avoiding triggers   Keep  track of things that trigger your asthma attacks or cause you to have breathing problems, and avoid exposure to these triggers.  Do not use any products that contain nicotine or tobacco, such as cigarettes and e-cigarettes. If you need help quitting, ask your health care provider.  Avoid secondhand smoke.  Avoid strong smells, such as perfumes, aerosols, and cleaning solvents.  When pollen or air pollution is bad, keep windows closed and use an air conditioner or go to places with air conditioning. Asthma action plan  Work with your health care provider to make a written plan for managing and treating your asthma attacks (asthma action plan). This plan should include: ? A list of your asthma triggers and how to avoid them. ? Information about when your medicines should be taken and when their dosage should be  changed. ? Instructions about using a device called a peak flow meter to monitor your condition. A peak flow meter measures how well your lungs are working and measures how severe your asthma is at a given time. Your "personal best" is the highest peak flow rate you can reach when you feel good and have no asthma symptoms. General instructions  Avoid excessive exercise or activity until your asthma attack resolves. Ask your health care provider what activities are safe for you and when you can return to your normal activities.  Stay up to date on all vaccinations recommended by your health care provider, such as flu and pneumonia vaccines.  Drink enough fluid to keep your urine clear or pale yellow. Staying hydrated helps keep mucus in your lungs thin so it can be coughed up easily.  If you drink caffeine, do so in moderation.  Do not use alcohol until you have recovered.  Keep all follow-up visits as told by your health care provider. This is important. Asthma requires careful medical care, and you and your health care provider can work together to reduce the likelihood of future  attacks. Contact a health care provider if:  Your peak flow reading is still at 50-79% of your personal best after you have followed your action plan for 1 hour. This is in the yellow zone, which means "caution."  You need to use a reliever medicine more than 2-3 times a week.  Your medicines are causing side effects, such as: ? Rash. ? Itching. ? Swelling. ? Trouble breathing.  Your symptoms do not improve after 48 hours.  You cough up mucus (sputum) that is thicker than usual.  You have a fever.  You need to use your medicines much more frequently than normal. Get help right away if:  Your peak flow reading is less than 50% of your personal best. This is in the red zone, which means "danger."  You have severe trouble breathing.  You develop chest pain or discomfort.  Your medicines no longer seem to be helping.  You vomit.  You cannot eat or drink without vomiting.  You are coughing up yellow, green, brown, or bloody mucus.  You have a fever and your symptoms suddenly get worse.  You have trouble swallowing.  You feel very tired, and breathing becomes tiring. Summary  Acute bronchospasm caused by asthma is also referred to as an asthma attack.  Bronchospasm is caused by narrowing or tightness in air passages, which causes shortness of breath, coughing, and loud breathing (wheezing).  Many things can trigger an asthma attack, such as allergens, weather changes, exercise, smoke, and other fumes.  Treatment for an asthma attack may include inhaled rescue medicines for immediate relief, as well as the use of maintenance therapy.  Get help right away if you have worsening shortness of breath, chest pain, or fever, or if your home medicines are no longer helping with your symptoms. This information is not intended to replace advice given to you by your health care provider. Make sure you discuss any questions you have with your health care provider. Document Revised:  11/10/2018 Document Reviewed: 08/23/2016 Elsevier Patient Education  [place].   Asthma and Physical Activity Physical activity is an important part of a healthy lifestyle. If you have asthma, it is important to exercise because physical activity can help you to:  Control your asthma.  Maintain your weight or lose weight.  Increase your energy.  Decrease stress and anxiety.  Lower  your risk of getting sick.  Improve your heart health. However, asthma symptoms can flare up when you are physically active or exercising. You can learn how to control your asthma and prevent symptoms during exercise. This will help you to remain physically active. How can asthma affect my ability to be physically active? When you have asthma, physical activity can cause you to have symptoms such as:  Wheezing. This may sound like whistling while breathing.  A feeling of tightness in the chest.  Sore throat.  Coughing.  Shortness of breath.  Tiredness (fatigue) with minimal activity.  Increased sputum production.  Chest pain. What actions can I take to prevent asthma problems during physical activity? Pulmonary rehabilitation Enroll in a pulmonary rehabilitation program. Benefits of this type of program include:  Education on lung diseases.  Classes that teach you how to exercise and be more active while decreasing your shortness of breath.  A group setting that allows you to talk with others who have asthma. Asthma action plan Follow the asthma action plan set by your health care provider. Your personal asthma plan may include:  Taking your medicines as told by your health care provider.  Avoiding your asthma triggers, except physical activity. Triggers may include cold air, dust, pollen, pet dander, and air pollution.  Tracking your asthma control.  Using a peak flow meter.  Being aware of worsening symptoms.  Knowing when to seek emergency care. Proper  breathing During exercise, follow these tips for proper breathing:  Breathe in before starting the exercise and breathe out during the hardest part of the exercise.  Take slow breaths.  Pace yourself and do not try to go too fast.  While breathing out, purse your lips. Before beginning any exercise program or new activity, talk with your health care provider. Medicines If physical activity triggers your asthma, your health care provider may order the following medicines:  A rescue inhaler (short-acting beta2-agonist) for you to use shortly before physical activity or exercise. Its effects may reduce exercise-related symptoms for 2-3 hours.  A long-acting beta2-agonist that can offer up to 12 hours of relief if taken daily.  Leukotriene modifiers. These pills are taken several hours before physical activity or exercise to help prevent asthma symptoms that are caused by exercise.  Long-term control medicines. These will be given if you have severe or frequent asthma symptoms during or after exercise. These symptoms may also mean that your asthma is not well controlled.  General information  Exercise indoors when the air is dry or during allergy season.  Try to breathe in warm, moist air by wearing a scarf over your nose and mouth or breathing only through your nose.  Spend a few minutes warming up before your workout.  Cool down after exercise. What should I do if my asthma symptoms get worse? Contact your health care provider if your asthma symptoms are getting worse. Your asthma is getting worse if:  You have symptoms more often.  Your symptoms are more severe.  Your symptoms get worse at night and make you lose sleep.  Your peak flow number is lower than your personal best or changes a lot from day to day.  Your asthma medicines do not work as well as they used to.  You use your rescue inhaler more often. If you use your rescue inhaler more than 2 days a week, your asthma  is not well controlled.  You go to the emergency room or see your health care provider  because of an asthma attack. Where to find support  Ask your health care provider about signing up for a pulmonary rehabilitation program.  Ask your health care provider about asthma support groups.  Visit your local community health department.  Check out local hospitals' community health programs. Where can I get more information?  Your health care provider.  American Lung Association: lung.org  National Heart, Lung, and Blood Institute: https://www.[place].biz/ Contact a health care provider if:  You have trouble walking and talking because you are out of breath. Get help right away if:  Your lips or fingernails are blue.  You are not able to breathe or catch your breath. Summary  Physical activity is an important part of a healthy lifestyle. However, if you have asthma, your symptoms can flare up during exercise or physical activity.  You can prevent problems during physical activity by doing pulmonary rehabilitation, following an asthma action plan, doing proper breathing, and using medicines.  Talk with your health care provider before starting any exercise program or new activity. This information is not intended to replace advice given to you by your health care provider. Make sure you discuss any questions you have with your health care provider. Document Revised: 11/10/2018 Document Reviewed: 10/07/2017 Elsevier Patient Education  [place] Prevention, Adult Although you may not be able to control the fact that you have asthma, you can take actions to prevent episodes of asthma (asthma attacks). These actions include:  Creating a written plan for managing and treating your asthma attacks (asthma action plan).  Monitoring your asthma.  Avoiding things that can irritate your airways or make your asthma symptoms worse (asthma triggers).  Taking your medicines as  directed.  Acting quickly if you have signs or symptoms of an asthma attack. What are some ways to prevent an asthma attack? Create a plan Work with your health care provider to create an asthma action plan. This plan should include:  A list of your asthma triggers and how to avoid them.  A list of symptoms that you experience during an asthma attack.  Information about when to take medicine and how much medicine to take.  Information to help you understand your peak flow measurements.  Contact information for your health care providers.  Daily actions that you can take to control asthma. Monitor your asthma To monitor your asthma:  Use your peak flow meter every morning and every evening for 2-3 weeks. Record the results in a journal. A drop in your peak flow numbers on one or more days may mean that you are starting to have an asthma attack, even if you are not having symptoms.  When you have asthma symptoms, write them down in a journal.  Avoid asthma triggers Work with your health care provider to find out what your asthma triggers are. This can be done by:  Being tested for allergies.  Keeping a journal that notes when asthma attacks occur and what may have contributed to them.  Asking your health care provider whether other medical conditions make your asthma worse. Common asthma triggers include:  Dust.  Smoke. This includes campfire smoke and secondhand smoke from tobacco products.  Pet dander.  Trees, grasses or pollens.  Very cold, dry, or humid air.  Mold.  Foods that contain high amounts of sulfites.  Strong smells.  Engine exhaust and air pollution.  Aerosol sprays and fumes from household cleaners.  Household pests and their droppings, including dust mites  and cockroaches.  Certain medicines, including NSAIDs. Once you have determined your asthma triggers, take steps to avoid them. Depending on your triggers, you may be able to reduce the chance  of an asthma attack by:  Keeping your home clean. Have someone dust and vacuum your home for you 1 or 2 times a week. If possible, have them use a high-efficiency particulate arrestance (HEPA) vacuum.  Washing your sheets weekly in hot water.  Using allergy-proof mattress covers and casings on your bed.  Keeping pets out of your home.  Taking care of mold and water problems in your home.  Avoiding areas where people smoke.  Avoiding using strong perfumes or odor sprays.  Avoid spending a lot of time outdoors when pollen counts are high and on very windy days.  Talking with your health care provider before stopping or starting any new medicines. Medicines Take over-the-counter and prescription medicines only as told by your health care provider. Many asthma attacks can be prevented by carefully following your medicine schedule. Taking your medicines correctly is especially important when you cannot avoid certain asthma triggers. Even if you are doing well, do not stop taking your medicine and do not take less medicine. Act quickly If an asthma attack happens, acting quickly can decrease how severe it is and how long it lasts. Take these actions:  Pay attention to your symptoms. If you are coughing, wheezing, or having difficulty breathing, do not wait to see if your symptoms go away on their own. Follow your asthma action plan.  If you have followed your asthma action plan and your symptoms are not improving, call your health care provider or seek immediate medical care at the nearest hospital. It is important to write down how often you need to use your fast-acting rescue inhaler. You can track how often you use an inhaler in your journal. If you are using your rescue inhaler more often, it may mean that your asthma is not under control. Adjusting your asthma treatment plan may help you to prevent future asthma attacks and help you to gain better control of your condition. How can I  prevent an asthma attack when I exercise? Exercise is a common asthma trigger. To prevent asthma attacks during exercise:  Follow advice from your health care provider about whether you should use your fast-acting inhaler before exercising. Many people with asthma experience exercise-induced bronchoconstriction (EIB). This condition often worsens during vigorous exercise in cold, humid, or dry environments. Usually, people with EIB can stay very active by using a fast-acting inhaler before exercising.  Avoid exercising outdoors in very cold or humid weather.  Avoid exercising outdoors when pollen counts are high.  Warm up and cool down when exercising.  Stop exercising right away if asthma symptoms start. Consider taking part in exercises that are less likely to cause asthma symptoms such as:  Indoor swimming.  Biking.  Walking.  Hiking.  Playing football. This information is not intended to replace advice given to you by your health care provider. Make sure you discuss any questions you have with your health care provider. Document Revised: 07/04/2017 Document Reviewed: 01/06/2016 Elsevier Patient Education  2020 [employer] Inc[employer]   Asthma Action Plan, Adult An asthma action plan helps you understand how to manage your asthma and what to do when you have an asthma attack. The action plan is a color-coded plan that lists the symptoms that indicate whether or not your condition is under control and what actions to take.  If you have symptoms in the green zone, it means you are doing well.  If you have symptoms in the yellow zone, it means you are having problems.  If you have symptoms in the red zone, you need medical care right away. Follow the plan that you and your health care provider develop. Review your plan with your health care provider at each visit. What triggers your asthma? Knowing the things that can trigger an asthma attack or make your asthma symptoms worse is very  important. Talk to your health care provider about your asthma triggers and how to avoid them. Record your known asthma triggers here: _______________ What is your personal best peak flow reading? If you use a peak flow meter, determine your personal best reading. Record it here: _______________ Red zone Symptoms in this zone mean that you should get medical help right away. You will likely feel distressed and have symptoms at rest that restrict your activity. You are in the red zone if:  You are breathing hard and quickly.  Your nose opens wide, your ribs show, and your neck muscles become visible when you breathe in.  Your lips, fingers, or toes are a bluish color.  You have trouble speaking in full sentences.  Your peak flow reading is less than __________ (less than 50% of your personal best).  Your symptoms do not improve within 15-20 minutes after you use your reliever or rescue medicine (bronchodilator). If you have any of these symptoms:  Call your local emergency services (911 in the [place] or go to the nearest emergency room.  Use your reliever or rescue medicine. ? Start a nebulizer treatment or take 2-4 puffs from a metered-dose inhaler with a spacer. ? Repeat this action every 15-20 minutes until help arrives. Yellow zone Symptoms in this zone mean that your condition may be getting worse. You may have symptoms that interfere with exercise, are noticeably worse after exposure to triggers, or are worse at the first sign of a cold (upper respiratory infection). These may include:  Waking from sleep.  Coughing, especially at night or first thing in the morning.  Mild wheezing.  Chest tightness.  A peak flow reading that is __________ to __________ (50-79% of your personal best). If you have any of these symptoms:  Add the following medicine to the ones that you use daily: ? Reliever or rescue medicine and dosage: _______________ ? Additional medicine and dosage:  _______________ Call your health care provider if:  You remain in the yellow zone for __________ hours.  You are using a reliever or rescue medicine more than 2-3 times a week. Green zone This zone means that your asthma is under control. You may not have any symptoms while you are in the green zone. This means that you:  Have no coughing or wheezing, even while you are working or playing.  Sleep through the night.  Are breathing well.  Have a peak flow reading that is above __________ (80% of your personal best or greater). If you are in the green zone, continue to manage your asthma as directed:  Take these medicines every day: ? Controller medicine and dosage: _______________ ? Controller medicine and dosage: _______________ ? Controller medicine and dosage: _______________ ? Controller medicine and dosage: _______________  Before exercise, use this reliever or rescue medicine: _______________ Call your health care provider if you are using a reliever or rescue medicine more than 2-3 times a week. Where to find more information You can find  more information about asthma from:  Centers for Disease Control and Prevention: SendThoughts.com.pt  American Lung Association: www.lung.org This information is not intended to replace advice given to you by your health care provider. Make sure you discuss any questions you have with your health care provider. Document Revised: 05/05/2018 Document Reviewed: 04/02/2017 Elsevier Patient Education  2020 [employer].

## 2020-08-08 ENCOUNTER — Other Ambulatory Visit: Payer: Self-pay

## 2020-08-16 ENCOUNTER — Ambulatory Visit: Payer: Self-pay | Admitting: Primary Care

## 2020-09-22 ENCOUNTER — Encounter: Payer: Self-pay | Admitting: Emergency Medicine

## 2020-09-22 ENCOUNTER — Ambulatory Visit
Admission: EM | Admit: 2020-09-22 | Discharge: 2020-09-22 | Disposition: A | Discharge status: Home / Self Care | Payer: Self-pay | Attending: Physician Assistant | Admitting: Physician Assistant

## 2020-09-22 ENCOUNTER — Other Ambulatory Visit: Payer: Self-pay

## 2020-09-22 DIAGNOSIS — Z881 Allergy status to other antibiotic agents status: Secondary | ICD-10-CM | POA: Insufficient documentation

## 2020-09-22 DIAGNOSIS — R0981 Nasal congestion: Secondary | ICD-10-CM

## 2020-09-22 DIAGNOSIS — Z880 Allergy status to penicillin: Secondary | ICD-10-CM | POA: Insufficient documentation

## 2020-09-22 DIAGNOSIS — Z9079 Acquired absence of other genital organ(s): Secondary | ICD-10-CM | POA: Insufficient documentation

## 2020-09-22 DIAGNOSIS — Z882 Allergy status to sulfonamides status: Secondary | ICD-10-CM | POA: Insufficient documentation

## 2020-09-22 DIAGNOSIS — H9203 Otalgia, bilateral: Secondary | ICD-10-CM

## 2020-09-22 DIAGNOSIS — Z886 Allergy status to analgesic agent status: Secondary | ICD-10-CM | POA: Insufficient documentation

## 2020-09-22 DIAGNOSIS — Z794 Long term (current) use of insulin: Secondary | ICD-10-CM | POA: Insufficient documentation

## 2020-09-22 DIAGNOSIS — I10 Essential (primary) hypertension: Secondary | ICD-10-CM | POA: Insufficient documentation

## 2020-09-22 DIAGNOSIS — J069 Acute upper respiratory infection, unspecified: Secondary | ICD-10-CM

## 2020-09-22 DIAGNOSIS — Z20822 Contact with and (suspected) exposure to covid-19: Secondary | ICD-10-CM | POA: Insufficient documentation

## 2020-09-22 DIAGNOSIS — J029 Acute pharyngitis, unspecified: Secondary | ICD-10-CM

## 2020-09-22 DIAGNOSIS — J45909 Unspecified asthma, uncomplicated: Secondary | ICD-10-CM | POA: Insufficient documentation

## 2020-09-22 DIAGNOSIS — E785 Hyperlipidemia, unspecified: Secondary | ICD-10-CM | POA: Insufficient documentation

## 2020-09-22 DIAGNOSIS — Z9049 Acquired absence of other specified parts of digestive tract: Secondary | ICD-10-CM | POA: Insufficient documentation

## 2020-09-22 DIAGNOSIS — Z79899 Other long term (current) drug therapy: Secondary | ICD-10-CM | POA: Insufficient documentation

## 2020-09-22 DIAGNOSIS — E119 Type 2 diabetes mellitus without complications: Secondary | ICD-10-CM | POA: Insufficient documentation

## 2020-09-22 LAB — RAPID INFLUENZA A&B ANTIGENS
Influenza A (ARMC): NEGATIVE
Influenza B (ARMC): NEGATIVE

## 2020-09-22 LAB — SARS CORONAVIRUS 2 (TAT 6-24 HRS): SARS Coronavirus 2: NEGATIVE

## 2020-09-22 LAB — GROUP A STREP BY PCR: Group A Strep by PCR: NOT DETECTED

## 2020-09-22 MED ORDER — IPRATROPIUM BROMIDE 0.06 % NA SOLN: 2.0000 | Freq: Four times a day (QID) | NASAL | 12 refills | Status: DC

## 2020-09-22 NOTE — ED Triage Notes (Signed)
Patient c/o sore throat, HAs, and bilateral ear pain that started yesterday.  Patient denies fevers.

## 2020-09-22 NOTE — ED Provider Notes (Signed)
[facility]    CSN: [id number] Arrival date & time: 09/22/20  1041      History   Chief Complaint Chief Complaint  Patient presents with  . Otalgia  . Sore Throat  . Headache    HPI Joan [name] is a 40 y.o. female presenting for onset of fatigue, body aches, sore throat, nasal congestion, headaches and bilateral ear pain/pressure last night.  Also admits to very mild cough.  Patient denies any fever, sinus pain, chest pain, breathing difficulty/wheezing, abdominal pain, N/V/D, or changes in smell or taste.  Patient denies any known sick contacts.  She does work in hospice.  No known exposure to COVID-19.  She is fully vaccinated for COVID-19.  She has been taking over-the-counter Tylenol for symptoms.  Patient states that she has elevated blood pressure in and OTC decongestants raise her blood pressure so she does not take them.  Her past medical history is significant for asthma, diabetes, hypertension, and hyperlipidemia.  She has no other complaints or concerns today.  HPI  Past Medical History:  Diagnosis Date  . Acute asthma exacerbation 04/10/2018  . Acute non-recurrent maxillary sinusitis 09/25/2018  . Asthma   . Auditory hallucinations    only after anesthesia  . BV (bacterial vaginosis) 02/13/2018  . Diabetes mellitus without complication (HCC)    diet controlled  . Diabetes mellitus, type II (HCC)    insulin, jardiance  . Dyspnea    with exertion  . Essential hypertension   . Essential hypertension 11/24/2017  . GERD (gastroesophageal reflux disease)    occasionally-NO MEDS  . History of placement of ear tubes 05/20/2018  . Hyperlipidemia   . MDD (major depressive disorder)   . Miscarriage   . Nausea & vomiting 10/05/2019  . PTSD (post-traumatic stress disorder)   . Right lower quadrant abdominal pain 02/13/2018  . Tachycardia     Patient Active Problem List   Diagnosis Date Noted  . Asthma exacerbation 08/03/2020  . Acute URI 07/26/2020  .  COVID-19 virus infection 08/16/2019  . Kidney stone 02/11/2019  . Asthma 11/03/2018  . Recurrent sinusitis 05/20/2018  . Recurrent fever of unknown etiology 05/08/2018  . Recurrent URI (upper respiratory infection) 05/08/2018  . Tachycardia 05/04/2018  . Missed period 04/29/2018  . Diabetes mellitus type 2, uncontrolled ([place]) 11/24/2017  . Essential hypertension 11/24/2017  . Hyperlipidemia 11/24/2017  . Major depressive disorder, recurrent, severe without psychotic features ([place])   . PTSD (post-traumatic stress disorder) 04/11/2015    Past Surgical History:  Procedure Laterality Date  . ABDOMINAL HYSTERECTOMY    . ADENOIDECTOMY    . CHOLECYSTECTOMY  2009  . CYSTOSCOPY N/A 07/31/2018   Procedure: CYSTOSCOPY;  Surgeon: Benjaman Kindler, MD;  Location: [facility];  Service: Gynecology;  Laterality: N/A;  . LAPAROSCOPIC BILATERAL SALPINGECTOMY Bilateral 07/31/2018   Procedure: LAPAROSCOPIC BILATERAL SALPINGECTOMY;  Surgeon: Benjaman Kindler, MD;  Location: [facility];  Service: Gynecology;  Laterality: Bilateral;  . LAPAROSCOPIC HYSTERECTOMY N/A 07/31/2018   Procedure: HYSTERECTOMY TOTAL LAPAROSCOPIC;  Surgeon: Benjaman Kindler, MD;  Location: [facility];  Service: Gynecology;  Laterality: N/A;  . LAPAROSCOPY N/A 06/07/2019   Procedure: LAPAROSCOPY OPERATIVE, WITH PERITONEAL BIOPSIES;  Surgeon: Benjaman Kindler, MD;  Location: [facility];  Service: Gynecology;  Laterality: N/A;  . LYSIS OF ADHESION N/A 07/31/2018   Procedure: LYSIS OF ADHESION;  Surgeon: Benjaman Kindler, MD;  Location: [facility];  Service: Gynecology;  Laterality: N/A;  . OVARY SURGERY Right    cyst removed a while  ago  . TONSILLECTOMY    . tubes in ear      OB History   No obstetric history on file.      Home Medications    Prior to Admission medications   Medication Sig Start Date End Date Taking? Authorizing Provider  albuterol (VENTOLIN HFA) 108 (90 Base) MCG/ACT inhaler INHALE 2 PUFFS BY MOUTH EVERY 6 HOURS AS  NEEDED FOR SHORTNESS OF BREATH AND WHEEZING Patient taking differently: Inhale 2 puffs into the lungs every 6 (six) hours as needed for wheezing or shortness of breath. INHALE 2 PUFFS BY MOUTH EVERY 6 HOURS AS NEEDED FOR SHORTNESS OF BREATH AND WHEEZING 10/19/18  Yes Pleas Koch, NP  blood glucose meter kit and supplies KIT Dispense based on patient and insurance preference. Use up three times daily as directed. (FOR ICD-9 250.00, 250.01). 11/24/17  Yes Pleas Koch, NP  brompheniramine-pseudoephedrine-DM 30-2-10 MG/5ML syrup Take 5 mLs by mouth 4 (four) times daily as needed. 08/01/20  Yes Johnn Hai, PA-C  citalopram (CELEXA) 20 MG tablet Take 1 tablet (20 mg total) by mouth daily. For anxiety. 11/09/19  Yes Pleas Koch, NP  insulin aspart protamine- aspart (NOVOLOG MIX 70/30) (70-30) 100 UNIT/ML injection Inject 0.28 mLs (28 Units total) into the skin 2 (two) times daily with a meal. 08/06/20 09/05/20 Yes Kc, Maren Beach, MD  ipratropium (ATROVENT) 0.06 % nasal spray Place 2 sprays into both nostrils 4 (four) times daily. 09/22/20  Yes Laurene Footman B, PA-C  losartan (COZAAR) 25 MG tablet Take 1 tablet (25 mg total) by mouth daily. For blood pressure. 02/08/20  Yes Pleas Koch, NP  Insulin Pen Needle (PEN NEEDLES) 31G X 6 MM MISC Use with insulin as directed. 02/02/20   Pleas Koch, NP  montelukast (SINGULAIR) 10 MG tablet Take 10 mg by mouth at bedtime.  Patient not taking: No sig reported    [provider]  rosuvastatin (CRESTOR) 10 MG tablet Take 1 tablet (10 mg total) by mouth every evening. For cholesterol. Patient not taking: No sig reported 09/06/19   Pleas Koch, NP    Family History Family History  Problem Relation Age of Onset  . Asthma Mother   . Diabetes Mother   . Hyperlipidemia Mother   . Hypertension Mother   . Diabetes Father   . Hyperlipidemia Father   . Hypertension Father   . Diabetes Brother   . Depression Brother   . Alcohol  abuse Brother   . Depression Maternal Grandmother   . Stroke Paternal Grandmother   . Breast cancer Neg Hx     Social History Social History   Tobacco Use  . Smoking status: Never Smoker  . Smokeless tobacco: Never Used  Vaping Use  . Vaping Use: Never used  Substance Use Topics  . Alcohol use: No  . Drug use: No     Allergies   Ibuprofen, Ciprofloxacin, Metformin and related, Penicillins, Sulfa antibiotics, Other, and Shellfish allergy   Review of Systems Review of Systems  Constitutional: Positive for fatigue. Negative for chills, diaphoresis and fever.  HENT: Positive for ear pain, rhinorrhea and sore throat. Negative for congestion, sinus pressure and sinus pain.   Respiratory: Positive for cough. Negative for shortness of breath.   Cardiovascular: Negative for chest pain.  Gastrointestinal: Negative for abdominal pain, nausea and vomiting.  Musculoskeletal: Positive for myalgias. Negative for arthralgias.  Skin: Negative for rash.  Neurological: Positive for headaches. Negative for weakness.  Hematological: Negative  for adenopathy.     Physical Exam Triage Vital Signs ED Triage Vitals  Enc Vitals Group     BP 09/22/20 1107 127/87     Pulse Rate 09/22/20 1107 (!) 111     Resp 09/22/20 1107 14     Temp 09/22/20 1107 97.9 F (36.6 C)     Temp Source 09/22/20 1107 Oral     SpO2 09/22/20 1107 98 %     Weight 09/22/20 1104 298 lb (135.2 kg)     Height 09/22/20 1104 '5\' 6"'  (1.676 m)     Head Circumference --      Peak Flow --      Pain Score 09/22/20 1104 9     Pain Loc --      Pain Edu? --      Excl. in [place]? --    No data found.  Updated Vital Signs BP 127/87 (BP Location: Left Arm)   Pulse (!) 111   Temp 97.9 F (36.6 C) (Oral)   Resp 14   Ht '5\' 6"'  (1.676 m)   Wt 298 lb (135.2 kg)   LMP 07/24/2018 (Exact Date) Comment: surgery 07/31/2018  SpO2 98%   BMI 48.10 kg/m      Physical Exam Vitals and nursing note reviewed.  Constitutional:       General: She is not in acute distress.    Appearance: Normal appearance. She is ill-appearing. She is not toxic-appearing or diaphoretic.  HENT:     Head: Normocephalic and atraumatic.     Right Ear: Ear canal and external ear normal. A middle ear effusion is present.     Left Ear: External ear normal. A middle ear effusion is present.     Nose: Congestion and rhinorrhea present.     Mouth/Throat:     Mouth: Mucous membranes are moist.     Pharynx: Oropharynx is clear. Posterior oropharyngeal erythema present.  Eyes:     General: No scleral icterus.       Right eye: No discharge.        Left eye: No discharge.     Conjunctiva/sclera: Conjunctivae normal.  Cardiovascular:     Rate and Rhythm: Regular rhythm. Tachycardia present.     Heart sounds: Normal heart sounds.  Pulmonary:     Effort: Pulmonary effort is normal. No respiratory distress.     Breath sounds: Normal breath sounds.  Musculoskeletal:     Cervical back: Neck supple.  Skin:    General: Skin is dry.  Neurological:     General: No focal deficit present.     Mental Status: She is alert. Mental status is at baseline.     Motor: No weakness.     Gait: Gait normal.  Psychiatric:        Mood and Affect: Mood normal.        Behavior: Behavior normal.        Thought Content: Thought content normal.      UC Treatments / Results  Labs (all labs ordered are listed, but only abnormal results are displayed) Labs Reviewed  GROUP A STREP BY PCR  RAPID INFLUENZA A&B ANTIGENS  SARS CORONAVIRUS 2 (TAT 6-24 HRS)    EKG   Radiology No results found.  Procedures Procedures (including critical care time)  Medications Ordered in UC Medications - No data to display  Initial Impression / Assessment and Plan / UC Course  I have reviewed the triage vital signs and the nursing notes.  Pertinent labs &  imaging results that were available during my care of the patient were reviewed by me and considered in my medical  decision making (see chart for details).   40 year old female presenting for fatigue, body aches, congestion, sore throat and ear pain since last night.  Exam is significant for bilateral effusions of the TMs, nasal congestion rhinorrhea, and mild posterior pharyngeal erythema with postnasal drainage.  Chest clear to auscultation and tachycardic.  Molecular strep negative.  Flu testing negative.  Advised patient she likely has a viral infection, possibly COVID-19 given recent surge in cases.  Advised of CDC guidelines, isolation protocol and ED precautions.  Advised supportive care with increasing rest and fluids.  Suggested OTC Coricidin HBP and I prescribed Atrovent nasal spray.  Encouraged to continue Tylenol as needed headaches.  Return and ED precautions reviewed with patient.  Final Clinical Impressions(s) / UC Diagnoses   Final diagnoses:  Viral upper respiratory tract infection  Sore throat  Nasal congestion  Otalgia of both ears     Discharge Instructions     URI/COLD SYMPTOMS: Your exam today is consistent with a viral illness. Antibiotics are not indicated at this time. Use medications as directed, including cough syrup (Use Coricidin HBP), nasal saline, and decongestants. Your symptoms should improve over the next few days and resolve within 7-10 days. Increase rest and fluids. F/u if symptoms worsen or predominate such as sore throat, ear pain, productive cough, shortness of breath, or if you develop high fevers or worsening fatigue over the next several days.    You have received COVID testing today either for positive exposure, concerning symptoms that could be related to COVID infection, screening purposes, or re-testing after confirmed positive.  Your test obtained today checks for active viral infection in the last 1-2 weeks. If your test is negative now, you can still test positive later. So, if you do develop symptoms you should either get re-tested and/or isolate x 5 days  and then strict mask use x 5 days (unvaccinated) or mask use x 10 days (vaccinated). Please follow CDC guidelines.  While Rapid antigen tests come back in 15-20 minutes, send out PCR/molecular test results typically come back within 1-3 days. In the mean time, if you are symptomatic, assume this could be a positive test and treat/monitor yourself as if you do have COVID.   We will call with test results if positive. Please download the MyChart app and set up a profile to access test results.   If symptomatic, go home and rest. Push fluids. Take Tylenol as needed for discomfort. Gargle warm salt water. Throat lozenges. Take Mucinex DM or Robitussin for cough. Humidifier in bedroom to ease coughing. Warm showers. Also review the COVID handout for more information.  COVID-19 INFECTION: The incubation period of COVID-19 is approximately 14 days after exposure, with most symptoms developing in roughly 4-5 days. Symptoms may range in severity from mild to critically severe. Roughly 80% of those infected will have mild symptoms. People of any age may become infected with COVID-19 and have the ability to transmit the virus. The most common symptoms include: fever, fatigue, cough, body aches, headaches, sore throat, nasal congestion, shortness of breath, nausea, vomiting, diarrhea, changes in smell and/or taste.    COURSE OF ILLNESS Some patients may begin with mild disease which can progress quickly into critical symptoms. If your symptoms are worsening please call ahead to the Emergency Department and proceed there for further treatment. Recovery time appears to be roughly 1-2 weeks for  mild symptoms and 3-6 weeks for severe disease.   GO IMMEDIATELY TO ER FOR FEVER YOU ARE UNABLE TO GET DOWN WITH TYLENOL, BREATHING PROBLEMS, CHEST PAIN, FATIGUE, LETHARGY, INABILITY TO EAT OR DRINK, ETC  QUARANTINE AND ISOLATION: To help decrease the spread of COVID-19 please remain isolated if you have COVID infection or are  highly suspected to have COVID infection. This means -stay home and isolate to one room in the home if you live with others. Do not share a bed or bathroom with others while ill, sanitize and wipe down all countertops and keep common areas clean and disinfected. Stay home for 5 days. If you have no symptoms or your symptoms are resolving after 5 days, you can leave your house. Continue to wear a mask around others for 5 additional days. If you have been in close contact (within 6 feet) of someone diagnosed with COVID 19, you are advised to quarantine in your home for 14 days as symptoms can develop anywhere from 2-14 days after exposure to the virus. If you develop symptoms, you  must isolate.  Most current guidelines for COVID after exposure -unvaccinated: isolate 5 days and strict mask use x 5 days. Test on day 5 is possible -vaccinated: wear mask x 10 days if symptoms do not develop -You do not necessarily need to be tested for COVID if you have + exposure and  develop symptoms. Just isolate at home x10 days from symptom onset During this global pandemic, CDC advises to practice social distancing, try to stay at least 18f away from others at all times. Wear a face covering. Wash and sanitize your hands regularly and avoid going anywhere that is not necessary.  KEEP IN MIND THAT THE COVID TEST IS NOT 100% ACCURATE AND YOU SHOULD STILL DO EVERYTHING TO PREVENT POTENTIAL SPREAD OF VIRUS TO OTHERS (WEAR MASK, WEAR GLOVES, [place]S AND SANITIZE REGULARLY). IF INITIAL TEST IS NEGATIVE, THIS MAY NOT MEAN YOU ARE DEFINITELY NEGATIVE. MOST ACCURATE TESTING IS DONE 5-7 DAYS AFTER EXPOSURE.   It is not advised by CDC to get re-tested after receiving a positive COVID test since you can still test positive for weeks to months after you have already cleared the virus.   *If you have not been vaccinated for COVID, I strongly suggest you consider getting vaccinated as long as there are no contraindications.       ED Prescriptions    Medication Sig Dispense Auth. Provider   ipratropium (ATROVENT) 0.06 % nasal spray Place 2 sprays into both nostrils 4 (four) times daily. 15 mL EDanton Clap PA-C     PDMP not reviewed this encounter.   EDanton Clap PA-C 09/22/20 1228

## 2020-09-22 NOTE — Discharge Instructions (Addendum)
URI/COLD SYMPTOMS: Your exam today is consistent with a viral illness. Antibiotics are not indicated at this time. Use medications as directed, including cough syrup (Use Coricidin HBP), nasal saline, and decongestants. Your symptoms should improve over the next few days and resolve within 7-10 days. Increase rest and fluids. F/u if symptoms worsen or predominate such as sore throat, ear pain, productive cough, shortness of breath, or if you develop high fevers or worsening fatigue over the next several days.    You have received COVID testing today either for positive exposure, concerning symptoms that could be related to COVID infection, screening purposes, or re-testing after confirmed positive.  Your test obtained today checks for active viral infection in the last 1-2 weeks. If your test is negative now, you can still test positive later. So, if you do develop symptoms you should either get re-tested and/or isolate x 5 days and then strict mask use x 5 days (unvaccinated) or mask use x 10 days (vaccinated). Please follow CDC guidelines.  While Rapid antigen tests come back in 15-20 minutes, send out PCR/molecular test results typically come back within 1-3 days. In the mean time, if you are symptomatic, assume this could be a positive test and treat/monitor yourself as if you do have COVID.   We will call with test results if positive. Please download the MyChart app and set up a profile to access test results.   If symptomatic, go home and rest. Push fluids. Take Tylenol as needed for discomfort. Gargle warm salt water. Throat lozenges. Take Mucinex DM or Robitussin for cough. Humidifier in bedroom to ease coughing. Warm showers. Also review the COVID handout for more information.  COVID-19 INFECTION: The incubation period of COVID-19 is approximately 14 days after exposure, with most symptoms developing in roughly 4-5 days. Symptoms may range in severity from mild to critically severe. Roughly 80%  of those infected will have mild symptoms. People of any age may become infected with COVID-19 and have the ability to transmit the virus. The most common symptoms include: fever, fatigue, cough, body aches, headaches, sore throat, nasal congestion, shortness of breath, nausea, vomiting, diarrhea, changes in smell and/or taste.    COURSE OF ILLNESS Some patients may begin with mild disease which can progress quickly into critical symptoms. If your symptoms are worsening please call ahead to the Emergency Department and proceed there for further treatment. Recovery time appears to be roughly 1-2 weeks for mild symptoms and 3-6 weeks for severe disease.   GO IMMEDIATELY TO ER FOR FEVER YOU ARE UNABLE TO GET DOWN WITH TYLENOL, BREATHING PROBLEMS, CHEST PAIN, FATIGUE, LETHARGY, INABILITY TO EAT OR DRINK, ETC  QUARANTINE AND ISOLATION: To help decrease the spread of COVID-19 please remain isolated if you have COVID infection or are highly suspected to have COVID infection. This means -stay home and isolate to one room in the home if you live with others. Do not share a bed or bathroom with others while ill, sanitize and wipe down all countertops and keep common areas clean and disinfected. Stay home for 5 days. If you have no symptoms or your symptoms are resolving after 5 days, you can leave your house. Continue to wear a mask around others for 5 additional days. If you have been in close contact (within 6 feet) of someone diagnosed with COVID 19, you are advised to quarantine in your home for 14 days as symptoms can develop anywhere from 2-14 days after exposure to the virus. If you develop  symptoms, you  must isolate.  Most current guidelines for COVID after exposure -unvaccinated: isolate 5 days and strict mask use x 5 days. Test on day 5 is possible -vaccinated: wear mask x 10 days if symptoms do not develop -You do not necessarily need to be tested for COVID if you have + exposure and  develop symptoms.  Just isolate at home x10 days from symptom onset During this global pandemic, CDC advises to practice social distancing, try to stay at least 24ft away from others at all times. Wear a face covering. Wash and sanitize your hands regularly and avoid going anywhere that is not necessary.  KEEP IN MIND THAT THE COVID TEST IS NOT 100% ACCURATE AND YOU SHOULD STILL DO EVERYTHING TO PREVENT POTENTIAL SPREAD OF VIRUS TO OTHERS (WEAR MASK, WEAR GLOVES, WASH HANDS AND SANITIZE REGULARLY). IF INITIAL TEST IS NEGATIVE, THIS MAY NOT MEAN YOU ARE DEFINITELY NEGATIVE. MOST ACCURATE TESTING IS DONE 5-7 DAYS AFTER EXPOSURE.   It is not advised by CDC to get re-tested after receiving a positive COVID test since you can still test positive for weeks to months after you have already cleared the virus.   *If you have not been vaccinated for COVID, I strongly suggest you consider getting vaccinated as long as there are no contraindications.

## 2020-10-02 ENCOUNTER — Encounter: Payer: Self-pay | Admitting: Family Medicine

## 2020-10-02 ENCOUNTER — Telehealth: Payer: Self-pay | Admitting: Family Medicine

## 2020-10-02 DIAGNOSIS — R1110 Vomiting, unspecified: Secondary | ICD-10-CM | POA: Insufficient documentation

## 2020-10-02 DIAGNOSIS — R112 Nausea with vomiting, unspecified: Secondary | ICD-10-CM

## 2020-10-02 NOTE — Progress Notes (Signed)
Joan [name], Joan [name] are scheduled for a virtual visit with your provider today.    Just as we do with appointments in the office, we must obtain your consent to participate.  Your consent will be active for this visit and any virtual visit you may have with one of our providers in the next 365 days.    If you have a MyChart account, I can also send a copy of this consent to you electronically.  All virtual visits are billed to your insurance company just like a traditional visit in the office.  As this is a virtual visit, video technology does not allow for your provider to perform a traditional examination.  This may limit your provider's ability to fully assess your condition.  If your provider identifies any concerns that need to be evaluated in person or the need to arrange testing such as labs, EKG, etc, we will make arrangements to do so.    Although advances in technology are sophisticated, we cannot ensure that it will always work on either your end or our end.  If the connection with a video visit is poor, we may have to switch to a telephone visit.  With either a video or telephone visit, we are not always able to ensure that we have a secure connection.   I need to obtain your verbal consent now.   Are you willing to proceed with your visit today?   Joan [name] has provided verbal consent on 10/02/2020 for a virtual visit (video or telephone).   Freddy Finner, NP 10/02/2020  9:30 AM   Date:  10/02/2020   ID:  Joan [name], DOB [birth year], MRN [id number]  Patient Location: Home Provider Location: Home Office   Participants: Patient and Provider for Visit and Wrap up  Method of visit: Video  Location of Patient: Home Location of Provider: Home Office Consent was obtain for visit over the video. Services rendered by provider: Visit was performed via video  A video enabled telemedicine application was used and I verified that I am speaking with the correct person using two  identifiers.  PCP:  Doreene Nest, NP   Chief Complaint:  Exposure to GI virus + for symptoms   History of Present Illness:    Joan [name] is a 40 y.o. female with history as stated below. Presents video telehealth for an acute care visit secondary to exposure of a GI bug- which she thinks was norovirus at a skilled facility. She has vomited twice this morning- tried to eat and it gave her stomach pains and she vomited again after that. She denies diarrhea, fevers, chills, or other symptoms at this time. She called out of work and would like a note to make sure they know what is going on.   Past Medical, Surgical, Social History, Allergies, and Medications have been Reviewed.  Past Medical History:  Diagnosis Date  . Acute asthma exacerbation 04/10/2018  . Acute non-recurrent maxillary sinusitis 09/25/2018  . Asthma   . Auditory hallucinations    only after anesthesia  . BV (bacterial vaginosis) 02/13/2018  . Diabetes mellitus without complication (HCC)    diet controlled  . Diabetes mellitus, type II (HCC)    insulin, jardiance  . Dyspnea    with exertion  . Essential hypertension   . Essential hypertension 11/24/2017  . GERD (gastroesophageal reflux disease)    occasionally-NO MEDS  . History of placement of ear tubes 05/20/2018  . Hyperlipidemia   .  MDD (major depressive disorder)   . Miscarriage   . Nausea & vomiting 10/05/2019  . PTSD (post-traumatic stress disorder)   . Right lower quadrant abdominal pain 02/13/2018  . Tachycardia     No outpatient medications have been marked as taking for the 10/02/20 encounter (Video Visit) with Freddy Finner, NP.     Allergies:   Ibuprofen, Ciprofloxacin, Metformin and related, Penicillins, Sulfa antibiotics, Other, and Shellfish allergy   ROS See HPI for history of present illness.  Physical Exam Constitutional:      Appearance: Normal appearance.  HENT:     Head: Normocephalic and atraumatic.     Right Ear: External  ear normal.     Left Ear: External ear normal.     Nose: Nose normal.  Eyes:     Extraocular Movements: Extraocular movements intact.     Conjunctiva/sclera: Conjunctivae normal.  Musculoskeletal:        General: Normal range of motion.     Cervical back: Normal range of motion.  Neurological:     Mental Status: She is alert and oriented to person, place, and time.  Psychiatric:        Mood and Affect: Mood normal.        Behavior: Behavior normal.        Thought Content: Thought content normal.        Judgment: Judgment normal.               1. Non-intractable vomiting with nausea, unspecified vomiting type -appears to be self limiting -no OTC use- do not feel prescription medication is needed at this time -Bland/brat diet info is provided. -advised to noro precaution -Work note provided    Time:   Today, I have spent 10 minutes with the patient with telehealth technology discussing the above problems, reviewing the chart, previous notes, medications and orders.    Tests Ordered: No orders of the defined types were placed in this encounter.   Medication Changes: No orders of the defined types were placed in this encounter.    Disposition:  Follow up as needed  Signed, Freddy Finner, NP  10/02/2020 9:30 AM

## 2020-10-02 NOTE — Patient Instructions (Signed)
Gastritis, Adult    Gastritis is swelling (inflammation) of the stomach. Gastritis can develop quickly (acute). It can also develop slowly over time (chronic). It is important to get help for this condition. If you do not get help, your stomach can bleed, and you can get sores (ulcers) in your stomach.  What are the causes?  This condition may be caused by:  · Germs that get to your stomach.  · Drinking too much alcohol.  · Medicines you are taking.  · Too much acid in the stomach.  · A disease of the intestines or stomach.  · Stress.  · An allergic reaction.  · Crohn's disease.  · Some cancer treatments (radiation).  Sometimes the cause of this condition is not known.  What are the signs or symptoms?  Symptoms of this condition include:  · Pain in your stomach.  · A burning feeling in your stomach.  · Feeling sick to your stomach (nauseous).  · Throwing up (vomiting).  · Feeling too full after you eat.  · Weight loss.  · Bad breath.  · Throwing up blood.  · Blood in your poop (stool).  How is this diagnosed?  This condition may be diagnosed with:  · Your medical history and symptoms.  · A physical exam.  · Tests. These can include:  ? Blood tests.  ? Stool tests.  ? A procedure to look inside your stomach (upper endoscopy).  ? A test in which a sample of tissue is taken for testing (biopsy).  How is this treated?  Treatment for this condition depends on what caused it. You may be given:  · Antibiotic medicine, if your condition was caused by germs.  · H2 blockers and similar medicines, if your condition was caused by too much acid.  Follow these instructions at home:  Medicines  · Take over-the-counter and prescription medicines only as told by your doctor.  · If you were prescribed an antibiotic medicine, take it as told by your doctor. Do not stop taking it even if you start to feel better.  Eating and drinking    · Eat small meals often, instead of large meals.  · Avoid foods and drinks that make your symptoms  worse.  · Drink enough fluid to keep your pee (urine) pale yellow.  Alcohol use  · Do not drink alcohol if:  ? Your doctor tells you not to drink.  ? You are pregnant, may be pregnant, or are planning to become pregnant.  · If you drink alcohol:  ? Limit your use to:  § 0-1 drink a day for women.  § 0-2 drinks a day for men.  ? Be aware of how much alcohol is in your drink. In the [place] one drink equals one 12 oz bottle of beer (355 mL), one 5 oz glass of wine (148 mL), or one 1½ oz glass of hard liquor (44 mL).  General instructions  · Talk with your doctor about ways to manage stress. You can exercise or do deep breathing, meditation, or yoga.  · Do not smoke or use products that have nicotine or tobacco. If you need help quitting, ask your doctor.  · Keep all follow-up visits as told by your doctor. This is important.  Contact a doctor if:  · Your symptoms get worse.  · Your symptoms go away and then come back.  Get help right away if:  · You throw up blood or something that looks like coffee   grounds.  · You have black or dark red poop.  · You throw up any time you try to drink fluids.  · Your stomach pain gets worse.  · You have a fever.  · You do not feel better after one week.  Summary  · Gastritis is swelling (inflammation) of the stomach.  · You must get help for this condition. If you do not get help, your stomach can bleed, and you can get sores (ulcers).  · This condition is diagnosed with medical history, physical exam, or tests.  · You can be treated with medicines for germs or medicines to block too much acid in your stomach.  This information is not intended to replace advice given to you by your health care provider. Make sure you discuss any questions you have with your health care provider.  Document Revised: 12/09/2017 Document Reviewed: 12/09/2017  [employer] Patient Education © 2021 [employer]

## 2020-10-03 ENCOUNTER — Emergency Department: Payer: 59

## 2020-10-03 ENCOUNTER — Other Ambulatory Visit: Payer: Self-pay

## 2020-10-03 ENCOUNTER — Emergency Department
Admission: EM | Admit: 2020-10-03 | Discharge: 2020-10-03 | Disposition: A | Discharge status: Home / Self Care | Payer: 59 | Attending: Emergency Medicine | Admitting: Emergency Medicine

## 2020-10-03 DIAGNOSIS — R509 Fever, unspecified: Secondary | ICD-10-CM | POA: Diagnosis not present

## 2020-10-03 DIAGNOSIS — I10 Essential (primary) hypertension: Secondary | ICD-10-CM | POA: Diagnosis not present

## 2020-10-03 DIAGNOSIS — J45909 Unspecified asthma, uncomplicated: Secondary | ICD-10-CM | POA: Diagnosis not present

## 2020-10-03 DIAGNOSIS — Z79899 Other long term (current) drug therapy: Secondary | ICD-10-CM | POA: Diagnosis not present

## 2020-10-03 DIAGNOSIS — E119 Type 2 diabetes mellitus without complications: Secondary | ICD-10-CM | POA: Diagnosis not present

## 2020-10-03 DIAGNOSIS — Z8616 Personal history of COVID-19: Secondary | ICD-10-CM | POA: Insufficient documentation

## 2020-10-03 DIAGNOSIS — R112 Nausea with vomiting, unspecified: Secondary | ICD-10-CM | POA: Diagnosis not present

## 2020-10-03 DIAGNOSIS — K2900 Acute gastritis without bleeding: Secondary | ICD-10-CM

## 2020-10-03 DIAGNOSIS — R109 Unspecified abdominal pain: Secondary | ICD-10-CM | POA: Diagnosis not present

## 2020-10-03 DIAGNOSIS — R1110 Vomiting, unspecified: Secondary | ICD-10-CM | POA: Diagnosis not present

## 2020-10-03 DIAGNOSIS — Z03818 Encounter for observation for suspected exposure to other biological agents ruled out: Secondary | ICD-10-CM | POA: Diagnosis not present

## 2020-10-03 DIAGNOSIS — Z794 Long term (current) use of insulin: Secondary | ICD-10-CM | POA: Diagnosis not present

## 2020-10-03 DIAGNOSIS — R1031 Right lower quadrant pain: Secondary | ICD-10-CM | POA: Diagnosis not present

## 2020-10-03 DIAGNOSIS — K760 Fatty (change of) liver, not elsewhere classified: Secondary | ICD-10-CM | POA: Diagnosis not present

## 2020-10-03 DIAGNOSIS — R6889 Other general symptoms and signs: Secondary | ICD-10-CM | POA: Diagnosis not present

## 2020-10-03 LAB — URINALYSIS, COMPLETE (UACMP) WITH MICROSCOPIC
Bacteria, UA: NONE SEEN
Bilirubin Urine: NEGATIVE
Glucose, UA: >=500 mg/dL — AB
Hgb urine dipstick: NEGATIVE
Ketones, ur: 5 mg/dL — AB
Leukocytes,Ua: NEGATIVE
Nitrite: NEGATIVE
Protein, ur: NEGATIVE mg/dL
Specific Gravity, Urine: 1.045 — ABNORMAL HIGH (ref 1.005–1.030)
pH: 5 (ref 5.0–8.0)

## 2020-10-03 LAB — HEMOGLOBIN A1C
Hgb A1c MFr Bld: 14.1 % — ABNORMAL HIGH (ref 4.8–5.6)
Mean Plasma Glucose: 357.97 mg/dL

## 2020-10-03 LAB — LIPASE, BLOOD: Lipase: 30 U/L (ref 11–51)

## 2020-10-03 LAB — COMPREHENSIVE METABOLIC PANEL
ALT: 25 U/L (ref 0–44)
AST: 18 U/L (ref 15–41)
Albumin: 4.2 g/dL (ref 3.5–5.0)
Alkaline Phosphatase: 114 U/L (ref 38–126)
Anion gap: 12 (ref 5–15)
BUN: 11 mg/dL (ref 6–20)
CO2: 22 mmol/L (ref 22–32)
Calcium: 9.7 mg/dL (ref 8.9–10.3)
Chloride: 95 mmol/L — ABNORMAL LOW (ref 98–111)
Creatinine, Ser: 0.57 mg/dL (ref 0.44–1.00)
GFR, Estimated: >60 mL/min (ref >60)
Glucose, Bld: 432 mg/dL — ABNORMAL HIGH (ref 70–99)
Potassium: 4.4 mmol/L (ref 3.5–5.1)
Sodium: 129 mmol/L — ABNORMAL LOW (ref 135–145)
Total Bilirubin: 0.7 mg/dL (ref 0.3–1.2)
Total Protein: 7.5 g/dL (ref 6.5–8.1)

## 2020-10-03 LAB — CBC
HCT: 46.9 % — ABNORMAL HIGH (ref 36.0–46.0)
Hemoglobin: 15.6 g/dL — ABNORMAL HIGH (ref 12.0–15.0)
MCH: 27.3 pg (ref 26.0–34.0)
MCHC: 33.3 g/dL (ref 30.0–36.0)
MCV: 82 fL (ref 80.0–100.0)
Platelets: 288 10*3/uL (ref 150–400)
RBC: 5.72 MIL/uL — ABNORMAL HIGH (ref 3.87–5.11)
RDW: 12.8 % (ref 11.5–15.5)
WBC: 6.1 10*3/uL (ref 4.0–10.5)
nRBC: 0 % (ref 0.0–0.2)

## 2020-10-03 MED ORDER — ONDANSETRON HCL 4 MG/2ML IJ SOLN
4.0000 mg | Freq: Once | INTRAMUSCULAR | Status: AC
  Administered 2020-10-03: 4 mg via INTRAVENOUS
  Filled 2020-10-03: qty 2

## 2020-10-03 MED ORDER — DICYCLOMINE HCL 10 MG PO CAPS: 10.0000 mg | ORAL_CAPSULE | Freq: Four times a day (QID) | ORAL | 0 refills | Status: DC

## 2020-10-03 MED ORDER — INSULIN ASPART PROT & ASPART (70-30 MIX) 100 UNIT/ML ~~LOC~~ SUSP: 28.0000 [IU] | Freq: Two times a day (BID) | SUBCUTANEOUS | 0 refills | Status: DC

## 2020-10-03 MED ORDER — BLOOD GLUCOSE MONITOR KIT: PACK | 0 refills | Status: DC

## 2020-10-03 MED ORDER — ONDANSETRON HCL 4 MG PO TABS: 4.0000 mg | ORAL_TABLET | Freq: Three times a day (TID) | ORAL | 0 refills | Status: DC | PRN

## 2020-10-03 MED ORDER — LACTATED RINGERS IV BOLUS
1000.0000 mL | Freq: Once | INTRAVENOUS | Status: AC
  Administered 2020-10-03: 1000 mL via INTRAVENOUS

## 2020-10-03 MED ORDER — IOHEXOL 300 MG/ML  SOLN
125.0000 mL | Freq: Once | INTRAMUSCULAR | Status: AC | PRN
  Administered 2020-10-03: 125 mL via INTRAVENOUS

## 2020-10-03 MED ORDER — HYDROMORPHONE HCL 1 MG/ML IJ SOLN
0.5000 mg | Freq: Once | INTRAMUSCULAR | Status: AC
  Administered 2020-10-03: 0.5 mg via INTRAVENOUS
  Filled 2020-10-03: qty 1

## 2020-10-03 MED ORDER — INSULIN ASPART 100 UNIT/ML ~~LOC~~ SOLN: 0.0000 [IU] | SUBCUTANEOUS | Status: DC

## 2020-10-03 MED ORDER — PEN NEEDLES 31G X 6 MM MISC: 0 refills | Status: DC

## 2020-10-03 NOTE — ED Provider Notes (Signed)
[facility] Emergency Department Provider Note  ____________________________________________   Event Date/Time   First MD Initiated Contact with Patient 10/03/20 1725     (approximate)  I have reviewed the triage vital signs and the nursing notes.   HISTORY  Chief Complaint No chief complaint on file.   HPI Joan [name] is a 40 y.o. female with a past medical history of HTN, DM, asthma, obesity, HDL, depression, PTSD and prior hysterectomy who presents for assessment of some right lower quadrant pain associate with nonbloody nonbilious vomiting that began yesterday.  Patient states she had a temperature of 1-1.4 last night and took some naproxen but this did not seem to help her pain.  She states it is in her right lower quadrant radiates to her back and she went to [facility] to send her to the ED because they were concerned about appendicitis.  She denies any headache, chest pain, cough, shortness of breath, left-sided abdominal pain or diarrhea.  She states she had normal bowel movement earlier today.  She states she does have some burning with urination but her urine checked at urgent care was told it was normal.  No rash or recent traumatic injuries or falls.  No extremity pain.  No clear alleviating aggravating factors.         Past Medical History:  Diagnosis Date  . Acute asthma exacerbation 04/10/2018  . Acute non-recurrent maxillary sinusitis 09/25/2018  . Asthma   . Auditory hallucinations    only after anesthesia  . BV (bacterial vaginosis) 02/13/2018  . Diabetes mellitus without complication (HCC)    diet controlled  . Diabetes mellitus, type II (HCC)    insulin, jardiance  . Dyspnea    with exertion  . Essential hypertension   . Essential hypertension 11/24/2017  . GERD (gastroesophageal reflux disease)    occasionally-NO MEDS  . History of placement of ear tubes 05/20/2018  . Hyperlipidemia   . MDD (major depressive disorder)   .  Miscarriage   . Nausea & vomiting 10/05/2019  . PTSD (post-traumatic stress disorder)   . Right lower quadrant abdominal pain 02/13/2018  . Tachycardia     Patient Active Problem List   Diagnosis Date Noted  . Non-intractable vomiting 10/02/2020  . Asthma exacerbation 08/03/2020  . Acute URI 07/26/2020  . COVID-19 virus infection 08/16/2019  . Kidney stone 02/11/2019  . Asthma 11/03/2018  . Recurrent sinusitis 05/20/2018  . Recurrent fever of unknown etiology 05/08/2018  . Recurrent URI (upper respiratory infection) 05/08/2018  . Tachycardia 05/04/2018  . Missed period 04/29/2018  . Diabetes mellitus type 2, uncontrolled ([place]) 11/24/2017  . Essential hypertension 11/24/2017  . Hyperlipidemia 11/24/2017  . Major depressive disorder, recurrent, severe without psychotic features ([place])   . PTSD (post-traumatic stress disorder) 04/11/2015    Past Surgical History:  Procedure Laterality Date  . ABDOMINAL HYSTERECTOMY    . ADENOIDECTOMY    . CHOLECYSTECTOMY  2009  . CYSTOSCOPY N/A 07/31/2018   Procedure: CYSTOSCOPY;  Surgeon: Benjaman Kindler, MD;  Location: [facility] ORS;  Service: Gynecology;  Laterality: N/A;  . LAPAROSCOPIC BILATERAL SALPINGECTOMY Bilateral 07/31/2018   Procedure: LAPAROSCOPIC BILATERAL SALPINGECTOMY;  Surgeon: Benjaman Kindler, MD;  Location: [facility] ORS;  Service: Gynecology;  Laterality: Bilateral;  . LAPAROSCOPIC HYSTERECTOMY N/A 07/31/2018   Procedure: HYSTERECTOMY TOTAL LAPAROSCOPIC;  Surgeon: Benjaman Kindler, MD;  Location: [facility] ORS;  Service: Gynecology;  Laterality: N/A;  . LAPAROSCOPY N/A 06/07/2019   Procedure: LAPAROSCOPY OPERATIVE, WITH PERITONEAL BIOPSIES;  Surgeon:  Benjaman Kindler, MD;  Location: [facility] ORS;  Service: Gynecology;  Laterality: N/A;  . LYSIS OF ADHESION N/A 07/31/2018   Procedure: LYSIS OF ADHESION;  Surgeon: Benjaman Kindler, MD;  Location: [facility] ORS;  Service: Gynecology;  Laterality: N/A;  . OVARY SURGERY Right    cyst removed a while ago   . TONSILLECTOMY    . tubes in ear      Prior to Admission medications   Medication Sig Start Date End Date Taking? Authorizing Provider  dicyclomine (BENTYL) 10 MG capsule Take 1 capsule (10 mg total) by mouth 4 (four) times daily for 14 days. 10/03/20 10/17/20 Yes Lucrezia Starch, MD  ondansetron (ZOFRAN) 4 MG tablet Take 1 tablet (4 mg total) by mouth every 8 (eight) hours as needed for up to 10 doses for nausea or vomiting. 10/03/20  Yes Lucrezia Starch, MD  albuterol (VENTOLIN HFA) 108 (90 Base) MCG/ACT inhaler INHALE 2 PUFFS BY MOUTH EVERY 6 HOURS AS NEEDED FOR SHORTNESS OF BREATH AND WHEEZING Patient taking differently: Inhale 2 puffs into the lungs every 6 (six) hours as needed for wheezing or shortness of breath. INHALE 2 PUFFS BY MOUTH EVERY 6 HOURS AS NEEDED FOR SHORTNESS OF BREATH AND WHEEZING 10/19/18   Pleas Koch, NP  blood glucose meter kit and supplies KIT Dispense based on patient and insurance preference. Use up three times daily as directed. (FOR ICD-9 250.00, 250.01). 10/03/20   Lucrezia Starch, MD  brompheniramine-pseudoephedrine-DM 30-2-10 MG/5ML syrup Take 5 mLs by mouth 4 (four) times daily as needed. 08/01/20   Johnn Hai, PA-C  citalopram (CELEXA) 20 MG tablet Take 1 tablet (20 mg total) by mouth daily. For anxiety. 11/09/19   Pleas Koch, NP  insulin aspart protamine- aspart (NOVOLOG MIX 70/30) (70-30) 100 UNIT/ML injection Inject 0.28 mLs (28 Units total) into the skin 2 (two) times daily with a meal. 10/03/20 11/02/20  Lucrezia Starch, MD  Insulin Pen Needle (PEN NEEDLES) 31G X 6 MM MISC Use with insulin as directed. 10/03/20   Lucrezia Starch, MD  ipratropium (ATROVENT) 0.06 % nasal spray Place 2 sprays into both nostrils 4 (four) times daily. 09/22/20   Danton Clap, PA-C  losartan (COZAAR) 25 MG tablet Take 1 tablet (25 mg total) by mouth daily. For blood pressure. 02/08/20   Pleas Koch, NP  montelukast (SINGULAIR) 10 MG tablet Take 10 mg by mouth  at bedtime.  Patient not taking: No sig reported    [provider]  rosuvastatin (CRESTOR) 10 MG tablet Take 1 tablet (10 mg total) by mouth every evening. For cholesterol. Patient not taking: No sig reported 09/06/19   Pleas Koch, NP    Allergies Ibuprofen, Ciprofloxacin, Metformin and related, Penicillins, Sulfa antibiotics, Other, and Shellfish allergy  Family History  Problem Relation Age of Onset  . Asthma Mother   . Diabetes Mother   . Hyperlipidemia Mother   . Hypertension Mother   . Diabetes Father   . Hyperlipidemia Father   . Hypertension Father   . Diabetes Brother   . Depression Brother   . Alcohol abuse Brother   . Depression Maternal Grandmother   . Stroke Paternal Grandmother   . Breast cancer Neg Hx     Social History Social History   Tobacco Use  . Smoking status: Never Smoker  . Smokeless tobacco: Never Used  Vaping Use  . Vaping Use: Never used  Substance Use Topics  . Alcohol use: No  .  Drug use: No    Review of Systems  Review of Systems  Constitutional: Positive for fever. Negative for chills.  HENT: Negative for sore throat.   Eyes: Negative for pain.  Respiratory: Negative for cough and stridor.   Cardiovascular: Negative for chest pain.  Gastrointestinal: Positive for abdominal pain, nausea and vomiting.  Genitourinary: Positive for dysuria.  Musculoskeletal: Negative for myalgias.  Skin: Negative for rash.  Neurological: Negative for seizures, loss of consciousness and headaches.  Psychiatric/Behavioral: Negative for suicidal ideas.  All other systems reviewed and are negative.     ____________________________________________   PHYSICAL EXAM:  VITAL SIGNS: ED Triage Vitals  Enc Vitals Group     BP 10/03/20 1452 (!) 144/110     Pulse Rate 10/03/20 1452 (!) 118     Resp 10/03/20 1452 16     Temp 10/03/20 1452 97.8 F (36.6 C)     Temp Source 10/03/20 1452 Oral     SpO2 10/03/20 1452 98 %     Weight --       Height --      Head Circumference --      Peak Flow --      Pain Score 10/03/20 1448 9     Pain Loc --      Pain Edu? --      Excl. in [place]? --    Vitals:   10/03/20 1743 10/03/20 1900  BP: (!) 132/93 134/76  Pulse: 100 79  Resp: 16 16  Temp: 97.8 F (36.6 C)   SpO2: 99% 99%   Physical Exam Vitals and nursing note reviewed.  Constitutional:      General: She is not in acute distress.    Appearance: She is well-developed and well-nourished. She is obese.  HENT:     Head: Normocephalic and atraumatic.     Right Ear: External ear normal.     Left Ear: External ear normal.     Nose: Nose normal.  Eyes:     Conjunctiva/sclera: Conjunctivae normal.  Cardiovascular:     Rate and Rhythm: Normal rate and regular rhythm.     Pulses: Normal pulses.     Heart sounds: No murmur heard.   Pulmonary:     Effort: Pulmonary effort is normal. No respiratory distress.     Breath sounds: Normal breath sounds.  Abdominal:     Palpations: Abdomen is soft.     Tenderness: There is abdominal tenderness in the right lower quadrant. There is right CVA tenderness. There is no left CVA tenderness.  Musculoskeletal:        General: No edema.     Cervical back: Neck supple.  Skin:    General: Skin is warm and dry.  Neurological:     Mental Status: She is alert and oriented to person, place, and time.  Psychiatric:        Mood and Affect: Mood and affect normal.      ____________________________________________   LABS (all labs ordered are listed, but only abnormal results are displayed)  Labs Reviewed  COMPREHENSIVE METABOLIC PANEL - Abnormal; Notable for the following components:      Result Value   Sodium 129 (*)    Chloride 95 (*)    Glucose, Bld 432 (*)    All other components within normal limits  CBC - Abnormal; Notable for the following components:   RBC 5.72 (*)    Hemoglobin 15.6 (*)    HCT 46.9 (*)    All other  components within normal limits  LIPASE, BLOOD   URINALYSIS, COMPLETE (UACMP) WITH MICROSCOPIC  HEMOGLOBIN A1C   ____________________________________________  EKG  ____________________________________________  RADIOLOGY  ED MD interpretation: No evidence of appendicitis, enlarged or abnormal ovary, cholecystitis, pancreatitis, kidney stone, diverticulitis, SBO or other clear acute abdominal pelvic pathology.  Official radiology report(s): CT ABDOMEN PELVIS W CONTRAST  Result Date: 10/03/2020 CLINICAL DATA:  Right lower quadrant pain for several days, vomiting EXAM: CT ABDOMEN AND PELVIS WITH CONTRAST TECHNIQUE: Multidetector CT imaging of the abdomen and pelvis was performed using the standard protocol following bolus administration of intravenous contrast. CONTRAST:  110m OMNIPAQUE IOHEXOL 300 MG/ML  SOLN COMPARISON:  10/04/2019, 02/10/2018 FINDINGS: Lower chest: No acute pleural or parenchymal lung disease. Hepatobiliary: Mild diffuse hepatic steatosis unchanged. No focal liver abnormalities. The gallbladder is surgically absent. Pancreas: Unremarkable. No pancreatic ductal dilatation or surrounding inflammatory changes. Spleen: Normal in size without focal abnormality. Adrenals/Urinary Tract: Stable nonspecific 1.9 x 1.2 cm right adrenal nodule. The left adrenals unremarkable. Kidneys enhance normally and symmetrically. No urinary tract calculi or obstructive uropathy. Bladder is unremarkable. Stomach/Bowel: No bowel obstruction or ileus. Normal gas-filled appendix right lower quadrant. No bowel wall thickening or inflammatory change. Vascular/Lymphatic: No significant vascular findings are present. No enlarged abdominal or pelvic lymph nodes. Reproductive: Status post hysterectomy. No adnexal masses. Other: No free fluid or free gas.  No abdominal wall hernia. Musculoskeletal: No acute or destructive bony lesions. Reconstructed images demonstrate no additional findings. IMPRESSION: 1. Mild hepatic steatosis. 2. No acute intra-abdominal or  intrapelvic process. Normal appendix. Electronically Signed   By: MRanda NgoM.D.   On: 10/03/2020 18:44    ____________________________________________   PROCEDURES  Procedure(s) performed (including Critical Care):  .1-3 Lead EKG Interpretation Performed by: SLucrezia Starch MD Authorized by: SLucrezia Starch MD     Interpretation: normal     ECG rate assessment: normal     Rhythm: sinus rhythm     Ectopy: none     Conduction: normal       ____________________________________________   INITIAL IMPRESSION / ASSESSMENT AND PLAN / ED COURSE      Patient presents with above-stated reexam for assessment of right lower quadrant pain associate with fever and nausea and vomiting.  On arrival she is afebrile hemodynamically stable.  Differential includes appendicitis, diverticulitis, early SBO, necrotic ovarian torsion versus cyst, pyelonephritis and cystitis.  [facility] which shows no evidence of infection.  Lipase of 30 is not consistent with acute pancreatitis.  CMP shows a glucose of 432 without any other significant electrolyte or metabolic derangements.  No evidence of DKA.  No evidence of cholestasis or hepatitis.  Very low suspicion for cholecystitis at this time.  CT obtained shows no evidence of diverticulitis, appendicitis, pyonephritis, kidney stone, or any other clear acute abdominal pelvic pathology.  Patient treated with below noted analgesia and insulin.  She states she felt better on reassessment.  Suspect likely infectious early gastroenteritis.  Advised patient on expected clinical course.  Given stable vitals with reassuring exam and work-up low suspicion for immediate life-threatening pathology believe patient safe for discharge with plan for outpatient follow-up.  Rx written for Zofran and Bentyl.  Refills provided for her insulin medications as she states she was out of this.  Advised importance  of taking insulin as directed by her prenatal PCP.  Discharge stable condition.  Return cautions advised and discussed.  ____________________________________________   FINAL CLINICAL IMPRESSION(S) / ED DIAGNOSES  Final diagnoses:  RLQ abdominal pain  Acute gastritis without hemorrhage, unspecified gastritis type    Medications  insulin aspart (novoLOG) injection 0-15 Units (has no administration in time range)  HYDROmorphone (DILAUDID) injection 0.5 mg (0.5 mg Intravenous Given 10/03/20 1843)  ondansetron (ZOFRAN) injection 4 mg (4 mg Intravenous Given 10/03/20 1843)  lactated ringers bolus 1,000 mL (1,000 mLs Intravenous New Bag/Given 10/03/20 1850)  iohexol (OMNIPAQUE) 300 MG/ML solution 125 mL (125 mLs Intravenous Contrast Given 10/03/20 1817)     ED Discharge Orders         Ordered    ondansetron (ZOFRAN) 4 MG tablet  Every 8 hours PRN        10/03/20 1905    dicyclomine (BENTYL) 10 MG capsule  4 times daily        10/03/20 1905    insulin aspart protamine- aspart (NOVOLOG MIX 70/30) (70-30) 100 UNIT/ML injection  2 times daily with meals        10/03/20 1905    Insulin Pen Needle (PEN NEEDLES) 31G X 6 MM MISC        10/03/20 1905    blood glucose meter kit and supplies KIT        10/03/20 1905           Note:  This document was prepared using Dragon voice recognition software and may include unintentional dictation errors.   Lucrezia Starch, MD 10/03/20 1910

## 2020-10-03 NOTE — ED Notes (Signed)
Per Dr. Katrinka Blazing, d/c patient without insulin or recheck.

## 2020-10-03 NOTE — ED Triage Notes (Signed)
Additional details during triage - pt mentions she had a temp of 101.4 last night, had negative UTI sample at [facility] today. RLQ pain is sharp in nature

## 2020-10-03 NOTE — ED Notes (Signed)
IV catheter removed intact without complication.  D/C instructions given.  RX and follow up discussed.  Pt instructed not to drive.  Husband at bedside.  All questions addressed.  Understanding verbalized.  Pt left ER via w/c with husband.

## 2020-10-03 NOTE — ED Triage Notes (Signed)
Pt comes via [facility] with c/o RLQ pain for few days along with vomiting.  [facility] brought pt over for acute appendicitis rule out.

## 2020-10-09 ENCOUNTER — Other Ambulatory Visit: Payer: Self-pay | Admitting: Primary Care

## 2020-10-09 DIAGNOSIS — J302 Other seasonal allergic rhinitis: Secondary | ICD-10-CM

## 2020-10-10 MED ORDER — ALBUTEROL SULFATE HFA 108 (90 BASE) MCG/ACT IN AERS: INHALATION_SPRAY | RESPIRATORY_TRACT | 0 refills | Status: DC

## 2020-10-10 NOTE — Telephone Encounter (Signed)
We need to make sure she keeps her appointment with me on 03/15. Just FYI

## 2020-10-14 ENCOUNTER — Encounter: Payer: Self-pay | Admitting: Physician Assistant

## 2020-10-14 ENCOUNTER — Telehealth: Payer: 59 | Admitting: Physician Assistant

## 2020-10-14 DIAGNOSIS — R0981 Nasal congestion: Secondary | ICD-10-CM

## 2020-10-14 DIAGNOSIS — H6691 Otitis media, unspecified, right ear: Secondary | ICD-10-CM | POA: Diagnosis not present

## 2020-10-14 DIAGNOSIS — J45901 Unspecified asthma with (acute) exacerbation: Secondary | ICD-10-CM | POA: Diagnosis not present

## 2020-10-14 DIAGNOSIS — Z20822 Contact with and (suspected) exposure to covid-19: Secondary | ICD-10-CM

## 2020-10-14 DIAGNOSIS — J209 Acute bronchitis, unspecified: Secondary | ICD-10-CM | POA: Diagnosis not present

## 2020-10-14 DIAGNOSIS — Z03818 Encounter for observation for suspected exposure to other biological agents ruled out: Secondary | ICD-10-CM | POA: Diagnosis not present

## 2020-10-14 MED ORDER — BENZONATATE 100 MG PO CAPS: 100.0000 mg | ORAL_CAPSULE | Freq: Two times a day (BID) | ORAL | 0 refills | Status: DC | PRN

## 2020-10-14 MED ORDER — PREDNISONE 20 MG PO TABS: 40.0000 mg | ORAL_TABLET | Freq: Every day | ORAL | 0 refills | Status: DC

## 2020-10-14 MED ORDER — FLUTICASONE PROPIONATE 50 MCG/ACT NA SUSP: 2.0000 | Freq: Every day | NASAL | 6 refills | Status: DC

## 2020-10-14 NOTE — Progress Notes (Signed)
E-Visit for Corona Virus Screening  Your current symptoms could be consistent with the coronavirus.  Many health care providers can now test patients at their office but not all are.   has multiple testing sites. For information on our COVID testing locations and hours go to [email or web address]  We are enrolling you in our MyChart Home Monitoring for COVID19 . Daily you will receive a questionnaire within the MyChart website. Our COVID 19 response team will be monitoring your responses daily.  Testing Information: The COVID-19 Community Testing sites are testing BY APPOINTMENT ONLY.  You can schedule online at [email or web address]  If you do not have access to a smart phone or computer you may call [phone] for an appointment.   Additional testing sites in the Community:  . For [facility] Testing sites in [facility]  FarmerBuys.[email or web address]  . For Pop-up testing sites in [place]  [email or web address]  . For [facility] [email or web address]  . For [facility] testing in [place] and [employer] [email or web address]  . For [facility] testing in [facility] [facility]   [email or web address]  For  more information about community testing call [phone]   Please quarantine yourself while awaiting your test results. Please stay home for a minimum of 10 days from the first day of illness with improving symptoms and you have had 24 hours of no fever (without the use of Tylenol (Acetaminophen) Motrin (Ibuprofen) or any fever reducing medication).  Also - Do not get tested prior to returning to work because once you have had a positive test the test can stay  positive for more than a month in some cases.   You should wear a mask or cloth face covering over your nose and mouth if you must be around other people or animals, including pets (even at home). Try to stay at least 6 feet away from other people. This will protect the people around you.  Please continue good preventive care measures, including:  frequent hand-washing, avoid touching your face, cover coughs/sneezes, stay out of crowds and keep a 6 foot distance from others.  COVID-19 is a respiratory illness with symptoms that are similar to the flu. Symptoms are typically mild to moderate, but there have been cases of severe illness and death due to the virus.   The following symptoms may appear 2-14 days after exposure: . Fever . Cough . Shortness of breath or difficulty breathing . Chills . Repeated shaking with chills . Muscle pain . Headache . Sore throat . New loss of taste or smell . Fatigue . Congestion or runny nose . Nausea or vomiting . Diarrhea  Go to the nearest hospital ED for assessment if fever/cough/breathlessness are severe or illness seems like a threat to life.  It is vitally important that if you feel that you have an infection such as this virus or any other virus that you stay home and away from places where you may spread it to others.  You should avoid contact with people age 26 and older.   You can use medication such as A prescription cough medication called Tessalon Perles 100 mg. You may take 1-2 capsules every 8 hours as needed for cough and A prescription for Fluticasone nasal spray 2 sprays in each nostril one time per day   I have prescribed Prednisone for exacerbation of your Asthma. Take 2 pills daily for 5 days.   Continue to use your rescue inhaler- albuterol- 2 puffs every 4 hours as needed.    You  may also take acetaminophen (Tylenol) as needed for fever.  Reduce your risk of any infection by using the same precautions used for avoiding the common  cold or flu:  Marland Kitchen Wash your hands often with soap and warm water for at least 20 seconds.  If soap and water are not readily available, use an alcohol-based hand sanitizer with at least 60% alcohol.  . If coughing or sneezing, cover your mouth and nose by coughing or sneezing into the elbow areas of your shirt or coat, into a tissue or into your sleeve (not your hands). . Avoid shaking hands with others and consider head nods or verbal greetings only. . Avoid touching your eyes, nose, or mouth with unwashed hands.  . Avoid close contact with people who are sick. . Avoid places or events with large numbers of people in one location, like concerts or sporting events. . Carefully consider travel plans you have or are making. . If you are planning any travel outside or inside the [place], visit the CDC's Travelers' Health webpage for the latest health notices. . If you have some symptoms but not all symptoms, continue to monitor at home and seek medical attention if your symptoms worsen. . If you are having a medical emergency, call 911.  HOME CARE . Only take medications as instructed by your medical team. . Drink plenty of fluids and get plenty of rest. . A steam or ultrasonic humidifier can help if you have congestion.   GET HELP RIGHT AWAY IF YOU HAVE EMERGENCY WARNING SIGNS** FOR COVID-19. If you or someone is showing any of these signs seek emergency medical care immediately. Call 911 or proceed to your closest emergency facility if: . You develop worsening high fever. . Trouble breathing . Bluish lips or face . Persistent pain or pressure in the chest . New confusion . Inability to wake or stay awake . You cough up blood. . Your symptoms become more severe  **This list is not all possible symptoms. Contact your medical provider for any symptoms that are sever or concerning to you.  MAKE SURE YOU   Understand these instructions.  Will watch your condition.  Will get help right away if  you are not doing well or get worse.  Your e-visit answers were reviewed by a board certified advanced clinical practitioner to complete your personal care plan.  Depending on the condition, your plan could have included both over the counter or prescription medications.  If there is a problem please reply once you have received a response from your provider.  Your safety is important to [place].  If you have drug allergies check your prescription carefully.    You can use MyChart to ask questions about today's visit, request a non-urgent call back, or ask for a work or school excuse for 24 hours related to this e-Visit. If it has been greater than 24 hours you will need to follow up with your provider, or enter a new e-Visit to address those concerns. You will get an e-mail in the next two days asking about your experience.  I hope that your e-visit has been valuable and will speed your recovery. Thank you for using e-visits.   I spent 5-10 minutes on review and completion of this note- Illa Level [facility] [place] [facility]

## 2020-10-17 ENCOUNTER — Ambulatory Visit: Payer: 59 | Admitting: Primary Care

## 2020-10-17 DIAGNOSIS — J9811 Atelectasis: Secondary | ICD-10-CM | POA: Diagnosis not present

## 2020-10-17 DIAGNOSIS — Z03818 Encounter for observation for suspected exposure to other biological agents ruled out: Secondary | ICD-10-CM | POA: Diagnosis not present

## 2020-10-17 DIAGNOSIS — R059 Cough, unspecified: Secondary | ICD-10-CM | POA: Diagnosis not present

## 2020-10-17 DIAGNOSIS — E1165 Type 2 diabetes mellitus with hyperglycemia: Secondary | ICD-10-CM | POA: Diagnosis not present

## 2020-10-17 DIAGNOSIS — J209 Acute bronchitis, unspecified: Secondary | ICD-10-CM | POA: Diagnosis not present

## 2020-10-17 NOTE — Telephone Encounter (Signed)
Please come see me regarding this patient.

## 2020-10-19 DIAGNOSIS — R0602 Shortness of breath: Secondary | ICD-10-CM | POA: Diagnosis not present

## 2020-10-24 ENCOUNTER — Ambulatory Visit: Payer: 59 | Admitting: Primary Care

## 2020-10-24 ENCOUNTER — Other Ambulatory Visit: Payer: Self-pay

## 2020-10-24 VITALS — BP 118/78 | HR 116 | Temp 98.9°F | Ht 66.0 in | Wt 296.5 lb

## 2020-10-24 DIAGNOSIS — J4540 Moderate persistent asthma, uncomplicated: Secondary | ICD-10-CM | POA: Diagnosis not present

## 2020-10-24 DIAGNOSIS — F332 Major depressive disorder, recurrent severe without psychotic features: Secondary | ICD-10-CM | POA: Diagnosis not present

## 2020-10-24 DIAGNOSIS — J069 Acute upper respiratory infection, unspecified: Secondary | ICD-10-CM | POA: Diagnosis not present

## 2020-10-24 DIAGNOSIS — I10 Essential (primary) hypertension: Secondary | ICD-10-CM | POA: Diagnosis not present

## 2020-10-24 DIAGNOSIS — E785 Hyperlipidemia, unspecified: Secondary | ICD-10-CM

## 2020-10-24 DIAGNOSIS — F411 Generalized anxiety disorder: Secondary | ICD-10-CM | POA: Diagnosis not present

## 2020-10-24 DIAGNOSIS — E1165 Type 2 diabetes mellitus with hyperglycemia: Secondary | ICD-10-CM | POA: Diagnosis not present

## 2020-10-24 DIAGNOSIS — R69 Illness, unspecified: Secondary | ICD-10-CM | POA: Diagnosis not present

## 2020-10-24 MED ORDER — ROSUVASTATIN CALCIUM 10 MG PO TABS: 10.0000 mg | ORAL_TABLET | Freq: Every evening | ORAL | 3 refills | Status: DC

## 2020-10-24 MED ORDER — CITALOPRAM HYDROBROMIDE 40 MG PO TABS: 40.0000 mg | ORAL_TABLET | Freq: Every day | ORAL | 0 refills | Status: DC

## 2020-10-24 MED ORDER — TRULICITY 1.5 MG/0.5ML ~~LOC~~ SOAJ: 1.5000 mg | SUBCUTANEOUS | 0 refills | Status: DC

## 2020-10-24 NOTE — Assessment & Plan Note (Signed)
Deteriorated, likely secondary to recurrent illness, uncontrolled diabetes, undiagnosed sleep apnea.  Agree to increase dose of citalopram to 40 mg for now, new prescription sent to pharmacy.  She will update.

## 2020-10-24 NOTE — Assessment & Plan Note (Signed)
Recent A1c of 14.1 which is also multifactorial including recurrent prednisone use, noncompliance to diabetes medication, poor diet.  She is not checking glucose readings, discussed the absolute need to do so.  Unfortunately we cannot adjust or change her insulin regimen until we have a series of readings.  Continue NovoLog 70/30 36 units twice daily for now. She will send readings via MyChart in a few days.  Would like to switch her back to Lantus nightly with NovoLog meal coverage during the day.  She seemed to do the best on this regimen.  We will add back Trulicity, she prefers to start at 1.5 mg weekly as she tolerated well.  Prescription sent to pharmacy.  We will plan to see her back in 1 month for follow-up.

## 2020-10-24 NOTE — Assessment & Plan Note (Signed)
Inconsistent use of losartan 25 mg, discussed to resume daily, especially for renal protection against longstanding uncontrolled diabetes.   She agrees to resume.

## 2020-10-24 NOTE — Assessment & Plan Note (Signed)
Noncompliant to rosuvastatin 10 mg, refill sent to pharmacy.  She will resume.

## 2020-10-24 NOTE — Progress Notes (Signed)
Subjective:    Patient ID: Joan [name], female    DOB: 1980-09-01, 40 y.o.   MRN: [id number]  HPI  Joan [name] is a very pleasant 40 y.o. female with a history of hypertension, uncontrolled diabetes, asthma, recurrent URI, PTSD, MDD, hyperlipidemia who presents today for follow up of chronic conditions.  1) Type 2 Diabetes:  Current medications include: Novolog 70/30 and is injecting 36 units twice daily. She was once on Trulicity which was effective, she had to discontinue due to lack of insurance. She would like to resume her regimen of long acting insulin and meal time insulin, also had to discontinue these numerous times in the past due to lack of insurance coverage.   She's been on numerous rounds of prednisone since December 2021 which has made her diabetes very difficult to control.   Her diabetes has also been difficult to control over the years as she's been off and on insurance over the years causing her regimen to change to more affordable regimens. She's also been non compliant to treatment at various times over the years.   She has an appointment with endocrinology in mid May 2022.   She is checking her blood glucose 0 times daily and is not checking them as she "knows there bad".   Last A1C: 14.1 in March 2022 Last Eye Exam: Due Last Foot Exam: Due Pneumonia Vaccination: ACE/ARB: Losartan  Statin: Crestor, stopped taking.    2) Hypertension: Currently managed on losartan 25 mg daily. She's been inconsistent with this regimen.   BP Readings from Last 3 Encounters:  10/24/20 118/78  10/03/20 138/78  09/22/20 127/87     3) Asthma: Admission to [facility] in late December 2021 for uncontrolled asthma. Since then continues to feel sick, is coughing up "green stuff". She's been managed on five rounds of prednisone since December 2021 due to recurrent illness and asthma exacerbations. Following with asthma specialist with most recent visit last week, initiated on Trelegy  Ellipta, Singulair 10 mg, and prednisone.  She continues to experience cough, ear pain. She is very frustrated as most health care providers continue to provider her with prednisone which has made her diabetes very difficult to control.   She was also initiated on gabapentin 300 mg HS per pulmonology for restless legs and pain, no improvement. The plan is to undergo sleep study for OSA evaluation given daytime drowsiness, snoring, and witnesses periods of apnea. She is pending Producer, television/film/video.   4) PTSD/MDD: Chronic, symptoms with panic attacks and increased anxiety over the last several months. Hasn't felt well since Covid diagnosis in early 2021. Currently managed on citalopram 20 mg and doesn't feel that this regimen is effective any longer. Previously following with psychiatry [facility] and didn't like the way she was treated by the psychiatrist, doesn't wish to return.   She is planning on setting up an appointment with EAP counseling.   Review of Systems  Constitutional: Positive for fatigue. Negative for fever.  Respiratory: Positive for cough and shortness of breath.   Cardiovascular: Negative for chest pain.  Psychiatric/Behavioral: The patient is nervous/anxious.          Past Medical History:  Diagnosis Date  . Acute asthma exacerbation 04/10/2018  . Acute non-recurrent maxillary sinusitis 09/25/2018  . Asthma   . Auditory hallucinations    only after anesthesia  . BV (bacterial vaginosis) 02/13/2018  . Diabetes mellitus without complication (HCC)    diet controlled  . Diabetes mellitus, type II ([place])  insulin, jardiance  . Dyspnea    with exertion  . Essential hypertension   . Essential hypertension 11/24/2017  . GERD (gastroesophageal reflux disease)    occasionally-NO MEDS  . History of placement of ear tubes 05/20/2018  . Hyperlipidemia   . MDD (major depressive disorder)   . Miscarriage   . Nausea & vomiting 10/05/2019  . PTSD (post-traumatic stress disorder)   .  Right lower quadrant abdominal pain 02/13/2018  . Tachycardia     Social History   Socioeconomic History  . Marital status: Married    Spouse name: chip  . Number of children: 0  . Years of education: Not on file  . Highest education level: High school graduate  Occupational History  . Occupation: day care worker  Tobacco Use  . Smoking status: Never Smoker  . Smokeless tobacco: Never Used  Vaping Use  . Vaping Use: Never used  Substance and Sexual Activity  . Alcohol use: No  . Drug use: No  . Sexual activity: Yes  Other Topics Concern  . Not on file  Social History Narrative  . Not on file   Social Determinants of Health   Financial Resource Strain: Not on file  Food Insecurity: Not on file  Transportation Needs: Not on file  Physical Activity: Not on file  Stress: Not on file  Social Connections: Not on file  Intimate Partner Violence: Not on file    Past Surgical History:  Procedure Laterality Date  . ABDOMINAL HYSTERECTOMY    . ADENOIDECTOMY    . CHOLECYSTECTOMY  2009  . CYSTOSCOPY N/A 07/31/2018   Procedure: CYSTOSCOPY;  Surgeon: Benjaman Kindler, MD;  Location: [facility] OR[facility];  Service: Gynecology;  Laterality: N/A;  . LAPAROSCOPIC BILATERAL SALPINGECTOMY Bilateral 07/31/2018   Procedure: LAPAROSCOPIC BILATERAL SALPINGECTOMY;  Surgeon: Benjaman Kindler, MD;  Location: [facility] OR[facility];  Service: Gynecology;  Laterality: Bilateral;  . LAPAROSCOPIC HYSTERECTOMY N/A 07/31/2018   Procedure: HYSTERECTOMY TOTAL LAPAROSCOPIC;  Surgeon: Benjaman Kindler, MD;  Location: [facility] OR[facility];  Service: Gynecology;  Laterality: N/A;  . LAPAROSCOPY N/A 06/07/2019   Procedure: LAPAROSCOPY OPERATIVE, WITH PERITONEAL BIOPSIES;  Surgeon: Benjaman Kindler, MD;  Location: [facility] ORS;  Service: Gynecology;  Laterality: N/A;  . LYSIS OF ADHESION N/A 07/31/2018   Procedure: LYSIS OF ADHESION;  Surgeon: Benjaman Kindler, MD;  Location: [facility] ORS;  Service: Gynecology;  Laterality: N/A;  . OVARY SURGERY Right     cyst removed a while ago  . TONSILLECTOMY    . tubes in ear      Family History  Problem Relation Age of Onset  . Asthma Mother   . Diabetes Mother   . Hyperlipidemia Mother   . Hypertension Mother   . Diabetes Father   . Hyperlipidemia Father   . Hypertension Father   . Diabetes Brother   . Depression Brother   . Alcohol abuse Brother   . Depression Maternal Grandmother   . Stroke Paternal Grandmother   . Breast cancer Neg Hx     Allergies  Allergen Reactions  . Ibuprofen Swelling    Facial   . Ciprofloxacin Hives and Rash  . Metformin And Related Other (See Comments)    Elevated Lactic Acid  . Penicillins Hives and Rash    Has patient had a PCN reaction causing immediate rash, facial/tongue/throat swelling, SOB or lightheadedness with hypotension: No Has patient had a PCN reaction causing severe rash involving mucus membranes or skin necrosis: No Has patient had a PCN reaction that required hospitalization: No  Has patient had a PCN reaction occurring within the last 10 years: No If all of the above answers are "NO", then may proceed with Cephalosporin use. THE PATIENT IS ABLE TO TOLERATE CEPHALOSPORINS WITHOUT DIFFIC  . Sulfa Antibiotics Hives and Rash  . Other     ALL NUTS-SCRATCHY THROAT  . Shellfish Allergy Other (See Comments)    ALL SEAFOOD-SCRATCHY THROAT    Current Outpatient Medications on File Prior to Visit  Medication Sig Dispense Refill  . albuterol (VENTOLIN HFA) 108 (90 Base) MCG/ACT inhaler INHALE 2 PUFFS BY MOUTH EVERY 6 HOURS AS NEEDED FOR SHORTNESS OF BREATH AND WHEEZING 8 g 0  . blood glucose meter kit and supplies KIT Dispense based on patient and insurance preference. Use up three times daily as directed. (FOR ICD-9 250.00, 250.01). 1 each 0  . brompheniramine-pseudoephedrine-DM 30-2-10 MG/5ML syrup Take 5 mLs by mouth 4 (four) times daily as needed. 120 mL 0  . citalopram (CELEXA) 20 MG tablet Take 1 tablet (20 mg total) by mouth daily. For  anxiety. 90 tablet 3  . fluticasone (FLONASE) 50 MCG/ACT nasal spray Place 2 sprays into both nostrils daily. 16 g 6  . insulin aspart protamine- aspart (NOVOLOG MIX 70/30) (70-30) 100 UNIT/ML injection Inject 0.28 mLs (28 Units total) into the skin 2 (two) times daily with a meal. 16.8 mL 0  . Insulin Pen Needle (PEN NEEDLES) 31G X 6 MM MISC Use with insulin as directed. 100 each 0  . ipratropium (ATROVENT) 0.06 % nasal spray Place 2 sprays into both nostrils 4 (four) times daily. 15 mL 12  . losartan (COZAAR) 25 MG tablet Take 1 tablet (25 mg total) by mouth daily. For blood pressure. 90 tablet 3  . montelukast (SINGULAIR) 10 MG tablet Take 10 mg by mouth at bedtime.    . ondansetron (ZOFRAN) 4 MG tablet Take 1 tablet (4 mg total) by mouth every 8 (eight) hours as needed for up to 10 doses for nausea or vomiting. 10 tablet 0  . predniSONE (DELTASONE) 20 MG tablet Take 2 tablets (40 mg total) by mouth daily with breakfast. 10 tablet 0  . rosuvastatin (CRESTOR) 10 MG tablet Take 1 tablet (10 mg total) by mouth every evening. For cholesterol. 90 tablet 3   No current facility-administered medications on file prior to visit.    BP 118/78   Pulse (!) 116   Temp 98.9 F (37.2 C) (Temporal)   Ht _0  (1.676 m)   Wt 296 lb 8 oz (134.5 kg)   LMP 07/24/2018 (Exact Date) Comment: surgery 07/31/2018  SpO2 97%   BMI 47.86 kg/m  Objective:   Physical Exam Constitutional:      General: She is not in acute distress. Cardiovascular:     Rate and Rhythm: Normal rate and regular rhythm.  Pulmonary:     Effort: Pulmonary effort is normal.     Breath sounds: Normal breath sounds. No wheezing.  Musculoskeletal:     Cervical back: Neck supple.  Skin:    General: Skin is warm and dry.  Neurological:     Mental Status: She is alert.  Psychiatric:        Mood and Affect: Mood normal.           Assessment & Plan:      This visit occurred during the SARS-CoV-2 public health emergency.   Safety protocols were in place, including screening questions prior to the visit, additional usage of staff PPE, and extensive cleaning of  exam room while observing appropriate contact time as indicated for disinfecting solutions.

## 2020-10-24 NOTE — Assessment & Plan Note (Signed)
Uncontrolled, following now with pulmonology through [facility].   Compliant to Trelegy and Singulair as prescribed. Continue same.

## 2020-10-24 NOTE — Assessment & Plan Note (Signed)
Likely multifactorial with uncontrolled asthma, uncontrolled diabetes, undiagnosed sleep apnea.  Follow with pulmonology. No antibiotics or prednisone warranted at this time.

## 2020-10-24 NOTE — Patient Instructions (Addendum)
Please resume your rosuvastatin 10 mg for cholesterol.  We increased the dose of your citalopram to 40 mg for anxiety.   Start checking your blood sugar levels.  Appropriate times to check your blood sugar levels are:  -Before any meal (breakfast, lunch, dinner) -Two hours after any meal (breakfast, lunch, dinner) -Bedtime  Record your readings and send them to me via My Chart on Friday this week.  Resume Trulicity 1.5 mg once weekly for diabetes.   Please schedule a follow up appointment in 1 month.  It was a pleasure to see you today!

## 2020-10-29 DIAGNOSIS — E1165 Type 2 diabetes mellitus with hyperglycemia: Secondary | ICD-10-CM

## 2020-10-31 MED ORDER — NOVOLOG 70/30 FLEXPEN RELION (70-30) 100 UNIT/ML ~~LOC~~ SUPN: 36.0000 [IU] | PEN_INJECTOR | Freq: Two times a day (BID) | SUBCUTANEOUS | 3 refills | Status: DC

## 2020-11-01 ENCOUNTER — Other Ambulatory Visit: Payer: Self-pay | Admitting: Primary Care

## 2020-11-01 DIAGNOSIS — J302 Other seasonal allergic rhinitis: Secondary | ICD-10-CM

## 2020-11-02 DIAGNOSIS — J4550 Severe persistent asthma, uncomplicated: Secondary | ICD-10-CM | POA: Diagnosis not present

## 2020-11-24 ENCOUNTER — Ambulatory Visit: Payer: 59 | Admitting: Primary Care

## 2020-12-04 ENCOUNTER — Telehealth: Payer: Self-pay | Admitting: Pharmacist

## 2020-12-04 NOTE — Telephone Encounter (Signed)
Patient failed to provide requested 2022 financial documentation. Unable to determine patient's eligibility status for [facility]. No additional medication assistance will be provided by [facility] without the required proof of income documentation. Patient notified by letter.  Vonda [facility] Administrative Assistant 

## 2020-12-15 ENCOUNTER — Ambulatory Visit: Payer: Managed Care, Other (non HMO) | Admitting: Primary Care

## 2020-12-28 DIAGNOSIS — E1165 Type 2 diabetes mellitus with hyperglycemia: Secondary | ICD-10-CM

## 2021-01-09 ENCOUNTER — Encounter: Payer: Self-pay | Admitting: Emergency Medicine

## 2021-01-09 ENCOUNTER — Other Ambulatory Visit: Payer: Self-pay

## 2021-01-09 ENCOUNTER — Ambulatory Visit
Admission: EM | Admit: 2021-01-09 | Discharge: 2021-01-09 | Disposition: A | Discharge status: Home / Self Care | Payer: Managed Care, Other (non HMO) | Attending: Emergency Medicine | Admitting: Emergency Medicine

## 2021-01-09 DIAGNOSIS — B349 Viral infection, unspecified: Secondary | ICD-10-CM | POA: Diagnosis not present

## 2021-01-09 DIAGNOSIS — R110 Nausea: Secondary | ICD-10-CM | POA: Diagnosis not present

## 2021-01-09 MED ORDER — ONDANSETRON 4 MG PO TBDP: 4.0000 mg | ORAL_TABLET | Freq: Three times a day (TID) | ORAL | 0 refills | Status: DC | PRN

## 2021-01-09 NOTE — ED Triage Notes (Signed)
Patient nonproductive cough, headache, and nausea x 1 day.   Patient endorses a fever of 101 F at home this morning.   Patient endorses ABD pain and nausea.   Patient denies diarrhea.   Patient denies having any recent COVID-19 testing.   Patient has used Tylenol for fever ( 0630 this morning).

## 2021-01-09 NOTE — ED Provider Notes (Signed)
[name]    CSN: [id number] Arrival date & time: 01/09/21  9528      History   Chief Complaint Chief Complaint  Patient presents with  . Cough  . Headache  . Nausea    HPI Joan [name] is a 40 y.o. female.   Patient presents with fever, headache, cough, abdominal pain, nausea since yesterday.  She denies rash, wheezing, shortness of breath, vomiting, diarrhea, dysuria, or other symptoms.  Treatment at home with Tylenol.  Her medical history includes recurrent sinusitis, asthma, hypertension, diabetes, depression, PTSD, auditory hallucinations.  The history is provided by the patient and medical records.    Past Medical History:  Diagnosis Date  . Acute asthma exacerbation 04/10/2018  . Acute non-recurrent maxillary sinusitis 09/25/2018  . Asthma   . Auditory hallucinations    only after anesthesia  . BV (bacterial vaginosis) 02/13/2018  . Diabetes mellitus without complication (HCC)    diet controlled  . Diabetes mellitus, type II (HCC)    insulin, jardiance  . Dyspnea    with exertion  . Essential hypertension   . Essential hypertension 11/24/2017  . GERD (gastroesophageal reflux disease)    occasionally-NO MEDS  . History of placement of ear tubes 05/20/2018  . Hyperlipidemia   . MDD (major depressive disorder)   . Miscarriage   . Nausea & vomiting 10/05/2019  . PTSD (post-traumatic stress disorder)   . Right lower quadrant abdominal pain 02/13/2018  . Tachycardia     Patient Active Problem List   Diagnosis Date Noted  . GAD (generalized anxiety disorder) 10/24/2020  . Non-intractable vomiting 10/02/2020  . Asthma exacerbation 08/03/2020  . Acute URI 07/26/2020  . COVID-19 virus infection 08/16/2019  . Kidney stone 02/11/2019  . Asthma 11/03/2018  . Recurrent sinusitis 05/20/2018  . Recurrent fever of unknown etiology 05/08/2018  . Recurrent URI (upper respiratory infection) 05/08/2018  . Tachycardia 05/04/2018  . Missed period 04/29/2018  .  Diabetes mellitus type 2, uncontrolled ([place]) 11/24/2017  . Essential hypertension 11/24/2017  . Hyperlipidemia 11/24/2017  . Major depressive disorder, recurrent, severe without psychotic features ([place])   . PTSD (post-traumatic stress disorder) 04/11/2015    Past Surgical History:  Procedure Laterality Date  . ABDOMINAL HYSTERECTOMY    . ADENOIDECTOMY    . CHOLECYSTECTOMY  2009  . CYSTOSCOPY N/A 07/31/2018   Procedure: CYSTOSCOPY;  Surgeon: Benjaman Kindler, MD;  Location: [facility] ORS;  Service: Gynecology;  Laterality: N/A;  . LAPAROSCOPIC BILATERAL SALPINGECTOMY Bilateral 07/31/2018   Procedure: LAPAROSCOPIC BILATERAL SALPINGECTOMY;  Surgeon: Benjaman Kindler, MD;  Location: [facility] ORS;  Service: Gynecology;  Laterality: Bilateral;  . LAPAROSCOPIC HYSTERECTOMY N/A 07/31/2018   Procedure: HYSTERECTOMY TOTAL LAPAROSCOPIC;  Surgeon: Benjaman Kindler, MD;  Location: [facility] ORS;  Service: Gynecology;  Laterality: N/A;  . LAPAROSCOPY N/A 06/07/2019   Procedure: LAPAROSCOPY OPERATIVE, WITH PERITONEAL BIOPSIES;  Surgeon: Benjaman Kindler, MD;  Location: [facility] OR[facility];  Service: Gynecology;  Laterality: N/A;  . LYSIS OF ADHESION N/A 07/31/2018   Procedure: LYSIS OF ADHESION;  Surgeon: Benjaman Kindler, MD;  Location: [facility] ORS;  Service: Gynecology;  Laterality: N/A;  . OVARY SURGERY Right    cyst removed a while ago  . TONSILLECTOMY    . tubes in ear      OB History   No obstetric history on file.      Home Medications    Prior to Admission medications   Medication Sig Start Date End Date Taking? Authorizing Provider  citalopram (CELEXA) 40 MG tablet  Take 1 tablet (40 mg total) by mouth daily. For anxiety. 10/24/20  Yes Pleas Koch, NP  Dulaglutide (TRULICITY) 1.5 OL/0.7EM SOPN Inject 1.5 mg into the skin once a week. For diabetes. 10/24/20  Yes Pleas Koch, NP  insulin aspart protamine - aspart (NOVOLOG 70/30 FLEXPEN RELION) (70-30) 100 UNIT/ML FlexPen Inject 0.36 mLs (36 Units  total) into the skin 2 (two) times daily with a meal. 10/31/20  Yes Pleas Koch, NP  losartan (COZAAR) 25 MG tablet Take 1 tablet (25 mg total) by mouth daily. For blood pressure. 02/08/20  Yes Pleas Koch, NP  montelukast (SINGULAIR) 10 MG tablet Take 10 mg by mouth at bedtime.   Yes [provider]  ondansetron (ZOFRAN ODT) 4 MG disintegrating tablet Take 1 tablet (4 mg total) by mouth every 8 (eight) hours as needed for nausea or vomiting. 01/09/21  Yes Sharion Balloon, NP  rosuvastatin (CRESTOR) 10 MG tablet Take 1 tablet (10 mg total) by mouth every evening. For cholesterol. 10/24/20  Yes Pleas Koch, NP  albuterol (VENTOLIN HFA) 108 (90 Base) MCG/ACT inhaler INHALE 2 PUFFS BY MOUTH EVERY 6 HOURS AS NEEDED FOR SHORTNESS OF BREATH AND WHEEZING 10/10/20   Pleas Koch, NP  BD VEO INSULIN SYRINGE U/F 31G X 15/64" 0.5 ML MISC USE AS DIRECTED 2 TIMES A DAY WITH INSULIN 08/08/20 08/08/21    blood glucose meter kit and supplies KIT Dispense based on patient and insurance preference. Use up three times daily as directed. (FOR ICD-9 250.00, 250.01). 10/03/20   Lucrezia Starch, MD  brompheniramine-pseudoephedrine-DM 30-2-10 MG/5ML syrup Take 5 mLs by mouth 4 (four) times daily as needed. 08/01/20   Johnn Hai, PA-C  fluticasone (FLONASE) 50 MCG/ACT nasal spray Place 2 sprays into both nostrils daily. 10/14/20   Waldon Merl, PA-C  Fluticasone-Umeclidin-Vilant 100-62.5-25 MCG/INH AEPB Inhale 1 puff into the lungs daily.    [provider]  Insulin Pen Needle (PEN NEEDLES) 31G X 6 MM MISC Use with insulin as directed. 10/03/20   Lucrezia Starch, MD  ipratropium (ATROVENT) 0.06 % nasal spray Place 2 sprays into both nostrils 4 (four) times daily. 09/22/20   Danton Clap, PA-C  predniSONE (DELTASONE) 20 MG tablet Take 2 tablets (40 mg total) by mouth daily with breakfast. 10/14/20   Waldon Merl, PA-C    Family History Family History  Problem Relation Age of Onset  .  Asthma Mother   . Diabetes Mother   . Hyperlipidemia Mother   . Hypertension Mother   . Diabetes Father   . Hyperlipidemia Father   . Hypertension Father   . Diabetes Brother   . Depression Brother   . Alcohol abuse Brother   . Depression Maternal Grandmother   . Stroke Paternal Grandmother   . Breast cancer Neg Hx     Social History Social History   Tobacco Use  . Smoking status: Never Smoker  . Smokeless tobacco: Never Used  Vaping Use  . Vaping Use: Never used  Substance Use Topics  . Alcohol use: No  . Drug use: No     Allergies   Ibuprofen, Ciprofloxacin, Metformin and related, Penicillins, Sulfa antibiotics, Other, and Shellfish allergy   Review of Systems Review of Systems  Constitutional: Positive for fever. Negative for chills.  HENT: Negative for ear pain and sore throat.   Respiratory: Positive for cough. Negative for shortness of breath.   Cardiovascular: Negative for chest pain and palpitations.  Gastrointestinal: Positive for abdominal pain and nausea. Negative for diarrhea and vomiting.  Genitourinary: Negative for dysuria, flank pain, hematuria, pelvic pain and vaginal discharge.  Skin: Negative for color change and rash.  Neurological: Positive for headaches. Negative for syncope and weakness.  All other systems reviewed and are negative.    Physical Exam Triage Vital Signs ED Triage Vitals  Enc Vitals Group     BP      Pulse      Resp      Temp      Temp src      SpO2      Weight      Height      Head Circumference      Peak Flow      Pain Score      Pain Loc      Pain Edu?      Excl. in [place]?    No data found.  Updated Vital Signs BP 109/77 (BP Location: Left Arm)   Pulse (!) 105   Temp 98.4 F (36.9 C) (Oral)   Resp 19   LMP 07/24/2018 (Exact Date) Comment: surgery 07/31/2018  SpO2 97%   Visual Acuity Right Eye Distance:   Left Eye Distance:   Bilateral Distance:    Right Eye Near:   Left Eye Near:    Bilateral  Near:     Physical Exam Vitals and nursing note reviewed.  Constitutional:      General: She is not in acute distress.    Appearance: She is well-developed. She is obese. She is ill-appearing.  HENT:     Head: Normocephalic and atraumatic.     Right Ear: Tympanic membrane normal.     Left Ear: Tympanic membrane normal.     Nose: Nose normal.     Mouth/Throat:     Mouth: Mucous membranes are moist.     Pharynx: Oropharynx is clear.  Eyes:     Conjunctiva/sclera: Conjunctivae normal.  Cardiovascular:     Rate and Rhythm: Normal rate and regular rhythm.     Heart sounds: Normal heart sounds.  Pulmonary:     Effort: Pulmonary effort is normal. No respiratory distress.     Breath sounds: Normal breath sounds. No wheezing.  Abdominal:     General: Bowel sounds are normal.     Palpations: Abdomen is soft.     Tenderness: There is no abdominal tenderness. There is no guarding or rebound.  Musculoskeletal:     Cervical back: Neck supple.  Skin:    General: Skin is warm and dry.  Neurological:     General: No focal deficit present.     Mental Status: She is alert and oriented to person, place, and time.     Gait: Gait normal.  Psychiatric:        Mood and Affect: Mood normal.        Behavior: Behavior normal.      UC Treatments / Results  Labs (all labs ordered are listed, but only abnormal results are displayed) Labs Reviewed  COVID-19, FLU A+B NAA    EKG   Radiology No results found.  Procedures Procedures (including critical care time)  Medications Ordered in UC Medications - No data to display  Initial Impression / Assessment and Plan / UC Course  I have reviewed the triage vital signs and the nursing notes.  Pertinent labs & imaging results that were available during my care of the patient were reviewed by me and  considered in my medical decision making (see chart for details).   Nausea without vomiting, Viral illness.  Influenza and COVID pending.   Instructed patient to self quarantine per CDC guidelines.  Treating nausea with Zofran.  Instructed patient to keep her self hydrated with clear liquids.  Discussed symptomatic treatment including Tylenol, rest, hydration.  Instructed patient to follow up with PCP if her symptoms are not improving.  Patient agrees to plan of care.    Final Clinical Impressions(s) / UC Diagnoses   Final diagnoses:  Viral illness  Nausea without vomiting     Discharge Instructions     Take the antinausea medication as directed.    Keep yourself hydrated with clear liquids, such as water, Gatorade, Pedialyte, Sprite, or ginger ale.    Your COVID and Influenza tests are pending.  You should self quarantine until the test results are back.    Follow-up with your primary care provider if your symptoms are not improving.         ED Prescriptions    Medication Sig Dispense Auth. Provider   ondansetron (ZOFRAN ODT) 4 MG disintegrating tablet Take 1 tablet (4 mg total) by mouth every 8 (eight) hours as needed for nausea or vomiting. 20 tablet Sharion Balloon, NP     PDMP not reviewed this encounter.   Sharion Balloon, NP 01/09/21 [phone]

## 2021-01-09 NOTE — Discharge Instructions (Signed)
Take the antinausea medication as directed.    Keep yourself hydrated with clear liquids, such as water, Gatorade, Pedialyte, Sprite, or ginger ale.    Your COVID and Influenza tests are pending.  You should self quarantine until the test results are back.    Follow-up with your primary care provider if your symptoms are not improving.

## 2021-01-11 ENCOUNTER — Telehealth (INDEPENDENT_AMBULATORY_CARE_PROVIDER_SITE_OTHER): Payer: Managed Care, Other (non HMO) | Admitting: Primary Care

## 2021-01-11 ENCOUNTER — Other Ambulatory Visit: Payer: Self-pay

## 2021-01-11 DIAGNOSIS — F332 Major depressive disorder, recurrent severe without psychotic features: Secondary | ICD-10-CM | POA: Diagnosis not present

## 2021-01-11 DIAGNOSIS — R197 Diarrhea, unspecified: Secondary | ICD-10-CM

## 2021-01-11 DIAGNOSIS — F411 Generalized anxiety disorder: Secondary | ICD-10-CM | POA: Diagnosis not present

## 2021-01-11 DIAGNOSIS — E1165 Type 2 diabetes mellitus with hyperglycemia: Secondary | ICD-10-CM

## 2021-01-11 LAB — COVID-19, FLU A+B NAA
Influenza A, NAA: NOT DETECTED
Influenza B, NAA: NOT DETECTED
SARS-CoV-2, NAA: NOT DETECTED

## 2021-01-11 MED ORDER — SERTRALINE HCL 50 MG PO TABS: 50.0000 mg | ORAL_TABLET | Freq: Every day | ORAL | 0 refills | Status: DC

## 2021-01-11 NOTE — Assessment & Plan Note (Signed)
Glucose readings seem improved, commended her on weight watchers and weight loss.  Repeat A1C pending. Continue Novolog 70/30, 36 units BID and trulicity 1.5 mg weekly.  Await results

## 2021-01-11 NOTE — Assessment & Plan Note (Signed)
Uncontrolled, has not tried other medications.  Stop citalopram 40 mg, start sertraline 50 mg.  Referral placed to psychiatry per patient request.  Continue to monitor.  

## 2021-01-11 NOTE — Patient Instructions (Signed)
Stop taking citalopram 40 mg.   Start taking sertraline (Zoloft) 50 mg once daily for anxiety and depression.   You will be contacted regarding your referral to psychiatry.  Please let us know if you have not been contacted within two weeks.   Come by for labs as scheduled.  It was a pleasure to see you today! Mayra Reel, NP-C

## 2021-01-11 NOTE — Assessment & Plan Note (Signed)
Uncontrolled, has not tried other medications.  Stop citalopram 40 mg, start sertraline 50 mg.  Referral placed to psychiatry per patient request.  Continue to monitor.

## 2021-01-11 NOTE — Progress Notes (Signed)
Patient ID: Joan [name], female    DOB: September 08, 1980, 40 y.o.   MRN: [id number]  Virtual visit completed through [employer], a video enabled telemedicine application. Due to national recommendations of social distancing due to COVID-19, a virtual visit is felt to be most appropriate for this patient at this time. Reviewed limitations, risks, security and privacy concerns of performing a virtual visit and the availability of in person appointments. I also reviewed that there may be a patient responsible charge related to this service. The patient agreed to proceed.   Patient location: home Provider location: [place] at [facility], office Persons participating in this virtual visit: patient, provider   If any vitals were documented, they were collected by patient at home unless specified below.    LMP 07/24/2018 (Exact Date) Comment: surgery 07/31/2018   CC: Follow up and diarrhea  Subjective:   HPI: Joan [name] is a 40 y.o. female presenting on 01/11/2021 for Follow-up (1 month- DM ), Fever (100.4-101   symptom onset 01/09/21. Took PCR and flu on 01/09/21 and got negative results ), Fatigue, and Headache  1) Type 2 Diabetes:  Current medications include: Novolog 70/30 36 units BID, Trulicity 1.5 mg weekly.   She is checking his/her blood glucose 1-2 times daily and is getting readings of 115-215, a few times in the 300's. She ran out of test strips last night.   Last A1C: 14.1 3 months ago.  Last Eye Exam: Due Last Foot Exam: Due Pneumonia Vaccination: UTD Urine Microalbumin: Losartan  Statin: Crestor   Dietary changes since last visit: She joined weight watchers about one month ago, has lost 10 pounds.    Exercise: She is not exercising.   2) Diarrhea: Also with fever, headaches, ear pain, body aches, fatigue, diarrhea with eating. Symptoms began about three days ago. Treated at urgent care two days ago, tested negative for Covid and influenza. She's been taking Tylenol  infrequently for fevers, last fever was 101 this morning.   3) GAD: Currently managed on citalopram 40 mg for which she's been taking for years. Now she feels that citalopram has been ineffective, has not tried many other medications in the past. Symptoms include tearfulness, panic attacks, feeling nervous, breaking out in sweats, worrying. She was once seeing psychiatry. Anxiety has increased since she started back to school a few weeks ago, has also noticed difficulty concentrating.        Relevant past medical, surgical, family and social history reviewed and updated as indicated. Interim medical history since our last visit reviewed. Allergies and medications reviewed and updated. Outpatient Medications Prior to Visit  Medication Sig Dispense Refill   albuterol (VENTOLIN HFA) 108 (90 Base) MCG/ACT inhaler INHALE 2 PUFFS BY MOUTH EVERY 6 HOURS AS NEEDED FOR SHORTNESS OF BREATH AND WHEEZING 8 g 0   BD VEO INSULIN SYRINGE U/F 31G X 15/64" 0.5 ML MISC USE AS DIRECTED 2 TIMES A DAY WITH INSULIN 100 each 99   blood glucose meter kit and supplies KIT Dispense based on patient and insurance preference. Use up three times daily as directed. (FOR ICD-9 250.00, 250.01). 1 each 0   Dulaglutide (TRULICITY) 1.5 ER/1.5QM SOPN Inject 1.5 mg into the skin once a week. For diabetes. 6 mL 0   insulin aspart protamine - aspart (NOVOLOG 70/30 FLEXPEN RELION) (70-30) 100 UNIT/ML FlexPen Inject 0.36 mLs (36 Units total) into the skin 2 (two) times daily with a meal. 30 mL 3   Insulin Pen Needle (PEN  NEEDLES) 31G X 6 MM MISC Use with insulin as directed. 100 each 0   losartan (COZAAR) 25 MG tablet Take 1 tablet (25 mg total) by mouth daily. For blood pressure. 90 tablet 3   montelukast (SINGULAIR) 10 MG tablet Take 10 mg by mouth at bedtime.     rosuvastatin (CRESTOR) 10 MG tablet Take 1 tablet (10 mg total) by mouth every evening. For cholesterol. 90 tablet 3   citalopram (CELEXA) 40 MG tablet Take 1 tablet (40 mg  total) by mouth daily. For anxiety. 90 tablet 0   brompheniramine-pseudoephedrine-DM 30-2-10 MG/5ML syrup Take 5 mLs by mouth 4 (four) times daily as needed. 120 mL 0   fluticasone (FLONASE) 50 MCG/ACT nasal spray Place 2 sprays into both nostrils daily. 16 g 6   Fluticasone-Umeclidin-Vilant 100-62.5-25 MCG/INH AEPB Inhale 1 puff into the lungs daily.     ipratropium (ATROVENT) 0.06 % nasal spray Place 2 sprays into both nostrils 4 (four) times daily. 15 mL 12   ondansetron (ZOFRAN ODT) 4 MG disintegrating tablet Take 1 tablet (4 mg total) by mouth every 8 (eight) hours as needed for nausea or vomiting. 20 tablet 0   predniSONE (DELTASONE) 20 MG tablet Take 2 tablets (40 mg total) by mouth daily with breakfast. 10 tablet 0   No facility-administered medications prior to visit.     Per HPI unless specifically indicated in ROS section below Review of Systems  Constitutional:  Positive for fatigue and fever.  Gastrointestinal:  Positive for diarrhea. Negative for blood in stool and vomiting.  Psychiatric/Behavioral:  The patient is nervous/anxious.   Objective:  LMP 07/24/2018 (Exact Date) Comment: surgery 07/31/2018  Wt Readings from Last 3 Encounters:  10/24/20 296 lb 8 oz (134.5 kg)  09/22/20 298 lb (135.2 kg)  08/03/20 (!) 300 lb 0.7 oz (136.1 kg)       Physical exam: Gen: alert, NAD, not ill appearing Pulm: speaks in complete sentences without increased work of breathing Psych: normal mood, normal thought content      Results for orders placed or performed during the hospital encounter of 01/09/21  Covid-19, Flu A+B ([facility])   Specimen: Nasopharyngeal   Naso  Result Value Ref Range   SARS-CoV-2, NAA Not Detected Not Detected   Influenza A, NAA Not Detected Not Detected   Influenza B, NAA Not Detected Not Detected   Assessment & Plan:   Problem List Items Addressed This Visit       Endocrine   Diabetes mellitus type 2, uncontrolled ([place]) - Primary    Glucose readings  seem improved, commended her on weight watchers and weight loss.  Repeat A1C pending. Continue Novolog 70/30, 36 units BID and trulicity 1.5 mg weekly.  Await results        Relevant Orders   Hemoglobin A1c     Other   Major depressive disorder, recurrent, severe without psychotic features ([place])   Relevant Medications   sertraline (ZOLOFT) 50 MG tablet   Other Relevant Orders   Ambulatory referral to Psychiatry   GAD (generalized anxiety disorder)   Relevant Medications   sertraline (ZOLOFT) 50 MG tablet   Other Relevant Orders   Ambulatory referral to Psychiatry   Diarrhea    Acute for the last 2-3 days. Negative Covid and flu tests.  Checking CBC and BMP. She appeared stable.   Encouraged hydration with water, Tylenol, rest.        Relevant Orders   Basic metabolic panel   CBC with Differential/Platelet  Meds ordered this encounter  Medications   sertraline (ZOLOFT) 50 MG tablet    Sig: Take 1 tablet (50 mg total) by mouth daily. For anxiety and depression    Dispense:  90 tablet    Refill:  0    Order Specific Question:   Supervising Provider    Answer:   Diona Browner, AMY E [[id number]]   Orders Placed This Encounter  Procedures   Basic metabolic panel    Standing Status:   Future    Standing Expiration Date:   01/11/2022   CBC with Differential/Platelet    Standing Status:   Future    Standing Expiration Date:   01/11/2022   Hemoglobin A1c    Standing Status:   Future    Standing Expiration Date:   01/11/2022   Ambulatory referral to Psychiatry    Referral Priority:   Routine    Referral Type:   Psychiatric    Referral Reason:   Specialty Services Required    Requested Specialty:   Psychiatry    Number of Visits Requested:   1    I discussed the assessment and treatment plan with the patient. The patient was provided an opportunity to ask questions and all were answered. The patient agreed with the plan and demonstrated an understanding of the instructions.  The patient was advised to call back or seek an in-person evaluation if the symptoms worsen or if the condition fails to improve as anticipated.  Follow up plan:  Stop taking citalopram 40 mg.   Start taking sertraline (Zoloft) 50 mg once daily for anxiety and depression.   You will be contacted regarding your referral to psychiatry.  Please let us know if you have not been contacted within two weeks.   Come by for labs as scheduled.  It was a pleasure to see you today! Allie Bossier, NP-C    Pleas Koch, NP

## 2021-01-11 NOTE — Assessment & Plan Note (Signed)
Acute for the last 2-3 days. Negative Covid and flu tests.  Checking CBC and BMP. She appeared stable.   Encouraged hydration with water, Tylenol, rest.

## 2021-01-12 ENCOUNTER — Other Ambulatory Visit (INDEPENDENT_AMBULATORY_CARE_PROVIDER_SITE_OTHER): Payer: Managed Care, Other (non HMO)

## 2021-01-12 DIAGNOSIS — E1165 Type 2 diabetes mellitus with hyperglycemia: Secondary | ICD-10-CM

## 2021-01-12 DIAGNOSIS — R197 Diarrhea, unspecified: Secondary | ICD-10-CM

## 2021-01-12 NOTE — Addendum Note (Signed)
Addended by: Alvina Chou on: 01/12/2021 03:00 PM   Modules accepted: Orders

## 2021-01-13 LAB — BASIC METABOLIC PANEL
BUN: 11 mg/dL (ref 7–25)
CO2: 25 mmol/L (ref 20–32)
Calcium: 9.3 mg/dL (ref 8.6–10.2)
Chloride: 99 mmol/L (ref 98–110)
Creat: 0.76 mg/dL (ref 0.50–1.10)
Glucose, Bld: 380 mg/dL — ABNORMAL HIGH (ref 65–99)
Potassium: 4.9 mmol/L (ref 3.5–5.3)
Sodium: 134 mmol/L — ABNORMAL LOW (ref 135–146)

## 2021-01-13 LAB — CBC WITH DIFFERENTIAL/PLATELET
Absolute Monocytes: 458 cells/uL (ref 200–950)
Basophils Absolute: 23 cells/uL (ref 0–200)
Basophils Relative: 0.3 %
Eosinophils Absolute: 128 cells/uL (ref 15–500)
Eosinophils Relative: 1.7 %
HCT: 43.6 % (ref 35.0–45.0)
Hemoglobin: 14.2 g/dL (ref 11.7–15.5)
Lymphs Abs: 1943 cells/uL (ref 850–3900)
MCH: 27.9 pg (ref 27.0–33.0)
MCHC: 32.6 g/dL (ref 32.0–36.0)
MCV: 85.7 fL (ref 80.0–100.0)
MPV: 11.5 fL (ref 7.5–12.5)
Monocytes Relative: 6.1 %
Neutro Abs: 4950 cells/uL (ref 1500–7800)
Neutrophils Relative %: 66 %
Platelets: 272 10*3/uL (ref 140–400)
RBC: 5.09 10*6/uL (ref 3.80–5.10)
RDW: 13.1 % (ref 11.0–15.0)
Total Lymphocyte: 25.9 %
WBC: 7.5 10*3/uL (ref 3.8–10.8)

## 2021-01-13 LAB — HEMOGLOBIN A1C
Hgb A1c MFr Bld: 12 % of total Hgb — ABNORMAL HIGH (ref <5.7)
Mean Plasma Glucose: 298 mg/dL
eAG (mmol/L): 16.5 mmol/L

## 2021-01-17 MED ORDER — NOVOLOG 70/30 FLEXPEN RELION (70-30) 100 UNIT/ML ~~LOC~~ SUPN: PEN_INJECTOR | SUBCUTANEOUS | 3 refills | Status: DC

## 2021-01-17 MED ORDER — TRULICITY 3 MG/0.5ML ~~LOC~~ SOAJ: 3.0000 mg | SUBCUTANEOUS | 0 refills | Status: DC

## 2021-01-24 ENCOUNTER — Telehealth: Payer: Self-pay

## 2021-01-24 NOTE — Telephone Encounter (Signed)
Noted, will evaluate. 

## 2021-01-24 NOTE — Telephone Encounter (Signed)
I spoke with pt; pt said has not been cking BS recently; pt said ins would not approve both insulins trulicity and novolog 70/30. Pt said she thinks she is getting migraines., today woke up with H/A right behind eyes and forehead; pain level 7. Pt said on and off has dizziness with room spinning and other times lightheaded. No other covid symptoms and no known exposures to covid; pt had both covid vaccines. Pt will ck BS between now and appt with Allayne Gitelman NP on 01/26/21. UC & ED precautions given and pt voiced understanding. Sending note to Allayne Gitelman NP and [facility] CMA. Pt has been pt messaging with Allayne Gitelman NP see 12/28/20 pt message.

## 2021-01-26 ENCOUNTER — Telehealth: Payer: Self-pay

## 2021-01-26 ENCOUNTER — Encounter: Payer: Self-pay | Admitting: Primary Care

## 2021-01-26 ENCOUNTER — Other Ambulatory Visit: Payer: Self-pay | Admitting: Primary Care

## 2021-01-26 ENCOUNTER — Other Ambulatory Visit: Payer: Self-pay

## 2021-01-26 ENCOUNTER — Ambulatory Visit (INDEPENDENT_AMBULATORY_CARE_PROVIDER_SITE_OTHER): Payer: Managed Care, Other (non HMO) | Admitting: Primary Care

## 2021-01-26 VITALS — BP 118/74 | HR 103 | Temp 97.6°F | Ht 66.0 in | Wt 294.0 lb

## 2021-01-26 DIAGNOSIS — F332 Major depressive disorder, recurrent severe without psychotic features: Secondary | ICD-10-CM

## 2021-01-26 DIAGNOSIS — R229 Localized swelling, mass and lump, unspecified: Secondary | ICD-10-CM | POA: Insufficient documentation

## 2021-01-26 DIAGNOSIS — F411 Generalized anxiety disorder: Secondary | ICD-10-CM

## 2021-01-26 DIAGNOSIS — G43909 Migraine, unspecified, not intractable, without status migrainosus: Secondary | ICD-10-CM | POA: Insufficient documentation

## 2021-01-26 DIAGNOSIS — Z1231 Encounter for screening mammogram for malignant neoplasm of breast: Secondary | ICD-10-CM | POA: Diagnosis not present

## 2021-01-26 DIAGNOSIS — R519 Headache, unspecified: Secondary | ICD-10-CM | POA: Diagnosis not present

## 2021-01-26 HISTORY — DX: Localized swelling, mass and lump, unspecified: R22.9

## 2021-01-26 MED ORDER — KETOROLAC TROMETHAMINE 60 MG/2ML IM SOLN
60.0000 mg | Freq: Once | INTRAMUSCULAR | Status: AC
  Administered 2021-01-26: 60 mg via INTRAMUSCULAR

## 2021-01-26 NOTE — Telephone Encounter (Signed)
Pt declined the auth on insulin will pay out of pocket. We would have to change the insulin to get covered and did not want to do that at this time.

## 2021-01-26 NOTE — Telephone Encounter (Signed)
Trulicity  Joan [name] Key: [id number] - PA Case ID: [id number] Need help? Call us at [phone] Status Sent to [facility] Trulicity 3MG /0.5ML pen-injectors Form PA Form [phone] NCPDP)

## 2021-01-26 NOTE — Addendum Note (Signed)
Addended by: Doreene Nest on: 01/26/2021 03:38 PM   Modules accepted: Orders

## 2021-01-26 NOTE — Progress Notes (Signed)
Subjective:    Patient ID: Joan [name], female    DOB: 04-May-1981, 40 y.o.   MRN: [id number]  HPI  Joan [name] is a very pleasant 40 y.o. female with a history of hypertension, recurrent URI, uncontrolled type 2 diabetes, asthma, PTSD, Depression, who presents today to discuss headache and skin mass  Located to the bilateral frontal lobes that was intermittent but recently constant, initially began 2 weeks ago. She does have photophobia with nausea at times with her headaches. No prior history of migraines. She's been taking Tylenol without improvement.   She is checking her glucose readings which are running 160's-mid 200's. She endorses drinking a lot of caffeine, tried drinking only water without improvement.   She first noticed her skin mass about 2-3 months ago which is located behind her right ear. She was told by dermatology that the mass was a lymph node that was reacting because of the skin infection she had on her scalp.  She's been on several rounds of antibiotics for various issues without improvement. The dermatology appointment was months ago and her skin infection has healed. She denies fevers, chills, cough.   BP Readings from Last 3 Encounters:  01/26/21 118/74  01/09/21 109/77  10/24/20 118/78      Review of Systems  Eyes:  Positive for photophobia.  Skin:  Negative for color change.  Neurological:  Positive for headaches.  Hematological:  Positive for adenopathy.        Past Medical History:  Diagnosis Date   Acute asthma exacerbation 04/10/2018   Acute non-recurrent maxillary sinusitis 09/25/2018   Asthma    Auditory hallucinations    only after anesthesia   BV (bacterial vaginosis) 02/13/2018   Diabetes mellitus without complication (HCC)    diet controlled   Diabetes mellitus, type II (HCC)    insulin, jardiance   Dyspnea    with exertion   Essential hypertension    Essential hypertension 11/24/2017   GERD (gastroesophageal reflux disease)     occasionally-NO MEDS   History of placement of ear tubes 05/20/2018   Hyperlipidemia    MDD (major depressive disorder)    Miscarriage    Nausea & vomiting 10/05/2019   PTSD (post-traumatic stress disorder)    Right lower quadrant abdominal pain 02/13/2018   Tachycardia     Social History   Socioeconomic History   Marital status: Married    Spouse name: chip   Number of children: 0   Years of education: Not on file   Highest education level: High school graduate  Occupational History   Occupation: day care worker  Tobacco Use   Smoking status: Never   Smokeless tobacco: Never  Vaping Use   Vaping Use: Never used  Substance and Sexual Activity   Alcohol use: No   Drug use: No   Sexual activity: Yes  Other Topics Concern   Not on file  Social History Narrative   Not on file   Social Determinants of Health   Financial Resource Strain: Not on file  Food Insecurity: Not on file  Transportation Needs: Not on file  Physical Activity: Not on file  Stress: Not on file  Social Connections: Not on file  Intimate Partner Violence: Not on file    Past Surgical History:  Procedure Laterality Date   ABDOMINAL HYSTERECTOMY     ADENOIDECTOMY     CHOLECYSTECTOMY  2009   CYSTOSCOPY N/A 07/31/2018   Procedure: CYSTOSCOPY;  Surgeon: Benjaman Kindler, MD;  Location:  [facility] ORS;  Service: Gynecology;  Laterality: N/A;   LAPAROSCOPIC BILATERAL SALPINGECTOMY Bilateral 07/31/2018   Procedure: LAPAROSCOPIC BILATERAL SALPINGECTOMY;  Surgeon: Benjaman Kindler, MD;  Location: [facility] ORS;  Service: Gynecology;  Laterality: Bilateral;   LAPAROSCOPIC HYSTERECTOMY N/A 07/31/2018   Procedure: HYSTERECTOMY TOTAL LAPAROSCOPIC;  Surgeon: Benjaman Kindler, MD;  Location: [facility] ORS;  Service: Gynecology;  Laterality: N/A;   LAPAROSCOPY N/A 06/07/2019   Procedure: LAPAROSCOPY OPERATIVE, WITH PERITONEAL BIOPSIES;  Surgeon: Benjaman Kindler, MD;  Location: [facility] OR[facility];  Service: Gynecology;  Laterality: N/A;    LYSIS OF ADHESION N/A 07/31/2018   Procedure: LYSIS OF ADHESION;  Surgeon: Benjaman Kindler, MD;  Location: [facility] OR[facility];  Service: Gynecology;  Laterality: N/A;   OVARY SURGERY Right    cyst removed a while ago   TONSILLECTOMY     tubes in ear      Family History  Problem Relation Age of Onset   Asthma Mother    Diabetes Mother    Hyperlipidemia Mother    Hypertension Mother    Diabetes Father    Hyperlipidemia Father    Hypertension Father    Diabetes Brother    Depression Brother    Alcohol abuse Brother    Depression Maternal Grandmother    Stroke Paternal Grandmother    Breast cancer Neg Hx     Allergies  Allergen Reactions   Ibuprofen Swelling    Facial    Ciprofloxacin Hives and Rash   Metformin And Related Other (See Comments)    Elevated Lactic Acid   Penicillins Hives and Rash    Has patient had a PCN reaction causing immediate rash, facial/tongue/throat swelling, SOB or lightheadedness with hypotension: No Has patient had a PCN reaction causing severe rash involving mucus membranes or skin necrosis: No Has patient had a PCN reaction that required hospitalization: No Has patient had a PCN reaction occurring within the last 10 years: No If all of the above answers are "NO", then may proceed with Cephalosporin use. THE PATIENT IS ABLE TO TOLERATE CEPHALOSPORINS WITHOUT DIFFIC   Sulfa Antibiotics Hives and Rash   Other     ALL NUTS-SCRATCHY THROAT   Shellfish Allergy Other (See Comments)    ALL SEAFOOD-SCRATCHY THROAT    Current Outpatient Medications on File Prior to Visit  Medication Sig Dispense Refill   albuterol (VENTOLIN HFA) 108 (90 Base) MCG/ACT inhaler INHALE 2 PUFFS BY MOUTH EVERY 6 HOURS AS NEEDED FOR SHORTNESS OF BREATH AND WHEEZING 8 g 0   BD VEO INSULIN SYRINGE U/F 31G X 15/64" 0.5 ML MISC USE AS DIRECTED 2 TIMES A DAY WITH INSULIN 100 each 99   blood glucose meter kit and supplies KIT Dispense based on patient and insurance preference. Use up three  times daily as directed. (FOR ICD-9 250.00, 250.01). 1 each 0   Dulaglutide (TRULICITY) 3 WN/0.2VO SOPN Inject 3 mg as directed once a week. For diabetes. 6 mL 0   insulin aspart protamine - aspart (NOVOLOG 70/30 FLEXPEN RELION) (70-30) 100 UNIT/ML FlexPen Inject 40 units into the skin every morning and 36 units every evening for diabetes. 30 mL 3   Insulin Pen Needle (PEN NEEDLES) 31G X 6 MM MISC Use with insulin as directed. 100 each 0   losartan (COZAAR) 25 MG tablet Take 1 tablet (25 mg total) by mouth daily. For blood pressure. 90 tablet 3   montelukast (SINGULAIR) 10 MG tablet Take 10 mg by mouth at bedtime.     rosuvastatin (CRESTOR) 10 MG tablet Take  1 tablet (10 mg total) by mouth every evening. For cholesterol. 90 tablet 3   sertraline (ZOLOFT) 50 MG tablet Take 1 tablet (50 mg total) by mouth daily. For anxiety and depression 90 tablet 0   No current facility-administered medications on file prior to visit.    BP 118/74   Pulse (!) 103   Temp 97.6 F (36.4 C) (Temporal)   Ht _0  (1.676 m)   Wt 294 lb (133.4 kg)   LMP 07/24/2018 (Exact Date) Comment: surgery 07/31/2018  SpO2 98%   BMI 47.45 kg/m  Objective:   Physical Exam Neck:     Comments: No abnormality noted behind right or left ear. No lymph node enlargement noted to cervical chain.  Pulmonary:     Effort: Pulmonary effort is normal.  Musculoskeletal:     Cervical back: Neck supple.  Lymphadenopathy:     Cervical: No cervical adenopathy.  Skin:    General: Skin is warm and dry.  Neurological:     Mental Status: She is alert and oriented to person, place, and time.     Cranial Nerves: No cranial nerve deficit.          Assessment & Plan:      This visit occurred during the SARS-CoV-2 public health emergency.  Safety protocols were in place, including screening questions prior to the visit, additional usage of staff PPE, and extensive cleaning of exam room while observing appropriate contact time as  indicated for disinfecting solutions.

## 2021-01-26 NOTE — Patient Instructions (Signed)
You will be contacted regarding your ultrasound.  Please let us know if you have not been contacted within two weeks.   You can try Excedrin Migraine.  Let me know if your headaches persist.   It was a pleasure to see you today!

## 2021-01-26 NOTE — Addendum Note (Signed)
Addended by: Donnamarie Poag on: 01/26/2021 04:02 PM   Modules accepted: Orders

## 2021-01-26 NOTE — Assessment & Plan Note (Signed)
No obvious mass or swelling on exam. Ultrasound ordered.

## 2021-01-26 NOTE — Assessment & Plan Note (Signed)
Acute for 2 weeks with photophobia. No alarm signs.  IM Toradol 60 mg provided today. She will update next week if headaches persist.   Consider sumatriptan. Discussed Excedrin Migraine.  Consider imaging if warranted.

## 2021-01-30 ENCOUNTER — Telehealth: Payer: Self-pay | Admitting: Primary Care

## 2021-01-30 DIAGNOSIS — R519 Headache, unspecified: Secondary | ICD-10-CM

## 2021-01-30 MED ORDER — SUMATRIPTAN SUCCINATE 50 MG PO TABS: ORAL_TABLET | ORAL | 0 refills | Status: DC

## 2021-01-30 NOTE — Telephone Encounter (Signed)
Spoke to pt asked her If she has not noticed any improvement in headaches after the shot last week? Pt said no, it took hours for it to kick and then only made it dull and was back by morning. Okay then she recommend we obtain a CT scan of her head but also try some migraine abortive medication.  My recommendation would be sumatriptan 50 mg.  She will take 1 tablet by mouth when she is home, may repeat with a second dose 2 hours later if her headache persists.  She is not to exceed 2 tablets in 24 hours. Pt verbalized understanding and said agreeable to CT scan and trying the medication. Told her once you start the medication will need for her to update me in 2 to 3 days. Pt verbalized understanding. Told pt Jae Dire has gone for the day but will send her message that you are agreeable to CT scan and trying medication and someone will be in touch to schedule CT scan appt. Pt verbalized understanding.

## 2021-01-30 NOTE — Telephone Encounter (Signed)
If she has not noticed any improvement in headaches after the shot last week, then I recommend we obtain a CT scan of her head but also try some migraine abortive medication.  My recommendation would be sumatriptan 50 mg.  She will take 1 tablet by mouth when she is home, may repeat with a second dose 2 hours later if her headache persists.  She is not to exceed 2 tablets in 24 hours.  I will need for her to update me in 2 to 3 days.  Please notify me if she is agreeable to the CT scan and the sumatriptan medication, then I will order both.

## 2021-01-30 NOTE — Telephone Encounter (Signed)
Joellen:  Can you find out what is going on with the sumatriptan (Imitrex) medication that I sent to the pharmacy for her headache?  See her MyChart message.

## 2021-01-30 NOTE — Telephone Encounter (Signed)
Noted, prescription for sumatriptan 50 mg sent to pharmacy.  CT head ordered.

## 2021-01-30 NOTE — Telephone Encounter (Signed)
Joan [name], pt is agreeable to CT scan and trying Sumatriptan 50 mg tablet.

## 2021-01-30 NOTE — Telephone Encounter (Signed)
Pt called stating her head has not gotten any better and it is making her sick. Please advise on next steps.

## 2021-01-31 ENCOUNTER — Emergency Department: Payer: Managed Care, Other (non HMO)

## 2021-01-31 ENCOUNTER — Other Ambulatory Visit: Payer: Self-pay

## 2021-01-31 ENCOUNTER — Emergency Department
Admission: EM | Admit: 2021-01-31 | Discharge: 2021-01-31 | Disposition: A | Discharge status: Home / Self Care | Payer: Managed Care, Other (non HMO) | Attending: Emergency Medicine | Admitting: Emergency Medicine

## 2021-01-31 DIAGNOSIS — R110 Nausea: Secondary | ICD-10-CM | POA: Insufficient documentation

## 2021-01-31 DIAGNOSIS — R519 Headache, unspecified: Secondary | ICD-10-CM | POA: Insufficient documentation

## 2021-01-31 DIAGNOSIS — I10 Essential (primary) hypertension: Secondary | ICD-10-CM | POA: Insufficient documentation

## 2021-01-31 DIAGNOSIS — Z79899 Other long term (current) drug therapy: Secondary | ICD-10-CM | POA: Insufficient documentation

## 2021-01-31 DIAGNOSIS — R42 Dizziness and giddiness: Secondary | ICD-10-CM | POA: Diagnosis not present

## 2021-01-31 DIAGNOSIS — E119 Type 2 diabetes mellitus without complications: Secondary | ICD-10-CM | POA: Diagnosis not present

## 2021-01-31 DIAGNOSIS — Z794 Long term (current) use of insulin: Secondary | ICD-10-CM | POA: Insufficient documentation

## 2021-01-31 DIAGNOSIS — Z8616 Personal history of COVID-19: Secondary | ICD-10-CM | POA: Insufficient documentation

## 2021-01-31 DIAGNOSIS — J45909 Unspecified asthma, uncomplicated: Secondary | ICD-10-CM | POA: Insufficient documentation

## 2021-01-31 MED ORDER — BUTALBITAL-APAP-CAFFEINE 50-325-40 MG PO TABS: 1.0000 | ORAL_TABLET | Freq: Four times a day (QID) | ORAL | 0 refills | Status: DC | PRN

## 2021-01-31 MED ORDER — ONDANSETRON 4 MG PO TBDP
4.0000 mg | ORAL_TABLET | Freq: Once | ORAL | Status: AC
  Administered 2021-01-31: 4 mg via ORAL
  Filled 2021-01-31: qty 1

## 2021-01-31 NOTE — ED Provider Notes (Signed)
[facility] Emergency Department Provider Note  ____________________________________________   Event Date/Time   First MD Initiated Contact with Patient 01/31/21 [phone]     (approximate)  I have reviewed the triage vital signs and the nursing notes.   HISTORY  Chief Complaint Headache    HPI Joan [name] is a 40 y.o. female presents to the ED with complaint of generalized headache occasionally localizes to the right side of her head.  She has had this headache for approximately 3 weeks.  Patient also has had some dizziness and currently has some nausea.  Patient denies any previous history of migraine headaches.  She was seen by her PCP on 01/26/2021 at which time she was given a Toradol injection which she states helped for short period time and then her headache returned.  Patient states that she has not had a work-up done for "migraines".  Currently she rates her pain as 9 out of 10 but was able to drive herself to the emergency department this morning.         Past Medical History:  Diagnosis Date   Acute asthma exacerbation 04/10/2018   Acute non-recurrent maxillary sinusitis 09/25/2018   Asthma    Auditory hallucinations    only after anesthesia   BV (bacterial vaginosis) 02/13/2018   Diabetes mellitus without complication ([place])    diet controlled   Diabetes mellitus, type II ([place])    insulin, jardiance   Dyspnea    with exertion   Essential hypertension    Essential hypertension 11/24/2017   GERD (gastroesophageal reflux disease)    occasionally-NO MEDS   History of placement of ear tubes 05/20/2018   Hyperlipidemia    MDD (major depressive disorder)    Miscarriage    Nausea & vomiting 10/05/2019   PTSD (post-traumatic stress disorder)    Right lower quadrant abdominal pain 02/13/2018   Tachycardia     Patient Active Problem List   Diagnosis Date Noted   New onset of headaches 01/26/2021   Localized skin mass, lump, or swelling  01/26/2021   Diarrhea 01/11/2021   GAD (generalized anxiety disorder) 10/24/2020   Non-intractable vomiting 10/02/2020   Asthma exacerbation 08/03/2020   Acute URI 07/26/2020   COVID-19 virus infection 08/16/2019   Kidney stone 02/11/2019   Asthma 11/03/2018   Recurrent sinusitis 05/20/2018   Recurrent fever of unknown etiology 05/08/2018   Recurrent URI (upper respiratory infection) 05/08/2018   Tachycardia 05/04/2018   Missed period 04/29/2018   Diabetes mellitus type 2, uncontrolled ([place]) 11/24/2017   Essential hypertension 11/24/2017   Hyperlipidemia 11/24/2017   Major depressive disorder, recurrent, severe without psychotic features ([place])    PTSD (post-traumatic stress disorder) 04/11/2015    Past Surgical History:  Procedure Laterality Date   ABDOMINAL HYSTERECTOMY     ADENOIDECTOMY     CHOLECYSTECTOMY  2009   CYSTOSCOPY N/A 07/31/2018   Procedure: CYSTOSCOPY;  Surgeon: Benjaman Kindler, MD;  Location: [facility] ORS;  Service: Gynecology;  Laterality: N/A;   LAPAROSCOPIC BILATERAL SALPINGECTOMY Bilateral 07/31/2018   Procedure: LAPAROSCOPIC BILATERAL SALPINGECTOMY;  Surgeon: Benjaman Kindler, MD;  Location: [facility] ORS;  Service: Gynecology;  Laterality: Bilateral;   LAPAROSCOPIC HYSTERECTOMY N/A 07/31/2018   Procedure: HYSTERECTOMY TOTAL LAPAROSCOPIC;  Surgeon: Benjaman Kindler, MD;  Location: [facility] ORS;  Service: Gynecology;  Laterality: N/A;   LAPAROSCOPY N/A 06/07/2019   Procedure: LAPAROSCOPY OPERATIVE, WITH PERITONEAL BIOPSIES;  Surgeon: Benjaman Kindler, MD;  Location: [facility] ORS;  Service: Gynecology;  Laterality: N/A;   LYSIS OF  ADHESION N/A 07/31/2018   Procedure: LYSIS OF ADHESION;  Surgeon: Benjaman Kindler, MD;  Location: [facility] ORS;  Service: Gynecology;  Laterality: N/A;   OVARY SURGERY Right    cyst removed a while ago   TONSILLECTOMY     tubes in ear      Prior to Admission medications   Medication Sig Start Date End Date Taking? Authorizing Provider   butalbital-acetaminophen-caffeine (FIORICET) [phone] MG tablet Take 1-2 tablets by mouth every 6 (six) hours as needed for headache. 01/31/21 01/31/22 Yes Jarreau Callanan L, PA-C  albuterol (VENTOLIN HFA) 108 (90 Base) MCG/ACT inhaler INHALE 2 PUFFS BY MOUTH EVERY 6 HOURS AS NEEDED FOR SHORTNESS OF BREATH AND WHEEZING 10/10/20   Pleas Koch, NP  BD VEO INSULIN SYRINGE U/F 31G X 15/64" 0.5 ML MISC USE AS DIRECTED 2 TIMES A DAY WITH INSULIN 08/08/20 08/08/21    blood glucose meter kit and supplies KIT Dispense based on patient and insurance preference. Use up three times daily as directed. (FOR ICD-9 250.00, 250.01). 10/03/20   Lucrezia Starch, MD  Dulaglutide (TRULICITY) 3 XT/0.6YI SOPN Inject 3 mg as directed once a week. For diabetes. 01/17/21   Pleas Koch, NP  insulin aspart protamine - aspart (NOVOLOG 70/30 FLEXPEN RELION) (70-30) 100 UNIT/ML FlexPen Inject 40 units into the skin every morning and 36 units every evening for diabetes. 01/17/21   Pleas Koch, NP  Insulin Pen Needle (PEN NEEDLES) 31G X 6 MM MISC Use with insulin as directed. 10/03/20   Lucrezia Starch, MD  losartan (COZAAR) 25 MG tablet Take 1 tablet (25 mg total) by mouth daily. For blood pressure. 02/08/20   Pleas Koch, NP  montelukast (SINGULAIR) 10 MG tablet Take 10 mg by mouth at bedtime.    [provider]  rosuvastatin (CRESTOR) 10 MG tablet Take 1 tablet (10 mg total) by mouth every evening. For cholesterol. 10/24/20   Pleas Koch, NP  sertraline (ZOLOFT) 50 MG tablet Take 1 tablet (50 mg total) by mouth daily. For anxiety and depression 01/11/21   Pleas Koch, NP  SUMAtriptan (IMITREX) 50 MG tablet Take 1 tablet at migraine onset. May repeat in 2 hours if headache persists or recurs. Do not exceed 2 tablets in 24 hours. 01/30/21   Pleas Koch, NP    Allergies Ibuprofen, Ciprofloxacin, Metformin and related, Penicillins, Sulfa antibiotics, Other, and Shellfish allergy  Family  History  Problem Relation Age of Onset   Asthma Mother    Diabetes Mother    Hyperlipidemia Mother    Hypertension Mother    Diabetes Father    Hyperlipidemia Father    Hypertension Father    Diabetes Brother    Depression Brother    Alcohol abuse Brother    Depression Maternal Grandmother    Stroke Paternal Grandmother    Breast cancer Neg Hx     Social History Social History   Tobacco Use   Smoking status: Never   Smokeless tobacco: Never  Vaping Use   Vaping Use: Never used  Substance Use Topics   Alcohol use: No   Drug use: No    Review of Systems Constitutional: No fever/chills Eyes: No visual changes. ENT: No sore throat. Cardiovascular: Denies chest pain. Respiratory: Denies shortness of breath. Gastrointestinal: No abdominal pain.  No nausea, no vomiting.  No diarrhea.   Genitourinary: Negative for dysuria. Musculoskeletal: Negative for musculoskeletal pain. Skin: Negative for rash. Neurological: Positive for headache.  Negative for focal weakness  or numbness.  ____________________________________________   PHYSICAL EXAM:  VITAL SIGNS: ED Triage Vitals  Enc Vitals Group     BP 01/31/21 0729 (!) 143/91     Pulse Rate 01/31/21 0724 82     Resp 01/31/21 0724 18     Temp 01/31/21 0724 97.8 F (36.6 C)     Temp Source 01/31/21 0724 Oral     SpO2 01/31/21 0724 97 %     Weight 01/31/21 0725 295 lb (133.8 kg)     Height 01/31/21 0725 '5\' 6"'  (1.676 m)     Head Circumference --      Peak Flow --      Pain Score 01/31/21 0724 9     Pain Loc --      Pain Edu? --      Excl. in [place]? --     Constitutional: Alert and oriented. Well appearing and in no acute distress. Eyes: Conjunctivae are normal. PERRL. EOMI. Head: Atraumatic. Nose: No congestion/rhinnorhea. Mouth/Throat: Mucous membranes are moist.  Oropharynx non-erythematous. Neck: No stridor.  No cervical tenderness on palpation posteriorly.  Range of motion is without  restriction. Hematological/Lymphatic/Immunilogical: No cervical lymphadenopathy. Cardiovascular: Normal rate, regular rhythm. Grossly normal heart sounds.  Good peripheral circulation. Respiratory: Normal respiratory effort.  No retractions. Lungs CTAB. Gastrointestinal: Soft and nontender. No distention.  Musculoskeletal: No lower extremity tenderness nor edema.  No joint effusions.  Good muscle strength bilaterally.  Normal gait was noted. Neurologic:  Normal speech and language. No gross focal neurologic deficits are appreciated.  Cranial nerves II through XII grossly intact.  No gait instability. Skin:  Skin is warm, dry and intact. No rash noted. Psychiatric: Mood and affect are normal. Speech and behavior are normal.  ____________________________________________   LABS (all labs ordered are listed, but only abnormal results are displayed)  Labs Reviewed - No data to display ____________________________________________  ____________________________________________  RADIOLOGY Leana Gamer, personally viewed and evaluated these images (plain radiographs) as part of my medical decision making, as well as reviewing the written report by the radiologist.    Official radiology report(s): CT Head Wo Contrast  Result Date: 01/31/2021 CLINICAL DATA:  40 year old female with history of headache. EXAM: CT HEAD WITHOUT CONTRAST TECHNIQUE: Contiguous axial images were obtained from the base of the skull through the vertex without intravenous contrast. COMPARISON:  05/10/2018 FINDINGS: Brain: No evidence of acute infarction, hemorrhage, hydrocephalus, extra-axial collection or mass lesion/mass effect. Vascular: No hyperdense vessel or unexpected calcification. Skull: Normal. Negative for fracture or focal lesion. Sinuses/Orbits: No acute finding. Other: None. IMPRESSION: No acute intracranial abnormality. Electronically Signed   By: Ruthann Cancer MD   On: 01/31/2021 08:51     ____________________________________________   PROCEDURES  Procedure(s) performed (including Critical Care):  Procedures   ____________________________________________   INITIAL IMPRESSION / ASSESSMENT AND PLAN / ED COURSE  As part of my medical decision making, I reviewed the following data within the electronic MEDICAL RECORD NUMBER Notes from prior ED visits and [place] Controlled Substance Database  40 year old female presents to the ED with complaint of generalized headache for the last 3 weeks that is noticed occasionally more on the right side than the left.  Patient was given a Toradol injection while being seen in her PCPs office which helped with her headache but returned afterwards.  At has had some occasional dizziness with nausea but no aura or head injury.  CT scan was reassuring and patient was made aware.  Patient was given Zofran while  in the ED which resolved her nausea.  Patient drove herself to the emergency department therefore her medication was sent to the pharmacy.  Patient was agreeable with this.  A prescription for Fioricet 1 or 2 every 6 hours was sent to her pharmacy.  Patient was made aware that she cannot drive or operate machinery while taking this medication as it could cause drowsiness.  She is to follow-up with her PCP if any continued problems.  ____________________________________________   FINAL CLINICAL IMPRESSION(S) / ED DIAGNOSES  Final diagnoses:  Acute nonintractable headache, unspecified headache type     ED Discharge Orders          Ordered    butalbital-acetaminophen-caffeine (FIORICET) 50-325-40 MG tablet  Every 6 hours PRN        01/31/21 0904             Note:  This document was prepared using Dragon voice recognition software and may include unintentional dictation errors.    Johnn Hai, PA-C 01/31/21 1445    Harvest Dark, MD 01/31/21 1520

## 2021-01-31 NOTE — ED Triage Notes (Signed)
Pt comes into the ED via POV c/o headache.  Pt states she has h/o extensive headaches but has yet to be diagnosed with migraines.  Pt denies having tried any  migraine medication yet at this time.  Pt neurologically intact at this time with even and unlabored respirations.

## 2021-01-31 NOTE — Discharge Instructions (Addendum)
Follow-up with your primary care provider if any continued problems or not improving.  Return to the emergency department if any worsening of your headache.  A prescription medication was sent to your pharmacy.  This is 1 or 2 tablets every 6 hours as needed for headache.  Be aware that this medication could cause drowsiness so therefore do not operate machinery or drive while taking this medication.

## 2021-02-28 IMAGING — CT CT ABDOMEN AND PELVIS WITH CONTRAST
1 of 2 series · 14 of 32 positions shown, 18 images · IV contrast (APPLIED)
Comparison: CT abdomen and pelvis August 14, 2018

CLINICAL DATA: Right lower quadrant region pain

EXAM:
CT ABDOMEN AND PELVIS WITH CONTRAST
TECHNIQUE: Multidetector CT imaging of the abdomen and pelvis was performed
using the standard protocol following bolus administration of
intravenous contrast.
CONTRAST:  100mL [id number] IOPAMIDOL ([id number]) INJECTION 61%

[Series 2: abd/pelvis w/cm · axial · 0.96mm/px · z∈[-447,+3]mm · 14 of 100 slices shown, 18 images]
[im 5/100  soft-tissue]
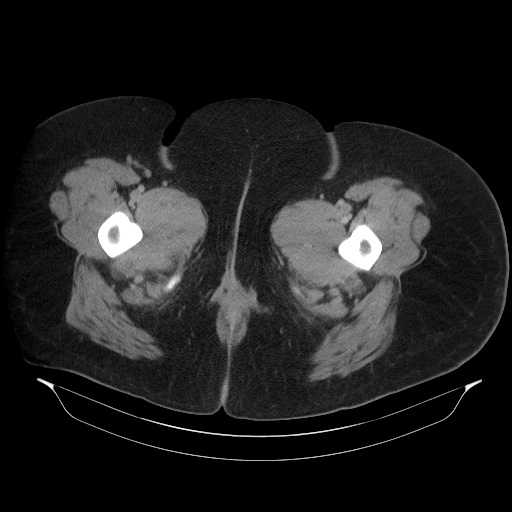
[im 5/100  bone]
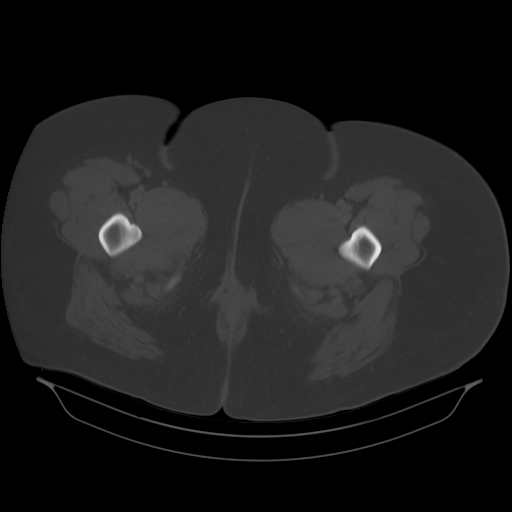
[im 15/100  soft-tissue]
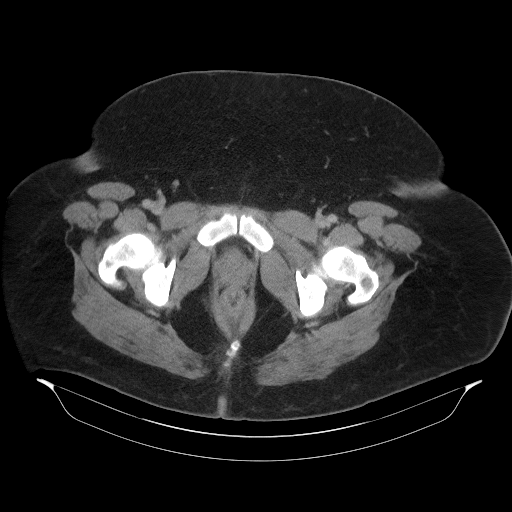
[im 24/100  soft-tissue]
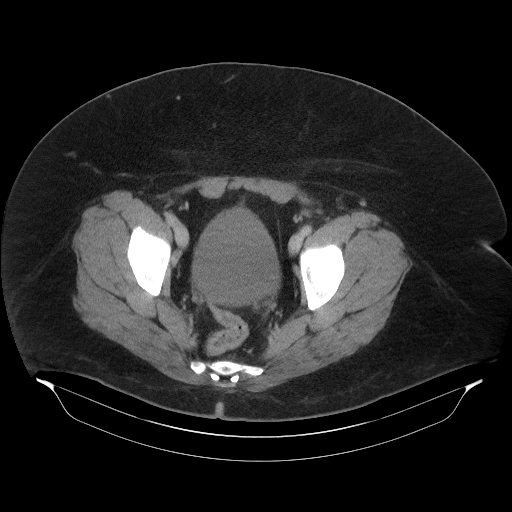
[im 29/100  soft-tissue]
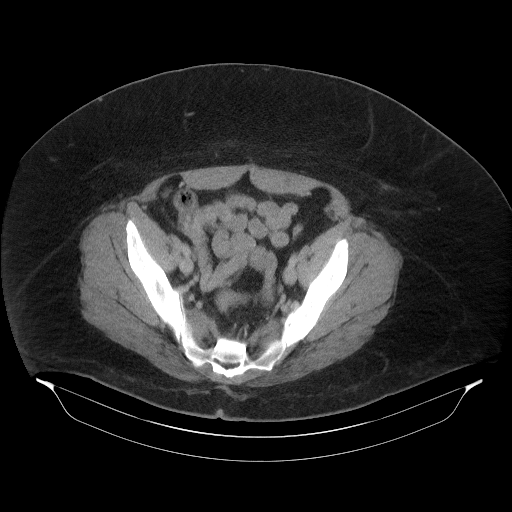
[im 38/100  soft-tissue]
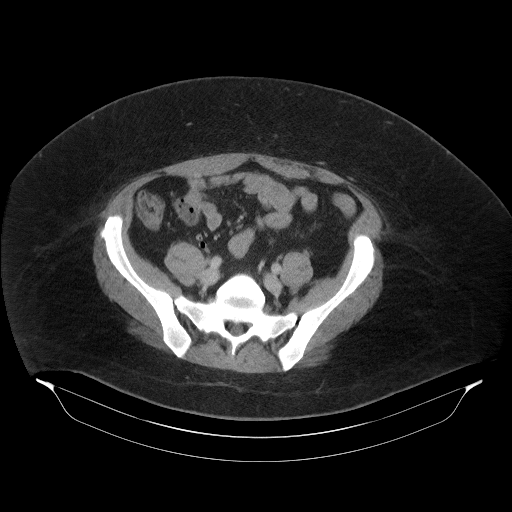
[im 48/100  soft-tissue]
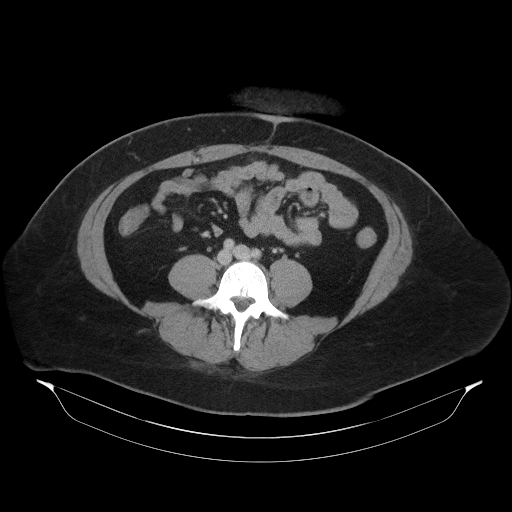
[im 52/100  soft-tissue]
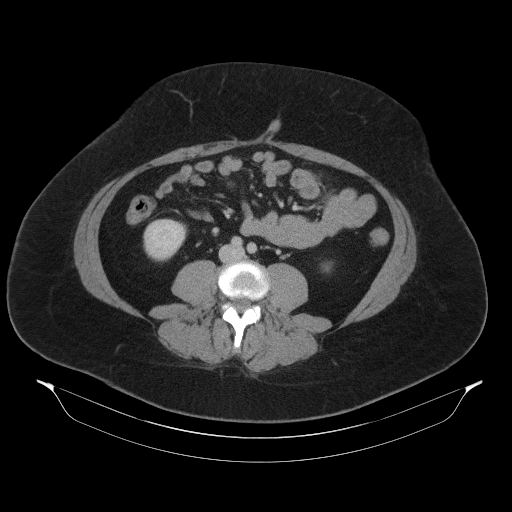
[im 62/100  soft-tissue]
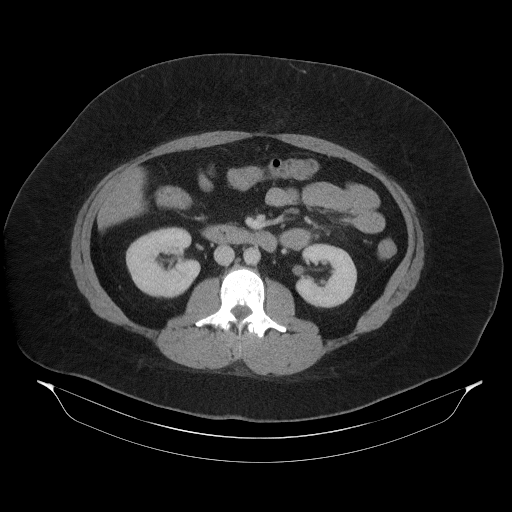
[im 71/100  soft-tissue]
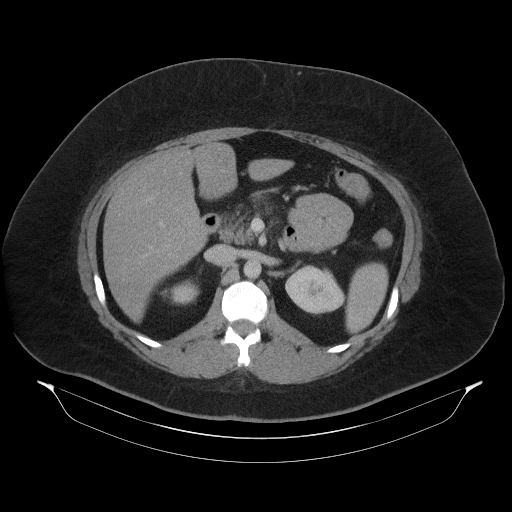
[im 71/100  bone]
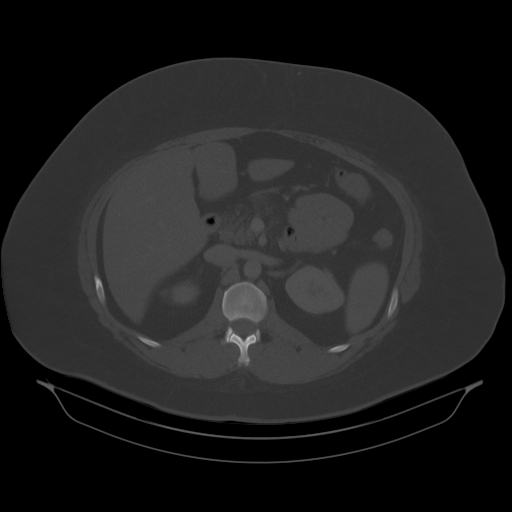
[im 76/100  soft-tissue]
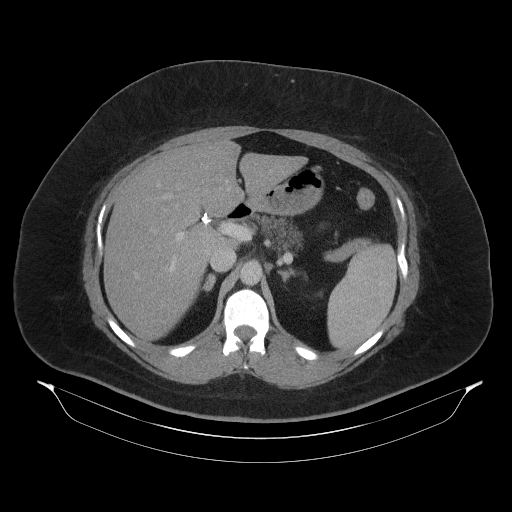
[im 81/100  lung]
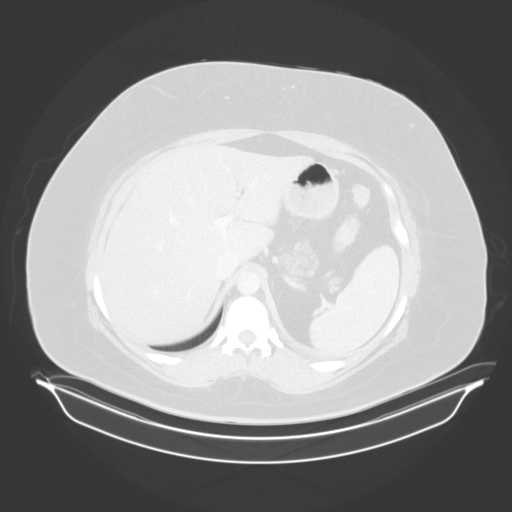
[im 85/100  soft-tissue]
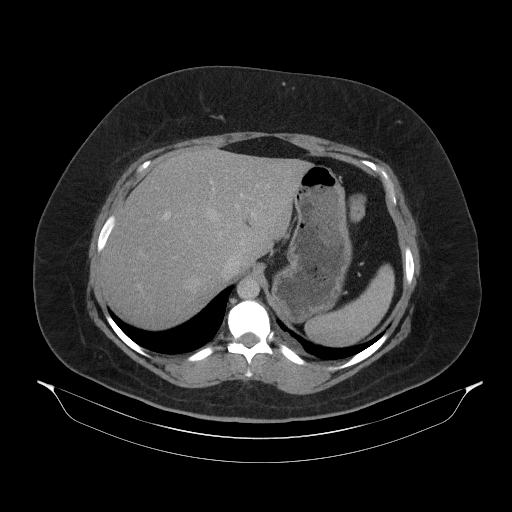
[im 85/100  lung]
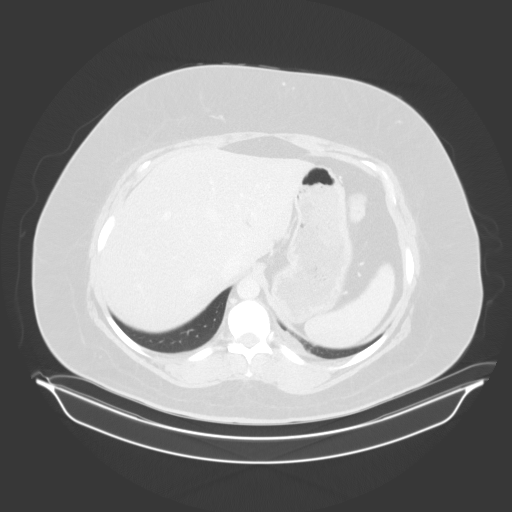
[im 90/100  lung]
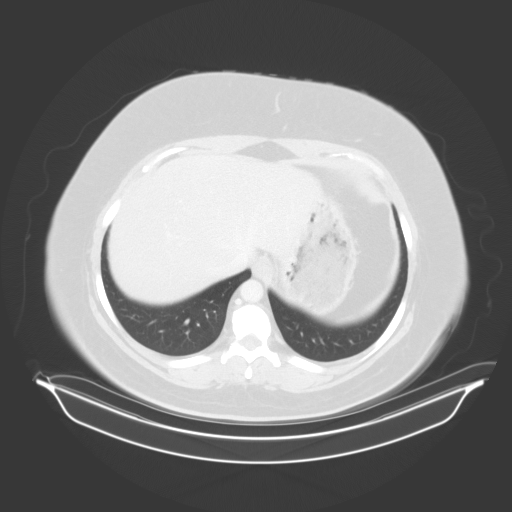
[im 95/100  soft-tissue]
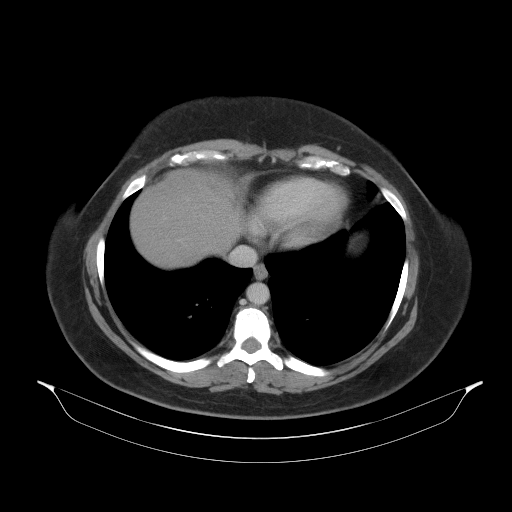
[im 95/100  lung]
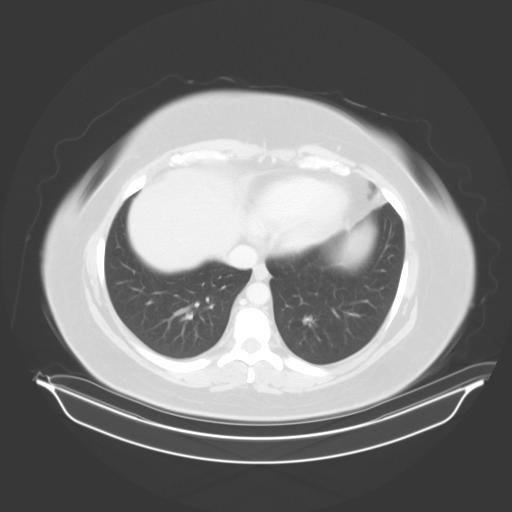

[14 of 32 positions shown; findings below may reference images not displayed]

FINDINGS: Lower chest: There is no evident lung base edema or consolidation.

Hepatobiliary: There is a degree of hepatic steatosis. No focal
liver lesions are demonstrable. The gallbladder is absent. There is
no appreciable biliary duct dilatation.

Pancreas: There is no demonstrable pancreatic mass or inflammatory
focus. There is a degree of fatty infiltration within the pancreas.

Spleen: No splenic lesions are evident.

Adrenals/Urinary Tract: Left adrenal appears normal. There is a
stable nodular area in the right adrenal measuring 1.7 x 1.3 cm.
Kidneys bilaterally show no evident mass or hydronephrosis on either
side. There is no evident renal or ureteral calculus on either side.
Urinary bladder is midline with wall thickness within normal limits.

Stomach/Bowel: There is no appreciable bowel wall or mesenteric
thickening. No evident bowel obstruction. Terminal ileum appears
unremarkable. There is no free air or portal venous air.

Vascular/Lymphatic: No abdominal aortic aneurysm. No vascular
lesions are demonstrable. There is no adenopathy in the abdomen or
pelvis.

Reproductive: Uterus is absent.  There is no evident pelvic mass.

Other: Appendix appears normal. There is no abscess or ascites in
the abdomen or pelvis. Note that the areas of inflammation in the
anterior pelvic wall including the sites of air are no longer
appreciable. There is no current inflammatory change in these areas.

Musculoskeletal: No blastic or lytic bone lesions. No intramuscular
lesions evident.
IMPRESSION: 1. Appendix appears normal. No right lower quadrant inflammatory
change. No bowel obstruction. No abscess in the abdomen or pelvis.

2. No appreciable renal or ureteral calculus. No hydronephrosis.
Urinary bladder wall thickness is within normal limits.

3.  Hepatic steatosis.  Gallbladder absent.

4. Resolution of anterior pelvic wall inflammation compared to
previous study.

Comment: A cause for patient's symptoms has not been established
with this study.

## 2021-03-08 ENCOUNTER — Ambulatory Visit
Admission: EM | Admit: 2021-03-08 | Discharge: 2021-03-08 | Disposition: A | Discharge status: Home / Self Care | Payer: 59 | Attending: Emergency Medicine | Admitting: Emergency Medicine

## 2021-03-08 ENCOUNTER — Other Ambulatory Visit: Payer: Self-pay

## 2021-03-08 DIAGNOSIS — Z20822 Contact with and (suspected) exposure to covid-19: Secondary | ICD-10-CM | POA: Insufficient documentation

## 2021-03-08 DIAGNOSIS — B349 Viral infection, unspecified: Secondary | ICD-10-CM | POA: Diagnosis not present

## 2021-03-08 LAB — RESP PANEL BY RT-PCR (FLU A&B, COVID) ARPGX2
Influenza A by PCR: NEGATIVE
Influenza B by PCR: NEGATIVE
SARS Coronavirus 2 by RT PCR: NEGATIVE

## 2021-03-08 NOTE — ED Provider Notes (Signed)
[facility]    CSN: [id number] Arrival date & time: 03/08/21  0957      History   Chief Complaint Chief Complaint  Patient presents with   Generalized Body Aches   Sore Throat   Cough    HPI NETHRA MEHLBERG is a 40 y.o. female.   Patient is a 40 year old female who presents with complaint of upper respiratory symptoms.  He states she started with cough 3 days ago but this morning woke up with headache, chills, constant headache as well as bilateral ear pain.  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  She does not take anything over-the-counter at home.  Patient states she is taking 2 COVID vaccinations but has not had a booster.   Past Medical History:  Diagnosis Date   Acute asthma exacerbation 04/10/2018   Acute non-recurrent maxillary sinusitis 09/25/2018   Asthma    Auditory hallucinations    only after anesthesia   BV (bacterial vaginosis) 02/13/2018   Diabetes mellitus without complication ([place])    diet controlled   Diabetes mellitus, type II ([place])    insulin, jardiance   Dyspnea    with exertion   Essential hypertension    Essential hypertension 11/24/2017   GERD (gastroesophageal reflux disease)    occasionally-NO MEDS   History of placement of ear tubes 05/20/2018   Hyperlipidemia    MDD (major depressive disorder)    Miscarriage    Nausea & vomiting 10/05/2019   PTSD (post-traumatic stress disorder)    Right lower quadrant abdominal pain 02/13/2018   Tachycardia     Patient Active Problem List   Diagnosis Date Noted   New onset of headaches 01/26/2021   Localized skin mass, lump, or swelling 01/26/2021   Diarrhea 01/11/2021   GAD (generalized anxiety disorder) 10/24/2020   Non-intractable vomiting 10/02/2020   Asthma exacerbation 08/03/2020   Acute URI 07/26/2020   COVID-19 virus infection 08/16/2019   Kidney stone 02/11/2019   Asthma 11/03/2018   Recurrent sinusitis 05/20/2018   Recurrent fever of  unknown etiology 05/08/2018   Recurrent URI (upper respiratory infection) 05/08/2018   Tachycardia 05/04/2018   Missed period 04/29/2018   Diabetes mellitus type 2, uncontrolled ([place]) 11/24/2017   Essential hypertension 11/24/2017   Hyperlipidemia 11/24/2017   Major depressive disorder, recurrent, severe without psychotic features ([place])    PTSD (post-traumatic stress disorder) 04/11/2015    Past Surgical History:  Procedure Laterality Date   ABDOMINAL HYSTERECTOMY     ADENOIDECTOMY     CHOLECYSTECTOMY  2009   CYSTOSCOPY N/A 07/31/2018   Procedure: CYSTOSCOPY;  Surgeon: Benjaman Kindler, MD;  Location: [facility];  Service: Gynecology;  Laterality: N/A;   LAPAROSCOPIC BILATERAL SALPINGECTOMY Bilateral 07/31/2018   Procedure: LAPAROSCOPIC BILATERAL SALPINGECTOMY;  Surgeon: Benjaman Kindler, MD;  Location: [facility];  Service: Gynecology;  Laterality: Bilateral;   LAPAROSCOPIC HYSTERECTOMY N/A 07/31/2018   Procedure: HYSTERECTOMY TOTAL LAPAROSCOPIC;  Surgeon: Benjaman Kindler, MD;  Location: [facility];  Service: Gynecology;  Laterality: N/A;   LAPAROSCOPY N/A 06/07/2019   Procedure: LAPAROSCOPY OPERATIVE, WITH PERITONEAL BIOPSIES;  Surgeon: Benjaman Kindler, MD;  Location: [facility];  Service: Gynecology;  Laterality: N/A;   LYSIS OF ADHESION N/A 07/31/2018   Procedure: LYSIS OF ADHESION;  Surgeon: Benjaman Kindler, MD;  Location: [facility];  Service: Gynecology;  Laterality: N/A;   OVARY SURGERY Right    cyst removed a while ago   TONSILLECTOMY     tubes in ear  OB History   No obstetric history on file.      Home Medications    Prior to Admission medications   Medication Sig Start Date End Date Taking? Authorizing Provider  albuterol (VENTOLIN HFA) 108 (90 Base) MCG/ACT inhaler INHALE 2 PUFFS BY MOUTH EVERY 6 HOURS AS NEEDED FOR SHORTNESS OF BREATH AND WHEEZING 10/10/20  Yes Pleas Koch, NP  BD VEO INSULIN SYRINGE U/F 31G X 15/64" 0.5 ML MISC USE AS DIRECTED 2 TIMES A DAY  WITH INSULIN 08/08/20 08/08/21 Yes   blood glucose meter kit and supplies KIT Dispense based on patient and insurance preference. Use up three times daily as directed. (FOR ICD-9 250.00, 250.01). 10/03/20  Yes Lucrezia Starch, MD  butalbital-acetaminophen-caffeine [facility]) [phone] MG tablet Take 1-2 tablets by mouth every 6 (six) hours as needed for headache. 01/31/21 01/31/22 Yes Summers, Rhonda L, PA-C  Dulaglutide (TRULICITY) 3 EP/3.2RJ SOPN Inject 3 mg as directed once a week. For diabetes. 01/17/21  Yes Pleas Koch, NP  insulin aspart protamine - aspart (NOVOLOG 70/30 FLEXPEN RELION) (70-30) 100 UNIT/ML FlexPen Inject 40 units into the skin every morning and 36 units every evening for diabetes. 01/17/21  Yes Pleas Koch, NP  Insulin Pen Needle (PEN NEEDLES) 31G X 6 MM MISC Use with insulin as directed. 10/03/20  Yes Lucrezia Starch, MD  losartan (COZAAR) 25 MG tablet Take 1 tablet (25 mg total) by mouth daily. For blood pressure. 02/08/20  Yes Pleas Koch, NP  montelukast (SINGULAIR) 10 MG tablet Take 10 mg by mouth at bedtime.   Yes [provider]  rosuvastatin (CRESTOR) 10 MG tablet Take 1 tablet (10 mg total) by mouth every evening. For cholesterol. 10/24/20  Yes Pleas Koch, NP  sertraline (ZOLOFT) 50 MG tablet Take 1 tablet (50 mg total) by mouth daily. For anxiety and depression 01/11/21  Yes Pleas Koch, NP  SUMAtriptan (IMITREX) 50 MG tablet Take 1 tablet at migraine onset. May repeat in 2 hours if headache persists or recurs. Do not exceed 2 tablets in 24 hours. 01/30/21   Pleas Koch, NP    Family History Family History  Problem Relation Age of Onset   Asthma Mother    Diabetes Mother    Hyperlipidemia Mother    Hypertension Mother    Diabetes Father    Hyperlipidemia Father    Hypertension Father    Diabetes Brother    Depression Brother    Alcohol abuse Brother    Depression Maternal Grandmother    Stroke Paternal Grandmother     Breast cancer Neg Hx     Social History Social History   Tobacco Use   Smoking status: Never   Smokeless tobacco: Never  Vaping Use   Vaping Use: Never used  Substance Use Topics   Alcohol use: No   Drug use: No     Allergies   Ibuprofen, Ciprofloxacin, Metformin and related, Penicillins, Sulfa antibiotics, Other, and Shellfish allergy   Review of Systems Review of Systems as above in HPI.  Other systems reviewed and she will be negative   Physical Exam Triage Vital Signs ED Triage Vitals  Enc Vitals Group     BP 03/08/21 1009 (!) 145/90     Pulse Rate 03/08/21 1009 92     Resp 03/08/21 1009 18     Temp 03/08/21 1009 98.2 F (36.8 C)     Temp Source 03/08/21 1009 Oral     SpO2 03/08/21 1009  100 %     Weight 03/08/21 1007 275 lb (124.7 kg)     Height 03/08/21 1007 _0  (1.676 m)     Head Circumference --      Peak Flow --      Pain Score 03/08/21 1007 8     Pain Loc --      Pain Edu? --      Excl. in [place]? --    No data found.  Updated Vital Signs BP (!) 145/90 (BP Location: Right Arm)   Pulse 92   Temp 98.2 F (36.8 C) (Oral)   Resp 18   Ht _1  (1.676 m)   Wt 275 lb (124.7 kg)   LMP 07/24/2018 (Exact Date) Comment: surgery 07/31/2018  SpO2 100%   BMI 44.39 kg/m    Physical Exam Constitutional:      General: She is not in acute distress.    Appearance: She is well-developed.     Comments: Speaking with the weak voice but unknown if this is her normal voice.  HENT:     Right Ear: A middle ear effusion is present. Tympanic membrane is not erythematous.     Left Ear: A middle ear effusion is present. Tympanic membrane is not erythematous.     Mouth/Throat:     Mouth: Mucous membranes are moist.     Pharynx: Posterior oropharyngeal erythema (minimal) present.     Tonsils: No tonsillar exudate or tonsillar abscesses.  Cardiovascular:     Rate and Rhythm: Normal rate and regular rhythm.     Heart sounds: Normal heart sounds.  Pulmonary:      Effort: Pulmonary effort is normal. No respiratory distress.     Breath sounds: Normal breath sounds. No wheezing.  Musculoskeletal:     Cervical back: Normal range of motion and neck supple.  Lymphadenopathy:     Cervical: No cervical adenopathy.  Skin:    General: Skin is warm and dry.     Capillary Refill: Capillary refill takes less than 2 seconds.  Neurological:     General: No focal deficit present.     Mental Status: She is alert and oriented to person, place, and time.     UC Treatments / Results  Labs (all labs ordered are listed, but only abnormal results are displayed) Labs Reviewed  RESP PANEL BY RT-PCR (FLU A&B, COVID) [id number]    EKG   Radiology No results found.  Procedures Procedures (including critical care time)  Medications Ordered in UC Medications - No data to display  Initial Impression / Assessment and Plan / UC Course  I have reviewed the triage vital signs and the nursing notes.  Pertinent labs & imaging results that were available during my care of the patient were reviewed by me and considered in my medical decision making (see chart for details).    Patient presented with symptoms including headache, chills, ear pain, and sore throat.  COVID and flu swabs were negative.  No fever here on her vital sign check.  Recommend symptom management of a likely viral syndrome.  Have her return if no improvement in 3 to 5 days.  No prescriptions for now.  Final Clinical Impressions(s) / UC Diagnoses   Final diagnoses:  Viral illness     Discharge Instructions      -Flu and COVID swabs were negative -Likely a viral illness given your symptoms.  Recommend over-the-counter treatment of symptoms.  Saline nasal rinse to help with opening up the nasal  and sinuses passage to allow your ear to drain.  Can also use Flonase nasal spray -Ibuprofen Tylenol as needed for pain and fever. -Rest and push fluids -Follow-up with primary care or this clinic should  your symptoms worsen or not improve in the next 3 to 5 days.     ED Prescriptions   None    PDMP not reviewed this encounter.   Luvenia Redden, PA-C 03/08/21 1223

## 2021-03-08 NOTE — ED Triage Notes (Signed)
Pt here with C/O headache and chills since last night, coughing for 3days, sore throat and ear pain since last night.

## 2021-03-08 NOTE — Discharge Instructions (Addendum)
-  Flu and COVID swabs were negative -Likely a viral illness given your symptoms.  Recommend over-the-counter treatment of symptoms.  Saline nasal rinse to help with opening up the nasal and sinuses passage to allow your ear to drain.  Can also use Flonase nasal spray -Ibuprofen Tylenol as needed for pain and fever. -Rest and push fluids -Follow-up with primary care or this clinic should your symptoms worsen or not improve in the next 3 to 5 days.

## 2021-03-23 ENCOUNTER — Telehealth: Payer: Self-pay | Admitting: Primary Care

## 2021-03-23 NOTE — Telephone Encounter (Signed)
Pt came in stating that she would need clearance from treating clinician with current listed medical management for patients history of MDD. If on medications for this current condition, letter must state whether patient is tolerating these  medications without any side effects. Letter must state that conditions is stable and patient is cleared to work in regards to history

## 2021-03-23 NOTE — Telephone Encounter (Signed)
Yes I told her that she was going to be out of the office Monday as well so she was hoping to have it done by tuesday

## 2021-03-23 NOTE — Telephone Encounter (Signed)
Was patient informed that Jae Dire is out of office until next week?

## 2021-03-26 ENCOUNTER — Telehealth: Payer: Self-pay | Admitting: Primary Care

## 2021-03-26 NOTE — Telephone Encounter (Signed)
Ok to schedule nurse visit for PPD testing?

## 2021-03-26 NOTE — Telephone Encounter (Signed)
Pt needs to have a tb test done for work

## 2021-03-27 NOTE — Telephone Encounter (Signed)
I cannot provide the letter because I don't know how she's doing on the Zoloft. We initiated Zoloft 50 mg in June 2022 as her citalopram was no longer effective. We also referred her to psychiatry.  How is she doing on Zoloft 50 mg since we initiated this in late June 2022? Has she been contacted by psychiatry? Who is her employer? Remind her of her visit for September 2022.

## 2021-03-27 NOTE — Telephone Encounter (Signed)
Okay to schedule. Please also remind patient of upcoming follow up with me scheduled for September.

## 2021-03-27 NOTE — Telephone Encounter (Signed)
Did call and talk to patient let know that you have been out of office until today. We will give her a call once addressed.

## 2021-03-27 NOTE — Telephone Encounter (Signed)
Please advise 

## 2021-03-27 NOTE — Telephone Encounter (Signed)
Joan [name] called in wanted to know about getting the letter by today due to she is on zoloft. 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

## 2021-03-27 NOTE — Telephone Encounter (Signed)
Called patient she forgot she had had done recently did not need at this time.

## 2021-03-27 NOTE — Telephone Encounter (Signed)
Patient communicated with provider in my chart message no other action needed at this time.

## 2021-04-16 ENCOUNTER — Other Ambulatory Visit: Payer: Self-pay

## 2021-04-16 ENCOUNTER — Ambulatory Visit
Admission: EM | Admit: 2021-04-16 | Discharge: 2021-04-16 | Disposition: A | Discharge status: Home / Self Care | Payer: Self-pay | Attending: Physician Assistant | Admitting: Physician Assistant

## 2021-04-16 ENCOUNTER — Ambulatory Visit (INDEPENDENT_AMBULATORY_CARE_PROVIDER_SITE_OTHER): Payer: Self-pay

## 2021-04-16 DIAGNOSIS — U071 COVID-19: Secondary | ICD-10-CM | POA: Insufficient documentation

## 2021-04-16 DIAGNOSIS — R0602 Shortness of breath: Secondary | ICD-10-CM

## 2021-04-16 DIAGNOSIS — R059 Cough, unspecified: Secondary | ICD-10-CM

## 2021-04-16 LAB — RESP PANEL BY RT-PCR (FLU A&B, COVID) ARPGX2
Influenza A by PCR: NEGATIVE
Influenza B by PCR: NEGATIVE
SARS Coronavirus 2 by RT PCR: POSITIVE — AB

## 2021-04-16 LAB — GROUP A STREP BY PCR: Group A Strep by PCR: NOT DETECTED

## 2021-04-16 MED ORDER — BENZONATATE 200 MG PO CAPS: 200.0000 mg | ORAL_CAPSULE | Freq: Three times a day (TID) | ORAL | 0 refills | Status: DC | PRN

## 2021-04-16 MED ORDER — ACETAMINOPHEN 500 MG PO TABS
1000.0000 mg | ORAL_TABLET | Freq: Once | ORAL | Status: AC
  Administered 2021-04-16: 1000 mg via ORAL

## 2021-04-16 MED ORDER — PREDNISONE 50 MG PO TABS: ORAL_TABLET | ORAL | 0 refills | Status: DC

## 2021-04-16 MED ORDER — MOLNUPIRAVIR 200 MG PO CAPS: 800.0000 mg | ORAL_CAPSULE | Freq: Two times a day (BID) | ORAL | 0 refills | Status: DC

## 2021-04-16 NOTE — ED Provider Notes (Signed)
[facility]    CSN: [id number] Arrival date & time: 04/16/21  0805      History   Chief Complaint No chief complaint on file.   HPI Joan [name] is a 40 y.o. female who presents with body aches, HA, and non productive cough 3 days ago, and the next morning had a fever of 101. Gradually her symptoms have gotten worse. Has felt a little SOB and chest tightness. Has been using her Albuterol inhaler q 4h and this helps the SOB and chest tightness. She has also taken Benadryl thinking it was just her allergies initially, and tylenol. Denies GI symptoms. Works in a day care.  Has sinus pressure and bilateral ear pain. Has had covid before 2020. Had 2 covid shots. Today she woke up with bad L ST which is painful to cough and swallow.  She has a hx of having pneumonia with 100% pulse ox.     Past Medical History:  Diagnosis Date   Acute asthma exacerbation 04/10/2018   Acute non-recurrent maxillary sinusitis 09/25/2018   Asthma    Auditory hallucinations    only after anesthesia   BV (bacterial vaginosis) 02/13/2018   Diabetes mellitus without complication ([place])    diet controlled   Diabetes mellitus, type II ([place])    insulin, jardiance   Dyspnea    with exertion   Essential hypertension    Essential hypertension 11/24/2017   GERD (gastroesophageal reflux disease)    occasionally-NO MEDS   History of placement of ear tubes 05/20/2018   Hyperlipidemia    MDD (major depressive disorder)    Miscarriage    Nausea & vomiting 10/05/2019   PTSD (post-traumatic stress disorder)    Right lower quadrant abdominal pain 02/13/2018   Tachycardia     Patient Active Problem List   Diagnosis Date Noted   New onset of headaches 01/26/2021   Localized skin mass, lump, or swelling 01/26/2021   Diarrhea 01/11/2021   GAD (generalized anxiety disorder) 10/24/2020   Non-intractable vomiting 10/02/2020   Asthma exacerbation 08/03/2020   Acute URI 07/26/2020   COVID-19 virus infection  08/16/2019   Kidney stone 02/11/2019   Asthma 11/03/2018   Recurrent sinusitis 05/20/2018   Recurrent fever of unknown etiology 05/08/2018   Recurrent URI (upper respiratory infection) 05/08/2018   Tachycardia 05/04/2018   Missed period 04/29/2018   Diabetes mellitus type 2, uncontrolled ([place]) 11/24/2017   Essential hypertension 11/24/2017   Hyperlipidemia 11/24/2017   Major depressive disorder, recurrent, severe without psychotic features ([place])    PTSD (post-traumatic stress disorder) 04/11/2015    Past Surgical History:  Procedure Laterality Date   ABDOMINAL HYSTERECTOMY     ADENOIDECTOMY     CHOLECYSTECTOMY  2009   CYSTOSCOPY N/A 07/31/2018   Procedure: CYSTOSCOPY;  Surgeon: Benjaman Kindler, MD;  Location: [facility];  Service: Gynecology;  Laterality: N/A;   LAPAROSCOPIC BILATERAL SALPINGECTOMY Bilateral 07/31/2018   Procedure: LAPAROSCOPIC BILATERAL SALPINGECTOMY;  Surgeon: Benjaman Kindler, MD;  Location: [facility];  Service: Gynecology;  Laterality: Bilateral;   LAPAROSCOPIC HYSTERECTOMY N/A 07/31/2018   Procedure: HYSTERECTOMY TOTAL LAPAROSCOPIC;  Surgeon: Benjaman Kindler, MD;  Location: [facility];  Service: Gynecology;  Laterality: N/A;   LAPAROSCOPY N/A 06/07/2019   Procedure: LAPAROSCOPY OPERATIVE, WITH PERITONEAL BIOPSIES;  Surgeon: Benjaman Kindler, MD;  Location: [facility];  Service: Gynecology;  Laterality: N/A;   LYSIS OF ADHESION N/A 07/31/2018   Procedure: LYSIS OF ADHESION;  Surgeon: Benjaman Kindler, MD;  Location: [facility];  Service: Gynecology;  Laterality: N/A;   OVARY SURGERY Right    cyst removed a while ago   TONSILLECTOMY     tubes in ear      OB History   No obstetric history on file.      Home Medications    Prior to Admission medications   Medication Sig Start Date End Date Taking? Authorizing Provider  albuterol (VENTOLIN HFA) 108 (90 Base) MCG/ACT inhaler INHALE 2 PUFFS BY MOUTH EVERY 6 HOURS AS NEEDED FOR SHORTNESS OF BREATH AND WHEEZING  10/10/20  Yes Pleas Koch, NP  BD VEO INSULIN SYRINGE U/F 31G X 15/64" 0.5 ML MISC USE AS DIRECTED 2 TIMES A DAY WITH INSULIN 08/08/20 08/08/21 Yes   benzonatate (TESSALON) 200 MG capsule Take 1 capsule (200 mg total) by mouth 3 (three) times daily as needed for cough. 04/16/21  Yes Rodriguez-Southworth, Sunday Spillers, PA-C  blood glucose meter kit and supplies KIT Dispense based on patient and insurance preference. Use up three times daily as directed. (FOR ICD-9 250.00, 250.01). 10/03/20  Yes Lucrezia Starch, MD  Dulaglutide (TRULICITY) 3 LZ/7.6BH SOPN Inject 3 mg as directed once a week. For diabetes. 01/17/21  Yes Pleas Koch, NP  insulin aspart protamine - aspart (NOVOLOG 70/30 FLEXPEN RELION) (70-30) 100 UNIT/ML FlexPen Inject 40 units into the skin every morning and 36 units every evening for diabetes. 01/17/21  Yes Pleas Koch, NP  Insulin Pen Needle (PEN NEEDLES) 31G X 6 MM MISC Use with insulin as directed. 10/03/20  Yes Lucrezia Starch, MD  losartan (COZAAR) 25 MG tablet Take 1 tablet (25 mg total) by mouth daily. For blood pressure. 02/08/20  Yes Pleas Koch, NP  molnupiravir EUA 200 MG CAPS Take 4 capsules (800 mg total) by mouth 2 (two) times daily. 04/16/21  Yes Rodriguez-Southworth, Sunday Spillers, PA-C  montelukast (SINGULAIR) 10 MG tablet Take 10 mg by mouth at bedtime.   Yes [provider]  predniSONE (DELTASONE) 50 MG tablet One qd til gone 04/16/21  Yes Rodriguez-Southworth, Sunday Spillers, PA-C  rosuvastatin (CRESTOR) 10 MG tablet Take 1 tablet (10 mg total) by mouth every evening. For cholesterol. 10/24/20  Yes Pleas Koch, NP  sertraline (ZOLOFT) 50 MG tablet Take 1 tablet (50 mg total) by mouth daily. For anxiety and depression 01/11/21  Yes Pleas Koch, NP  SUMAtriptan (IMITREX) 50 MG tablet Take 1 tablet at migraine onset. May repeat in 2 hours if headache persists or recurs. Do not exceed 2 tablets in 24 hours. 01/30/21  Yes Pleas Koch, NP    Family  History Family History  Problem Relation Age of Onset   Asthma Mother    Diabetes Mother    Hyperlipidemia Mother    Hypertension Mother    Diabetes Father    Hyperlipidemia Father    Hypertension Father    Diabetes Brother    Depression Brother    Alcohol abuse Brother    Depression Maternal Grandmother    Stroke Paternal Grandmother    Breast cancer Neg Hx     Social History Social History   Tobacco Use   Smoking status: Never   Smokeless tobacco: Never  Vaping Use   Vaping Use: Never used  Substance Use Topics   Alcohol use: No   Drug use: No     Allergies   Ibuprofen, Ciprofloxacin, Metformin and related, Penicillins, Sulfa antibiotics, Other, and Shellfish allergy   Review of Systems Review of Systems  Constitutional:  Positive for appetite change, chills,  diaphoresis, fatigue and fever.  HENT:  Positive for congestion, ear pain, postnasal drip, rhinorrhea, sneezing and sore throat. Negative for ear discharge and trouble swallowing.   Eyes:  Negative for discharge.  Respiratory:  Positive for cough, chest tightness, shortness of breath and wheezing.   Cardiovascular:  Negative for chest pain.  Gastrointestinal:  Negative for abdominal pain, diarrhea, nausea and vomiting.  Musculoskeletal:  Positive for myalgias. Negative for gait problem.  Skin:  Negative for rash.  Allergic/Immunologic: Positive for environmental allergies.  Neurological:  Positive for headaches.  Hematological:  Negative for adenopathy.    Physical Exam Triage Vital Signs ED Triage Vitals  Enc Vitals Group     BP 04/16/21 0827 119/90     Pulse Rate 04/16/21 0827 (!) 104     Resp 04/16/21 0827 20     Temp 04/16/21 0827 98.7 F (37.1 C)     Temp Source 04/16/21 0827 Oral     SpO2 04/16/21 0827 100 %     Weight 04/16/21 0825 285 lb (129.3 kg)     Height 04/16/21 0825 '5\' 6"'  (1.676 m)     Head Circumference --      Peak Flow --      Pain Score 04/16/21 0825 8     Pain Loc --       Pain Edu? --      Excl. in [place]? --    No data found.  Updated Vital Signs BP 119/90 (BP Location: Left Arm)   Pulse (!) 104   Temp 98.7 F (37.1 C) (Oral)   Resp 20   Ht '5\' 6"'  (1.676 m)   Wt 285 lb (129.3 kg)   LMP 07/24/2018 (Exact Date) Comment: surgery 07/31/2018  SpO2 100%   BMI 46.00 kg/m   Visual Acuity Right Eye Distance:   Left Eye Distance:   Bilateral Distance:    Right Eye Near:   Left Eye Near:    Bilateral Near:     Physical Exam Vitals signs and nursing note reviewed.  Constitutional:      General: She is not in acute distress.    Appearance: Normal appearance. She is ill-appearing, sounds horsed, but is toxic-appearing or diaphoretic.  HENT:     Head: Normocephalic.     Right Ear: Tympanic membrane, ear canal and external ear normal.     Left Ear: Tympanic membrane, ear canal and external ear normal.     Nose: Nose normal.     Mouth/Throat: mild erythema    Mouth: Mucous membranes are moist.  Eyes:     General: No scleral icterus.       Right eye: No discharge.        Left eye: No discharge.     Conjunctiva/sclera: Conjunctivae normal.  Neck:     Musculoskeletal: Neck supple. No neck rigidity.  Cardiovascular:     Rate and Rhythm: Normal rate and regular rhythm.     Heart sounds: No murmur.  Pulmonary:     Effort: Pulmonary effort is normal.     Breath sounds: Normal breath sounds.   Musculoskeletal: Normal range of motion.  Lymphadenopathy:     Cervical: No cervical adenopathy.  Skin:    General: Skin is warm and dry.     Coloration: Skin is not jaundiced.     Findings: No rash.  Neurological:     Mental Status: She is alert and oriented to person, place, and time.     Gait: Gait normal.  Psychiatric:  Mood and Affect: Mood normal.        Behavior: Behavior normal.        Thought Content: Thought content normal.        Judgment: Judgment normal.    UC Treatments / Results  Labs (all labs ordered are listed, but only abnormal  results are displayed) Labs Reviewed  RESP PANEL BY RT-PCR (FLU A&B, COVID) ARPGX2 - Abnormal; Notable for the following components:      Result Value   SARS Coronavirus 2 by RT PCR POSITIVE (*)    All other components within normal limits  GROUP A STREP BY PCR  Flu and strep test are neg.   EKG   Radiology DG Chest 2 View  Result Date: 04/16/2021 CLINICAL DATA:  40 year old female with fever shortness of breath and cough. EXAM: CHEST - 2 VIEW COMPARISON:  Chest radiographs 08/03/2020 and earlier. FINDINGS: Lung volumes and mediastinal contours remain normal. Visualized tracheal air column is within normal limits. Both lungs are clear. No pneumothorax or pleural effusion. No acute osseous abnormality identified. Stable cholecystectomy clips. Negative visible bowel gas pattern. IMPRESSION: Negative.  No cardiopulmonary abnormality. Electronically Signed   By: Genevie Ann M.D.   On: 04/16/2021 09:26    Procedures Procedures (including critical care time)  Medications Ordered in UC Medications  acetaminophen (TYLENOL) tablet 1,000 mg (1,000 mg Oral Given 04/16/21 0851)    Initial Impression / Assessment and Plan / UC Course  I have reviewed the triage vital signs and the nursing notes. Pertinent labs & imaging results that were available during my care of the patient were reviewed by me and considered in my medical decision making (see chart for details). She was given Tylenol 1000 mg PO here  She has covid infection and I placed her on Molnipiravir, Tessalon Perless, and Prednisone as noted. See instructions.     Final Clinical Impressions(s) / UC Diagnoses   Final diagnoses:  [id number] virus infection     Discharge Instructions      Your strep test and chest xrays are negative Your Covid test is positive. Your Flu test is negative.  You need to stay quarantined for 5 days from Friday, therefore you may return to work on Wednesday but must wear a mask for 5 more days.      Don't lay around, keep active and walk as much as you are able to to prevent worsening of your symptoms.  Follow up with your family Dr next week.  If you get short of breath and you are able to check  your oxygen with a pulse oxygen meter, if it gets to 92% or less, you need to go to the hospital to be admitted. If you dont have one, come back here and we will assess you.       ED Prescriptions     Medication Sig Dispense Auth. Provider   molnupiravir EUA 200 MG CAPS Take 4 capsules (800 mg total) by mouth 2 (two) times daily. 40 capsule Rodriguez-Southworth, Sunday Spillers, PA-C   predniSONE (DELTASONE) 50 MG tablet One qd til gone 5 tablet Rodriguez-Southworth, Sunday Spillers, PA-C   benzonatate (TESSALON) 200 MG capsule Take 1 capsule (200 mg total) by mouth 3 (three) times daily as needed for cough. 30 capsule Rodriguez-Southworth, Sunday Spillers, PA-C      PDMP not reviewed this encounter.   Shelby Mattocks, [place] 04/16/21 [phone]

## 2021-04-16 NOTE — Discharge Instructions (Addendum)
Your strep test and chest xrays are negative Your Covid test is positive. Your Flu test is negative.  You need to stay quarantined for 5 days from Friday, therefore you may return to work on Wednesday but must wear a mask for 5 more days.     Don't lay around, keep active and walk as much as you are able to to prevent worsening of your symptoms.  Follow up with your family Dr next week.  If you get short of breath and you are able to check  your oxygen with a pulse oxygen meter, if it gets to 92% or less, you need to go to the hospital to be admitted. If you dont have one, come back here and we will assess you.

## 2021-04-16 NOTE — ED Triage Notes (Signed)
Pt here with C/O Coughing, ear pain, headache since Thursday. Friday night started with body aches. Today having a really bad sore throat and headache.

## 2021-04-18 ENCOUNTER — Other Ambulatory Visit: Payer: Self-pay

## 2021-04-18 ENCOUNTER — Ambulatory Visit
Admission: EM | Admit: 2021-04-18 | Discharge: 2021-04-18 | Disposition: A | Discharge status: Home / Self Care | Payer: Self-pay

## 2021-04-18 ENCOUNTER — Encounter: Payer: Self-pay | Admitting: Emergency Medicine

## 2021-04-18 DIAGNOSIS — R059 Cough, unspecified: Secondary | ICD-10-CM

## 2021-04-18 DIAGNOSIS — U071 COVID-19: Secondary | ICD-10-CM

## 2021-04-18 NOTE — ED Triage Notes (Signed)
Pt. Dx with Covid on 04/16/21 due to return to work today still having symptoms. Cough, chest congestion, SOB, and bodyaches.

## 2021-04-18 NOTE — ED Provider Notes (Signed)
[facility]    CSN: [id number] Arrival date & time: 04/18/21  1150      History   Chief Complaint Chief Complaint  Patient presents with   Shortness of Breath   Covid Positive    HPI Joan [name] is a 40 y.o. female.  Patient presents with 6-day history of body aches, hoarse voice, congestion, cough, shortness of breath.  She denies fever, chills, rash, or other symptoms.  Patient was seen at [facility] urgent care on 04/16/2021; diagnosed with COVID-19; chest x-ray negative; treated with mulnupiravir, prednisone, Tessalon Perles.  Symptom onset 04/13/2021.  Her medical history also includes hypertension, diabetes, asthma.  No antipyretics taken today.  She has not needed her albuterol inhaler today.  The history is provided by the patient and medical records.   Past Medical History:  Diagnosis Date   Acute asthma exacerbation 04/10/2018   Acute non-recurrent maxillary sinusitis 09/25/2018   Asthma    Auditory hallucinations    only after anesthesia   BV (bacterial vaginosis) 02/13/2018   Diabetes mellitus without complication ([place])    diet controlled   Diabetes mellitus, type II ([place])    insulin, jardiance   Dyspnea    with exertion   Essential hypertension    Essential hypertension 11/24/2017   GERD (gastroesophageal reflux disease)    occasionally-NO MEDS   History of placement of ear tubes 05/20/2018   Hyperlipidemia    MDD (major depressive disorder)    Miscarriage    Nausea & vomiting 10/05/2019   PTSD (post-traumatic stress disorder)    Right lower quadrant abdominal pain 02/13/2018   Tachycardia     Patient Active Problem List   Diagnosis Date Noted   New onset of headaches 01/26/2021   Localized skin mass, lump, or swelling 01/26/2021   Diarrhea 01/11/2021   GAD (generalized anxiety disorder) 10/24/2020   Non-intractable vomiting 10/02/2020   Asthma exacerbation 08/03/2020   Acute URI 07/26/2020   COVID-19 virus infection 08/16/2019   Kidney stone  02/11/2019   Asthma 11/03/2018   Recurrent sinusitis 05/20/2018   Recurrent fever of unknown etiology 05/08/2018   Recurrent URI (upper respiratory infection) 05/08/2018   Tachycardia 05/04/2018   Missed period 04/29/2018   Diabetes mellitus type 2, uncontrolled ([place]) 11/24/2017   Essential hypertension 11/24/2017   Hyperlipidemia 11/24/2017   Major depressive disorder, recurrent, severe without psychotic features ([place])    PTSD (post-traumatic stress disorder) 04/11/2015    Past Surgical History:  Procedure Laterality Date   ABDOMINAL HYSTERECTOMY     ADENOIDECTOMY     CHOLECYSTECTOMY  2009   CYSTOSCOPY N/A 07/31/2018   Procedure: CYSTOSCOPY;  Surgeon: Benjaman Kindler, MD;  Location: [facility];  Service: Gynecology;  Laterality: N/A;   LAPAROSCOPIC BILATERAL SALPINGECTOMY Bilateral 07/31/2018   Procedure: LAPAROSCOPIC BILATERAL SALPINGECTOMY;  Surgeon: Benjaman Kindler, MD;  Location: [facility];  Service: Gynecology;  Laterality: Bilateral;   LAPAROSCOPIC HYSTERECTOMY N/A 07/31/2018   Procedure: HYSTERECTOMY TOTAL LAPAROSCOPIC;  Surgeon: Benjaman Kindler, MD;  Location: [facility];  Service: Gynecology;  Laterality: N/A;   LAPAROSCOPY N/A 06/07/2019   Procedure: LAPAROSCOPY OPERATIVE, WITH PERITONEAL BIOPSIES;  Surgeon: Benjaman Kindler, MD;  Location: [facility];  Service: Gynecology;  Laterality: N/A;   LYSIS OF ADHESION N/A 07/31/2018   Procedure: LYSIS OF ADHESION;  Surgeon: Benjaman Kindler, MD;  Location: [facility];  Service: Gynecology;  Laterality: N/A;   OVARY SURGERY Right    cyst removed a while ago   TONSILLECTOMY     tubes in  ear      OB History   No obstetric history on file.      Home Medications    Prior to Admission medications   Medication Sig Start Date End Date Taking? Authorizing Provider  albuterol (VENTOLIN HFA) 108 (90 Base) MCG/ACT inhaler INHALE 2 PUFFS BY MOUTH EVERY 6 HOURS AS NEEDED FOR SHORTNESS OF BREATH AND WHEEZING 10/10/20  Yes Pleas Koch,  NP  benzonatate (TESSALON) 200 MG capsule Take 1 capsule (200 mg total) by mouth 3 (three) times daily as needed for cough. 04/16/21  Yes Rodriguez-Southworth, Sunday Spillers, PA-C  Dulaglutide (TRULICITY) 3 QP/6.1PJ SOPN Inject 3 mg as directed once a week. For diabetes. 01/17/21  Yes Pleas Koch, NP  insulin aspart protamine - aspart (NOVOLOG 70/30 FLEXPEN RELION) (70-30) 100 UNIT/ML FlexPen Inject 40 units into the skin every morning and 36 units every evening for diabetes. 01/17/21  Yes Pleas Koch, NP  Insulin Pen Needle (PEN NEEDLES) 31G X 6 MM MISC Use with insulin as directed. 10/03/20  Yes Lucrezia Starch, MD  losartan (COZAAR) 25 MG tablet Take 1 tablet (25 mg total) by mouth daily. For blood pressure. 02/08/20  Yes Pleas Koch, NP  molnupiravir EUA 200 MG CAPS Take 4 capsules (800 mg total) by mouth 2 (two) times daily. 04/16/21  Yes Rodriguez-Southworth, Sunday Spillers, PA-C  montelukast (SINGULAIR) 10 MG tablet Take 10 mg by mouth at bedtime.   Yes [provider]  predniSONE (DELTASONE) 50 MG tablet One qd til gone 04/16/21  Yes Rodriguez-Southworth, Sunday Spillers, PA-C  rosuvastatin (CRESTOR) 10 MG tablet Take 1 tablet (10 mg total) by mouth every evening. For cholesterol. 10/24/20  Yes Pleas Koch, NP  sertraline (ZOLOFT) 50 MG tablet Take 1 tablet (50 mg total) by mouth daily. For anxiety and depression 01/11/21  Yes Pleas Koch, NP  SUMAtriptan (IMITREX) 50 MG tablet Take 1 tablet at migraine onset. May repeat in 2 hours if headache persists or recurs. Do not exceed 2 tablets in 24 hours. 01/30/21  Yes Pleas Koch, NP  BD VEO INSULIN SYRINGE U/F 31G X 15/64" 0.5 ML MISC USE AS DIRECTED 2 TIMES A DAY WITH INSULIN 08/08/20 08/08/21    blood glucose meter kit and supplies KIT Dispense based on patient and insurance preference. Use up three times daily as directed. (FOR ICD-9 250.00, 250.01). 10/03/20   Lucrezia Starch, MD    Family History Family History  Problem Relation  Age of Onset   Asthma Mother    Diabetes Mother    Hyperlipidemia Mother    Hypertension Mother    Diabetes Father    Hyperlipidemia Father    Hypertension Father    Diabetes Brother    Depression Brother    Alcohol abuse Brother    Depression Maternal Grandmother    Stroke Paternal Grandmother    Breast cancer Neg Hx     Social History Social History   Tobacco Use   Smoking status: Never   Smokeless tobacco: Never  Vaping Use   Vaping Use: Never used  Substance Use Topics   Alcohol use: No   Drug use: No     Allergies   Ibuprofen, Ciprofloxacin, Metformin and related, Penicillins, Sulfa antibiotics, Other, and Shellfish allergy   Review of Systems Review of Systems  Constitutional:  Negative for chills and fever.  HENT:  Positive for congestion. Negative for ear pain and sore throat.   Respiratory:  Positive for shortness of breath. Negative for  cough.   Cardiovascular:  Negative for chest pain and palpitations.  Gastrointestinal:  Negative for abdominal pain and vomiting.  Skin:  Negative for color change and rash.  All other systems reviewed and are negative.   Physical Exam Triage Vital Signs ED Triage Vitals  Enc Vitals Group     BP      Pulse      Resp      Temp      Temp src      SpO2      Weight      Height      Head Circumference      Peak Flow      Pain Score      Pain Loc      Pain Edu?      Excl. in [place]?    No data found.  Updated Vital Signs BP 123/89 (BP Location: Left Arm)   Pulse (!) 125   Temp 98.2 F (36.8 C) (Oral)   Ht '5\' 6"'  (1.676 m)   Wt 285 lb (129.3 kg)   LMP 07/24/2018 (Exact Date) Comment: surgery 07/31/2018  SpO2 95%   BMI 46.00 kg/m   Visual Acuity Right Eye Distance:   Left Eye Distance:   Bilateral Distance:    Right Eye Near:   Left Eye Near:    Bilateral Near:     Physical Exam Vitals and nursing note reviewed.  Constitutional:      General: She is not in acute distress.    Appearance: She is  well-developed. She is obese.  HENT:     Head: Normocephalic and atraumatic.     Right Ear: Tympanic membrane normal.     Left Ear: Tympanic membrane normal.     Nose: Nose normal.     Mouth/Throat:     Mouth: Mucous membranes are moist.     Pharynx: Oropharynx is clear.  Eyes:     Conjunctiva/sclera: Conjunctivae normal.  Cardiovascular:     Rate and Rhythm: Normal rate and regular rhythm.     Heart sounds: Normal heart sounds.  Pulmonary:     Effort: Pulmonary effort is normal. No respiratory distress.     Breath sounds: Normal breath sounds. No wheezing.  Abdominal:     Palpations: Abdomen is soft.     Tenderness: There is no abdominal tenderness.  Musculoskeletal:     Cervical back: Neck supple.  Skin:    General: Skin is warm and dry.  Neurological:     General: No focal deficit present.     Mental Status: She is alert and oriented to person, place, and time.     Gait: Gait normal.  Psychiatric:        Mood and Affect: Mood normal.        Behavior: Behavior normal.     UC Treatments / Results  Labs (all labs ordered are listed, but only abnormal results are displayed) Labs Reviewed - No data to display  EKG   Radiology No results found.  Procedures Procedures (including critical care time)  Medications Ordered in UC Medications - No data to display  Initial Impression / Assessment and Plan / UC Course  I have reviewed the triage vital signs and the nursing notes.  Pertinent labs & imaging results that were available during my care of the patient were reviewed by me and considered in my medical decision making (see chart for details).   COVID-19, cough.  No respiratory distress, O2 sat 95%  on room air, lungs are clear.  She is already on molnupiravir, prednisone, Tessalon Perles which were prescribed at [facility] urgent care 2 days ago.  Her chest x-ray was negative at that time.  She states she is here today because she needs a note extending her absence from  work since she is not feeling better.  Work note provided per patient request.  Instructed her to schedule an appointment with her PCP.  ED precautions discussed.  Instructed her to continue her current medications.  She agrees to plan of care.   Final Clinical Impressions(s) / UC Diagnoses   Final diagnoses:  COVID-19  Cough     Discharge Instructions      Continue your current medications.  Schedule an appointment with your primary care provider.    Go to the emergency department if you have shortness of breath or other concerning symptoms.         ED Prescriptions   None    PDMP not reviewed this encounter.   Sharion Balloon, NP 04/18/21 [phone]

## 2021-04-18 NOTE — Discharge Instructions (Addendum)
Continue your current medications.  Schedule an appointment with your primary care provider.    Go to the emergency department if you have shortness of breath or other concerning symptoms.

## 2021-04-20 ENCOUNTER — Ambulatory Visit: Payer: Managed Care, Other (non HMO) | Admitting: Primary Care

## 2021-04-25 ENCOUNTER — Ambulatory Visit
Admission: EM | Admit: 2021-04-25 | Discharge: 2021-04-25 | Disposition: A | Discharge status: Home / Self Care | Payer: Self-pay

## 2021-04-25 ENCOUNTER — Other Ambulatory Visit: Payer: Self-pay

## 2021-04-25 DIAGNOSIS — R059 Cough, unspecified: Secondary | ICD-10-CM

## 2021-04-25 DIAGNOSIS — R5383 Other fatigue: Secondary | ICD-10-CM

## 2021-04-25 DIAGNOSIS — U071 COVID-19: Secondary | ICD-10-CM

## 2021-04-25 NOTE — ED Provider Notes (Signed)
[facility]    CSN: [id number] Arrival date & time: 04/25/21  1035      History   Chief Complaint No chief complaint on file.   HPI Joan [name] is a 40 y.o. female returning for reevaluation of cough, congestion and sore throat as well as chills.  Patient was diagnosed with COVID-19 last week.  States she has been ill for about 12 days.  She says symptoms were getting better until she went to work today and was sent home because she was feeling poorly.  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  She denies any fevers.  She does have history of asthma and admits to some shortness of breath but says her inhalers relieve that.  Patient has been taking NyQuil at night but nothing during the day.  Admits to continued fatigue.  No worsening of symptoms.  Patient has been seen on 04/16/2021 when she was diagnosed with COVID-19 and then again on 04/18/2021 since she was not feeling better.  Patient has already taken prednisone and molnupiravir.  Other concerns but she would like a work note.  HPI  Past Medical History:  Diagnosis Date   Acute asthma exacerbation 04/10/2018   Acute non-recurrent maxillary sinusitis 09/25/2018   Asthma    Auditory hallucinations    only after anesthesia   BV (bacterial vaginosis) 02/13/2018   Diabetes mellitus without complication ([place])    diet controlled   Diabetes mellitus, type II ([place])    insulin, jardiance   Dyspnea    with exertion   Essential hypertension    Essential hypertension 11/24/2017   GERD (gastroesophageal reflux disease)    occasionally-NO MEDS   History of placement of ear tubes 05/20/2018   Hyperlipidemia    MDD (major depressive disorder)    Miscarriage    Nausea & vomiting 10/05/2019   PTSD (post-traumatic stress disorder)    Right lower quadrant abdominal pain 02/13/2018   Tachycardia     Patient Active Problem List   Diagnosis Date Noted   New onset of  headaches 01/26/2021   Localized skin mass, lump, or swelling 01/26/2021   Diarrhea 01/11/2021   GAD (generalized anxiety disorder) 10/24/2020   Non-intractable vomiting 10/02/2020   Asthma exacerbation 08/03/2020   Acute URI 07/26/2020   COVID-19 virus infection 08/16/2019   Kidney stone 02/11/2019   Asthma 11/03/2018   Recurrent sinusitis 05/20/2018   Recurrent fever of unknown etiology 05/08/2018   Recurrent URI (upper respiratory infection) 05/08/2018   Tachycardia 05/04/2018   Missed period 04/29/2018   Diabetes mellitus type 2, uncontrolled ([place]) 11/24/2017   Essential hypertension 11/24/2017   Hyperlipidemia 11/24/2017   Major depressive disorder, recurrent, severe without psychotic features ([place])    PTSD (post-traumatic stress disorder) 04/11/2015    Past Surgical History:  Procedure Laterality Date   ABDOMINAL HYSTERECTOMY     ADENOIDECTOMY     CHOLECYSTECTOMY  2009   CYSTOSCOPY N/A 07/31/2018   Procedure: CYSTOSCOPY;  Surgeon: Benjaman Kindler, MD;  Location: [facility];  Service: Gynecology;  Laterality: N/A;   LAPAROSCOPIC BILATERAL SALPINGECTOMY Bilateral 07/31/2018   Procedure: LAPAROSCOPIC BILATERAL SALPINGECTOMY;  Surgeon: Benjaman Kindler, MD;  Location: [facility];  Service: Gynecology;  Laterality: Bilateral;   LAPAROSCOPIC HYSTERECTOMY N/A 07/31/2018   Procedure: HYSTERECTOMY TOTAL LAPAROSCOPIC;  Surgeon: Benjaman Kindler, MD;  Location: [facility];  Service: Gynecology;  Laterality: N/A;   LAPAROSCOPY N/A 06/07/2019   Procedure: LAPAROSCOPY OPERATIVE, WITH PERITONEAL  BIOPSIES;  Surgeon: Benjaman Kindler, MD;  Location: [facility];  Service: Gynecology;  Laterality: N/A;   LYSIS OF ADHESION N/A 07/31/2018   Procedure: LYSIS OF ADHESION;  Surgeon: Benjaman Kindler, MD;  Location: [facility];  Service: Gynecology;  Laterality: N/A;   OVARY SURGERY Right    cyst removed a while ago   TONSILLECTOMY     tubes in ear      OB History   No obstetric history on file.       Home Medications    Prior to Admission medications   Medication Sig Start Date End Date Taking? Authorizing Provider  albuterol (VENTOLIN HFA) 108 (90 Base) MCG/ACT inhaler INHALE 2 PUFFS BY MOUTH EVERY 6 HOURS AS NEEDED FOR SHORTNESS OF BREATH AND WHEEZING 10/10/20   Pleas Koch, NP  BD VEO INSULIN SYRINGE U/F 31G X 15/64" 0.5 ML MISC USE AS DIRECTED 2 TIMES A DAY WITH INSULIN 08/08/20 08/08/21    benzonatate (TESSALON) 200 MG capsule Take 1 capsule (200 mg total) by mouth 3 (three) times daily as needed for cough. 04/16/21   Rodriguez-Southworth, Sunday Spillers, PA-C  blood glucose meter kit and supplies KIT Dispense based on patient and insurance preference. Use up three times daily as directed. (FOR ICD-9 250.00, 250.01). 10/03/20   Lucrezia Starch, MD  Dulaglutide (TRULICITY) 3 VE/9.3YB SOPN Inject 3 mg as directed once a week. For diabetes. 01/17/21   Pleas Koch, NP  insulin aspart protamine - aspart (NOVOLOG 70/30 FLEXPEN RELION) (70-30) 100 UNIT/ML FlexPen Inject 40 units into the skin every morning and 36 units every evening for diabetes. 01/17/21   Pleas Koch, NP  Insulin Pen Needle (PEN NEEDLES) 31G X 6 MM MISC Use with insulin as directed. 10/03/20   Lucrezia Starch, MD  losartan (COZAAR) 25 MG tablet Take 1 tablet (25 mg total) by mouth daily. For blood pressure. 02/08/20   Pleas Koch, NP  molnupiravir EUA 200 MG CAPS Take 4 capsules (800 mg total) by mouth 2 (two) times daily. 04/16/21   Rodriguez-Southworth, Sunday Spillers, PA-C  montelukast (SINGULAIR) 10 MG tablet Take 10 mg by mouth at bedtime.    [provider]  predniSONE (DELTASONE) 50 MG tablet One qd til gone 04/16/21   Rodriguez-Southworth, Sunday Spillers, PA-C  rosuvastatin (CRESTOR) 10 MG tablet Take 1 tablet (10 mg total) by mouth every evening. For cholesterol. 10/24/20   Pleas Koch, NP  sertraline (ZOLOFT) 50 MG tablet Take 1 tablet (50 mg total) by mouth daily. For anxiety and depression 01/11/21   Pleas Koch, NP  SUMAtriptan (IMITREX) 50 MG tablet Take 1 tablet at migraine onset. May repeat in 2 hours if headache persists or recurs. Do not exceed 2 tablets in 24 hours. 01/30/21   Pleas Koch, NP    Family History Family History  Problem Relation Age of Onset   Asthma Mother    Diabetes Mother    Hyperlipidemia Mother    Hypertension Mother    Diabetes Father    Hyperlipidemia Father    Hypertension Father    Diabetes Brother    Depression Brother    Alcohol abuse Brother    Depression Maternal Grandmother    Stroke Paternal Grandmother    Breast cancer Neg Hx     Social History Social History   Tobacco Use   Smoking status: Never   Smokeless tobacco: Never  Vaping Use   Vaping Use: Never used  Substance Use Topics   Alcohol  use: No   Drug use: No     Allergies   Ibuprofen, Ciprofloxacin, Metformin and related, Penicillins, Sulfa antibiotics, Other, and Shellfish allergy   Review of Systems Review of Systems  Constitutional:  Positive for chills and fatigue. Negative for diaphoresis and fever.  HENT:  Positive for congestion and sore throat. Negative for ear pain, rhinorrhea, sinus pressure and sinus pain.   Respiratory:  Positive for cough. Negative for shortness of breath.   Gastrointestinal:  Negative for abdominal pain, nausea and vomiting.  Musculoskeletal:  Negative for arthralgias and myalgias.  Skin:  Negative for rash.  Neurological:  Negative for weakness and headaches.  Hematological:  Negative for adenopathy.    Physical Exam Triage Vital Signs ED Triage Vitals  Enc Vitals Group     BP 04/25/21 1048 116/86     Pulse Rate 04/25/21 1048 96     Resp 04/25/21 1048 18     Temp 04/25/21 1048 97.8 F (36.6 C)     Temp Source 04/25/21 1048 Oral     SpO2 04/25/21 1048 99 %     Weight 04/25/21 1046 285 lb (129.3 kg)     Height 04/25/21 1046 '5\' 6"'  (1.676 m)     Head Circumference --      Peak Flow --      Pain Score 04/25/21 1046 8      Pain Loc --      Pain Edu? --      Excl. in [place]? --    No data found.  Updated Vital Signs BP 116/86 (BP Location: Left Arm)   Pulse 96   Temp 97.8 F (36.6 C) (Oral)   Resp 18   Ht '5\' 6"'  (1.676 m)   Wt 285 lb (129.3 kg)   LMP 07/24/2018 (Exact Date) Comment: surgery 07/31/2018  SpO2 99%   BMI 46.00 kg/m       Physical Exam Vitals and nursing note reviewed.  Constitutional:      General: She is not in acute distress.    Appearance: Normal appearance. She is not ill-appearing or toxic-appearing.  HENT:     Head: Normocephalic and atraumatic.     Right Ear: Ear canal and external ear normal. A middle ear effusion is present.     Left Ear: Ear canal and external ear normal. A middle ear effusion is present.     Nose: Congestion present.     Mouth/Throat:     Mouth: Mucous membranes are moist.     Pharynx: Oropharynx is clear.  Eyes:     General: No scleral icterus.       Right eye: No discharge.        Left eye: No discharge.     Conjunctiva/sclera: Conjunctivae normal.  Cardiovascular:     Rate and Rhythm: Normal rate and regular rhythm.     Heart sounds: Normal heart sounds.  Pulmonary:     Effort: Pulmonary effort is normal. No respiratory distress.     Breath sounds: Normal breath sounds.  Musculoskeletal:     Cervical back: Neck supple.  Skin:    General: Skin is dry.  Neurological:     General: No focal deficit present.     Mental Status: She is alert. Mental status is at baseline.     Motor: No weakness.     Gait: Gait normal.  Psychiatric:        Mood and Affect: Mood normal.        Behavior: Behavior  normal.        Thought Content: Thought content normal.     UC Treatments / Results  Labs (all labs ordered are listed, but only abnormal results are displayed) Labs Reviewed - No data to display  EKG   Radiology No results found.  Procedures Procedures (including critical care time)  Medications Ordered in UC Medications - No data to  display  Initial Impression / Assessment and Plan / UC Course  I have reviewed the triage vital signs and the nursing notes.  Pertinent labs & imaging results that were available during my care of the patient were reviewed by me and considered in my medical decision making (see chart for details).  40 year old female with recent history of COVID-19 presenting for continued symptoms.  Has already had molnupiravir and prednisone and is vaccinated. Patient has been ill for 12 days.  No fevers.  Vital signs are normal and stable and she is overall well-appearing.  Clinical presentation consistent with postviral fatigue and lingering cough.  I have advised patient to take decongestants and rest increase fluids.  I have given her a work note for today and tomorrow.  Reviewed returning if she develops a fever, worsening cough or breathing issues not relieved by use of her inhaler since she has asthma.  ED precautions reviewed.    Final Clinical Impressions(s) / UC Diagnoses   Final diagnoses:  COVID-19  Fatigue, unspecified type  Cough     Discharge Instructions      -Your vitals are all normal and your exam is reassuring.  Use nasal spray during the day and decongestants  -Increase rest and fluids -You should return if you have fever, worsening cough, chest pain, increased shortness of breath not relieved by your inhaler. Go to ED for severe acute worsening of symptoms.      ED Prescriptions   None    PDMP not reviewed this encounter.   Danton Clap, PA-C 04/25/21 1128

## 2021-04-25 NOTE — ED Triage Notes (Signed)
Pt went to work today, they sent her home.

## 2021-04-25 NOTE — ED Triage Notes (Signed)
Pt here with C/O cough, sore throat, chills post Covid.

## 2021-04-25 NOTE — Discharge Instructions (Signed)
-  Your vitals are all normal and your exam is reassuring.  Use nasal spray during the day and decongestants  -Increase rest and fluids -You should return if you have fever, worsening cough, chest pain, increased shortness of breath not relieved by your inhaler. Go to ED for severe acute worsening of symptoms.

## 2021-07-02 ENCOUNTER — Emergency Department (HOSPITAL_BASED_OUTPATIENT_CLINIC_OR_DEPARTMENT_OTHER): Payer: Self-pay

## 2021-07-02 ENCOUNTER — Other Ambulatory Visit: Payer: Self-pay

## 2021-07-02 ENCOUNTER — Emergency Department (HOSPITAL_BASED_OUTPATIENT_CLINIC_OR_DEPARTMENT_OTHER)
Admission: EM | Admit: 2021-07-02 | Discharge: 2021-07-02 | Disposition: A | Discharge status: Home / Self Care | Payer: Self-pay | Attending: Emergency Medicine | Admitting: Emergency Medicine

## 2021-07-02 ENCOUNTER — Encounter (HOSPITAL_BASED_OUTPATIENT_CLINIC_OR_DEPARTMENT_OTHER): Payer: Self-pay

## 2021-07-02 DIAGNOSIS — E119 Type 2 diabetes mellitus without complications: Secondary | ICD-10-CM | POA: Insufficient documentation

## 2021-07-02 DIAGNOSIS — R000 Tachycardia, unspecified: Secondary | ICD-10-CM | POA: Insufficient documentation

## 2021-07-02 DIAGNOSIS — J069 Acute upper respiratory infection, unspecified: Secondary | ICD-10-CM | POA: Insufficient documentation

## 2021-07-02 DIAGNOSIS — Z20822 Contact with and (suspected) exposure to covid-19: Secondary | ICD-10-CM | POA: Insufficient documentation

## 2021-07-02 DIAGNOSIS — J45901 Unspecified asthma with (acute) exacerbation: Secondary | ICD-10-CM | POA: Insufficient documentation

## 2021-07-02 DIAGNOSIS — Z8616 Personal history of COVID-19: Secondary | ICD-10-CM | POA: Insufficient documentation

## 2021-07-02 DIAGNOSIS — I10 Essential (primary) hypertension: Secondary | ICD-10-CM | POA: Insufficient documentation

## 2021-07-02 LAB — RESP PANEL BY RT-PCR (FLU A&B, COVID) ARPGX2
Influenza A by PCR: NEGATIVE
Influenza B by PCR: NEGATIVE
SARS Coronavirus 2 by RT PCR: NEGATIVE

## 2021-07-02 MED ORDER — IPRATROPIUM-ALBUTEROL 0.5-2.5 (3) MG/3ML IN SOLN
3.0000 mL | Freq: Once | RESPIRATORY_TRACT | Status: AC
  Administered 2021-07-02: 15:00:00 3 mL via RESPIRATORY_TRACT
  Filled 2021-07-02: qty 3

## 2021-07-02 MED ORDER — DEXAMETHASONE SODIUM PHOSPHATE 10 MG/ML IJ SOLN
10.0000 mg | Freq: Once | INTRAMUSCULAR | Status: AC
  Administered 2021-07-02: 15:00:00 10 mg via INTRAMUSCULAR
  Filled 2021-07-02: qty 1

## 2021-07-02 NOTE — ED Provider Notes (Signed)
Emergency Department Provider Note   I have reviewed the triage vital signs and the nursing notes.   HISTORY  Chief Complaint Cough and Nasal Congestion   HPI LEZLEE [name] is a 40 y.o. female with PMH reviewed below presents to the ED with cough, congestion, and wheezing for the last 3-4 days.  Patient states that she has previously been tested for COVID and flu which came back negative but she continues to have symptoms.  She denies any chest pain, abdominal pain, vomiting, diarrhea.  She does have body aches along with headache and mild sore throat.  No sick contacts.No radiation of symptoms or modifying factors.    Past Medical History:  Diagnosis Date   Acute asthma exacerbation 04/10/2018   Acute non-recurrent maxillary sinusitis 09/25/2018   Asthma    Auditory hallucinations    only after anesthesia   BV (bacterial vaginosis) 02/13/2018   Diabetes mellitus without complication ([facility])    diet controlled   Diabetes mellitus, type II ([facility])    insulin, jardiance   Dyspnea    with exertion   Essential hypertension    Essential hypertension 11/24/2017   GERD (gastroesophageal reflux disease)    occasionally-NO MEDS   History of placement of ear tubes 05/20/2018   Hyperlipidemia    MDD (major depressive disorder)    Miscarriage    Nausea & vomiting 10/05/2019   PTSD (post-traumatic stress disorder)    Right lower quadrant abdominal pain 02/13/2018   Tachycardia     Patient Active Problem List   Diagnosis Date Noted   New onset of headaches 01/26/2021   Localized skin mass, lump, or swelling 01/26/2021   Diarrhea 01/11/2021   GAD (generalized anxiety disorder) 10/24/2020   Non-intractable vomiting 10/02/2020   Asthma exacerbation 08/03/2020   Acute URI 07/26/2020   COVID-19 virus infection 08/16/2019   Kidney stone 02/11/2019   Asthma 11/03/2018   Recurrent sinusitis 05/20/2018   Recurrent fever of unknown etiology 05/08/2018   Recurrent URI (upper respiratory  infection) 05/08/2018   Tachycardia 05/04/2018   Missed period 04/29/2018   Diabetes mellitus type 2, uncontrolled 11/24/2017   Essential hypertension 11/24/2017   Hyperlipidemia 11/24/2017   Major depressive disorder, recurrent, severe without psychotic features ([facility])    PTSD (post-traumatic stress disorder) 04/11/2015    Past Surgical History:  Procedure Laterality Date   ABDOMINAL HYSTERECTOMY     ADENOIDECTOMY     CHOLECYSTECTOMY  2009   CYSTOSCOPY N/A 07/31/2018   Procedure: CYSTOSCOPY;  Surgeon: Christeen Douglas, MD;  Location: [facility];  Service: Gynecology;  Laterality: N/A;   LAPAROSCOPIC BILATERAL SALPINGECTOMY Bilateral 07/31/2018   Procedure: LAPAROSCOPIC BILATERAL SALPINGECTOMY;  Surgeon: Christeen Douglas, MD;  Location: [facility];  Service: Gynecology;  Laterality: Bilateral;   LAPAROSCOPIC HYSTERECTOMY N/A 07/31/2018   Procedure: HYSTERECTOMY TOTAL LAPAROSCOPIC;  Surgeon: Christeen Douglas, MD;  Location: [facility];  Service: Gynecology;  Laterality: N/A;   LAPAROSCOPY N/A 06/07/2019   Procedure: LAPAROSCOPY OPERATIVE, WITH PERITONEAL BIOPSIES;  Surgeon: Christeen Douglas, MD;  Location: [facility];  Service: Gynecology;  Laterality: N/A;   LYSIS OF ADHESION N/A 07/31/2018   Procedure: LYSIS OF ADHESION;  Surgeon: Christeen Douglas, MD;  Location: [facility];  Service: Gynecology;  Laterality: N/A;   OVARY SURGERY Right    cyst removed a while ago   TONSILLECTOMY     tubes in ear      Allergies Ibuprofen, Ciprofloxacin, Metformin and related, Penicillins, Sulfa antibiotics, Other, and Shellfish allergy  Family History  Problem  Relation Age of Onset   Asthma Mother    Diabetes Mother    Hyperlipidemia Mother    Hypertension Mother    Diabetes Father    Hyperlipidemia Father    Hypertension Father    Diabetes Brother    Depression Brother    Alcohol abuse Brother    Depression Maternal Grandmother    Stroke Paternal Grandmother    Breast cancer Neg Hx     Social  History Social History   Tobacco Use   Smoking status: Never   Smokeless tobacco: Never  Vaping Use   Vaping Use: Never used  Substance Use Topics   Alcohol use: No   Drug use: No    Review of Systems  Constitutional: Positive fever/chills Eyes: No visual changes. ENT: Positive sore throat. Cardiovascular: Denies chest pain. Respiratory: Denies shortness of breath. Positive cough.  Gastrointestinal: No abdominal pain.  No nausea, no vomiting.  No diarrhea.  No constipation. Genitourinary: Negative for dysuria. Musculoskeletal: Negative for back pain. Positive body aches.  Skin: Negative for rash. Neurological: Negative for focal weakness or numbness. Positive mild HA.   10-point ROS otherwise negative.  ____________________________________________   PHYSICAL EXAM:  VITAL SIGNS: ED Triage Vitals  Enc Vitals Group     BP 07/02/21 1149 (!) 119/97     Pulse Rate 07/02/21 1149 (!) 114     Resp 07/02/21 1149 18     Temp 07/02/21 1149 98.1 F (36.7 C)     Temp Source 07/02/21 1149 Oral     SpO2 07/02/21 1149 96 %     Weight 07/02/21 1149 285 lb (129.3 kg)     Height 07/02/21 1149 5\' 6"  (1.676 m)    Constitutional: Alert and oriented. Well appearing and in no acute distress. Eyes: Conjunctivae are normal.  Head: Atraumatic. Nose: Mild congestion/rhinnorhea. Mouth/Throat: Mucous membranes are moist.  Oropharynx non-erythematous. Neck: No stridor.   Cardiovascular: Tachycardia. Good peripheral circulation. Grossly normal heart sounds.   Respiratory: Normal respiratory effort.  No retractions. Lungs CTAB. Gastrointestinal: Soft and nontender. No distention.  Musculoskeletal: No lower extremity tenderness nor edema. No gross deformities of extremities. Neurologic:  Normal speech and language. No gross focal neurologic deficits are appreciated.  Skin:  Skin is warm, dry and intact. No rash noted.   ____________________________________________   LABS (all labs  ordered are listed, but only abnormal results are displayed)  Labs Reviewed  RESP PANEL BY RT-PCR (FLU A&B, COVID) [id number]   ____________________________________________  RADIOLOGY  DG Chest 2 View  Result Date: 07/02/2021 CLINICAL DATA:  Cough, unknown diagnosis. EXAM: CHEST - 2 VIEW COMPARISON:  04/16/2021 FINDINGS: The heart size and mediastinal contours are within normal limits. Both lungs are clear. The visualized skeletal structures are unremarkable. IMPRESSION: No active cardiopulmonary disease. Electronically Signed   By: 06/16/2021 M.D.   On: 07/02/2021 12:14    ____________________________________________   PROCEDURES  Procedure(s) performed:   Procedures  None  ____________________________________________   INITIAL IMPRESSION / ASSESSMENT AND PLAN / ED COURSE  Pertinent labs & imaging results that were available during my care of the patient were reviewed by me and considered in my medical decision making (see chart for details).   Patient presents emergency department with acute viral illness symptoms.  She arrives with mild tachycardia but no hypoxemia, increased work of breathing, hypotension.  Doubt sepsis clinically.  Chest x-ray obtained to evaluate for community-acquired pneumonia showing no acute cardiopulmonary disease.  Repeat COVID and flu testing are negative.  Plan for continued supportive care, quarantine, PCP follow-up.  Discussed ED return precautions. She does have history of asthma. Tachycardia likely 2/2 recent nebs. Patient given Decadron and nebs here and feeling better on reassessment.    ____________________________________________  FINAL CLINICAL IMPRESSION(S) / ED DIAGNOSES  Final diagnoses:  Upper respiratory tract infection, unspecified type    MEDICATIONS GIVEN DURING THIS VISIT:  Medications  dexamethasone (DECADRON) injection 10 mg (10 mg Intramuscular Given 07/02/21 1518)  ipratropium-albuterol (DUONEB) 0.5-2.5 (3) MG/3ML  nebulizer solution 3 mL (3 mLs Nebulization Given 07/02/21 1509)    Note:  This document was prepared using Dragon voice recognition software and may include unintentional dictation errors.  Alona Bene, MD, [facility]    Meagan Ancona, Arlyss Repress, MD 07/05/21 [phone]

## 2021-07-02 NOTE — Discharge Instructions (Signed)

## 2021-07-02 NOTE — ED Triage Notes (Addendum)
Patient has had cough/congestion x Thursday - was seen at urgent care on Saturday - states she did not have covid/or the flu. Patient states she feels like she has gotten worse.  Patient states she wants to be tested again for covid/flu.

## 2021-08-01 IMAGING — MG MM DIGITAL DIAGNOSTIC UNILAT*L* W/ TOMO W/ CAD
6 series · 6 of 18 positions shown · non-contrast
Comparison: Previous exam(s).

CLINICAL DATA: 38-year-old female presenting for follow-up of a
likely benign left breast asymmetry.

EXAM:
DIGITAL DIAGNOSTIC UNILATERAL LEFT MAMMOGRAM WITH CAD AND TOMO

[L MLO synth-2D (1 of 2)]
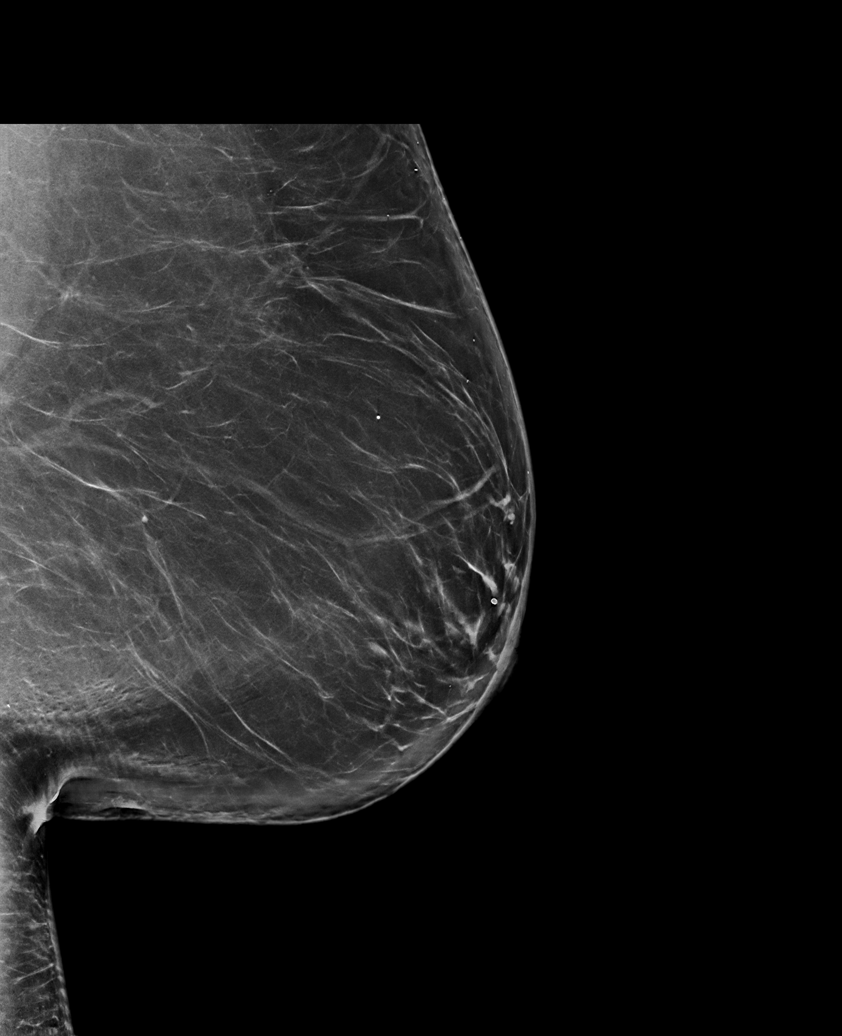

[L CC synth-2D]
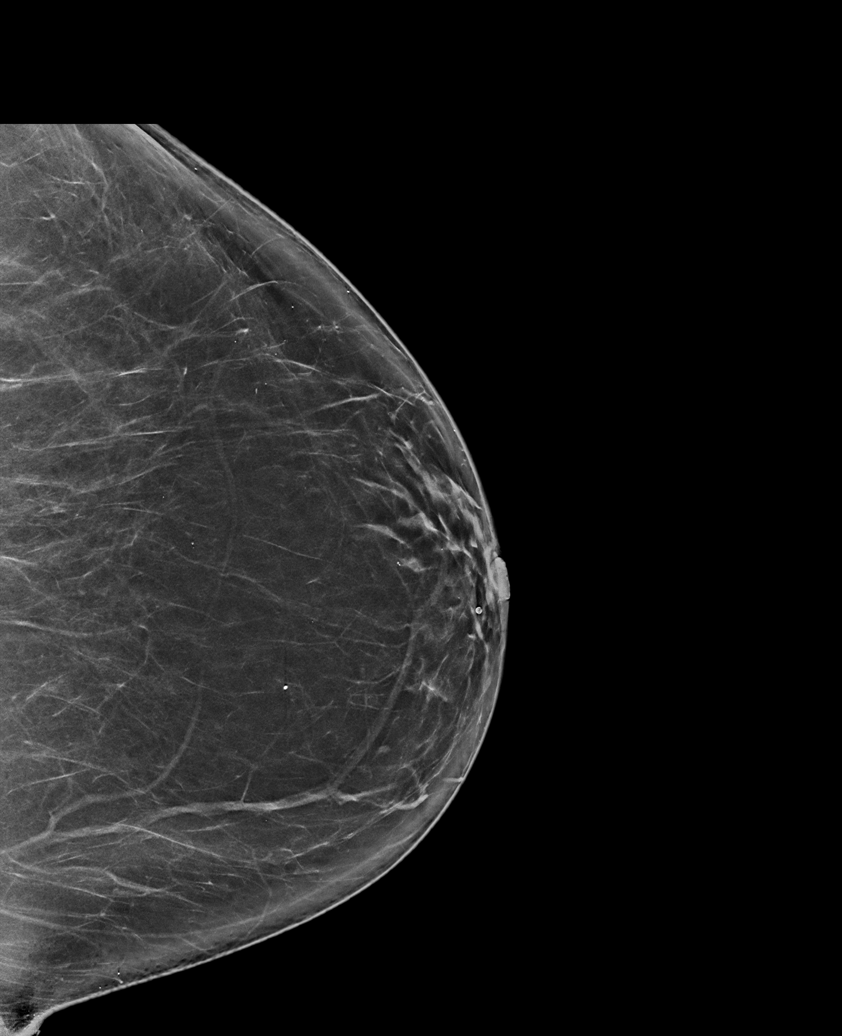

[L MLO synth-2D (2 of 2)]
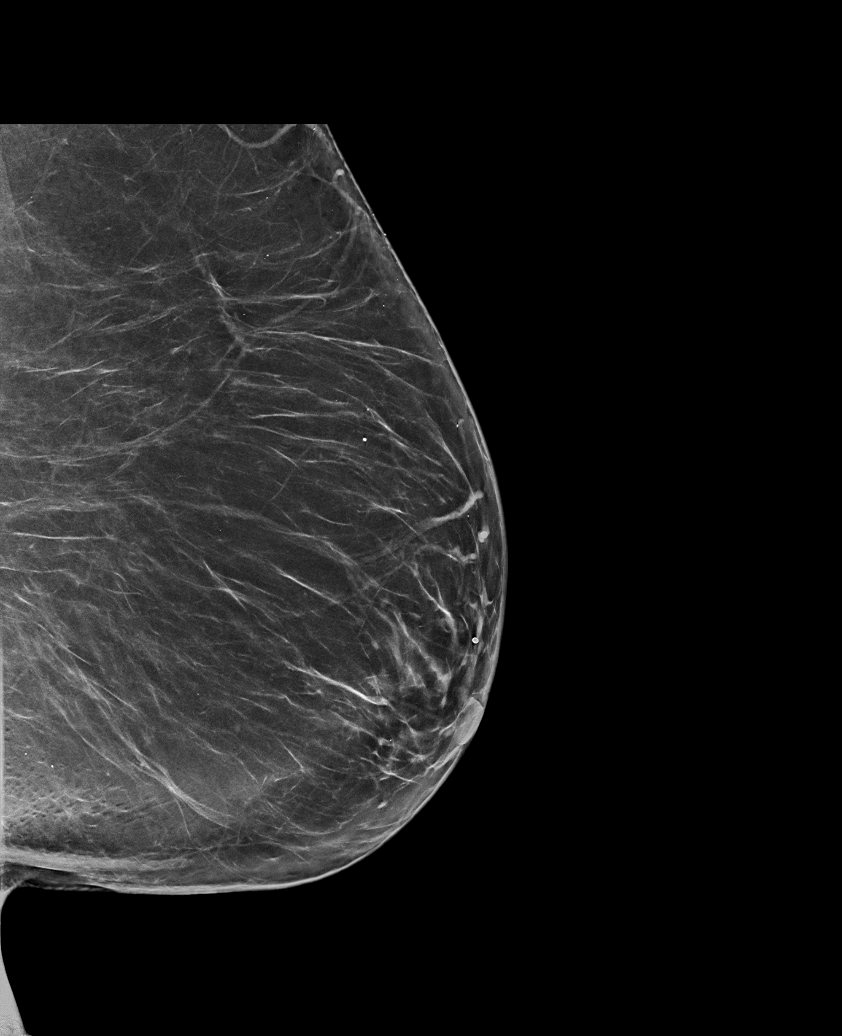

[L MLO tomo (1 of 2) · tomo slice 50/99.0]
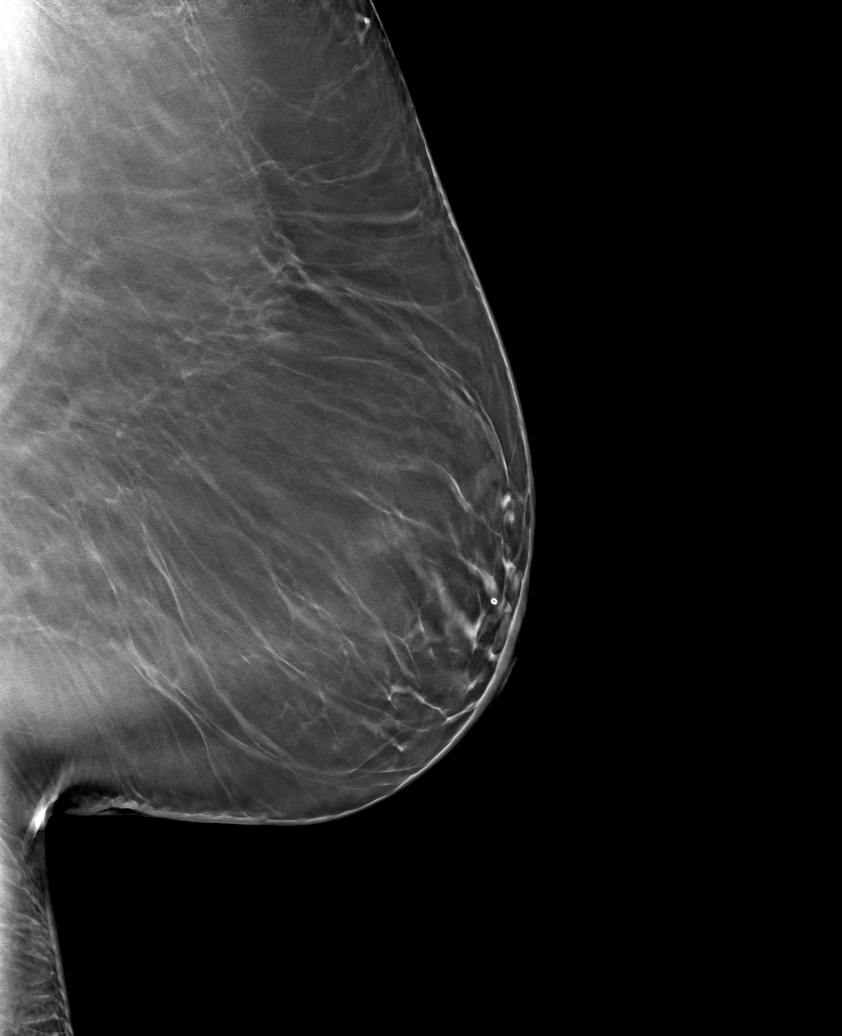

[L CC tomo · tomo slice 41/82.0]
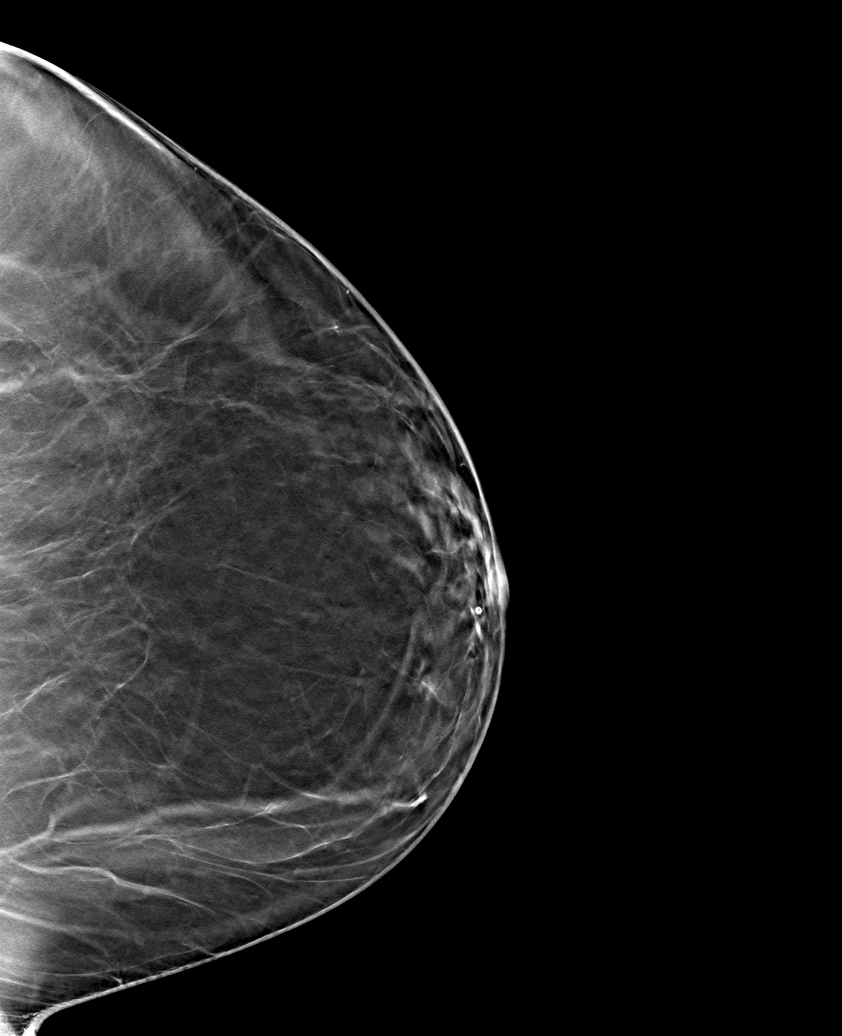

[L MLO tomo (2 of 2) · tomo slice 42/83.0]
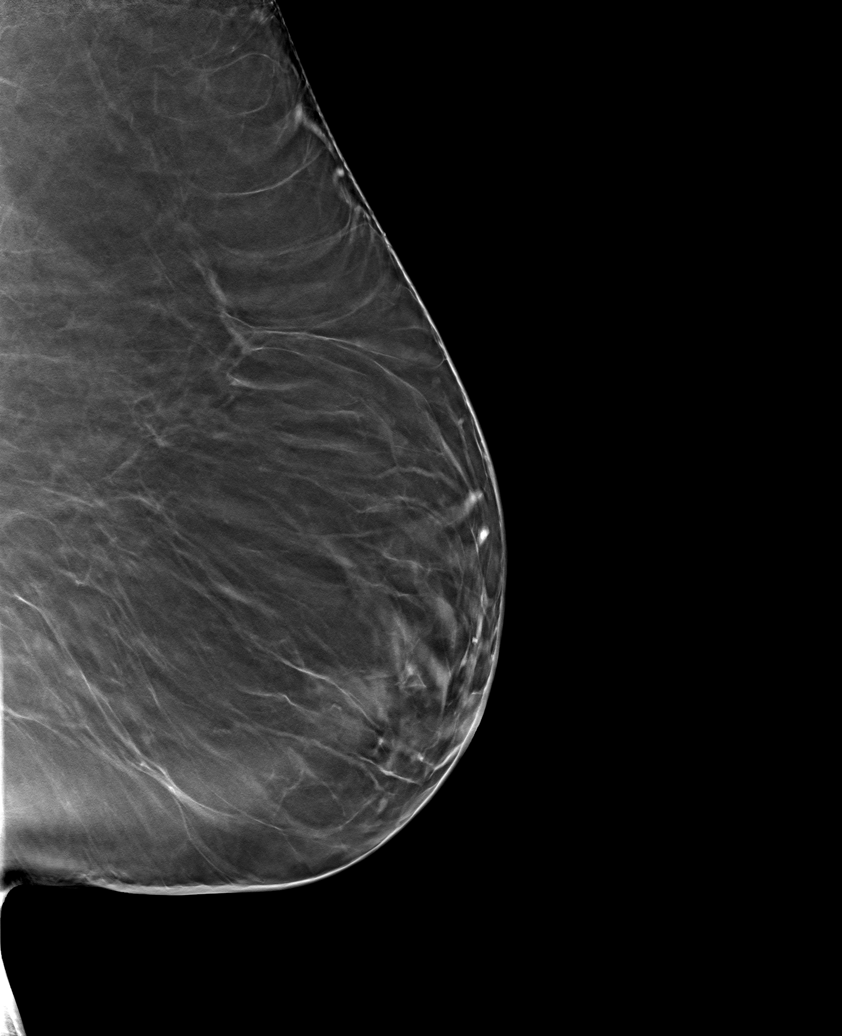

[6 of 18 positions shown; findings below may reference images not displayed]

ACR Breast Density Category b: There are scattered areas of
fibroglandular density.
FINDINGS: The small asymmetry in the medial anterior left breast is
mammographically stable. No new suspicious calcifications, masses or
areas of distortion are seen in the bilateral breasts.

Mammographic images were processed with CAD.
IMPRESSION: Stable likely benign left breast asymmetry.

RECOMMENDATION:
Bilateral diagnostic mammogram in Saturday January, 2020.

I have discussed the findings and recommendations with the patient.
If applicable, a reminder letter will be sent to the patient
regarding the next appointment.

BI-RADS CATEGORY  3: Probably benign.

## 2021-08-21 ENCOUNTER — Emergency Department (HOSPITAL_BASED_OUTPATIENT_CLINIC_OR_DEPARTMENT_OTHER): Payer: Managed Care, Other (non HMO)

## 2021-08-21 ENCOUNTER — Encounter (HOSPITAL_BASED_OUTPATIENT_CLINIC_OR_DEPARTMENT_OTHER): Payer: Self-pay

## 2021-08-21 ENCOUNTER — Emergency Department (HOSPITAL_BASED_OUTPATIENT_CLINIC_OR_DEPARTMENT_OTHER)
Admission: EM | Admit: 2021-08-21 | Discharge: 2021-08-21 | Disposition: A | Discharge status: Home / Self Care | Payer: Managed Care, Other (non HMO) | Attending: Emergency Medicine | Admitting: Emergency Medicine

## 2021-08-21 ENCOUNTER — Other Ambulatory Visit: Payer: Self-pay

## 2021-08-21 DIAGNOSIS — H9209 Otalgia, unspecified ear: Secondary | ICD-10-CM | POA: Insufficient documentation

## 2021-08-21 DIAGNOSIS — R0602 Shortness of breath: Secondary | ICD-10-CM

## 2021-08-21 DIAGNOSIS — J069 Acute upper respiratory infection, unspecified: Secondary | ICD-10-CM | POA: Insufficient documentation

## 2021-08-21 DIAGNOSIS — J45909 Unspecified asthma, uncomplicated: Secondary | ICD-10-CM | POA: Insufficient documentation

## 2021-08-21 DIAGNOSIS — Z20822 Contact with and (suspected) exposure to covid-19: Secondary | ICD-10-CM | POA: Insufficient documentation

## 2021-08-21 DIAGNOSIS — E119 Type 2 diabetes mellitus without complications: Secondary | ICD-10-CM | POA: Diagnosis not present

## 2021-08-21 DIAGNOSIS — Z794 Long term (current) use of insulin: Secondary | ICD-10-CM | POA: Diagnosis not present

## 2021-08-21 DIAGNOSIS — R059 Cough, unspecified: Secondary | ICD-10-CM | POA: Diagnosis present

## 2021-08-21 LAB — RESP PANEL BY RT-PCR (FLU A&B, COVID) ARPGX2
Influenza A by PCR: NEGATIVE
Influenza B by PCR: NEGATIVE
SARS Coronavirus 2 by RT PCR: NEGATIVE

## 2021-08-21 MED ORDER — IPRATROPIUM-ALBUTEROL 0.5-2.5 (3) MG/3ML IN SOLN
3.0000 mL | Freq: Once | RESPIRATORY_TRACT | Status: AC
  Administered 2021-08-21: 3 mL via RESPIRATORY_TRACT
  Filled 2021-08-21: qty 3

## 2021-08-21 MED ORDER — PREDNISONE 20 MG PO TABS: 20.0000 mg | ORAL_TABLET | Freq: Every day | ORAL | 0 refills | Status: AC

## 2021-08-21 NOTE — ED Triage Notes (Signed)
Pt c.o flu like sx x 6 days-seen at UC last week -neg covid/flu-dx with ear infection and bronchitis-completed zpack-feels no better-NAD-steady gait

## 2021-08-21 NOTE — Discharge Instructions (Addendum)
Please take the prednisone that I sent to your pharmacy.  If you are unable to keep control of your blood sugars, feel free to discontinue this.  It is most important that you follow-up with your primary care provider.

## 2021-08-21 NOTE — ED Provider Notes (Signed)
[facility] EMERGENCY DEPARTMENT Provider Note   CSN: [id number] Arrival date & time: 08/21/21  1105     History  Chief Complaint  Patient presents with   Cough    Joan [name] is a 41 y.o. female with a past medical history of asthma and DM presenting today with a complaint of continued cough, chest discomfort, body aches and ear pain.  Reports that she was given a Z-Pak a week ago and continues to feel poorly.  She was treated with a cough medication however she is unable to state which one.  No history of DVT/PE, leg swelling, recent travel or surgery.    Home Medications Prior to Admission medications   Medication Sig Start Date End Date Taking? Authorizing Provider  albuterol (VENTOLIN HFA) 108 (90 Base) MCG/ACT inhaler INHALE 2 PUFFS BY MOUTH EVERY 6 HOURS AS NEEDED FOR SHORTNESS OF BREATH AND WHEEZING 10/10/20   Pleas Koch, NP  benzonatate (TESSALON) 200 MG capsule Take 1 capsule (200 mg total) by mouth 3 (three) times daily as needed for cough. 04/16/21   Rodriguez-Southworth, Sunday Spillers, PA-C  blood glucose meter kit and supplies KIT Dispense based on patient and insurance preference. Use up three times daily as directed. (FOR ICD-9 250.00, 250.01). 10/03/20   Lucrezia Starch, MD  Dulaglutide (TRULICITY) 3 EN/2.7PO SOPN Inject 3 mg as directed once a week. For diabetes. 01/17/21   Pleas Koch, NP  insulin aspart protamine - aspart (NOVOLOG 70/30 FLEXPEN RELION) (70-30) 100 UNIT/ML FlexPen Inject 40 units into the skin every morning and 36 units every evening for diabetes. 01/17/21   Pleas Koch, NP  Insulin Pen Needle (PEN NEEDLES) 31G X 6 MM MISC Use with insulin as directed. 10/03/20   Lucrezia Starch, MD  losartan (COZAAR) 25 MG tablet Take 1 tablet (25 mg total) by mouth daily. For blood pressure. 02/08/20   Pleas Koch, NP  molnupiravir EUA 200 MG CAPS Take 4 capsules (800 mg total) by mouth 2 (two) times daily. 04/16/21   Rodriguez-Southworth,  Sunday Spillers, PA-C  montelukast (SINGULAIR) 10 MG tablet Take 10 mg by mouth at bedtime.    [provider]  predniSONE (DELTASONE) 50 MG tablet One qd til gone 04/16/21   Rodriguez-Southworth, Sunday Spillers, PA-C  rosuvastatin (CRESTOR) 10 MG tablet Take 1 tablet (10 mg total) by mouth every evening. For cholesterol. 10/24/20   Pleas Koch, NP  sertraline (ZOLOFT) 50 MG tablet Take 1 tablet (50 mg total) by mouth daily. For anxiety and depression 01/11/21   Pleas Koch, NP  SUMAtriptan (IMITREX) 50 MG tablet Take 1 tablet at migraine onset. May repeat in 2 hours if headache persists or recurs. Do not exceed 2 tablets in 24 hours. 01/30/21   Pleas Koch, NP      Allergies    Ibuprofen, Ciprofloxacin, Metformin and related, Penicillins, Sulfa antibiotics, Other, and Shellfish allergy    Review of Systems   Review of Systems  Constitutional:  Positive for chills.  HENT:  Positive for congestion and ear pain.   Respiratory:  Positive for cough.   Musculoskeletal:  Positive for myalgias.  Neurological:  Positive for headaches.   Physical Exam Updated Vital Signs BP (!) 149/98 (BP Location: Left Arm)    Pulse (!) 106    Temp 97.6 F (36.4 C) (Oral)    Resp 20    Ht '5\' 6"'  (1.676 m)    Wt 124.3 kg    LMP 07/24/2018 (Exact  Date) Comment: surgery 07/31/2018   SpO2 96%    BMI 44.22 kg/m  Physical Exam Vitals and nursing note reviewed.  Constitutional:      General: She is not in acute distress.    Appearance: Normal appearance. She is not ill-appearing.  HENT:     Head: Normocephalic and atraumatic.     Right Ear: Tympanic membrane and ear canal normal.     Left Ear: Tympanic membrane and ear canal normal.     Nose: Nose normal.  Eyes:     General: No scleral icterus.    Conjunctiva/sclera: Conjunctivae normal.  Cardiovascular:     Rate and Rhythm: Normal rate and regular rhythm.  Pulmonary:     Effort: Pulmonary effort is normal. No respiratory distress.     Breath sounds:  No wheezing or rales.  Skin:    Findings: No rash.  Neurological:     Mental Status: She is alert.  Psychiatric:        Mood and Affect: Mood normal.    ED Results / Procedures / Treatments   Labs (all labs ordered are listed, but only abnormal results are displayed) Labs Reviewed  RESP PANEL BY RT-PCR (FLU A&B, COVID) ARPGX2    EKG None  Radiology No results found.  Procedures Procedures    Medications Ordered in ED Medications  ipratropium-albuterol (DUONEB) 0.5-2.5 (3) MG/3ML nebulizer solution 3 mL (has no administration in time range)    ED Course/ Medical Decision Making/ A&P                           Medical Decision Making Amount and/or Complexity of Data Reviewed Radiology: ordered.  Risk Prescription drug management.   41 year old female presenting with continued URI symptoms after treatment with a Z-Pak 1 week ago.  Differential diagnosis includes pneumonia, bronchitis, COVID, flu, asthma exacerbation and PE.  Testing: COVID and flu ordered and negative.  I considered ordering a D-dimer however patient has low DVT/PE risk.  Also with symptoms more consistent with URI. Imaging: Chest x-ray ordered and individually interpreted by me.  I agree with the radiologist read of negative.  No signs of pneumonia, effusion or edema.  Treatment: Patient was given a DuoNeb treatment and reported no relief.  Disposition: I believe patient's symptoms will improve with corticosteroids.  She does have diabetes and reports that history of elevated blood sugar when taking prednisone.  I practice shared decision making and patient to be discharged on a 5-day burst of prednisone.  She will follow-up with her primary care provider this week to further assess her symptoms and potentially to increase viral testing.  Return precautions discussed at length.  She reports understanding        Final Clinical Impression(s) / ED Diagnoses Final diagnoses:  Viral URI with cough     Rx / DC Orders Results and diagnoses were explained to the patient. Return precautions discussed in full. Patient had no additional questions and expressed complete understanding.   This chart was dictated using voice recognition software.  Despite best efforts to proofread,  errors can occur which can change the documentation meaning.      Rhae Hammock, PA-C 08/21/21 [place], [place], DO 08/22/21 [id number]

## 2021-12-25 ENCOUNTER — Encounter (HOSPITAL_BASED_OUTPATIENT_CLINIC_OR_DEPARTMENT_OTHER): Payer: Self-pay | Admitting: Pediatrics

## 2021-12-25 ENCOUNTER — Other Ambulatory Visit: Payer: Self-pay

## 2021-12-25 ENCOUNTER — Emergency Department (HOSPITAL_BASED_OUTPATIENT_CLINIC_OR_DEPARTMENT_OTHER)
Admission: EM | Admit: 2021-12-25 | Discharge: 2021-12-25 | Disposition: A | Discharge status: Home / Self Care | Payer: Managed Care, Other (non HMO) | Attending: Emergency Medicine | Admitting: Emergency Medicine

## 2021-12-25 DIAGNOSIS — Z79899 Other long term (current) drug therapy: Secondary | ICD-10-CM | POA: Insufficient documentation

## 2021-12-25 DIAGNOSIS — E119 Type 2 diabetes mellitus without complications: Secondary | ICD-10-CM | POA: Insufficient documentation

## 2021-12-25 DIAGNOSIS — H60393 Other infective otitis externa, bilateral: Secondary | ICD-10-CM | POA: Insufficient documentation

## 2021-12-25 DIAGNOSIS — Z794 Long term (current) use of insulin: Secondary | ICD-10-CM | POA: Insufficient documentation

## 2021-12-25 DIAGNOSIS — R0981 Nasal congestion: Secondary | ICD-10-CM | POA: Insufficient documentation

## 2021-12-25 DIAGNOSIS — R6889 Other general symptoms and signs: Secondary | ICD-10-CM

## 2021-12-25 DIAGNOSIS — R6883 Chills (without fever): Secondary | ICD-10-CM | POA: Insufficient documentation

## 2021-12-25 DIAGNOSIS — I10 Essential (primary) hypertension: Secondary | ICD-10-CM | POA: Insufficient documentation

## 2021-12-25 DIAGNOSIS — Z20822 Contact with and (suspected) exposure to covid-19: Secondary | ICD-10-CM | POA: Insufficient documentation

## 2021-12-25 DIAGNOSIS — J45909 Unspecified asthma, uncomplicated: Secondary | ICD-10-CM | POA: Insufficient documentation

## 2021-12-25 DIAGNOSIS — M791 Myalgia, unspecified site: Secondary | ICD-10-CM | POA: Insufficient documentation

## 2021-12-25 LAB — RESP PANEL BY RT-PCR (FLU A&B, COVID) ARPGX2
Influenza A by PCR: NEGATIVE
Influenza B by PCR: NEGATIVE
SARS Coronavirus 2 by RT PCR: NEGATIVE

## 2021-12-25 MED ORDER — PREDNISONE 50 MG PO TABS
60.0000 mg | ORAL_TABLET | Freq: Once | ORAL | Status: AC
  Administered 2021-12-25: 60 mg via ORAL
  Filled 2021-12-25: qty 1

## 2021-12-25 MED ORDER — ALBUTEROL SULFATE (2.5 MG/3ML) 0.083% IN NEBU
2.5000 mg | INHALATION_SOLUTION | Freq: Once | RESPIRATORY_TRACT | Status: AC
  Administered 2021-12-25: 2.5 mg via RESPIRATORY_TRACT
  Filled 2021-12-25: qty 3

## 2021-12-25 NOTE — Discharge Instructions (Addendum)
You were seen in the emergency department today for nasal congestion and chills.  I think you likely have a viral illness. These are very common.   Make sure that you are drinking lots of fluids and getting plenty of rest. You can take decongestants (Mucinex) as long as you take them with lots of water. You can use lozenges or chloraseptic spray as needed for sore throat.   Please use Tylenol for pain or fever.  You may use 1000 mg of Tylenol every 6 hours.  Do not exceed 4000 mg of Tylenol within 24 hours.    The steroids we gave you may elevate your blood sugar for the next day or so just watch your sugar carefully.   Continue to monitor how you are doing, and return to the emergency department for new or worsening symptoms such as chest pain, difficulty breathing not related to coughing, fever despite medication, or persistent vomiting or diarrhea.

## 2021-12-25 NOTE — ED Triage Notes (Signed)
C/o generalized aches and pain, along w/ congestiona dn chills. Reported currently on ABX for bilateral ear infection.

## 2021-12-25 NOTE — ED Notes (Signed)
Reviewed discharge instructions and recommendations with patient. Pt states understanding. Pt ambulatory and discharged to home

## 2021-12-25 NOTE — ED Provider Notes (Signed)
[facility] EMERGENCY DEPARTMENT Provider Note   CSN: [id number] Arrival date & time: 12/25/21  0947     History  Chief Complaint  Patient presents with   Nasal Congestion   Chills    Joan [name] is a 41 y.o. female who presents to the emergency department for body aches, congestion and chills for 3 days. Pt reports she is currently on 10 day course of antibiotics for a bilateral ear infection, follows with ENT. She has taken some tylenol and excedrin intermittently with some relief. Highest recorded temp at home 100.1 F. Hx of asthma and reports some chest tightness, has been using albuterol inhaler at home with some relief.   HPI     Home Medications Prior to Admission medications   Medication Sig Start Date End Date Taking? Authorizing Provider  albuterol (VENTOLIN HFA) 108 (90 Base) MCG/ACT inhaler INHALE 2 PUFFS BY MOUTH EVERY 6 HOURS AS NEEDED FOR SHORTNESS OF BREATH AND WHEEZING 10/10/20   Pleas Koch, NP  benzonatate (TESSALON) 200 MG capsule Take 1 capsule (200 mg total) by mouth 3 (three) times daily as needed for cough. 04/16/21   Rodriguez-Southworth, Sunday Spillers, PA-C  blood glucose meter kit and supplies KIT Dispense based on patient and insurance preference. Use up three times daily as directed. (FOR ICD-9 250.00, 250.01). 10/03/20   Lucrezia Starch, MD  Dulaglutide (TRULICITY) 3 OI/7.1IW SOPN Inject 3 mg as directed once a week. For diabetes. 01/17/21   Pleas Koch, NP  insulin aspart protamine - aspart (NOVOLOG 70/30 FLEXPEN RELION) (70-30) 100 UNIT/ML FlexPen Inject 40 units into the skin every morning and 36 units every evening for diabetes. 01/17/21   Pleas Koch, NP  Insulin Pen Needle (PEN NEEDLES) 31G X 6 MM MISC Use with insulin as directed. 10/03/20   Lucrezia Starch, MD  losartan (COZAAR) 25 MG tablet Take 1 tablet (25 mg total) by mouth daily. For blood pressure. 02/08/20   Pleas Koch, NP  molnupiravir EUA 200 MG CAPS Take 4  capsules (800 mg total) by mouth 2 (two) times daily. 04/16/21   Rodriguez-Southworth, Sunday Spillers, PA-C  montelukast (SINGULAIR) 10 MG tablet Take 10 mg by mouth at bedtime.    [provider]  predniSONE (DELTASONE) 50 MG tablet One qd til gone 04/16/21   Rodriguez-Southworth, Sunday Spillers, PA-C  rosuvastatin (CRESTOR) 10 MG tablet Take 1 tablet (10 mg total) by mouth every evening. For cholesterol. 10/24/20   Pleas Koch, NP  sertraline (ZOLOFT) 50 MG tablet Take 1 tablet (50 mg total) by mouth daily. For anxiety and depression 01/11/21   Pleas Koch, NP  SUMAtriptan (IMITREX) 50 MG tablet Take 1 tablet at migraine onset. May repeat in 2 hours if headache persists or recurs. Do not exceed 2 tablets in 24 hours. 01/30/21   Pleas Koch, NP      Allergies    Ibuprofen, Ciprofloxacin, Metformin and related, Penicillins, Sulfa antibiotics, Other, and Shellfish allergy    Review of Systems   Review of Systems  Constitutional:  Positive for chills. Negative for fever.  HENT:  Positive for congestion, ear pain, sinus pressure and sore throat. Negative for trouble swallowing.   Respiratory:  Positive for cough and chest tightness. Negative for shortness of breath.   Cardiovascular:  Negative for chest pain.  Gastrointestinal:  Positive for nausea. Negative for abdominal pain, diarrhea and vomiting.  Genitourinary:  Negative for dysuria.  Musculoskeletal:  Positive for myalgias.  All other systems  reviewed and are negative.  Physical Exam Updated Vital Signs BP 132/87   Pulse 85   Temp 97.8 F (36.6 C)   Resp 20   Ht _0  (1.676 m)   Wt 121.6 kg   LMP 07/24/2018 (Exact Date) Comment: surgery 07/31/2018  SpO2 97%   BMI 43.26 kg/m  Physical Exam Vitals and nursing note reviewed.  Constitutional:      Appearance: Normal appearance.  HENT:     Head: Normocephalic and atraumatic.     Right Ear: Tympanic membrane is erythematous and bulging.     Left Ear: Tympanic membrane  is erythematous and bulging.     Nose: Congestion present.     Mouth/Throat:     Lips: Pink.     Mouth: Mucous membranes are moist.  Eyes:     Conjunctiva/sclera: Conjunctivae normal.  Cardiovascular:     Rate and Rhythm: Normal rate and regular rhythm.  Pulmonary:     Effort: Pulmonary effort is normal. No respiratory distress.     Breath sounds: Normal breath sounds.  Abdominal:     General: There is no distension.     Palpations: Abdomen is soft.     Tenderness: There is no abdominal tenderness.  Musculoskeletal:     Cervical back: Full passive range of motion without pain.  Skin:    General: Skin is warm and dry.  Neurological:     General: No focal deficit present.     Mental Status: She is alert.    ED Results / Procedures / Treatments   Labs (all labs ordered are listed, but only abnormal results are displayed) Labs Reviewed  RESP PANEL BY RT-PCR (FLU A&B, COVID) ARPGX2    EKG None  Radiology No results found.  Procedures Procedures    Medications Ordered in ED Medications  albuterol (PROVENTIL) (2.5 MG/3ML) 0.083% nebulizer solution 2.5 mg (2.5 mg Nebulization Given 12/25/21 1105)  predniSONE (DELTASONE) tablet 60 mg (60 mg Oral Given 12/25/21 1118)    ED Course/ Medical Decision Making/ A&P                           Medical Decision Making  This patient is a 40 y.o. female  who presents to the ED for concern of flu like symptoms.   Differential diagnoses prior to evaluation: The emergent differential diagnosis includes, but is not limited to,  Upper respiratory infection, acute sinusitis, acute otitis media, strep pharyngitis, bronchiolitis/RSV, influenza, COVID, pneumonia, appendicitis, urinary tract infection. This is not an exhaustive differential.   Past Medical History / Co-morbidities: Depression, HTN, diabetes, asthma, GERD, s/p placement of ear tubes, HLD  Physical Exam: Physical exam performed. The pertinent findings include: Patient is  afebrile, not tachycardic, not hypoxic, and in no acute distress. Lung sounds are clear, but patient is complaining of some chest tightness. Abdomen soft, non-tender. Bilateral TM's are erythematous and bulging, no perforations.   Lab Tests/Imaging studies: I Ordered, and personally interpreted labs/imaging including respiratory panel.  The pertinent results include:  negative COVID and flu.    Medications: I ordered medication including breathing treatment and steroids  for chest tightness and sinus congestion.  I have reviewed the patients home medicines and have made adjustments as needed. On reevaluation, patient states that her symptoms have somewhat improved.   Disposition: After consideration of the diagnostic results and the patients response to treatment, I feel that patient is not requiring admission or inpatient treatment for her symptoms.  Suspect her symptoms are likely related to viral infection. Will encourage symptomatic treatment with over the counter medications and continue her antibiotics for her ear infections. As she is clinically well appearing with normal vital signs and reassuring exam, I have very low suspicion for acute worsening infection at this time. Discussed reasons to return to the emergency department, and the patient is agreeable to the plan.   Final Clinical Impression(s) / ED Diagnoses Final diagnoses:  Nasal congestion  Flu-like symptoms    Rx / DC Orders ED Discharge Orders     None      Portions of this report may have been transcribed using voice recognition software. Every effort was made to ensure accuracy; however, inadvertent computerized transcription errors may be present.    Estill Cotta 12/25/21 1203    Lucrezia Starch, MD 12/25/21 1314

## 2022-01-14 ENCOUNTER — Telehealth: Payer: Self-pay

## 2022-01-14 NOTE — Telephone Encounter (Signed)
I received a prior auth email from Cover my meds for Trulicity 3MG /0.5ML pen-injectors.  Do I need to complete this one?  I do not see where patient has current insurance on file.

## 2022-01-14 NOTE — Telephone Encounter (Signed)
Not sure are you able to reach out to patient and verify insurance?

## 2022-01-15 NOTE — Telephone Encounter (Signed)
Left vmail for patient to call back to let us know if she has updated insurance coverage.

## 2022-04-01 ENCOUNTER — Ambulatory Visit (HOSPITAL_COMMUNITY)
Admission: RE | Admit: 2022-04-01 | Discharge: 2022-04-01 | Disposition: A | Discharge status: Home / Self Care | Payer: Self-pay | Attending: Psychiatry | Admitting: Psychiatry
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
## 2022-04-10 ENCOUNTER — Encounter (HOSPITAL_BASED_OUTPATIENT_CLINIC_OR_DEPARTMENT_OTHER): Payer: Self-pay

## 2022-04-10 ENCOUNTER — Emergency Department (HOSPITAL_BASED_OUTPATIENT_CLINIC_OR_DEPARTMENT_OTHER): Payer: Self-pay

## 2022-04-10 ENCOUNTER — Other Ambulatory Visit: Payer: Self-pay

## 2022-04-10 ENCOUNTER — Emergency Department (HOSPITAL_BASED_OUTPATIENT_CLINIC_OR_DEPARTMENT_OTHER)
Admission: EM | Admit: 2022-04-10 | Discharge: 2022-04-10 | Disposition: A | Discharge status: Home / Self Care | Payer: Self-pay | Attending: Emergency Medicine | Admitting: Emergency Medicine

## 2022-04-10 DIAGNOSIS — J069 Acute upper respiratory infection, unspecified: Secondary | ICD-10-CM | POA: Insufficient documentation

## 2022-04-10 DIAGNOSIS — Z20822 Contact with and (suspected) exposure to covid-19: Secondary | ICD-10-CM | POA: Insufficient documentation

## 2022-04-10 DIAGNOSIS — E119 Type 2 diabetes mellitus without complications: Secondary | ICD-10-CM | POA: Insufficient documentation

## 2022-04-10 DIAGNOSIS — Z79899 Other long term (current) drug therapy: Secondary | ICD-10-CM | POA: Insufficient documentation

## 2022-04-10 DIAGNOSIS — I10 Essential (primary) hypertension: Secondary | ICD-10-CM | POA: Insufficient documentation

## 2022-04-10 DIAGNOSIS — J45909 Unspecified asthma, uncomplicated: Secondary | ICD-10-CM | POA: Insufficient documentation

## 2022-04-10 LAB — RESPIRATORY PANEL BY PCR

## 2022-04-10 LAB — SARS CORONAVIRUS 2 BY RT PCR: SARS Coronavirus 2 by RT PCR: NEGATIVE

## 2022-04-10 MED ORDER — HYDROCODONE-ACETAMINOPHEN 5-325 MG PO TABS: 1.0000 | ORAL_TABLET | ORAL | 0 refills | Status: DC | PRN

## 2022-04-10 MED ORDER — LIDOCAINE VISCOUS HCL 2 % MT SOLN
15.0000 mL | Freq: Once | OROMUCOSAL | Status: AC
  Administered 2022-04-10: 15 mL via OROMUCOSAL
  Filled 2022-04-10: qty 15

## 2022-04-10 MED ORDER — PREDNISONE 50 MG PO TABS: 50.0000 mg | ORAL_TABLET | Freq: Every day | ORAL | 0 refills | Status: DC

## 2022-04-10 MED ORDER — DEXAMETHASONE SODIUM PHOSPHATE 10 MG/ML IJ SOLN
10.0000 mg | Freq: Once | INTRAMUSCULAR | Status: AC
  Administered 2022-04-10: 10 mg via INTRAMUSCULAR
  Filled 2022-04-10: qty 1

## 2022-04-10 MED ORDER — OXYMETAZOLINE HCL 0.05 % NA SOLN
1.0000 | Freq: Two times a day (BID) | NASAL | Status: DC
  Administered 2022-04-10: 1 via NASAL
  Filled 2022-04-10: qty 30

## 2022-04-10 NOTE — Discharge Instructions (Addendum)
The steroids will increase your sugar.  You need to eat a low carb diet and pay close attention to your sugar while you are on prednisone.  You can create a "My Chart" account to access the results of your Respiratory Virus Panel.

## 2022-04-10 NOTE — ED Provider Notes (Signed)
[facility] EMERGENCY DEPARTMENT Provider Note   CSN: [id number] Arrival date & time: 04/10/22  0827     History  Chief Complaint  Patient presents with   URI    Joan [name] is a 41 y.o. female.  Pt is a 41 yo female with a pmhx significant for mdd, ptsd, htn, dm, asthma, hld, and gerd.  Pt said she has had headache, body aches, sore throat, cough, sores on the top of her mouth, and ear pain since yesterday.  Pt has not taken any meds for her sx this am.  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  She did have covid 2 months ago.       Home Medications Prior to Admission medications   Medication Sig Start Date End Date Taking? Authorizing Provider  HYDROcodone-acetaminophen (NORCO/VICODIN) 5-325 MG tablet Take 1 tablet by mouth every 4 (four) hours as needed. 04/10/22  Yes Isla Pence, MD  predniSONE (DELTASONE) 50 MG tablet Take 1 tablet (50 mg total) by mouth daily with breakfast. 04/10/22  Yes Isla Pence, MD  albuterol (VENTOLIN HFA) 108 (90 Base) MCG/ACT inhaler INHALE 2 PUFFS BY MOUTH EVERY 6 HOURS AS NEEDED FOR SHORTNESS OF BREATH AND WHEEZING 10/10/20   Pleas Koch, NP  benzonatate (TESSALON) 200 MG capsule Take 1 capsule (200 mg total) by mouth 3 (three) times daily as needed for cough. 04/16/21   Rodriguez-Southworth, Sunday Spillers, PA-C  blood glucose meter kit and supplies KIT Dispense based on patient and insurance preference. Use up three times daily as directed. (FOR ICD-9 250.00, 250.01). 10/03/20   Lucrezia Starch, MD  Dulaglutide (TRULICITY) 3 WU/9.8JX SOPN Inject 3 mg as directed once a week. For diabetes. 01/17/21   Pleas Koch, NP  insulin aspart protamine - aspart (NOVOLOG 70/30 FLEXPEN RELION) (70-30) 100 UNIT/ML FlexPen Inject 40 units into the skin every morning and 36 units every evening for diabetes. 01/17/21   Pleas Koch, NP  Insulin Pen Needle (PEN NEEDLES) 31G X 6 MM MISC  Use with insulin as directed. 10/03/20   Lucrezia Starch, MD  losartan (COZAAR) 25 MG tablet Take 1 tablet (25 mg total) by mouth daily. For blood pressure. 02/08/20   Pleas Koch, NP  molnupiravir EUA 200 MG CAPS Take 4 capsules (800 mg total) by mouth 2 (two) times daily. 04/16/21   Rodriguez-Southworth, Sunday Spillers, PA-C  montelukast (SINGULAIR) 10 MG tablet Take 10 mg by mouth at bedtime.    [provider]  rosuvastatin (CRESTOR) 10 MG tablet Take 1 tablet (10 mg total) by mouth every evening. For cholesterol. 10/24/20   Pleas Koch, NP  sertraline (ZOLOFT) 50 MG tablet Take 1 tablet (50 mg total) by mouth daily. For anxiety and depression 01/11/21   Pleas Koch, NP  SUMAtriptan (IMITREX) 50 MG tablet Take 1 tablet at migraine onset. May repeat in 2 hours if headache persists or recurs. Do not exceed 2 tablets in 24 hours. 01/30/21   Pleas Koch, NP      Allergies    Ibuprofen, Ciprofloxacin, Metformin and related, Penicillins, Sulfa antibiotics, Other, and Shellfish allergy    Review of Systems   Review of Systems  Constitutional:  Positive for chills.  HENT:  Positive for congestion, mouth sores and sore throat.   Respiratory:  Positive for cough.   All other systems reviewed and are negative.   Physical Exam Updated Vital Signs BP Marland Kitchen)  146/91 (BP Location: Right Arm)   Pulse 90   Temp 98.1 F (36.7 C) (Oral)   Resp 18   Ht '5\' 6"'  (1.676 m)   Wt 117.9 kg   LMP 07/24/2018 (Exact Date) Comment: surgery 07/31/2018  SpO2 98%   BMI 41.97 kg/m  Physical Exam Vitals and nursing note reviewed.  Constitutional:      Appearance: Normal appearance.  HENT:     Head: Normocephalic and atraumatic.     Comments: Clear fluid behind both TMs    Right Ear: External ear normal.     Left Ear: External ear normal.     Mouth/Throat:     Mouth: Mucous membranes are dry.     Comments: Ulcers to roof of mouth Eyes:     Extraocular Movements: Extraocular movements  intact.     Conjunctiva/sclera: Conjunctivae normal.     Pupils: Pupils are equal, round, and reactive to light.  Cardiovascular:     Rate and Rhythm: Normal rate and regular rhythm.     Pulses: Normal pulses.     Heart sounds: Normal heart sounds.  Pulmonary:     Effort: Pulmonary effort is normal.     Breath sounds: Normal breath sounds.  Abdominal:     General: Abdomen is flat. Bowel sounds are normal.     Palpations: Abdomen is soft.  Musculoskeletal:        General: Normal range of motion.     Cervical back: Normal range of motion and neck supple.  Skin:    General: Skin is warm.     Capillary Refill: Capillary refill takes less than 2 seconds.  Neurological:     General: No focal deficit present.     Mental Status: She is alert and oriented to person, place, and time.  Psychiatric:        Mood and Affect: Mood normal.        Behavior: Behavior normal.     ED Results / Procedures / Treatments   Labs (all labs ordered are listed, but only abnormal results are displayed) Labs Reviewed  SARS CORONAVIRUS 2 BY RT PCR  RESPIRATORY PANEL BY PCR    EKG None  Radiology DG Chest Portable 1 View  Result Date: 04/10/2022 CLINICAL DATA:  Cough, headaches EXAM: PORTABLE CHEST 1 VIEW COMPARISON:  08/21/2021 FINDINGS: The heart size and mediastinal contours are within normal limits. Both lungs are clear. The visualized skeletal structures are unremarkable. IMPRESSION: No active disease. Electronically Signed   By: Kathreen Devoid M.D.   On: 04/10/2022 09:10    Procedures Procedures    Medications Ordered in ED Medications  oxymetazoline (AFRIN) 0.05 % nasal spray 1 spray (1 spray Each Nare Given 04/10/22 0850)  dexamethasone (DECADRON) injection 10 mg (10 mg Intramuscular Given 04/10/22 0846)  lidocaine (XYLOCAINE) 2 % viscous mouth solution 15 mL (15 mLs Mouth/Throat Given 04/10/22 0847)    ED Course/ Medical Decision Making/ A&P                           Medical Decision  Making Amount and/or Complexity of Data Reviewed Radiology: ordered.  Risk OTC drugs. Prescription drug management.   This patient presents to the ED for concern of cough, chills, this involves an extensive number of treatment options, and is a complaint that carries with it a high risk of complications and morbidity.  The differential diagnosis includes uri, pna, covid   Co morbidities that complicate the  patient evaluation  mdd, ptsd, htn, dm, asthma, hld, and gerd   Additional history obtained:  Additional history obtained from eoic chart review    Lab Tests:  I Ordered, and personally interpreted labs.  The pertinent results include:  covid neg   Imaging Studies ordered:  I ordered imaging studies including cxr  I independently visualized and interpreted imaging which showed  IMPRESSION:  No active disease.   I agree with the radiologist interpretation   Cardiac Monitoring:  The patient was maintained on a cardiac monitor.  I personally viewed and interpreted the cardiac monitored which showed an underlying rhythm of: nsr   Medicines ordered and prescription drug management:  I ordered medication including decadron, afrin, and viscous lidocaine  for sx  Reevaluation of the patient after these medicines showed that the patient improved I have reviewed the patients home medicines and have made adjustments as needed  Problem List / ED Course:  URI:  likely viral.  Pt is exposed to many sick and immunocompromised people in her hospice work.  Therefore, I will send a RVP prior to d/c.  She knows to return if worse.  F/u with pcp.   Reevaluation:  After the interventions noted above, I reevaluated the patient and found that they have :improved   Social Determinants of Health:  Lives at home   Dispostion:  After consideration of the diagnostic results and the patients response to treatment, I feel that the patent would benefit from discharge with  outpatient f/u.          Final Clinical Impression(s) / ED Diagnoses Final diagnoses:  Viral upper respiratory tract infection    Rx / DC Orders ED Discharge Orders          Ordered    predniSONE (DELTASONE) 50 MG tablet  Daily with breakfast        04/10/22 0920    HYDROcodone-acetaminophen (NORCO/VICODIN) 5-325 MG tablet  Every 4 hours PRN        04/10/22 0920              Isla Pence, MD 04/10/22 [id number]

## 2022-04-10 NOTE — ED Triage Notes (Signed)
C/o headache, bodyaches, sore throat, "sores on roof of mouth", cough, ear pain since yesterday.

## 2022-07-15 ENCOUNTER — Telehealth: Payer: Self-pay

## 2022-07-15 NOTE — Telephone Encounter (Signed)
Transition Care Management Unsuccessful Follow-up Telephone Call  Date of discharge and from where:  TCM DC [facility] ER 07-13-22 Dx: chest pain   Attempts:  1st Attempt  Reason for unsuccessful TCM follow-up call:  Left voice message   Woodfin Ganja LPN [facility] Nurse Health Advisor Direct Dial [phone]

## 2022-07-19 ENCOUNTER — Emergency Department (HOSPITAL_BASED_OUTPATIENT_CLINIC_OR_DEPARTMENT_OTHER)
Admission: EM | Admit: 2022-07-19 | Discharge: 2022-07-19 | Disposition: A | Discharge status: Home / Self Care | Payer: Self-pay | Attending: Emergency Medicine | Admitting: Emergency Medicine

## 2022-07-19 ENCOUNTER — Other Ambulatory Visit: Payer: Self-pay

## 2022-07-19 ENCOUNTER — Encounter (HOSPITAL_BASED_OUTPATIENT_CLINIC_OR_DEPARTMENT_OTHER): Payer: Self-pay

## 2022-07-19 DIAGNOSIS — Z20822 Contact with and (suspected) exposure to covid-19: Secondary | ICD-10-CM | POA: Insufficient documentation

## 2022-07-19 DIAGNOSIS — J069 Acute upper respiratory infection, unspecified: Secondary | ICD-10-CM | POA: Insufficient documentation

## 2022-07-19 DIAGNOSIS — J029 Acute pharyngitis, unspecified: Secondary | ICD-10-CM

## 2022-07-19 DIAGNOSIS — Z794 Long term (current) use of insulin: Secondary | ICD-10-CM | POA: Insufficient documentation

## 2022-07-19 LAB — RESP PANEL BY RT-PCR (RSV, FLU A&B, COVID)  RVPGX2
Influenza A by PCR: NEGATIVE
Influenza B by PCR: NEGATIVE
Resp Syncytial Virus by PCR: NEGATIVE
SARS Coronavirus 2 by RT PCR: NEGATIVE

## 2022-07-19 LAB — GROUP A STREP BY PCR: Group A Strep by PCR: NOT DETECTED

## 2022-07-19 NOTE — ED Triage Notes (Signed)
Pt reports cough, HA and nausea since yesterday. 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

## 2022-07-19 NOTE — ED Provider Notes (Signed)
[facility] EMERGENCY DEPARTMENT Provider Note   CSN: [id number] Arrival date & time: 07/19/22  1113     History  Chief Complaint  Patient presents with   Cough    Joan [name] is a 41 y.o. female.  Patient here with cough and bodyaches.  Exposure to COVID at work.  Denies any chest pain.  Little bit of sore throat at times.  No sputum production.  No pain with urination.  No nausea or vomiting.  No significant medical history.  The history is provided by the patient.       Home Medications Prior to Admission medications   Medication Sig Start Date End Date Taking? Authorizing Provider  albuterol (VENTOLIN HFA) 108 (90 Base) MCG/ACT inhaler INHALE 2 PUFFS BY MOUTH EVERY 6 HOURS AS NEEDED FOR SHORTNESS OF BREATH AND WHEEZING 10/10/20   Pleas Koch, NP  benzonatate (TESSALON) 200 MG capsule Take 1 capsule (200 mg total) by mouth 3 (three) times daily as needed for cough. 04/16/21   Rodriguez-Southworth, Sunday Spillers, PA-C  blood glucose meter kit and supplies KIT Dispense based on patient and insurance preference. Use up three times daily as directed. (FOR ICD-9 250.00, 250.01). 10/03/20   Lucrezia Starch, MD  Dulaglutide (TRULICITY) 3 CV/8.9FY SOPN Inject 3 mg as directed once a week. For diabetes. 01/17/21   Pleas Koch, NP  HYDROcodone-acetaminophen (NORCO/VICODIN) 5-325 MG tablet Take 1 tablet by mouth every 4 (four) hours as needed. 04/10/22   Isla Pence, MD  insulin aspart protamine - aspart (NOVOLOG 70/30 FLEXPEN RELION) (70-30) 100 UNIT/ML FlexPen Inject 40 units into the skin every morning and 36 units every evening for diabetes. 01/17/21   Pleas Koch, NP  Insulin Pen Needle (PEN NEEDLES) 31G X 6 MM MISC Use with insulin as directed. 10/03/20   Lucrezia Starch, MD  losartan (COZAAR) 25 MG tablet Take 1 tablet (25 mg total) by mouth daily. For blood pressure. 02/08/20   Pleas Koch, NP  molnupiravir EUA 200 MG CAPS Take 4 capsules (800 mg total)  by mouth 2 (two) times daily. 04/16/21   Rodriguez-Southworth, Sunday Spillers, PA-C  montelukast (SINGULAIR) 10 MG tablet Take 10 mg by mouth at bedtime.    [provider]  predniSONE (DELTASONE) 50 MG tablet Take 1 tablet (50 mg total) by mouth daily with breakfast. 04/10/22   Isla Pence, MD  rosuvastatin (CRESTOR) 10 MG tablet Take 1 tablet (10 mg total) by mouth every evening. For cholesterol. 10/24/20   Pleas Koch, NP  sertraline (ZOLOFT) 50 MG tablet Take 1 tablet (50 mg total) by mouth daily. For anxiety and depression 01/11/21   Pleas Koch, NP  SUMAtriptan (IMITREX) 50 MG tablet Take 1 tablet at migraine onset. May repeat in 2 hours if headache persists or recurs. Do not exceed 2 tablets in 24 hours. 01/30/21   Pleas Koch, NP      Allergies    Ibuprofen, Ciprofloxacin, Metformin and related, Penicillins, Sulfa antibiotics, Other, and Shellfish allergy    Review of Systems   Review of Systems  Physical Exam Updated Vital Signs BP 138/87 (BP Location: Left Arm)   Pulse 93   Temp 97.6 F (36.4 C) (Oral)   Resp 20   Ht _0  (1.676 m)   Wt 122.5 kg   LMP 07/24/2018 (Exact Date) Comment: surgery 07/31/2018  SpO2 96%   BMI 43.58 kg/m  Physical Exam Vitals and nursing note reviewed.  Constitutional:  General: She is not in acute distress.    Appearance: She is well-developed.  HENT:     Head: Normocephalic and atraumatic.  Eyes:     Extraocular Movements: Extraocular movements intact.     Conjunctiva/sclera: Conjunctivae normal.     Pupils: Pupils are equal, round, and reactive to light.  Cardiovascular:     Rate and Rhythm: Normal rate and regular rhythm.     Pulses: Normal pulses.     Heart sounds: Normal heart sounds. No murmur heard. Pulmonary:     Effort: Pulmonary effort is normal. No respiratory distress.     Breath sounds: Normal breath sounds.  Abdominal:     Palpations: Abdomen is soft.     Tenderness: There is no abdominal  tenderness.  Musculoskeletal:        General: No swelling.     Cervical back: Neck supple.  Skin:    General: Skin is warm and dry.     Capillary Refill: Capillary refill takes less than 2 seconds.  Neurological:     Mental Status: She is alert.  Psychiatric:        Mood and Affect: Mood normal.     ED Results / Procedures / Treatments   Labs (all labs ordered are listed, but only abnormal results are displayed) Labs Reviewed  GROUP A STREP BY PCR  RESP PANEL BY RT-PCR (RSV, FLU A&B, COVID)  [id number]    EKG None  Radiology No results found.  Procedures Procedures    Medications Ordered in ED Medications - No data to display  ED Course/ Medical Decision Making/ A&P                           Medical Decision Making  Joan EWY is here with flulike symptoms.  Differential diagnosis viral process including flu versus COVID.  Does not have any signs of pharyngitis on exam but will swab for strep.  Overall she is well-appearing.  Normal vitals.  Recommend Tylenol and ibuprofen for discomfort.  She will follow-up testing online.  Will send an antibiotic if strep test is positive.  Discharged in good condition.  Written off work.  This chart was dictated using voice recognition software.  Despite best efforts to proofread,  errors can occur which can change the documentation meaning.         Final Clinical Impression(s) / ED Diagnoses Final diagnoses:  Viral URI with cough  Sore throat    Rx / DC Orders ED Discharge Orders     None         Lennice Sites, DO 07/19/22 1217

## 2022-07-19 NOTE — Discharge Instructions (Signed)
Follow-up viral testing on your MyChart.  If your strep test is positive I will call in an antibiotic to your pharmacy.

## 2022-07-22 ENCOUNTER — Telehealth: Payer: Self-pay

## 2022-07-22 NOTE — Telephone Encounter (Signed)
Transition Care Management Follow-up Telephone Call Date of discharge and from where: [facility] 07/13/22 How have you been since you were released from the hospital? Feeling better Any questions or concerns? No  Items Reviewed: Did the pt receive and understand the discharge instructions provided? Yes  Medications obtained and verified? Yes  Other? No  Any new allergies since your discharge? No  Dietary orders reviewed? No Do you have support at home? Yes    Functional Questionnaire: (I = Independent and D = Dependent) ADLs: I  Bathing/Dressing- I  Meal Prep- I  Eating- I  Maintaining continence- I  Transferring/Ambulation- I  Managing Meds- I  Follow up appointments reviewed:  PCP Hospital f/u appt confirmed? No   Specialist Hospital f/u appt confirmed? No  . Are transportation arrangements needed? No  If their condition worsens, is the pt aware to call PCP or go to the Emergency Dept.? Yes Was the patient provided with contact information for the PCP's office or ED? Yes Was to pt encouraged to call back with questions or concerns? Yes

## 2022-08-26 ENCOUNTER — Emergency Department (HOSPITAL_BASED_OUTPATIENT_CLINIC_OR_DEPARTMENT_OTHER)
Admission: EM | Admit: 2022-08-26 | Discharge: 2022-08-26 | Disposition: A | Discharge status: Home / Self Care | Payer: Self-pay | Attending: Emergency Medicine | Admitting: Emergency Medicine

## 2022-08-26 ENCOUNTER — Encounter (HOSPITAL_BASED_OUTPATIENT_CLINIC_OR_DEPARTMENT_OTHER): Payer: Self-pay | Admitting: Emergency Medicine

## 2022-08-26 ENCOUNTER — Other Ambulatory Visit: Payer: Self-pay

## 2022-08-26 ENCOUNTER — Emergency Department (HOSPITAL_BASED_OUTPATIENT_CLINIC_OR_DEPARTMENT_OTHER): Payer: Self-pay

## 2022-08-26 DIAGNOSIS — Z794 Long term (current) use of insulin: Secondary | ICD-10-CM | POA: Insufficient documentation

## 2022-08-26 DIAGNOSIS — Z1152 Encounter for screening for COVID-19: Secondary | ICD-10-CM | POA: Insufficient documentation

## 2022-08-26 DIAGNOSIS — H9203 Otalgia, bilateral: Secondary | ICD-10-CM | POA: Insufficient documentation

## 2022-08-26 DIAGNOSIS — E119 Type 2 diabetes mellitus without complications: Secondary | ICD-10-CM | POA: Insufficient documentation

## 2022-08-26 DIAGNOSIS — Z7951 Long term (current) use of inhaled steroids: Secondary | ICD-10-CM | POA: Insufficient documentation

## 2022-08-26 DIAGNOSIS — J069 Acute upper respiratory infection, unspecified: Secondary | ICD-10-CM | POA: Insufficient documentation

## 2022-08-26 DIAGNOSIS — R110 Nausea: Secondary | ICD-10-CM | POA: Insufficient documentation

## 2022-08-26 DIAGNOSIS — R5383 Other fatigue: Secondary | ICD-10-CM | POA: Insufficient documentation

## 2022-08-26 DIAGNOSIS — J45909 Unspecified asthma, uncomplicated: Secondary | ICD-10-CM | POA: Insufficient documentation

## 2022-08-26 DIAGNOSIS — J040 Acute laryngitis: Secondary | ICD-10-CM | POA: Insufficient documentation

## 2022-08-26 LAB — RESP PANEL BY RT-PCR (RSV, FLU A&B, COVID)  RVPGX2
Influenza A by PCR: NEGATIVE
Influenza B by PCR: NEGATIVE
Resp Syncytial Virus by PCR: NEGATIVE
SARS Coronavirus 2 by RT PCR: NEGATIVE

## 2022-08-26 LAB — GROUP A STREP BY PCR: Group A Strep by PCR: NOT DETECTED

## 2022-08-26 MED ORDER — ONDANSETRON HCL 4 MG PO TABS: 4.0000 mg | ORAL_TABLET | Freq: Three times a day (TID) | ORAL | 0 refills | Status: AC | PRN

## 2022-08-26 MED ORDER — ACETAMINOPHEN 500 MG PO TABS
1000.0000 mg | ORAL_TABLET | Freq: Once | ORAL | Status: AC
Start: 2022-08-26 — End: 2022-08-26
  Administered 2022-08-26: 1000 mg via ORAL
  Filled 2022-08-26: qty 2

## 2022-08-26 NOTE — Discharge Instructions (Addendum)
Your swabs for COVID, flu, RSV, and strep were negative as well as your chest x-ray. You are likely dealing with a viral illness causing inflammation to your throat and airways. Please see instructions below for how to best manage your symptoms.   Viral Illness TREATMENT  Treatment is directed at relieving symptoms. There is no cure. Antibiotics are not effective because the infection is caused by a virus, not by bacteria. Treatment may include:  Increased fluid intake. Sports drinks offer valuable electrolytes, sugars, and fluids.  Breathing heated mist or steam (vaporizer or shower).  Eating chicken soup or other clear broths, and maintaining good nutrition.  Getting plenty of rest.  Using gargles or lozenges for comfort.  Increasing usage of your inhaler if you have asthma.  Return to work when your temperature has returned to normal.  Gargle warm salt water and spit it out for sore throat. Take benadryl to decrease sinus secretions. Continue to alternate between Tylenol and ibuprofen for pain and fever control. You may also use over the counter cough and congestion medications for symptomatic relief.   I have prescribed a short supply of nausea medication to take as needed.   Follow Up: Follow up with your primary care doctor as instructed for recheck of ongoing symptoms.  Return to emergency department for emergent changing or worsening of symptoms.

## 2022-08-26 NOTE — ED Notes (Signed)
D/c paperwork reviewed with pt, including follow up care.  No questions or concerns voiced at time of d/c. . Pt verbalized understanding, Ambulatory without assistance to ED exit, NAD.   

## 2022-08-26 NOTE — ED Provider Notes (Signed)
[place] EMERGENCY DEPARTMENT AT [facility] Provider Note   CSN: [id number] Arrival date & time: 08/26/22  6237     History  Chief Complaint  Patient presents with   Cough    Joan [name] is a 42 y.o. female with past medical history diabetes and asthma who presents to the ED complaining of 2 days of fatigue, cough, congestion, bilateral ear pressure, sore throat, fever up to 100.4 Fahrenheit, chills, body aches, and nausea.  Patient is concerned that she has influenza.  She did not receive an influenza vaccination this year.  She works with hospice care so while she has no identifiable sick contacts it is highly likely that she has been exposed to contagious illnesses.  She denies chest pain, shortness of breath, vomiting, diarrhea, abdominal pain, leg pain or swelling, trouble swallowing, or any other symptoms at this time.  She does not have a history of frequent asthma flares and has not had any audible wheezing or difficulties breathing.     Home Medications Prior to Admission medications   Medication Sig Start Date End Date Taking? Authorizing Provider  ondansetron (ZOFRAN) 4 MG tablet Take 1 tablet (4 mg total) by mouth every 8 (eight) hours as needed for up to 3 days for nausea or vomiting. 08/26/22 08/29/22 Yes Kissy Cielo L, PA-C  albuterol (VENTOLIN HFA) 108 (90 Base) MCG/ACT inhaler INHALE 2 PUFFS BY MOUTH EVERY 6 HOURS AS NEEDED FOR SHORTNESS OF BREATH AND WHEEZING 10/10/20   Doreene Nest, NP  benzonatate (TESSALON) 200 MG capsule Take 1 capsule (200 mg total) by mouth 3 (three) times daily as needed for cough. 04/16/21   Rodriguez-Southworth, Nettie Elm, PA-C  blood glucose meter kit and supplies KIT Dispense based on patient and insurance preference. Use up three times daily as directed. (FOR ICD-9 250.00, 250.01). 10/03/20   Gilles Chiquito, MD  Dulaglutide (TRULICITY) 3 MG/0.5ML SOPN Inject 3 mg as directed once a week. For diabetes. 01/17/21   Doreene Nest, NP  HYDROcodone-acetaminophen (NORCO/VICODIN) 5-325 MG tablet Take 1 tablet by mouth every 4 (four) hours as needed. 04/10/22   Jacalyn Lefevre, MD  insulin aspart protamine - aspart (NOVOLOG 70/30 FLEXPEN RELION) (70-30) 100 UNIT/ML FlexPen Inject 40 units into the skin every morning and 36 units every evening for diabetes. 01/17/21   Doreene Nest, NP  Insulin Pen Needle (PEN NEEDLES) 31G X 6 MM MISC Use with insulin as directed. 10/03/20   Gilles Chiquito, MD  losartan (COZAAR) 25 MG tablet Take 1 tablet (25 mg total) by mouth daily. For blood pressure. 02/08/20   Doreene Nest, NP  molnupiravir EUA 200 MG CAPS Take 4 capsules (800 mg total) by mouth 2 (two) times daily. 04/16/21   Rodriguez-Southworth, Nettie Elm, PA-C  montelukast (SINGULAIR) 10 MG tablet Take 10 mg by mouth at bedtime.    [provider]  predniSONE (DELTASONE) 50 MG tablet Take 1 tablet (50 mg total) by mouth daily with breakfast. 04/10/22   Jacalyn Lefevre, MD  rosuvastatin (CRESTOR) 10 MG tablet Take 1 tablet (10 mg total) by mouth every evening. For cholesterol. 10/24/20   Doreene Nest, NP  sertraline (ZOLOFT) 50 MG tablet Take 1 tablet (50 mg total) by mouth daily. For anxiety and depression 01/11/21   Doreene Nest, NP  SUMAtriptan (IMITREX) 50 MG tablet Take 1 tablet at migraine onset. May repeat in 2 hours if headache persists or recurs. Do not exceed 2 tablets in 24 hours.  01/30/21   Pleas Koch, NP      Allergies    Ibuprofen, Ciprofloxacin, Metformin and related, Penicillins, Sulfa antibiotics, Other, and Shellfish allergy    Review of Systems   Review of Systems  Constitutional:  Positive for chills, fatigue and fever. Negative for appetite change and diaphoresis.  HENT:  Positive for congestion, ear pain, rhinorrhea, sore throat and voice change. Negative for facial swelling, sinus pressure and trouble swallowing.   Eyes:  Negative for photophobia.  Respiratory:  Positive for  cough. Negative for apnea, choking, chest tightness, shortness of breath, wheezing and stridor.   Cardiovascular:  Negative for chest pain, palpitations and leg swelling.  Gastrointestinal:  Positive for nausea. Negative for abdominal pain, blood in stool, constipation, diarrhea and vomiting.  Genitourinary:  Negative for difficulty urinating, dysuria and flank pain.  Musculoskeletal:  Positive for myalgias. Negative for gait problem, neck pain and neck stiffness.  Neurological:  Negative for dizziness, speech difficulty, weakness, light-headedness, numbness and headaches.  Psychiatric/Behavioral:  Negative for confusion.     Physical Exam Updated Vital Signs BP (!) 136/93   Pulse 66   Temp 97.7 F (36.5 C)   Resp 17   Wt 124.7 kg   LMP 07/24/2018 (Exact Date) Comment: surgery 07/31/2018  SpO2 99%   BMI 44.39 kg/m  Physical Exam Vitals and nursing note reviewed.  Constitutional:      General: She is not in acute distress.    Appearance: Normal appearance. She is not toxic-appearing or diaphoretic.  HENT:     Head: Normocephalic and atraumatic.     Right Ear: Tympanic membrane, ear canal and external ear normal. There is no impacted cerumen.     Left Ear: Tympanic membrane, ear canal and external ear normal. There is no impacted cerumen.     Nose: Congestion present. Rhinorrhea: bilateral.    Mouth/Throat:     Mouth: Mucous membranes are moist.     Pharynx: Oropharynx is clear. Posterior oropharyngeal erythema (mild posterior pharyngeal) present. No pharyngeal swelling, oropharyngeal exudate or uvula swelling.     Tonsils: No tonsillar abscesses.     Comments: Voice hoarseness, patent airway Eyes:     General: No scleral icterus.       Right eye: No discharge.        Left eye: No discharge.     Conjunctiva/sclera: Conjunctivae normal.  Cardiovascular:     Rate and Rhythm: Normal rate and regular rhythm.     Heart sounds: No murmur heard. Pulmonary:     Effort: Pulmonary  effort is normal. No respiratory distress.     Breath sounds: Normal breath sounds. No stridor. No wheezing, rhonchi or rales.  Chest:     Chest wall: No tenderness.  Abdominal:     General: Abdomen is flat.     Palpations: Abdomen is soft.     Tenderness: There is no abdominal tenderness. There is no guarding or rebound.  Musculoskeletal:        General: Normal range of motion.     Cervical back: Normal range of motion and neck supple. No rigidity.     Right lower leg: No edema.     Left lower leg: No edema.  Skin:    General: Skin is warm and dry.     Capillary Refill: Capillary refill takes less than 2 seconds.  Neurological:     Mental Status: She is alert. Mental status is at baseline.  Psychiatric:  Behavior: Behavior normal.     ED Results / Procedures / Treatments   Labs (all labs ordered are listed, but only abnormal results are displayed) Labs Reviewed  RESP PANEL BY RT-PCR (RSV, FLU A&B, COVID)  [id number]  GROUP A STREP BY PCR    EKG None  Radiology DG Chest 2 View  Result Date: 08/26/2022 CLINICAL DATA:  Cough. Chills, congestion and body aches since yesterday. EXAM: CHEST - 2 VIEW COMPARISON:  05/28/2022. FINDINGS: Clear lungs. Normal heart size and mediastinal contours. No pleural effusion or pneumothorax. Visualized bones and upper abdomen are unremarkable. IMPRESSION: No evidence of acute cardiopulmonary disease. Electronically Signed   By: Emmit Alexanders M.D.   On: 08/26/2022 09:35    Procedures Procedures   Medications Ordered in ED Medications  acetaminophen (TYLENOL) tablet 1,000 mg (1,000 mg Oral Given 08/26/22 1011)    ED Course/ Medical Decision Making/ A&P                           Medical Decision Making Amount and/or Complexity of Data Reviewed Labs: ordered. Decision-making details documented in ED Course. Radiology: ordered. Decision-making details documented in ED Course.   42 year old female presenting to ED c/o upper  respiratory symptoms. Differential including but not limited to influenza, COVID-19, RSV, strep pharyngitis, viral pharyngitis, laryngitis, pneumonia, asthma exacerbation, peritonsillar abscess. Pt afebrile with stable vital signs in ED today though does self-report fever at home. Physical exam most remarkable for voice hoarseness and posterior pharyngeal erythema with normal heart sounds, lungs clear to auscultation, unremarkable tympanic membranes bilaterally, and no other significant findings. Benign abdominal exam with no tenderness, rebound, guarding, or peritoneal signs. Viral swabs, strep, and chest x-ray ordered which were all negative. Pt with diffuse body aches, offered pain medication during ED stay, pt requested Tylenol and this was provided. With physical exam findings, high suspicion for laryngitis secondary to viral illness with no exudates, swelling, or signs of airway compromise or peritonsillar abscess on exam. Instructions for symptomatic treatment provided including vocal rest and over the counter medications for management of symptoms in addition to prescription for Zofran with report of nausea. ED return precautions given. Pt stable for discharge.   Final Clinical Impression(s) / ED Diagnoses Final diagnoses:  Laryngitis  Acute upper respiratory infection    Rx / DC Orders ED Discharge Orders          Ordered    ondansetron (ZOFRAN) 4 MG tablet  Every 8 hours PRN        08/26/22 1303              Suzzette Righter, PA-C 08/27/22 1022    Gareth Morgan, MD 08/27/22 2152

## 2022-08-26 NOTE — ED Triage Notes (Signed)
Body aches , cough , lost her voice . Congestion

## 2022-08-27 ENCOUNTER — Telehealth: Payer: Self-pay

## 2022-08-27 NOTE — Telephone Encounter (Signed)
Transition Care Management Unsuccessful Follow-up Telephone Call  Date of discharge and from where:  [facility] Ed 08/26/2022  Attempts:  1st Attempt  Reason for unsuccessful TCM follow-up call:  Left voice message Juanda Crumble, [place] Direct Dial [phone]

## 2022-08-28 NOTE — Telephone Encounter (Signed)
Transition Care Management Unsuccessful Follow-up Telephone Call  Date of discharge and from where:  [facility] Ed 08/26/2022  Attempts:  2nd Attempt  Reason for unsuccessful TCM follow-up call:  Left voice message Juanda Crumble, [place] Direct Dial [phone]

## 2022-08-29 NOTE — Telephone Encounter (Signed)
Transition Care Management Unsuccessful Follow-up Telephone Call  Date of discharge and from where:  [facility] ED 08/26/2022  Attempts:  3rd Attempt  Reason for unsuccessful TCM follow-up call:  Left voice message Juanda Crumble, [place] Direct Dial [phone]

## 2022-09-16 ENCOUNTER — Emergency Department (HOSPITAL_BASED_OUTPATIENT_CLINIC_OR_DEPARTMENT_OTHER): Payer: Self-pay

## 2022-09-16 ENCOUNTER — Emergency Department (HOSPITAL_BASED_OUTPATIENT_CLINIC_OR_DEPARTMENT_OTHER)
Admission: EM | Admit: 2022-09-16 | Discharge: 2022-09-16 | Disposition: A | Discharge status: Home / Self Care | Payer: Self-pay | Attending: Emergency Medicine | Admitting: Emergency Medicine

## 2022-09-16 ENCOUNTER — Encounter (HOSPITAL_BASED_OUTPATIENT_CLINIC_OR_DEPARTMENT_OTHER): Payer: Self-pay

## 2022-09-16 DIAGNOSIS — G43009 Migraine without aura, not intractable, without status migrainosus: Secondary | ICD-10-CM | POA: Insufficient documentation

## 2022-09-16 DIAGNOSIS — E119 Type 2 diabetes mellitus without complications: Secondary | ICD-10-CM | POA: Insufficient documentation

## 2022-09-16 DIAGNOSIS — J45909 Unspecified asthma, uncomplicated: Secondary | ICD-10-CM | POA: Insufficient documentation

## 2022-09-16 DIAGNOSIS — I10 Essential (primary) hypertension: Secondary | ICD-10-CM | POA: Insufficient documentation

## 2022-09-16 DIAGNOSIS — Z794 Long term (current) use of insulin: Secondary | ICD-10-CM | POA: Insufficient documentation

## 2022-09-16 DIAGNOSIS — Z79899 Other long term (current) drug therapy: Secondary | ICD-10-CM | POA: Insufficient documentation

## 2022-09-16 MED ORDER — ACETAMINOPHEN 500 MG PO TABS
1000.0000 mg | ORAL_TABLET | Freq: Once | ORAL | Status: AC
  Administered 2022-09-16: 1000 mg via ORAL
  Filled 2022-09-16: qty 2

## 2022-09-16 MED ORDER — KETOROLAC TROMETHAMINE 15 MG/ML IJ SOLN
15.0000 mg | Freq: Once | INTRAMUSCULAR | Status: DC
  Filled 2022-09-16: qty 1

## 2022-09-16 MED ORDER — SODIUM CHLORIDE 0.9 % IV BOLUS
1000.0000 mL | Freq: Once | INTRAVENOUS | Status: AC
  Administered 2022-09-16: 1000 mL via INTRAVENOUS

## 2022-09-16 MED ORDER — DIPHENHYDRAMINE HCL 50 MG/ML IJ SOLN
12.5000 mg | Freq: Once | INTRAMUSCULAR | Status: AC
  Administered 2022-09-16: 12.5 mg via INTRAVENOUS
  Filled 2022-09-16: qty 1

## 2022-09-16 MED ORDER — PROCHLORPERAZINE EDISYLATE 10 MG/2ML IJ SOLN
10.0000 mg | Freq: Once | INTRAMUSCULAR | Status: AC
  Administered 2022-09-16: 10 mg via INTRAVENOUS
  Filled 2022-09-16: qty 2

## 2022-09-16 NOTE — ED Notes (Signed)
VAN negative, stroke screen negative besides some numbness to right side.

## 2022-09-16 NOTE — ED Notes (Signed)
Called CT, updated that pt has hx of hysterectomy and no possible pregnancy, v/u

## 2022-09-16 NOTE — ED Notes (Signed)
Pt reports she is ready to leave, does not want to wait to finish fluid bolus.

## 2022-09-16 NOTE — Discharge Instructions (Signed)
We evaluated you for your migraine.  Your CT scan was negative and your symptoms improved in the emergency department.  Please follow-up closely with your primary doctor.  Please return if you have any recurrent symptoms, severe pain, vomiting, weakness in your arms or legs, or any other concerning symptoms.

## 2022-09-16 NOTE — ED Provider Notes (Signed)
[facility] Provider Note  CSN: [id number] Arrival date & time: 09/16/22 1251  Chief Complaint(s) Migraine and Numbness  HPI Joan [name] is a 42 y.o. female with history of diabetes, hyperlipidemia, hypertension, migraines presenting to the emergency department with migraine headache.  She reports headache for the past week.  She reports that it is gradual onset.  She reports photophobia, nausea and vomiting.  She reports it is worse on the right side of her head.  She reports it feels similar to prior migraines but worse.  She also reports associated numbness and tingling in her right face, right arm, right leg.  She reports that typically with her migraine she does get tingling in her right face but not in her arm or leg.  She denies any head trauma.  Denies any seizures, fevers, neck stiffness.   Past Medical History Past Medical History:  Diagnosis Date   Acute asthma exacerbation 04/10/2018   Acute non-recurrent maxillary sinusitis 09/25/2018   Asthma    Auditory hallucinations    only after anesthesia   BV (bacterial vaginosis) 02/13/2018   Diabetes mellitus without complication ([place])    diet controlled   Diabetes mellitus, type II ([place])    insulin, jardiance   Dyspnea    with exertion   Essential hypertension    Essential hypertension 11/24/2017   GERD (gastroesophageal reflux disease)    occasionally-NO MEDS   History of placement of ear tubes 05/20/2018   Hyperlipidemia    MDD (major depressive disorder)    Miscarriage    Nausea & vomiting 10/05/2019   PTSD (post-traumatic stress disorder)    Right lower quadrant abdominal pain 02/13/2018   Tachycardia    Patient Active Problem List   Diagnosis Date Noted   New onset of headaches 01/26/2021   Localized skin mass, lump, or swelling 01/26/2021   Diarrhea 01/11/2021   GAD (generalized anxiety disorder) 10/24/2020   Non-intractable vomiting 10/02/2020   Asthma exacerbation  08/03/2020   Acute URI 07/26/2020   COVID-19 virus infection 08/16/2019   Kidney stone 02/11/2019   Asthma 11/03/2018   Recurrent sinusitis 05/20/2018   Recurrent fever of unknown etiology 05/08/2018   Recurrent URI (upper respiratory infection) 05/08/2018   Tachycardia 05/04/2018   Missed period 04/29/2018   Diabetes mellitus type 2, uncontrolled 11/24/2017   Essential hypertension 11/24/2017   Hyperlipidemia 11/24/2017   Major depressive disorder, recurrent, severe without psychotic features ([place])    PTSD (post-traumatic stress disorder) 04/11/2015   Home Medication(s) Prior to Admission medications   Medication Sig Start Date End Date Taking? Authorizing Provider  albuterol (VENTOLIN HFA) 108 (90 Base) MCG/ACT inhaler INHALE 2 PUFFS BY MOUTH EVERY 6 HOURS AS NEEDED FOR SHORTNESS OF BREATH AND WHEEZING 10/10/20   Pleas Koch, NP  benzonatate (TESSALON) 200 MG capsule Take 1 capsule (200 mg total) by mouth 3 (three) times daily as needed for cough. 04/16/21   Rodriguez-Southworth, Sunday Spillers, PA-C  blood glucose meter kit and supplies KIT Dispense based on patient and insurance preference. Use up three times daily as directed. (FOR ICD-9 250.00, 250.01). 10/03/20   Lucrezia Starch, MD  Dulaglutide (TRULICITY) 3 0000000 SOPN Inject 3 mg as directed once a week. For diabetes. 01/17/21   Pleas Koch, NP  HYDROcodone-acetaminophen (NORCO/VICODIN) 5-325 MG tablet Take 1 tablet by mouth every 4 (four) hours as needed. 04/10/22   Isla Pence, MD  insulin aspart protamine - aspart (NOVOLOG 70/30 FLEXPEN RELION) (70-30) 100  UNIT/ML FlexPen Inject 40 units into the skin every morning and 36 units every evening for diabetes. 01/17/21   Pleas Koch, NP  Insulin Pen Needle (PEN NEEDLES) 31G X 6 MM MISC Use with insulin as directed. 10/03/20   Lucrezia Starch, MD  losartan (COZAAR) 25 MG tablet Take 1 tablet (25 mg total) by mouth daily. For blood pressure. 02/08/20   Pleas Koch, NP   molnupiravir EUA 200 MG CAPS Take 4 capsules (800 mg total) by mouth 2 (two) times daily. 04/16/21   Rodriguez-Southworth, Sunday Spillers, PA-C  montelukast (SINGULAIR) 10 MG tablet Take 10 mg by mouth at bedtime.    [provider]  predniSONE (DELTASONE) 50 MG tablet Take 1 tablet (50 mg total) by mouth daily with breakfast. 04/10/22   Isla Pence, MD  rosuvastatin (CRESTOR) 10 MG tablet Take 1 tablet (10 mg total) by mouth every evening. For cholesterol. 10/24/20   Pleas Koch, NP  sertraline (ZOLOFT) 50 MG tablet Take 1 tablet (50 mg total) by mouth daily. For anxiety and depression 01/11/21   Pleas Koch, NP  SUMAtriptan (IMITREX) 50 MG tablet Take 1 tablet at migraine onset. May repeat in 2 hours if headache persists or recurs. Do not exceed 2 tablets in 24 hours. 01/30/21   Pleas Koch, NP                                                                                                                                    Past Surgical History Past Surgical History:  Procedure Laterality Date   ABDOMINAL HYSTERECTOMY     ADENOIDECTOMY     CHOLECYSTECTOMY  2009   CYSTOSCOPY N/A 07/31/2018   Procedure: CYSTOSCOPY;  Surgeon: Benjaman Kindler, MD;  Location: [facility] ORS;  Service: Gynecology;  Laterality: N/A;   LAPAROSCOPIC BILATERAL SALPINGECTOMY Bilateral 07/31/2018   Procedure: LAPAROSCOPIC BILATERAL SALPINGECTOMY;  Surgeon: Benjaman Kindler, MD;  Location: [facility] ORS;  Service: Gynecology;  Laterality: Bilateral;   LAPAROSCOPIC HYSTERECTOMY N/A 07/31/2018   Procedure: HYSTERECTOMY TOTAL LAPAROSCOPIC;  Surgeon: Benjaman Kindler, MD;  Location: [facility] ORS;  Service: Gynecology;  Laterality: N/A;   LAPAROSCOPY N/A 06/07/2019   Procedure: LAPAROSCOPY OPERATIVE, WITH PERITONEAL BIOPSIES;  Surgeon: Benjaman Kindler, MD;  Location: [facility] ORS;  Service: Gynecology;  Laterality: N/A;   LYSIS OF ADHESION N/A 07/31/2018   Procedure: LYSIS OF ADHESION;  Surgeon: Benjaman Kindler, MD;   Location: [facility] ORS;  Service: Gynecology;  Laterality: N/A;   OVARY SURGERY Right    cyst removed a while ago   TONSILLECTOMY     tubes in ear     Family History Family History  Problem Relation Age of Onset   Asthma Mother    Diabetes Mother    Hyperlipidemia Mother    Hypertension Mother    Diabetes Father    Hyperlipidemia Father    Hypertension Father    Diabetes Brother    Depression  Brother    Alcohol abuse Brother    Depression Maternal Grandmother    Stroke Paternal Grandmother    Breast cancer Neg Hx     Social History Social History   Tobacco Use   Smoking status: Never   Smokeless tobacco: Never  Vaping Use   Vaping Use: Never used  Substance Use Topics   Alcohol use: No   Drug use: No   Allergies Ibuprofen, Ciprofloxacin, Metformin and related, Penicillins, Sulfa antibiotics, Other, and Shellfish allergy  Review of Systems Review of Systems  All other systems reviewed and are negative.   Physical Exam Vital Signs  I have reviewed the triage vital signs BP 111/64   Pulse 75   Temp 98.4 F (36.9 C) (Oral)   Resp 20   Ht 5' 6"$  (1.676 m)   Wt 124.7 kg   LMP 07/24/2018 (Exact Date) Comment: surgery 07/31/2018  SpO2 97%   BMI 44.39 kg/m  Physical Exam Vitals and nursing note reviewed.  Constitutional:      General: She is not in acute distress.    Appearance: She is well-developed.  HENT:     Head: Normocephalic and atraumatic.     Mouth/Throat:     Mouth: Mucous membranes are moist.  Eyes:     Pupils: Pupils are equal, round, and reactive to light.  Cardiovascular:     Rate and Rhythm: Normal rate and regular rhythm.     Heart sounds: No murmur heard. Pulmonary:     Effort: Pulmonary effort is normal. No respiratory distress.     Breath sounds: Normal breath sounds.  Abdominal:     General: Abdomen is flat.     Palpations: Abdomen is soft.     Tenderness: There is no abdominal tenderness.  Musculoskeletal:        General: No  tenderness.     Right lower leg: No edema.     Left lower leg: No edema.  Skin:    General: Skin is warm and dry.  Neurological:     General: No focal deficit present.     Mental Status: She is alert. Mental status is at baseline.     Comments: Cranial nerves II through XII intact, strength 5 out of 5 in the bilateral upper and lower extremities, no sensory deficit to light touch although patient does report tingling sensation in the right face, right arm, right leg  Psychiatric:        Mood and Affect: Mood normal.        Behavior: Behavior normal.     ED Results and Treatments Labs (all labs ordered are listed, but only abnormal results are displayed) Labs Reviewed - No data to display                                                                                                                        Radiology CT Head Wo Contrast  Result Date: 09/16/2022 CLINICAL DATA:  Headache, neuro deficit. Migraine for  8 days, nausea, and right-sided facial numbness. EXAM: CT HEAD WITHOUT CONTRAST TECHNIQUE: Contiguous axial images were obtained from the base of the skull through the vertex without intravenous contrast. RADIATION DOSE REDUCTION: This exam was performed according to the departmental dose-optimization program which includes automated exposure control, adjustment of the mA and/or kV according to patient size and/or use of iterative reconstruction technique. COMPARISON:  Head CT 01/31/2021 FINDINGS: The study is mildly motion degraded. Brain: There is no evidence of an acute infarct, intracranial hemorrhage, mass, midline shift, or extra-axial fluid collection. The ventricles and sulci are normal. Vascular: No hyperdense vessel. Skull: No fracture or suspicious osseous lesion. Sinuses/Orbits: Visualized paranasal sinuses and mastoid air cells are clear. Grossly unremarkable orbits. Other: None. IMPRESSION: Negative head CT. Electronically Signed   By: Logan Bores M.D.   On: 09/16/2022  14:56    Pertinent labs & imaging results that were available during my care of the patient were reviewed by me and considered in my medical decision making (see MDM for details).  Medications Ordered in ED Medications  sodium chloride 0.9 % bolus 1,000 mL (1,000 mLs Intravenous New Bag/Given 09/16/22 1427)  prochlorperazine (COMPAZINE) injection 10 mg (10 mg Intravenous Given 09/16/22 1428)  diphenhydrAMINE (BENADRYL) injection 12.5 mg (12.5 mg Intravenous Given 09/16/22 1428)  acetaminophen (TYLENOL) tablet 1,000 mg (1,000 mg Oral Given 09/16/22 1407)                                                                                                                                     Procedures Procedures  (including critical care time)  Medical Decision Making / ED Course   MDM:  42 year old female presenting to the emergency department with headache.  Patient well-appearing, neurologic exam is reassuring.  Symptoms seem similar to previous migraines although worse.  Given worsening symptoms than typical, obtain CT scan which was negative for acute intracranial process such as bleeding, intracranial tumor,, skull fracture.  Doubt occult subarachnoid hemorrhage given gradual onset of symptoms.  Patient reports symptoms have vastly improved following Tylenol, Compazine, Benadryl.  Offered Toradol, patient refused and reports she is ready to go home. Will discharge patient to home. All questions answered. Patient comfortable with plan of discharge. Return precautions discussed with patient and specified on the after visit summary.       Additional history obtained: -Additional history obtained from spouse -External records from outside source obtained and reviewed including: Chart review including previous notes, labs, imaging, consultation notes including ED visit for laryngitis 08/26/22   Imaging Studies ordered: I ordered imaging studies including CT head On my interpretation imaging  demonstrates no acute process I independently visualized and interpreted imaging. I agree with the radiologist interpretation   Medicines ordered and prescription drug management: Meds ordered this encounter  Medications   sodium chloride 0.9 % bolus 1,000 mL   prochlorperazine (COMPAZINE) injection 10 mg   diphenhydrAMINE (BENADRYL) injection 12.5 mg  acetaminophen (TYLENOL) tablet 1,000 mg   DISCONTD: ketorolac (TORADOL) 15 MG/ML injection 15 mg    -I have reviewed the patients home medicines and have made adjustments as needed   Social Determinants of Health:  Diagnosis or treatment significantly limited by social determinants of health: obesity   Reevaluation: After the interventions noted above, I reevaluated the patient and found that they have resolved  Co morbidities that complicate the patient evaluation  Past Medical History:  Diagnosis Date   Acute asthma exacerbation 04/10/2018   Acute non-recurrent maxillary sinusitis 09/25/2018   Asthma    Auditory hallucinations    only after anesthesia   BV (bacterial vaginosis) 02/13/2018   Diabetes mellitus without complication ([place])    diet controlled   Diabetes mellitus, type II ([place])    insulin, jardiance   Dyspnea    with exertion   Essential hypertension    Essential hypertension 11/24/2017   GERD (gastroesophageal reflux disease)    occasionally-NO MEDS   History of placement of ear tubes 05/20/2018   Hyperlipidemia    MDD (major depressive disorder)    Miscarriage    Nausea & vomiting 10/05/2019   PTSD (post-traumatic stress disorder)    Right lower quadrant abdominal pain 02/13/2018   Tachycardia       Dispostion: Disposition decision including need for hospitalization was considered, and patient discharged from emergency department.    Final Clinical Impression(s) / ED Diagnoses Final diagnoses:  Migraine without aura and without status migrainosus, not intractable     This chart was dictated using  voice recognition software.  Despite best efforts to proofread,  errors can occur which can change the documentation meaning.    Cristie Hem, MD 09/16/22 [phone]

## 2022-09-16 NOTE — ED Notes (Signed)
D/c paperwork reviewed with pt, including follow up care.  No questions or concerns voiced at time of d/c. Marland Kitchen Pt verbalized understanding, Ambulatory with family to ED exit, NAD.

## 2022-09-16 NOTE — ED Triage Notes (Signed)
C/o migraine x 8 days. States has had slight right sided facial numbness last night at 2230. States gets numbness with her migraines typically but not this bad. Taking excedrin without relief.

## 2022-09-17 ENCOUNTER — Telehealth: Payer: Self-pay

## 2022-09-17 NOTE — Transitions of Care (Post Inpatient/ED Visit) (Signed)
   09/17/2022  Name: Joan [name] MRN: [id number] DOB: 02-11-1981  Today's TOC FU Call Status: Today's TOC FU Call Status:: Successful TOC FU Call Competed TOC FU Call Complete Date: 09/17/22  Transition Care Management Follow-up Telephone Call Date of Discharge: 09/16/22 Discharge Facility: [facility] Type of Discharge: Emergency Department Reason for ED Visit: Other: (Migraine without aura and without status migrainosus, not intractable) How have you been since you were released from the hospital?: Better Any questions or concerns?: Yes Patient Questions/Concerns:: wants referral to Neurologist- migraines getting worse and more frequent  Items Reviewed: Did you receive and understand the discharge instructions provided?: Yes Medications obtained and verified?: Yes (Medications Reviewed) Any new allergies since your discharge?: No Dietary orders reviewed?: NA Do you have support at home?: Yes  Home Care and Equipment/Supplies: [place] Ordered?: NA Any new equipment or medical supplies ordered?: NA  Functional Questionnaire: Do you need assistance with bathing/showering or dressing?: No Do you need assistance with meal preparation?: No Do you need assistance with eating?: No Do you have difficulty maintaining continence: No Do you need assistance with getting out of bed/getting out of a chair/moving?: No Do you have difficulty managing or taking your medications?: No  Folllow up appointments reviewed: PCP Follow-up appointment confirmed?: Yes Date of PCP follow-up appointment?: 09/26/22 Follow-up Provider: Dr Carlis Abbott [facility] Follow-up appointment confirmed?: NA Do you need transportation to your follow-up appointment?: No Do you understand care options if your condition(s) worsen?: Yes-patient verbalized understanding    [place] LPN [place] Direct Dial [phone]

## 2022-09-26 ENCOUNTER — Ambulatory Visit (INDEPENDENT_AMBULATORY_CARE_PROVIDER_SITE_OTHER): Payer: Self-pay | Admitting: Primary Care

## 2022-09-26 ENCOUNTER — Other Ambulatory Visit: Payer: Self-pay | Admitting: Primary Care

## 2022-09-26 ENCOUNTER — Encounter: Payer: Self-pay | Admitting: Primary Care

## 2022-09-26 VITALS — BP 130/90 | HR 111 | Temp 97.7°F | Ht 66.0 in | Wt 284.5 lb

## 2022-09-26 DIAGNOSIS — I10 Essential (primary) hypertension: Secondary | ICD-10-CM

## 2022-09-26 DIAGNOSIS — R000 Tachycardia, unspecified: Secondary | ICD-10-CM

## 2022-09-26 DIAGNOSIS — E785 Hyperlipidemia, unspecified: Secondary | ICD-10-CM

## 2022-09-26 DIAGNOSIS — Z23 Encounter for immunization: Secondary | ICD-10-CM

## 2022-09-26 DIAGNOSIS — E1165 Type 2 diabetes mellitus with hyperglycemia: Secondary | ICD-10-CM

## 2022-09-26 DIAGNOSIS — G43709 Chronic migraine without aura, not intractable, without status migrainosus: Secondary | ICD-10-CM

## 2022-09-26 MED ORDER — VALSARTAN 80 MG PO TABS: 80.0000 mg | ORAL_TABLET | Freq: Every day | ORAL | 0 refills | Status: DC

## 2022-09-26 NOTE — Assessment & Plan Note (Addendum)
Repeat A1c pending. Based on glucose levels I suspect her A1c is at least 12 or greater.  Continue Novolin 70/30, 50 units twice daily for now as this is what she can afford.  In 1 week her insurance will resume and at that point we will try Basaglar or Lantus and Trulicity. Close follow-up discussed with patient.  She agrees.  Avoid metformin given lactic acidosis.

## 2022-09-26 NOTE — Assessment & Plan Note (Signed)
Consider propranolol ER 80 mg daily for headache prevention. Follow-up in 2 weeks.
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
## 2022-09-26 NOTE — Patient Instructions (Signed)
Start valsartan 80 mg once daily for blood pressure.  Stop by the lab prior to leaving today. I will notify you of your results once received.   It was a pleasure to see you today!

## 2022-09-26 NOTE — Assessment & Plan Note (Signed)
Uncontrolled. Could be contributing to headaches.  Start valsartan 80 mg daily. Will plan to see her back in 2 weeks for blood pressure follow-up and BMP.

## 2022-09-26 NOTE — Assessment & Plan Note (Signed)
Repeat lipid panel pending. Consider statin therapy.

## 2022-09-26 NOTE — Assessment & Plan Note (Signed)
Hypertension likely contributing.  Will start by treating hypertension. Consider propranolol ER 80 mg daily for headache prevention and sumatriptan 50 mg as needed for abortive treatment.  Close follow-up in 2 weeks.

## 2022-09-27 LAB — BASIC METABOLIC PANEL
BUN: 19 mg/dL (ref 6–23)
CO2: 27 mEq/L (ref 19–32)
Calcium: 9.6 mg/dL (ref 8.4–10.5)
Chloride: 96 mEq/L (ref 96–112)
Creatinine, Ser: 0.75 mg/dL (ref 0.40–1.20)
GFR: 97.75 mL/min (ref >60.00)
Glucose, Bld: 334 mg/dL — ABNORMAL HIGH (ref 70–99)
Potassium: 4.7 mEq/L (ref 3.5–5.1)
Sodium: 131 mEq/L — ABNORMAL LOW (ref 135–145)

## 2022-09-27 LAB — HEMOGLOBIN A1C: Hgb A1c MFr Bld: 13.7 % — ABNORMAL HIGH (ref 4.6–6.5)

## 2022-09-27 LAB — LIPID PANEL
Cholesterol: 270 mg/dL — ABNORMAL HIGH (ref 0–200)
HDL: 55.5 mg/dL (ref >39.00)
Total CHOL/HDL Ratio: 5
Triglycerides: 609 mg/dL — ABNORMAL HIGH (ref 0.0–149.0)

## 2022-09-27 LAB — LDL CHOLESTEROL, DIRECT: Direct LDL: 132 mg/dL

## 2022-09-28 ENCOUNTER — Other Ambulatory Visit: Payer: Self-pay | Admitting: Primary Care

## 2022-09-28 DIAGNOSIS — E785 Hyperlipidemia, unspecified: Secondary | ICD-10-CM

## 2022-09-28 DIAGNOSIS — E1165 Type 2 diabetes mellitus with hyperglycemia: Secondary | ICD-10-CM

## 2022-09-28 MED ORDER — TRULICITY 0.75 MG/0.5ML ~~LOC~~ SOAJ: 0.7500 mg | SUBCUTANEOUS | 0 refills | Status: DC

## 2022-09-28 MED ORDER — ATORVASTATIN CALCIUM 20 MG PO TABS: 20.0000 mg | ORAL_TABLET | Freq: Every day | ORAL | 0 refills | Status: DC

## 2022-10-03 ENCOUNTER — Ambulatory Visit
Admission: EM | Admit: 2022-10-03 | Discharge: 2022-10-03 | Disposition: A | Discharge status: Home / Self Care | Payer: Self-pay | Attending: Emergency Medicine | Admitting: Emergency Medicine

## 2022-10-03 DIAGNOSIS — Z20828 Contact with and (suspected) exposure to other viral communicable diseases: Secondary | ICD-10-CM

## 2022-10-03 DIAGNOSIS — Z794 Long term (current) use of insulin: Secondary | ICD-10-CM

## 2022-10-03 DIAGNOSIS — E1165 Type 2 diabetes mellitus with hyperglycemia: Secondary | ICD-10-CM

## 2022-10-03 DIAGNOSIS — R81 Glycosuria: Secondary | ICD-10-CM

## 2022-10-03 DIAGNOSIS — R1084 Generalized abdominal pain: Secondary | ICD-10-CM

## 2022-10-03 LAB — POCT URINALYSIS DIP (MANUAL ENTRY)
Bilirubin, UA: NEGATIVE
Blood, UA: NEGATIVE
Glucose, UA: 500 mg/dL — AB
Ketones, POC UA: NEGATIVE mg/dL
Leukocytes, UA: NEGATIVE
Nitrite, UA: NEGATIVE
Protein Ur, POC: NEGATIVE mg/dL
Spec Grav, UA: 1.015 (ref 1.010–1.025)
Urobilinogen, UA: 0.2 E.U./dL
pH, UA: 7 (ref 5.0–8.0)

## 2022-10-03 LAB — POCT FASTING CBG KUC MANUAL ENTRY: POCT Glucose (KUC): 456 mg/dL — AB (ref 70–99)

## 2022-10-03 LAB — POCT INFLUENZA A/B
Influenza A, POC: NEGATIVE
Influenza B, POC: NEGATIVE

## 2022-10-03 NOTE — Discharge Instructions (Addendum)
Your fingerstick blood sugar is very high 456. Go to the emergency department for evaluation of your very high blood sugar and abdominal pain.

## 2022-10-03 NOTE — ED Triage Notes (Signed)
Patient to Urgent Care with complaints of dry cough that started last night, headache x2 days, and abdominal pain after eating (nauseated). Denies any vomiting/ diarrhea. Reports feeling fatigued.   Denies any known fevers. Reports she has had chills. Works at a facility with multiple flu positive residents.

## 2022-10-03 NOTE — ED Notes (Signed)
Patient is being discharged from the Urgent Care and sent to the Emergency Department via POV . Per Barkley Boards NP, patient is in need of higher level of care due to hyperglycemia. Patient is aware and verbalizes understanding of plan of care.  Vitals:   10/03/22 0943  BP: (!) 136/92  Pulse: 97  Resp: 17  Temp: 98 F (36.7 C)  SpO2: 98%

## 2022-10-03 NOTE — ED Provider Notes (Signed)
[name]    CSN: [id number] Arrival date & time: 10/03/22  [id number]      History   Chief Complaint Chief Complaint  Patient presents with   Cough   Headache   Abdominal Pain    HPI Joan [name] is a 42 y.o. female.  Patient presents with chills, fatigue, headache, cough, abdominal pain x 1-2 days.  She reports exposure to flu at work.  She denies fever, rash, sore throat, wheezing, shortness of breath, chest pain, vomiting, diarrhea, dysuria, or other symptoms.    Patient was [facility] at [facility] ED on 09/16/2022; diagnosed with migraine headache; negative head CT; treated with Tylenol, Compazine, Benadryl.     She was seen by her PCP on 09/26/2022; diagnosed with uncontrolled diabetes, hyperlipidemia, hypertension, chronic migraine headaches, tachycardia; Her A1c at this visit was 13.7.  She was started on Trulicity and Lipitor on 09/28/2022.  Her medical history also includes asthma.  The history is provided by the patient and medical records.    Past Medical History:  Diagnosis Date   Acute asthma exacerbation 04/10/2018   Acute non-recurrent maxillary sinusitis 09/25/2018   Asthma    Asthma exacerbation 08/03/2020   Auditory hallucinations    only after anesthesia   BV (bacterial vaginosis) 02/13/2018   COVID-19 virus infection 08/16/2019   Diabetes mellitus without complication ([place])    diet controlled   Diabetes mellitus, type II ([place])    insulin, jardiance   Dyspnea    with exertion   Essential hypertension    Essential hypertension 11/24/2017   GERD (gastroesophageal reflux disease)    occasionally-NO MEDS   History of placement of ear tubes 05/20/2018   Hyperlipidemia    Kidney stone 02/11/2019   Localized skin mass, lump, or swelling 01/26/2021   MDD (major depressive disorder)    Miscarriage    Nausea & vomiting 10/05/2019   PTSD (post-traumatic stress disorder)    Right lower quadrant abdominal pain 02/13/2018   Tachycardia     Patient Active  Problem List   Diagnosis Date Noted   Migraines 01/26/2021   GAD (generalized anxiety disorder) 10/24/2020   Asthma 11/03/2018   Recurrent sinusitis 05/20/2018   Recurrent fever of unknown etiology 05/08/2018   Recurrent URI (upper respiratory infection) 05/08/2018   Tachycardia 05/04/2018   Uncontrolled type 2 diabetes mellitus with hyperglycemia ([place]) 11/24/2017   Essential hypertension 11/24/2017   Hyperlipidemia 11/24/2017   Major depressive disorder, recurrent, severe without psychotic features ([place])    PTSD (post-traumatic stress disorder) 04/11/2015    Past Surgical History:  Procedure Laterality Date   ABDOMINAL HYSTERECTOMY     ADENOIDECTOMY     CHOLECYSTECTOMY  2009   CYSTOSCOPY N/A 07/31/2018   Procedure: CYSTOSCOPY;  Surgeon: Benjaman Kindler, MD;  Location: [facility] ORS;  Service: Gynecology;  Laterality: N/A;   LAPAROSCOPIC BILATERAL SALPINGECTOMY Bilateral 07/31/2018   Procedure: LAPAROSCOPIC BILATERAL SALPINGECTOMY;  Surgeon: Benjaman Kindler, MD;  Location: [facility] ORS;  Service: Gynecology;  Laterality: Bilateral;   LAPAROSCOPIC HYSTERECTOMY N/A 07/31/2018   Procedure: HYSTERECTOMY TOTAL LAPAROSCOPIC;  Surgeon: Benjaman Kindler, MD;  Location: [facility] ORS;  Service: Gynecology;  Laterality: N/A;   LAPAROSCOPY N/A 06/07/2019   Procedure: LAPAROSCOPY OPERATIVE, WITH PERITONEAL BIOPSIES;  Surgeon: Benjaman Kindler, MD;  Location: [facility] OR[facility];  Service: Gynecology;  Laterality: N/A;   LYSIS OF ADHESION N/A 07/31/2018   Procedure: LYSIS OF ADHESION;  Surgeon: Benjaman Kindler, MD;  Location: [facility] ORS;  Service: Gynecology;  Laterality: N/A;   OVARY SURGERY  Right    cyst removed a while ago   TONSILLECTOMY     tubes in ear      OB History   No obstetric history on file.      Home Medications    Prior to Admission medications   Medication Sig Start Date End Date Taking? Authorizing Provider  albuterol (VENTOLIN HFA) 108 (90 Base) MCG/ACT inhaler INHALE 2 PUFFS BY MOUTH  EVERY 6 HOURS AS NEEDED FOR SHORTNESS OF BREATH AND WHEEZING Patient not taking: Reported on 09/17/2022 10/10/20   Pleas Koch, NP  atorvastatin (LIPITOR) 20 MG tablet Take 1 tablet (20 mg total) by mouth daily. for cholesterol. 09/28/22   Pleas Koch, NP  blood glucose meter kit and supplies KIT Dispense based on patient and insurance preference. Use up three times daily as directed. (FOR ICD-9 250.00, 250.01). Patient not taking: Reported on 09/17/2022 10/03/20   Lucrezia Starch, MD  Dulaglutide (TRULICITY) [id number] SOPN Inject 0.75 mg into the skin once a week. for diabetes. 09/28/22   Pleas Koch, NP  insulin aspart protamine - aspart (NOVOLOG 70/30 FLEXPEN RELION) (70-30) 100 UNIT/ML FlexPen Inject 40 units into the skin every morning and 36 units every evening for diabetes. 01/17/21   Pleas Koch, NP  Insulin Pen Needle (PEN NEEDLES) 31G X 6 MM MISC Use with insulin as directed. Patient not taking: Reported on 09/17/2022 10/03/20   Lucrezia Starch, MD  valsartan (DIOVAN) 80 MG tablet Take 1 tablet (80 mg total) by mouth daily. for blood pressure. 09/26/22   Pleas Koch, NP    Family History Family History  Problem Relation Age of Onset   Asthma Mother    Diabetes Mother    Hyperlipidemia Mother    Hypertension Mother    Diabetes Father    Hyperlipidemia Father    Hypertension Father    Diabetes Brother    Depression Brother    Alcohol abuse Brother    Depression Maternal Grandmother    Stroke Paternal Grandmother    Breast cancer Neg Hx     Social History Social History   Tobacco Use   Smoking status: Never   Smokeless tobacco: Never  Vaping Use   Vaping Use: Never used  Substance Use Topics   Alcohol use: No   Drug use: No     Allergies   Ibuprofen, Ciprofloxacin, Metformin and related, Penicillins, Sulfa antibiotics, Other, and Shellfish allergy   Review of Systems Review of Systems  Constitutional:  Positive for chills and  fatigue. Negative for fever.  HENT:  Positive for ear pain. Negative for sore throat.   Respiratory:  Positive for cough. Negative for shortness of breath and wheezing.   Cardiovascular:  Negative for chest pain and palpitations.  Gastrointestinal:  Positive for abdominal pain. Negative for constipation, diarrhea, nausea and vomiting.  Genitourinary:  Negative for dysuria, flank pain, frequency, hematuria, pelvic pain and vaginal discharge.  Skin:  Negative for rash.  Neurological:  Positive for headaches. Negative for dizziness, weakness and numbness.     Physical Exam Triage Vital Signs ED Triage Vitals  Enc Vitals Group     BP      Pulse      Resp      Temp      Temp src      SpO2      Weight      Height      Head Circumference      Peak Flow  Pain Score      Pain Loc      Pain Edu?      Excl. in [place]?    No data found.  Updated Vital Signs BP (!) 136/92   Pulse 97   Temp 98 F (36.7 C)   Resp 17   LMP 07/24/2018 (Exact Date) Comment: surgery 07/31/2018  SpO2 98%   Visual Acuity Right Eye Distance:   Left Eye Distance:   Bilateral Distance:    Right Eye Near:   Left Eye Near:    Bilateral Near:     Physical Exam Vitals and nursing note reviewed.  Constitutional:      General: She is not in acute distress.    Appearance: She is well-developed. She is obese. She is not ill-appearing.  HENT:     Right Ear: Tympanic membrane normal.     Left Ear: Tympanic membrane normal.     Nose: Nose normal.     Mouth/Throat:     Mouth: Mucous membranes are moist.     Pharynx: Oropharynx is clear.  Cardiovascular:     Rate and Rhythm: Normal rate and regular rhythm.     Heart sounds: Normal heart sounds.  Pulmonary:     Effort: Pulmonary effort is normal. No respiratory distress.     Breath sounds: Normal breath sounds.  Abdominal:     General: Bowel sounds are normal.     Palpations: Abdomen is soft.     Tenderness: There is no abdominal tenderness. There is  no right CVA tenderness, left CVA tenderness, guarding or rebound.  Musculoskeletal:     Cervical back: Neck supple.  Skin:    General: Skin is warm and dry.  Neurological:     General: No focal deficit present.     Mental Status: She is alert and oriented to person, place, and time.     Sensory: No sensory deficit.     Motor: No weakness.     Gait: Gait normal.  Psychiatric:        Mood and Affect: Mood normal.        Behavior: Behavior normal.      UC Treatments / Results  Labs (all labs ordered are listed, but only abnormal results are displayed) Labs Reviewed  POCT URINALYSIS DIP (MANUAL ENTRY) - Abnormal; Notable for the following components:      Result Value   Glucose, UA =500 (*)    All other components within normal limits  POCT FASTING CBG [facility] MANUAL ENTRY - Abnormal; Notable for the following components:   POCT Glucose ([facility]) 456 (*)    All other components within normal limits  POCT INFLUENZA A/B    EKG   Radiology No results found.  Procedures Procedures (including critical care time)  Medications Ordered in UC Medications - No data to display  Initial Impression / Assessment and Plan / UC Course  I have reviewed the triage vital signs and the nursing notes.  Pertinent labs & imaging results that were available during my care of the patient were reviewed by me and considered in my medical decision making (see chart for details).    Hyperglycemia due to DM, Uncontrolled DM with hyperglycemia with long term use of insulin, Abdominal pain, Glucosuria, Exposure to influenza. CBG 456.  She ate a breakfast croissant approximately 1 hour PTA.  Sending patient to the ED for evaluation of her abdominal pain with high CBG.  Recent A1c was 13.7.  Patient has not taken her  insulin today and has not started Trulicity due to insurance issue.  Rapid flu negative.  Patient declines EMS and states she will drive herself to the ED if she decides to go.  She is resistant  to going to the ED.  She states she may go home and take her insulin.  Discussed risks of not going to the ED including DKA and death.  Education provided on hyperglycemia, DKA, and abdominal pain.    Final Clinical Impressions(s) / UC Diagnoses   Final diagnoses:  Hyperglycemia due to diabetes mellitus ([place])  Generalized abdominal pain  Uncontrolled diabetes mellitus with hyperglycemia, with long-term current use of insulin ([place])  Glucosuria  Exposure to influenza     Discharge Instructions      Your fingerstick blood sugar is very high 456. Go to the emergency department for evaluation of your very high blood sugar and abdominal pain.       ED Prescriptions   None    PDMP not reviewed this encounter.   Sharion Balloon, NP 10/03/22 1006

## 2022-10-08 ENCOUNTER — Telehealth: Payer: Self-pay | Admitting: Primary Care

## 2022-10-08 NOTE — Telephone Encounter (Signed)
Patient called in and stated that she needs a PA for her [employer]. She would like for it to go to [place], [place]. Thank you!

## 2022-10-08 NOTE — Telephone Encounter (Addendum)
PA has been submitted.  KeyLaurin Coder - [id number]   Awaiting determination

## 2022-10-10 NOTE — Telephone Encounter (Signed)
Checked cover my meds

## 2022-10-11 NOTE — Telephone Encounter (Signed)
Called and advised patient we are still waiting on determination from insurance.

## 2022-10-11 NOTE — Telephone Encounter (Signed)
Pt called checking on status of [employer] approval? Call back # [phone]

## 2022-10-15 NOTE — Telephone Encounter (Signed)
[facility] and verified status of PA. She stated that the PA was under review this is day 5 or 5-7 business day turn around time. Verified they have correct fax number. She will send over determination as soon as it is available.

## 2022-10-22 ENCOUNTER — Encounter (HOSPITAL_COMMUNITY): Payer: Self-pay | Admitting: Behavioral Health

## 2022-10-22 ENCOUNTER — Other Ambulatory Visit: Payer: Self-pay

## 2022-10-22 ENCOUNTER — Ambulatory Visit (HOSPITAL_COMMUNITY)
Admission: EM | Admit: 2022-10-22 | Discharge: 2022-10-22 | Disposition: A | Discharge status: Other Acute Inpatient Hospital | Payer: BLUE CROSS/BLUE SHIELD | Attending: Behavioral Health | Admitting: Behavioral Health

## 2022-10-22 ENCOUNTER — Encounter (HOSPITAL_COMMUNITY): Payer: Self-pay | Admitting: Emergency Medicine

## 2022-10-22 ENCOUNTER — Emergency Department (HOSPITAL_COMMUNITY)
Admission: EM | Admit: 2022-10-22 | Discharge: 2022-10-23 | Disposition: A | Discharge status: Other Acute Inpatient Hospital | Payer: BLUE CROSS/BLUE SHIELD | Attending: Student | Admitting: Student

## 2022-10-22 DIAGNOSIS — F333 Major depressive disorder, recurrent, severe with psychotic symptoms: Secondary | ICD-10-CM | POA: Diagnosis not present

## 2022-10-22 DIAGNOSIS — I10 Essential (primary) hypertension: Secondary | ICD-10-CM | POA: Insufficient documentation

## 2022-10-22 DIAGNOSIS — Z7984 Long term (current) use of oral hypoglycemic drugs: Secondary | ICD-10-CM | POA: Insufficient documentation

## 2022-10-22 DIAGNOSIS — F4310 Post-traumatic stress disorder, unspecified: Secondary | ICD-10-CM | POA: Diagnosis not present

## 2022-10-22 DIAGNOSIS — R45851 Suicidal ideations: Secondary | ICD-10-CM | POA: Diagnosis present

## 2022-10-22 DIAGNOSIS — E1165 Type 2 diabetes mellitus with hyperglycemia: Secondary | ICD-10-CM | POA: Insufficient documentation

## 2022-10-22 DIAGNOSIS — Z1152 Encounter for screening for COVID-19: Secondary | ICD-10-CM | POA: Diagnosis not present

## 2022-10-22 DIAGNOSIS — Z79899 Other long term (current) drug therapy: Secondary | ICD-10-CM | POA: Diagnosis not present

## 2022-10-22 DIAGNOSIS — R739 Hyperglycemia, unspecified: Secondary | ICD-10-CM

## 2022-10-22 DIAGNOSIS — Z9152 Personal history of nonsuicidal self-harm: Secondary | ICD-10-CM | POA: Insufficient documentation

## 2022-10-22 DIAGNOSIS — E871 Hypo-osmolality and hyponatremia: Secondary | ICD-10-CM | POA: Insufficient documentation

## 2022-10-22 DIAGNOSIS — F411 Generalized anxiety disorder: Secondary | ICD-10-CM | POA: Diagnosis not present

## 2022-10-22 DIAGNOSIS — Z794 Long term (current) use of insulin: Secondary | ICD-10-CM | POA: Insufficient documentation

## 2022-10-22 LAB — COMPREHENSIVE METABOLIC PANEL
ALT: 15 U/L (ref 0–44)
AST: 28 U/L (ref 15–41)
Albumin: 4.1 g/dL (ref 3.5–5.0)
Alkaline Phosphatase: 121 U/L (ref 38–126)
Anion gap: 11 (ref 5–15)
BUN: 14 mg/dL (ref 6–20)
CO2: 22 mmol/L (ref 22–32)
Calcium: 9.1 mg/dL (ref 8.9–10.3)
Chloride: 96 mmol/L — ABNORMAL LOW (ref 98–111)
Creatinine, Ser: 0.56 mg/dL (ref 0.44–1.00)
GFR, Estimated: >60 mL/min (ref >60)
Glucose, Bld: 334 mg/dL — ABNORMAL HIGH (ref 70–99)
Potassium: 5.5 mmol/L — ABNORMAL HIGH (ref 3.5–5.1)
Sodium: 129 mmol/L — ABNORMAL LOW (ref 135–145)
Total Bilirubin: 1.5 mg/dL — ABNORMAL HIGH (ref 0.3–1.2)
Total Protein: 6.7 g/dL (ref 6.5–8.1)

## 2022-10-22 LAB — BASIC METABOLIC PANEL
Anion gap: 8 (ref 5–15)
BUN: 17 mg/dL (ref 6–20)
CO2: 23 mmol/L (ref 22–32)
Calcium: 9.2 mg/dL (ref 8.9–10.3)
Chloride: 98 mmol/L (ref 98–111)
Creatinine, Ser: 0.71 mg/dL (ref 0.44–1.00)
GFR, Estimated: >60 mL/min (ref >60)
Glucose, Bld: 415 mg/dL — ABNORMAL HIGH (ref 70–99)
Potassium: 4 mmol/L (ref 3.5–5.1)
Sodium: 129 mmol/L — ABNORMAL LOW (ref 135–145)

## 2022-10-22 LAB — POCT URINE DRUG SCREEN - MANUAL ENTRY (I-SCREEN)
POC Amphetamine UR: NOT DETECTED
POC Buprenorphine (BUP): NOT DETECTED
POC Cocaine UR: NOT DETECTED
POC Marijuana UR: NOT DETECTED
POC Methadone UR: NOT DETECTED
POC Methamphetamine UR: NOT DETECTED
POC Morphine: NOT DETECTED
POC Oxazepam (BZO): NOT DETECTED
POC Oxycodone UR: NOT DETECTED
POC Secobarbital (BAR): NOT DETECTED

## 2022-10-22 LAB — CBC WITH DIFFERENTIAL/PLATELET
Abs Immature Granulocytes: 0.02 10*3/uL (ref 0.00–0.07)
Abs Immature Granulocytes: 0.02 10*3/uL (ref 0.00–0.07)
Basophils Absolute: 0 10*3/uL (ref 0.0–0.1)
Basophils Absolute: 0 10*3/uL (ref 0.0–0.1)
Basophils Relative: 1 %
Basophils Relative: 1 %
Eosinophils Absolute: 0.1 10*3/uL (ref 0.0–0.5)
Eosinophils Absolute: 0.1 10*3/uL (ref 0.0–0.5)
Eosinophils Relative: 1 %
Eosinophils Relative: 1 %
HCT: 47.5 % — ABNORMAL HIGH (ref 36.0–46.0)
HCT: 43.5 % (ref 36.0–46.0)
Hemoglobin: 16.1 g/dL — ABNORMAL HIGH (ref 12.0–15.0)
Hemoglobin: 15 g/dL (ref 12.0–15.0)
Immature Granulocytes: 0 %
Immature Granulocytes: 0 %
Lymphocytes Relative: 28 %
Lymphocytes Relative: 27 %
Lymphs Abs: 2 10*3/uL (ref 0.7–4.0)
Lymphs Abs: 2.3 10*3/uL (ref 0.7–4.0)
MCH: 27.9 pg (ref 26.0–34.0)
MCH: 28.4 pg (ref 26.0–34.0)
MCHC: 33.9 g/dL (ref 30.0–36.0)
MCHC: 34.5 g/dL (ref 30.0–36.0)
MCV: 82.3 fL (ref 80.0–100.0)
MCV: 82.2 fL (ref 80.0–100.0)
Monocytes Absolute: 0.3 10*3/uL (ref 0.1–1.0)
Monocytes Absolute: 0.5 10*3/uL (ref 0.1–1.0)
Monocytes Relative: 5 %
Monocytes Relative: 6 %
Neutro Abs: 4.6 10*3/uL (ref 1.7–7.7)
Neutro Abs: 5.3 10*3/uL (ref 1.7–7.7)
Neutrophils Relative %: 65 %
Neutrophils Relative %: 65 %
Platelets: 341 10*3/uL (ref 150–400)
Platelets: 309 10*3/uL (ref 150–400)
RBC: 5.77 MIL/uL — ABNORMAL HIGH (ref 3.87–5.11)
RBC: 5.29 MIL/uL — ABNORMAL HIGH (ref 3.87–5.11)
RDW: 13 % (ref 11.5–15.5)
RDW: 12.8 % (ref 11.5–15.5)
WBC: 7 10*3/uL (ref 4.0–10.5)
WBC: 8.2 10*3/uL (ref 4.0–10.5)
nRBC: 0 % (ref 0.0–0.2)
nRBC: 0 % (ref 0.0–0.2)

## 2022-10-22 LAB — LIPID PANEL
Cholesterol: 247 mg/dL — ABNORMAL HIGH (ref 0–200)
HDL: 65 mg/dL (ref >40)
LDL Cholesterol: 123 mg/dL — ABNORMAL HIGH (ref 0–99)
Total CHOL/HDL Ratio: 3.8 RATIO
Triglycerides: 297 mg/dL — ABNORMAL HIGH (ref <150)
VLDL: 59 mg/dL — ABNORMAL HIGH (ref 0–40)

## 2022-10-22 LAB — GLUCOSE, CAPILLARY
Glucose-Capillary: 456 mg/dL — ABNORMAL HIGH (ref 70–99)
Glucose-Capillary: 523 mg/dL (ref 70–99)

## 2022-10-22 LAB — RESP PANEL BY RT-PCR (RSV, FLU A&B, COVID)  RVPGX2
Influenza A by PCR: NEGATIVE
Influenza B by PCR: NEGATIVE
Resp Syncytial Virus by PCR: NEGATIVE
SARS Coronavirus 2 by RT PCR: NEGATIVE

## 2022-10-22 LAB — ETHANOL: Alcohol, Ethyl (B): <10 mg/dL (ref <10)

## 2022-10-22 LAB — URINALYSIS, ROUTINE W REFLEX MICROSCOPIC
Bilirubin Urine: NEGATIVE
Glucose, UA: >=500 mg/dL — AB
Hgb urine dipstick: NEGATIVE
Ketones, ur: 20 mg/dL — AB
Leukocytes,Ua: NEGATIVE
Nitrite: NEGATIVE
Protein, ur: NEGATIVE mg/dL
Specific Gravity, Urine: 1.039 — ABNORMAL HIGH (ref 1.005–1.030)
pH: 5 (ref 5.0–8.0)

## 2022-10-22 LAB — CBG MONITORING, ED
Glucose-Capillary: 469 mg/dL — ABNORMAL HIGH (ref 70–99)
Glucose-Capillary: 423 mg/dL — ABNORMAL HIGH (ref 70–99)
Glucose-Capillary: 258 mg/dL — ABNORMAL HIGH (ref 70–99)

## 2022-10-22 LAB — TSH: TSH: 1.925 u[IU]/mL (ref 0.350–4.500)

## 2022-10-22 LAB — POC SARS CORONAVIRUS 2 AG: SARSCOV2ONAVIRUS 2 AG: NEGATIVE

## 2022-10-22 LAB — MAGNESIUM: Magnesium: 2 mg/dL (ref 1.7–2.4)

## 2022-10-22 MED ORDER — LACTATED RINGERS IV BOLUS
1000.0000 mL | Freq: Once | INTRAVENOUS | Status: AC
  Administered 2022-10-22: 1000 mL via INTRAVENOUS

## 2022-10-22 MED ORDER — ACETAMINOPHEN 325 MG PO TABS: 650.0000 mg | ORAL_TABLET | Freq: Four times a day (QID) | ORAL | Status: DC | PRN

## 2022-10-22 MED ORDER — INSULIN ASPART 100 UNIT/ML IJ SOLN: 0.0000 [IU] | Freq: Every day | INTRAMUSCULAR | Status: DC

## 2022-10-22 MED ORDER — INSULIN ASPART 100 UNIT/ML IJ SOLN
5.0000 [IU] | Freq: Once | INTRAMUSCULAR | Status: AC
  Administered 2022-10-22: 5 [IU] via SUBCUTANEOUS

## 2022-10-22 MED ORDER — INSULIN ASPART 100 UNIT/ML IJ SOLN: 10.0000 [IU] | Freq: Once | INTRAMUSCULAR | Status: DC

## 2022-10-22 MED ORDER — IRBESARTAN 75 MG PO TABS
75.0000 mg | ORAL_TABLET | Freq: Every day | ORAL | Status: DC
  Administered 2022-10-22: 75 mg via ORAL
  Filled 2022-10-22: qty 1

## 2022-10-22 MED ORDER — ATORVASTATIN CALCIUM 10 MG PO TABS
20.0000 mg | ORAL_TABLET | Freq: Every day | ORAL | Status: DC
  Administered 2022-10-22: 20 mg via ORAL
  Filled 2022-10-22: qty 2

## 2022-10-22 MED ORDER — INSULIN ASPART 100 UNIT/ML IJ SOLN: 0.0000 [IU] | Freq: Three times a day (TID) | INTRAMUSCULAR | Status: DC

## 2022-10-22 MED ORDER — ALUM & MAG HYDROXIDE-SIMETH 200-200-20 MG/5ML PO SUSP: 30.0000 mL | ORAL | Status: DC | PRN

## 2022-10-22 MED ORDER — INSULIN ASPART 100 UNIT/ML IJ SOLN
15.0000 [IU] | Freq: Once | INTRAMUSCULAR | Status: AC
  Administered 2022-10-22: 15 [IU] via SUBCUTANEOUS

## 2022-10-22 MED ORDER — MAGNESIUM HYDROXIDE 400 MG/5ML PO SUSP: 30.0000 mL | Freq: Every day | ORAL | Status: DC | PRN

## 2022-10-22 NOTE — ED Provider Notes (Signed)
42yo female with hyperglycemia, sent by [facility]. Labs- needs repeat BMP and then dc back to [facility].  Physical Exam  BP 129/78   Pulse 84   Temp 97.6 F (36.4 C) (Oral)   Resp 19   Ht 5\' 6"  (1.676 m)   Wt 126.6 kg   LMP 07/24/2018 (Exact Date) Comment: surgery 07/31/2018  SpO2 100%   BMI 45.03 kg/m   Physical Exam  Procedures  Procedures  ED Course / MDM    Medical Decision Making Amount and/or Complexity of Data Reviewed Labs: ordered.  Risk Prescription drug management.   ***

## 2022-10-22 NOTE — Progress Notes (Signed)
Pt CBG prior to dinner noted to be elevated at 456. Notified NP's and orders placed for patient at 1735. Pt given 15 units of Insulin Aspart at 1739, please refer to [facility]. Patient had been given dinner meal during this time, and informed NP of this as well as aware pt non compliance.  Rechecked glucose to follow trend and noted at 1813 CBG 523, CRITICAL VALUE. NP Immediately notified. Also of note, lab work that was collected at 1400 still in process at this time. Writer reached out to [facility] who confirms sample is there, but due to high volume, not processed at this time. Discussed with NP and plan to send pt to [facility] ED for evalation and treatment due to continued elevated glucose.  ED CN given report  and aware  pt has pending bed at [facility], but elevated glucose will delay her transfer. Pt also made aware and non emergent EMS called for transport.

## 2022-10-22 NOTE — Progress Notes (Signed)
Pt was accepted to [facility] 10/23/2022, pending labs, covid PCR, and signed voluntary consent faxed to [phone]. Bed assignment: 307-2  Pt meets inpatient criteria per Asencion Islam, NP  Attending Physician will be Janine Limbo, MD  Report can be called to: - Adult unit: [phone]  Pt can arrive after 8 AM  Care Team Notified: [facility] Lynnda Shields, RN, Asencion Islam, NP, Leota Jacobsen, LPN, and Leonia Reader, RN  [place], [place]  10/22/2022 12:57 PM

## 2022-10-22 NOTE — ED Notes (Signed)
CBG 175. Not crossing over to chart.

## 2022-10-22 NOTE — ED Provider Notes (Signed)
1652 - CBG 456 1739 - patient received 15 units insulin aspart (one time dose) 1810 - CBG recheck 523  Patient will be transferred to [facility] related to elevated CBGs/need for medical clearance, accepting provider is Dr. Mayra Neer. Patient has been accepted to [facility] room 307-2, bed available tonight (10/22/2022) once medically cleared.

## 2022-10-22 NOTE — ED Triage Notes (Signed)
Pt BIB EMS from [facility] with c/o hyperglycemia. Pt was given 15 units of insulin and cbg increased. Pt was [place] for having SI and hearing voices. Pt states that she has not taken her medication for depression. Pt states that she talked to the psychiatrist at [facility] and is supposed to have a bed at [facility] after her glucose lowers.

## 2022-10-22 NOTE — ED Notes (Signed)
Report given to nurse, Bonita Quin, at [facility].

## 2022-10-22 NOTE — ED Provider Notes (Signed)
[facility] Provider Note   CSN: [id number] Arrival date & time: 10/22/22  1909     History  Chief Complaint  Patient presents with   Hyperglycemia   Suicidal    Joan [name] is a 42 y.o. female.  Patient with past psychiatric history of MDD, PTSD, and GAD, history of diabetes noncompliant with her medications, hyperlipidemia, hypertension --presents to the emergency department for evaluation of hyperglycemia.  Prior to arrival, patient was seen at behavioral health for suicidal ideations, worsening depression, and self harming behaviors.  She was accepted there, but sent to the ED after her blood sugars were found to be in the 500 range.  Patient denies associated abdominal pain or vomiting.  She does report eating less recently.  No urinary symptoms such as dysuria, increased frequency or urgency.  She does report drinking a lot of water.  Labs were drawn and sent earlier today at behavioral health.  Patient was treated with insulin at behavioral health, however her blood sugar did not decrease by much and was therefore sent to the ED.       Home Medications Prior to Admission medications   Medication Sig Start Date End Date Taking? Authorizing Provider  albuterol (VENTOLIN HFA) 108 (90 Base) MCG/ACT inhaler Inhale 1 puff into the lungs every 4 (four) hours as needed for wheezing or shortness of breath. 04/23/17   [provider]  atorvastatin (LIPITOR) 20 MG tablet Take 1 tablet (20 mg total) by mouth daily. for cholesterol. 09/28/22   Pleas Koch, NP  Dulaglutide (TRULICITY) [id number] [id number] SOPN Inject 0.75 mg into the skin once a week. for diabetes. Patient not taking: Reported on 10/22/2022 09/28/22   Pleas Koch, NP  insulin aspart protamine - aspart (NOVOLOG 70/30 FLEXPEN RELION) (70-30) 100 UNIT/ML FlexPen Inject 40 units into the skin every morning and 36 units every evening for diabetes. Patient taking  differently: 50 Units 2 (two) times daily before a meal. 01/17/21   Pleas Koch, NP  valsartan (DIOVAN) 80 MG tablet Take 1 tablet (80 mg total) by mouth daily. for blood pressure. 09/26/22   Pleas Koch, NP      Allergies    Ibuprofen, Ciprofloxacin, Metformin and related, Penicillins, Sulfa antibiotics, Other, Shellfish allergy, and Trazodone and nefazodone    Review of Systems   Review of Systems  Physical Exam Updated Vital Signs BP 129/86   Pulse (!) 109   Temp 97.7 F (36.5 C) (Oral)   Resp 18   Ht 5\' 6"  (1.676 m)   Wt 126.6 kg   LMP 07/24/2018 (Exact Date) Comment: surgery 07/31/2018  SpO2 100%   BMI 45.03 kg/m   Physical Exam Vitals and nursing note reviewed.  Constitutional:      General: She is not in acute distress.    Appearance: She is well-developed.  HENT:     Head: Normocephalic and atraumatic.     Right Ear: External ear normal.     Left Ear: External ear normal.     Nose: Nose normal.  Eyes:     Conjunctiva/sclera: Conjunctivae normal.  Cardiovascular:     Rate and Rhythm: Normal rate and regular rhythm.     Heart sounds: No murmur heard. Pulmonary:     Effort: No respiratory distress.     Breath sounds: No wheezing, rhonchi or rales.  Abdominal:     Palpations: Abdomen is soft.     Tenderness: There is no abdominal  tenderness. There is no guarding or rebound.  Musculoskeletal:     Cervical back: Normal range of motion and neck supple.     Right lower leg: No edema.     Left lower leg: No edema.  Skin:    General: Skin is warm and dry.     Findings: No rash.  Neurological:     General: No focal deficit present.     Mental Status: She is alert. Mental status is at baseline.     Motor: No weakness.  Psychiatric:        Mood and Affect: Mood normal.     ED Results / Procedures / Treatments   Labs (all labs ordered are listed, but only abnormal results are displayed) Labs Reviewed  CBC WITH DIFFERENTIAL/PLATELET - Abnormal;  Notable for the following components:      Result Value   RBC 5.29 (*)    All other components within normal limits  BASIC METABOLIC PANEL - Abnormal; Notable for the following components:   Sodium 129 (*)    Glucose, Bld 415 (*)    All other components within normal limits  CBG MONITORING, ED - Abnormal; Notable for the following components:   Glucose-Capillary 469 (*)    All other components within normal limits  CBG MONITORING, ED - Abnormal; Notable for the following components:   Glucose-Capillary 423 (*)    All other components within normal limits  CBG MONITORING, ED - Abnormal; Notable for the following components:   Glucose-Capillary 258 (*)    All other components within normal limits    EKG None  Radiology No results found.  Procedures Procedures    Medications Ordered in ED Medications  lactated ringers bolus 1,000 mL (0 mLs Intravenous Stopped 10/22/22 2251)  insulin aspart (novoLOG) injection 5 Units (5 Units Subcutaneous Given 10/22/22 2021)    ED Course/ Medical Decision Making/ A&P    Patient seen and examined. History obtained directly from patient.  Also reviewed behavioral health notes.  Reviewed labs from earlier today, however some are incomplete including chemistry panel.  Labs/EKG: Ordered CBC, BMP  Imaging: None ordered  Medications/Fluids: Ordered: SQ insulin, 5 units, IV fluid bolus.   Most recent vital signs reviewed and are as follows: BP (!) 140/75   Pulse 99   Temp 97.7 F (36.5 C) (Oral)   Resp 20   Ht 5\' 6"  (1.676 m)   Wt 126.6 kg   LMP 07/24/2018 (Exact Date) Comment: surgery 07/31/2018  SpO2 99%   BMI 45.03 kg/m   Initial impression: Hyperglycemia without signs of DKA  11:58 PM Reassessment performed. Patient appears stable.    Labs personally reviewed and interpreted including: Blood sugars improving into the 100s; CBC unremarkable; BMP with glucose of 415, sodium of 129 correcting to 134.  Reviewed pertinent lab work  and imaging with patient at bedside. Questions answered.   Most current vital signs reviewed and are as follows: BP 129/78   Pulse 84   Temp 97.6 F (36.4 C) (Oral)   Resp 19   Ht 5\' 6"  (1.676 m)   Wt 126.6 kg   LMP 07/24/2018 (Exact Date) Comment: surgery 07/31/2018  SpO2 100%   BMI 45.03 kg/m   Plan: Patient is medically cleared.  [facility] requesting repeat BMP to check sodium.                               Medical Decision Making Amount  and/or Complexity of Data Reviewed Labs: ordered.  Risk Prescription drug management.   Patient with hyperglycemia, no evidence of DKA.  Improved with fluids and additional insulin.  Patient with hyponatremia due to elevated blood sugar, correcting to near normal.  For SI, accepted to [facility] pending sodium recheck.           Final Clinical Impression(s) / ED Diagnoses Final diagnoses:  Hyperglycemia  Suicidal ideation    Rx / DC Orders ED Discharge Orders     None         Carlisle Cater, PA-C 10/23/22 0001    Audley Hose, MD 10/23/22 [phone]

## 2022-10-22 NOTE — Progress Notes (Signed)
   10/22/22 1130  [place] (Walk-ins at [facility] only)  How Did You Hear About [place]? Self  What Is the Reason for Your Visit/Call Today? ROUTINE: Joan [name] is a 42 y/o female that presents to the [facility]. She has a mental diagnosis of depression, anxiety, ADHD.  She has a complaint of crying spells for the past 3 days. Denies stressors and/or triggers. She denies SI, HI, and AVH's. She has a history of self injurious behaviors (cutting) which started 6-7 years ago. Most recent occurrence of cutting herself was 3 weeks ago. Patient uses razors to cut herself.  She intermittently has since had thoughts to self mutilate but denies that she has any intent to commit suicide/end her life. No access to firearms. Denies HI. However, does intermittently experience auditory hallucinations that "Tell me to kill myself" and "Nobody would care if I was here or not". No hx of visual hallucinations. No alcohol/drug use. No therapist/psychiatrist. She was previously prescribed Zoloft (Generic). However, stopped taking the medication 6 months ago because she felt better. She also mentions taking Adderall in the past. Lives in household with spouse.  How Long Has This Been Causing You Problems? <Week  Have You Recently Had Any Thoughts About Hurting Yourself? No  Are You Planning to Commit Suicide/Harm Yourself At This time? No  Have you Recently Had Thoughts About [place]? No  Are You Planning To Harm Someone At This Time? No  Are you currently experiencing any auditory, visual or other hallucinations? No  Have You Used Any Alcohol or Drugs in the Past 24 Hours? No  Do you have any current medical co-morbidities that require immediate attention? No  Clinician description of patient physical appearance/behavior: Patient is tearful. Depressed Mood. Calm/Cooperative.  What Do You Feel Would Help You the Most Today? Treatment for Depression or other mood problem;Medication(s)  If access to [facility] was not available, would you have sought care in the Emergency Department? No  Determination of Need Routine (7 days)  Options For Referral Medication Management;Outpatient Therapy;Partial Hospitalization;Intensive Outpatient Therapy

## 2022-10-22 NOTE — Telephone Encounter (Signed)
   Patient has been made aware of the approval.

## 2022-10-22 NOTE — ED Notes (Signed)
Per Manus Rudd RN at [facility], repeat sodium level needed prior to pt coming to [facility]. PA Vonna Kotyk made aware, repeat BMP ordered at this time.

## 2022-10-22 NOTE — ED Notes (Addendum)
Pt admitted to Continous assessment unit while awaiting inpatient bed. Pt is very pleasant, cooperative and reports hx of depression with current auditory hallucinations that she reports are command telling her to end her life. 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 Pt does not appear to be responding to internal stimuli. Oriented to unit. Skin assessment unremarkable . Pt given juice, water. Pt is safe, will con't to monitor.aware of pending bed at [facility]. Vol consent faxed .

## 2022-10-22 NOTE — ED Provider Notes (Signed)
[facility] Continuous Assessment Admission H&P  Date: 10/22/22 Patient Name: Joan [name] MRN: [id number] Chief Complaint: "I've been crying for 3 days, I need help"  Diagnoses:  Final diagnoses:  MDD (major depressive disorder), recurrent, severe, with psychosis ([place])    HPI: Joan [name] is a 42 y.o. female patient with a past psychiatric history of MDD, PTSD, and GAD who presented voluntarily and unaccompanied to [facility] for a walk-in assessment with complaints of crying spells, suicidal thoughts, auditory hallucinations, and self-harming behaviors.  Patient was seen face-to-face by this provider and chart reviewed on 10/22/22. On evaluation, Joan [name] is seated in assessment area in no acute distress. Patient is alert and oriented x4, tearful, cooperative, and pleasant during assessment. Speech is clear and coherent, normal rate and volume. Patient appears well-groomed. Eye contact is good. Mood is depressed and anxious with congruent affect. Thought process is coherent. Patient reports experiencing suicidal thoughts last night and this morning with a plan to "run my car off the road and hit something." Patient also states that she hasn't been taking her insulin recently because "I want to die." Patient reports crying spells for 3 days with no apparent stressors/triggers. Patient denies a history of suicide attempts. Patient reports a 6-7 year history of self-harm by cutting "for relief, not to kill myself." Patient reports cutting her right wrist "a few weeks ago." Scarring to right wrist noted during assessment. Patient reports 3 past inpatient psychiatric hospitalizations for similar symptoms, 2 at [facility] and the most recent in [facility] (2023). Patient does not currently have outpatient psychiatric services for therapy or medication management in place. Patient unable to contract for safety at this time. Patient denies homicidal ideations. Patient reports auditory hallucinations  that began 3-4 weeks ago and "tell me to kill myself because no one would care if I'm here or not." Patient denies visual hallucinations. Patient denies symptoms of paranoia. Patient is able to converse coherently with goal-directed thoughts and no distractibility or preoccupation. Objectively, there is no evidence of mania, delusional thinking, or indication that patient is currently responding to internal stimuli.    Patient endorses fair appetite and poor sleep (2-4 hours each night). 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 Patient states that she is currently enrolled in school at [employer] for an Associate's in [employer] degree. Patient reports that she has taken Zoloft "on and off" for the past 6-7 years and Adderall "for a few months," currently prescribed by her PCP Gae Gallop in [place]. Patient states that she stopped taking these medications 6 months ago "because I felt better." Patient also reports that she is supposed to be taking medication for her blood pressure, cholesterol, and insulin 70/30, but hasn't been taking them recently in an attempt to harm herself. Patient denies use of alcohol or illicit substances. Patient denies access to weapons.   Patient offered support and encouragement. Discussed recommendations for inpatient psychiatric hospitalization and admission to the continuous observation unit while awaiting an inpatient bed. Patient is in agreement with plan of care.  Total Time spent with patient: 30 minutes  Musculoskeletal  Strength & Muscle Tone: within normal limits Gait & Station: normal Patient leans: N/A  Psychiatric Specialty Exam  Presentation General Appearance:  Appropriate for Environment; Casual; Well Groomed  Eye Contact: Good  Speech: Clear and Coherent; Normal Rate  Speech Volume: Normal  Handedness: Right   Mood and  Affect  Mood: Depressed;  Anxious  Affect: Tearful; Depressed; Congruent   Thought Process  Thought Processes: Coherent; Goal Directed  Descriptions of Associations:Intact  Orientation:Full (Time, Place and Person)  Thought Content:Logical    Hallucinations:Hallucinations: Auditory Description of Auditory Hallucinations: "Telling me to kill myself and no one would care if I'm here or not"  Ideas of Reference:None  Suicidal Thoughts:Suicidal Thoughts: Yes, Active SI Active Intent and/or Plan: With Plan; With Means to Carry Out  Homicidal Thoughts:Homicidal Thoughts: No   Sensorium  Memory: Immediate Good; Recent Good; Remote Good  Judgment: Fair  Insight: Fair   Community education officer  Concentration: Good  Attention Span: Good  Recall: Good  Fund of Knowledge: Good  Language: Good   Psychomotor Activity  Psychomotor Activity: Psychomotor Activity: Normal   Assets  Assets: Communication Skills; Desire for Improvement; Financial Resources/Insurance; Housing; Resilience; Social Support; Transportation   Sleep  Sleep: Sleep: Poor Number of Hours of Sleep: 2   Nutritional Assessment (For OBS and FBC admissions only) Has the patient had a weight loss or gain of 10 pounds or more in the last 3 months?: No Has the patient had a decrease in food intake/or appetite?: No Does the patient have dental problems?: No Does the patient have eating habits or behaviors that may be indicators of an eating disorder including binging or inducing vomiting?: No Has the patient recently lost weight without trying?: 0 Has the patient been eating poorly because of a decreased appetite?: 0 Malnutrition Screening Tool Score: 0    Physical Exam Vitals and nursing note reviewed.  Constitutional:      General: She is not in acute distress.    Appearance: Normal appearance. She is not ill-appearing.  HENT:     Head: Normocephalic and atraumatic.     Nose: Nose normal.  Eyes:     General:         Right eye: No discharge.        Left eye: No discharge.     Conjunctiva/sclera: Conjunctivae normal.  Cardiovascular:     Rate and Rhythm: Tachycardia present.  Pulmonary:     Effort: Pulmonary effort is normal. No respiratory distress.  Musculoskeletal:        General: Normal range of motion.     Cervical back: Normal range of motion.  Skin:    General: Skin is warm and dry.  Neurological:     General: No focal deficit present.     Mental Status: She is alert and oriented to person, place, and time. Mental status is at baseline.  Psychiatric:        Attention and Perception: Attention normal. She perceives auditory hallucinations.        Mood and Affect: Mood is anxious and depressed. Affect is tearful.        Speech: Speech normal.        Behavior: Behavior normal. Behavior is cooperative.        Thought Content: Thought content includes suicidal ideation. Thought content includes suicidal plan.        Cognition and Memory: Cognition and memory normal.        Judgment: Judgment normal.    Review of Systems  Constitutional: Negative.   HENT: Negative.    Eyes: Negative.   Respiratory: Negative.    Cardiovascular: Negative.   Gastrointestinal: Negative.   Genitourinary: Negative.   Musculoskeletal: Negative.   Skin: Negative.   Neurological: Negative.   Endo/Heme/Allergies: Negative.   Psychiatric/Behavioral:  Positive for depression,  hallucinations and suicidal ideas. The patient is nervous/anxious and has insomnia.     Blood pressure (!) 151/97, pulse (!) 115, temperature 97.9 F (36.6 C), temperature source Oral, resp. rate 20, last menstrual period 07/24/2018, SpO2 98 %. There is no height or weight on file to calculate BMI.  Past Psychiatric History: MDD, PTSD, and GAD  Is the patient at risk to self? Yes  Has the patient been a risk to self in the past 6 months? Yes .    Has the patient been a risk to self within the distant past? Yes   Is the patient a  risk to others? No   Has the patient been a risk to others in the past 6 months? No   Has the patient been a risk to others within the distant past? No   Past Medical History: DM type II, asthma, BV, essential HTN, GERD, hyperlipidemia, tachycardia, kidney stone, miscarriage, migraines, recurrent URI and sinusitis  Family History: None reported  Social History:  Social History   Tobacco Use   Smoking status: Never   Smokeless tobacco: Never  Vaping Use   Vaping Use: Never used  Substance Use Topics   Alcohol use: No   Drug use: No   Last Labs:  Admission on 10/22/2022  Component Date Value Ref Range Status   Color, Urine 10/22/2022 YELLOW  YELLOW Final   APPearance 10/22/2022 CLEAR  CLEAR Final   Specific Gravity, Urine 10/22/2022 1.039 (H)  1.005 - 1.030 Final   pH 10/22/2022 5.0  5.0 - 8.0 Final   Glucose, UA 10/22/2022 >=500 (A)  NEGATIVE mg/dL Final   Hgb urine dipstick 10/22/2022 NEGATIVE  NEGATIVE Final   Bilirubin Urine 10/22/2022 NEGATIVE  NEGATIVE Final   Ketones, ur 10/22/2022 20 (A)  NEGATIVE mg/dL Final   Protein, ur 10/22/2022 NEGATIVE  NEGATIVE mg/dL Final   Nitrite 10/22/2022 NEGATIVE  NEGATIVE Final   Leukocytes,Ua 10/22/2022 NEGATIVE  NEGATIVE Final   RBC / HPF 10/22/2022 0-5  0 - 5 RBC/hpf Final   WBC, UA 10/22/2022 0-5  0 - 5 WBC/hpf Final   Bacteria, UA 10/22/2022 RARE (A)  NONE SEEN Final   Squamous Epithelial / HPF 10/22/2022 0-5  0 - 5 /HPF Final   Mucus 10/22/2022 PRESENT   Final   Budding Yeast 10/22/2022 PRESENT   Final   Performed at [facility], [address] [place], [place] [address]   POC Amphetamine UR 10/22/2022 None Detected  NONE DETECTED (Cut Off Level 1000 ng/mL) Final   POC Secobarbital (BAR) 10/22/2022 None Detected  NONE DETECTED (Cut Off Level 300 ng/mL) Final   POC Buprenorphine (BUP) 10/22/2022 None Detected  NONE DETECTED (Cut Off Level 10 ng/mL) Final   POC Oxazepam (BZO) 10/22/2022 None Detected  NONE DETECTED (Cut Off  Level 300 ng/mL) Final   POC Cocaine UR 10/22/2022 None Detected  NONE DETECTED (Cut Off Level 300 ng/mL) Final   POC Methamphetamine UR 10/22/2022 None Detected  NONE DETECTED (Cut Off Level 1000 ng/mL) Final   POC Morphine 10/22/2022 None Detected  NONE DETECTED (Cut Off Level 300 ng/mL) Final   POC Methadone UR 10/22/2022 None Detected  NONE DETECTED (Cut Off Level 300 ng/mL) Final   POC Oxycodone UR 10/22/2022 None Detected  NONE DETECTED (Cut Off Level 100 ng/mL) Final   POC Marijuana UR 10/22/2022 None Detected  NONE DETECTED (Cut Off Level 50 ng/mL) Final   SARSCOV2ONAVIRUS 2 AG 10/22/2022 NEGATIVE  NEGATIVE Final   Comment: (  NOTE) SARS-CoV-2 antigen NOT DETECTED.   Negative results are presumptive.  Negative results do not preclude SARS-CoV-2 infection and should not be used as the sole basis for treatment or other patient management decisions, including infection  control decisions, particularly in the presence of clinical signs and  symptoms consistent with COVID-19, or in those who have been in contact with the virus.  Negative results must be combined with clinical observations, patient history, and epidemiological information. The expected result is Negative.  Fact Sheet for Patients: HandmadeRecipes.com.cy  Fact Sheet for Healthcare Providers: FuneralLife.at  This test is not yet approved or cleared by the [place] FDA and  has been authorized for detection and/or diagnosis of SARS-CoV-2 by FDA under an Emergency Use Authorization (EUA).  This EUA will remain in effect (meaning this test can be used) for the duration of  the COV                          ID-19 declaration under Section 564(b)(1) of the Act, 21 U.S.C. section 360bbb-3(b)(1), unless the authorization is terminated or revoked sooner.     Glucose-Capillary 10/22/2022 456 (H)  70 - 99 mg/dL Final   Glucose reference range applies only to samples taken  after fasting for at least 8 hours.  Admission on 10/03/2022, Discharged on 10/03/2022  Component Date Value Ref Range Status   Color, UA 10/03/2022 yellow  yellow Final   Clarity, UA 10/03/2022 clear  clear Final   Glucose, UA 10/03/2022 =500 (A)  negative mg/dL Final   Bilirubin, UA 10/03/2022 negative  negative Final   Ketones, POC UA 10/03/2022 negative  negative mg/dL Final   Spec Grav, UA 10/03/2022 1.015  1.010 - 1.025 Final   Blood, UA 10/03/2022 negative  negative Final   pH, UA 10/03/2022 7.0  5.0 - 8.0 Final   Protein Ur, POC 10/03/2022 negative  negative mg/dL Final   Urobilinogen, UA 10/03/2022 0.2  0.2 or 1.0 E.U./dL Final   Nitrite, UA 10/03/2022 Negative  Negative Final   Leukocytes, UA 10/03/2022 Negative  Negative Final   Influenza A, POC 10/03/2022 Negative  Negative Final   Influenza B, POC 10/03/2022 Negative  Negative Final   POCT Glucose ([facility]) 10/03/2022 456 (A)  70 - 99 mg/dL Final  Office Visit on 09/26/2022  Component Date Value Ref Range Status   Cholesterol 09/26/2022 270 (H)  0 - 200 mg/dL Final   ATP III Classification       Desirable:  < 200 mg/dL               Borderline High:  200 - 239 mg/dL          High:  > = 240 mg/dL   Triglycerides 09/26/2022 609.0 Triglyceride is over 400; calculations on Lipids are invalid. (H)  0.0 - 149.0 mg/dL Final   Normal:  <150 mg/dLBorderline High:  150 - 199 mg/dL   HDL 09/26/2022 55.50  >39.00 mg/dL Final   Total CHOL/HDL Ratio 09/26/2022 5   Final                  Men          Women1/2 Average Risk     3.4          3.3Average Risk          5.0          4.42X Average Risk  9.6          7.13X Average Risk          15.0          11.0                       Hgb A1c MFr Bld 09/26/2022 13.7 (H)  4.6 - 6.5 % Final   Glycemic Control Guidelines for People with Diabetes:Non Diabetic:  <6%Goal of Therapy: <7%Additional Action Suggested:  >8%    Sodium 09/26/2022 131 (L)  135 - 145 mEq/L Final   Potassium 09/26/2022 4.7   3.5 - 5.1 mEq/L Final   Chloride 09/26/2022 96  96 - 112 mEq/L Final   CO2 09/26/2022 27  19 - 32 mEq/L Final   Glucose, Bld 09/26/2022 334 (H)  70 - 99 mg/dL Final   BUN 09/26/2022 19  6 - 23 mg/dL Final   Creatinine, Ser 09/26/2022 0.75  0.40 - 1.20 mg/dL Final   GFR 09/26/2022 97.75  >60.00 mL/min Final   Calculated using the CKD-EPI Creatinine Equation (2021)   Calcium 09/26/2022 9.6  8.4 - 10.5 mg/dL Final   Direct LDL 09/26/2022 132.0  mg/dL Final   Optimal:  <100 mg/dLNear or Above Optimal:  100-129 mg/dLBorderline High:  130-159 mg/dLHigh:  160-189 mg/dLVery High:  >190 mg/dL  Admission on 08/26/2022, Discharged on 08/26/2022  Component Date Value Ref Range Status   SARS Coronavirus 2 by RT PCR 08/26/2022 NEGATIVE  NEGATIVE Final   Comment: (NOTE) SARS-CoV-2 target nucleic acids are NOT DETECTED.  The SARS-CoV-2 RNA is generally detectable in upper respiratory specimens during the acute phase of infection. The lowest concentration of SARS-CoV-2 viral copies this assay can detect is 138 copies/mL. A negative result does not preclude SARS-Cov-2 infection and should not be used as the sole basis for treatment or other patient management decisions. A negative result may occur with  improper specimen collection/handling, submission of specimen other than nasopharyngeal swab, presence of viral mutation(s) within the areas targeted by this assay, and inadequate number of viral copies(<138 copies/mL). A negative result must be combined with clinical observations, patient history, and epidemiological information. The expected result is Negative.  Fact Sheet for Patients:  EntrepreneurPulse.com.au  Fact Sheet for Healthcare Providers:  IncredibleEmployment.be  This test is no                          t yet approved or cleared by the [place] FDA and  has been authorized for detection and/or diagnosis of SARS-CoV-2 by FDA under an Emergency  Use Authorization (EUA). This EUA will remain  in effect (meaning this test can be used) for the duration of the COVID-19 declaration under Section 564(b)(1) of the Act, 21 U.S.C.section 360bbb-3(b)(1), unless the authorization is terminated  or revoked sooner.       Influenza A by PCR 08/26/2022 NEGATIVE  NEGATIVE Final   Influenza B by PCR 08/26/2022 NEGATIVE  NEGATIVE Final   Comment: (NOTE) The Xpert Xpress SARS-CoV-2/FLU/RSV plus assay is intended as an aid in the diagnosis of influenza from Nasopharyngeal swab specimens and should not be used as a sole basis for treatment. Nasal washings and aspirates are unacceptable for Xpert Xpress SARS-CoV-2/FLU/RSV testing.  Fact Sheet for Patients: EntrepreneurPulse.com.au  Fact Sheet for Healthcare Providers: IncredibleEmployment.be  This test is not yet approved or cleared by the [place] FDA and has been authorized for detection and/or diagnosis of SARS-CoV-2  by FDA under an Emergency Use Authorization (EUA). This EUA will remain in effect (meaning this test can be used) for the duration of the COVID-19 declaration under Section 564(b)(1) of the Act, 21 U.S.C. section 360bbb-3(b)(1), unless the authorization is terminated or revoked.     Resp Syncytial Virus by PCR 08/26/2022 NEGATIVE  NEGATIVE Final   Comment: (NOTE) Fact Sheet for Patients: EntrepreneurPulse.com.au  Fact Sheet for Healthcare Providers: IncredibleEmployment.be  This test is not yet approved or cleared by the [place] FDA and has been authorized for detection and/or diagnosis of SARS-CoV-2 by FDA under an Emergency Use Authorization (EUA). This EUA will remain in effect (meaning this test can be used) for the duration of the COVID-19 declaration under Section 564(b)(1) of the Act, 21 U.S.C. section 360bbb-3(b)(1), unless the authorization is terminated or revoked.  Performed  at [facility], [address] [place], [place] [address]    Group A Strep by PCR 08/26/2022 NOT DETECTED  NOT DETECTED Final   Performed at [facility], [address], [place], [place] [address]  Admission on 07/19/2022, Discharged on 07/19/2022  Component Date Value Ref Range Status   Group A Strep by PCR 07/19/2022 NOT DETECTED  NOT DETECTED Final   Performed at [facility], [address] [place], [place] [address]   SARS Coronavirus 2 by RT PCR 07/19/2022 NEGATIVE  NEGATIVE Final   Comment: (NOTE) SARS-CoV-2 target nucleic acids are NOT DETECTED.  The SARS-CoV-2 RNA is generally detectable in upper respiratory specimens during the acute phase of infection. The lowest concentration of SARS-CoV-2 viral copies this assay can detect is 138 copies/mL. A negative result does not preclude SARS-Cov-2 infection and should not be used as the sole basis for treatment or other patient management decisions. A negative result may occur with  improper specimen collection/handling, submission of specimen other than nasopharyngeal swab, presence of viral mutation(s) within the areas targeted by this assay, and inadequate number of viral copies(<138 copies/mL). A negative result must be combined with clinical observations, patient history, and epidemiological information. The expected result is Negative.  Fact Sheet for Patients:  EntrepreneurPulse.com.au  Fact Sheet for Healthcare Providers:  IncredibleEmployment.be  This test is no                          t yet approved or cleared by the [place] FDA and  has been authorized for detection and/or diagnosis of SARS-CoV-2 by FDA under an Emergency Use Authorization (EUA). This EUA will remain  in effect (meaning this test can be used) for the duration of the COVID-19 declaration under Section 564(b)(1) of the Act, 21 U.S.C.section 360bbb-3(b)(1), unless the  authorization is terminated  or revoked sooner.       Influenza A by PCR 07/19/2022 NEGATIVE  NEGATIVE Final   Influenza B by PCR 07/19/2022 NEGATIVE  NEGATIVE Final   Comment: (NOTE) The Xpert Xpress SARS-CoV-2/FLU/RSV plus assay is intended as an aid in the diagnosis of influenza from Nasopharyngeal swab specimens and should not be used as a sole basis for treatment. Nasal washings and aspirates are unacceptable for Xpert Xpress SARS-CoV-2/FLU/RSV testing.  Fact Sheet for Patients: EntrepreneurPulse.com.au  Fact Sheet for Healthcare Providers: IncredibleEmployment.be  This test is not yet approved or cleared by the [place] FDA and has been authorized for detection and/or diagnosis of SARS-CoV-2 by FDA under an Emergency Use Authorization (EUA). This EUA will remain  in effect (meaning this test can be used) for the duration of the COVID-19 declaration under Section 564(b)(1) of the Act, 21 U.S.C. section 360bbb-3(b)(1), unless the authorization is terminated or revoked.     Resp Syncytial Virus by PCR 07/19/2022 NEGATIVE  NEGATIVE Final   Comment: (NOTE) Fact Sheet for Patients: EntrepreneurPulse.com.au  Fact Sheet for Healthcare Providers: IncredibleEmployment.be  This test is not yet approved or cleared by the [place] FDA and has been authorized for detection and/or diagnosis of SARS-CoV-2 by FDA under an Emergency Use Authorization (EUA). This EUA will remain in effect (meaning this test can be used) for the duration of the COVID-19 declaration under Section 564(b)(1) of the Act, 21 U.S.C. section 360bbb-3(b)(1), unless the authorization is terminated or revoked.  Performed at [facility], [address] [place], [place] [address]     Allergies: Ibuprofen, Ciprofloxacin, Metformin and related, Penicillins, Sulfa antibiotics, Other, Shellfish allergy, and Trazodone and  nefazodone  Medications:  Facility Ordered Medications  Medication   acetaminophen (TYLENOL) tablet 650 mg   alum & mag hydroxide-simeth (MAALOX/MYLANTA) 200-200-20 MG/5ML suspension 30 mL   magnesium hydroxide (MILK OF MAGNESIA) suspension 30 mL   atorvastatin (LIPITOR) tablet 20 mg   irbesartan (AVAPRO) tablet 75 mg   [START ON 10/23/2022] insulin aspart (novoLOG) injection 0-15 Units   insulin aspart (novoLOG) injection 0-5 Units   [COMPLETED] insulin aspart (novoLOG) injection 15 Units   PTA Medications  Medication Sig   insulin aspart protamine - aspart (NOVOLOG 70/30 FLEXPEN RELION) (70-30) 100 UNIT/ML FlexPen Inject 40 units into the skin every morning and 36 units every evening for diabetes. (Patient taking differently: 50 Units 2 (two) times daily before a meal.)   valsartan (DIOVAN) 80 MG tablet Take 1 tablet (80 mg total) by mouth daily. for blood pressure.   atorvastatin (LIPITOR) 20 MG tablet Take 1 tablet (20 mg total) by mouth daily. for cholesterol.   albuterol (VENTOLIN HFA) 108 (90 Base) MCG/ACT inhaler Inhale 1 puff into the lungs every 4 (four) hours as needed for wheezing or shortness of breath.   Dulaglutide (TRULICITY) [id number] [id number] SOPN Inject 0.75 mg into the skin once a week. for diabetes. (Patient not taking: Reported on 10/22/2022)    Medical Decision Making  Joan [name] was admitted to [facility] continuous assessment unit for MDD (major depressive disorder), recurrent, severe, with psychosis ([place]), crisis management, and stabilization. Awaiting inpatient psychiatric bed placement. Routine labs ordered, which include Lab Orders         Resp panel by RT-PCR (RSV, Flu A&B, Covid) Anterior Nasal Swab         CBC with Differential/Platelet         Comprehensive metabolic panel         Hemoglobin A1c         Magnesium         Ethanol         Lipid panel         Prolactin         TSH         Urinalysis, Routine w reflex  microscopic -Urine, Clean Catch         Glucose, capillary         POCT Urine Drug Screen - (I-Screen)         POC SARS Coronavirus 2 Ag    Medication Management: Medications started, will restart home medications as appropriate Meds ordered this encounter  Medications  acetaminophen (TYLENOL) tablet 650 mg   alum & mag hydroxide-simeth (MAALOX/MYLANTA) 200-200-20 MG/5ML suspension 30 mL   magnesium hydroxide (MILK OF MAGNESIA) suspension 30 mL   atorvastatin (LIPITOR) tablet 20 mg   irbesartan (AVAPRO) tablet 75 mg   insulin aspart (novoLOG) injection 0-15 Units    Order Specific Question:   Correction coverage:    Answer:   Moderate (average weight, post-op)    Order Specific Question:   CBG < 70:    Answer:   Implement Hypoglycemia Standing Orders and refer to Hypoglycemia Standing Orders sidebar report    Order Specific Question:   CBG 70 - 120:    Answer:   0 units    Order Specific Question:   CBG 121 - 150:    Answer:   2 units    Order Specific Question:   CBG 151 - 200:    Answer:   3 units    Order Specific Question:   CBG 201 - 250:    Answer:   5 units    Order Specific Question:   CBG 251 - 300:    Answer:   8 units    Order Specific Question:   CBG 301 - 350:    Answer:   11 units    Order Specific Question:   CBG 351 - 400:    Answer:   15 units    Order Specific Question:   CBG > 400    Answer:   call MD and obtain STAT lab verification   insulin aspart (novoLOG) injection 0-5 Units    Order Specific Question:   Correction coverage:    Answer:   HS scale    Order Specific Question:   CBG < 70:    Answer:   Implement Hypoglycemia Standing Orders and refer to Hypoglycemia Standing Orders sidebar report    Order Specific Question:   CBG 70 - 120:    Answer:   0 units    Order Specific Question:   CBG 121 - 150:    Answer:   0 units    Order Specific Question:   CBG 151 - 200:    Answer:   0 units    Order Specific Question:   CBG 201 - 250:    Answer:   2  units    Order Specific Question:   CBG 251 - 300:    Answer:   3 units    Order Specific Question:   CBG 301 - 350:    Answer:   4 units    Order Specific Question:   CBG 351 - 400:    Answer:   5 units    Order Specific Question:   CBG > 400    Answer:   call MD and obtain STAT lab verification   insulin aspart (novoLOG) injection 15 Units    Will maintain observation checks every 15 minutes for safety. Consult to diabetes coordinator placed. Patient has tentatively been accepted to [facility] room 307-2 tomorrow (10/23/22) after 8am pending labs, covid, voluntary consent.    Recommendations  Based on my evaluation the patient does not appear to have an emergency medical condition. Recommend inpatient psychiatric hospitalization.    Hezzie Bump, NP 10/22/22  6:05 PM

## 2022-10-22 NOTE — Progress Notes (Signed)
Pt was accepted to [facility] 10/22/22; Bed Assignment 307-2 PENDING Labs, Negative COVID, and per nursing Leonia Reader, RN has assisted and faxed signed vol consent to [phone]  Pt meets inpatient criteria per Asencion Islam, NP  Attending Physician will be Dr. Janine Limbo  Report can be called to: Adult unit: [phone]  Pt can arrive: Night [facility] will coordinate with care team.  Care Team Notified: Day [facility] Lynnda Shields, RN, Asencion Islam, NP, Leota Jacobsen, LPN, Leonia Reader, RN, [place], [place], Night Gordon, RN  [place], [place] 10/22/2022 @ 6:04 PM

## 2022-10-23 ENCOUNTER — Encounter (HOSPITAL_COMMUNITY): Payer: Self-pay

## 2022-10-23 ENCOUNTER — Encounter (HOSPITAL_COMMUNITY): Payer: Self-pay | Admitting: Behavioral Health

## 2022-10-23 ENCOUNTER — Inpatient Hospital Stay (HOSPITAL_COMMUNITY)
Admission: AD | Admit: 2022-10-23 | Discharge: 2022-10-27 | DRG: 885 | Disposition: A | Discharge status: Home / Self Care | Payer: No Typology Code available for payment source | Source: Intra-hospital | Attending: Psychiatry | Admitting: Psychiatry

## 2022-10-23 ENCOUNTER — Other Ambulatory Visit: Payer: Self-pay

## 2022-10-23 ENCOUNTER — Telehealth: Payer: Self-pay

## 2022-10-23 DIAGNOSIS — G4700 Insomnia, unspecified: Secondary | ICD-10-CM | POA: Diagnosis present

## 2022-10-23 DIAGNOSIS — Z79899 Other long term (current) drug therapy: Secondary | ICD-10-CM | POA: Diagnosis not present

## 2022-10-23 DIAGNOSIS — J45909 Unspecified asthma, uncomplicated: Secondary | ICD-10-CM | POA: Diagnosis present

## 2022-10-23 DIAGNOSIS — Z9079 Acquired absence of other genital organ(s): Secondary | ICD-10-CM

## 2022-10-23 DIAGNOSIS — Z818 Family history of other mental and behavioral disorders: Secondary | ICD-10-CM

## 2022-10-23 DIAGNOSIS — Z87442 Personal history of urinary calculi: Secondary | ICD-10-CM | POA: Diagnosis not present

## 2022-10-23 DIAGNOSIS — Z62810 Personal history of physical and sexual abuse in childhood: Secondary | ICD-10-CM

## 2022-10-23 DIAGNOSIS — F4310 Post-traumatic stress disorder, unspecified: Secondary | ICD-10-CM | POA: Diagnosis present

## 2022-10-23 DIAGNOSIS — Z634 Disappearance and death of family member: Secondary | ICD-10-CM

## 2022-10-23 DIAGNOSIS — F3181 Bipolar II disorder: Secondary | ICD-10-CM | POA: Diagnosis not present

## 2022-10-23 DIAGNOSIS — Z83438 Family history of other disorder of lipoprotein metabolism and other lipidemia: Secondary | ICD-10-CM

## 2022-10-23 DIAGNOSIS — F410 Panic disorder [episodic paroxysmal anxiety] without agoraphobia: Secondary | ICD-10-CM | POA: Diagnosis present

## 2022-10-23 DIAGNOSIS — E785 Hyperlipidemia, unspecified: Secondary | ICD-10-CM | POA: Diagnosis present

## 2022-10-23 DIAGNOSIS — Z825 Family history of asthma and other chronic lower respiratory diseases: Secondary | ICD-10-CM

## 2022-10-23 DIAGNOSIS — F333 Major depressive disorder, recurrent, severe with psychotic symptoms: Secondary | ICD-10-CM | POA: Diagnosis present

## 2022-10-23 DIAGNOSIS — K219 Gastro-esophageal reflux disease without esophagitis: Secondary | ICD-10-CM | POA: Diagnosis present

## 2022-10-23 DIAGNOSIS — F411 Generalized anxiety disorder: Secondary | ICD-10-CM | POA: Diagnosis present

## 2022-10-23 DIAGNOSIS — Z881 Allergy status to other antibiotic agents status: Secondary | ICD-10-CM

## 2022-10-23 DIAGNOSIS — Z6841 Body Mass Index (BMI) 40.0 and over, adult: Secondary | ICD-10-CM

## 2022-10-23 DIAGNOSIS — Z9049 Acquired absence of other specified parts of digestive tract: Secondary | ICD-10-CM | POA: Diagnosis not present

## 2022-10-23 DIAGNOSIS — R45851 Suicidal ideations: Secondary | ICD-10-CM | POA: Diagnosis present

## 2022-10-23 DIAGNOSIS — M549 Dorsalgia, unspecified: Secondary | ICD-10-CM | POA: Diagnosis present

## 2022-10-23 DIAGNOSIS — Z885 Allergy status to narcotic agent status: Secondary | ICD-10-CM

## 2022-10-23 DIAGNOSIS — Z882 Allergy status to sulfonamides status: Secondary | ICD-10-CM

## 2022-10-23 DIAGNOSIS — F332 Major depressive disorder, recurrent severe without psychotic features: Secondary | ICD-10-CM | POA: Diagnosis present

## 2022-10-23 DIAGNOSIS — Z833 Family history of diabetes mellitus: Secondary | ICD-10-CM

## 2022-10-23 DIAGNOSIS — E1165 Type 2 diabetes mellitus with hyperglycemia: Secondary | ICD-10-CM | POA: Diagnosis present

## 2022-10-23 DIAGNOSIS — G8929 Other chronic pain: Secondary | ICD-10-CM | POA: Diagnosis present

## 2022-10-23 DIAGNOSIS — I10 Essential (primary) hypertension: Secondary | ICD-10-CM | POA: Diagnosis present

## 2022-10-23 DIAGNOSIS — Z888 Allergy status to other drugs, medicaments and biological substances status: Secondary | ICD-10-CM

## 2022-10-23 DIAGNOSIS — Z91018 Allergy to other foods: Secondary | ICD-10-CM

## 2022-10-23 DIAGNOSIS — Z811 Family history of alcohol abuse and dependence: Secondary | ICD-10-CM

## 2022-10-23 DIAGNOSIS — R440 Auditory hallucinations: Secondary | ICD-10-CM | POA: Diagnosis present

## 2022-10-23 DIAGNOSIS — Z8616 Personal history of COVID-19: Secondary | ICD-10-CM

## 2022-10-23 DIAGNOSIS — Z880 Allergy status to penicillin: Secondary | ICD-10-CM

## 2022-10-23 DIAGNOSIS — Z8249 Family history of ischemic heart disease and other diseases of the circulatory system: Secondary | ICD-10-CM

## 2022-10-23 DIAGNOSIS — Z823 Family history of stroke: Secondary | ICD-10-CM

## 2022-10-23 DIAGNOSIS — Z91013 Allergy to seafood: Secondary | ICD-10-CM

## 2022-10-23 DIAGNOSIS — Z90710 Acquired absence of both cervix and uterus: Secondary | ICD-10-CM

## 2022-10-23 LAB — CBG MONITORING, ED: Glucose-Capillary: 175 mg/dL — ABNORMAL HIGH (ref 70–99)

## 2022-10-23 LAB — GLUCOSE, CAPILLARY
Glucose-Capillary: 224 mg/dL — ABNORMAL HIGH (ref 70–99)
Glucose-Capillary: 382 mg/dL — ABNORMAL HIGH (ref 70–99)
Glucose-Capillary: 288 mg/dL — ABNORMAL HIGH (ref 70–99)
Glucose-Capillary: 335 mg/dL — ABNORMAL HIGH (ref 70–99)

## 2022-10-23 LAB — PROLACTIN: Prolactin: 11.1 ng/mL (ref 4.8–33.4)

## 2022-10-23 LAB — BASIC METABOLIC PANEL
Anion gap: 11 (ref 5–15)
BUN: 17 mg/dL (ref 6–20)
CO2: 24 mmol/L (ref 22–32)
Calcium: 8.9 mg/dL (ref 8.9–10.3)
Chloride: 99 mmol/L (ref 98–111)
Creatinine, Ser: 0.64 mg/dL (ref 0.44–1.00)
GFR, Estimated: >60 mL/min (ref >60)
Glucose, Bld: 190 mg/dL — ABNORMAL HIGH (ref 70–99)
Potassium: 3.6 mmol/L (ref 3.5–5.1)
Sodium: 134 mmol/L — ABNORMAL LOW (ref 135–145)

## 2022-10-23 LAB — HEMOGLOBIN A1C
Hgb A1c MFr Bld: 12.6 % — ABNORMAL HIGH (ref 4.8–5.6)
Mean Plasma Glucose: 315 mg/dL

## 2022-10-23 MED ORDER — HYDROXYZINE HCL 50 MG PO TABS
50.0000 mg | ORAL_TABLET | Freq: Every day | ORAL | Status: DC
  Administered 2022-10-23 – 2022-10-26 (×4): 50 mg via ORAL
  Filled 2022-10-23 (×6): qty 1

## 2022-10-23 MED ORDER — PROPRANOLOL HCL 10 MG PO TABS
10.0000 mg | ORAL_TABLET | Freq: Two times a day (BID) | ORAL | Status: DC
  Administered 2022-10-23 – 2022-10-27 (×8): 10 mg via ORAL
  Filled 2022-10-23 (×11): qty 1

## 2022-10-23 MED ORDER — HALOPERIDOL LACTATE 5 MG/ML IJ SOLN: 5.0000 mg | Freq: Three times a day (TID) | INTRAMUSCULAR | Status: DC | PRN

## 2022-10-23 MED ORDER — LORAZEPAM 1 MG PO TABS: 2.0000 mg | ORAL_TABLET | Freq: Three times a day (TID) | ORAL | Status: DC | PRN

## 2022-10-23 MED ORDER — ALUM & MAG HYDROXIDE-SIMETH 200-200-20 MG/5ML PO SUSP: 30.0000 mL | ORAL | Status: DC | PRN

## 2022-10-23 MED ORDER — ATORVASTATIN CALCIUM 20 MG PO TABS
20.0000 mg | ORAL_TABLET | Freq: Every day | ORAL | Status: DC
  Administered 2022-10-23 – 2022-10-27 (×5): 20 mg via ORAL
  Filled 2022-10-23 (×7): qty 1

## 2022-10-23 MED ORDER — SERTRALINE HCL 50 MG PO TABS
50.0000 mg | ORAL_TABLET | Freq: Every day | ORAL | Status: DC
  Filled 2022-10-23: qty 1

## 2022-10-23 MED ORDER — HYDROXYZINE HCL 25 MG PO TABS: 25.0000 mg | ORAL_TABLET | Freq: Three times a day (TID) | ORAL | Status: DC | PRN

## 2022-10-23 MED ORDER — MAGNESIUM HYDROXIDE 400 MG/5ML PO SUSP: 30.0000 mL | Freq: Every day | ORAL | Status: DC | PRN

## 2022-10-23 MED ORDER — INSULIN ASPART 100 UNIT/ML IJ SOLN
0.0000 [IU] | Freq: Every day | INTRAMUSCULAR | Status: DC
  Administered 2022-10-23: 4 [IU] via SUBCUTANEOUS
  Administered 2022-10-24 – 2022-10-25 (×2): 5 [IU] via SUBCUTANEOUS
  Administered 2022-10-26: 4 [IU] via SUBCUTANEOUS

## 2022-10-23 MED ORDER — IRBESARTAN 75 MG PO TABS
75.0000 mg | ORAL_TABLET | Freq: Every day | ORAL | Status: DC
  Administered 2022-10-23 – 2022-10-27 (×5): 75 mg via ORAL
  Filled 2022-10-23 (×7): qty 1

## 2022-10-23 MED ORDER — ENSURE ENLIVE PO LIQD
237.0000 mL | Freq: Every day | ORAL | Status: DC
  Filled 2022-10-23 (×5): qty 237

## 2022-10-23 MED ORDER — DIPHENHYDRAMINE HCL 25 MG PO CAPS: 50.0000 mg | ORAL_CAPSULE | Freq: Three times a day (TID) | ORAL | Status: DC | PRN

## 2022-10-23 MED ORDER — LORAZEPAM 2 MG/ML IJ SOLN: 2.0000 mg | Freq: Three times a day (TID) | INTRAMUSCULAR | Status: DC | PRN

## 2022-10-23 MED ORDER — ALBUTEROL SULFATE HFA 108 (90 BASE) MCG/ACT IN AERS: 2.0000 | INHALATION_SPRAY | Freq: Four times a day (QID) | RESPIRATORY_TRACT | Status: DC | PRN

## 2022-10-23 MED ORDER — INSULIN ASPART 100 UNIT/ML IJ SOLN
0.0000 [IU] | Freq: Three times a day (TID) | INTRAMUSCULAR | Status: DC
  Administered 2022-10-23: 5 [IU] via SUBCUTANEOUS
  Administered 2022-10-23: 15 [IU] via SUBCUTANEOUS
  Administered 2022-10-23 – 2022-10-24 (×2): 8 [IU] via SUBCUTANEOUS
  Administered 2022-10-24: 3 [IU] via SUBCUTANEOUS
  Administered 2022-10-24 – 2022-10-25 (×2): 8 [IU] via SUBCUTANEOUS
  Administered 2022-10-25: 11 [IU] via SUBCUTANEOUS
  Administered 2022-10-25: 5 [IU] via SUBCUTANEOUS
  Administered 2022-10-26: 15 [IU] via SUBCUTANEOUS
  Administered 2022-10-26 – 2022-10-27 (×3): 5 [IU] via SUBCUTANEOUS

## 2022-10-23 MED ORDER — ACETAMINOPHEN 325 MG PO TABS
650.0000 mg | ORAL_TABLET | Freq: Four times a day (QID) | ORAL | Status: DC | PRN
  Administered 2022-10-23 – 2022-10-27 (×4): 650 mg via ORAL
  Filled 2022-10-23 (×4): qty 2

## 2022-10-23 MED ORDER — INSULIN ASPART PROT & ASPART (70-30 MIX) 100 UNIT/ML ~~LOC~~ SUSP
15.0000 [IU] | Freq: Two times a day (BID) | SUBCUTANEOUS | Status: DC
  Administered 2022-10-23 – 2022-10-27 (×8): 15 [IU] via SUBCUTANEOUS

## 2022-10-23 MED ORDER — HALOPERIDOL 5 MG PO TABS: 5.0000 mg | ORAL_TABLET | Freq: Three times a day (TID) | ORAL | Status: DC | PRN

## 2022-10-23 MED ORDER — DIPHENHYDRAMINE HCL 50 MG/ML IJ SOLN: 50.0000 mg | Freq: Three times a day (TID) | INTRAMUSCULAR | Status: DC | PRN

## 2022-10-23 MED ORDER — ARIPIPRAZOLE 5 MG PO TABS
5.0000 mg | ORAL_TABLET | Freq: Every day | ORAL | Status: DC
  Administered 2022-10-24 – 2022-10-27 (×4): 5 mg via ORAL
  Filled 2022-10-23 (×5): qty 1

## 2022-10-23 MED ORDER — ENSURE MAX PROTEIN PO LIQD
11.0000 [oz_av] | Freq: Every day | ORAL | Status: DC
  Filled 2022-10-23 (×2): qty 330

## 2022-10-23 NOTE — Tx Team (Signed)
Initial Treatment Plan 10/23/2022 3:14 AM Joan [name] [id number]    PATIENT STRESSORS: Health problems   Medication change or noncompliance     PATIENT STRENGTHS: Average or above average intelligence  Financial means  General fund of knowledge  Motivation for treatment/growth  Supportive family/friends    PATIENT IDENTIFIED PROBLEMS:   depression  anxiety  Auditory hallucination  Medication noncompliant    "Learn coping skill"  "Need help with my depression"       DISCHARGE CRITERIA:  Improved stabilization in mood, thinking, and/or behavior Medical problems require only outpatient monitoring Need for constant or close observation no longer present Verbal commitment to aftercare and medication compliance  PRELIMINARY DISCHARGE PLAN: Attend aftercare/continuing care group Outpatient therapy Return to previous living arrangement Return to previous work or school arrangements  PATIENT/FAMILY INVOLVEMENT: This treatment plan has been presented to and reviewed with the patient, Joan [name],  The patient and family have been given the opportunity to ask questions and make suggestions.  JEHU-APPIAH, Megan Salon, RN 10/23/2022, 3:14 AM

## 2022-10-23 NOTE — ED Notes (Signed)
Confirmed with driver. Pt to be transported to [facility].

## 2022-10-23 NOTE — Inpatient Diabetes Management (Signed)
Inpatient Diabetes Program Recommendations  AACE/ADA: New Consensus Statement on Inpatient Glycemic Control (2015)  Target Ranges:  Prepandial:   less than 140 mg/dL      Peak postprandial:   less than 180 mg/dL (1-2 hours)      Critically ill patients:  140 - 180 mg/dL   Lab Results  Component Value Date   GLUCAP 382 (H) 10/23/2022   HGBA1C 13.7 (H) 09/26/2022    Review of Glycemic Control  Diabetes history: DM 2 Outpatient Diabetes medications: 70/30 50 units bid Current orders for Inpatient glycemic control:  Novolog 0-15 units tid + hs  Ensure Enlive (40 grams of carbohydrates in addition to meal intake) Daily after breakfast  Inpatient Diabetes Program Recommendations:    -  Start Novolog 70/30 15 units bid  Thanks,  Tama Headings RN, MSN, BC-ADM Inpatient Diabetes Coordinator Team Pager [phone] (8a-5p)

## 2022-10-23 NOTE — [facility] Suicide Risk Assessment (Signed)
Suicide Risk Assessment  Admission Assessment    [facility] Admission Suicide Risk Assessment   Nursing information obtained from:  Patient, Review of record Demographic factors:  Caucasian Current Mental Status:  Self-harm thoughts, Self-harm behaviors Loss Factors:  Decline in physical health Historical Factors:  NA Risk Reduction Factors:  Employed, Positive social support  Total Time spent with patient: 1.5 hours Principal Problem: Bipolar 2 disorder, major depressive episode ([place]) Diagnosis:  Principal Problem:   Bipolar 2 disorder, major depressive episode ([place]) Active Problems:   PTSD (post-traumatic stress disorder)   Uncontrolled type 2 diabetes mellitus with hyperglycemia ([place])   Essential hypertension   Hyperlipidemia   Asthma   GAD (generalized anxiety disorder)  Subjective Data: :"I stopped taking my Zoloft, depression hit me really hard. I stopped taking my insulin because I wanted to die."   Continued Clinical Symptoms: depressed mood, anhedonia, insomnia, fatigue, hopelessness, recurrent thoughts of death, suicidal thoughts with specific plan, anxiety, panic attacks, loss of energy/fatigue, disturbed sleep, Patient currently in need of continuous hospitalization for treatment and stabilization of current symptoms. Alcohol Use Disorder Identification Test Final Score (AUDIT): 0 The "Alcohol Use Disorders Identification Test", Guidelines for Use in Primary Care, Second Edition.  World Pharmacologist [facility]). Score between 0-7:  no or low risk or alcohol related problems. Score between 8-15:  moderate risk of alcohol related problems. Score between 16-19:  high risk of alcohol related problems. Score 20 or above:  warrants further diagnostic evaluation for alcohol dependence and treatment.  CLINICAL FACTORS:   Bipolar Disorder:   Depressive phase Depression:   Anhedonia Hopelessness Insomnia Severe  Musculoskeletal: Strength & Muscle Tone: within normal  limits Gait & Station: normal Patient leans: N/A  Psychiatric Specialty Exam:  Presentation  General Appearance:  Appropriate for Environment; Fairly Groomed  Eye Contact: Good  Speech: Clear and Coherent  Speech Volume: Normal  Handedness: Right   Mood and Affect  Mood: Depressed; Anxious  Affect: Congruent   Thought Process  Thought Processes: Coherent  Descriptions of Associations:Intact  Orientation:Full (Time, Place and Person)  Thought Content:Logical  History of Schizophrenia/Schizoaffective disorder:No data recorded Duration of Psychotic Symptoms:No data recorded Hallucinations:Hallucinations: None Description of Auditory Hallucinations: "Telling me to kill myself and no one would care if I'm here or not"  Ideas of Reference:None  Suicidal Thoughts:Suicidal Thoughts: No SI Active Intent and/or Plan: With Plan; With Means to Carry Out  Homicidal Thoughts:Homicidal Thoughts: No   Sensorium  Memory: Immediate Good  Judgment: Fair  Insight: Good   Executive Functions  Concentration: Good  Attention Span: Good  Recall: Good  Fund of Knowledge: Good  Language: Good   Psychomotor Activity  Psychomotor Activity: Psychomotor Activity: Normal   Assets  Assets: Communication Skills   Sleep  Sleep: Sleep: Poor Number of Hours of Sleep: 2    Physical Exam: Physical Exam Constitutional:      Appearance: Normal appearance.  HENT:     Head: Normocephalic.     Nose: Nose normal.  Eyes:     Pupils: Pupils are equal, round, and reactive to light.  Musculoskeletal:     Cervical back: Normal range of motion.  Neurological:     Mental Status: She is alert and oriented to person, place, and time.    Review of Systems  Constitutional: Negative.   HENT: Negative.    Eyes: Negative.   Respiratory: Negative.    Cardiovascular: Negative.   Gastrointestinal: Negative.   Genitourinary: Negative.   Musculoskeletal:  Negative.  Skin: Negative.   Neurological: Negative.   Psychiatric/Behavioral:  Positive for depression and hallucinations. Negative for memory loss, substance abuse and suicidal ideas. The patient is nervous/anxious and has insomnia.    Blood pressure (!) 133/95, pulse (!) 113, temperature 98.2 F (36.8 C), temperature source Oral, resp. rate 16, height 5\' 6"  (1.676 m), last menstrual period 07/24/2018, SpO2 100 %. Body mass index is 45.03 kg/m.   COGNITIVE FEATURES THAT CONTRIBUTE TO RISK:  None    SUICIDE RISK:    Moderate:  Frequent suicidal ideation with limited intensity, and duration, some specificity in terms of plans, no associated intent, good self-control, limited dysphoria/symptomatology, some risk factors present, and identifiable protective factors, including available and accessible social support.   PLAN OF CARE: See H & P  I certify that inpatient services furnished can reasonably be expected to improve the patient's condition.   Nicholes Rough, NP 10/23/2022, 7:45 PM

## 2022-10-23 NOTE — Progress Notes (Signed)
Per Night [facility] Wynonia Hazard, RN labs have been reviewed and pt is clear to transfer to [facility]. See this CSW's previous note for accepting information. Nursing/ care team was updated by [facility].   Benjaman Kindler, MSW, LCSWA 10/23/2022 1:12 AM

## 2022-10-23 NOTE — H&P (Signed)
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  To be determined   Discharge Concerns:  Safety, medication compliance, mood stability  Estimated LOS: 5-7 days  Other:  N/A   Labs reviewed: Blood sugar elevated at 11 AM this morning at 382.  Consult to diabetic consultant placed.   PLAN Safety and Monitoring: Voluntary admission to inpatient psychiatric unit for safety, stabilization and treatment Daily contact with patient to assess and evaluate symptoms and progress in treatment Patient's case to be discussed in multi-disciplinary team meeting Observation Level : q15 minute checks Vital signs: q12 hours Precautions: Safety  Long Term Goal(s): Improvement in symptoms so as ready for discharge  Short Term Goals: Ability to identify changes in lifestyle to reduce recurrence of condition will improve, Ability to disclose and discuss suicidal ideas, Ability to demonstrate self-control  will improve, Ability to identify and develop effective coping behaviors will improve, Ability to maintain clinical measurements within normal limits will improve, Compliance with prescribed medications will improve, and Ability to identify triggers associated with substance abuse/mental health issues will improve  Diagnoses:  Principal Problem:   Bipolar 2 disorder, major depressive episode ([place]) Active Problems:   PTSD (post-traumatic stress disorder)   Uncontrolled type 2 diabetes mellitus with hyperglycemia ([place])   Essential hypertension   Hyperlipidemia   Asthma   GAD (generalized anxiety disorder)  Medications -Start Abilify 5 mg daily for depressive symptoms -Start hydroxyzine 50 mg nightly for sleep -Start Inderal 10 mg twice daily for tachycardia/anxiety -Start hydroxyzine 25 mg 3 times daily as needed for anxiety -Continue Avapro 75 mg daily for hypertension -Continue insulin NovoLog 0 to 5 units, and 0 to 15 units as per sliding scale for diabetes (please see the MAR for complete orders) DM consult placed -Start insulin 70/30 15 units twice daily with meals as well nutritional consultant's recommendations -Start albuterol inhaler as needed for shortness of breath or wheezing -Continue Lipitor 20 mg daily for hyperlipidemia  Other PRNS -Continue Tylenol 650 mg every 6 hours PRN for mild pain -Continue Maalox 30 mg every 4 hrs PRN for indigestion -Continue Imodium 2-4 mg as needed for diarrhea -Continue Milk of Magnesia as needed every 6 hrs for constipation -Continue Zofran disintegrating tabs every 6 hrs PRN for nausea  Discharge Planning: Social work and case management to assist with discharge planning and identification of hospital follow-up needs prior to discharge Estimated LOS: 5-7 days Discharge Concerns: Need to establish a safety plan; Medication compliance and effectiveness Discharge Goals: Return home with outpatient referrals for mental health follow-up  including medication management/psychotherapy  I certify that inpatient services furnished can reasonably be expected to improve the patient's condition.    Nicholes Rough, NP 3/20/20247:42 PM

## 2022-10-23 NOTE — Group Note (Signed)
Recreation Therapy Group Note   Group Topic:Team Building  Group Date: 10/23/2022 Start Time: 0935 End Time: 1007 Facilitators: Lillymae Duet-McCall, LRT,CTRS Location: [address] Dayroom   Goal Area(s) Addresses:  Patient will effectively work with peer towards shared goal.  Patient will identify skills used to make activity successful.  Patient will identify how skills used during activity can be used to reach post d/c goals.    Group Description: Landing Pad. In teams of 3-5, patients were given 12 plastic drinking straws and an equal length of masking tape. Using the materials provided, patients were asked to build a landing pad to catch a golf ball dropped from approximately 5 feet in the air. All materials were required to be used by the team in their design. LRT facilitated post-activity discussion.   Affect/Mood: Appropriate   Participation Level: Engaged   Participation Quality: Independent   Behavior: Appropriate   Speech/Thought Process: Focused   Insight: Good   Judgement: Good   Modes of Intervention: STEM Activity   Patient Response to Interventions:  Engaged   Education Outcome:  Acknowledges education and In group clarification offered    Clinical Observations/Individualized Feedback: Pt was bright and engaged with peers.  Pt helped with putting the structure together and was focused throughout group.     Plan: Continue to engage patient in RT group sessions 2-3x/week.   Daysean Tinkham-McCall, LRT,CTRS 10/23/2022 12:31 PM

## 2022-10-23 NOTE — Progress Notes (Signed)
Patient ID: Joan [name], female   DOB: [birth year], 42 y.o.   MRN: [id number] Admission note: Patient is a  Voluntary in admission in no acute distress for depression, anxiety and AH to kill herself. Pt stated she stopped taking her prescribed Zoloft after 6 months ago because she felt better. Pt stated in the past 3 weeks she has stopped taking her insulin in an attempt to kill herself. Pt reports a history of cutting with a razor.  Pt admitted to unit per protocol, skin assessment and belonging search done. No skin issues noted. Consent signed by pt. Pt educated on therapeutic milieu rules. Pt was introduced to milieu by nursing staff. Fall risk / suicide safety plan explained to the patient. 15 minutes checks started for safety.

## 2022-10-23 NOTE — Progress Notes (Signed)
Chaplain met with Joan [name] to provide grief support after loss of both parents and 9 miscarriages as well as the loss of trust that she has in others. Of significance, she has had her autonomy taken away in several religious contexts as well as in several core relationships and this impacts her ability to trust. She has been able to set boundaries where she is able to.  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 She knows that she may need to make a change professionally because it is difficult to support others through the grief process when she herself is in such grief.  She has found it helpful to be able to talk to the support team here.  Chaplain plans to follow up tomorrow.  Chaplain Janne Napoleon, Douglas PAger, [phone]

## 2022-10-23 NOTE — ED Notes (Signed)
Confirmed pt to be received at [facility] per Manus Rudd, RN. [facility] called for transportation.

## 2022-10-23 NOTE — Progress Notes (Signed)
NUTRITION ASSESSMENT  RD consulted for nutrition assessment and diet education.  INTERVENTION: 1. Provided "CHO Counting" handout in AVS 2. Supplements: Ensure MAX Protein po daily, each supplement provides 150 kcal and 30 grams of protein  3. Will place referral to [facility] for outpatient management  NUTRITION DIAGNOSIS: Unintentional weight loss related to sub-optimal intake as evidenced by pt report.   Goal: Pt to meet >/= 90% of their estimated nutrition needs.  Monitor:  PO intake  Assessment:  Pt admitted with depression, anxiety and SI. Pt stopped taking her insulin for her diabetes.  HgbA1c from 2/22 was 13.7. Will place diabetes diet handout in AVS.  Will order daily Ensure Max, high protein, low calorie and CHO supplement.   Height: Ht Readings from Last 1 Encounters:  10/23/22 5\' 6"  (1.676 m)    Weight: Wt Readings from Last 1 Encounters:  10/22/22 126.6 kg    Weight Hx: Wt Readings from Last 10 Encounters:  10/22/22 126.6 kg  09/26/22 129 kg  09/16/22 124.7 kg  08/26/22 124.7 kg  07/19/22 122.5 kg  04/10/22 117.9 kg  12/25/21 121.6 kg  08/21/21 124.3 kg  07/02/21 129.3 kg  04/25/21 129.3 kg    BMI:  Body mass index is 45.03 kg/m. Pt meets criteria for morbid obesity based on current BMI.  Estimated Nutritional Needs: Kcal: 25-30 kcal/kg Protein: > 1 gram protein/kg Fluid: 1 ml/kcal  Diet Order:  Diet Order             Diet regular Room service appropriate? Yes; Fluid consistency: Thin  Diet effective now                  Pt is also offered choice of unit snacks mid-morning and mid-afternoon.   Lab results and medications reviewed.   Joan Bibles, MS, RD, LDN Inpatient Clinical Dietitian Contact information available via Amion

## 2022-10-23 NOTE — Transitions of Care (Post Inpatient/ED Visit) (Signed)
   10/23/2022  Name: Joan [name] MRN: [id number] DOB: [birth year]  Today's TOC FU Call Status: Today's TOC FU Call Status:: Unsuccessul Call (1st Attempt) (Unable to complete TOC, per EPIC pt admitted to [facility].)  Attempted to reach the patient regarding the most recent Inpatient/ED visit.  Follow Up Plan: No further outreach attempts will be made at this time. We have been unable to contact the patient.  Signature  Ferne Reus, RN

## 2022-10-23 NOTE — Progress Notes (Signed)
   10/23/22 1130  Psych Admission Type (Psych Patients Only)  Admission Status Voluntary  Psychosocial Assessment  Patient Complaints Anxiety  Eye Contact Brief  Facial Expression Anxious  Affect Anxious;Apprehensive  Speech Slow;Logical/coherent  Interaction Assertive  Motor Activity Fidgety  Appearance/Hygiene Unremarkable  Behavior Characteristics Cooperative  Mood Depressed;Anxious  Thought Process  Coherency WDL  Content WDL  Delusions WDL  Perception WDL  Hallucination None reported or observed  Judgment Poor  Confusion None  Danger to Self  Current suicidal ideation? Denies  Danger to Others  Danger to Others None reported or observed   Pt is pleasant on approach.  Denies SI/HI/AVH.  Pt on insulin before meals and at bedtime.  BS  382 at lunch and 288 at dinner.

## 2022-10-23 NOTE — Discharge Instructions (Signed)

## 2022-10-23 NOTE — [facility] Group Notes (Signed)
Pt did not attend NA

## 2022-10-23 NOTE — BH IP Treatment Plan (Signed)
Interdisciplinary Treatment and Diagnostic Plan Update  10/23/2022 Time of Session: 11:30 AM  Joan [name] MRN: [id number]  Principal Diagnosis: MDD (major depressive disorder), recurrent, severe, with psychosis ([place])  Secondary Diagnoses: Principal Problem:   MDD (major depressive disorder), recurrent, severe, with psychosis ([place]) Active Problems:   MDD (major depressive disorder), recurrent severe, without psychosis ([place])   Current Medications:  Current Facility-Administered Medications  Medication Dose Route Frequency Provider Last Rate Last Admin   acetaminophen (TYLENOL) tablet 650 mg  650 mg Oral Q6H PRN Onuoha, Chinwendu V, NP   650 mg at 10/23/22 0852   alum & mag hydroxide-simeth (MAALOX/MYLANTA) 200-200-20 MG/5ML suspension 30 mL  30 mL Oral Q4H PRN Onuoha, Chinwendu V, NP       atorvastatin (LIPITOR) tablet 20 mg  20 mg Oral Daily Hezzie Bump, NP   20 mg at 10/23/22 [id number]   diphenhydrAMINE (BENADRYL) capsule 50 mg  50 mg Oral TID PRN Hezzie Bump, NP       Or   diphenhydrAMINE (BENADRYL) injection 50 mg  50 mg Intramuscular TID PRN Hezzie Bump, NP       diphenhydrAMINE (BENADRYL) capsule 50 mg  50 mg Oral TID PRN Onuoha, Chinwendu V, NP       Or   diphenhydrAMINE (BENADRYL) injection 50 mg  50 mg Intramuscular TID PRN Onuoha, Chinwendu V, NP       haloperidol (HALDOL) tablet 5 mg  5 mg Oral TID PRN Hezzie Bump, NP       Or   haloperidol lactate (HALDOL) injection 5 mg  5 mg Intramuscular TID PRN Hezzie Bump, NP       haloperidol (HALDOL) tablet 5 mg  5 mg Oral TID PRN Onuoha, Chinwendu V, NP       Or   haloperidol lactate (HALDOL) injection 5 mg  5 mg Intramuscular TID PRN Onuoha, Chinwendu V, NP       hydrOXYzine (ATARAX) tablet 25 mg  25 mg Oral TID PRN Onuoha, Chinwendu V, NP       insulin aspart (novoLOG) injection 0-15 Units  0-15 Units Subcutaneous TID WC Hezzie Bump, NP   15 Units at 10/23/22 1135   insulin aspart (novoLOG)  injection 0-5 Units  0-5 Units Subcutaneous QHS Hezzie Bump, NP       irbesartan (AVAPRO) tablet 75 mg  75 mg Oral Daily Hezzie Bump, NP   75 mg at 10/23/22 0851   LORazepam (ATIVAN) tablet 2 mg  2 mg Oral TID PRN Hezzie Bump, NP       Or   LORazepam (ATIVAN) injection 2 mg  2 mg Intramuscular TID PRN Hezzie Bump, NP       LORazepam (ATIVAN) tablet 2 mg  2 mg Oral TID PRN Onuoha, Chinwendu V, NP       Or   LORazepam (ATIVAN) injection 2 mg  2 mg Intramuscular TID PRN Onuoha, Chinwendu V, NP       magnesium hydroxide (MILK OF MAGNESIA) suspension 30 mL  30 mL Oral Daily PRN Onuoha, Chinwendu V, NP       PTA Medications: Medications Prior to Admission  Medication Sig Dispense Refill Last Dose   albuterol (VENTOLIN HFA) 108 (90 Base) MCG/ACT inhaler Inhale 1 puff into the lungs every 4 (four) hours as needed for wheezing or shortness of breath.      atorvastatin (LIPITOR) 20 MG tablet Take 1 tablet (20 mg total) by mouth daily.  for cholesterol. 90 tablet 0    Dulaglutide (TRULICITY) [id number] [id number] SOPN Inject 0.75 mg into the skin once a week. for diabetes. (Patient not taking: Reported on 10/22/2022) 2 mL 0    insulin aspart protamine - aspart (NOVOLOG 70/30 FLEXPEN RELION) (70-30) 100 UNIT/ML FlexPen Inject 40 units into the skin every morning and 36 units every evening for diabetes. (Patient taking differently: 50 Units 2 (two) times daily before a meal.) 30 mL 3    valsartan (DIOVAN) 80 MG tablet Take 1 tablet (80 mg total) by mouth daily. for blood pressure. 30 tablet 0     Patient Stressors: Health problems   Medication change or noncompliance    Patient Strengths: Average or above average intelligence  Financial means  General fund of knowledge  Motivation for treatment/growth  Supportive family/friends   Treatment Modalities: Medication Management, Group therapy, Case management,  1 to 1 session with clinician, Psychoeducation, Recreational  therapy.   Physician Treatment Plan for Primary Diagnosis: MDD (major depressive disorder), recurrent, severe, with psychosis ([place]) Long Term Goal(s):     Short Term Goals:    Medication Management: Evaluate patient's response, side effects, and tolerance of medication regimen.  Therapeutic Interventions: 1 to 1 sessions, Unit Group sessions and Medication administration.  Evaluation of Outcomes: Not Progressing  Physician Treatment Plan for Secondary Diagnosis: Principal Problem:   MDD (major depressive disorder), recurrent, severe, with psychosis ([place]) Active Problems:   MDD (major depressive disorder), recurrent severe, without psychosis ([place])  Long Term Goal(s):     Short Term Goals:       Medication Management: Evaluate patient's response, side effects, and tolerance of medication regimen.  Therapeutic Interventions: 1 to 1 sessions, Unit Group sessions and Medication administration.  Evaluation of Outcomes: Not Progressing   RN Treatment Plan for Primary Diagnosis: MDD (major depressive disorder), recurrent, severe, with psychosis ([place]) Long Term Goal(s): Knowledge of disease and therapeutic regimen to maintain health will improve  Short Term Goals: Ability to remain free from injury will improve, Ability to verbalize frustration and anger appropriately will improve, Ability to demonstrate self-control, Ability to participate in decision making will improve, Ability to verbalize feelings will improve, Ability to disclose and discuss suicidal ideas, Ability to identify and develop effective coping behaviors will improve, and Compliance with prescribed medications will improve  Medication Management: RN will administer medications as ordered by provider, will assess and evaluate patient's response and provide education to patient for prescribed medication. RN will report any adverse and/or side effects to prescribing provider.  Therapeutic Interventions: 1 on 1 counseling  sessions, Psychoeducation, Medication administration, Evaluate responses to treatment, Monitor vital signs and CBGs as ordered, Perform/monitor CIWA, COWS, AIMS and Fall Risk screenings as ordered, Perform wound care treatments as ordered.  Evaluation of Outcomes: Not Met   LCSW Treatment Plan for Primary Diagnosis: MDD (major depressive disorder), recurrent, severe, with psychosis ([place]) Long Term Goal(s): Safe transition to appropriate next level of care at discharge, Engage patient in therapeutic group addressing interpersonal concerns.  Short Term Goals: Engage patient in aftercare planning with referrals and resources, Increase social support, Increase ability to appropriately verbalize feelings, Increase emotional regulation, Facilitate acceptance of mental health diagnosis and concerns, Facilitate patient progression through stages of change regarding substance use diagnoses and concerns, Identify triggers associated with mental health/substance abuse issues, and Increase skills for wellness and recovery  Therapeutic Interventions: Assess for all discharge needs, 1 to 1 time with Social worker, Explore available resources and support systems, Assess  for adequacy in community support network, Educate family and significant other(s) on suicide prevention, Complete Psychosocial Assessment, Interpersonal group therapy.  Evaluation of Outcomes: Not Met   Progress in Treatment: Attending groups: Yes. Participating in groups: Yes. Taking medication as prescribed: Yes. Toleration medication: Yes. Family/Significant other contact made: No, will contact:  Whoever, pt gives CSW permission to contact  Patient understands diagnosis: Yes Discussing patient identified problems/goals with staff: Yes. Medical problems stabilized or resolved: Yes. Denies suicidal/homicidal ideation: Yes. Issues/concerns per patient self-inventory: No.  New problem(s) identified: No, Describe:  None Reported   New  Short Term/Long Term Goal(s): medication stabilization, elimination of SI thoughts, development of comprehensive mental wellness plan.    Patient Goals:  " not be so depressed and find healthy coping skills "   Discharge Plan or Barriers: Patient recently admitted. CSW will continue to follow and assess for appropriate referrals and possible discharge planning.    Reason for Continuation of Hospitalization: Anxiety Depression Hallucinations Medication stabilization Suicidal ideation  Estimated Length of Stay: 3-5 days   Last 3 [place] Suicide Severity Risk Score: Flowsheet Row Admission (Current) from 10/23/2022 in [place] [facility] Most recent reading at 10/23/2022  2:41 AM ED from 10/22/2022 in [facility] Emergency Department at [facility] Most recent reading at 10/22/2022  7:15 PM ED from 10/22/2022 in [facility] Most recent reading at 10/22/2022 11:31 AM  C-SSRS RISK CATEGORY High Risk High Risk No Risk       Last PHQ 2/9 Scores:    09/26/2022    2:11 PM 01/26/2021    3:07 PM 08/30/2019    3:46 PM  Depression screen PHQ 2/9  Decreased Interest 0 0 0  Down, Depressed, Hopeless 1 0 0  PHQ - 2 Score 1 0 0  Altered sleeping 3 0   Tired, decreased energy 1 0   Change in appetite 3 0   Feeling bad or failure about yourself  0 0   Trouble concentrating 1 0   Moving slowly or fidgety/restless 0 0   Suicidal thoughts 0 0   PHQ-9 Score 9 0   Difficult doing work/chores  Not difficult at all     Scribe for Treatment Team: Sherre Lain, [place] 10/23/2022 2:03 PM

## 2022-10-24 ENCOUNTER — Ambulatory Visit: Payer: Self-pay | Admitting: Primary Care

## 2022-10-24 ENCOUNTER — Other Ambulatory Visit: Payer: Self-pay | Admitting: Primary Care

## 2022-10-24 DIAGNOSIS — F3181 Bipolar II disorder: Secondary | ICD-10-CM | POA: Diagnosis not present

## 2022-10-24 DIAGNOSIS — I10 Essential (primary) hypertension: Secondary | ICD-10-CM

## 2022-10-24 LAB — GLUCOSE, CAPILLARY
Glucose-Capillary: 156 mg/dL — ABNORMAL HIGH (ref 70–99)
Glucose-Capillary: 258 mg/dL — ABNORMAL HIGH (ref 70–99)
Glucose-Capillary: 283 mg/dL — ABNORMAL HIGH (ref 70–99)
Glucose-Capillary: 362 mg/dL — ABNORMAL HIGH (ref 70–99)

## 2022-10-24 MED ORDER — SERTRALINE HCL 25 MG PO TABS
25.0000 mg | ORAL_TABLET | Freq: Once | ORAL | Status: DC
  Filled 2022-10-24: qty 1

## 2022-10-24 MED ORDER — SERTRALINE HCL 50 MG PO TABS
50.0000 mg | ORAL_TABLET | Freq: Every day | ORAL | Status: DC
  Filled 2022-10-24: qty 1

## 2022-10-24 MED ORDER — CLONIDINE HCL 0.1 MG PO TABS: 0.1000 mg | ORAL_TABLET | Freq: Two times a day (BID) | ORAL | Status: DC | PRN

## 2022-10-24 NOTE — Progress Notes (Signed)
   10/24/22 2200  Psych Admission Type (Psych Patients Only)  Admission Status Voluntary  Psychosocial Assessment  Patient Complaints Anxiety  Eye Contact Fair  Facial Expression Anxious  Affect Anxious  Speech Logical/coherent  Interaction Assertive  Motor Activity Slow  Appearance/Hygiene Unremarkable  Behavior Characteristics Cooperative  Mood Pleasant  Thought Process  Coherency WDL  Content WDL  Delusions None reported or observed  Perception WDL  Hallucination None reported or observed  Judgment Poor  Confusion None  Danger to Self  Current suicidal ideation? Denies  Danger to Others  Danger to Others None reported or observed   Alert/oriented. Makes needs/concerns known to staff. Pleasant cooperative with staff. Denies SI/HI/A/V hallucinations. Med compliant. Patient states went to group. Will encourage continued compliance and progression towards goals. Verbally contracted for safety. Will continue to monitor.

## 2022-10-24 NOTE — Group Note (Signed)
Date:  10/24/2022 Time:  4:51 PM  Group Topic/Focus:  Wellness Toolbox:   The focus of this group is to discuss various aspects of wellness, balancing those aspects and exploring ways to increase the ability to experience wellness.  Patients will create a wellness toolbox for use upon discharge.    Participation Level:  Active  Participation Quality:  Appropriate  Affect:  Appropriate  Cognitive:  Appropriate  Insight: Appropriate  Engagement in Group:  Engaged  Modes of Intervention:  Discussion  Additional Comments:     Joan [name] 10/24/2022, 4:51 PM
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
## 2022-10-24 NOTE — [facility] Group Notes (Signed)
[facility] Group Notes:  (Nursing/MHT/Case Management/Adjunct)  Date:  10/24/2022  Time:  9:35 PM  Type of Therapy:  Group Therapy  Participation Level:  Active  Participation Quality:  Appropriate  Affect:  Appropriate  Cognitive:  Appropriate  Insight:  Appropriate  Engagement in Group:  Engaged  Modes of Intervention:  Education  Summary of Progress/Problems: Goal to stay awake more, pt reports she did better today. Participated in anger management exercise. Day 9/10.  Orvan Falconer 10/24/2022, 9:35 PM

## 2022-10-24 NOTE — Progress Notes (Signed)
[facility] MD Progress Note  10/24/2022 6:08 PM Joan [name]  MRN:  [id number] Principal Problem: Bipolar 2 disorder, major depressive episode ([place]) Diagnosis: Principal Problem:   Bipolar 2 disorder, major depressive episode ([place]) Active Problems:   PTSD (post-traumatic stress disorder)   Uncontrolled type 2 diabetes mellitus with hyperglycemia ([place])   Essential hypertension   Hyperlipidemia   Asthma   GAD (generalized anxiety disorder)  Reason for Admission: Joan [name] is a 42 yo Caucasian female with prior mental health history of bipolar disorder, PTSD and GAD who drove herself to the [facility] health urgent care on 03/19 with complaints of worsening depressive symptoms and suicidal ideations with a plan to drive herself off an embankment or into a tree to end her life. She also reported that she stopped taking her insulin with hopes that the elevated blood glucose would kill her. Pt was transferred to the [facility] ER d/t elevated blood glucose, medically cleared prior to being transferred voluntarily to this [facility] for treatment and stabilization of her mental status.   24-hour chart review: Vital signs with consistent elevations in heart rate and blood pressure.  Diastolic blood pressure running from 90s to very low 100s.  Patient remains compliant with scheduled medications, only as needed medication received in the past 24 hours has been Tylenol.  Patient is attending unit group sessions and appropriately interacting with peers.  Patient assessment note, 10/24/2022: Pt remains with flat affect and depressed mood, attention to personal hygiene and grooming is fair, eye contact is good, speech is clear & coherent. Thought contents are organized and logical, and pt currently denies SI/HI/AVH or paranoia. There is no evidence of delusional thoughts.    She reports that her sleep quality last night was good, states appetite is good, reports that Ensure has been ordered for her  but she does not need it, will DC this.  She reports back pain of 3-4, but states it is chronic.  She reports her depression as being a 6 today 10 being worse,   and scores same for anxiety.  We will start Zoloft 25 mg today, and increase to 50 mg tomorrow for management of depressive symptoms.  We will continue other medications as listed below.  Patient complained of slight dizziness after taking Abilify earlier today morning, she was educated that if this is persistent, we will continue switching Abilify to nighttime.  We will add clonidine 0.1 mg twice daily as needed for for SBP higher than 160 or DBP higher than 100.  We will continue other medications as listed below.  Total Time spent with patient: 45 minutes  Past Psychiatric History: See H & P  Past Medical History:  Past Medical History:  Diagnosis Date   Acute asthma exacerbation 04/10/2018   Acute non-recurrent maxillary sinusitis 09/25/2018   Asthma    Asthma exacerbation 08/03/2020   Auditory hallucinations    only after anesthesia   BV (bacterial vaginosis) 02/13/2018   COVID-19 virus infection 08/16/2019   Diabetes mellitus without complication ([place])    diet controlled   Diabetes mellitus, type II ([place])    insulin, jardiance   Dyspnea    with exertion   Essential hypertension    Essential hypertension 11/24/2017   GERD (gastroesophageal reflux disease)    occasionally-NO MEDS   History of placement of ear tubes 05/20/2018   Hyperlipidemia    Kidney stone 02/11/2019   Localized skin mass, lump, or swelling 01/26/2021   MDD (major depressive  disorder)    Miscarriage    Nausea & vomiting 10/05/2019   PTSD (post-traumatic stress disorder)    Right lower quadrant abdominal pain 02/13/2018   Tachycardia     Past Surgical History:  Procedure Laterality Date   ABDOMINAL HYSTERECTOMY     ADENOIDECTOMY     CHOLECYSTECTOMY  2009   CYSTOSCOPY N/A 07/31/2018   Procedure: CYSTOSCOPY;  Surgeon: Benjaman Kindler, MD;   Location: [facility] ORS;  Service: Gynecology;  Laterality: N/A;   LAPAROSCOPIC BILATERAL SALPINGECTOMY Bilateral 07/31/2018   Procedure: LAPAROSCOPIC BILATERAL SALPINGECTOMY;  Surgeon: Benjaman Kindler, MD;  Location: [facility] ORS;  Service: Gynecology;  Laterality: Bilateral;   LAPAROSCOPIC HYSTERECTOMY N/A 07/31/2018   Procedure: HYSTERECTOMY TOTAL LAPAROSCOPIC;  Surgeon: Benjaman Kindler, MD;  Location: [facility] ORS;  Service: Gynecology;  Laterality: N/A;   LAPAROSCOPY N/A 06/07/2019   Procedure: LAPAROSCOPY OPERATIVE, WITH PERITONEAL BIOPSIES;  Surgeon: Benjaman Kindler, MD;  Location: [facility] ORS;  Service: Gynecology;  Laterality: N/A;   LYSIS OF ADHESION N/A 07/31/2018   Procedure: LYSIS OF ADHESION;  Surgeon: Benjaman Kindler, MD;  Location: [facility] ORS;  Service: Gynecology;  Laterality: N/A;   OVARY SURGERY Right    cyst removed a while ago   TONSILLECTOMY     tubes in ear     Family History:  Family History  Problem Relation Age of Onset   Asthma Mother    Diabetes Mother    Hyperlipidemia Mother    Hypertension Mother    Diabetes Father    Hyperlipidemia Father    Hypertension Father    Diabetes Brother    Depression Brother    Alcohol abuse Brother    Depression Maternal Grandmother    Stroke Paternal Grandmother    Breast cancer Neg Hx    Family Psychiatric  History: See H & P Social History:  Social History   Substance and Sexual Activity  Alcohol Use No     Social History   Substance and Sexual Activity  Drug Use No    Social History   Socioeconomic History   Marital status: Married    Spouse name: chip   Number of children: 0   Years of education: Not on file   Highest education level: High school graduate  Occupational History   Occupation: day care worker  Tobacco Use   Smoking status: Never   Smokeless tobacco: Never  Vaping Use   Vaping Use: Never used  Substance and Sexual Activity   Alcohol use: No   Drug use: No   Sexual activity: Not on file  Other  Topics Concern   Not on file  Social History Narrative   Not on file   Social Determinants of Health   Financial Resource Strain: Low Risk  (04/27/2018)   Overall Financial Resource Strain (CARDIA)    Difficulty of Paying Living Expenses: Not hard at all  Food Insecurity: No Food Insecurity (10/23/2022)   Hunger Vital Sign    Worried About Running Out of Food in the Last Year: Never true    Ran Out of Food in the Last Year: Never true  Transportation Needs: No Transportation Needs (10/23/2022)   PRAPARE - Hydrologist (Medical): No    Lack of Transportation (Non-Medical): No  Physical Activity: Inactive (04/27/2018)   Exercise Vital Sign    Days of Exercise per Week: 0 days    Minutes of Exercise per Session: 0 min  Stress: Stress Concern Present (04/27/2018)   [employer]  [employer] - Occupational Stress Questionnaire    Feeling of Stress : Very much  Social Connections: Unknown (04/27/2018)   Social Connection and Isolation Panel [NHANES]    Frequency of Communication with Friends and Family: Not on file    Frequency of Social Gatherings with Friends and Family: Not on file    Attends Religious Services: More than 4 times per year    Active Member of [employer] or Organizations: No    Attends Music therapist: Never    Marital Status: Married   Sleep: Good  Appetite:  Good  Current Medications: Current Facility-Administered Medications  Medication Dose Route Frequency Provider Last Rate Last Admin   acetaminophen (TYLENOL) tablet 650 mg  650 mg Oral Q6H PRN Onuoha, Chinwendu V, NP   650 mg at 10/23/22 0852   albuterol (VENTOLIN HFA) 108 (90 Base) MCG/ACT inhaler 2 puff  2 puff Inhalation Q6H PRN Eleni Frank, NP       alum & mag hydroxide-simeth (MAALOX/MYLANTA) 200-200-20 MG/5ML suspension 30 mL  30 mL Oral Q4H PRN Onuoha, Chinwendu V, NP       ARIPiprazole (ABILIFY) tablet 5 mg  5 mg Oral Daily Nicholes Rough, NP   5 mg  at 10/24/22 0806   atorvastatin (LIPITOR) tablet 20 mg  20 mg Oral Daily Hezzie Bump, NP   20 mg at 10/24/22 1304   diphenhydrAMINE (BENADRYL) capsule 50 mg  50 mg Oral TID PRN Hezzie Bump, NP       Or   diphenhydrAMINE (BENADRYL) injection 50 mg  50 mg Intramuscular TID PRN Hezzie Bump, NP       diphenhydrAMINE (BENADRYL) capsule 50 mg  50 mg Oral TID PRN Onuoha, Chinwendu V, NP       Or   diphenhydrAMINE (BENADRYL) injection 50 mg  50 mg Intramuscular TID PRN Onuoha, Chinwendu V, NP       feeding supplement (ENSURE ENLIVE / ENSURE PLUS) liquid 237 mL  237 mL Oral QPC breakfast Massengill, Nathan, MD       haloperidol (HALDOL) tablet 5 mg  5 mg Oral TID PRN Hezzie Bump, NP       Or   haloperidol lactate (HALDOL) injection 5 mg  5 mg Intramuscular TID PRN Hezzie Bump, NP       haloperidol (HALDOL) tablet 5 mg  5 mg Oral TID PRN Onuoha, Chinwendu V, NP       Or   haloperidol lactate (HALDOL) injection 5 mg  5 mg Intramuscular TID PRN Onuoha, Chinwendu V, NP       hydrOXYzine (ATARAX) tablet 25 mg  25 mg Oral TID PRN Onuoha, Chinwendu V, NP       hydrOXYzine (ATARAX) tablet 50 mg  50 mg Oral QHS Harrol Novello, NP   50 mg at 10/23/22 2135   insulin aspart (novoLOG) injection 0-15 Units  0-15 Units Subcutaneous TID WC Hezzie Bump, NP   8 Units at 10/24/22 1732   insulin aspart (novoLOG) injection 0-5 Units  0-5 Units Subcutaneous QHS Hezzie Bump, NP   4 Units at 10/23/22 2129   insulin aspart protamine- aspart (NOVOLOG MIX 70/30) injection 15 Units  15 Units Subcutaneous BID WC Davita Sublett, NP   15 Units at 10/24/22 1732   irbesartan (AVAPRO) tablet 75 mg  75 mg Oral Daily Hezzie Bump, NP   75 mg at 10/24/22 0806   LORazepam (ATIVAN) tablet 2 mg  2 mg Oral TID PRN Tora Perches,  Creola Corn, NP       Or   LORazepam (ATIVAN) injection 2 mg  2 mg Intramuscular TID PRN Hezzie Bump, NP       LORazepam (ATIVAN) tablet 2 mg  2 mg Oral TID PRN Onuoha,  Chinwendu V, NP       Or   LORazepam (ATIVAN) injection 2 mg  2 mg Intramuscular TID PRN Onuoha, Chinwendu V, NP       magnesium hydroxide (MILK OF MAGNESIA) suspension 30 mL  30 mL Oral Daily PRN Onuoha, Chinwendu V, NP       propranolol (INDERAL) tablet 10 mg  10 mg Oral BID Nicholes Rough, NP   10 mg at 10/24/22 0806   [START ON 10/26/2022] sertraline (ZOLOFT) tablet 50 mg  50 mg Oral Daily Nicholes Rough, NP        Lab Results:  Results for orders placed or performed during the hospital encounter of 10/23/22 (from the past 48 hour(s))  Glucose, capillary     Status: Abnormal   Collection Time: 10/23/22  6:49 AM  Result Value Ref Range   Glucose-Capillary 224 (H) 70 - 99 mg/dL    Comment: Glucose reference range applies only to samples taken after fasting for at least 8 hours.  Glucose, capillary     Status: Abnormal   Collection Time: 10/23/22 11:31 AM  Result Value Ref Range   Glucose-Capillary 382 (H) 70 - 99 mg/dL    Comment: Glucose reference range applies only to samples taken after fasting for at least 8 hours.  Glucose, capillary     Status: Abnormal   Collection Time: 10/23/22  4:58 PM  Result Value Ref Range   Glucose-Capillary 288 (H) 70 - 99 mg/dL    Comment: Glucose reference range applies only to samples taken after fasting for at least 8 hours.   Comment 1 Notify RN    Comment 2 Document in Chart   Glucose, capillary     Status: Abnormal   Collection Time: 10/23/22  9:09 PM  Result Value Ref Range   Glucose-Capillary 335 (H) 70 - 99 mg/dL    Comment: Glucose reference range applies only to samples taken after fasting for at least 8 hours.   Comment 1 Notify RN    Comment 2 Document in Chart   Glucose, capillary     Status: Abnormal   Collection Time: 10/24/22  5:51 AM  Result Value Ref Range   Glucose-Capillary 156 (H) 70 - 99 mg/dL    Comment: Glucose reference range applies only to samples taken after fasting for at least 8 hours.   Comment 1 Notify RN     Comment 2 Document in Chart   Glucose, capillary     Status: Abnormal   Collection Time: 10/24/22 12:12 PM  Result Value Ref Range   Glucose-Capillary 258 (H) 70 - 99 mg/dL    Comment: Glucose reference range applies only to samples taken after fasting for at least 8 hours.  Glucose, capillary     Status: Abnormal   Collection Time: 10/24/22  5:26 PM  Result Value Ref Range   Glucose-Capillary 283 (H) 70 - 99 mg/dL    Comment: Glucose reference range applies only to samples taken after fasting for at least 8 hours.    Blood Alcohol level:  Lab Results  Component Value Date   [facility] <10 10/22/2022   ETH <10 [id number]    Metabolic Disorder Labs: Lab Results  Component Value Date   HGBA1C  12.6 (H) 10/22/2022   MPG 315 10/22/2022   MPG 298 01/12/2021   Lab Results  Component Value Date   PROLACTIN 11.1 10/22/2022   PROLACTIN 14.5 05/05/2015   Lab Results  Component Value Date   CHOL 247 (H) 10/22/2022   TRIG 297 (H) 10/22/2022   HDL 65 10/22/2022   CHOLHDL 3.8 10/22/2022   VLDL 59 (H) 10/22/2022   LDLCALC 123 (H) 10/22/2022   [place]  09/03/2019     Comment:     . LDL cholesterol not calculated. Triglyceride levels greater than 400 mg/dL invalidate calculated LDL results. . Reference range: <100 . Desirable range <100 mg/dL for primary prevention;   <70 mg/dL for patients with CHD or diabetic patients  with > or = 2 CHD risk factors. Marland Kitchen LDL-C is now calculated using the Martin-Hopkins  calculation, which is a validated novel method providing  better accuracy than the Friedewald equation in the  estimation of LDL-C.  Cresenciano Genre et al. Annamaria Helling. [id number]): 2061-2068  (http://education.[employer].com/faq/FAQ164)     Physical Findings: AIMS:  , ,  ,  ,    CIWA:    COWS:     Musculoskeletal: Strength & Muscle Tone: within normal limits Gait & Station: normal Patient leans: N/A  Psychiatric Specialty Exam:  Presentation  General Appearance:   Appropriate for Environment; Fairly Groomed  Eye Contact: Good  Speech: Clear and Coherent  Speech Volume: Normal  Handedness: Right   Mood and Affect  Mood: Depressed; Anxious  Affect: Congruent   Thought Process  Thought Processes: Coherent  Descriptions of Associations:Intact  Orientation:Full (Time, Place and Person)  Thought Content:Logical  History of Schizophrenia/Schizoaffective disorder:No data recorded Duration of Psychotic Symptoms:No data recorded Hallucinations:Hallucinations: None  Ideas of Reference:None  Suicidal Thoughts:Suicidal Thoughts: No  Homicidal Thoughts:Homicidal Thoughts: No   Sensorium  Memory: Immediate Good  Judgment: Fair  Insight: Fair   Community education officer  Concentration: Good  Attention Span: Good  Recall: Good  Fund of Knowledge: Good  Language: Good   Psychomotor Activity  Psychomotor Activity: Psychomotor Activity: Normal   Assets  Assets: Resilience   Sleep  Sleep: Sleep: Good    Physical Exam: Physical Exam Constitutional:      Appearance: Normal appearance.  HENT:     Nose: Nose normal. No congestion or rhinorrhea.  Eyes:     Pupils: Pupils are equal, round, and reactive to light.  Pulmonary:     Effort: Pulmonary effort is normal.  Musculoskeletal:     Cervical back: Normal range of motion.  Neurological:     Mental Status: She is alert and oriented to person, place, and time.  Psychiatric:        Behavior: Behavior normal.    Review of Systems  Constitutional: Negative.   HENT: Negative.    Eyes: Negative.   Respiratory: Negative.    Cardiovascular: Negative.   Gastrointestinal: Negative.   Genitourinary: Negative.   Musculoskeletal: Negative.   Skin: Negative.   Neurological: Negative.  Negative for dizziness.  Psychiatric/Behavioral:  Positive for depression. Negative for hallucinations, memory loss, substance abuse and suicidal ideas. The patient is  nervous/anxious and has insomnia.    Blood pressure 117/74, pulse 91, temperature 98.2 F (36.8 C), temperature source Oral, resp. rate 18, height 5\' 6"  (1.676 m), last menstrual period 07/24/2018, SpO2 100 %. Body mass index is 45.03 kg/m.  Treatment Plan Summary: Daily contact with patient to assess and evaluate symptoms and progress in treatment and Medication management   Observation Level/Precautions:  15 minute checks  Laboratory:  Labs reviewed   Psychotherapy:  Unit Group sessions  Medications:  See [facility]  Consultations:  To be determined   Discharge Concerns:  Safety, medication compliance, mood stability  Estimated LOS: 5-7 days  Other:  N/A    Labs reviewed: Blood sugar elevated in the 200s today, but pt states that she ate a brownie during lunch and seems not to be compliant with her nutritional recommendations. This will be reiterated to her.   PLAN Safety and Monitoring: Voluntary admission to inpatient psychiatric unit for safety, stabilization and treatment Daily contact with patient to assess and evaluate symptoms and progress in treatment Patient's case to be discussed in multi-disciplinary team meeting Observation Level : q15 minute checks Vital signs: q12 hours Precautions: Safety   Long Term Goal(s): Improvement in symptoms so as ready for discharge   Short Term Goals: Ability to identify changes in lifestyle to reduce recurrence of condition will improve, Ability to disclose and discuss suicidal ideas, Ability to demonstrate self-control will improve, Ability to identify and develop effective coping behaviors will improve, Ability to maintain clinical measurements within normal limits will improve, Compliance with prescribed medications will improve, and Ability to identify triggers associated with substance abuse/mental health issues will improve   Diagnoses:  Principal Problem:   Bipolar 2 disorder, major depressive episode ([place]) Active Problems:   PTSD  (post-traumatic stress disorder)   Uncontrolled type 2 diabetes mellitus with hyperglycemia ([place])   Essential hypertension   Hyperlipidemia   Asthma   GAD (generalized anxiety disorder)   Medications -Start Zoloft 25 mg today, increase to 50 mg tomorrow (10/25/2022). -Start Clonidine 0.1 mg BID PRN for SBP >160 and DBP >100. -Continue Abilify 5 mg daily for depressive symptoms -Continue hydroxyzine 50 mg nightly for sleep -Continue Inderal 10 mg twice daily for tachycardia/anxiety -Continue hydroxyzine 25 mg 3 times daily as needed for anxiety -Continue Avapro 75 mg daily for hypertension -Continue insulin NovoLog 0 to 5 units, and 0 to 15 units as per sliding scale for diabetes (please see the [facility] for complete orders) DM consult placed -Continue insulin 70/30 15 units twice daily with meals as well nutritional consultant's recommendations -Continue albuterol inhaler as needed for shortness of breath or wheezing -Continue Lipitor 20 mg daily for hyperlipidemia   Other PRNS -Continue Tylenol 650 mg every 6 hours PRN for mild pain -Continue Maalox 30 mg every 4 hrs PRN for indigestion -Continue Imodium 2-4 mg as needed for diarrhea -Continue Milk of Magnesia as needed every 6 hrs for constipation -Continue Zofran disintegrating tabs every 6 hrs PRN for nausea   Discharge Planning: Social work and case management to assist with discharge planning and identification of hospital follow-up needs prior to discharge Estimated LOS: 5-7 days Discharge Concerns: Need to establish a safety plan; Medication compliance and effectiveness Discharge Goals: Return home with outpatient referrals for mental health follow-up including medication management/psychotherapy   I certify that inpatient services furnished can reasonably be expected to improve the patient's condition.    Nicholes Rough, NP 10/24/2022, 6:08 PM

## 2022-10-24 NOTE — Progress Notes (Signed)
Pt medication compliant and attending groups. Pt reports adequate sleep with use of vistaril last night. Pt reports depression is improving, denies suicidal thoughts. Q 15 minute checks ongoing.

## 2022-10-24 NOTE — Group Note (Signed)
Date:  10/24/2022 Time:  10:03 AM  Group Topic/Focus:  Orientation:   The focus of this group is to educate the patient on the purpose and policies of crisis stabilization and provide a format to answer questions about their admission.  The group details unit policies and expectations of patients while admitted.    Participation Level:  Active  Participation Quality:  Appropriate  Affect:  Appropriate  Cognitive:  Appropriate  Insight: Appropriate  Engagement in Group:  Engaged  Modes of Intervention:  Discussion  Additional Comments:     Jerrye Beavers 10/24/2022, 10:03 AM

## 2022-10-25 DIAGNOSIS — F3181 Bipolar II disorder: Secondary | ICD-10-CM | POA: Diagnosis not present

## 2022-10-25 LAB — GLUCOSE, CAPILLARY
Glucose-Capillary: 208 mg/dL — ABNORMAL HIGH (ref 70–99)
Glucose-Capillary: 288 mg/dL — ABNORMAL HIGH (ref 70–99)
Glucose-Capillary: 328 mg/dL — ABNORMAL HIGH (ref 70–99)
Glucose-Capillary: 364 mg/dL — ABNORMAL HIGH (ref 70–99)

## 2022-10-25 MED ORDER — SERTRALINE HCL 50 MG PO TABS
50.0000 mg | ORAL_TABLET | Freq: Every day | ORAL | Status: DC
  Administered 2022-10-25 – 2022-10-27 (×3): 50 mg via ORAL
  Filled 2022-10-25 (×4): qty 1

## 2022-10-25 NOTE — Progress Notes (Signed)
Chaplain met with Joan [name] for follow up support.  She reported that she is feeling somewhat better now that the medication is starting to work.  She has had good support from her husband and pastor and his wife and feels that she is well enough to go home.  Chaplain encouraged continued self-care and taking things she has learned here at the hospital.  [facility] [place], [place] Pager, [phone]

## 2022-10-25 NOTE — Group Note (Signed)
Recreation Therapy Group Note   Group Topic:Problem Solving  Group Date: 10/25/2022 Start Time: 0930 End Time: 1015 Facilitators: Caidynce Muzyka-McCall, LRT,CTRS Location: [address] Dayroom   Goal Area(s) Addresses:  Patient will effectively work with peer towards shared goal.  Patient will identify skills used to make activity successful.  Patient will share challenges and verbalize solution-driven approaches used. Patient will identify how skills used during activity can be used to reach post d/c goals.    Group Description: [employer]. Patients were provided the following materials: 2 drinking straws, 5 rubber bands, 5 paper clips, 2 index cards and 2 drinking cups. Using the provided materials patients were asked to build a launching mechanism to launch a ping pong ball across the room, approximately 10 feet. Patients were divided into teams of 3-5. Instructions required all materials be incorporated into the device, functionality of items left to the peer group's discretion.   Affect/Mood: N/A   Participation Level: Did not attend   Participation Quality: Independent   Behavior: Appropriate   Speech/Thought Process: Focused   Insight: Good   Judgement: Good   Modes of Intervention: STEM Activity   Patient Response to Interventions:  Engaged   Education Outcome:  Acknowledges education and In group clarification offered    Clinical Observations/Individualized Feedback: Pt attended and participated in group session.    Plan: Continue to engage patient in RT group sessions 2-3x/week.   Zayd Bonet-McCall, LRT,CTRS 10/25/2022 11:45 AM

## 2022-10-25 NOTE — Progress Notes (Signed)
   10/25/22 2255  Psych Admission Type (Psych Patients Only)  Admission Status Voluntary  Psychosocial Assessment  Patient Complaints None  Eye Contact Fair  Facial Expression Anxious  Affect Anxious  Speech Logical/coherent  Interaction Assertive  Motor Activity Slow  Appearance/Hygiene Unremarkable  Behavior Characteristics Cooperative  Mood Pleasant  Thought Process  Coherency WDL  Content WDL  Delusions None reported or observed  Perception WDL  Hallucination None reported or observed  Judgment Poor  Confusion None  Danger to Self  Current suicidal ideation? Denies  Danger to Others  Danger to Others None reported or observed   Alert/oriented. Makes needs/concerns known to staff. Pleasant cooperative with staff. Denies SI/HI/A/V hallucinations. Med compliant. Patient states went to group.  Will encourage continued compliance and progression towards goals. Verbally contracted for safety. Will continue to monitor.

## 2022-10-25 NOTE — Progress Notes (Signed)
[facility] MD Progress Note  10/25/2022 12:02 PM Joan [name]  MRN:  [id number] Principal Problem: Bipolar 2 disorder, major depressive episode ([place]) Diagnosis: Principal Problem:   Bipolar 2 disorder, major depressive episode ([place]) Active Problems:   PTSD (post-traumatic stress disorder)   Uncontrolled type 2 diabetes mellitus with hyperglycemia ([place])   Essential hypertension   Hyperlipidemia   Asthma   GAD (generalized anxiety disorder)  Reason for Admission: Joan [name] is a 42 yo Caucasian female with prior mental health history of bipolar disorder, PTSD and GAD who drove herself to the [facility] health urgent care on 03/19 with complaints of worsening depressive symptoms and suicidal ideations with a plan to drive herself off an embankment or into a tree to end her life. She also reported that she stopped taking her insulin with hopes that the elevated blood glucose would kill her. Pt was transferred to the [facility] ER d/t elevated blood glucose, medically cleared prior to being transferred voluntarily to this [facility] for treatment and stabilization of her mental status.   24-hour chart review: Vital signs with consistent elevations in heart rate and blood pressure.  Diastolic blood pressure running from 90s to very low 100s.  Patient remains compliant with scheduled medications, only as needed medication received in the past 24 hours has been Tylenol.  Patient is attending unit group sessions and appropriately interacting with peers.  Patient assessment note,  Upon evaluation today patient presents reporting had a good day yesterday with better mood feeling less depressed reports better sleep at night for the past 2 nights with no side effects to current medication regimen.  She reports better mood stability and continues to deny passive or active SI intention or plan, denies HI or AVH.  Upon completion of her evaluation patient reports she wants to be discharged on Sunday  then gets emotional and crying and wanting that she needs to attend church on Sunday because as her safe place and also need to do her school related homework on Sunday otherwise she is afraid of failing the class.  I discussed with patient plan to monitor her on the unit and discuss further discharge planning tomorrow. Patient reports fair appetite and energy level, reported by staff to be participating in groups and interacting well with the milieu.  With patient's permission I did contact her husband over the phone who visited her last night and will visit with regular tonight he reports despite the fact that patient does seem improved and more rested but she did continue to be emotional with no active SI reported during visit.  Despite the fact that he feels patient is not an imminent risk to harm self or others if discharged he would like for her to remain in the hospital for couple more days prior to discharge to ensure more stability, discussed with patient's husband will follow.  Total Time spent with patient: 35 minutes  Past Psychiatric History: See H & P  Past Medical History:  Past Medical History:  Diagnosis Date   Acute asthma exacerbation 04/10/2018   Acute non-recurrent maxillary sinusitis 09/25/2018   Asthma    Asthma exacerbation 08/03/2020   Auditory hallucinations    only after anesthesia   BV (bacterial vaginosis) 02/13/2018   COVID-19 virus infection 08/16/2019   Diabetes mellitus without complication (HCC)    diet controlled   Diabetes mellitus, type II (HCC)    insulin, jardiance   Dyspnea    with exertion   Essential hypertension  Essential hypertension 11/24/2017   GERD (gastroesophageal reflux disease)    occasionally-NO MEDS   History of placement of ear tubes 05/20/2018   Hyperlipidemia    Kidney stone 02/11/2019   Localized skin mass, lump, or swelling 01/26/2021   MDD (major depressive disorder)    Miscarriage    Nausea & vomiting 10/05/2019    PTSD (post-traumatic stress disorder)    Right lower quadrant abdominal pain 02/13/2018   Tachycardia     Past Surgical History:  Procedure Laterality Date   ABDOMINAL HYSTERECTOMY     ADENOIDECTOMY     CHOLECYSTECTOMY  2009   CYSTOSCOPY N/A 07/31/2018   Procedure: CYSTOSCOPY;  Surgeon: Benjaman Kindler, MD;  Location: [facility] ORS;  Service: Gynecology;  Laterality: N/A;   LAPAROSCOPIC BILATERAL SALPINGECTOMY Bilateral 07/31/2018   Procedure: LAPAROSCOPIC BILATERAL SALPINGECTOMY;  Surgeon: Benjaman Kindler, MD;  Location: [facility] ORS;  Service: Gynecology;  Laterality: Bilateral;   LAPAROSCOPIC HYSTERECTOMY N/A 07/31/2018   Procedure: HYSTERECTOMY TOTAL LAPAROSCOPIC;  Surgeon: Benjaman Kindler, MD;  Location: [facility] ORS;  Service: Gynecology;  Laterality: N/A;   LAPAROSCOPY N/A 06/07/2019   Procedure: LAPAROSCOPY OPERATIVE, WITH PERITONEAL BIOPSIES;  Surgeon: Benjaman Kindler, MD;  Location: [facility] ORS;  Service: Gynecology;  Laterality: N/A;   LYSIS OF ADHESION N/A 07/31/2018   Procedure: LYSIS OF ADHESION;  Surgeon: Benjaman Kindler, MD;  Location: [facility] ORS;  Service: Gynecology;  Laterality: N/A;   OVARY SURGERY Right    cyst removed a while ago   TONSILLECTOMY     tubes in ear     Family History:  Family History  Problem Relation Age of Onset   Asthma Mother    Diabetes Mother    Hyperlipidemia Mother    Hypertension Mother    Diabetes Father    Hyperlipidemia Father    Hypertension Father    Diabetes Brother    Depression Brother    Alcohol abuse Brother    Depression Maternal Grandmother    Stroke Paternal Grandmother    Breast cancer Neg Hx    Family Psychiatric  History: See H & P Social History:  Social History   Substance and Sexual Activity  Alcohol Use No     Social History   Substance and Sexual Activity  Drug Use No    Social History   Socioeconomic History   Marital status: Married    Spouse name: chip   Number of children: 0   Years of education: Not on  file   Highest education level: High school graduate  Occupational History   Occupation: day care worker  Tobacco Use   Smoking status: Never   Smokeless tobacco: Never  Vaping Use   Vaping Use: Never used  Substance and Sexual Activity   Alcohol use: No   Drug use: No   Sexual activity: Not on file  Other Topics Concern   Not on file  Social History Narrative   Not on file   Social Determinants of Health   Financial Resource Strain: Low Risk  (04/27/2018)   Overall Financial Resource Strain (CARDIA)    Difficulty of Paying Living Expenses: Not hard at all  Food Insecurity: No Food Insecurity (10/23/2022)   Hunger Vital Sign    Worried About Running Out of Food in the Last Year: Never true    Ran Out of Food in the Last Year: Never true  Transportation Needs: No Transportation Needs (10/23/2022)   PRAPARE - Transportation    Lack of Transportation (Medical): No    Lack  of Transportation (Non-Medical): No  Physical Activity: Inactive (04/27/2018)   Exercise Vital Sign    Days of Exercise per Week: 0 days    Minutes of Exercise per Session: 0 min  Stress: Stress Concern Present (04/27/2018)   [place]    Feeling of Stress : Very much  Social Connections: Unknown (04/27/2018)   Social Connection and Isolation Panel [NHANES]    Frequency of Communication with Friends and Family: Not on file    Frequency of Social Gatherings with Friends and Family: Not on file    Attends Religious Services: More than 4 times per year    Active Member of [employer] or Organizations: No    Attends Music therapist: Never    Marital Status: Married   Sleep: Good  Appetite:  Good  Current Medications: Current Facility-Administered Medications  Medication Dose Route Frequency Provider Last Rate Last Admin   acetaminophen (TYLENOL) tablet 650 mg  650 mg Oral Q6H PRN Onuoha, Chinwendu V, NP   650 mg at 10/23/22 0852    albuterol (VENTOLIN HFA) 108 (90 Base) MCG/ACT inhaler 2 puff  2 puff Inhalation Q6H PRN Nkwenti, Doris, NP       alum & mag hydroxide-simeth (MAALOX/MYLANTA) 200-200-20 MG/5ML suspension 30 mL  30 mL Oral Q4H PRN Onuoha, Chinwendu V, NP       ARIPiprazole (ABILIFY) tablet 5 mg  5 mg Oral Daily Nkwenti, Doris, NP   5 mg at 10/25/22 0755   atorvastatin (LIPITOR) tablet 20 mg  20 mg Oral Daily Hezzie Bump, NP   20 mg at 10/25/22 0755   cloNIDine (CATAPRES) tablet 0.1 mg  0.1 mg Oral BID PRN Nicholes Rough, NP       diphenhydrAMINE (BENADRYL) capsule 50 mg  50 mg Oral TID PRN Hezzie Bump, NP       Or   diphenhydrAMINE (BENADRYL) injection 50 mg  50 mg Intramuscular TID PRN Hezzie Bump, NP       diphenhydrAMINE (BENADRYL) capsule 50 mg  50 mg Oral TID PRN Onuoha, Chinwendu V, NP       Or   diphenhydrAMINE (BENADRYL) injection 50 mg  50 mg Intramuscular TID PRN Onuoha, Chinwendu V, NP       feeding supplement (ENSURE ENLIVE / ENSURE PLUS) liquid 237 mL  237 mL Oral QPC breakfast Massengill, Nathan, MD       haloperidol (HALDOL) tablet 5 mg  5 mg Oral TID PRN Hezzie Bump, NP       Or   haloperidol lactate (HALDOL) injection 5 mg  5 mg Intramuscular TID PRN Hezzie Bump, NP       haloperidol (HALDOL) tablet 5 mg  5 mg Oral TID PRN Onuoha, Chinwendu V, NP       Or   haloperidol lactate (HALDOL) injection 5 mg  5 mg Intramuscular TID PRN Onuoha, Chinwendu V, NP       hydrOXYzine (ATARAX) tablet 25 mg  25 mg Oral TID PRN Onuoha, Chinwendu V, NP       hydrOXYzine (ATARAX) tablet 50 mg  50 mg Oral QHS Nkwenti, Doris, NP   50 mg at 10/24/22 2146   insulin aspart (novoLOG) injection 0-15 Units  0-15 Units Subcutaneous TID WC Hezzie Bump, NP   5 Units at 10/25/22 0717   insulin aspart (novoLOG) injection 0-5 Units  0-5 Units Subcutaneous QHS Hezzie Bump, NP   5 Units at 10/24/22  2146   insulin aspart protamine- aspart (NOVOLOG MIX 70/30) injection 15 Units  15 Units  Subcutaneous BID WC Nicholes Rough, NP   15 Units at 10/25/22 0759   irbesartan (AVAPRO) tablet 75 mg  75 mg Oral Daily Hezzie Bump, NP   75 mg at 10/25/22 0755   LORazepam (ATIVAN) tablet 2 mg  2 mg Oral TID PRN Hezzie Bump, NP       Or   LORazepam (ATIVAN) injection 2 mg  2 mg Intramuscular TID PRN Hezzie Bump, NP       LORazepam (ATIVAN) tablet 2 mg  2 mg Oral TID PRN Onuoha, Chinwendu V, NP       Or   LORazepam (ATIVAN) injection 2 mg  2 mg Intramuscular TID PRN Onuoha, Chinwendu V, NP       magnesium hydroxide (MILK OF MAGNESIA) suspension 30 mL  30 mL Oral Daily PRN Onuoha, Chinwendu V, NP       propranolol (INDERAL) tablet 10 mg  10 mg Oral BID Nicholes Rough, NP   10 mg at 10/25/22 0755   sertraline (ZOLOFT) tablet 25 mg  25 mg Oral Once Nicholes Rough, NP        Lab Results:  Results for orders placed or performed during the hospital encounter of 10/23/22 (from the past 48 hour(s))  Glucose, capillary     Status: Abnormal   Collection Time: 10/23/22  4:58 PM  Result Value Ref Range   Glucose-Capillary 288 (H) 70 - 99 mg/dL    Comment: Glucose reference range applies only to samples taken after fasting for at least 8 hours.   Comment 1 Notify RN    Comment 2 Document in Chart   Glucose, capillary     Status: Abnormal   Collection Time: 10/23/22  9:09 PM  Result Value Ref Range   Glucose-Capillary 335 (H) 70 - 99 mg/dL    Comment: Glucose reference range applies only to samples taken after fasting for at least 8 hours.   Comment 1 Notify RN    Comment 2 Document in Chart   Glucose, capillary     Status: Abnormal   Collection Time: 10/24/22  5:51 AM  Result Value Ref Range   Glucose-Capillary 156 (H) 70 - 99 mg/dL    Comment: Glucose reference range applies only to samples taken after fasting for at least 8 hours.   Comment 1 Notify RN    Comment 2 Document in Chart   Glucose, capillary     Status: Abnormal   Collection Time: 10/24/22 12:12 PM  Result  Value Ref Range   Glucose-Capillary 258 (H) 70 - 99 mg/dL    Comment: Glucose reference range applies only to samples taken after fasting for at least 8 hours.  Glucose, capillary     Status: Abnormal   Collection Time: 10/24/22  5:26 PM  Result Value Ref Range   Glucose-Capillary 283 (H) 70 - 99 mg/dL    Comment: Glucose reference range applies only to samples taken after fasting for at least 8 hours.  Glucose, capillary     Status: Abnormal   Collection Time: 10/24/22  8:38 PM  Result Value Ref Range   Glucose-Capillary 362 (H) 70 - 99 mg/dL    Comment: Glucose reference range applies only to samples taken after fasting for at least 8 hours.   Comment 1 Notify RN    Comment 2 Document in Chart   Glucose, capillary     Status: Abnormal  Collection Time: 10/25/22  5:48 AM  Result Value Ref Range   Glucose-Capillary 208 (H) 70 - 99 mg/dL    Comment: Glucose reference range applies only to samples taken after fasting for at least 8 hours.   Comment 1 Notify RN    Comment 2 Document in Chart     Blood Alcohol level:  Lab Results  Component Value Date   ETH <10 10/22/2022   ETH <10 [id number]    Metabolic Disorder Labs: Lab Results  Component Value Date   HGBA1C 12.6 (H) 10/22/2022   MPG 315 10/22/2022   MPG 298 01/12/2021   Lab Results  Component Value Date   PROLACTIN 11.1 10/22/2022   PROLACTIN 14.5 05/05/2015   Lab Results  Component Value Date   CHOL 247 (H) 10/22/2022   TRIG 297 (H) 10/22/2022   HDL 65 10/22/2022   CHOLHDL 3.8 10/22/2022   VLDL 59 (H) 10/22/2022   LDLCALC 123 (H) 10/22/2022   [place]  09/03/2019     Comment:     . LDL cholesterol not calculated. Triglyceride levels greater than 400 mg/dL invalidate calculated LDL results. . Reference range: <100 . Desirable range <100 mg/dL for primary prevention;   <70 mg/dL for patients with CHD or diabetic patients  with > or = 2 CHD risk factors. Marland Kitchen LDL-C is now calculated using the Martin-Hopkins   calculation, which is a validated novel method providing  better accuracy than the Friedewald equation in the  estimation of LDL-C.  Cresenciano Genre et al. Annamaria Helling. [id number]): 2061-2068  (http://education.[employer].com/faq/FAQ164)     Physical Findings: AIMS:  , ,  ,  ,    CIWA:    COWS:     Musculoskeletal: Strength & Muscle Tone: within normal limits Gait & Station: normal Patient leans: N/A  Psychiatric Specialty Exam:  Presentation  General Appearance:  Appropriate for Environment; Fairly Groomed  Eye Contact: Good  Speech: Clear and Coherent  Speech Volume: Normal  Handedness: Right   Mood and Affect  Mood: Depressed; Anxious  Affect: Congruent   Thought Process  Thought Processes: Coherent  Descriptions of Associations:Intact  Orientation:Full (Time, Place and Person)  Thought Content:Logical  History of Schizophrenia/Schizoaffective disorder:No data recorded Duration of Psychotic Symptoms:No data recorded Hallucinations:Hallucinations: None  Ideas of Reference:None  Suicidal Thoughts:Suicidal Thoughts: No  Homicidal Thoughts:Homicidal Thoughts: No   Sensorium  Memory: Immediate Good  Judgment: Fair  Insight: Fair   Community education officer  Concentration: Good  Attention Span: Good  Recall: Good  Fund of Knowledge: Good  Language: Good   Psychomotor Activity  Psychomotor Activity: Psychomotor Activity: Normal   Assets  Assets: Resilience   Sleep  Sleep: Sleep: Good    Physical Exam: Physical Exam Vitals and nursing note reviewed.  Constitutional:      Appearance: Normal appearance.  HENT:     Nose: Nose normal. No congestion or rhinorrhea.  Eyes:     Pupils: Pupils are equal, round, and reactive to light.  Pulmonary:     Effort: Pulmonary effort is normal.  Musculoskeletal:     Cervical back: Normal range of motion.  Neurological:     Mental Status: She is alert and oriented to person,  place, and time.  Psychiatric:        Behavior: Behavior normal.   Review of Systems  Constitutional: Negative.   HENT: Negative.    Eyes: Negative.   Respiratory: Negative.    Cardiovascular: Negative.   Gastrointestinal: Negative.   Genitourinary: Negative.   Musculoskeletal:  Negative.   Skin: Negative.   Neurological: Negative.  Negative for dizziness.  Psychiatric/Behavioral:  Negative for hallucinations, memory loss, substance abuse and suicidal ideas.   All other systems reviewed and are negative.  Blood pressure 139/89, pulse (!) 113, temperature (!) 97.5 F (36.4 C), temperature source Oral, resp. rate 18, height 5\' 6"  (1.676 m), last menstrual period 07/24/2018, SpO2 100 %. Body mass index is 45.03 kg/m.  Treatment Plan Summary: Daily contact with patient to assess and evaluate symptoms and progress in treatment and Medication management   Observation Level/Precautions:  15 minute checks  Laboratory:  Labs reviewed   Psychotherapy:  Unit Group sessions  Medications:  See [facility]  Consultations:  To be determined   Discharge Concerns:  Safety, medication compliance, mood stability  Estimated LOS: 5-7 days  Other:  N/A    Labs reviewed: Blood sugar elevated in the 200s today, but pt states that she ate a brownie during lunch and seems not to be compliant with her nutritional recommendations. This will be reiterated to her.   PLAN Safety and Monitoring: Voluntary admission to inpatient psychiatric unit for safety, stabilization and treatment Daily contact with patient to assess and evaluate symptoms and progress in treatment Patient's case to be discussed in multi-disciplinary team meeting Observation Level : q15 minute checks Vital signs: q12 hours Precautions: Safety   Long Term Goal(s): Improvement in symptoms so as ready for discharge   Short Term Goals: Ability to identify changes in lifestyle to reduce recurrence of condition will improve, Ability to disclose  and discuss suicidal ideas, Ability to demonstrate self-control will improve, Ability to identify and develop effective coping behaviors will improve, Ability to maintain clinical measurements within normal limits will improve, Compliance with prescribed medications will improve, and Ability to identify triggers associated with substance abuse/mental health issues will improve   Diagnoses:  Principal Problem:   Bipolar 2 disorder, major depressive episode ([place]) Active Problems:   PTSD (post-traumatic stress disorder)   Uncontrolled type 2 diabetes mellitus with hyperglycemia ([place])   Essential hypertension   Hyperlipidemia   Asthma   GAD (generalized anxiety disorder)   Medications -Continue Zoloft 50 mg daily for depression and anxiety, consider titrating to 100 mg daily in the next few days if needed. -Continue clonidine 0.1 mg BID PRN for SBP >160 and DBP >100. -Continue Abilify 5 mg daily for depressive symptoms -Continue hydroxyzine 50 mg nightly for sleep -Continue Inderal 10 mg twice daily for tachycardia/anxiety -Continue hydroxyzine 25 mg 3 times daily as needed for anxiety -Continue Avapro 75 mg daily for hypertension -Continue insulin NovoLog 0 to 5 units, and 0 to 15 units as per sliding scale for diabetes (please see the MAR for complete orders) DM consult placed -Continue insulin 70/30 15 units twice daily with meals as well nutritional consultant's recommendations -Continue albuterol inhaler as needed for shortness of breath or wheezing -Continue Lipitor 20 mg daily for hyperlipidemia   Other PRNS -Continue Tylenol 650 mg every 6 hours PRN for mild pain -Continue Maalox 30 mg every 4 hrs PRN for indigestion -Continue Imodium 2-4 mg as needed for diarrhea -Continue Milk of Magnesia as needed every 6 hrs for constipation -Continue Zofran disintegrating tabs every 6 hrs PRN for nausea   Discharge Planning: Social work and case management to assist with discharge planning  and identification of hospital follow-up needs prior to discharge Estimated LOS: 5-7 days Discharge Concerns: Need to establish a safety plan; Medication compliance and effectiveness Discharge Goals: Return home  with outpatient referrals for mental health follow-up including medication management/psychotherapy   I certify that inpatient services furnished can reasonably be expected to improve the patient's condition.    Jarid Sasso Winfred Leeds, MD 10/25/2022, 12:02 PM

## 2022-10-25 NOTE — Inpatient Diabetes Management (Signed)
Inpatient Diabetes Program Recommendations  AACE/ADA: New Consensus Statement on Inpatient Glycemic Control   Target Ranges:  Prepandial:   less than 140 mg/dL      Peak postprandial:   less than 180 mg/dL (1-2 hours)      Critically ill patients:  140 - 180 mg/dL    Latest Reference Range & Units 10/24/22 05:51 10/24/22 12:12 10/24/22 17:26 10/24/22 20:38  Glucose-Capillary 70 - 99 mg/dL 156 (H) 258 (H) 283 (H) 362 (H)   Review of Glycemic Control  Diabetes history: DM2 Outpatient Diabetes medications: 70/30 50 units BID Current orders for Inpatient glycemic control: 70/30 15 units BID, Novolog 0-15 units TID with meals, Novolog 0-5 units QHS  Inpatient Diabetes Program Recommendations:    Insulin: Please consider increasing 70/30 to 20 units BID.  Thanks, Barnie Alderman, RN, MSN, [place] Diabetes Coordinator Inpatient Diabetes Program [phone] (Team Pager from 8am to [place])

## 2022-10-25 NOTE — Plan of Care (Signed)
  Problem: Safety: Goal: Periods of time without injury will increase Outcome: Progressing   

## 2022-10-25 NOTE — Progress Notes (Signed)
Pt medication compliant, attended group. Pt reports she has a good support system at home and says her husband plans to hold her accountable to remain compliant with medications post discharge. Pt denies SI/HI/self harm thoughts as well as a/v hallucinations today. Q 15 minute checks ongoing for safety.

## 2022-10-25 NOTE — Progress Notes (Signed)
Adult Psychoeducational Group Note  Date:  10/25/2022 Time:  5:16 PM  Group Topic/Focus:  Goals Group:   The focus of this group is to help patients establish daily goals to achieve during treatment and discuss how the patient can incorporate goal setting into their daily lives to aide in recovery. Orientation:   The focus of this group is to educate the patient on the purpose and policies of crisis stabilization and provide a format to answer questions about their admission.  The group details unit policies and expectations of patients while admitted.  Participation Level:  Active  Participation Quality:  Appropriate  Affect:  Appropriate  Cognitive:  Appropriate  Insight: Appropriate  Engagement in Group:  Engaged  Modes of Intervention:  Discussion  Additional Comments:  Pt attended the orientation/goals group and remained appropriate and engaged throughout the duration of the group.   Beryle Beams 10/25/2022, 5:16 PM

## 2022-10-26 DIAGNOSIS — F3181 Bipolar II disorder: Secondary | ICD-10-CM

## 2022-10-26 LAB — GLUCOSE, CAPILLARY
Glucose-Capillary: 232 mg/dL — ABNORMAL HIGH (ref 70–99)
Glucose-Capillary: 363 mg/dL — ABNORMAL HIGH (ref 70–99)
Glucose-Capillary: 230 mg/dL — ABNORMAL HIGH (ref 70–99)
Glucose-Capillary: 307 mg/dL — ABNORMAL HIGH (ref 70–99)

## 2022-10-26 NOTE — [facility] Suicide Risk Assessment (Signed)
[place] INPATIENT:  Family/Significant Other Suicide Prevention Education  Suicide Prevention Education:  Education Completed; Ray, "[name]" [phone],  (name of family member/significant other) has been identified by the patient as the family member/significant other with whom the patient will be residing, and identified as the person(s) who will aid the patient in the event of a mental health crisis (suicidal ideations/suicide attempt).  With written consent from the patient, the family member/significant other has been provided the following suicide prevention education, prior to the and/or following the discharge of the patient.  CSW spoke with [name] and completed safety planning. [name] states that he had no questions or concerns and felt that patient has received all the necessary help. CSW did go over patient follow up appointments and [name] said he will make sure that patient goes to them. Also, chip confirmed that their were no guns or weapons in the home.   The suicide prevention education provided includes the following: Suicide risk factors Suicide prevention and interventions National Suicide Hotline telephone number [facility] assessment telephone number [facility] Emergency Assistance [place] and/or Residential Mobile Crisis Unit telephone number  Request made of family/significant other to: Remove weapons (e.g., guns, rifles, knives), all items previously/currently identified as safety concern.   Remove drugs/medications (over-the-counter, prescriptions, illicit drugs), all items previously/currently identified as a safety concern.  The family member/significant other verbalizes understanding of the suicide prevention education information provided.  The family member/significant other agrees to remove the items of safety concern listed above.  Sherre Lain 10/26/2022, 11:17 AM

## 2022-10-26 NOTE — Progress Notes (Signed)
Pt on unit, compliant with medications and attending group. Pt reports improving depressive symptoms; denies SI/HI/self harm thoughts as well as a/v hallucinations. Q 15 minute checks ongoing.

## 2022-10-26 NOTE — Group Note (Signed)
Date:  10/26/2022 Time:  2:50 PM  Group Topic/Focus:  Goals Group:   The focus of this group is to help patients establish daily goals to achieve during treatment and discuss how the patient can incorporate goal setting into their daily lives to aide in recovery. Orientation:   The focus of this group is to educate the patient on the purpose and policies of crisis stabilization and provide a format to answer questions about their admission.  The group details unit policies and expectations of patients while admitted.    Participation Level:  Active  Participation Quality:  Attentive  Affect:  Appropriate  Cognitive:  Appropriate  Insight: Appropriate  Engagement in Group:  Engaged  Modes of Intervention:  Discussion  Additional Comments:  Patient attended group and was attentive the duration of it.   Perri Aragones T Stylianos Stradling 10/26/2022, 2:50 PM

## 2022-10-26 NOTE — [facility] Group Notes (Signed)
[place] Group Notes:  (Nursing/MHT/Case Management/Adjunct)  Date:  10/26/2022  Time:  9:40 PM  Type of Therapy:  Group Therapy  Participation Level:  Active  Participation Quality:  Appropriate  Affect:  Appropriate  Cognitive:  Appropriate  Insight:  Appropriate  Engagement in Group:  Engaged  Modes of Intervention:  Education  Summary of Progress/Problems: Goal to go home. Day 10/10.  Orvan Falconer 10/26/2022, 9:40 PM

## 2022-10-26 NOTE — Progress Notes (Signed)
[facility] MD Progress Note  10/26/2022 1:52 PM Joan [name]  MRN:  [id number] Principal Problem: Bipolar 2 disorder, major depressive episode ([place]) Diagnosis: Principal Problem:   Bipolar 2 disorder, major depressive episode ([place]) Active Problems:   PTSD (post-traumatic stress disorder)   Uncontrolled type 2 diabetes mellitus with hyperglycemia ([place])   Essential hypertension   Hyperlipidemia   Asthma   GAD (generalized anxiety disorder)  Reason for Admission: Joan [name] is a 42 yo Caucasian female with past psychiatric history of bipolar disorder, PTSD and GAD who presented to [place] 10/22/22 after driving herself with complaints of worsening depressive symptoms and suicidal ideations with a plan to drive herself off an embankment or into a tree to end her life. She reported stopping her medications x6 months ago and recently stopped taking her insulin with hopes that the elevated blood glucose would kill her. Pt was transferred to the [facility] ER for elevated blood glucose and medical clearance prior to being transferred voluntarily to this [facility] for treatment and stabilization of her mental status.   24-hour chart review: Vital signs remain slightly elevated; DBP 123/90, HR 110.  Propanolol 10 mg BID started yesterday. Patient remains compliant with scheduled medications; PRN medications received in the past 24 hours has been Tylenol.  Staff report patient is regularly attending unit group sessions and appropriately interacting with peers.  Patient assessment note,  Patient observed intermittently in milieu participating in unit activities and groups then in room laying in bed where she presents alert and oriented. Calm and cooperative. Mood euthymic, congruent affect. Upon evaluation today patient reports having a good day yesterday and today with 'better mood feeling less depressed'. States she lives in [place] with her husband and began having suicidal ideations with command auditory  hallucinations that she feels were triggered by stress from school, recently triggered trauma from psychology class, and poor sleep. States last time experiencing auditory hallucinations was Tuesday.  She endorses improved sleep and appetite since admission; reports poor sleep prior to admission only receiving 30 minutes to 1.5 hours. She reports improved mood rating her depression at 2/10, anxiety 1/10 with 10 being the worse. She denies any active SI/HI/AVH and states plan to return home to husband. Thought content appears linear and logical with no delusional thoughts noted. She reports social work contacted her husband today for safety planning with plan for discharge tomorrow.    Per [facility] Suicide Risk Assessment 10/26/22 1117 'Education Completed; Jeanell Sparrow, "[name]" [phone],  (name of family member/significant other) has been identified by the patient as the family member/significant other with whom the patient will be residing, and identified as the person(s) who will aid the patient in the event of a mental health crisis (suicidal ideations/suicide attempt).  With written consent from the patient, the family member/significant other has been provided the following suicide prevention education, prior to the and/or following the discharge of the patient.   CSW spoke with [name] and completed safety planning. [name] states that he had no questions or concerns and felt that patient has received all the necessary help. CSW did go over patient follow up appointments and [name] said he will make sure that patient goes to them. Also, chip confirmed that their were no guns or weapons in the home.    The suicide prevention education provided includes the following: Suicide risk factors Suicide prevention and interventions National Suicide Hotline telephone number [facility] assessment telephone number [facility] Emergency [facility] and/or  Residential Mobile Crisis Unit  telephone number   Request made of family/significant other to: Remove weapons (e.g., guns, rifles, knives), all items previously/currently identified as safety concern.   Remove drugs/medications (over-the-counter, prescriptions, illicit drugs), all items previously/currently identified as a safety concern.   The family member/significant other verbalizes understanding of the suicide prevention education information provided.  The family member/significant other agrees to remove the items of safety concern listed above.'  Total Time spent with patient: 35 minutes  Past Psychiatric History: See H & P  Past Medical History:  Past Medical History:  Diagnosis Date   Acute asthma exacerbation 04/10/2018   Acute non-recurrent maxillary sinusitis 09/25/2018   Asthma    Asthma exacerbation 08/03/2020   Auditory hallucinations    only after anesthesia   BV (bacterial vaginosis) 02/13/2018   COVID-19 virus infection 08/16/2019   Diabetes mellitus without complication ([place])    diet controlled   Diabetes mellitus, type II ([place])    insulin, jardiance   Dyspnea    with exertion   Essential hypertension    Essential hypertension 11/24/2017   GERD (gastroesophageal reflux disease)    occasionally-NO MEDS   History of placement of ear tubes 05/20/2018   Hyperlipidemia    Kidney stone 02/11/2019   Localized skin mass, lump, or swelling 01/26/2021   MDD (major depressive disorder)    Miscarriage    Nausea & vomiting 10/05/2019   PTSD (post-traumatic stress disorder)    Right lower quadrant abdominal pain 02/13/2018   Tachycardia     Past Surgical History:  Procedure Laterality Date   ABDOMINAL HYSTERECTOMY     ADENOIDECTOMY     CHOLECYSTECTOMY  2009   CYSTOSCOPY N/A 07/31/2018   Procedure: CYSTOSCOPY;  Surgeon: Benjaman Kindler, MD;  Location: [facility] ORS;  Service: Gynecology;  Laterality: N/A;   LAPAROSCOPIC BILATERAL SALPINGECTOMY Bilateral 07/31/2018   Procedure: LAPAROSCOPIC  BILATERAL SALPINGECTOMY;  Surgeon: Benjaman Kindler, MD;  Location: [facility] ORS;  Service: Gynecology;  Laterality: Bilateral;   LAPAROSCOPIC HYSTERECTOMY N/A 07/31/2018   Procedure: HYSTERECTOMY TOTAL LAPAROSCOPIC;  Surgeon: Benjaman Kindler, MD;  Location: [facility] ORS;  Service: Gynecology;  Laterality: N/A;   LAPAROSCOPY N/A 06/07/2019   Procedure: LAPAROSCOPY OPERATIVE, WITH PERITONEAL BIOPSIES;  Surgeon: Benjaman Kindler, MD;  Location: [facility] ORS;  Service: Gynecology;  Laterality: N/A;   LYSIS OF ADHESION N/A 07/31/2018   Procedure: LYSIS OF ADHESION;  Surgeon: Benjaman Kindler, MD;  Location: [facility] ORS;  Service: Gynecology;  Laterality: N/A;   OVARY SURGERY Right    cyst removed a while ago   TONSILLECTOMY     tubes in ear     Family History:  Family History  Problem Relation Age of Onset   Asthma Mother    Diabetes Mother    Hyperlipidemia Mother    Hypertension Mother    Diabetes Father    Hyperlipidemia Father    Hypertension Father    Diabetes Brother    Depression Brother    Alcohol abuse Brother    Depression Maternal Grandmother    Stroke Paternal Grandmother    Breast cancer Neg Hx    Family Psychiatric  History: See H & P Social History:  Social History   Substance and Sexual Activity  Alcohol Use No     Social History   Substance and Sexual Activity  Drug Use No    Social History   Socioeconomic History   Marital status: Married    Spouse name: chip   Number of children: 0  Years of education: Not on file   Highest education level: High school graduate  Occupational History   Occupation: day care worker  Tobacco Use   Smoking status: Never   Smokeless tobacco: Never  Vaping Use   Vaping Use: Never used  Substance and Sexual Activity   Alcohol use: No   Drug use: No   Sexual activity: Not on file  Other Topics Concern   Not on file  Social History Narrative   Not on file   Social Determinants of Health   Financial Resource Strain: Low Risk   (04/27/2018)   Overall Financial Resource Strain (CARDIA)    Difficulty of Paying Living Expenses: Not hard at all  Food Insecurity: No Food Insecurity (10/23/2022)   Hunger Vital Sign    Worried About Running Out of Food in the Last Year: Never true    Ran Out of Food in the Last Year: Never true  Transportation Needs: No Transportation Needs (10/23/2022)   PRAPARE - Hydrologist (Medical): No    Lack of Transportation (Non-Medical): No  Physical Activity: Inactive (04/27/2018)   Exercise Vital Sign    Days of Exercise per Week: 0 days    Minutes of Exercise per Session: 0 min  Stress: Stress Concern Present (04/27/2018)   [place]    Feeling of Stress : Very much  Social Connections: Unknown (04/27/2018)   Social Connection and Isolation Panel [NHANES]    Frequency of Communication with Friends and Family: Not on file    Frequency of Social Gatherings with Friends and Family: Not on file    Attends Religious Services: More than 4 times per year    Active Member of [employer] or Organizations: No    Attends Archivist Meetings: Never    Marital Status: Married   Sleep: Good  Appetite:  Good  Current Medications: Current Facility-Administered Medications  Medication Dose Route Frequency Provider Last Rate Last Admin   acetaminophen (TYLENOL) tablet 650 mg  650 mg Oral Q6H PRN Onuoha, Chinwendu V, NP   650 mg at 10/26/22 0800   albuterol (VENTOLIN HFA) 108 (90 Base) MCG/ACT inhaler 2 puff  2 puff Inhalation Q6H PRN Nkwenti, Doris, NP       alum & mag hydroxide-simeth (MAALOX/MYLANTA) 200-200-20 MG/5ML suspension 30 mL  30 mL Oral Q4H PRN Onuoha, Chinwendu V, NP       ARIPiprazole (ABILIFY) tablet 5 mg  5 mg Oral Daily Nkwenti, Doris, NP   5 mg at 10/26/22 0757   atorvastatin (LIPITOR) tablet 20 mg  20 mg Oral Daily Hezzie Bump, NP   20 mg at 10/26/22 0757   cloNIDine (CATAPRES)  tablet 0.1 mg  0.1 mg Oral BID PRN Nicholes Rough, NP       diphenhydrAMINE (BENADRYL) capsule 50 mg  50 mg Oral TID PRN Hezzie Bump, NP       Or   diphenhydrAMINE (BENADRYL) injection 50 mg  50 mg Intramuscular TID PRN Hezzie Bump, NP       diphenhydrAMINE (BENADRYL) capsule 50 mg  50 mg Oral TID PRN Onuoha, Chinwendu V, NP       Or   diphenhydrAMINE (BENADRYL) injection 50 mg  50 mg Intramuscular TID PRN Onuoha, Chinwendu V, NP       feeding supplement (ENSURE ENLIVE / ENSURE PLUS) liquid 237 mL  237 mL Oral QPC breakfast Massengill, Ovid Curd, MD  haloperidol (HALDOL) tablet 5 mg  5 mg Oral TID PRN Hezzie Bump, NP       Or   haloperidol lactate (HALDOL) injection 5 mg  5 mg Intramuscular TID PRN Hezzie Bump, NP       haloperidol (HALDOL) tablet 5 mg  5 mg Oral TID PRN Onuoha, Chinwendu V, NP       Or   haloperidol lactate (HALDOL) injection 5 mg  5 mg Intramuscular TID PRN Onuoha, Chinwendu V, NP       hydrOXYzine (ATARAX) tablet 25 mg  25 mg Oral TID PRN Onuoha, Chinwendu V, NP       hydrOXYzine (ATARAX) tablet 50 mg  50 mg Oral QHS Nkwenti, Doris, NP   50 mg at 10/25/22 2126   insulin aspart (novoLOG) injection 0-15 Units  0-15 Units Subcutaneous TID WC Hezzie Bump, NP   15 Units at 10/26/22 1200   insulin aspart (novoLOG) injection 0-5 Units  0-5 Units Subcutaneous QHS Hezzie Bump, NP   5 Units at 10/25/22 2126   insulin aspart protamine- aspart (NOVOLOG MIX 70/30) injection 15 Units  15 Units Subcutaneous BID WC Nicholes Rough, NP   15 Units at 10/26/22 0758   irbesartan (AVAPRO) tablet 75 mg  75 mg Oral Daily Hezzie Bump, NP   75 mg at 10/26/22 0757   LORazepam (ATIVAN) tablet 2 mg  2 mg Oral TID PRN Hezzie Bump, NP       Or   LORazepam (ATIVAN) injection 2 mg  2 mg Intramuscular TID PRN Hezzie Bump, NP       LORazepam (ATIVAN) tablet 2 mg  2 mg Oral TID PRN Onuoha, Chinwendu V, NP       Or   LORazepam (ATIVAN) injection 2 mg  2  mg Intramuscular TID PRN Onuoha, Chinwendu V, NP       magnesium hydroxide (MILK OF MAGNESIA) suspension 30 mL  30 mL Oral Daily PRN Onuoha, Chinwendu V, NP       propranolol (INDERAL) tablet 10 mg  10 mg Oral BID Nicholes Rough, NP   10 mg at 10/26/22 0757   sertraline (ZOLOFT) tablet 50 mg  50 mg Oral Daily Attiah, Nadir, MD   50 mg at 10/26/22 0757    Lab Results:  Results for orders placed or performed during the hospital encounter of 10/23/22 (from the past 48 hour(s))  Glucose, capillary     Status: Abnormal   Collection Time: 10/24/22  5:26 PM  Result Value Ref Range   Glucose-Capillary 283 (H) 70 - 99 mg/dL    Comment: Glucose reference range applies only to samples taken after fasting for at least 8 hours.  Glucose, capillary     Status: Abnormal   Collection Time: 10/24/22  8:38 PM  Result Value Ref Range   Glucose-Capillary 362 (H) 70 - 99 mg/dL    Comment: Glucose reference range applies only to samples taken after fasting for at least 8 hours.   Comment 1 Notify RN    Comment 2 Document in Chart   Glucose, capillary     Status: Abnormal   Collection Time: 10/25/22  5:48 AM  Result Value Ref Range   Glucose-Capillary 208 (H) 70 - 99 mg/dL    Comment: Glucose reference range applies only to samples taken after fasting for at least 8 hours.   Comment 1 Notify RN    Comment 2 Document in Chart   Glucose, capillary  Status: Abnormal   Collection Time: 10/25/22 12:09 PM  Result Value Ref Range   Glucose-Capillary 288 (H) 70 - 99 mg/dL    Comment: Glucose reference range applies only to samples taken after fasting for at least 8 hours.  Glucose, capillary     Status: Abnormal   Collection Time: 10/25/22  4:57 PM  Result Value Ref Range   Glucose-Capillary 328 (H) 70 - 99 mg/dL    Comment: Glucose reference range applies only to samples taken after fasting for at least 8 hours.  Glucose, capillary     Status: Abnormal   Collection Time: 10/25/22  9:19 PM  Result Value  Ref Range   Glucose-Capillary 364 (H) 70 - 99 mg/dL    Comment: Glucose reference range applies only to samples taken after fasting for at least 8 hours.  Glucose, capillary     Status: Abnormal   Collection Time: 10/26/22  6:06 AM  Result Value Ref Range   Glucose-Capillary 232 (H) 70 - 99 mg/dL    Comment: Glucose reference range applies only to samples taken after fasting for at least 8 hours.  Glucose, capillary     Status: Abnormal   Collection Time: 10/26/22 12:00 PM  Result Value Ref Range   Glucose-Capillary 363 (H) 70 - 99 mg/dL    Comment: Glucose reference range applies only to samples taken after fasting for at least 8 hours.    Blood Alcohol level:  Lab Results  Component Value Date   ETH <10 10/22/2022   ETH <10 [id number]    Metabolic Disorder Labs: Lab Results  Component Value Date   HGBA1C 12.6 (H) 10/22/2022   MPG 315 10/22/2022   MPG 298 01/12/2021   Lab Results  Component Value Date   PROLACTIN 11.1 10/22/2022   PROLACTIN 14.5 05/05/2015   Lab Results  Component Value Date   CHOL 247 (H) 10/22/2022   TRIG 297 (H) 10/22/2022   HDL 65 10/22/2022   CHOLHDL 3.8 10/22/2022   VLDL 59 (H) 10/22/2022   LDLCALC 123 (H) 10/22/2022   [place]  09/03/2019     Comment:     . LDL cholesterol not calculated. Triglyceride levels greater than 400 mg/dL invalidate calculated LDL results. . Reference range: <100 . Desirable range <100 mg/dL for primary prevention;   <70 mg/dL for patients with CHD or diabetic patients  with > or = 2 CHD risk factors. Marland Kitchen LDL-C is now calculated using the Martin-Hopkins  calculation, which is a validated novel method providing  better accuracy than the Friedewald equation in the  estimation of LDL-C.  Cresenciano Genre et al. Annamaria Helling. [id number]): 2061-2068  (http://education.[employer].com/faq/FAQ164)     Physical Findings: AIMS:  , ,  ,  ,    CIWA:    COWS:     Musculoskeletal: Strength & Muscle Tone: within normal  limits Gait & Station: normal Patient leans: N/A  Psychiatric Specialty Exam:  Presentation  General Appearance:  Appropriate for Environment; Fairly Groomed  Eye Contact: Good  Speech: Clear and Coherent  Speech Volume: Normal  Handedness: Right  Mood and Affect  Mood: Euthymic  Affect: Congruent  Thought Process  Thought Processes: Linear  Descriptions of Associations:Intact  Orientation:Full (Time, Place and Person)  Thought Content:Logical  History of Schizophrenia/Schizoaffective disorder:No data recorded Duration of Psychotic Symptoms:No data recorded Hallucinations:Hallucinations: None (no voices x3 days)  Ideas of Reference:None  Suicidal Thoughts:Suicidal Thoughts: No  Homicidal Thoughts:Homicidal Thoughts: No  Sensorium  Memory: Immediate Good; Recent Good  Judgment:  Fair  Insight: Fair  Materials engineer: Good  Attention Span: Good  Recall: Roel Cluck of Knowledge: Good  Language: Good  Psychomotor Activity  Psychomotor Activity: Psychomotor Activity: Normal  Assets  Assets: Resilience; Social Support; Physical Health; Housing; Catering manager; Desire for Improvement; Communication Skills; Intimacy; Vocational/Educational  Sleep  Sleep: Sleep: Good  Physical Exam: Physical Exam Vitals and nursing note reviewed.  Constitutional:      Appearance: Normal appearance.  HENT:     Nose: Nose normal. No congestion or rhinorrhea.  Eyes:     Pupils: Pupils are equal, round, and reactive to light.  Pulmonary:     Effort: Pulmonary effort is normal.  Musculoskeletal:     Cervical back: Normal range of motion.  Neurological:     Mental Status: She is alert and oriented to person, place, and time.  Psychiatric:        Attention and Perception: Attention and perception normal. She is attentive. She does not perceive auditory or visual hallucinations.        Mood and Affect: Mood and  affect normal.        Behavior: Behavior normal.        Thought Content: Thought content is not paranoid or delusional. Thought content does not include homicidal or suicidal ideation. Thought content does not include homicidal or suicidal plan.        Cognition and Memory: Cognition and memory normal.    Review of Systems  Constitutional: Negative.   HENT: Negative.    Eyes: Negative.   Respiratory: Negative.    Cardiovascular: Negative.   Gastrointestinal: Negative.   Genitourinary: Negative.   Musculoskeletal: Negative.   Skin: Negative.   Neurological: Negative.  Negative for dizziness.  Psychiatric/Behavioral:  Negative for hallucinations, memory loss, substance abuse and suicidal ideas.   All other systems reviewed and are negative.  Blood pressure (!) 123/90, pulse (!) 110, temperature (!) 97.5 F (36.4 C), resp. rate 20, height 5\' 6"  (1.676 m), last menstrual period 07/24/2018, SpO2 99 %. Body mass index is 45.03 kg/m.  Treatment Plan Summary: Daily contact with patient to assess and evaluate symptoms and progress in treatment and Medication management   Observation Level/Precautions:  15 minute checks  Laboratory:  Labs reviewed   Psychotherapy:  Unit Group sessions  Medications:  See [facility]  Consultations:  To be determined   Discharge Concerns:  Safety, medication compliance, mood stability  Estimated LOS: 5-7 days  Other:  N/A    Labs reviewed: Blood sugar elevated in the 200s today, but pt states that she ate a brownie during lunch and seems not to be compliant with her nutritional recommendations. This will be reiterated to her.   PLAN Safety and Monitoring: Voluntary admission to inpatient psychiatric unit for safety, stabilization and treatment Daily contact with patient to assess and evaluate symptoms and progress in treatment Patient's case to be discussed in multi-disciplinary team meeting Observation Level : q15 minute checks Vital signs: q12  hours Precautions: Safety   Long Term Goal(s): Improvement in symptoms so as ready for discharge   Short Term Goals: Ability to identify changes in lifestyle to reduce recurrence of condition will improve, Ability to disclose and discuss suicidal ideas, Ability to demonstrate self-control will improve, Ability to identify and develop effective coping behaviors will improve, Ability to maintain clinical measurements within normal limits will improve, Compliance with prescribed medications will improve, and Ability to identify triggers associated with substance abuse/mental health issues will improve  Diagnoses:  Principal Problem:   Bipolar 2 disorder, major depressive episode ([place]) Active Problems:   PTSD (post-traumatic stress disorder)   Uncontrolled type 2 diabetes mellitus with hyperglycemia ([place])   Essential hypertension   Hyperlipidemia   Asthma   GAD (generalized anxiety disorder)   Medications -Continue:  - Zoloft 50 mg daily with plan to increase to 100 mg in the next few days if needed. - Clonidine 0.1 mg BID PRN for SBP >160 and DBP >100. - Abilify 5 mg daily for depressive symptoms - Hydroxyzine 50 mg nightly for sleep - Inderal 10 mg twice daily for tachycardia/anxiety - Avapro 75 mg daily for hypertension - Insulin NovoLog 0 to 5 units, and 0 to 15 units as per sliding scale for diabetes (please see the [facility] for complete orders) DM consult placed - Insulin 70/30 15 units twice daily with meals as well nutritional consultant's recommendations - Albuterol inhaler as needed for shortness of breath or wheezing - Lipitor 20 mg daily for hyperlipidemia   Other PRNS -Continue Tylenol 650 mg every 6 hours PRN for mild pain -Continue Maalox 30 mg every 4 hrs PRN for indigestion -Continue Imodium 2-4 mg as needed for diarrhea -Continue Milk of Magnesia as needed every 6 hrs for constipation -Continue Zofran disintegrating tabs every 6 hrs PRN for nausea -Continue Hydroxyzine  25 mg 3 times daily as needed for anxiety   Discharge Planning: Social work and case management to assist with discharge planning and identification of hospital follow-up needs prior to discharge Estimated LOS: 5-7 days Discharge Concerns: Need to establish a safety plan; Medication compliance and effectiveness Discharge Goals: Return home with outpatient referrals for mental health follow-up including medication management/psychotherapy   I certify that inpatient services furnished can reasonably be expected to improve the patient's condition.    Inda Merlin, NP 10/26/2022, 1:52 PM Patient ID: [id number], female   DOB: May 26, 1981, 42 y.o.   MRN: [id number]

## 2022-10-27 LAB — GLUCOSE, CAPILLARY: Glucose-Capillary: 221 mg/dL — ABNORMAL HIGH (ref 70–99)

## 2022-10-27 MED ORDER — HYDROXYZINE HCL 25 MG PO TABS: 25.0000 mg | ORAL_TABLET | Freq: Three times a day (TID) | ORAL | 0 refills | Status: DC | PRN

## 2022-10-27 MED ORDER — ARIPIPRAZOLE 5 MG PO TABS: 5.0000 mg | ORAL_TABLET | Freq: Every day | ORAL | 0 refills | Status: DC

## 2022-10-27 MED ORDER — CLONIDINE HCL 0.1 MG PO TABS: 0.1000 mg | ORAL_TABLET | Freq: Two times a day (BID) | ORAL | 11 refills | Status: DC | PRN

## 2022-10-27 MED ORDER — PROPRANOLOL HCL 10 MG PO TABS: 10.0000 mg | ORAL_TABLET | Freq: Two times a day (BID) | ORAL | 0 refills | Status: DC

## 2022-10-27 MED ORDER — HYDROXYZINE HCL 50 MG PO TABS: 50.0000 mg | ORAL_TABLET | Freq: Every day | ORAL | 0 refills | Status: DC

## 2022-10-27 MED ORDER — SERTRALINE HCL 50 MG PO TABS: 50.0000 mg | ORAL_TABLET | Freq: Every day | ORAL | 0 refills | Status: DC

## 2022-10-27 NOTE — Progress Notes (Signed)
   10/26/22 2300  Psych Admission Type (Psych Patients Only)  Admission Status Voluntary  Psychosocial Assessment  Patient Complaints None  Eye Contact Fair  Facial Expression Animated  Affect Appropriate to circumstance  Speech Logical/coherent  Interaction Assertive  Motor Activity Slow  Appearance/Hygiene Unremarkable  Behavior Characteristics Cooperative;Appropriate to situation  Mood Pleasant  Thought Process  Coherency WDL  Content WDL  Delusions None reported or observed  Perception WDL  Hallucination None reported or observed  Judgment Poor  Confusion None  Danger to Self  Current suicidal ideation? Denies  Self-Injurious Behavior No self-injurious ideation or behavior indicators observed or expressed   Agreement Not to Harm Self Yes  Description of Agreement verbal  Danger to Others  Danger to Others None reported or observed

## 2022-10-27 NOTE — Progress Notes (Signed)
  [facility] Adult Case Management Discharge Plan :  Will you be returning to the same living situation after discharge:  Yes,  with spouse At discharge, do you have transportation home?: Yes,  husband Do you have the ability to pay for your medications: Yes,  insurance and income  Release of information consent forms completed and emailed to Medical Records, then turned in to Medical Records by CSW.   Patient to Follow up at:  Follow-up [place] [facility] at [place]. Go on 11/11/2022.   Specialty: Behavioral Health Why: You have an appointment medication management services on 11/11/22 at 9:00 am.  This appointment will be held in person.  * You must go to the office prior to 11/08/22 to fill out paperwork, in order to keep your appointment. Contact information: [place] [id number] [place] [facility] Follow up on 11/22/2022.   Specialty: Behavioral Health Why: You have an appointment for therapy services on  11/22/22 at 9:00 am. This will be a Virtual appointment via [facility].  You must be in the Virtual waiting room 5 minutes prior to your appt, or your appt may not be rescheduled.   *  Please give 24 hours notice to cancel/reschedule, or it is considered a 'No show'. Contact information: [place] Follow up.   Why: This agency is available for uninsured patients, if ever needed in the future. Contact information: [address],  [place] [address] Phone: [phone] Fax: [phone]                Next level of care provider has access to [place] and Suicide Prevention discussed: Yes,  with husband     Has patient been referred to the Quitline?: N/A patient is not a  smoker  Patient has been referred for addiction treatment: N/A  Maretta Los, LCSW 10/27/2022, 9:37 AM

## 2022-10-27 NOTE — [facility] Suicide Risk Assessment (Signed)
Suicide Risk Assessment  Admission Assessment    [facility] Admission Suicide Risk Assessment   Nursing information obtained from:  Patient, Review of record Demographic factors:  Caucasian Current Mental Status:  Self-harm thoughts, Self-harm behaviors Loss Factors:  Decline in physical health Historical Factors:  NA Risk Reduction Factors:  Employed, Positive social support  Total Time spent with patient: 30 minutes Principal Problem: Bipolar 2 disorder, major depressive episode ([place]) Diagnosis:  Principal Problem:   Bipolar 2 disorder, major depressive episode ([place]) Active Problems:   PTSD (post-traumatic stress disorder)   Uncontrolled type 2 diabetes mellitus with hyperglycemia ([place])   Essential hypertension   Hyperlipidemia   Asthma   GAD (generalized anxiety disorder)  Subjective Data:   Joan [name] was admitted for Bipolar 2 disorder, major depressive episode ([place]) and crisis management. She was treated with the following medications see MAR.  Joan [name] was discharged with current medication and was instructed on how to take medications as prescribed; (details listed below under Medication List).  Medical problems were identified and treated as needed.  Home medications were restarted as appropriate.  Improvement was monitored by observation and Joan [name] daily report of symptom reduction.  Emotional and mental status was monitored by daily self-inventory reports completed by Joan [name] and clinical staff.         Joan [name] was evaluated by the treatment team for stability and plans for continued recovery upon discharge.  Joan [name] motivation was an integral factor for scheduling further treatment.  Employment, transportation, bed availability, health status, family support, and any pending legal issues were also considered during her hospital stay.  She was offered further treatment options upon discharge including but not limited to Residential, Intensive  Outpatient, and Outpatient treatment.  Joan [name] will follow up with the services as listed below under Follow Up Information.     Upon completion of this admission the Joan [name] was both mentally and medically stable for discharge denying suicidal/homicidal ideation, auditory/visual/tactile hallucinations, delusional thoughts and paranoia.     Continued Clinical Symptoms:  Alcohol Use Disorder Identification Test Final Score (AUDIT): 0 The "Alcohol Use Disorders Identification Test", Guidelines for Use in Primary Care, Second Edition.  World Pharmacologist [facility]). Score between 0-7:  no or low risk or alcohol related problems. Score between 8-15:  moderate risk of alcohol related problems. Score between 16-19:  high risk of alcohol related problems. Score 20 or above:  warrants further diagnostic evaluation for alcohol dependence and treatment.  CLINICAL FACTORS:   Depression:   Recent sense of peace/wellbeing  Musculoskeletal: Strength & Muscle Tone: within normal limits Gait & Station: normal Patient leans: N/A  Psychiatric Specialty Exam:  Presentation  General Appearance:  Appropriate for Environment; Fairly Groomed  Eye Contact: Good  Speech: Clear and Coherent  Speech Volume: Normal  Handedness: Right   Mood and Affect  Mood: Euthymic  Affect: Congruent  Thought Process  Thought Processes: Linear  Descriptions of Associations:Intact  Orientation:Full (Time, Place and Person)  Thought Content:Logical  History of Schizophrenia/Schizoaffective disorder:No data recorded Duration of Psychotic Symptoms:No data recorded Hallucinations:Hallucinations: None (no voices x3 days)  Ideas of Reference:None  Suicidal Thoughts:Suicidal Thoughts: No  Homicidal Thoughts:Homicidal Thoughts: No  Sensorium  Memory: Immediate Good; Recent Good  Judgment: Fair  Insight: Fair   Community education officer  Concentration: Good  Attention  Span: Good  Recall: Good  Fund of Knowledge: Good  Language: Good  Psychomotor Activity  Psychomotor  Activity: Psychomotor Activity: Normal  Assets  Assets: Resilience; Social Support; Physical Health; Housing; Catering manager; Desire for Improvement; Communication Skills; Intimacy; Vocational/Educational  Sleep  Sleep: Sleep: Good  Physical Exam: Physical Exam Vitals and nursing note reviewed.  Constitutional:      Appearance: She is normal weight.  HENT:     Head: Normocephalic.  Neurological:     Mental Status: She is alert and oriented to person, place, and time.  Psychiatric:        Attention and Perception: She does not perceive auditory or visual hallucinations.        Mood and Affect: Mood and affect normal.        Speech: Speech normal.        Behavior: Behavior normal. Behavior is cooperative.        Thought Content: Thought content is not paranoid or delusional. Thought content does not include homicidal or suicidal ideation. Thought content does not include homicidal or suicidal plan.        Cognition and Memory: Cognition and memory normal.        Judgment: Judgment normal.    Review of Systems  Psychiatric/Behavioral:  Negative for depression, substance abuse and suicidal ideas.    Blood pressure 120/78, pulse (!) 112, temperature 97.9 F (36.6 C), temperature source Oral, resp. rate 14, height 5\' 6"  (1.676 m), last menstrual period 07/24/2018, SpO2 99 %. Body mass index is 45.03 kg/m.  COGNITIVE FEATURES THAT CONTRIBUTE TO RISK:  Thought constriction (tunnel vision)    SUICIDE RISK:   Moderate:  Frequent suicidal ideation with limited intensity, and duration, some specificity in terms of plans, no associated intent, good self-control, limited dysphoria/symptomatology, some risk factors present, and identifiable protective factors, including available and accessible social support.  PLAN OF CARE:  Activity: As tolerated  Diet:  Heart healthy  Other: -Follow-up with your outpatient psychiatric provider -instructions on appointment date, time, and address (location) are provided to you in discharge paperwork.   -Take your psychiatric medications as prescribed at discharge - instructions are provided to you in the discharge paperwork.    -Follow-up with outpatient primary care doctor and other specialists -for management of preventative medicine and chronic medical disease, including:   -Testing: Follow-up with outpatient provider for abnormal lab results:  none   -Recommend abstinence from alcohol, tobacco, and other illicit drug use at discharge.    -If your psychiatric symptoms recur, worsen, or if you have side effects to your psychiatric medications, call your outpatient psychiatric provider, 911, [phone] or go to the nearest emergency department.   -If suicidal thoughts recur, call your outpatient psychiatric provider, 911, [phone] or go to the nearest emergency department.  I certify that inpatient services furnished can reasonably be expected to improve the patient's condition.   Inda Merlin, NP 10/27/2022, 9:51 AM

## 2022-10-27 NOTE — Progress Notes (Signed)
Pt discharged at this time. Pt left facility withy husband. Pt removed all belongings, medications, prescriptions, and valuables. Pt denies SI/HI/self harm thoughts. Pt denies a/v hallucinations. Pt verbalized understanding of medications and follow up care.

## 2022-10-27 NOTE — Discharge Summary (Signed)
Physician Discharge Summary Note  Patient:  Joan [name] is an 42 y.o., female MRN:  [id number] DOB:  1980-10-07 Patient phone:  [phone] (home)  Patient address:   [address] [place] [address],  Total Time spent with patient: 30 minutes  Date of Admission:  10/23/2022 Date of Discharge: 10/27/2022  Principal Problem: Bipolar 2 disorder, major depressive episode [facility]) Discharge Diagnoses: Principal Problem:   Bipolar 2 disorder, major depressive episode ([place]) Active Problems:   PTSD (post-traumatic stress disorder)   Uncontrolled type 2 diabetes mellitus with hyperglycemia ([place])   Essential hypertension   Hyperlipidemia   Asthma   GAD (generalized anxiety disorder)  Reason for Admission: Joan [name] is a 42 yo Caucasian female with past psychiatric history of bipolar disorder, PTSD and GAD who presented to [place] 10/22/22 after driving herself with complaints of worsening depressive symptoms and suicidal ideations with a plan to drive herself off an embankment or into a tree to end her life. She reported stopping her medications x6 months ago and recently stopped taking her insulin with hopes that the elevated blood glucose would kill her. Pt was transferred to the [facility] ER for elevated blood glucose and medical clearance prior to being transferred voluntarily to this [facility] for treatment and stabilization of her mental status.    24-hour chart review: Vital signs remain slightly elevated; DBP 123/90, HR 110.  Propanolol 10 mg BID started yesterday. Patient remains compliant with scheduled medications; PRN medications received in the past 24 hours has been Tylenol.  Staff report patient is regularly attending unit group sessions and appropriately interacting with peers.   Patient assessment note,  Patient observed intermittently in milieu participating in unit activities and groups then in room laying in bed where she presents alert and oriented. Calm and  cooperative. Mood euthymic, congruent affect. Thought content logical and linear. She reports improved mood and sleep. States prior to admission she wasn't sleeping more than 2 hours at a time. 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 Reports plan to try to transfer to a different area for work as she plans to continue with school. Provider discussed sleep hygiene, over-stimulation; in which patient states plan to decrease stimulation prior to bed for improved sleep. She denies any SI/HI/AH, or paranoia. No delusional thoughts noted. She contracts for safety and provides permission to contact her husband for safety and discharge planning.   Collateral: Joan [name] (husband) [phone] 'I think she's improved quite a lot since she's got there. I think the therapy and medication has helped a lot. When she continues with therapy and medication I think she'll be back to 100%'. Denies any access to firearms. Acknowledges understanding of safety planning regarding medications and sharps. Denies any safety concerns with patient discharging home today. Plan to transport home.   Past Psychiatric History: see H&P  Past Medical History:  Past Medical History:  Diagnosis Date   Acute asthma exacerbation 04/10/2018   Acute non-recurrent maxillary sinusitis 09/25/2018   Asthma    Asthma exacerbation 08/03/2020   Auditory hallucinations    only after anesthesia   BV (bacterial vaginosis) 02/13/2018   COVID-19 virus infection 08/16/2019   Diabetes mellitus without complication ([place])    diet controlled   Diabetes mellitus, type II ([place])  insulin, jardiance   Dyspnea    with exertion   Essential hypertension    Essential hypertension 11/24/2017   GERD (gastroesophageal reflux disease)     occasionally-NO MEDS   History of placement of ear tubes 05/20/2018   Hyperlipidemia    Kidney stone 02/11/2019   Localized skin mass, lump, or swelling 01/26/2021   MDD (major depressive disorder)    Miscarriage    Nausea & vomiting 10/05/2019   PTSD (post-traumatic stress disorder)    Right lower quadrant abdominal pain 02/13/2018   Tachycardia     Past Surgical History:  Procedure Laterality Date   ABDOMINAL HYSTERECTOMY     ADENOIDECTOMY     CHOLECYSTECTOMY  2009   CYSTOSCOPY N/A 07/31/2018   Procedure: CYSTOSCOPY;  Surgeon: Benjaman Kindler, MD;  Location: [facility] ORS;  Service: Gynecology;  Laterality: N/A;   LAPAROSCOPIC BILATERAL SALPINGECTOMY Bilateral 07/31/2018   Procedure: LAPAROSCOPIC BILATERAL SALPINGECTOMY;  Surgeon: Benjaman Kindler, MD;  Location: [facility] ORS;  Service: Gynecology;  Laterality: Bilateral;   LAPAROSCOPIC HYSTERECTOMY N/A 07/31/2018   Procedure: HYSTERECTOMY TOTAL LAPAROSCOPIC;  Surgeon: Benjaman Kindler, MD;  Location: [facility] ORS;  Service: Gynecology;  Laterality: N/A;   LAPAROSCOPY N/A 06/07/2019   Procedure: LAPAROSCOPY OPERATIVE, WITH PERITONEAL BIOPSIES;  Surgeon: Benjaman Kindler, MD;  Location: [facility] ORS;  Service: Gynecology;  Laterality: N/A;   LYSIS OF ADHESION N/A 07/31/2018   Procedure: LYSIS OF ADHESION;  Surgeon: Benjaman Kindler, MD;  Location: [facility] ORS;  Service: Gynecology;  Laterality: N/A;   OVARY SURGERY Right    cyst removed a while ago   TONSILLECTOMY     tubes in ear     Family History:  Family History  Problem Relation Age of Onset   Asthma Mother    Diabetes Mother    Hyperlipidemia Mother    Hypertension Mother    Diabetes Father    Hyperlipidemia Father    Hypertension Father    Diabetes Brother    Depression Brother    Alcohol abuse Brother    Depression Maternal Grandmother    Stroke Paternal Grandmother    Breast cancer Neg Hx    Family Psychiatric History: see H&P Social History:  Social History   Substance and  Sexual Activity  Alcohol Use No     Social History   Substance and Sexual Activity  Drug Use No    Social History   Socioeconomic History   Marital status: Married    Spouse name: chip   Number of children: 0   Years of education: Not on file   Highest education level: High school graduate  Occupational History   Occupation: day care worker  Tobacco Use   Smoking status: Never   Smokeless tobacco: Never  Vaping Use   Vaping Use: Never used  Substance and Sexual Activity   Alcohol use: No   Drug use: No   Sexual activity: Not on file  Other Topics Concern   Not on file  Social History Narrative   Not on file   Social Determinants of Health   Financial Resource Strain: Low Risk  (04/27/2018)   Overall Financial Resource Strain (CARDIA)    Difficulty of Paying Living Expenses: Not hard at all  Food Insecurity: No Food Insecurity (10/23/2022)   Hunger Vital Sign    Worried About Running Out of Food in the Last Year: Never true    Ran Out of Food in the Last Year: Never true  Transportation Needs: No Transportation Needs (10/23/2022)   PRAPARE -  Hydrologist (Medical): No    Lack of Transportation (Non-Medical): No  Physical Activity: Inactive (04/27/2018)   Exercise Vital Sign    Days of Exercise per Week: 0 days    Minutes of Exercise per Session: 0 min  Stress: Stress Concern Present (04/27/2018)   [place]    Feeling of Stress : Very much  Social Connections: Unknown (04/27/2018)   Social Connection and Isolation Panel [NHANES]    Frequency of Communication with Friends and Family: Not on file    Frequency of Social Gatherings with Friends and Family: Not on file    Attends Religious Services: More than 4 times per year    Active Member of [employer] or Organizations: No    Attends Archivist Meetings: Never    Marital Status: Married    Hospital Course:   SYLVIE ETHERIDGE was admitted for Bipolar 2 disorder, major depressive episode ([place]) and crisis management.  She was treated with the following medications see MAR.  Joan [name] was discharged with current medication and was instructed on how to take medications as prescribed; (details listed below under Medication List).  Medical problems were identified and treated as needed.  Home medications were restarted as appropriate.  Improvement was monitored by observation and Joan [name] daily report of symptom reduction.  Emotional and mental status was monitored by daily self-inventory reports completed by Joan [name] and clinical staff.         Joan [name] was evaluated by the treatment team for stability and plans for continued recovery upon discharge.  Joan [name] motivation was an integral factor for scheduling further treatment.  Employment, transportation, bed availability, health status, family support, and any pending legal issues were also considered during her hospital stay.  She was offered further treatment options upon discharge including but not limited to Residential, Intensive Outpatient, and Outpatient treatment.  Joan [name] will follow up with the services as listed below under Follow Up Information.     Upon completion of this admission the Joan [name] was both mentally and medically stable for discharge denying suicidal/homicidal ideation, auditory/visual/tactile hallucinations, delusional thoughts and paranoia.      Physical Findings: AIMS:  , ,  ,  ,    CIWA:    COWS:     Musculoskeletal: Strength & Muscle Tone: within normal limits Gait & Station: normal Patient leans: N/A   Psychiatric Specialty Exam:  Presentation  General Appearance:  Appropriate for Environment; Fairly Groomed  Eye Contact: Good  Speech: Clear and Coherent  Speech Volume: Normal  Handedness: Right   Mood and Affect  Mood: Euthymic  Affect: Congruent   Thought  Process  Thought Processes: Linear  Descriptions of Associations:Intact  Orientation:Full (Time, Place and Person)  Thought Content:Logical  History of Schizophrenia/Schizoaffective disorder:No data recorded Duration of Psychotic Symptoms:No data recorded Hallucinations:Hallucinations: None (no voices x3 days)  Ideas of Reference:None  Suicidal Thoughts:Suicidal Thoughts: No  Homicidal Thoughts:Homicidal Thoughts: No   Sensorium  Memory: Immediate Good; Recent Good  Judgment: Fair  Insight: Fair   Community education officer  Concentration: Good  Attention Span: Good  Recall: Good  Fund of Knowledge: Good  Language: Good   Psychomotor Activity  Psychomotor Activity: Psychomotor Activity: Normal   Assets  Assets: Resilience; Social Support; Physical Health; Housing; Catering manager; Desire for Improvement; Communication Skills; Intimacy; Vocational/Educational   Sleep  Sleep: Sleep: Good  Physical Exam: Physical Exam Vitals and nursing note reviewed.    ROS Blood pressure 120/78, pulse (!) 112, temperature 97.9 F (36.6 C), temperature source Oral, resp. rate 14, height 5\' 6"  (1.676 m), last menstrual period 07/24/2018, SpO2 99 %. Body mass index is 45.03 kg/m.   Social History   Tobacco Use  Smoking Status Never  Smokeless Tobacco Never   Tobacco Cessation:  N/A, patient does not currently use tobacco products   Blood Alcohol level:  Lab Results  Component Value Date   ETH <10 10/22/2022   ETH <10 [id number]    Metabolic Disorder Labs:  Lab Results  Component Value Date   HGBA1C 12.6 (H) 10/22/2022   MPG 315 10/22/2022   MPG 298 01/12/2021   Lab Results  Component Value Date   PROLACTIN 11.1 10/22/2022   PROLACTIN 14.5 05/05/2015   Lab Results  Component Value Date   CHOL 247 (H) 10/22/2022   TRIG 297 (H) 10/22/2022   HDL 65 10/22/2022   CHOLHDL 3.8 10/22/2022   VLDL 59 (H) 10/22/2022   LDLCALC 123  (H) 10/22/2022   [place]  09/03/2019     Comment:     . LDL cholesterol not calculated. Triglyceride levels greater than 400 mg/dL invalidate calculated LDL results. . Reference range: <100 . Desirable range <100 mg/dL for primary prevention;   <70 mg/dL for patients with CHD or diabetic patients  with > or = 2 CHD risk factors. Marland Kitchen LDL-C is now calculated using the Martin-Hopkins  calculation, which is a validated novel method providing  better accuracy than the Friedewald equation in the  estimation of LDL-C.  Cresenciano Genre et al. Annamaria Helling. [id number]): 2061-2068  (http://education.[employer].com/faq/FAQ164)     See Psychiatric Specialty Exam and Suicide Risk Assessment completed by Attending Physician prior to discharge.  Discharge destination:  Home  Is patient on multiple antipsychotic therapies at discharge:  No   Has Patient had three or more failed trials of antipsychotic monotherapy by history:  No  Recommended Plan for Multiple Antipsychotic Therapies: NA  Discharge Instructions     Amb Referral to Nutrition and Diabetic Education   Complete by: As directed    Diet - low sodium heart healthy   Complete by: As directed    Increase activity slowly   Complete by: As directed       Allergies as of 10/27/2022       Reactions   Ibuprofen Swelling, Other (See Comments)   Facial    Ciprofloxacin Hives, Rash   Metformin And Related Other (See Comments)   Elevated Lactic Acid   Penicillins Hives, Rash, Other (See Comments)   Has patient had a PCN reaction causing immediate rash, facial/tongue/throat swelling, SOB or lightheadedness with hypotension: No Has patient had a PCN reaction causing severe rash involving mucus membranes or skin necrosis: No Has patient had a PCN reaction that required hospitalization: No Has patient had a PCN reaction occurring within the last 10 years: No If all of the above answers are "NO", then may proceed with Cephalosporin use. THE  PATIENT IS ABLE TO TOLERATE CEPHALOSPORINS WITHOUT DIFFIC   Sulfa Antibiotics Hives, Rash   Other Other (See Comments)   ALL NUTS-SCRATCHY THROAT   Shellfish Allergy Other (See Comments)   ALL SEAFOOD-SCRATCHY THROAT   Trazodone And Nefazodone Other (See Comments)   "Causes me to hear voices"        Medication List     TAKE these medications      Indication  albuterol 108 (90 Base) MCG/ACT inhaler Commonly known as: VENTOLIN HFA Inhale 1 puff into the lungs every 4 (four) hours as needed for wheezing or shortness of breath.  Indication: Asthma   ARIPiprazole 5 MG tablet Commonly known as: ABILIFY Take 1 tablet (5 mg total) by mouth daily. Start taking on: October 28, 2022  Indication: Major Depressive Disorder, bipolar 1 d/o   atorvastatin 20 MG tablet Commonly known as: LIPITOR Take 1 tablet (20 mg total) by mouth daily. for cholesterol.  Indication: High Amount of Fats in the Blood   cloNIDine 0.1 MG tablet Commonly known as: CATAPRES Take 1 tablet (0.1 mg total) by mouth 2 (two) times daily as needed (SBP>160 or DBP>100).  Indication: High Blood Pressure Disorder   hydrOXYzine 25 MG tablet Commonly known as: ATARAX Take 1 tablet (25 mg total) by mouth 3 (three) times daily as needed for anxiety.  Indication: Feeling Anxious   hydrOXYzine 50 MG tablet Commonly known as: ATARAX Take 1 tablet (50 mg total) by mouth at bedtime.  Indication: insomnia   NovoLOG 70/30 FlexPen (70-30) 100 UNIT/ML FlexPen Generic drug: insulin aspart protamine - aspart Inject 40 units into the skin every morning and 36 units every evening for diabetes. What changed:  how much to take when to take this additional instructions  Indication: Type 2 Diabetes   propranolol 10 MG tablet Commonly known as: INDERAL Take 1 tablet (10 mg total) by mouth 2 (two) times daily.  Indication: Feeling Anxious   sertraline 50 MG tablet Commonly known as: ZOLOFT Take 1 tablet (50 mg total) by  mouth daily. Start taking on: October 28, 2022  Indication: Major Depressive Disorder   Trulicity [id number] Sopn Generic drug: Dulaglutide Inject 0.75 mg into the skin once a week. for diabetes.  Indication: Type 2 Diabetes   valsartan 80 MG tablet Commonly known as: DIOVAN Take 1 tablet (80 mg total) by mouth daily. for blood pressure.  Indication: High Blood Pressure Disorder        Follow-up Information     [facility] [place]. Go on 11/11/2022.   Specialty: Behavioral Health Why: You have an appointment medication management services on 11/11/22 at 9:00 am.  This appointment will be held in person.  * You must go to the office prior to 11/08/22 to fill out paperwork, in order to keep your appointment. Contact information: [place] [id number] mc [place] [facility] at [facility] Follow up on 11/22/2022.   Specialty: Behavioral Health Why: You have an appointment for therapy services on  11/22/22 at 9:00 am. This will be a Virtual appointment via [place].  You must be in the Virtual waiting room 5 minutes prior to your appt, or your appt may not be rescheduled.   *  Please give 24 hours notice to cancel/reschedule, or it is considered a 'No show'. Contact information: [place] Follow up.   Why: This agency is available for uninsured patients, if ever needed in the future. Contact information: [address],  [place] [address] Phone: [phone] Fax: [phone]               Follow-up recommendations:   Activity: As tolerated  Diet: Heart healthy  Other: -Follow-up with your outpatient psychiatric provider -  instructions on appointment date, time, and address (location) are provided to you in discharge paperwork.    -Take your psychiatric medications as prescribed at discharge - instructions are provided to you in the discharge paperwork.    -Follow-up with outpatient primary care doctor and other specialists -for management of preventative medicine and chronic medical disease, including:   -Testing: Follow-up with outpatient provider for abnormal lab results:  none   -Recommend abstinence from alcohol, tobacco, and other illicit drug use at discharge.    -If your psychiatric symptoms recur, worsen, or if you have side effects to your psychiatric medications, call your outpatient psychiatric provider, 911, [phone] or go to the nearest emergency department.   -If suicidal thoughts recur, call your outpatient psychiatric provider, 911, [phone] or go to the nearest emergency department.  Comments:    Signed: Inda Merlin, NP 10/27/2022, 9:50 AM

## 2022-11-11 ENCOUNTER — Ambulatory Visit (HOSPITAL_COMMUNITY): Payer: Self-pay | Admitting: Psychiatry

## 2022-11-18 ENCOUNTER — Telehealth: Payer: Self-pay | Admitting: Primary Care

## 2022-11-18 DIAGNOSIS — E1165 Type 2 diabetes mellitus with hyperglycemia: Secondary | ICD-10-CM

## 2022-11-18 MED ORDER — TRULICITY 1.5 MG/0.5ML ~~LOC~~ SOAJ: 1.5000 mg | SUBCUTANEOUS | 0 refills | Status: DC

## 2022-11-18 NOTE — Telephone Encounter (Signed)
Prescription Request  11/18/2022  LOV: 09/26/2022  What is the name of the medication or equipment? Dulaglutide (TRULICITY) 0.75 MG/0.5ML SOPN   Have you contacted your pharmacy to request a refill? No   Which pharmacy would you like this sent to?  [id number] - [place], [place] - [address] AT [facility] OF [address]  [place] [address] Phone: [phone] Fax: [phone]    Patient notified that their request is being sent to the clinical staff for review and that they should receive a response within 2 business days.   Please advise at Mobile [phone] (mobile)

## 2022-11-18 NOTE — Telephone Encounter (Signed)
Unable to reach patient. Left voicemail to return call to our office.   Need to verify if patient was ever able to pickup Rx from February at [facility] in [place] or could they never get it in stock?

## 2022-11-18 NOTE — Telephone Encounter (Signed)
Patient stated that she was delayed with starting the Trulicity due to shortage. Disregard note under medication, patient stated this is incorrect, she was able to pick up Rx.    Patient used all 4 pen injectors, using the last one today. She would like refill sent to [facility] in [place].

## 2022-11-18 NOTE — Telephone Encounter (Signed)
Noted. Per our last meeting we will increase the Trulicity to 1.5 mg weekly. I sent this to her pharmacy. We will see her in a few days for follow up.

## 2022-11-18 NOTE — Telephone Encounter (Signed)
Pt called back returning Kelli's call. Told pt Kelli's response. Pt stated [facility] in [place] [facility] in [place] due to her living in [place] & not [place]. Call back # [phone]

## 2022-11-19 ENCOUNTER — Encounter (HOSPITAL_COMMUNITY): Payer: Self-pay

## 2022-11-19 ENCOUNTER — Ambulatory Visit (HOSPITAL_COMMUNITY): Payer: No Typology Code available for payment source | Admitting: Psychiatry

## 2022-11-19 NOTE — Telephone Encounter (Signed)
Called patient and reviewed all information. Patient verbalized understanding. Patient had to move appt due to a work conflict. Appt rescheduled for 11/29/22

## 2022-11-21 ENCOUNTER — Encounter: Payer: Self-pay | Admitting: Primary Care

## 2022-11-22 ENCOUNTER — Ambulatory Visit (INDEPENDENT_AMBULATORY_CARE_PROVIDER_SITE_OTHER): Payer: No Typology Code available for payment source | Admitting: Licensed Clinical Social Worker

## 2022-11-22 ENCOUNTER — Ambulatory Visit: Payer: Self-pay | Admitting: Primary Care

## 2022-11-22 DIAGNOSIS — F3181 Bipolar II disorder: Secondary | ICD-10-CM

## 2022-11-22 DIAGNOSIS — F4310 Post-traumatic stress disorder, unspecified: Secondary | ICD-10-CM

## 2022-11-22 DIAGNOSIS — F411 Generalized anxiety disorder: Secondary | ICD-10-CM

## 2022-11-22 DIAGNOSIS — F331 Major depressive disorder, recurrent, moderate: Secondary | ICD-10-CM
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
## 2022-11-25 ENCOUNTER — Telehealth: Payer: Self-pay | Admitting: Primary Care

## 2022-11-25 ENCOUNTER — Other Ambulatory Visit (HOSPITAL_COMMUNITY): Payer: Self-pay

## 2022-11-25 NOTE — Telephone Encounter (Signed)
Patient contacted the office regarding medication  Dulaglutide (TRULICITY) 1.5 MG/0.5ML SOPN , wanting to know if this medication has been giver prior authorization yet. Was not able to find anything on this, patient states she has not heard anything from the pharmacy. Would lie this looked into and if needed, advise [phone].

## 2022-11-26 ENCOUNTER — Other Ambulatory Visit (HOSPITAL_COMMUNITY): Payer: Self-pay

## 2022-11-26 NOTE — Telephone Encounter (Signed)
Called pharmacy to get prescription insurance info. Ran test claim, received paid claim. Per approval letter in media, PA for Trulicity has already been approved.

## 2022-11-26 NOTE — Telephone Encounter (Signed)
Called and notified patient, she states she needs this approved for the Dulaglutide (TRULICITY) 1.5 MG/0.5ML SOPN .   The approval letter in the chart is for the 0.75mg  dosage.

## 2022-11-26 NOTE — Telephone Encounter (Signed)
Initiated PA via CMM. PA cancelled, Available without authorization. Called pharmacy, they were able to get a paid claim through insurance. No PA needed

## 2022-11-26 NOTE — Telephone Encounter (Signed)
Patient has been notified

## 2022-11-29 ENCOUNTER — Ambulatory Visit (INDEPENDENT_AMBULATORY_CARE_PROVIDER_SITE_OTHER): Payer: BC Managed Care – PPO | Admitting: Primary Care

## 2022-11-29 VITALS — BP 126/80 | HR 90 | Temp 97.3°F | Ht 66.0 in | Wt 286.0 lb

## 2022-11-29 DIAGNOSIS — G43009 Migraine without aura, not intractable, without status migrainosus: Secondary | ICD-10-CM | POA: Diagnosis not present

## 2022-11-29 DIAGNOSIS — F332 Major depressive disorder, recurrent severe without psychotic features: Secondary | ICD-10-CM

## 2022-11-29 DIAGNOSIS — F411 Generalized anxiety disorder: Secondary | ICD-10-CM | POA: Diagnosis not present

## 2022-11-29 DIAGNOSIS — E1165 Type 2 diabetes mellitus with hyperglycemia: Secondary | ICD-10-CM | POA: Diagnosis not present

## 2022-11-29 MED ORDER — SERTRALINE HCL 50 MG PO TABS: 50.0000 mg | ORAL_TABLET | Freq: Every day | ORAL | 0 refills | Status: DC

## 2022-11-29 MED ORDER — PROPRANOLOL HCL ER 80 MG PO CP24: 80.0000 mg | ORAL_CAPSULE | Freq: Every day | ORAL | 0 refills | Status: DC

## 2022-11-29 MED ORDER — SUMATRIPTAN SUCCINATE 50 MG PO TABS: ORAL_TABLET | ORAL | 0 refills | Status: DC

## 2022-11-29 NOTE — Assessment & Plan Note (Addendum)
Recent hospitalization, reviewed labs and notes.  Continue Zoloft 50 mg daily and Abilify 5 mg daily. Discussed that she needs to reschedule her appointment with psychiatry. Continue therapy.

## 2022-11-29 NOTE — Patient Instructions (Signed)
Start checking your blood sugar levels.  Appropriate times to check your blood sugar levels are:  -Before any meal (breakfast, lunch, dinner) -Two hours after any meal (breakfast, lunch, dinner) -Bedtime  Record your readings and send me some readings via MyChart in 2 weeks.  Start propranolol ER 80 mg for migraine/headache prevention. Take 1 capsule by mouth every evening.  You may take sumatriptan for migraine abortion. Take 1 tablet by mouth at migraine onset. You may take another tablet 2 hours later if migraine persists. This may cause drowsiness.  Reschedule your psychiatry appointment.   Please schedule a follow up visit for 2 months.  It was a pleasure to see you today!

## 2022-11-29 NOTE — Assessment & Plan Note (Signed)
Commended her on resuming treatment.  Strongly advised she start checking glucose levels.  Continue Novolog 70/30 50 units BID but she will need to start checking glucose levels and titrate down if needed. Continue Trulicity 1.5 mg weekly.  Avoid metformin given lactic acidosis.   I've asked that she send me glucose readings in 2-3 weeks via MyChart.  Follow up in 2 months.
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
## 2022-11-29 NOTE — Assessment & Plan Note (Signed)
Continued despite BP control.  Start propranolol ER 80 mg HS for migraine prevention. Start sumatriptan 50 mg PRN for migraine abortion.  She will update.

## 2022-12-11 ENCOUNTER — Encounter: Payer: Self-pay | Admitting: Primary Care

## 2022-12-11 ENCOUNTER — Ambulatory Visit (INDEPENDENT_AMBULATORY_CARE_PROVIDER_SITE_OTHER): Payer: Self-pay | Admitting: Primary Care

## 2022-12-11 ENCOUNTER — Encounter: Payer: Self-pay | Admitting: *Deleted

## 2022-12-11 VITALS — BP 126/84 | HR 90 | Temp 97.3°F | Ht 66.0 in | Wt 283.0 lb

## 2022-12-11 DIAGNOSIS — N6321 Unspecified lump in the left breast, upper outer quadrant: Secondary | ICD-10-CM | POA: Insufficient documentation

## 2022-12-11 NOTE — Patient Instructions (Signed)
You will be contacted via phone regarding your mammogram.  It was a pleasure to see you today!   
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
## 2022-12-11 NOTE — Assessment & Plan Note (Signed)
Orders placed for diagnostic mammogram and bilateral breast ultrasound. Reviewed mammogram from 2023 per Care Everywhere.

## 2022-12-12 ENCOUNTER — Other Ambulatory Visit: Payer: Self-pay

## 2022-12-12 ENCOUNTER — Other Ambulatory Visit: Payer: Self-pay | Admitting: *Deleted

## 2022-12-12 ENCOUNTER — Inpatient Hospital Stay
Admission: RE | Admit: 2022-12-12 | Discharge: 2022-12-12 | Disposition: A | Discharge status: Home / Self Care | Payer: Self-pay | Source: Ambulatory Visit | Attending: Primary Care | Admitting: Primary Care

## 2022-12-12 DIAGNOSIS — Z1231 Encounter for screening mammogram for malignant neoplasm of breast: Secondary | ICD-10-CM

## 2022-12-12 DIAGNOSIS — N6321 Unspecified lump in the left breast, upper outer quadrant: Secondary | ICD-10-CM

## 2022-12-16 ENCOUNTER — Ambulatory Visit
Admission: RE | Admit: 2022-12-16 | Discharge: 2022-12-16 | Disposition: A | Discharge status: Home / Self Care | Payer: Self-pay | Source: Ambulatory Visit | Attending: Obstetrics and Gynecology | Admitting: Obstetrics and Gynecology

## 2022-12-16 ENCOUNTER — Ambulatory Visit: Payer: Self-pay | Attending: Hematology and Oncology | Admitting: Hematology and Oncology

## 2022-12-16 VITALS — BP 117/72 | Wt 285.0 lb

## 2022-12-16 DIAGNOSIS — N6321 Unspecified lump in the left breast, upper outer quadrant: Secondary | ICD-10-CM | POA: Insufficient documentation

## 2022-12-16 NOTE — Patient Instructions (Signed)
Taught Joan [name] about self breast awareness and gave educational materials to take home. Patient did not need a Pap smear today due to hysterectomy. [facility]. Appointment scheduled for 12/16/22. Patient aware of appointment and will be there. Let patient know will follow up with her within the next couple weeks with results. Joan [name] verbalized understanding.  Pascal Lux, NP 12:18 PM

## 2022-12-16 NOTE — Progress Notes (Signed)
Joan [name] is a 42 y.o. female who presents to [facility] clinic today with complaint of left breast mass. This was evaluated last year and found to be benign.    Pap Smear: Pap not smear completed today due to hysterectomy.   Physical exam: Breasts Breasts symmetrical. No skin abnormalities bilateral breasts. No nipple retraction bilateral breasts. No nipple discharge bilateral breasts. No lymphadenopathy. No lumps palpated bilateral breasts.  MS DIGITAL DIAG TOMO BILAT  Result Date: 01/11/2020 CLINICAL DATA:  42 year old patient presents for 1 year follow-up of a probably benign asymmetry in the medial left breast, anterior third and annual exam of both breasts. EXAM: DIGITAL DIAGNOSTIC BILATERAL MAMMOGRAM WITH CAD AND TOMO COMPARISON:  January 15, 2019 and July 19, 2019 ACR Breast Density Category b: There are scattered areas of fibroglandular density. FINDINGS: No mass, architectural distortion, or suspicious microcalcification is identified to suggest malignancy in either breast. Previously described asymmetry in the medial left breast is slightly less apparent and has a mammographic appearance consistent with fibroglandular tissue. Mammographic images were processed with CAD. IMPRESSION: No evidence of malignancy in either breast. RECOMMENDATION: Screening mammogram in one year.(Code:[id number]) I have discussed the findings and recommendations with the patient. If applicable, a reminder letter will be sent to the patient regarding the next appointment. BI-RADS CATEGORY  1: Negative. Electronically Signed   By: Britta Mccreedy M.D.   On: 01/11/2020 09:51   MM DIAG BREAST TOMO UNI LEFT  Result Date: 07/19/2019 CLINICAL DATA:  42 year old female presenting for follow-up of a likely benign left breast asymmetry. EXAM: DIGITAL DIAGNOSTIC UNILATERAL LEFT MAMMOGRAM WITH CAD AND TOMO COMPARISON:  Previous exam(s). ACR Breast Density Category b: There are scattered areas of fibroglandular density.  FINDINGS: The small asymmetry in the medial anterior left breast is mammographically stable. No new suspicious calcifications, masses or areas of distortion are seen in the bilateral breasts. Mammographic images were processed with CAD. IMPRESSION: Stable likely benign left breast asymmetry. RECOMMENDATION: Bilateral diagnostic mammogram in June of 2021. I have discussed the findings and recommendations with the patient. If applicable, a reminder letter will be sent to the patient regarding the next appointment. BI-RADS CATEGORY  3: Probably benign. Electronically Signed   By: Frederico Hamman M.D.   On: 07/19/2019 15:08   MM DIAG BREAST TOMO BILATERAL  Result Date: 01/15/2019 CLINICAL DATA:  Patient presents for palpable abnormality within the medial right breast. EXAM: DIGITAL DIAGNOSTIC BILATERAL MAMMOGRAM WITH CAD AND TOMO ULTRASOUND BILATERAL BREAST COMPARISON:  None ACR Breast Density Category b: There are scattered areas of fibroglandular density. FINDINGS: Within the medial aspect of the left breast anterior depth there is a small focal asymmetry. No suspicious mass identified within the medial aspect of the right breast underlying the palpable marker. No masses, calcifications or areas of distortion identified within either breast. Mammographic images were processed with CAD. On physical exam, dense tissue is palpated within the medial right breast. Targeted ultrasound is performed, showing dense tissue without suspicious mass within the medial right breast. No sonographic correlate is identified for the focal asymmetry within the medial aspect of the left breast. IMPRESSION: Probably benign focal asymmetry medial left breast. No suspicious abnormality at the site of palpable concern medial right breast. RECOMMENDATION: Left breast diagnostic mammography and ultrasound in 6 months to ensure stability of probably benign left breast focal asymmetry. Continued clinical evaluation for right breast palpable  abnormality. I have discussed the findings and recommendations with the patient. Results were also provided in writing  at the conclusion of the visit. If applicable, a reminder letter will be sent to the patient regarding the next appointment. BI-RADS CATEGORY  3: Probably benign. Electronically Signed   By: Annia Belt M.D.   On: 01/15/2019 15:21         Pelvic/Bimanual Pap is not indicated today    Smoking History: Patient has never smoked and was not referred to quit line.    Patient Navigation: Patient education provided. Access to services provided for patient through [facility] program. No interpreter provided. No transportation provided   Colorectal Cancer Screening: Per patient has never had colonoscopy completed No complaints today.    Breast and Cervical Cancer Risk Assessment: Patient does not have family history of breast cancer, known genetic mutations, or radiation treatment to the chest before age 28. Patient does not have history of cervical dysplasia, immunocompromised, or DES exposure in-utero.  Risk Assessment   No risk assessment data for the current encounter  Risk Scores       01/11/2020   Last edited by: Scarlett Presto, RN   5-year risk: 0.7 %   Lifetime risk: 12.1 %            A: [facility] exam without pap smear Complaint of persistent left breast mass; evaluated last year and found to be benign.   P: Referred patient to [facility] for a diagnostic mammogram. Appointment scheduled 12/16/22.  Pascal Lux, NP 12/16/2022 12:17 PM

## 2022-12-20 ENCOUNTER — Ambulatory Visit (INDEPENDENT_AMBULATORY_CARE_PROVIDER_SITE_OTHER): Payer: Self-pay | Admitting: Licensed Clinical Social Worker

## 2022-12-20 ENCOUNTER — Encounter (HOSPITAL_COMMUNITY): Payer: Self-pay

## 2022-12-20 DIAGNOSIS — F4310 Post-traumatic stress disorder, unspecified: Secondary | ICD-10-CM

## 2022-12-20 DIAGNOSIS — F3181 Bipolar II disorder: Secondary | ICD-10-CM

## 2022-12-20 DIAGNOSIS — F411 Generalized anxiety disorder: Secondary | ICD-10-CM

## 2022-12-20 NOTE — Progress Notes (Signed)
Therapist contacted patient through My Chart and she did not show for appointment

## 2022-12-24 ENCOUNTER — Telehealth: Payer: Self-pay

## 2022-12-24 DIAGNOSIS — E1165 Type 2 diabetes mellitus with hyperglycemia: Secondary | ICD-10-CM

## 2022-12-24 MED ORDER — TRULICITY 1.5 MG/0.5ML ~~LOC~~ SOAJ: 1.5000 mg | SUBCUTANEOUS | 0 refills | Status: DC

## 2022-12-24 NOTE — Telephone Encounter (Signed)
Received refill request for  

## 2022-12-24 NOTE — Addendum Note (Signed)
Addended by: Doreene Nest on: 12/24/2022 04:20 PM   Modules accepted: Orders

## 2022-12-24 NOTE — Telephone Encounter (Signed)
Refills sent to pharmacy. 

## 2023-01-01 ENCOUNTER — Telehealth: Payer: Self-pay

## 2023-01-01 DIAGNOSIS — E785 Hyperlipidemia, unspecified: Secondary | ICD-10-CM

## 2023-01-01 MED ORDER — ATORVASTATIN CALCIUM 20 MG PO TABS: 20.0000 mg | ORAL_TABLET | Freq: Every day | ORAL | 0 refills | Status: DC

## 2023-01-01 NOTE — Addendum Note (Signed)
Addended by: Doreene Nest on: 01/01/2023 01:43 PM   Modules accepted: Orders

## 2023-01-01 NOTE — Telephone Encounter (Signed)
Received Refill Request for Atorvastatin 20mg  tablets

## 2023-01-02 ENCOUNTER — Ambulatory Visit: Payer: Self-pay | Admitting: Dietician

## 2023-01-03 ENCOUNTER — Encounter (HOSPITAL_COMMUNITY): Payer: Self-pay

## 2023-01-03 ENCOUNTER — Encounter (HOSPITAL_BASED_OUTPATIENT_CLINIC_OR_DEPARTMENT_OTHER): Payer: Self-pay | Admitting: Emergency Medicine

## 2023-01-03 ENCOUNTER — Encounter (HOSPITAL_COMMUNITY): Payer: Self-pay | Admitting: Licensed Clinical Social Worker

## 2023-01-03 ENCOUNTER — Emergency Department (HOSPITAL_BASED_OUTPATIENT_CLINIC_OR_DEPARTMENT_OTHER)
Admission: EM | Admit: 2023-01-03 | Discharge: 2023-01-03 | Disposition: A | Discharge status: Home / Self Care | Payer: Self-pay | Attending: Emergency Medicine | Admitting: Emergency Medicine

## 2023-01-03 ENCOUNTER — Ambulatory Visit (HOSPITAL_COMMUNITY): Payer: No Typology Code available for payment source | Admitting: Licensed Clinical Social Worker

## 2023-01-03 ENCOUNTER — Other Ambulatory Visit (HOSPITAL_BASED_OUTPATIENT_CLINIC_OR_DEPARTMENT_OTHER): Payer: Self-pay

## 2023-01-03 ENCOUNTER — Other Ambulatory Visit: Payer: Self-pay

## 2023-01-03 DIAGNOSIS — R000 Tachycardia, unspecified: Secondary | ICD-10-CM | POA: Insufficient documentation

## 2023-01-03 DIAGNOSIS — R051 Acute cough: Secondary | ICD-10-CM | POA: Insufficient documentation

## 2023-01-03 DIAGNOSIS — J45909 Unspecified asthma, uncomplicated: Secondary | ICD-10-CM | POA: Insufficient documentation

## 2023-01-03 DIAGNOSIS — E119 Type 2 diabetes mellitus without complications: Secondary | ICD-10-CM | POA: Insufficient documentation

## 2023-01-03 DIAGNOSIS — Z8616 Personal history of COVID-19: Secondary | ICD-10-CM | POA: Insufficient documentation

## 2023-01-03 DIAGNOSIS — H6692 Otitis media, unspecified, left ear: Secondary | ICD-10-CM | POA: Insufficient documentation

## 2023-01-03 DIAGNOSIS — I10 Essential (primary) hypertension: Secondary | ICD-10-CM | POA: Insufficient documentation

## 2023-01-03 DIAGNOSIS — Z79899 Other long term (current) drug therapy: Secondary | ICD-10-CM | POA: Insufficient documentation

## 2023-01-03 DIAGNOSIS — Z794 Long term (current) use of insulin: Secondary | ICD-10-CM | POA: Insufficient documentation

## 2023-01-03 MED ORDER — AZITHROMYCIN 250 MG PO TABS
250.0000 mg | ORAL_TABLET | Freq: Every day | ORAL | 0 refills | Status: AC
  Filled 2023-01-03: qty 6, 5d supply, fill #0

## 2023-01-03 NOTE — Progress Notes (Signed)
Therapist contacted patient through My Chart and she did not respond 

## 2023-01-03 NOTE — ED Triage Notes (Signed)
States has been having left ear pain and cough since last weekend

## 2023-01-03 NOTE — Discharge Instructions (Signed)
Thank you for coming to [facility] Emergency Department. You were seen for cough, L ear pain. We did an exam, labs, and imaging, and these showed left acute otitis media. Please take 5 days of azithromycin. Continue using your inhalers at home for your cough. Please follow up with your primary care provider within 1 week as needed if symptoms do not improve.  Do not hesitate to return to the ED or call 911 if you experience: -Worsening symptoms -Chest pain, shortness of breath -Lightheadedness, passing out -Fevers/chills -Anything else that concerns you

## 2023-01-03 NOTE — ED Provider Notes (Signed)
[place] [facility] DEPARTMENT AT [facility] Provider Note   CSN: [id number] Arrival date & time: 01/03/23  4098     History  Chief Complaint  Patient presents with   Otalgia    Joan [name] is a 42 y.o. female with HTN, T2DM, HLD, asthma, recurrent sinusitis, migraines, bipolar 2, PTSD, MDD presents with left ear pain and cough since last weekend. Feels similar to prior AOMs. Cough is productive of green mucous. Has asthma but denies SOB, normal asthma sxs, helped by her inhalers. Denies current wheezing, chest pain, f/c, n/v/d/c, abd pain, LEE, or h/o DVT/PE. States she feels like her hearing is muffled in her left ear. Denies neck pain/headache, sinus pain.   HPI     Home Medications Prior to Admission medications   Medication Sig Start Date End Date Taking? Authorizing Provider  albuterol (VENTOLIN HFA) 108 (90 Base) MCG/ACT inhaler Inhale 1 puff into the lungs every 4 (four) hours as needed for wheezing or shortness of breath. 04/23/17   [provider]  ARIPiprazole (ABILIFY) 5 MG tablet Take 1 tablet (5 mg total) by mouth daily. 10/28/22   Leevy-Johnson, Lina Sar, NP  atorvastatin (LIPITOR) 20 MG tablet Take 1 tablet (20 mg total) by mouth daily. for cholesterol. 01/01/23   Doreene Nest, NP  Dulaglutide (TRULICITY) 1.5 MG/0.5ML SOPN Inject 1.5 mg into the skin once a week. for diabetes. 12/24/22   Doreene Nest, NP  hydrOXYzine (ATARAX) 25 MG tablet Take 1 tablet (25 mg total) by mouth 3 (three) times daily as needed for anxiety. 10/27/22   Leevy-Johnson, Lina Sar, NP  hydrOXYzine (ATARAX) 50 MG tablet Take 1 tablet (50 mg total) by mouth at bedtime. 10/27/22   Leevy-Johnson, Lina Sar, NP  insulin aspart protamine - aspart (NOVOLOG 70/30 FLEXPEN RELION) (70-30) 100 UNIT/ML FlexPen Inject 40 units into the skin every morning and 36 units every evening for diabetes. Patient taking differently: 50 Units 2 (two) times daily before a meal. 01/17/21    Doreene Nest, NP  propranolol ER (INDERAL LA) 80 MG 24 hr capsule Take 1 capsule (80 mg total) by mouth at bedtime. For headache prevention 11/29/22   Doreene Nest, NP  sertraline (ZOLOFT) 50 MG tablet Take 1 tablet (50 mg total) by mouth daily. for anxiety and depression. 11/29/22   Doreene Nest, NP  SUMAtriptan (IMITREX) 50 MG tablet Take 1 tablet by mouth at migraine onset. May repeat in 2 hours if headache persists or recurs. 11/29/22   Doreene Nest, NP  valsartan (DIOVAN) 80 MG tablet Take 1 tablet (80 mg total) by mouth daily. for blood pressure. 09/26/22   Doreene Nest, NP      Allergies    Ibuprofen, Ciprofloxacin, Metformin and related, Penicillins, Sulfa antibiotics, Other, Shellfish allergy, and Trazodone and nefazodone    Review of Systems   Review of Systems Review of systems Negative for f/c.  A 10 point review of systems was performed and is negative unless otherwise reported in HPI.  Physical Exam Updated Vital Signs BP 125/72 (BP Location: Left Arm)   Pulse 99   Temp 98.1 F (36.7 C)   Resp 20   Ht 5\' 6"  (1.676 m)   Wt 131.5 kg   LMP 07/24/2018 (Exact Date) Comment: surgery 07/31/2018  SpO2 98%   BMI 46.81 kg/m  Physical Exam General: Normal appearing female, lying in bed.  HEENT: PERRLA, Sclera anicteric, MMM, trachea midline.  Clear oropharynx. No pain with  palpation of the frontal or maxillary sinuses.  Right TM with mild scarring with no fluid behind erythema.  Left TM with more significant tympanic membrane scarring however intact membrane with erythematous membrane and purulent fluid noted behind. No TTP over L mastoid w/ no erythema.  Cardiology: RRR, no murmurs/rubs/gallops. BL radial and DP pulses equal bilaterally.  Resp: Normal respiratory rate and effort. CTAB, no wheezes, rhonchi, crackles.  Abd: Soft, non-tender, non-distended. No rebound tenderness or guarding.  GU: Deferred. MSK: No peripheral edema or signs of trauma.  Extremities without deformity or TTP.  Skin: warm, dry. Neuro: A&Ox4, CNs II-XII grossly intact. MAEs. Sensation grossly intact.  Psych: Normal mood and affect.   ED Results / Procedures / Treatments   Labs (all labs ordered are listed, but only abnormal results are displayed) Labs Reviewed - No data to display  EKG None  Radiology No results found.  Procedures Procedures    Medications Ordered in ED Medications - No data to display  ED Course/ Medical Decision Making/ A&P                          Medical Decision Making Risk Prescription drug management.    This patient presents to the ED for concern of cough and left ear pain, this involves an extensive number of treatment options, and is a complaint that carries with it a high risk of complications and morbidity.  Patient is overall very well-appearing, afebrile.  She initially presents mildly tachycardic into the 110s however this resolves into the 90s without intervention.  MDM:    Patient does report concern for muffled hearing in the left ear however her tympanic membrane is intact.  She does have extensive scarring over that membrane as if she has had AOM several times in the past.  She does have an erythematous membrane with fluid behind on the left side, I believe she is having an AOM.  Likely her cough is related to a viral upper respiratory infection or bronchitis. Shared decision making about chest xray and patient did not feel it necessary, I agree, with lungs CTAB, no fevers/chills, no SOB. Low c/f pneumonia, pleural effusion, pulm edema. No c/f asthma exacerbation given patient's lack of wheezing or shortness of breath at this time, patient states she doesn't feel like she's having an asthma exacerbation.  Patient denies any chest pain or shortness of breath, no lower extremity edema or history of DVT/PE, no hemoptysis, very low concern for PE at this point.  Patient was mostly interested today in wondering if she  had an ear infection and I do believe she has a left AOM.  Patient notes penicillin allergy that resulted in hives and so we will treat her with second line azithromycin x 5 days.  I encouraged her to continue using her inhalers at home for her cough.  She is vitally stable and overall well-appearing, patient is stable for discharge.  She is given specific discharge instructions and return precautions, instructed to follow-up with her PCP within 1 to 2 weeks.      Labs: I Ordered, and personally interpreted labs.  The pertinent results include:    Additional history obtained from chart review  Social Determinants of Health: Patient lives independently  Disposition:  DC w/ discharge instructions/return precautions. All questions answered to patient's satisfaction.    Co morbidities that complicate the patient evaluation  Past Medical History:  Diagnosis Date   Acute asthma exacerbation 04/10/2018  Acute non-recurrent maxillary sinusitis 09/25/2018   Asthma    Asthma exacerbation 08/03/2020   Auditory hallucinations    only after anesthesia   BV (bacterial vaginosis) 02/13/2018   COVID-19 virus infection 08/16/2019   Diabetes mellitus without complication (HCC)    diet controlled   Diabetes mellitus, type II (HCC)    insulin, jardiance   Dyspnea    with exertion   Essential hypertension    Essential hypertension 11/24/2017   GERD (gastroesophageal reflux disease)    occasionally-NO MEDS   History of placement of ear tubes 05/20/2018   Hyperlipidemia    Kidney stone 02/11/2019   Localized skin mass, lump, or swelling 01/26/2021   MDD (major depressive disorder)    Miscarriage    Nausea & vomiting 10/05/2019   PTSD (post-traumatic stress disorder)    Right lower quadrant abdominal pain 02/13/2018   Tachycardia      Medicines Meds ordered this encounter  Medications   azithromycin (ZITHROMAX) 250 MG tablet    Sig: Take first 2 tablets by mouth together, then take 1  tablet by mouth every day until finished.    Dispense:  6 tablet    Refill:  0    I have reviewed the patients home medicines and have made adjustments as needed  Problem List / ED Course: Problem List Items Addressed This Visit   None Visit Diagnoses     Acute left otitis media    -  Primary   Acute cough                       This note was created using dictation software, which may contain spelling or grammatical errors.    Loetta Rough, MD 01/13/23 [id number]

## 2023-01-06 ENCOUNTER — Telehealth: Payer: Self-pay

## 2023-01-06 NOTE — Transitions of Care (Post Inpatient/ED Visit) (Signed)
Unable to reach pt by phone and left v/m requesting cb [phone].     01/06/2023  Name: Joan [name] MRN: [id number] DOB: May 17, 1981  Today's TOC FU Call Status: Today's TOC FU Call Status:: Unsuccessul Call (1st Attempt) Unsuccessful Call (1st Attempt) Date: 01/06/23  Attempted to reach the patient regarding the most recent Inpatient/ED visit.  Follow Up Plan: Additional outreach attempts will be made to reach the patient to complete the Transitions of Care (Post Inpatient/ED visit) call.   Signature Lewanda Rife, LPN

## 2023-01-07 NOTE — Transitions of Care (Post Inpatient/ED Visit) (Signed)
Unable to reach pt by phone and left v/m requesting cb [phone].     01/07/2023  Name: Joan [name] MRN: [id number] DOB: 1981-05-13  Today's TOC FU Call Status: Today's TOC FU Call Status:: Unsuccessful Call (2nd Attempt) Unsuccessful Call (1st Attempt) Date: 01/06/23 Unsuccessful Call (2nd Attempt) Date: 01/07/23  Attempted to reach the patient regarding the most recent Inpatient/ED visit.  Follow Up Plan: Additional outreach attempts will be made to reach the patient to complete the Transitions of Care (Post Inpatient/ED visit) call.   Signature  Lewanda Rife, LPN

## 2023-01-17 ENCOUNTER — Ambulatory Visit (HOSPITAL_COMMUNITY): Payer: No Typology Code available for payment source | Admitting: Licensed Clinical Social Worker

## 2023-01-29 ENCOUNTER — Other Ambulatory Visit: Payer: Self-pay

## 2023-01-29 ENCOUNTER — Encounter (HOSPITAL_BASED_OUTPATIENT_CLINIC_OR_DEPARTMENT_OTHER): Payer: Self-pay

## 2023-01-29 ENCOUNTER — Emergency Department (HOSPITAL_BASED_OUTPATIENT_CLINIC_OR_DEPARTMENT_OTHER)
Admission: EM | Admit: 2023-01-29 | Discharge: 2023-01-29 | Disposition: A | Discharge status: Home / Self Care | Payer: Self-pay | Attending: Emergency Medicine | Admitting: Emergency Medicine

## 2023-01-29 ENCOUNTER — Other Ambulatory Visit (HOSPITAL_BASED_OUTPATIENT_CLINIC_OR_DEPARTMENT_OTHER): Payer: Self-pay

## 2023-01-29 DIAGNOSIS — H9203 Otalgia, bilateral: Secondary | ICD-10-CM | POA: Insufficient documentation

## 2023-01-29 DIAGNOSIS — J45909 Unspecified asthma, uncomplicated: Secondary | ICD-10-CM | POA: Insufficient documentation

## 2023-01-29 DIAGNOSIS — J069 Acute upper respiratory infection, unspecified: Secondary | ICD-10-CM | POA: Insufficient documentation

## 2023-01-29 DIAGNOSIS — E119 Type 2 diabetes mellitus without complications: Secondary | ICD-10-CM | POA: Insufficient documentation

## 2023-01-29 DIAGNOSIS — Z79899 Other long term (current) drug therapy: Secondary | ICD-10-CM | POA: Insufficient documentation

## 2023-01-29 DIAGNOSIS — J3489 Other specified disorders of nose and nasal sinuses: Secondary | ICD-10-CM | POA: Insufficient documentation

## 2023-01-29 DIAGNOSIS — Z794 Long term (current) use of insulin: Secondary | ICD-10-CM | POA: Insufficient documentation

## 2023-01-29 DIAGNOSIS — I10 Essential (primary) hypertension: Secondary | ICD-10-CM | POA: Insufficient documentation

## 2023-01-29 MED ORDER — CEFDINIR 300 MG PO CAPS
300.0000 mg | ORAL_CAPSULE | Freq: Two times a day (BID) | ORAL | 0 refills | Status: DC
  Filled 2023-01-29: qty 20, 10d supply, fill #0

## 2023-01-29 NOTE — ED Provider Notes (Signed)
[facility] Provider Note   CSN: [id number] Arrival date & time: 01/29/23  6578     History  Chief Complaint  Patient presents with   Facial Pain    Joan [name] is a 42 y.o. female.  HPI   42 year old female presents emergency department with complaints of nonproductive cough, nasal congestion, bilateral ear pain, sinus pain.  Patient stated symptoms are present for the past 2 days.  Reports taking at home Benadryl at night which has helped minimally.  Presents emergency department for further assessment.  Patient states she has a history of recurrent ear infections and had to have "tubes placed" has undergone approximate 5 to 6 years ago.  States that she has not followed with ENT since then.  Also reports history of recurrent sinus infections and states this feels similarly.  Denies any known sick contact.  Denies any fever, chills, night sweats, chest pain, shortness of breath, abdominal pain, nausea, vomiting, urinary symptoms, change in bowel habits.  States that when she usually "gets like this" it requires antibiotics.  Past medical history significant for asthma, recurrent maxillary sinusitis, hypertension, diabetes mellitus type 2, hyperlipidemia, PTSD, MDD, GERD, GAD, bipolar 2 disorder, migraine  Home Medications Prior to Admission medications   Medication Sig Start Date End Date Taking? Authorizing Provider  albuterol (VENTOLIN HFA) 108 (90 Base) MCG/ACT inhaler Inhale 1 puff into the lungs every 4 (four) hours as needed for wheezing or shortness of breath. 04/23/17   [provider]  ARIPiprazole (ABILIFY) 5 MG tablet Take 1 tablet (5 mg total) by mouth daily. 10/28/22   Leevy-Johnson, Lina Sar, NP  atorvastatin (LIPITOR) 20 MG tablet Take 1 tablet (20 mg total) by mouth daily. for cholesterol. 01/01/23   Doreene Nest, NP  Dulaglutide (TRULICITY) 1.5 MG/0.5ML SOPN Inject 1.5 mg into the skin once a week. for  diabetes. 12/24/22   Doreene Nest, NP  hydrOXYzine (ATARAX) 25 MG tablet Take 1 tablet (25 mg total) by mouth 3 (three) times daily as needed for anxiety. 10/27/22   Leevy-Johnson, Lina Sar, NP  hydrOXYzine (ATARAX) 50 MG tablet Take 1 tablet (50 mg total) by mouth at bedtime. 10/27/22   Leevy-Johnson, Lina Sar, NP  insulin aspart protamine - aspart (NOVOLOG 70/30 FLEXPEN RELION) (70-30) 100 UNIT/ML FlexPen Inject 40 units into the skin every morning and 36 units every evening for diabetes. Patient taking differently: 50 Units 2 (two) times daily before a meal. 01/17/21   Doreene Nest, NP  propranolol ER (INDERAL LA) 80 MG 24 hr capsule Take 1 capsule (80 mg total) by mouth at bedtime. For headache prevention 11/29/22   Doreene Nest, NP  sertraline (ZOLOFT) 50 MG tablet Take 1 tablet (50 mg total) by mouth daily. for anxiety and depression. 11/29/22   Doreene Nest, NP  SUMAtriptan (IMITREX) 50 MG tablet Take 1 tablet by mouth at migraine onset. May repeat in 2 hours if headache persists or recurs. 11/29/22   Doreene Nest, NP  valsartan (DIOVAN) 80 MG tablet Take 1 tablet (80 mg total) by mouth daily. for blood pressure. 09/26/22   Doreene Nest, NP      Allergies    Ibuprofen, Ciprofloxacin, Metformin and related, Penicillins, Sulfa antibiotics, Other, Shellfish allergy, and Trazodone and nefazodone    Review of Systems   Review of Systems  Physical Exam Updated Vital Signs BP (!) 143/97 (BP Location: Right Arm)   Pulse (!) 106  Temp 97.6 F (36.4 C) (Oral)   Resp 18   Ht 5\' 6"  (1.676 m)   Wt 129.3 kg   LMP 07/24/2018 (Exact Date) Comment: surgery 07/31/2018  SpO2 98%   BMI 46.00 kg/m  Physical Exam Vitals and nursing note reviewed.  Constitutional:      General: She is not in acute distress.    Appearance: She is well-developed.  HENT:     Head: Normocephalic and atraumatic.     Comments: Mild tenderness to palpation of maxillary and frontal  sinuses.    Right Ear: Ear canal and external ear normal. There is no impacted cerumen.     Left Ear: Ear canal and external ear normal. There is no impacted cerumen.     Ears:     Comments: Patient without erythema bilateral TM or external auditory canal.  Serous fluid level appreciated bilaterally.    Nose: Nose normal.     Mouth/Throat:     Mouth: Mucous membranes are moist.     Pharynx: Oropharynx is clear. No oropharyngeal exudate or posterior oropharyngeal erythema.  Eyes:     General: No scleral icterus.       Right eye: No discharge.        Left eye: No discharge.     Extraocular Movements: Extraocular movements intact.     Conjunctiva/sclera: Conjunctivae normal.     Pupils: Pupils are equal, round, and reactive to light.  Cardiovascular:     Rate and Rhythm: Normal rate and regular rhythm.     Heart sounds: No murmur heard. Pulmonary:     Effort: Pulmonary effort is normal. No respiratory distress.     Breath sounds: Normal breath sounds. No wheezing, rhonchi or rales.  Abdominal:     Palpations: Abdomen is soft.     Tenderness: There is no abdominal tenderness.  Musculoskeletal:        General: No swelling.     Cervical back: Neck supple.  Skin:    General: Skin is warm and dry.     Capillary Refill: Capillary refill takes less than 2 seconds.  Neurological:     Mental Status: She is alert.     Motor: No weakness.  Psychiatric:        Mood and Affect: Mood normal.     ED Results / Procedures / Treatments   Labs (all labs ordered are listed, but only abnormal results are displayed) Labs Reviewed - No data to display  EKG None  Radiology No results found.  Procedures Procedures    Medications Ordered in ED Medications - No data to display  ED Course/ Medical Decision Making/ A&P                             Medical Decision Making  This patient presents to the ED for concern of bilateral ear pain, sinus pain, this involves an extensive number of  treatment options, and is a complaint that carries with it a high risk of complications and morbidity.  The differential diagnosis includes sinusitis, otitis media, otitis externa, viral URI   Co morbidities that complicate the patient evaluation  See HPI   Additional history obtained:  Additional history obtained from EMR External records from outside source obtained and reviewed including hospital records   Lab Tests:  Patient declined viral testing  Imaging Studies ordered:  N/a   Cardiac Monitoring: / EKG:  The patient was maintained on a cardiac monitor.  I personally viewed and interpreted the cardiac monitored which showed an underlying rhythm of: Sinus rhythm   Consultations Obtained:  N/a   Problem List / ED Course / Critical interventions / Medication management  Viral URI Reevaluation of the patient showed that the patient stayed the same I have reviewed the patients home medicines and have made adjustments as needed   Social Determinants of Health:  Denies tobacco, illicit use   Test / Admission - Considered:  Viral URI Vitals signs within normal range and stable throughout visit. 42 year old female presents emergency department with 2-day history of bilateral ear pressure.  Regarding patient's ears, without evidence of otitis media/otitis externa or cellulitic skin changes externally of ear.  Patient was with serous fluid level bilateral TM most likely causing patient's symptoms.  Regarding sinus pain, patient with 2-day history of sinus pain.  Symptoms likely secondary to viral illness.  Offered patient viral testing at this time but patient declined.  Will recommend symptomatic therapy at home with antihistamine, blood pressure apparently decongestion in the form of Coricidin as well as follow-up with ENT in the outpatient setting due to history of recurrent ear infections.  Treatment plan discussed at length with patient and she acknowledged  understanding was agreeable to the plan.  Patient was overall well-appearing, afebrile in no acute distress. Worrisome signs and symptoms were discussed with the patient, and the patient acknowledged understanding to return to the ED if noticed. Patient was stable upon discharge.          Final Clinical Impression(s) / ED Diagnoses Final diagnoses:  Pain of maxillary sinus  Ear pain, bilateral    Rx / DC Orders ED Discharge Orders     None         Peter Garter, [place] 01/29/23 1022    Virgina Norfolk, DO 01/29/23 1030

## 2023-01-29 NOTE — ED Triage Notes (Signed)
C/o sinus pressure, bilateral ear pain and aches x 2 days. Low grade fevers.

## 2023-01-29 NOTE — Discharge Instructions (Addendum)
As discussed, I suspect your symptoms are likely secondary to viral illness.  Recommend symptomatic therapy at home with continued use of allergy medicine such as Claritin/Zyrtec/Allegra during the day; he may continue use Benadryl at night as needed.  Recommend over-the-counter decongestion in the form of Coricidin given your history of elevated blood pressure.  Also recommend follow-up with ENT in the outpatient setting for reassessment of your symptoms.  Please do not hesitate to return to emergency department for worrisome signs and symptoms we discussed become apparent.

## 2023-01-30 ENCOUNTER — Telehealth: Payer: Self-pay

## 2023-01-30 NOTE — Transitions of Care (Post Inpatient/ED Visit) (Signed)
   01/30/2023  Name: Joan [name] MRN: [id number] DOB: Jul 10, 1981  Today's TOC FU Call Status: Unsuccessful Call (1st Attempt) Date: 01/30/23  Attempted to reach the patient regarding the most recent Inpatient/ED visit.  Follow Up Plan: Additional outreach attempts will be made to reach the patient to complete the Transitions of Care (Post Inpatient/ED visit) call.   Signature   Woodfin Ganja LPN [facility] Nurse Health Advisor Direct Dial [phone]

## 2023-01-31 ENCOUNTER — Ambulatory Visit: Payer: BC Managed Care – PPO | Admitting: Primary Care

## 2023-02-03 NOTE — Transitions of Care (Post Inpatient/ED Visit) (Signed)
   02/03/2023  Name: CHARYL TOMASSETTI MRN: [id number] DOB: 1981-04-07  Today's TOC FU Call Status: Today's TOC FU Call Status:: Successful TOC FU Call Competed Unsuccessful Call (1st Attempt) Date: 01/30/23 Unsuccessful Call (2nd Attempt) Date: 02/03/23 [facility] FU Call Complete Date: 02/03/23  Attempted to reach the patient regarding the most recent Inpatient/ED visit.  Follow Up Plan: No further outreach attempts will be made at this time. We have been unable to contact the patient. Pt has fu appt 02-04-23 at 740am Signature   Woodfin Ganja LPN [facility] [facility] Nurse Health Advisor Direct Dial [phone]

## 2023-02-04 ENCOUNTER — Ambulatory Visit: Payer: Self-pay | Admitting: Primary Care

## 2023-02-05 ENCOUNTER — Encounter: Payer: Self-pay | Admitting: Primary Care

## 2023-02-11 ENCOUNTER — Other Ambulatory Visit (HOSPITAL_BASED_OUTPATIENT_CLINIC_OR_DEPARTMENT_OTHER): Payer: Self-pay

## 2023-02-24 ENCOUNTER — Other Ambulatory Visit: Payer: Self-pay | Admitting: Primary Care

## 2023-02-24 DIAGNOSIS — F411 Generalized anxiety disorder: Secondary | ICD-10-CM

## 2023-02-24 DIAGNOSIS — F332 Major depressive disorder, recurrent severe without psychotic features: Secondary | ICD-10-CM

## 2023-02-24 DIAGNOSIS — G43009 Migraine without aura, not intractable, without status migrainosus: Secondary | ICD-10-CM

## 2023-02-24 NOTE — Telephone Encounter (Signed)
Please call patient:  Received refill request for Zoloft. During our visit in April she was going to reschedule with her psychiatrist. Did she do that?  Also received refill request for propranolol medication that we started in April for headache/migraine prevention. Is the propranolol medication working?   She is due for diabetes follow up. Please schedule.

## 2023-02-25 NOTE — Telephone Encounter (Signed)
Unable to reach patient. Left voicemail to return call to our office.   

## 2023-02-26 NOTE — Telephone Encounter (Signed)
Noted. Refill(s) sent to pharmacy.  

## 2023-02-26 NOTE — Telephone Encounter (Signed)
Unable to reach patient. Left voicemail to return call to our office.   

## 2023-02-26 NOTE — Telephone Encounter (Signed)
Patient did not make appointment with psychology due to insurance. She also states she will need to hold off on setting up follow up with our office for a few months until she gets insurance back.   She states that propranolol was helpful

## 2023-03-08 ENCOUNTER — Other Ambulatory Visit: Payer: Self-pay

## 2023-03-08 ENCOUNTER — Emergency Department (HOSPITAL_BASED_OUTPATIENT_CLINIC_OR_DEPARTMENT_OTHER)
Admission: EM | Admit: 2023-03-08 | Discharge: 2023-03-08 | Disposition: A | Discharge status: Home / Self Care | Payer: Self-pay | Attending: Emergency Medicine | Admitting: Emergency Medicine

## 2023-03-08 ENCOUNTER — Emergency Department (HOSPITAL_BASED_OUTPATIENT_CLINIC_OR_DEPARTMENT_OTHER): Payer: Self-pay

## 2023-03-08 ENCOUNTER — Encounter (HOSPITAL_BASED_OUTPATIENT_CLINIC_OR_DEPARTMENT_OTHER): Payer: Self-pay

## 2023-03-08 DIAGNOSIS — Z794 Long term (current) use of insulin: Secondary | ICD-10-CM | POA: Insufficient documentation

## 2023-03-08 DIAGNOSIS — U071 COVID-19: Secondary | ICD-10-CM | POA: Insufficient documentation

## 2023-03-08 DIAGNOSIS — J45901 Unspecified asthma with (acute) exacerbation: Secondary | ICD-10-CM | POA: Insufficient documentation

## 2023-03-08 MED ORDER — ALBUTEROL SULFATE HFA 108 (90 BASE) MCG/ACT IN AERS
2.0000 | INHALATION_SPRAY | Freq: Once | RESPIRATORY_TRACT | Status: AC
  Administered 2023-03-08: 2 via RESPIRATORY_TRACT
  Filled 2023-03-08: qty 6.7

## 2023-03-08 MED ORDER — PREDNISONE 20 MG PO TABS
40.0000 mg | ORAL_TABLET | Freq: Once | ORAL | Status: AC
  Administered 2023-03-08: 40 mg via ORAL
  Filled 2023-03-08: qty 2

## 2023-03-08 MED ORDER — PREDNISONE 10 MG PO TABS: 20.0000 mg | ORAL_TABLET | Freq: Every day | ORAL | 0 refills | Status: AC

## 2023-03-08 NOTE — ED Triage Notes (Signed)
Tested positive for Covid on Monday and now is nauseated and feels like "it is in my chest".

## 2023-03-08 NOTE — Discharge Instructions (Signed)
Recommend 4 puffs of your albuterol inhaler every 4-6 hours.  Take next dose of prednisone tomorrow.

## 2023-03-08 NOTE — ED Provider Notes (Signed)
[facility] Provider Note   CSN: [id number] Arrival date & time: 03/08/23  1503     History  Chief Complaint  Patient presents with   Cough    Joan [name] is a 42 y.o. female.  Patient here with cough, congestion.  Diagnosed with COVID a few days ago.  History of asthma.  Concern for pneumonia.  Denies any fever or chills otherwise.  Has been using inhaler.  Denies any shortness of breath, nausea vomiting abdominal pain or diarrhea.  The history is provided by the patient.       Home Medications Prior to Admission medications   Medication Sig Start Date End Date Taking? Authorizing Provider  predniSONE (DELTASONE) 10 MG tablet Take 2 tablets (20 mg total) by mouth daily for 3 days. 03/08/23 03/11/23 Yes , , DO  albuterol (VENTOLIN HFA) 108 (90 Base) MCG/ACT inhaler Inhale 1 puff into the lungs every 4 (four) hours as needed for wheezing or shortness of breath. 04/23/17   [provider]  ARIPiprazole (ABILIFY) 5 MG tablet Take 1 tablet (5 mg total) by mouth daily. 10/28/22   Leevy-Johnson, Lina Sar, NP  atorvastatin (LIPITOR) 20 MG tablet Take 1 tablet (20 mg total) by mouth daily. for cholesterol. 01/01/23   Doreene Nest, NP  cefdinir (OMNICEF) 300 MG capsule Take 1 capsule (300 mg total) by mouth 2 (two) times daily. 01/29/23   Peter Garter, PA  Dulaglutide (TRULICITY) 1.5 MG/0.5ML SOPN Inject 1.5 mg into the skin once a week. for diabetes. 12/24/22   Doreene Nest, NP  hydrOXYzine (ATARAX) 25 MG tablet Take 1 tablet (25 mg total) by mouth 3 (three) times daily as needed for anxiety. 10/27/22   Leevy-Johnson, Lina Sar, NP  hydrOXYzine (ATARAX) 50 MG tablet Take 1 tablet (50 mg total) by mouth at bedtime. 10/27/22   Leevy-Johnson, Lina Sar, NP  insulin aspart protamine - aspart (NOVOLOG 70/30 FLEXPEN RELION) (70-30) 100 UNIT/ML FlexPen Inject 40 units into the skin every morning and 36 units every evening  for diabetes. Patient taking differently: 50 Units 2 (two) times daily before a meal. 01/17/21   Doreene Nest, NP  propranolol ER (INDERAL LA) 80 MG 24 hr capsule TAKE 1 CAPSULE(80 MG) BY MOUTH AT BEDTIME FOR HEADACHE PREVENTION 02/26/23   Doreene Nest, NP  sertraline (ZOLOFT) 50 MG tablet TAKE 1 TABLET(50 MG) BY MOUTH DAILY FOR ANXIETY OR DEPRESSION 02/26/23   Doreene Nest, NP  SUMAtriptan (IMITREX) 50 MG tablet Take 1 tablet by mouth at migraine onset. May repeat in 2 hours if headache persists or recurs. 11/29/22   Doreene Nest, NP  valsartan (DIOVAN) 80 MG tablet Take 1 tablet (80 mg total) by mouth daily. for blood pressure. 09/26/22   Doreene Nest, NP      Allergies    Ibuprofen, Ciprofloxacin, Metformin and related, Penicillins, Sulfa antibiotics, Other, Shellfish allergy, and Trazodone and nefazodone    Review of Systems   Review of Systems  Physical Exam Updated Vital Signs BP (!) 117/93 (BP Location: Left Arm)   Pulse (!) 109   Temp 97.6 F (36.4 C) (Oral)   Resp 20   Ht 5\' 6"  (1.676 m)   Wt 126.6 kg   LMP 07/24/2018 (Exact Date) Comment: surgery 07/31/2018  SpO2 99%   BMI 45.03 kg/m  Physical Exam Vitals and nursing note reviewed.  Constitutional:      General: She is not in acute  distress.    Appearance: She is well-developed.  HENT:     Head: Normocephalic and atraumatic.     Nose: Nose normal.     Mouth/Throat:     Mouth: Mucous membranes are moist.  Eyes:     Extraocular Movements: Extraocular movements intact.     Conjunctiva/sclera: Conjunctivae normal.     Pupils: Pupils are equal, round, and reactive to light.  Cardiovascular:     Rate and Rhythm: Normal rate and regular rhythm.     Pulses: Normal pulses.     Heart sounds: Normal heart sounds. No murmur heard. Pulmonary:     Effort: Pulmonary effort is normal. No respiratory distress.     Breath sounds: Wheezing present.  Abdominal:     Palpations: Abdomen is soft.      Tenderness: There is no abdominal tenderness.  Musculoskeletal:        General: No swelling.     Cervical back: Neck supple.  Skin:    General: Skin is warm and dry.     Capillary Refill: Capillary refill takes less than 2 seconds.  Neurological:     Mental Status: She is alert.  Psychiatric:        Mood and Affect: Mood normal.     ED Results / Procedures / Treatments   Labs (all labs ordered are listed, but only abnormal results are displayed) Labs Reviewed - No data to display  EKG None  Radiology DG Chest Portable 1 View  Result Date: 03/08/2023 CLINICAL DATA:  COVID positive. EXAM: PORTABLE CHEST 1 VIEW COMPARISON:  Chest x-ray dated August 26, 2022. FINDINGS: The heart size and mediastinal contours are within normal limits. Both lungs are clear. The visualized skeletal structures are unremarkable. IMPRESSION: No active disease. Electronically Signed   By: Obie Dredge M.D.   On: 03/08/2023 15:35    Procedures Procedures    Medications Ordered in ED Medications  predniSONE (DELTASONE) tablet 40 mg (has no administration in time range)  albuterol (VENTOLIN HFA) 108 (90 Base) MCG/ACT inhaler 2 puff (has no administration in time range)    ED Course/ Medical Decision Making/ A&P                                 Medical Decision Making Amount and/or Complexity of Data Reviewed Radiology: ordered.  Risk Prescription drug management.   Joan [name] is here with cough.  Unremarkable vitals.  Diagnosed with COVID a few days ago.  History of asthma.  Mild wheezing on exam.  No signs of respiratory distress.  She is having a harsh cough.  X-ray showed no evidence of pneumonia or pneumothorax per my review and interpretation as well as radiology report.  I have no concern for other emergent process.  I think she has a mild asthma exacerbation likely in the setting of COVID.  Will prescribe her albuterol and prednisone.  Discharged in good condition.  Understands return  precautions.  This chart was dictated using voice recognition software.  Despite best efforts to proofread,  errors can occur which can change the documentation meaning.         Final Clinical Impression(s) / ED Diagnoses Final diagnoses:  Exacerbation of asthma, unspecified asthma severity, unspecified whether persistent  COVID    Rx / DC Orders ED Discharge Orders          Ordered    predniSONE (DELTASONE) 10 MG tablet  Daily  03/08/23 1602              Virgina Norfolk, DO 03/08/23 1603

## 2023-03-10 ENCOUNTER — Telehealth: Payer: Self-pay

## 2023-03-10 NOTE — Transitions of Care (Post Inpatient/ED Visit) (Signed)
   03/10/2023  Name: MAGGY RONES MRN: [id number] DOB: [birth year]  Today's TOC FU Call Status: Today's TOC FU Call Status:: Successful TOC FU Call Completed TOC FU Call Complete Date: 03/10/23  Transition Care Management Follow-up Telephone Call Date of Discharge: 03/08/23 Discharge Facility: [facility] Type of Discharge: Emergency Department Reason for ED Visit: Respiratory Respiratory Diagnosis: Asthma (Uncontrolled) How have you been since you were released from the hospital?: Same Any questions or concerns?: No  Items Reviewed: Did you receive and understand the discharge instructions provided?: Yes Medications obtained,verified, and reconciled?: Yes (Medications Reviewed) Any new allergies since your discharge?: No Dietary orders reviewed?: NA Do you have support at home?: Yes People in Home: spouse  Medications Reviewed Today: Medications Reviewed Today   Medications were not reviewed in this encounter     Home Care and Equipment/Supplies: Were Home Health Services Ordered?: NA Any new equipment or medical supplies ordered?: NA  Functional Questionnaire: Do you need assistance with bathing/showering or dressing?: No Do you need assistance with meal preparation?: No Do you need assistance with eating?: No Do you have difficulty maintaining continence: No Do you need assistance with getting out of bed/getting out of a chair/moving?: No Do you have difficulty managing or taking your medications?: No  Follow up appointments reviewed: PCP Follow-up appointment confirmed?: No (declined visit at this time) Specialist Hospital Follow-up appointment confirmed?: NA Do you need transportation to your follow-up appointment?: No Do you understand care options if your condition(s) worsen?: Yes-patient verbalized understanding    SIGNATURE Elisha Ponder LPN [facility] AWV Team Direct dial:  [phone]

## 2023-03-13 ENCOUNTER — Encounter: Payer: Self-pay | Admitting: Primary Care

## 2023-03-13 ENCOUNTER — Ambulatory Visit (INDEPENDENT_AMBULATORY_CARE_PROVIDER_SITE_OTHER): Payer: Self-pay | Admitting: Primary Care

## 2023-03-13 VITALS — BP 132/68 | HR 90 | Temp 96.7°F | Ht 66.0 in | Wt 289.0 lb

## 2023-03-13 DIAGNOSIS — R051 Acute cough: Secondary | ICD-10-CM | POA: Insufficient documentation

## 2023-03-13 MED ORDER — HYDROCOD POLI-CHLORPHE POLI ER 10-8 MG/5ML PO SUER
5.0000 mL | Freq: Two times a day (BID) | ORAL | 0 refills | Status: DC | PRN
Start: 2023-03-13 — End: 2023-05-01

## 2023-03-13 MED ORDER — AZITHROMYCIN 250 MG PO TABS: ORAL_TABLET | ORAL | 0 refills | Status: DC

## 2023-03-13 NOTE — Assessment & Plan Note (Addendum)
Differentials include postviral cough versus asthma versus bacterial infection.  Given duration of symptoms, coupled with presentation, will treat with antibiotics.  Start Azithromycin antibiotics for infection. Take 2 tablets by mouth today, then 1 tablet daily for 4 additional days.  Continue albuterol inhaler as needed.  ED notes and imaging reviewed from August 2024 Work note provided.
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
## 2023-03-13 NOTE — Patient Instructions (Addendum)
Start Azithromycin antibiotics for infection. Take 2 tablets by mouth today, then 1 tablet daily for 4 additional days.  You may take the cough suppressant every 12 hours as needed for cough and rest. Caution this medication contains codeine which may cause drowsiness.   It was a pleasure to see you today!

## 2023-03-20 ENCOUNTER — Telehealth: Payer: Self-pay

## 2023-03-20 NOTE — Transitions of Care (Post Inpatient/ED Visit) (Signed)
03/20/2023  Name: Joan [name] MRN: [id number] DOB: 03-04-1981  Today's TOC FU Call Status: Today's TOC FU Call Status:: Successful TOC FU Call Completed TOC FU Call Complete Date: 03/20/23  Transition Care Management Follow-up Telephone Call Date of Discharge: 03/19/23 Discharge Facility: Other Recruitment consultant Facility) Name of Other (Non-[facility]) Discharge Facility: [facility] Type of Discharge: Emergency Department Reason for ED Visit: Orthopedic Conditions Orthopedic/Injury Diagnosis: Sprain or Strain (Insertional tendinopathy of left Achilles tendon) How have you been since you were released from the hospital?: Better Any questions or concerns?: Yes Patient Questions/Concerns:: (S) still in a lot of pain- was not given any pain meds because she is taking the cough syrup with codeine in it- she is taking tylenol with no pain relief- using ice- and elevating it -little swollen Patient Questions/Concerns Addressed: (S) Notified Provider of Patient Questions/Concerns  Items Reviewed: Did you receive and understand the discharge instructions provided?: Yes Medications obtained,verified, and reconciled?: Yes (Medications Reviewed) Any new allergies since your discharge?: No Dietary orders reviewed?: Yes Do you have support at home?: Yes  Medications Reviewed Today: Medications Reviewed Today     Reviewed by Merleen Nicely, LPN (Licensed Practical Nurse) on 03/20/23 at 1343  Med List Status: <None>   Medication Order Taking? Sig Documenting Provider Last Dose Status Informant  albuterol (VENTOLIN HFA) 108 (90 Base) MCG/ACT inhaler [id number] Yes Inhale 1 puff into the lungs every 4 (four) hours as needed for wheezing or shortness of breath. [provider] Taking Active Multiple Informants  ARIPiprazole (ABILIFY) 5 MG tablet [id number] No Take 1 tablet (5 mg total) by mouth daily.  Patient not taking: Reported on 03/13/2023   Loletta Parish, NP Not Taking Active    atorvastatin (LIPITOR) 20 MG tablet [id number] Yes Take 1 tablet (20 mg total) by mouth daily. for cholesterol. Doreene Nest, NP Taking Active   azithromycin (ZITHROMAX) 250 MG tablet [id number] No Take 2 tablets by mouth today, then 1 tablet daily for 4 additional days.  Patient not taking: Reported on 03/20/2023   Doreene Nest, NP Not Taking Active   cefdinir (OMNICEF) 300 MG capsule [id number] No Take 1 capsule (300 mg total) by mouth 2 (two) times daily.  Patient not taking: Reported on 03/13/2023   Peter Garter, PA Not Taking Active   chlorpheniramine-HYDROcodone (TUSSIONEX) 10-8 MG/5ML [id number] Yes Take 5 mLs by mouth every 12 (twelve) hours as needed. Doreene Nest, NP Taking Active   Dulaglutide (TRULICITY) 1.5 MG/0.5ML SOPN [id number] No Inject 1.5 mg into the skin once a week. for diabetes.  Patient not taking: Reported on 03/13/2023   Doreene Nest, NP Not Taking Active   hydrOXYzine (ATARAX) 25 MG tablet [id number] No Take 1 tablet (25 mg total) by mouth 3 (three) times daily as needed for anxiety.  Patient not taking: Reported on 03/20/2023   Loletta Parish, NP Not Taking Active   hydrOXYzine (ATARAX) 50 MG tablet [id number] No Take 1 tablet (50 mg total) by mouth at bedtime.  Patient not taking: Reported on 03/13/2023   Loletta Parish, NP Not Taking Active   insulin aspart protamine - aspart (NOVOLOG 70/30 FLEXPEN RELION) (70-30) 100 UNIT/ML FlexPen [id number] Yes Inject 40 units into the skin every morning and 36 units every evening for diabetes.  Patient taking differently: 50 Units 2 (two) times daily before a meal.   Doreene Nest, NP Taking Active Multiple Informants           Med  Note Julieanne Manson,  H   Tue Sep 17, 2022  1:14 PM) Pt taking 50units BID   propranolol ER (INDERAL LA) 80 MG 24 hr capsule [id number] Yes TAKE 1 CAPSULE(80 MG) BY MOUTH AT BEDTIME FOR HEADACHE PREVENTION Doreene Nest, NP Taking Active   sertraline  (ZOLOFT) 50 MG tablet [id number] Yes TAKE 1 TABLET(50 MG) BY MOUTH DAILY FOR ANXIETY OR DEPRESSION Doreene Nest, NP Taking Active   SUMAtriptan (IMITREX) 50 MG tablet [id number] Yes Take 1 tablet by mouth at migraine onset. May repeat in 2 hours if headache persists or recurs. Doreene Nest, NP Taking Active   valsartan (DIOVAN) 80 MG tablet [id number] Yes Take 1 tablet (80 mg total) by mouth daily. for blood pressure. Doreene Nest, NP Taking Active Multiple Informants            Home Care and Equipment/Supplies: Were Home Health Services Ordered?: NA Any new equipment or medical supplies ordered?: Yes Name of Medical supply agency?: hospital Were you able to get the equipment/medical supplies?: Yes (crutches and a shoe) Do you have any questions related to the use of the equipment/supplies?: No  Functional Questionnaire: Do you need assistance with bathing/showering or dressing?: Yes Do you need assistance with meal preparation?: No Do you need assistance with eating?: No Do you have difficulty maintaining continence: No Do you need assistance with getting out of bed/getting out of a chair/moving?: Yes Do you have difficulty managing or taking your medications?: No  Follow up appointments reviewed: PCP Follow-up appointment confirmed?: Yes Date of PCP follow-up appointment?: 03/21/23 Follow-up Provider: [facility] Follow-up appointment confirmed?: Yes (pt wants appt with ortho in [facility]) Do you need transportation to your follow-up appointment?: No Do you understand care options if your condition(s) worsen?: Yes-patient verbalized understanding    SIGNATURE  Woodfin Ganja LPN [facility] Nurse Health Advisor Direct Dial [phone]

## 2023-03-21 ENCOUNTER — Inpatient Hospital Stay: Payer: Self-pay | Admitting: Primary Care

## 2023-05-01 ENCOUNTER — Ambulatory Visit: Payer: Self-pay | Admitting: Primary Care

## 2023-05-01 ENCOUNTER — Encounter: Payer: Self-pay | Admitting: Primary Care

## 2023-05-01 VITALS — BP 148/92 | HR 85 | Temp 97.0°F | Ht 66.0 in | Wt 305.0 lb

## 2023-05-01 DIAGNOSIS — F332 Major depressive disorder, recurrent severe without psychotic features: Secondary | ICD-10-CM

## 2023-05-01 DIAGNOSIS — H1031 Unspecified acute conjunctivitis, right eye: Secondary | ICD-10-CM

## 2023-05-01 DIAGNOSIS — E1165 Type 2 diabetes mellitus with hyperglycemia: Secondary | ICD-10-CM

## 2023-05-01 DIAGNOSIS — G43009 Migraine without aura, not intractable, without status migrainosus: Secondary | ICD-10-CM | POA: Diagnosis not present

## 2023-05-01 DIAGNOSIS — I10 Essential (primary) hypertension: Secondary | ICD-10-CM

## 2023-05-01 DIAGNOSIS — F411 Generalized anxiety disorder: Secondary | ICD-10-CM | POA: Diagnosis not present

## 2023-05-01 DIAGNOSIS — E785 Hyperlipidemia, unspecified: Secondary | ICD-10-CM

## 2023-05-01 HISTORY — DX: Unspecified acute conjunctivitis, right eye: H10.31

## 2023-05-01 LAB — POCT GLYCOSYLATED HEMOGLOBIN (HGB A1C): Hemoglobin A1C: 14.9 % — AB (ref 4.0–5.6)

## 2023-05-01 MED ORDER — SUMATRIPTAN SUCCINATE 50 MG PO TABS
ORAL_TABLET | ORAL | 0 refills | Status: DC
Start: 2023-05-01 — End: 2024-05-06

## 2023-05-01 MED ORDER — TIRZEPATIDE 2.5 MG/0.5ML ~~LOC~~ SOAJ: 2.5000 mg | SUBCUTANEOUS | 0 refills | Status: DC

## 2023-05-01 MED ORDER — VALSARTAN 80 MG PO TABS: 80.0000 mg | ORAL_TABLET | Freq: Every day | ORAL | 1 refills | Status: DC

## 2023-05-01 MED ORDER — HYDROXYZINE PAMOATE 25 MG PO CAPS
25.0000 mg | ORAL_CAPSULE | Freq: Every day | ORAL | 1 refills | Status: DC
Start: 2023-05-01 — End: 2023-07-31

## 2023-05-01 MED ORDER — ATORVASTATIN CALCIUM 20 MG PO TABS: 20.0000 mg | ORAL_TABLET | Freq: Every day | ORAL | 1 refills | Status: DC

## 2023-05-01 MED ORDER — INSULIN GLARGINE 100 UNIT/ML SOLOSTAR PEN
50.0000 [IU] | PEN_INJECTOR | Freq: Every day | SUBCUTANEOUS | 0 refills | Status: DC
Start: 2023-05-01 — End: 2023-06-30

## 2023-05-01 MED ORDER — ERYTHROMYCIN 5 MG/GM OP OINT: TOPICAL_OINTMENT | OPHTHALMIC | 0 refills | Status: DC

## 2023-05-01 MED ORDER — PROPRANOLOL HCL ER 80 MG PO CP24: 80.0000 mg | ORAL_CAPSULE | Freq: Every day | ORAL | 1 refills | Status: DC

## 2023-05-01 MED ORDER — PEN NEEDLES 31G X 6 MM MISC: 3 refills | Status: DC

## 2023-05-01 MED ORDER — DEXCOM G7 SENSOR MISC: 3 refills | Status: DC

## 2023-05-01 MED ORDER — SERTRALINE HCL 50 MG PO TABS
50.0000 mg | ORAL_TABLET | Freq: Every day | ORAL | 1 refills | Status: DC
Start: 2023-05-01 — End: 2023-05-26

## 2023-05-01 NOTE — Assessment & Plan Note (Signed)
Uncontrolled with A1c of 14.9 today.  Stop NovoLog 70/30 50 units twice daily as she is experiencing hypoglycemic episodes during the night.  Start Lantus 50 units daily.  Start tirzepitide Greggory Keen) for diabetes/weight loss. Start by injecting 2.5 mg into the skin once weekly for 4 weeks, then increase to 5 mg once weekly thereafter.   Foot exam today.  I have asked that she send glucose readings via MyChart in 2 weeks. Close follow-up in 3 months.

## 2023-05-01 NOTE — Assessment & Plan Note (Signed)
Uncontrolled.  Resume Zoloft 50 mg daily.  Prescription refills provided. Resume hydroxyzine 25 mg at bedtime for sleep and anxiety.  Referral was placed for psychology and psychiatry.  Follow-up in 3 months.

## 2023-05-01 NOTE — Assessment & Plan Note (Signed)
Uncontrolled.  Resume propranolol ER 80 mg at bedtime for headache prevention. Continue sumatriptan 50 mg as needed.
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
## 2023-05-01 NOTE — Assessment & Plan Note (Signed)
Presumed infectious based on HPI and exam today.  Multiple drug allergies.  Start erythromycin ointment, 0.5 inch application 4 times daily.

## 2023-05-01 NOTE — Patient Instructions (Signed)
Stop taking NovoLog 70/30 insulin.  Start taking Lantus 50 units once daily for insulin.  Start tirzepitide Greggory Keen) for diabetes/weight loss. Start by injecting 2.5 mg into the skin once weekly for 4 weeks, then increase to 5 mg once weekly thereafter. Please notify me once you've used your last 2.5 mg pen so that I can prescribe the next dose.   Resume your Zoloft 50 mg daily for depression/anxiety. You may take hydroxyzine 25 mg at night for sleep.  You will either be contacted via phone regarding your referral to psychiatry and therapy, or you may receive a letter on your MyChart portal from our referral team with instructions for scheduling an appointment. Please let us know if you have not been contacted by anyone within two weeks.  Resume propranolol ER 80 mg at bedtime for migraine/headache prevention. Use the Imitrex as needed for bad migraines.  Please send a blood sugar readings via MyChart in 2 weeks.  Please schedule a follow up visit for 3 months.  It was a pleasure to see you today!

## 2023-05-01 NOTE — Assessment & Plan Note (Signed)
Uncontrolled.  Resume valsartan 80 mg daily.

## 2023-05-01 NOTE — Assessment & Plan Note (Signed)
Resume atorvastatin 20 mg daily.  Repeat lipids at next visit.

## 2023-05-02 ENCOUNTER — Telehealth: Payer: Self-pay | Admitting: Primary Care

## 2023-05-02 ENCOUNTER — Other Ambulatory Visit: Payer: Self-pay

## 2023-05-02 DIAGNOSIS — E1165 Type 2 diabetes mellitus with hyperglycemia: Secondary | ICD-10-CM

## 2023-05-02 MED ORDER — DEXCOM G7 RECEIVER DEVI
0 refills | Status: AC
Start: 2023-05-02 — End: ?

## 2023-05-02 NOTE — Telephone Encounter (Signed)
Pt called stating she needs a PA for Continuous Glucose Receiver (DEXCOM G7 RECEIVER) DEVI & Continuous Glucose Sensor (DEXCOM G7 SENSOR) MISC also. Call back # [phone]

## 2023-05-02 NOTE — Addendum Note (Signed)
Addended by: Doreene Nest on: 05/02/2023 01:38 PM   Modules accepted: Orders

## 2023-05-02 NOTE — Telephone Encounter (Signed)
Checked on [employer] below is how they recommend that you prescribe

## 2023-05-02 NOTE — Telephone Encounter (Signed)
Patient called in and stated that she needs a prior auth for her tirzepatide [facility]) 2.5 MG/0.5ML Pen. Thank you!

## 2023-05-02 NOTE — Telephone Encounter (Signed)
Can we call the pharmacy to find out what she is needing?  Is this the receiver?  Or transmitter?  She has a Cytogeneticist.

## 2023-05-02 NOTE — Telephone Encounter (Signed)
Notify patient that I sent the receiver device to her pharmacy so she should be able to start using the Dexcom.

## 2023-05-02 NOTE — Telephone Encounter (Signed)
Patient called in and stated that she is needing a Dexcom starter kit. She has no way of putting the sensors on. Thank you!

## 2023-05-02 NOTE — Telephone Encounter (Signed)
Called and left message advising patient of Isabella Stalling message. Advised patient to callback if she has any questions.

## 2023-05-07 ENCOUNTER — Telehealth: Payer: Self-pay

## 2023-05-07 ENCOUNTER — Other Ambulatory Visit (HOSPITAL_COMMUNITY): Payer: Self-pay

## 2023-05-07 NOTE — Telephone Encounter (Signed)
Pharmacy Patient Advocate Encounter   Received notification from Pt Calls Messages that prior authorization for Mounjaro and Dexcom G7 is required/requested.   Insurance verification completed.   The patient is insured through [facility] .   Mounjaro:  Per test claim: PA required; PA started via CoverMyMeds. KEY [id number] . Waiting for clinical questions to populate.  Dexcom G7 supplies Per test claim: PA required; PA started via CoverMyMeds. KEY [id number] . Waiting for clinical questions to populate.

## 2023-05-07 NOTE — Telephone Encounter (Signed)
PA requests have been Submitted. New Encounter created for follow up. For additional info see Pharmacy Prior Auth telephone encounter from 05/07/2023.

## 2023-05-08 ENCOUNTER — Telehealth: Payer: Self-pay | Admitting: Primary Care

## 2023-05-08 ENCOUNTER — Other Ambulatory Visit (HOSPITAL_COMMUNITY): Payer: Self-pay

## 2023-05-08 DIAGNOSIS — E1165 Type 2 diabetes mellitus with hyperglycemia: Secondary | ICD-10-CM

## 2023-05-08 MED ORDER — OZEMPIC (0.25 OR 0.5 MG/DOSE) 2 MG/3ML ~~LOC~~ SOPN
PEN_INJECTOR | SUBCUTANEOUS | 0 refills | Status: DC
Start: 2023-05-08 — End: 2023-06-08

## 2023-05-08 NOTE — Telephone Encounter (Signed)
Pharmacy Patient Advocate Encounter  Received notification from [facility] that Prior Authorization for Dexcom G7 sensors has been APPROVED from 05/07/2023 to 11/05/2023   PA #/Case ID/Reference #: [id number]

## 2023-05-08 NOTE — Telephone Encounter (Signed)
Since Greggory Keen was not covered by her insurance then we can try Ozempic.  I will respond to her MyChart message.  Also, can we send the approval for her Dexcom to the pharmacy?

## 2023-05-08 NOTE — Telephone Encounter (Signed)
Pt called stating since the change in her insulin meds, her sugar has been running about 300 to 400. Pt asked does she need to take more? Pt hung up before transferring to access nurse. Please advise. Call back # [phone]

## 2023-05-08 NOTE — Telephone Encounter (Signed)
Clinical questions have been submitted for both PA's

## 2023-05-08 NOTE — Telephone Encounter (Signed)
I spoke with pt; pt said she was late getting up this morning and FBS at 8:30 am was 345. Pt ate 2 greek yogurts and  latest BS was at 11:30 AM BS was 415.  Pt said she has been taking Lantus 50 U in AM but pt did not take lantus this morning due to running behind and pt plans to take when gets home later today. Pt said [facility] PA was denied. Pt said since switching to Lantus 50 U pt BS readings have been higher than when taking the 70/30. Pt also said dexcom PA was approved but [facility] [place] needs copy of approval. Pt said she feels her normal self; no symptoms. UC & ED precautions given and pt request cb after note reviewed b Allayne Gitelman NP. Sending note to Allayne Gitelman NP and [facility] pool. Will teams Kelli.

## 2023-05-08 NOTE — Telephone Encounter (Signed)
Patient also sent MyChart message:

## 2023-05-08 NOTE — Telephone Encounter (Signed)
Pt called stating her insurance is now requiring a PA for Semaglutide,0.25 or 0.5MG /DOS, (OZEMPIC, 0.25 OR 0.5 MG/DOSE,) 2 MG/3ML SOPN? Call back # [phone]

## 2023-05-09 NOTE — Telephone Encounter (Signed)
She never got the [facility]. She is now trying to get the Ozempic.

## 2023-05-09 NOTE — Telephone Encounter (Signed)
Will pt be on both Mounjaro and Ozempic or is pt switching? Thanks

## 2023-05-12 ENCOUNTER — Other Ambulatory Visit: Payer: Self-pay

## 2023-05-13 ENCOUNTER — Telehealth: Payer: Self-pay

## 2023-05-13 NOTE — Telephone Encounter (Signed)
Pharmacy Patient Advocate Encounter   Received notification from Pt Calls Messages that prior authorization for Ozempic (0.25 or 0.5 MG/DOSE) 2MG /3ML pen-injectors is required/requested.   Insurance verification completed.   The patient is insured through [facility] .   Per test claim: PA required; PA submitted to [facility] via CoverMyMeds Key/confirmation #/EOC [id number] Status is pending

## 2023-05-13 NOTE — Telephone Encounter (Signed)
PA request has been Submitted. New Encounter created for follow up. For additional info see Pharmacy Prior Auth telephone encounter from 05/13/23.

## 2023-05-14 ENCOUNTER — Other Ambulatory Visit (HOSPITAL_COMMUNITY): Payer: Self-pay

## 2023-05-14 NOTE — Telephone Encounter (Signed)
Pharmacy Patient Advocate Encounter  Received notification from [facility] that Prior Authorization for Ozempic (0.25 or 0.5 MG/DOSE) 2MG /3ML pen-injectors has been APPROVED from 05/13/23 to 05/12/24   PA #/Case ID/Reference #: [id number]

## 2023-05-16 NOTE — Telephone Encounter (Signed)
Can we send this to the referrals pool to find out the status of both psychiatry and psychology referrals?

## 2023-05-26 ENCOUNTER — Encounter (HOSPITAL_COMMUNITY): Payer: Self-pay | Admitting: Family

## 2023-05-26 ENCOUNTER — Ambulatory Visit (HOSPITAL_COMMUNITY): Payer: MEDICAID | Admitting: Family

## 2023-05-26 VITALS — BP 144/91 | HR 92 | Ht 66.0 in | Wt 298.0 lb

## 2023-05-26 DIAGNOSIS — F411 Generalized anxiety disorder: Secondary | ICD-10-CM

## 2023-05-26 DIAGNOSIS — F3181 Bipolar II disorder: Secondary | ICD-10-CM

## 2023-05-26 DIAGNOSIS — F331 Major depressive disorder, recurrent, moderate: Secondary | ICD-10-CM

## 2023-05-26 MED ORDER — SERTRALINE HCL 100 MG PO TABS: 100.0000 mg | ORAL_TABLET | Freq: Every day | ORAL | 2 refills | Status: DC

## 2023-05-26 MED ORDER — HYDROXYZINE HCL 25 MG PO TABS: 25.0000 mg | ORAL_TABLET | Freq: Two times a day (BID) | ORAL | 0 refills | Status: DC
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
## 2023-05-27 ENCOUNTER — Encounter (HOSPITAL_COMMUNITY): Payer: Self-pay

## 2023-05-28 MED ORDER — HYDROXYZINE HCL 25 MG PO TABS: 25.0000 mg | ORAL_TABLET | Freq: Two times a day (BID) | ORAL | 0 refills | Status: DC

## 2023-05-28 MED ORDER — SERTRALINE HCL 100 MG PO TABS
100.0000 mg | ORAL_TABLET | Freq: Every day | ORAL | 2 refills | Status: DC
Start: 2023-05-28 — End: 2023-08-18

## 2023-05-28 NOTE — Addendum Note (Signed)
Addended by: Oneta Rack on: 05/28/2023 03:43 PM   Modules accepted: Orders

## 2023-05-29 ENCOUNTER — Ambulatory Visit (INDEPENDENT_AMBULATORY_CARE_PROVIDER_SITE_OTHER): Payer: MEDICAID | Admitting: Internal Medicine

## 2023-05-29 ENCOUNTER — Encounter: Payer: Self-pay | Admitting: Internal Medicine

## 2023-05-29 VITALS — BP 130/84 | HR 89 | Temp 98.3°F | Ht 66.0 in | Wt 292.0 lb

## 2023-05-29 DIAGNOSIS — J329 Chronic sinusitis, unspecified: Secondary | ICD-10-CM

## 2023-05-29 DIAGNOSIS — R42 Dizziness and giddiness: Secondary | ICD-10-CM | POA: Diagnosis not present

## 2023-05-29 MED ORDER — DOXYCYCLINE HYCLATE 100 MG PO TABS: 100.0000 mg | ORAL_TABLET | Freq: Two times a day (BID) | ORAL | 0 refills | Status: DC

## 2023-05-29 NOTE — Assessment & Plan Note (Signed)
Seems to be classic BPPV--and she has had it before Brief so will hold off on meclizine

## 2023-05-29 NOTE — Assessment & Plan Note (Signed)
Has mostly ear pain--but also respiratory symptoms and fatigue Ears are not infected--but probably have fluid Will treat with doxy 100 bid x 7 days Discussed adding an antihistamine
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
## 2023-06-05 DIAGNOSIS — E1165 Type 2 diabetes mellitus with hyperglycemia: Secondary | ICD-10-CM

## 2023-06-08 MED ORDER — SEMAGLUTIDE (1 MG/DOSE) 4 MG/3ML ~~LOC~~ SOPN
1.0000 mg | PEN_INJECTOR | SUBCUTANEOUS | 0 refills | Status: DC
Start: 2023-06-08 — End: 2023-06-30

## 2023-06-09 ENCOUNTER — Ambulatory Visit (INDEPENDENT_AMBULATORY_CARE_PROVIDER_SITE_OTHER): Payer: MEDICAID | Admitting: Licensed Clinical Social Worker

## 2023-06-09 ENCOUNTER — Encounter (HOSPITAL_COMMUNITY): Payer: Self-pay

## 2023-06-09 ENCOUNTER — Ambulatory Visit (HOSPITAL_COMMUNITY): Payer: MEDICAID | Admitting: Licensed Clinical Social Worker

## 2023-06-09 DIAGNOSIS — F3181 Bipolar II disorder: Secondary | ICD-10-CM | POA: Diagnosis not present

## 2023-06-09 DIAGNOSIS — F411 Generalized anxiety disorder: Secondary | ICD-10-CM | POA: Diagnosis not present

## 2023-06-09 DIAGNOSIS — F331 Major depressive disorder, recurrent, moderate: Secondary | ICD-10-CM | POA: Diagnosis not present
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
## 2023-06-18 ENCOUNTER — Ambulatory Visit (INDEPENDENT_AMBULATORY_CARE_PROVIDER_SITE_OTHER): Payer: MEDICAID | Admitting: Licensed Clinical Social Worker

## 2023-06-18 DIAGNOSIS — F411 Generalized anxiety disorder: Secondary | ICD-10-CM

## 2023-06-18 DIAGNOSIS — F3181 Bipolar II disorder: Secondary | ICD-10-CM | POA: Diagnosis not present

## 2023-06-18 DIAGNOSIS — F331 Major depressive disorder, recurrent, moderate: Secondary | ICD-10-CM | POA: Diagnosis not present

## 2023-06-18 NOTE — Progress Notes (Signed)
THERAPIST PROGRESS NOTE   Session Date: 06/18/2023  Session Time: 0808 - 0855  Participation Level: Active  Behavioral Response: Casual, Neat, and Well GroomedAlertAnxious and Euthymic  Type of Therapy: Individual Therapy  Treatment Goals addressed:  - LTG: Reduce frequency, intensity, and duration of depression symptoms so that daily functioning is improved (OP Depression) - LTG: Increase coping skills to manage depression and improve ability to perform daily activities (OP Depression) - STG: Joan [name] will identify cognitive patterns and beliefs that support depression (OP Depression) - STG: Joan [name] will reduce frequency of avoidant behaviors by 50% as evidenced by self-report in therapy sessions (Anxiety) - LTG: "Work on changing thought process by challenging automatic negative thoughts and  anxious thoughts in order to improve outlook and perspective" (OP Depression) - LTG: Joan [name] will score less than 5 on the Generalized Anxiety Disorder 7 Scale (GAD-7) (Anxiety) - STG: Report a decrease in anxiety symptoms as evidenced by an overall reduction in anxiety score by a minimum of 25% on the Generalized Anxiety Disorder Scale (GAD-7) (Anxiety)   ProgressTowards Goals: Progressing  Interventions: CBT, Motivational Interviewing, and Supportive  Summary: Joan [name] is a 42 y.o. female with past psych history of MDD, GAD, Bipolar 2, presenting for follow-up therapy session in efforts to improve management of depressive and anxious symptoms. Patient actively engaged in session, providing recounts of past weekly events. Pt reported having joined her local gym and started attending earlier this week, expressing positive feelings about doing so, and intentions to commit to gradually increasing frequency and duration at gym. Explored recent presentation of depressive and anxious sxs, identifying increased irritability over the past week, along with sleep difficulties. Actively engaged in exploring 115 Coping  Skills worksheet in efforts to explore possible healthy means of coping with increased stress and frustrations. Patient responded well to interventions. Patient continues to meet criteria for MDD, GAD, Bipolar 2. Patient will continue to benefit from engagement in outpatient therapy due to being the least restrictive service to meet presenting needs.    Suicidal/Homicidal: No  Therapist Response: Clinician utilized CBT, MI, and supportive reflection techniques to address presentation of depressive and anxious sxs. Supported pt in reflecting thoughts and feelings surrounding current work environment and desires to pursue alternate employment in the future. Encouraged pt to further explore events/activities she enjoys engaging in to increase daily activity. Assigned to continue reviewing 115 Coping Skills worksheet in efforts to identify alternate means of managing stress, and depressive and anxious sxs. Therapist provided support and empathy to patient during session.  Plan: Return again in 1 weeks.  Diagnosis:  Encounter Diagnoses  Name Primary?   Major depressive disorder, recurrent episode, moderate (HCC) Yes   GAD (generalized anxiety disorder)    Bipolar 2 disorder, major depressive episode (HCC)     Collaboration of Care: Other None necessary at this time.  Patient/Guardian was advised Release of Information must be obtained prior to any record release in order to collaborate their care with an outside provider. Patient/Guardian was advised if they have not already done so to contact the registration department to sign all necessary forms in order for [place] to release information regarding their care.   Consent: Patient/Guardian gives verbal consent for treatment and assignment of benefits for services provided during this visit. Patient/Guardian expressed understanding and agreed to proceed.   Leisa Lenz, MSW, LCSW 06/18/2023,  8:56 AM

## 2023-06-23 ENCOUNTER — Ambulatory Visit (INDEPENDENT_AMBULATORY_CARE_PROVIDER_SITE_OTHER): Payer: MEDICAID | Admitting: Licensed Clinical Social Worker

## 2023-06-23 DIAGNOSIS — F331 Major depressive disorder, recurrent, moderate: Secondary | ICD-10-CM

## 2023-06-23 DIAGNOSIS — F411 Generalized anxiety disorder: Secondary | ICD-10-CM | POA: Diagnosis not present

## 2023-06-23 DIAGNOSIS — F3181 Bipolar II disorder: Secondary | ICD-10-CM | POA: Diagnosis not present

## 2023-06-23 NOTE — Progress Notes (Signed)
THERAPIST PROGRESS NOTE   Session Date: 06/23/2023  Session Time: 1610-9604  Participation Level: Active  Behavioral Response: Casual, Neat, and Well GroomedAlertAnxious and Euthymic  Type of Therapy: Individual Therapy  Treatment Goals addressed:  - LTG: Reduce frequency, intensity, and duration of depression symptoms so that daily functioning is improved (OP Depression) - LTG: Increase coping skills to manage depression and improve ability to perform daily activities (OP Depression) - STG: Sharnell will identify cognitive patterns and beliefs that support depression (OP Depression) - STG: Jadence will reduce frequency of avoidant behaviors by 50% as evidenced by self-report in therapy sessions (Anxiety) - LTG: "Work on changing thought process by challenging automatic negative thoughts and  anxious thoughts in order to improve outlook and perspective" (OP Depression) - LTG: Malena will score less than 5 on the Generalized Anxiety Disorder 7 Scale (GAD-7) (Anxiety) - STG: Report a decrease in anxiety symptoms as evidenced by an overall reduction in anxiety score by a minimum of 25% on the Generalized Anxiety Disorder Scale (GAD-7) (Anxiety)   ProgressTowards Goals: Progressing  Interventions: CBT, Motivational Interviewing, and Supportive  Summary: Joan [name] is a 42 y.o. female with past psych history of MDD, GAD, Bipolar 2, presenting for follow-up therapy session in efforts to improve management of depressive and anxious symptoms. Patient actively engaged in session, participating in re-administering of PHQ9 and GAD7, reviewing initial scalings and the variances in relation to the reduction in depressive and anxious sxs and the management of related stressors. Explored pt's thoughts and feelings surrounding current and hx of engagement in therapy, identifying pt's motivating factors, prior efforts at therapy, and current outlook towards tx. Pt briefly detailed hx of trauma, ranging back to prior  marriage, and childhood. Patient responded well to interventions. Patient continues to meet criteria for MDD, GAD, Bipolar 2. Patient will continue to benefit from engagement in outpatient therapy due to being the least restrictive service to meet presenting needs.       06/23/2023    4:13 PM 06/09/2023    3:58 PM 05/26/2023    4:32 PM 03/13/2023   12:19 PM 12/11/2022    7:21 AM  Depression screen PHQ 2/9  Decreased Interest 0 2 1 0 0  Down, Depressed, Hopeless 2 3 3 1  0  PHQ - 2 Score 2 5 4 1  0  Altered sleeping 3 3 3 3  0  Tired, decreased energy 3 2 3 1  0  Change in appetite  2 3 2  0  Feeling bad or failure about yourself  0 2 2 1  0  Trouble concentrating 1 2 2 1  0  Moving slowly or fidgety/restless 0 1 0 0 0  Suicidal thoughts 0 2 2 1  0  PHQ-9 Score 9 19 19 10  0  Difficult doing work/chores Somewhat difficult Somewhat difficult Somewhat difficult  Not difficult at all      06/23/2023    4:08 PM 06/09/2023    3:56 PM 03/13/2023   12:20 PM 11/29/2022    7:22 AM  GAD 7 : Generalized Anxiety Score  Nervous, Anxious, on Edge 3 3 2 1   Control/stop worrying 1 3 2 1   Worry too much - different things 3 3 2 1   Trouble relaxing 3 3 2 1   Restless 0 3 2 1   Easily annoyed or irritable 2 3 2 1   Afraid - awful might happen 2 3 2 1   Total GAD 7 Score 14 21 14 7   Anxiety Difficulty Not difficult at all Very difficult  Not  difficult at all    Suicidal/Homicidal: No  Therapist Response: Clinician utilized CBT, MI, and supportive reflection techniques to support pt in navigating depressive and anxious sxs.Re-administered PHQ9 and GAD7, engaging pt in reflection of past two weeks and presence of depressive and anxious sxs and stressors, further processing variances in scorings over the past month, further exploring pt abilities at managing sxs and stressors. Evoked pt's past and current perspectives surrounding therapy and pt hx of avoiding certain topics within therapy due to difficulties in  processing hx of trauma. Therapist provided support and empathy to patient during session.  Plan: Return again in 1 weeks.  Diagnosis:  Encounter Diagnoses  Name Primary?   Major depressive disorder, recurrent episode, moderate (HCC) Yes   GAD (generalized anxiety disorder)    Bipolar 2 disorder, major depressive episode (HCC)      Collaboration of Care: Other None necessary at this time.  Patient/Guardian was advised Release of Information must be obtained prior to any record release in order to collaborate their care with an outside provider. Patient/Guardian was advised if they have not already done so to contact the registration department to sign all necessary forms in order for [place] to release information regarding their care.   Consent: Patient/Guardian gives verbal consent for treatment and assignment of benefits for services provided during this visit. Patient/Guardian expressed understanding and agreed to proceed.   Leisa Lenz, MSW, LCSW 06/23/2023,  9:54 PM

## 2023-06-24 ENCOUNTER — Telehealth (HOSPITAL_BASED_OUTPATIENT_CLINIC_OR_DEPARTMENT_OTHER): Payer: MEDICAID | Admitting: Family

## 2023-06-24 DIAGNOSIS — F3181 Bipolar II disorder: Secondary | ICD-10-CM

## 2023-06-24 DIAGNOSIS — F331 Major depressive disorder, recurrent, moderate: Secondary | ICD-10-CM

## 2023-06-24 MED ORDER — MIRTAZAPINE 7.5 MG PO TABS: 7.5000 mg | ORAL_TABLET | Freq: Every day | ORAL | 0 refills | Status: DC

## 2023-06-24 MED ORDER — GABAPENTIN 100 MG PO CAPS: 100.0000 mg | ORAL_CAPSULE | Freq: Two times a day (BID) | ORAL | 0 refills | Status: DC | PRN

## 2023-06-24 NOTE — Progress Notes (Unsigned)
Virtual Visit via Video Note  I connected with Joan [name] on 06/25/23 at  4:30 PM EST by a video enabled telemedicine application and verified that I am speaking with the correct person using two identifiers.  Location: Patient: Home  Provider: Office   I discussed the limitations of evaluation and management by telemedicine and the availability of in person appointments. The patient expressed understanding and agreed to proceed.     I discussed the assessment and treatment plan with the patient. The patient was provided an opportunity to ask questions and all were answered. The patient agreed with the plan and demonstrated an understanding of the instructions.   The patient was advised to call back or seek an in-person evaluation if the symptoms worsen or if the condition fails to improve as anticipated.  I provided 00 minutes of non-face-to-face time during this encounter.   Joan Rack, NP  [facility] MD/PA/NP OP Progress Note  06/24/2023 2:31 PM Joan [name]  MRN:  [id number]  Chief Complaint:  [name] stated " I am feeling better overall, but my anxiety is still high."  Joan [name] 42 year old Caucasian female was seen and evaluated via video teleassessment.  She presents for medication management appointment.  Patient is prescribed Zoloft and hydroxyzine to assist with depression and sleep disturbance.  She reports Hydroxyzine is ineffective as she continues to wake up throughout the night, stated that this medication doesn't help with her anxiety. States she is unable to take trazodone due to reported side effects.  Discussed initiating Remeron 7.5 mg  for sleep disturbances and mood stabilization. Discussed initiated gabapentin 100 mg p.o. twice daily for reported increased anxiety.  She reports she has a follow-up appointment with therapy services in office. Reported ongoing symptom of worries related to her husbands recent diagnosis of colon cancer.  No concerns related to  suicidal or homicidal ideations during this visit.  Sleep disturbance: Major depressive disorder:  Initiated  Remeron 7.5 mg p.o. daily Initiated Gabapentin 100 mg po BID for mood stabilizations Continue Zoloft 100 mg daily  Joan [name] was evaluated via tele-assessment she is alert/oriented x 3; calm/cooperative; and mood congruent with affect.  Patient is speaking in a clear tone at moderate volume, and normal pace; with good eye contact. Her thought process is coherent and relevant; There is no indication that she is currently responding to internal/external stimuli or experiencing delusional thought content.  Patient denies suicidal/self-harm/homicidal ideation, psychosis, and paranoia.  Patient has remained calm throughout assessment and has answered questions appropriately.   HPI:  Visit Diagnosis:    ICD-10-CM   1. Major depressive disorder, recurrent episode, moderate (HCC)  F33.1     2. Bipolar 2 disorder, major depressive episode (HCC)  F31.81       Past Psychiatric History:   Past Medical History:  Past Medical History:  Diagnosis Date   Acute asthma exacerbation 04/10/2018   Acute non-recurrent maxillary sinusitis 09/25/2018   Asthma    Asthma exacerbation 08/03/2020   Auditory hallucinations    only after anesthesia   BV (bacterial vaginosis) 02/13/2018   COVID-19 virus infection 08/16/2019   Diabetes mellitus without complication (HCC)    diet controlled   Diabetes mellitus, type II (HCC)    insulin, jardiance   Dyspnea    with exertion   Essential hypertension    Essential hypertension 11/24/2017   GERD (gastroesophageal reflux disease)    occasionally-NO MEDS   History of placement of ear tubes 05/20/2018  Hyperlipidemia    Kidney stone 02/11/2019   Localized skin mass, lump, or swelling 01/26/2021   MDD (major depressive disorder)    Miscarriage    Nausea & vomiting 10/05/2019   PTSD (post-traumatic stress disorder)    Right lower quadrant  abdominal pain 02/13/2018   Tachycardia     Past Surgical History:  Procedure Laterality Date   ABDOMINAL HYSTERECTOMY     ADENOIDECTOMY     CHOLECYSTECTOMY  2009   CYSTOSCOPY N/A 07/31/2018   Procedure: CYSTOSCOPY;  Surgeon: Christeen Douglas, MD;  Location: [facility];  Service: Gynecology;  Laterality: N/A;   LAPAROSCOPIC BILATERAL SALPINGECTOMY Bilateral 07/31/2018   Procedure: LAPAROSCOPIC BILATERAL SALPINGECTOMY;  Surgeon: Christeen Douglas, MD;  Location: [facility];  Service: Gynecology;  Laterality: Bilateral;   LAPAROSCOPIC HYSTERECTOMY N/A 07/31/2018   Procedure: HYSTERECTOMY TOTAL LAPAROSCOPIC;  Surgeon: Christeen Douglas, MD;  Location: [facility];  Service: Gynecology;  Laterality: N/A;   LAPAROSCOPY N/A 06/07/2019   Procedure: LAPAROSCOPY OPERATIVE, WITH PERITONEAL BIOPSIES;  Surgeon: Christeen Douglas, MD;  Location: [facility];  Service: Gynecology;  Laterality: N/A;   LYSIS OF ADHESION N/A 07/31/2018   Procedure: LYSIS OF ADHESION;  Surgeon: Christeen Douglas, MD;  Location: [facility];  Service: Gynecology;  Laterality: N/A;   OVARY SURGERY Right    cyst removed a while ago   TONSILLECTOMY     tubes in ear      Family Psychiatric History:   Family History:  Family History  Problem Relation Age of Onset   Asthma Mother    Diabetes Mother    Hyperlipidemia Mother    Hypertension Mother    Diabetes Father    Hyperlipidemia Father    Hypertension Father    Diabetes Brother    Depression Brother    Alcohol abuse Brother    Depression Maternal Grandmother    Stroke Paternal Grandmother    Breast cancer Neg Hx     Social History:  Social History   Socioeconomic History   Marital status: Married    Spouse name: chip   Number of children: 0   Years of education: Not on file   Highest education level: 12th grade  Occupational History   Occupation: day Occupational hygienist  Tobacco Use   Smoking status: Never   Smokeless tobacco: Never  Vaping Use   Vaping status: Never Used   Substance and Sexual Activity   Alcohol use: No   Drug use: No   Sexual activity: Yes    Birth control/protection: Surgical  Other Topics Concern   Not on file  Social History Narrative   Not on file   Social Determinants of Health   Financial Resource Strain: Low Risk  (04/27/2018)   Overall Financial Resource Strain (CARDIA)    Difficulty of Paying Living Expenses: Not hard at all  Food Insecurity: No Food Insecurity (12/16/2022)   Hunger Vital Sign    Worried About Running Out of Food in the Last Year: Never true    Ran Out of Food in the Last Year: Never true  Transportation Needs: No Transportation Needs (12/16/2022)   PRAPARE - Administrator, Civil Service (Medical): No    Lack of Transportation (Non-Medical): No  Physical Activity: Insufficiently Active (11/29/2022)   Exercise Vital Sign    Days of Exercise per Week: 4 days    Minutes of Exercise per Session: 30 min  Stress: Stress Concern Present (11/29/2022)   [employer] of Occupational Health - Occupational Stress Questionnaire  Feeling of Stress : Rather much  Social Connections: Socially Integrated (11/29/2022)   Social Connection and Isolation Panel [NHANES]    Frequency of Communication with Friends and Family: Twice a week    Frequency of Social Gatherings with Friends and Family: Twice a week    Attends Religious Services: More than 4 times per year    Active Member of [employer] or Organizations: Yes    Attends Engineer, structural: More than 4 times per year    Marital Status: Married    Allergies:  Allergies  Allergen Reactions   Ibuprofen Swelling and Other (See Comments)    Facial    Ciprofloxacin Hives and Rash   Metformin And Related Other (See Comments)    Elevated Lactic Acid   Penicillins Hives, Rash and Other (See Comments)    Has patient had a PCN reaction causing immediate rash, facial/tongue/throat swelling, SOB or lightheadedness with hypotension: No Has patient had  a PCN reaction causing severe rash involving mucus membranes or skin necrosis: No Has patient had a PCN reaction that required hospitalization: No Has patient had a PCN reaction occurring within the last 10 years: No If all of the above answers are "NO", then may proceed with Cephalosporin use. THE PATIENT IS ABLE TO TOLERATE CEPHALOSPORINS WITHOUT DIFFIC   Sulfa Antibiotics Hives and Rash   Other Other (See Comments)    ALL NUTS-SCRATCHY THROAT   Shellfish Allergy Other (See Comments)    ALL SEAFOOD-SCRATCHY THROAT   Trazodone And Nefazodone Other (See Comments)    "Causes me to hear voices"    Metabolic Disorder Labs: Lab Results  Component Value Date   HGBA1C 14.9 (A) 05/01/2023   MPG 315 10/22/2022   MPG 298 01/12/2021   Lab Results  Component Value Date   PROLACTIN 11.1 10/22/2022   PROLACTIN 14.5 05/05/2015   Lab Results  Component Value Date   CHOL 247 (H) 10/22/2022   TRIG 297 (H) 10/22/2022   HDL 65 10/22/2022   CHOLHDL 3.8 10/22/2022   VLDL 59 (H) 10/22/2022   LDLCALC 123 (H) 10/22/2022   LDLCALC  09/03/2019     Comment:     . LDL cholesterol not calculated. Triglyceride levels greater than 400 mg/dL invalidate calculated LDL results. . Reference range: <100 . Desirable range <100 mg/dL for primary prevention;   <70 mg/dL for patients with CHD or diabetic patients  with > or = 2 CHD risk factors. Marland Kitchen LDL-C is now calculated using the Martin-Hopkins  calculation, which is a validated novel method providing  better accuracy than the Friedewald equation in the  estimation of LDL-C.  Horald Pollen et al. Lenox Ahr. 1610;960(45): 2061-2068  (http://education.[employer].com/faq/FAQ164)    Lab Results  Component Value Date   TSH 1.925 10/22/2022   TSH 1.211 08/03/2020    Therapeutic Level Labs: No results found for: "LITHIUM" No results found for: "VALPROATE" No results found for: "CBMZ"  Current Medications: Current Outpatient Medications  Medication  Sig Dispense Refill   gabapentin (NEURONTIN) 100 MG capsule Take 1 capsule (100 mg total) by mouth 2 (two) times daily as needed. 60 capsule 0   mirtazapine (REMERON) 7.5 MG tablet Take 1 tablet (7.5 mg total) by mouth at bedtime. 30 tablet 0   albuterol (VENTOLIN HFA) 108 (90 Base) MCG/ACT inhaler Inhale 1 puff into the lungs every 4 (four) hours as needed for wheezing or shortness of breath.     atorvastatin (LIPITOR) 20 MG tablet Take 1 tablet (20 mg total) by  mouth daily. for cholesterol. 90 tablet 1   Continuous Glucose Receiver (DEXCOM G7 RECEIVER) DEVI Use to check blood sugars. 1 each 0   Continuous Glucose Sensor (DEXCOM G7 SENSOR) MISC Change every 10 days to check blood sugars continuously 3 each 3   doxycycline (VIBRA-TABS) 100 MG tablet Take 1 tablet (100 mg total) by mouth 2 (two) times daily. 14 tablet 0   erythromycin ophthalmic ointment Instill 0.5 in into the right eye four times daily for 5-7 days 3.5 g 0   hydrOXYzine (ATARAX) 25 MG tablet Take 1 tablet (25 mg total) by mouth 2 (two) times daily. Take 2 tablet  ( 50mg  total ) by mouth at night 60 tablet 0   hydrOXYzine (VISTARIL) 25 MG capsule Take 1 capsule (25 mg total) by mouth at bedtime. For sleep 90 capsule 1   insulin glargine (LANTUS) 100 UNIT/ML Solostar Pen Inject 50 Units into the skin daily. 45 mL 0   Insulin Pen Needle (PEN NEEDLES) 31G X 6 MM MISC Use nightly with insulin. 100 each 3   propranolol ER (INDERAL LA) 80 MG 24 hr capsule Take 1 capsule (80 mg total) by mouth at bedtime. For headache prevention 90 capsule 1   Semaglutide, 1 MG/DOSE, 4 MG/3ML SOPN Inject 1 mg as directed once a week. for diabetes. 3 mL 0   sertraline (ZOLOFT) 100 MG tablet Take 1 tablet (100 mg total) by mouth daily. 30 tablet 2   SUMAtriptan (IMITREX) 50 MG tablet Take 1 tablet by mouth at migraine onset. May repeat in 2 hours if headache persists or recurs. 10 tablet 0   valsartan (DIOVAN) 80 MG tablet Take 1 tablet (80 mg total) by  mouth daily. for blood pressure. 90 tablet 1   No current facility-administered medications for this visit.     Musculoskeletal: Tele-assessment   Psychiatric Specialty Exam: Review of Systems  Eyes: Negative.   Psychiatric/Behavioral:  Positive for sleep disturbance. Negative for decreased concentration. The patient is nervous/anxious.   All other systems reviewed and are negative.   Last menstrual period 07/24/2018.There is no height or weight on file to calculate BMI.  General Appearance: Casual  Eye Contact:  Good  Speech:  Clear and Coherent  Volume:  Normal  Mood:  Anxious and Depressed  Affect:  Congruent  Thought Process:  Coherent  Orientation:  Full (Time, Place, and Person)  Thought Content: Logical   Suicidal Thoughts:  No  Homicidal Thoughts:  No  Memory:  Immediate;   Good Recent;   Good  Judgement:  Good  Insight:  Good  Psychomotor Activity:  Normal  Concentration:  Concentration: Fair  Recall:  Good  Fund of Knowledge: Good  Language: Good  Akathisia:  No  Handed:  Right  AIMS (if indicated): not done  Assets:  Communication Skills Desire for Improvement Social Support  ADL's:  Intact  Cognition: WNL  Sleep:  Poor   Screenings: AIMS    Flowsheet Row [facility]) [facility]  AIMS Total Score 0 0      AUDIT    Flowsheet Row [facility]) [facility] ([facility]  Alcohol Use Disorder Identification Test Final Score (AUDIT) 0 0 0      GAD-7  Advertising copywriter from 06/23/2023 in [facility] from 06/09/2023 in [facility] at [facility] Visit from 03/13/2023 in [facility] Visit from 11/29/2022 in [facility]  Total GAD-7 Score 14 21 14 7       PHQ2-9    Flowsheet Row Counselor from 06/23/2023 in [facility] from 06/09/2023 in [facility] Visit from [facility]20[facility] Visit from 03/13/2023 in [facility] Visit from 12/11/2022 in [facility] [place]  PHQ-2 Total Score 2 5 4 1  0  PHQ-9 Total Score 9 19 19 10  0      Flowsheet Row Counselor from 06/09/2023 in [facility] at [facility] Visit from 05/26/2023 in [facility] from 03/08/2023 in [facility] at [facility]  C-SSRS RISK CATEGORY Low Risk Error: Q3, 4, or 5 should not be populated when Q2 is No No Risk        Assessment and Plan: Joan [name] is a 42 year old Caucasian female presents for medication management appointment.  She continues to endorse symptoms related to increased anxiety and ongoing ruminations.  States she is helping her husband through his cancer treatments.  States she continues to experience sleep disturbance.  States that hydroxyzine is not helping with her sleep symptoms at this point.  Discussed discontinuing hydroxyzine and patient to start Remeron 7.5 mg p.o. nightly.    Sleep disturbance: Major depressive disorder:  Initiated  Remeron 7.5 mg p.o.nightly  Initiated Gabapentin 100 mg po BID for mood stabilizations Continue Zoloft 100 mg daily  Follow-up x 2 months Keep follow-up appointment with therapist.   Collaboration of Care: Collaboration of Care: Medication Management AEB Remeron 7.5  mg and Gabapentin 100 mg BID   Patient/Guardian was advised Release of Information must be obtained prior to any record release in order to collaborate their care with an outside provider. Patient/Guardian was advised if they have not already done so to contact the registration department to sign all necessary forms in order for [place] to release information regarding their care.   Consent: Patient/Guardian gives verbal consent for treatment and assignment of benefits for services provided during this visit. Patient/Guardian expressed understanding and agreed to proceed.    Joan Rack, NP 06/25/2023, 2:31 PM

## 2023-06-25 ENCOUNTER — Encounter (HOSPITAL_COMMUNITY): Payer: Self-pay | Admitting: Family

## 2023-06-30 MED ORDER — SEMAGLUTIDE (1 MG/DOSE) 4 MG/3ML ~~LOC~~ SOPN: 1.0000 mg | PEN_INJECTOR | SUBCUTANEOUS | 0 refills | Status: DC

## 2023-06-30 MED ORDER — INSULIN GLARGINE 100 UNIT/ML SOLOSTAR PEN: PEN_INJECTOR | SUBCUTANEOUS | 0 refills | Status: DC

## 2023-07-01 ENCOUNTER — Ambulatory Visit (INDEPENDENT_AMBULATORY_CARE_PROVIDER_SITE_OTHER): Payer: MEDICAID | Admitting: Licensed Clinical Social Worker

## 2023-07-01 DIAGNOSIS — F331 Major depressive disorder, recurrent, moderate: Secondary | ICD-10-CM | POA: Diagnosis not present

## 2023-07-01 DIAGNOSIS — F3181 Bipolar II disorder: Secondary | ICD-10-CM | POA: Diagnosis not present

## 2023-07-01 DIAGNOSIS — F411 Generalized anxiety disorder: Secondary | ICD-10-CM | POA: Diagnosis not present

## 2023-07-01 NOTE — Progress Notes (Signed)
THERAPIST PROGRESS NOTE   Session Date: 07/01/2023  Session Time: 6213-0865  Participation Level: Active  Behavioral Response: Casual, Neat, and Well GroomedAlertEuthymic  Type of Therapy: Individual Therapy  Treatment Goals addressed:  - LTG: Reduce frequency, intensity, and duration of depression symptoms so that daily functioning is improved (OP Depression) - LTG: Increase coping skills to manage depression and improve ability to perform daily activities (OP Depression) - STG: Takyra will identify cognitive patterns and beliefs that support depression (OP Depression) - STG: Nachole will reduce frequency of avoidant behaviors by 50% as evidenced by self-report in therapy sessions (Anxiety) - LTG: "Work on changing thought process by challenging automatic negative thoughts and  anxious thoughts in order to improve outlook and perspective" (OP Depression) - LTG: Satoya will score less than 5 on the Generalized Anxiety Disorder 7 Scale (GAD-7) (Anxiety) - STG: Report a decrease in anxiety symptoms as evidenced by an overall reduction in anxiety score by a minimum of 25% on the Generalized Anxiety Disorder Scale (GAD-7) (Anxiety)   ProgressTowards Goals: Progressing  Interventions: CBT, Motivational Interviewing, and Supportive  Summary: Joan [name] is a 42 y.o. female with past psych history of MDD, GAD, Bipolar 2, presenting for follow-up therapy session in efforts to improve management of depressive and anxious symptoms. Patient actively engaged in session, providing recounts of past week since previous session, detailing noticeable improvements in decreased irritability and fatigue since med adjustments. Pt further detailed plans for the coming holidays and the challenges she regularly experiences during holiday season with missing time with her family, not having family of her own outside of her husband, and the sadness surrounding never having children. Further processed hx behind traditions and  the feelings pt recalled from childhood when engaging in such traditions, exploring pt's desires to begin creating her own traditions with husband. Revisited noticed reductions in depressive and anxious sxs identified by pt over the past week, as well as pt reports of husband having expressed noticeable improvements in moods. Processed pt's efforts at tracking moods and identifying things she proves to be grateful for to aid in improving outlook and perspectives. Patient continues to meet criteria for MDD, GAD, Bipolar 2. Patient will continue to benefit from engagement in outpatient therapy due to being the least restrictive service to meet presenting needs.       06/23/2023    4:13 PM 06/09/2023    3:58 PM 05/26/2023    4:32 PM 03/13/2023   12:19 PM 12/11/2022    7:21 AM  Depression screen PHQ 2/9  Decreased Interest 0 2 1 0 0  Down, Depressed, Hopeless 2 3 3 1  0  PHQ - 2 Score 2 5 4 1  0  Altered sleeping 3 3 3 3  0  Tired, decreased energy 3 2 3 1  0  Change in appetite  2 3 2  0  Feeling bad or failure about yourself  0 2 2 1  0  Trouble concentrating 1 2 2 1  0  Moving slowly or fidgety/restless 0 1 0 0 0  Suicidal thoughts 0 2 2 1  0  PHQ-9 Score 9 19 19 10  0  Difficult doing work/chores Somewhat difficult Somewhat difficult Somewhat difficult  Not difficult at all      06/23/2023    4:08 PM 06/09/2023    3:56 PM 03/13/2023   12:20 PM 11/29/2022    7:22 AM  GAD 7 : Generalized Anxiety Score  Nervous, Anxious, on Edge 3 3 2 1   Control/stop worrying 1 3 2  1  Worry too much - different things 3 3 2 1   Trouble relaxing 3 3 2 1   Restless 0 3 2 1   Easily annoyed or irritable 2 3 2 1   Afraid - awful might happen 2 3 2 1   Total GAD 7 Score 14 21 14 7   Anxiety Difficulty Not difficult at all Very difficult  Not difficult at all    Suicidal/Homicidal: No  Therapist Response: Clinician utilized CBT, MI, and supportive reflection techniques to support pt in navigating depressive and anxious  sxs. Evoked pt's recounts of past week and improvements observed across settings in the management of depressive and anxious sxs. Engaged pt in reflecting on experiences throughout childhood and the traditions created with family and how pt could begin creating new traditions with husband. Explored pt's understanding and commitment to begin implementing mood journaling process in order to track trends in moods and stressors. Therapist provided support and empathy to patient during session.  Plan: Return again in 1 weeks.  Diagnosis:  Encounter Diagnoses  Name Primary?   Bipolar 2 disorder, major depressive episode (HCC) Yes   GAD (generalized anxiety disorder)    Major depressive disorder, recurrent episode, moderate (HCC)       Collaboration of Care: Other None necessary at this time.  Patient/Guardian was advised Release of Information must be obtained prior to any record release in order to collaborate their care with an outside provider. Patient/Guardian was advised if they have not already done so to contact the registration department to sign all necessary forms in order for [place] to release information regarding their care.   Consent: Patient/Guardian gives verbal consent for treatment and assignment of benefits for services provided during this visit. Patient/Guardian expressed understanding and agreed to proceed.   Leisa Lenz, MSW, LCSW 07/01/2023,  1:59 PM

## 2023-07-17 ENCOUNTER — Ambulatory Visit (HOSPITAL_COMMUNITY): Payer: MEDICAID | Admitting: Licensed Clinical Social Worker

## 2023-07-22 ENCOUNTER — Other Ambulatory Visit (HOSPITAL_COMMUNITY): Payer: Self-pay

## 2023-07-22 MED ORDER — GABAPENTIN 100 MG PO CAPS: 100.0000 mg | ORAL_CAPSULE | Freq: Two times a day (BID) | ORAL | 0 refills | Status: DC | PRN

## 2023-07-22 MED ORDER — MIRTAZAPINE 7.5 MG PO TABS: 7.5000 mg | ORAL_TABLET | Freq: Every day | ORAL | 0 refills | Status: DC

## 2023-07-29 ENCOUNTER — Other Ambulatory Visit: Payer: Self-pay | Admitting: Primary Care

## 2023-07-29 ENCOUNTER — Ambulatory Visit (INDEPENDENT_AMBULATORY_CARE_PROVIDER_SITE_OTHER): Payer: MEDICAID | Admitting: Licensed Clinical Social Worker

## 2023-07-29 DIAGNOSIS — F411 Generalized anxiety disorder: Secondary | ICD-10-CM | POA: Diagnosis not present

## 2023-07-29 DIAGNOSIS — F3181 Bipolar II disorder: Secondary | ICD-10-CM

## 2023-07-29 DIAGNOSIS — E1165 Type 2 diabetes mellitus with hyperglycemia: Secondary | ICD-10-CM

## 2023-07-29 DIAGNOSIS — F331 Major depressive disorder, recurrent, moderate: Secondary | ICD-10-CM

## 2023-07-29 NOTE — Progress Notes (Signed)
THERAPIST PROGRESS NOTE   Session Date: 07/29/2023  Session Time: 1610-9604  Virtual Visit via Video Note  I connected with Joan [name] on 07/29/23 at  8:00 AM EST by a video enabled telemedicine application and verified that I am speaking with the correct person using two identifiers.  Location: Patient: Home Provider: Home Office   I discussed the limitations of evaluation and management by telemedicine and the availability of in person appointments. The patient expressed understanding and agreed to proceed.   The patient was advised to call back or seek an in-person evaluation if the symptoms worsen or if the condition fails to improve as anticipated.  I provided 43 minutes of non-face-to-face time during this encounter.  Participation Level: Active  Behavioral Response: Casual, Neat, and Well GroomedAlertEuthymic  Type of Therapy: Individual Therapy  Treatment Goals addressed:  - LTG: Reduce frequency, intensity, and duration of depression symptoms so that daily functioning is improved (OP Depression) - LTG: Increase coping skills to manage depression and improve ability to perform daily activities (OP Depression) - STG: Joan [name] will identify cognitive patterns and beliefs that support depression (OP Depression) - STG: Joan [name] will reduce frequency of avoidant behaviors by 50% as evidenced by self-report in therapy sessions (Anxiety) - LTG: "Work on changing thought process by challenging automatic negative thoughts and  anxious thoughts in order to improve outlook and perspective" (OP Depression) - LTG: Joan [name] will score less than 5 on the Generalized Anxiety Disorder 7 Scale (GAD-7) (Anxiety) - STG: Report a decrease in anxiety symptoms as evidenced by an overall reduction in anxiety score by a minimum of 25% on the Generalized Anxiety Disorder Scale (GAD-7) (Anxiety)   ProgressTowards Goals: Progressing  Interventions: CBT, Motivational Interviewing, and  Supportive  Summary: Joan [name] is a 42 y.o. female with past psych history of MDD, GAD, Bipolar 2, presenting for follow-up therapy session in efforts to improve management of depressive and anxious symptoms. Patient actively engaged in session, presenting in pleasant moods throughout today's session.  Patient actively detailed having noticed continued progression in management of depressive and anxious symptoms and overall improved moods over the past 3 to 4 weeks.  Patient detailed efforts towards improved management of anxious symptoms to include increasing frequency of working out to approximately 4-5 times weekly, and improving sleep by adopting healthy sleep routine to include soft lighting and abstaining from BlueLite devices prior to bed, resulting in achieving approximately 8 to 9 hours of sleep a night.  Further processed how improved sleep is impacting reduction of depressive symptoms and overall improvement in moods, as well as implementing practice of identifying points of gratitude and/or optimism first thing in the morning and aims of setting the tone for her day.  Actively reflected on events of past and approaching holiday, revisiting previously discussed efforts at creating/developing family traditions with husband.  Patient detailed having recently adopted a new dog over the past month, which to his brain getting patient and husband added joy.  Readministered PHQ-9 and GAD-7, exploring continued downward trend in scaling's.  Explored CBT triangle and the impact on which thoughts feelings and behaviors have on depression and continued depressive episodes. Patient continues to meet criteria for MDD, GAD, Bipolar 2. Patient will continue to benefit from engagement in outpatient therapy due to being the least restrictive service to meet presenting needs.       07/29/2023    8:13 AM 06/23/2023    4:13 PM 06/09/2023    3:58 PM 05/26/2023    4:32  PM 03/13/2023   12:19 PM  Depression screen PHQ 2/9   Decreased Interest 0 0 2 1 0  Down, Depressed, Hopeless 1 2 3 3 1   PHQ - 2 Score 1 2 5 4 1   Altered sleeping 0 3 3 3 3   Tired, decreased energy 1 3 2 3 1   Change in appetite 0  2 3 2   Feeling bad or failure about yourself  1 0 2 2 1   Trouble concentrating 0 1 2 2 1   Moving slowly or fidgety/restless 0 0 1 0 0  Suicidal thoughts 0 0 2 2 1   PHQ-9 Score 3 9 19 19 10   Difficult doing work/chores Not difficult at all Somewhat difficult Somewhat difficult Somewhat difficult       07/29/2023    8:06 AM 06/23/2023    4:08 PM 06/09/2023    3:56 PM 03/13/2023   12:20 PM  GAD 7 : Generalized Anxiety Score  Nervous, Anxious, on Edge 1 3 3 2   Control/stop worrying 1 1 3 2   Worry too much - different things 3 3 3 2   Trouble relaxing 0 3 3 2   Restless 0 0 3 2  Easily annoyed or irritable 1 2 3 2   Afraid - awful might happen 1 2 3 2   Total GAD 7 Score 7 14 21 14   Anxiety Difficulty Not difficult at all Not difficult at all Very difficult     Suicidal/Homicidal: No  Therapist Response: Clinician utilized CBT, MI, and supportive reflection techniques to support pt in navigating depressive and anxious sxs. Evoked pt's recounts of past week and improvements observed across settings in the management of depressive and anxious sxs.  Actively engage patient in supportive reflection of recent weeks and past history, processing events since previous session, and further evoking patient's thoughts and perspectives on identified areas of progress.  Further elicited patient's own reported observations of improvements in moods and management of symptoms.  Employed CBT techniques and supporting patient in processing how thoughts, feelings, and behaviors are all intertwined, and how these impact depression.  Reflected on patient's efforts at tracking moods and stressors, as well as identifying points of gratitude.  Encouraged patient to adopt additional technique in the management of depressive thoughts when  experiencing, by implementing efforts to identify 5 positive opposites. Therapist provided support and empathy to patient during session.   Plan: Return again in 2 weeks.  Diagnosis:  Encounter Diagnoses  Name Primary?   Bipolar 2 disorder, major depressive episode (HCC) Yes   GAD (generalized anxiety disorder)    Major depressive disorder, recurrent episode, moderate (HCC)        Collaboration of Care: Other None necessary at this time.  Patient/Guardian was advised Release of Information must be obtained prior to any record release in order to collaborate their care with an outside provider. Patient/Guardian was advised if they have not already done so to contact the registration department to sign all necessary forms in order for [place] to release information regarding their care.   Consent: Patient/Guardian gives verbal consent for treatment and assignment of benefits for services provided during this visit. Patient/Guardian expressed understanding and agreed to proceed.   Leisa Lenz, MSW, LCSW 07/29/2023,  8:21 AM

## 2023-07-29 NOTE — Telephone Encounter (Signed)
Last office visit 05/29/2023 with Dr. Alphonsus Sias for recurrent sinusitis and vertigo.  Last refilled 06/30/2023 for 3 ml with no refills.  Next Appt: 07/31/2023 for 3 month follow up.

## 2023-07-31 ENCOUNTER — Encounter: Payer: Self-pay | Admitting: Primary Care

## 2023-07-31 ENCOUNTER — Ambulatory Visit: Payer: MEDICAID | Admitting: Primary Care

## 2023-07-31 VITALS — BP 136/82 | HR 72 | Temp 96.9°F | Ht 66.0 in | Wt 289.0 lb

## 2023-07-31 DIAGNOSIS — E1165 Type 2 diabetes mellitus with hyperglycemia: Secondary | ICD-10-CM

## 2023-07-31 DIAGNOSIS — Z794 Long term (current) use of insulin: Secondary | ICD-10-CM | POA: Diagnosis not present

## 2023-07-31 DIAGNOSIS — Z7985 Long-term (current) use of injectable non-insulin antidiabetic drugs: Secondary | ICD-10-CM

## 2023-07-31 LAB — POCT GLYCOSYLATED HEMOGLOBIN (HGB A1C): Hemoglobin A1C: 7.5 % — AB (ref 4.0–5.6)

## 2023-07-31 MED ORDER — SEMAGLUTIDE (2 MG/DOSE) 8 MG/3ML ~~LOC~~ SOPN: 2.0000 mg | PEN_INJECTOR | SUBCUTANEOUS | 0 refills | Status: DC

## 2023-07-31 NOTE — Assessment & Plan Note (Addendum)
Significant improvement with A1C today of 7.5!  Commended her on dietary changes!  Increase Ozempic to 2 mg daily for weight loss purposes. Add Colace daily for constipation. Continue Lantus 50 units in AM and 5-12 units in PM. Goal would be to wean down off Lantus eventually. She will watch glucose readings and notify if she begins to titrate down.   Follow up in 3 months.

## 2023-07-31 NOTE — Addendum Note (Signed)
Addended by: Alvina Chou on: 07/31/2023 10:50 AM   Modules accepted: Orders

## 2023-07-31 NOTE — Patient Instructions (Addendum)
Stop by the lab prior to leaving today. I will notify you of your results once received.   We increased your dose of Ozempic to 2 mg weekly for diabetes and weight loss.   Please schedule a physical to meet with me in 3 months.   It was a pleasure to see you today!
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
## 2023-08-06 ENCOUNTER — Ambulatory Visit
Admission: EM | Admit: 2023-08-06 | Discharge: 2023-08-06 | Disposition: A | Discharge status: Home / Self Care | Payer: MEDICAID | Attending: Family Medicine | Admitting: Family Medicine

## 2023-08-06 DIAGNOSIS — B349 Viral infection, unspecified: Secondary | ICD-10-CM

## 2023-08-06 DIAGNOSIS — R051 Acute cough: Secondary | ICD-10-CM

## 2023-08-06 LAB — POCT INFLUENZA A/B
Influenza A, POC: NEGATIVE
Influenza B, POC: NEGATIVE

## 2023-08-06 MED ORDER — PROMETHAZINE-DM 6.25-15 MG/5ML PO SYRP: 5.0000 mL | ORAL_SOLUTION | Freq: Four times a day (QID) | ORAL | 0 refills | Status: DC | PRN

## 2023-08-06 MED ORDER — IPRATROPIUM BROMIDE 0.03 % NA SOLN: 2.0000 | Freq: Two times a day (BID) | NASAL | 0 refills | Status: DC

## 2023-08-06 NOTE — ED Provider Notes (Signed)
 [facility]    CSN: [id number] Arrival date & time: 08/06/23  1547      History   Chief Complaint No chief complaint on file.   HPI Joan [name] is a 43 y.o. female  presents for evaluation of URI symptoms for 3 days. Patient reports associated symptoms of cough, congestion, sore throat, laryngitis, body aches. Denies N/V/D, fevers, ear pain, shortness of breath. Patient does have a hx of asthma.  Reports some chest tightness without wheezing.  Has an albuterol  inhaler but states it has not been bad enough for her to use it.  Patient is not an active smoker.   Reports sick contacts via her job.  Pt has taken nothing OTC for symptoms. Pt has no other concerns at this time.   HPI  Past Medical History:  Diagnosis Date   Acute asthma exacerbation 04/10/2018   Acute non-recurrent maxillary sinusitis 09/25/2018   Asthma    Asthma exacerbation 08/03/2020   Auditory hallucinations    only after anesthesia   BV (bacterial vaginosis) 02/13/2018   COVID-19 virus infection 08/16/2019   Diabetes mellitus without complication (HCC)    diet controlled   Diabetes mellitus, type II (HCC)    insulin , jardiance    Dyspnea    with exertion   Essential hypertension    Essential hypertension 11/24/2017   GERD (gastroesophageal reflux disease)    occasionally-NO MEDS   History of placement of ear tubes 05/20/2018   Hyperlipidemia    Kidney stone 02/11/2019   Localized skin mass, lump, or swelling 01/26/2021   MDD (major depressive disorder)    Miscarriage    Nausea & vomiting 10/05/2019   PTSD (post-traumatic stress disorder)    Right lower quadrant abdominal pain 02/13/2018   Tachycardia     Patient Active Problem List   Diagnosis Date Noted   Major depressive disorder, recurrent episode, moderate (HCC) 06/09/2023   Vertigo 05/29/2023   Acute bacterial conjunctivitis of right eye 05/01/2023   Bipolar 2 disorder, major depressive episode (HCC) 10/23/2022   Migraines  01/26/2021   GAD (generalized anxiety disorder) 10/24/2020   Asthma 11/03/2018   Recurrent sinusitis 05/20/2018   Tachycardia 05/04/2018   Uncontrolled type 2 diabetes mellitus with hyperglycemia (HCC) 11/24/2017   Essential hypertension 11/24/2017   Hyperlipidemia 11/24/2017   Major depressive disorder, recurrent, severe without psychotic features (HCC)    PTSD (post-traumatic stress disorder) 04/11/2015    Past Surgical History:  Procedure Laterality Date   ABDOMINAL HYSTERECTOMY     ADENOIDECTOMY     CHOLECYSTECTOMY  2009   CYSTOSCOPY N/A 07/31/2018   Procedure: CYSTOSCOPY;  Surgeon: Verdon Keen, MD;  Location: [facility];  Service: Gynecology;  Laterality: N/A;   LAPAROSCOPIC BILATERAL SALPINGECTOMY Bilateral 07/31/2018   Procedure: LAPAROSCOPIC BILATERAL SALPINGECTOMY;  Surgeon: Verdon Keen, MD;  Location: [facility];  Service: Gynecology;  Laterality: Bilateral;   LAPAROSCOPIC HYSTERECTOMY N/A 07/31/2018   Procedure: HYSTERECTOMY TOTAL LAPAROSCOPIC;  Surgeon: Verdon Keen, MD;  Location: [facility];  Service: Gynecology;  Laterality: N/A;   LAPAROSCOPY N/A 06/07/2019   Procedure: LAPAROSCOPY OPERATIVE, WITH PERITONEAL BIOPSIES;  Surgeon: Verdon Keen, MD;  Location: [facility];  Service: Gynecology;  Laterality: N/A;   LYSIS OF ADHESION N/A 07/31/2018   Procedure: LYSIS OF ADHESION;  Surgeon: Verdon Keen, MD;  Location: [facility];  Service: Gynecology;  Laterality: N/A;   OVARY SURGERY Right    cyst removed a while ago   TONSILLECTOMY     tubes in ear  OB History   No obstetric history on file.      Home Medications    Prior to Admission medications   Medication Sig Start Date End Date Taking? Authorizing Provider  albuterol  (VENTOLIN  HFA) 108 (90 Base) MCG/ACT inhaler Inhale 1 puff into the lungs every 4 (four) hours as needed for wheezing or shortness of breath. 04/23/17  Yes [provider]  atorvastatin  (LIPITOR) 20 MG tablet Take 1  tablet (20 mg total) by mouth daily. for cholesterol. 05/01/23  Yes Gretta Comer POUR, NP  Continuous Glucose Receiver (DEXCOM G7 RECEIVER) DEVI Use to check blood sugars. 05/02/23  Yes Gretta Comer POUR, NP  Continuous Glucose Sensor (DEXCOM G7 SENSOR) MISC Change every 10 days to check blood sugars continuously 05/01/23  Yes Gretta Comer POUR, NP  gabapentin  (NEURONTIN ) 100 MG capsule Take 1 capsule (100 mg total) by mouth 2 (two) times daily as needed. 07/22/23 08/21/23 Yes Ezzard Staci SAILOR, NP  insulin  glargine (LANTUS ) 100 UNIT/ML Solostar Pen Inject 50 units every morning and 10 units every evening for diabetes. 06/30/23  Yes Clark, Katherine K, NP  Insulin  Pen Needle (PEN NEEDLES) 31G X 6 MM MISC Use nightly with insulin . 05/01/23  Yes Clark, Katherine K, NP  ipratropium (ATROVENT ) 0.03 % nasal spray Place 2 sprays into both nostrils every 12 (twelve) hours. 08/06/23  Yes Carvin Almas, Jodi R, NP  mirtazapine  (REMERON ) 7.5 MG tablet Take 1 tablet (7.5 mg total) by mouth at bedtime. 07/22/23  Yes Ezzard Staci SAILOR, NP  promethazine -dextromethorphan (PROMETHAZINE -DM) 6.25-15 MG/5ML syrup Take 5 mLs by mouth 4 (four) times daily as needed for cough. 08/06/23  Yes Tana Trefry, Jodi R, NP  propranolol  ER (INDERAL  LA) 80 MG 24 hr capsule Take 1 capsule (80 mg total) by mouth at bedtime. For headache prevention 05/01/23  Yes Clark, Katherine K, NP  Semaglutide , 2 MG/DOSE, 8 MG/3ML SOPN Inject 2 mg as directed once a week. for diabetes. 07/31/23  Yes Gretta Comer POUR, NP  sertraline  (ZOLOFT ) 100 MG tablet Take 1 tablet (100 mg total) by mouth daily. 05/28/23 05/27/24 Yes Ezzard Staci SAILOR, NP  SUMAtriptan  (IMITREX ) 50 MG tablet Take 1 tablet by mouth at migraine onset. May repeat in 2 hours if headache persists or recurs. 05/01/23  Yes Clark, Katherine K, NP  valsartan  (DIOVAN ) 80 MG tablet Take 1 tablet (80 mg total) by mouth daily. for blood pressure. 05/01/23  Yes Clark, Katherine K, NP  hydrOXYzine  (ATARAX ) 25 MG tablet  Take 1 tablet (25 mg total) by mouth 2 (two) times daily. Take 2 tablet  ( 50mg  total ) by mouth at night Patient not taking: Reported on 07/31/2023 05/28/23   Ezzard Staci SAILOR, NP    Family History Family History  Problem Relation Age of Onset   Asthma Mother    Diabetes Mother    Hyperlipidemia Mother    Hypertension Mother    Diabetes Father    Hyperlipidemia Father    Hypertension Father    Diabetes Brother    Depression Brother    Alcohol abuse Brother    Depression Maternal Grandmother    Stroke Paternal Grandmother    Breast cancer Neg Hx     Social History Social History   Tobacco Use   Smoking status: Never   Smokeless tobacco: Never  Vaping Use   Vaping status: Never Used  Substance Use Topics   Alcohol use: No   Drug use: No     Allergies   Ibuprofen, Ciprofloxacin, Metformin  and related,  Penicillins, Sulfa antibiotics, Other, Shellfish allergy , and Trazodone  and nefazodone   Review of Systems Review of Systems  HENT:  Positive for congestion, sore throat and voice change.   Respiratory:  Positive for cough and chest tightness.      Physical Exam Triage Vital Signs ED Triage Vitals [08/06/23 1711]  Encounter Vitals Group     BP 121/75     Systolic BP Percentile      Diastolic BP Percentile      Pulse Rate 100     Resp 18     Temp 97.9 F (36.6 C)     Temp Source Oral     SpO2 96 %     Weight      Height      Head Circumference      Peak Flow      Pain Score      Pain Loc      Pain Education      Exclude from Growth Chart    No data found.  Updated Vital Signs BP 121/75 (BP Location: Left Arm)   Pulse 100   Temp 97.9 F (36.6 C) (Oral)   Resp 18   LMP 07/24/2018 (Exact Date) Comment: surgery 07/31/2018  SpO2 96%   Visual Acuity Right Eye Distance:   Left Eye Distance:   Bilateral Distance:    Right Eye Near:   Left Eye Near:    Bilateral Near:     Physical Exam Vitals and nursing note reviewed.  Constitutional:       General: She is not in acute distress.    Appearance: She is well-developed. She is not ill-appearing.  HENT:     Head: Normocephalic and atraumatic.     Right Ear: Tympanic membrane and ear canal normal.     Left Ear: Tympanic membrane and ear canal normal.     Nose: Congestion present.     Mouth/Throat:     Mouth: Mucous membranes are moist.     Pharynx: Oropharynx is clear. Uvula midline. Posterior oropharyngeal erythema present.     Tonsils: No tonsillar exudate or tonsillar abscesses.  Eyes:     Conjunctiva/sclera: Conjunctivae normal.     Pupils: Pupils are equal, round, and reactive to light.  Cardiovascular:     Rate and Rhythm: Normal rate and regular rhythm.     Heart sounds: Normal heart sounds.  Pulmonary:     Effort: Pulmonary effort is normal.     Breath sounds: Normal breath sounds. No wheezing.  Musculoskeletal:     Cervical back: Normal range of motion and neck supple.  Lymphadenopathy:     Cervical: No cervical adenopathy.  Skin:    General: Skin is warm and dry.  Neurological:     General: No focal deficit present.     Mental Status: She is alert and oriented to person, place, and time.  Psychiatric:        Mood and Affect: Mood normal.        Behavior: Behavior normal.      UC Treatments / Results  Labs (all labs ordered are listed, but only abnormal results are displayed) Labs Reviewed  SARS CORONAVIRUS 2 (TAT 6-24 HRS)  POCT INFLUENZA A/B    EKG   Radiology No results found.  Procedures Procedures (including critical care time)  Medications Ordered in UC Medications - No data to display  Initial Impression / Assessment and Plan / UC Course  I have reviewed the triage vital signs and  the nursing notes.  Pertinent labs & imaging results that were available during my care of the patient were reviewed by me and considered in my medical decision making (see chart for details).    Reviewed exam and symptoms with patient.  No red flags.   Negative rapid flu testing.  COVID PCR pending will contact if positive. Patient declined nebulizer in clinic stating she does not feel like she needs it.  Discussed viral illness and symptomatic treatment.  Promethazine  DM as needed for cough, side effect profile reviewed.  Atrovent  nasal spray as needed for nasal congestion.  Patient to use her albuterol  inhaler as needed for wheezing or shortness of breath.  PCP follow-up as symptoms do not improve.  ER precautions reviewed. Final Clinical Impressions(s) / UC Diagnoses   Final diagnoses:  Acute cough  Viral illness     Discharge Instructions      The clinical contact you with results of the COVID test if positive.  You may take Promethazine  DM as needed for cough.  Please note this medication can make you drowsy.  Do not drink alcohol or drive while on this medication.  Atrovent  nasal spray as needed for nasal congestion.  Lots of rest and fluids.  You have your butyryl inhaler as needed for shortness of breath or wheezing.  Please follow-up with your PCP if your symptoms do not improve.  Please go to the ER for any worsening symptoms.  I hope you feel better soon!     ED Prescriptions     Medication Sig Dispense Auth. Provider   promethazine -dextromethorphan (PROMETHAZINE -DM) 6.25-15 MG/5ML syrup Take 5 mLs by mouth 4 (four) times daily as needed for cough. 118 mL Daymein Nunnery, Jodi R, NP   ipratropium (ATROVENT ) 0.03 % nasal spray Place 2 sprays into both nostrils every 12 (twelve) hours. 30 mL Sierra Spargo, Jodi R, NP      PDMP not reviewed this encounter.   Loreda Myla SAUNDERS, NP 08/06/23 [phone]

## 2023-08-06 NOTE — ED Triage Notes (Signed)
 Body aches, chills and cough x 3 days. Patient is not taking any medication to help symptoms.

## 2023-08-06 NOTE — Discharge Instructions (Signed)
 The clinical contact you with results of the COVID test if positive.  You may take Promethazine  DM as needed for cough.  Please note this medication can make you drowsy.  Do not drink alcohol or drive while on this medication.  Atrovent  nasal spray as needed for nasal congestion.  Lots of rest and fluids.  You have your butyryl inhaler as needed for shortness of breath or wheezing.  Please follow-up with your PCP if your symptoms do not improve.  Please go to the ER for any worsening symptoms.  I hope you feel better soon!

## 2023-08-07 LAB — SARS CORONAVIRUS 2 (TAT 6-24 HRS): SARS Coronavirus 2: NEGATIVE

## 2023-08-08 DIAGNOSIS — E1165 Type 2 diabetes mellitus with hyperglycemia: Secondary | ICD-10-CM

## 2023-08-12 NOTE — Telephone Encounter (Signed)
 I do not see where I have discussed this issue within the last year.  She lives in [place] so we can try a virtual visit.  Please offer her that if she would like.

## 2023-08-13 ENCOUNTER — Ambulatory Visit (HOSPITAL_COMMUNITY): Payer: MEDICAID | Admitting: Licensed Clinical Social Worker

## 2023-08-18 ENCOUNTER — Telehealth (HOSPITAL_COMMUNITY): Payer: MEDICAID | Admitting: Family

## 2023-08-18 DIAGNOSIS — F3181 Bipolar II disorder: Secondary | ICD-10-CM

## 2023-08-18 DIAGNOSIS — F331 Major depressive disorder, recurrent, moderate: Secondary | ICD-10-CM

## 2023-08-18 MED ORDER — GABAPENTIN 100 MG PO CAPS: 100.0000 mg | ORAL_CAPSULE | Freq: Two times a day (BID) | ORAL | 1 refills | Status: DC | PRN

## 2023-08-18 MED ORDER — MIRTAZAPINE 7.5 MG PO TABS: 7.5000 mg | ORAL_TABLET | Freq: Every day | ORAL | 1 refills | Status: DC

## 2023-08-18 MED ORDER — SERTRALINE HCL 50 MG PO TABS: 50.0000 mg | ORAL_TABLET | Freq: Every day | ORAL | 1 refills | Status: DC

## 2023-08-18 NOTE — Progress Notes (Signed)
 Virtual Visit via Video Note  I connected with Joan [name] on 08/18/23 at  9:00 AM EST by a video enabled telemedicine application and verified that I am speaking with the correct person using two identifiers.  Location: Patient: Home Provider: Office   I discussed the limitations of evaluation and management by telemedicine and the availability of in person appointments. The patient expressed understanding and agreed to proceed.   I discussed the assessment and treatment plan with the patient. The patient was provided an opportunity to ask questions and all were answered. The patient agreed with the plan and demonstrated an understanding of the instructions.   The patient was advised to call back or seek an in-person evaluation if the symptoms worsen or if the condition fails to improve as anticipated.  I provided 15 minutes of non-face-to-face time during this encounter.   Staci LOISE Kerns, NP   BH MD/PA/NP OP Progress Note  08/18/2023 9:04 AM ARIEL DIMITRI  MRN:  [id number]  Chief Complaint:  Joan reported   I am doing and feeling fine, do you think I can go back down to Zoloft  50 mg?   [name] 43 year old female presents for medication management.  She carries a diagnosis related to major depressive disorder recurrent, generalized anxiety disorder, bipolar 2 disorder and posttraumatic stress disorder.  Currently she is prescribed Zoloft , mirtazapine  and gabapentin  for mood stabilization.  She reports taking and tolerating well.  She reports a good appetite.  States she is resting well throughout the night.  States she has been in a good place for the past 3 to 4 months.  States she has been attending therapy sessions.  Major depressive disorder Generalized anxiety disorder Sleep disturbance  Continue Remeron  7.5 mg nightly Continue gabapentin  100 mg p.o. twice daily Tapered Zoloft  100 mg to 50 mg daily  [name] awake alert oriented x 3.  She was calm and cooperative  mood congruent with affect throughout this assessment.  Speaking in clear moderate normal pace with good eye contact.  Thought processes clear coherent and relevant no indication that patient is responding to internal or external stimuli.  Denied any concerns related to suicidal or homicidal ideations.  She remained calm throughout this assessment and answered all questions appropriately.  Observed sitting in her car with a blue hooded sweatshirt.Casually dressed mood congruent.   HPI:  Visit Diagnosis:    ICD-10-CM   1. Major depressive disorder, recurrent episode, moderate (HCC)  F33.1     2. Bipolar 2 disorder (HCC)  F31.81       Past Psychiatric History:   Past Medical History:  Past Medical History:  Diagnosis Date   Acute asthma exacerbation 04/10/2018   Acute non-recurrent maxillary sinusitis 09/25/2018   Asthma    Asthma exacerbation 08/03/2020   Auditory hallucinations    only after anesthesia   BV (bacterial vaginosis) 02/13/2018   COVID-19 virus infection 08/16/2019   Diabetes mellitus without complication (HCC)    diet controlled   Diabetes mellitus, type II (HCC)    insulin , jardiance    Dyspnea    with exertion   Essential hypertension    Essential hypertension 11/24/2017   GERD (gastroesophageal reflux disease)    occasionally-NO MEDS   History of placement of ear tubes 05/20/2018   Hyperlipidemia    Kidney stone 02/11/2019   Localized skin mass, lump, or swelling 01/26/2021   MDD (major depressive disorder)    Miscarriage    Nausea & vomiting 10/05/2019   PTSD (  post-traumatic stress disorder)    Right lower quadrant abdominal pain 02/13/2018   Tachycardia     Past Surgical History:  Procedure Laterality Date   ABDOMINAL HYSTERECTOMY     ADENOIDECTOMY     CHOLECYSTECTOMY  2009   CYSTOSCOPY N/A 07/31/2018   Procedure: CYSTOSCOPY;  Surgeon: Verdon Keen, MD;  Location: [facility];  Service: Gynecology;  Laterality: N/A;   LAPAROSCOPIC BILATERAL  SALPINGECTOMY Bilateral 07/31/2018   Procedure: LAPAROSCOPIC BILATERAL SALPINGECTOMY;  Surgeon: Verdon Keen, MD;  Location: [facility];  Service: Gynecology;  Laterality: Bilateral;   LAPAROSCOPIC HYSTERECTOMY N/A 07/31/2018   Procedure: HYSTERECTOMY TOTAL LAPAROSCOPIC;  Surgeon: Verdon Keen, MD;  Location: [facility];  Service: Gynecology;  Laterality: N/A;   LAPAROSCOPY N/A 06/07/2019   Procedure: LAPAROSCOPY OPERATIVE, WITH PERITONEAL BIOPSIES;  Surgeon: Verdon Keen, MD;  Location: [facility];  Service: Gynecology;  Laterality: N/A;   LYSIS OF ADHESION N/A 07/31/2018   Procedure: LYSIS OF ADHESION;  Surgeon: Verdon Keen, MD;  Location: [facility];  Service: Gynecology;  Laterality: N/A;   OVARY SURGERY Right    cyst removed a while ago   TONSILLECTOMY     tubes in ear      Family Psychiatric History:   Family History:  Family History  Problem Relation Age of Onset   Asthma Mother    Diabetes Mother    Hyperlipidemia Mother    Hypertension Mother    Diabetes Father    Hyperlipidemia Father    Hypertension Father    Diabetes Brother    Depression Brother    Alcohol abuse Brother    Depression Maternal Grandmother    Stroke Paternal Grandmother    Breast cancer Neg Hx     Social History:  Social History   Socioeconomic History   Marital status: Married    Spouse name: chip   Number of children: 0   Years of education: Not on file   Highest education level: 12th grade  Occupational History   Occupation: day occupational hygienist  Tobacco Use   Smoking status: Never   Smokeless tobacco: Never  Vaping Use   Vaping status: Never Used  Substance and Sexual Activity   Alcohol use: No   Drug use: No   Sexual activity: Yes    Birth control/protection: Surgical  Other Topics Concern   Not on file  Social History Narrative   Not on file   Social Drivers of Health   Financial Resource Strain: Low Risk  (04/27/2018)   Overall Financial Resource Strain (CARDIA)     Difficulty of Paying Living Expenses: Not hard at all  Food Insecurity: No Food Insecurity (12/16/2022)   Hunger Vital Sign    Worried About Running Out of Food in the Last Year: Never true    Ran Out of Food in the Last Year: Never true  Transportation Needs: No Transportation Needs (12/16/2022)   PRAPARE - Administrator, Civil Service (Medical): No    Lack of Transportation (Non-Medical): No  Physical Activity: Insufficiently Active (11/29/2022)   Exercise Vital Sign    Days of Exercise per Week: 4 days    Minutes of Exercise per Session: 30 min  Stress: Stress Concern Present (11/29/2022)   [employer] of Occupational Health - Occupational Stress Questionnaire    Feeling of Stress : Rather much  Social Connections: Socially Integrated (11/29/2022)   Social Connection and Isolation Panel [NHANES]    Frequency of Communication with Friends and Family: Twice a week  Frequency of Social Gatherings with Friends and Family: Twice a week    Attends Religious Services: More than 4 times per year    Active Member of [employer] or Organizations: Yes    Attends Engineer, Structural: More than 4 times per year    Marital Status: Married    Allergies:  Allergies  Allergen Reactions   Ibuprofen Swelling and Other (See Comments)    Facial    Ciprofloxacin Hives and Rash   Metformin  And Related Other (See Comments)    Elevated Lactic Acid   Penicillins Hives, Rash and Other (See Comments)    Has patient had a PCN reaction causing immediate rash, facial/tongue/throat swelling, SOB or lightheadedness with hypotension: No Has patient had a PCN reaction causing severe rash involving mucus membranes or skin necrosis: No Has patient had a PCN reaction that required hospitalization: No Has patient had a PCN reaction occurring within the last 10 years: No If all of the above answers are NO, then may proceed with Cephalosporin use. THE PATIENT IS ABLE TO TOLERATE CEPHALOSPORINS  WITHOUT DIFFIC   Sulfa Antibiotics Hives and Rash   Other Other (See Comments)    ALL NUTS-SCRATCHY THROAT   Shellfish Allergy  Other (See Comments)    ALL SEAFOOD-SCRATCHY THROAT   Trazodone  And Nefazodone Other (See Comments)    Causes me to hear voices    Metabolic Disorder Labs: Lab Results  Component Value Date   HGBA1C 7.5 (A) 07/31/2023   MPG 315 10/22/2022   MPG 298 01/12/2021   Lab Results  Component Value Date   PROLACTIN 11.1 10/22/2022   PROLACTIN 14.5 05/05/2015   Lab Results  Component Value Date   CHOL 247 (H) 10/22/2022   TRIG 297 (H) 10/22/2022   HDL 65 10/22/2022   CHOLHDL 3.8 10/22/2022   VLDL 59 (H) 10/22/2022   LDLCALC 123 (H) 10/22/2022   LDLCALC  09/03/2019     Comment:     . LDL cholesterol not calculated. Triglyceride levels greater than 400 mg/dL invalidate calculated LDL results. . Reference range: <100 . Desirable range <100 mg/dL for primary prevention;   <70 mg/dL for patients with CHD or diabetic patients  with > or = 2 CHD risk factors. SABRA LDL-C is now calculated using the Martin-Hopkins  calculation, which is a validated novel method providing  better accuracy than the Friedewald equation in the  estimation of LDL-C.  Gladis APPLETHWAITE et al. SANDREA. 7986;689(80): 2061-2068  (http://education.[employer].com/faq/FAQ164)    Lab Results  Component Value Date   TSH 1.925 10/22/2022   TSH 1.211 08/03/2020    Therapeutic Level Labs: No results found for: LITHIUM No results found for: VALPROATE No results found for: CBMZ  Current Medications: Current Outpatient Medications  Medication Sig Dispense Refill   albuterol  (VENTOLIN  HFA) 108 (90 Base) MCG/ACT inhaler Inhale 1 puff into the lungs every 4 (four) hours as needed for wheezing or shortness of breath.     atorvastatin  (LIPITOR) 20 MG tablet Take 1 tablet (20 mg total) by mouth daily. for cholesterol. 90 tablet 1   Continuous Glucose Receiver (DEXCOM G7 RECEIVER) DEVI  Use to check blood sugars. 1 each 0   Continuous Glucose Sensor (DEXCOM G7 SENSOR) MISC Change every 10 days to check blood sugars continuously 3 each 3   gabapentin  (NEURONTIN ) 100 MG capsule Take 1 capsule (100 mg total) by mouth 2 (two) times daily as needed. 90 capsule 1   hydrOXYzine  (ATARAX ) 25 MG tablet Take 1 tablet (25  mg total) by mouth 2 (two) times daily. Take 2 tablet  ( 50mg  total ) by mouth at night (Patient not taking: Reported on 07/31/2023) 60 tablet 0   insulin  glargine (LANTUS ) 100 UNIT/ML Solostar Pen Inject 50 units every morning and 10 units every evening for diabetes. 45 mL 0   Insulin  Pen Needle (PEN NEEDLES) 31G X 6 MM MISC Use nightly with insulin . 100 each 3   ipratropium (ATROVENT ) 0.03 % nasal spray Place 2 sprays into both nostrils every 12 (twelve) hours. 30 mL 0   mirtazapine  (REMERON ) 7.5 MG tablet Take 1 tablet (7.5 mg total) by mouth at bedtime. 90 tablet 1   promethazine -dextromethorphan (PROMETHAZINE -DM) 6.25-15 MG/5ML syrup Take 5 mLs by mouth 4 (four) times daily as needed for cough. 118 mL 0   propranolol  ER (INDERAL  LA) 80 MG 24 hr capsule Take 1 capsule (80 mg total) by mouth at bedtime. For headache prevention 90 capsule 1   Semaglutide , 2 MG/DOSE, 8 MG/3ML SOPN Inject 2 mg as directed once a week. for diabetes. 9 mL 0   sertraline  (ZOLOFT ) 50 MG tablet Take 1 tablet (50 mg total) by mouth daily. 90 tablet 1   SUMAtriptan  (IMITREX ) 50 MG tablet Take 1 tablet by mouth at migraine onset. May repeat in 2 hours if headache persists or recurs. 10 tablet 0   valsartan  (DIOVAN ) 80 MG tablet Take 1 tablet (80 mg total) by mouth daily. for blood pressure. 90 tablet 1   No current facility-administered medications for this visit.     Musculoskeletal: Careagility  Psychiatric Specialty Exam: Review of Systems  Psychiatric/Behavioral:  Negative for agitation, sleep disturbance (improving) and suicidal ideas. The patient is nervous/anxious.   All other systems  reviewed and are negative.   Last menstrual period 07/24/2018.There is no height or weight on file to calculate BMI.  General Appearance: Casual  Eye Contact:  Good  Speech:  Clear and Coherent  Volume:  Normal  Mood:  Anxious and Depressed  Affect:  Congruent  Thought Process:  Coherent  Orientation:  Full (Time, Place, and Person)  Thought Content: Logical   Suicidal Thoughts:  No  Homicidal Thoughts:  No  Memory:  Immediate;   Good Recent;   Good  Judgement:  Good  Insight:  Fair  Psychomotor Activity:  Normal  Concentration:  Concentration: Good  Recall:  Good  Fund of Knowledge: Good  Language: Good  Akathisia:  No  Handed:  Right  AIMS (if indicated): not done  Assets:  Communication Skills Desire for Improvement Social Support  ADL's:  Intact  Cognition: WNL  Sleep:  Good   Screenings: AIMS    Flowsheet Row [facility]  AIMS Total Score 0 0      AUDIT    Flowsheet Row [facility]) [facility] Ad[facility] ([facility]  Alcohol Use Disorder Identification Test Final Score (AUDIT) 0 0 0      GAD-7    Flowsheet Row Counselor from 07/29/2023 in [facility] from 06/23/2023 in [facility] at [place] Counselor from 06/09/2023 in [facility] Visit from 03/13/2023 in [facility]  Visit from 11/29/2022 in [facility]  Total GAD-7 Score 7 14 21 14 7       PHQ2-9    Flowsheet Row Counselor from  07/29/2023 in [facility] at [facility] from 06/23/2023 in [facility] from 06/09/2023 in [facility] at [facility] Visit from 05/26/2023 in [facility] Visit from 03/13/2023 in [facility] [place]  PHQ-2 Total Score 1 2 5 4 1   PHQ-9 Total Score 3 9 19 19 10       Flowsheet Row ED from 08/06/2023 in [facility] Counselor from 06/09/2023 in [facility] Visit [facility]  C-SSRS RISK CATEGORY No Risk Low Risk Error: Q3, 4, or 5 should not be populated when Q2 is No        Assessment and Plan: Apollonia Amini 43 year old female presents for medication management follow-up appointment.  Currently she is prescribed Zoloft  mirtazapine  and gabapentin  she reports taking and tolerating well.  States overall her mood has improved and she is restricted spirits.  Denying any suicidal or homicidal ideations.  Denies auditory or visual hallucinations.  Recently started biweekly therapy sessions.  Which she reports she has been attending and feels like she has been progressing in therapy sessions.  Milea requested to decrease her Zoloft  from 100 mg to 50 mg daily.   Major depressive disorder: Generalized anxiety disorder: Sleep disturbance:  Continue Remeron  7.5 mg nightly Continue gabapentin  100 mg p.o. twice daily Tapered Zoloft  100 mg to 50 mg daily  Follow-up 3 months and/or as needed  Collaboration of Care: Collaboration of Care: Medication Management AEB downward titration to Zoloft  100 to 50 mg, patient to follow-up if she is experiencing any akathisia, nervousness restlessness and/or head zaps.  Will make Zoloft  25 mg available in order to titrate to 75 mg.    Patient/Guardian was advised Release of Information must be obtained prior to any record release in order to collaborate their care with an outside provider. Patient/Guardian was advised if they have not already done so to contact the registration department to sign all necessary forms in order for us  to release information regarding their care.   Consent: Patient/Guardian gives verbal consent for treatment and assignment of benefits for services provided during this visit. Patient/Guardian expressed understanding and agreed to proceed.    Staci LOISE Kerns, NP 08/18/2023, 9:04 AM

## 2023-08-20 ENCOUNTER — Ambulatory Visit (INDEPENDENT_AMBULATORY_CARE_PROVIDER_SITE_OTHER): Payer: MEDICAID | Admitting: Licensed Clinical Social Worker

## 2023-08-20 ENCOUNTER — Encounter (HOSPITAL_COMMUNITY): Payer: Self-pay

## 2023-08-20 DIAGNOSIS — Z91199 Patient's noncompliance with other medical treatment and regimen due to unspecified reason: Secondary | ICD-10-CM

## 2023-08-20 MED ORDER — DEXCOM G7 SENSOR MISC: 3 refills | Status: DC

## 2023-08-20 NOTE — Progress Notes (Signed)
 THERAPIST PROGRESS NOTE   Session Date: 08/20/2023  Session Time: 1600  Patient no-showed today's appointment; appointment was for follow up therapy, provider notified for review of record.   Patsi Boots, MSW, LCSW 08/20/2023,  12:17 PM

## 2023-08-26 ENCOUNTER — Ambulatory Visit (HOSPITAL_COMMUNITY): Payer: MEDICAID | Admitting: Licensed Clinical Social Worker

## 2023-08-27 ENCOUNTER — Ambulatory Visit
Admission: EM | Admit: 2023-08-27 | Discharge: 2023-08-27 | Disposition: A | Discharge status: Home / Self Care | Payer: MEDICAID | Attending: Internal Medicine | Admitting: Internal Medicine

## 2023-08-27 DIAGNOSIS — H9209 Otalgia, unspecified ear: Secondary | ICD-10-CM | POA: Diagnosis present

## 2023-08-27 DIAGNOSIS — R059 Cough, unspecified: Secondary | ICD-10-CM | POA: Diagnosis present

## 2023-08-27 DIAGNOSIS — R0602 Shortness of breath: Secondary | ICD-10-CM | POA: Insufficient documentation

## 2023-08-27 DIAGNOSIS — R519 Headache, unspecified: Secondary | ICD-10-CM | POA: Diagnosis present

## 2023-08-27 DIAGNOSIS — J069 Acute upper respiratory infection, unspecified: Secondary | ICD-10-CM | POA: Diagnosis not present

## 2023-08-27 LAB — POCT INFLUENZA A/B
Influenza A, POC: NEGATIVE
Influenza B, POC: NEGATIVE

## 2023-08-27 MED ORDER — ALBUTEROL SULFATE HFA 108 (90 BASE) MCG/ACT IN AERS: 1.0000 | INHALATION_SPRAY | Freq: Four times a day (QID) | RESPIRATORY_TRACT | 0 refills | Status: DC | PRN

## 2023-08-27 MED ORDER — PROMETHAZINE-DM 6.25-15 MG/5ML PO SYRP
5.0000 mL | ORAL_SOLUTION | Freq: Every evening | ORAL | 0 refills | Status: DC | PRN
Start: 2023-08-27 — End: 2023-10-03

## 2023-08-27 NOTE — Discharge Instructions (Signed)
 You have a viral illness which will improve on its own with rest, fluids, and medications to help with your symptoms.  Tylenol, guaifenesin (plain mucinex), and saline nasal sprays may help relieve symptoms.   Two teaspoons of honey in 1 cup of warm water every 4-6 hours may help with throat pains.  Humidifier in room at nighttime may help soothe cough (clean well daily).   For chest pain, shortness of breath, inability to keep food or fluids down without vomiting, fever that does not respond to tylenol or motrin, or any other severe symptoms, please go to the ER for further evaluation. Return to urgent care as needed, otherwise follow-up with PCP.

## 2023-08-27 NOTE — ED Provider Notes (Signed)
[name]    CSN: [id number] Arrival date & time: 08/27/23  1602      History   Chief Complaint Chief Complaint  Patient presents with   Cough    HPI Joan [name] is a 43 y.o. female.   Joan [name] is a 43 y.o. female presenting for chief complaint of cough, congestion, sore throat, and fatigue that started 2 days ago. She was recently in the ER with her husband (husband was being seen in ED) and she wonders if she may have been exposed to viral illness while in the emergency room.  Cough is mostly dry and nonproductive.  Reports intermittent shortness of breath associated with cough.  Denies nausea, vomiting, diarrhea, abdominal pain, rash, and chest pain.  History of asthma, states she has not used her albuterol inhaler as it is about to expire.  She is unsure of documented fever at home but reports intermittent cold chills.  Never smoker.  Taking over-the-counter medications (Tylenol) to help with symptoms with some relief.   Cough   Past Medical History:  Diagnosis Date   Acute asthma exacerbation 04/10/2018   Acute non-recurrent maxillary sinusitis 09/25/2018   Asthma    Asthma exacerbation 08/03/2020   Auditory hallucinations    only after anesthesia   BV (bacterial vaginosis) 02/13/2018   COVID-19 virus infection 08/16/2019   Diabetes mellitus without complication (HCC)    diet controlled   Diabetes mellitus, type II (HCC)    insulin, jardiance   Dyspnea    with exertion   Essential hypertension    Essential hypertension 11/24/2017   GERD (gastroesophageal reflux disease)    occasionally-NO MEDS   History of placement of ear tubes 05/20/2018   Hyperlipidemia    Kidney stone 02/11/2019   Localized skin mass, lump, or swelling 01/26/2021   MDD (major depressive disorder)    Miscarriage    Nausea & vomiting 10/05/2019   PTSD (post-traumatic stress disorder)    Right lower quadrant abdominal pain 02/13/2018   Tachycardia     Patient  Active Problem List   Diagnosis Date Noted   Major depressive disorder, recurrent episode, moderate (HCC) 06/09/2023   Vertigo 05/29/2023   Acute bacterial conjunctivitis of right eye 05/01/2023   Bipolar 2 disorder, major depressive episode (HCC) 10/23/2022   Migraines 01/26/2021   GAD (generalized anxiety disorder) 10/24/2020   Asthma 11/03/2018   Recurrent sinusitis 05/20/2018   Tachycardia 05/04/2018   Uncontrolled type 2 diabetes mellitus with hyperglycemia (HCC) 11/24/2017   Essential hypertension 11/24/2017   Hyperlipidemia 11/24/2017   Major depressive disorder, recurrent, severe without psychotic features (HCC)    PTSD (post-traumatic stress disorder) 04/11/2015    Past Surgical History:  Procedure Laterality Date   ABDOMINAL HYSTERECTOMY     ADENOIDECTOMY     CHOLECYSTECTOMY  2009   CYSTOSCOPY N/A 07/31/2018   Procedure: CYSTOSCOPY;  Surgeon: Christeen Douglas, MD;  Location: [facility];  Service: Gynecology;  Laterality: N/A;   LAPAROSCOPIC BILATERAL SALPINGECTOMY Bilateral 07/31/2018   Procedure: LAPAROSCOPIC BILATERAL SALPINGECTOMY;  Surgeon: Christeen Douglas, MD;  Location: [facility];  Service: Gynecology;  Laterality: Bilateral;   LAPAROSCOPIC HYSTERECTOMY N/A 07/31/2018   Procedure: HYSTERECTOMY TOTAL LAPAROSCOPIC;  Surgeon: Christeen Douglas, MD;  Location: [facility];  Service: Gynecology;  Laterality: N/A;   LAPAROSCOPY N/A 06/07/2019   Procedure: LAPAROSCOPY OPERATIVE, WITH PERITONEAL BIOPSIES;  Surgeon: Christeen Douglas, MD;  Location: [facility];  Service: Gynecology;  Laterality: N/A;   LYSIS OF ADHESION N/A 07/31/2018  Procedure: LYSIS OF ADHESION;  Surgeon: Christeen Douglas, MD;  Location: [facility];  Service: Gynecology;  Laterality: N/A;   OVARY SURGERY Right    cyst removed a while ago   TONSILLECTOMY     tubes in ear      OB History   No obstetric history on file.      Home Medications    Prior to Admission medications   Medication Sig Start Date End  Date Taking? Authorizing Provider  albuterol (VENTOLIN HFA) 108 (90 Base) MCG/ACT inhaler Inhale 1-2 puffs into the lungs every 6 (six) hours as needed for wheezing or shortness of breath. 08/27/23  Yes Carlisle Beers, FNP  promethazine-dextromethorphan (PROMETHAZINE-DM) 6.25-15 MG/5ML syrup Take 5 mLs by mouth at bedtime as needed for cough. 08/27/23  Yes Carlisle Beers, FNP  atorvastatin (LIPITOR) 20 MG tablet Take 1 tablet (20 mg total) by mouth daily. for cholesterol. 05/01/23   Doreene Nest, NP  Continuous Glucose Receiver (DEXCOM G7 RECEIVER) DEVI Use to check blood sugars. 05/02/23   Doreene Nest, NP  Continuous Glucose Sensor (DEXCOM G7 SENSOR) MISC Change every 10 days to check blood sugars continuously 08/20/23   Doreene Nest, NP  gabapentin (NEURONTIN) 100 MG capsule Take 1 capsule (100 mg total) by mouth 2 (two) times daily as needed. 08/18/23 11/16/23  Oneta Rack, NP  hydrOXYzine (ATARAX) 25 MG tablet Take 1 tablet (25 mg total) by mouth 2 (two) times daily. Take 2 tablet  ( 50mg  total ) by mouth at night Patient not taking: Reported on 07/31/2023 05/28/23   Oneta Rack, NP  insulin glargine (LANTUS) 100 UNIT/ML Solostar Pen Inject 50 units every morning and 10 units every evening for diabetes. 06/30/23   Doreene Nest, NP  Insulin Pen Needle (PEN NEEDLES) 31G X 6 MM MISC Use nightly with insulin. 05/01/23   Doreene Nest, NP  ipratropium (ATROVENT) 0.03 % nasal spray Place 2 sprays into both nostrils every 12 (twelve) hours. 08/06/23   Radford Pax, NP  mirtazapine (REMERON) 7.5 MG tablet Take 1 tablet (7.5 mg total) by mouth at bedtime. 08/18/23   Oneta Rack, NP  propranolol ER (INDERAL LA) 80 MG 24 hr capsule Take 1 capsule (80 mg total) by mouth at bedtime. For headache prevention 05/01/23   Doreene Nest, NP  Semaglutide, 2 MG/DOSE, 8 MG/3ML SOPN Inject 2 mg as directed once a week. for diabetes. 07/31/23   Doreene Nest, NP   sertraline (ZOLOFT) 50 MG tablet Take 1 tablet (50 mg total) by mouth daily. 08/18/23 08/17/24  Oneta Rack, NP  SUMAtriptan (IMITREX) 50 MG tablet Take 1 tablet by mouth at migraine onset. May repeat in 2 hours if headache persists or recurs. 05/01/23   Doreene Nest, NP  valsartan (DIOVAN) 80 MG tablet Take 1 tablet (80 mg total) by mouth daily. for blood pressure. 05/01/23   Doreene Nest, NP    Family History Family History  Problem Relation Age of Onset   Asthma Mother    Diabetes Mother    Hyperlipidemia Mother    Hypertension Mother    Diabetes Father    Hyperlipidemia Father    Hypertension Father    Diabetes Brother    Depression Brother    Alcohol abuse Brother    Depression Maternal Grandmother    Stroke Paternal Grandmother    Breast cancer Neg Hx     Social History Social History   Tobacco  Use   Smoking status: Never   Smokeless tobacco: Never  Vaping Use   Vaping status: Never Used  Substance Use Topics   Alcohol use: No   Drug use: No     Allergies   Ibuprofen, Ciprofloxacin, Metformin and related, Penicillins, Sulfa antibiotics, Other, Shellfish allergy, and Trazodone and nefazodone   Review of Systems Review of Systems  Respiratory:  Positive for cough.   Per HPI   Physical Exam Triage Vital Signs ED Triage Vitals  Encounter Vitals Group     BP 08/27/23 1632 121/89     Systolic BP Percentile --      Diastolic BP Percentile --      Pulse Rate 08/27/23 1632 (!) 103     Resp 08/27/23 1632 16     Temp 08/27/23 1632 97.9 F (36.6 C)     Temp Source 08/27/23 1632 Oral     SpO2 08/27/23 1632 97 %     Weight --      Height --      Head Circumference --      Peak Flow --      Pain Score 08/27/23 1634 8     Pain Loc --      Pain Education --      Exclude from Growth Chart --    No data found.  Updated Vital Signs BP 121/89 (BP Location: Left Arm)   Pulse (!) 103   Temp 97.9 F (36.6 C) (Oral)   Resp 16   LMP 07/24/2018  (Exact Date) Comment: surgery 07/31/2018  SpO2 97%   Visual Acuity Right Eye Distance:   Left Eye Distance:   Bilateral Distance:    Right Eye Near:   Left Eye Near:    Bilateral Near:     Physical Exam Vitals and nursing note reviewed.  Constitutional:      Appearance: She is not ill-appearing or toxic-appearing.  HENT:     Head: Normocephalic and atraumatic.     Right Ear: Hearing, tympanic membrane, ear canal and external ear normal.     Left Ear: Hearing, tympanic membrane, ear canal and external ear normal.     Nose: Congestion present.     Mouth/Throat:     Lips: Pink.     Mouth: Mucous membranes are moist. No injury or oral lesions.     Dentition: Normal dentition.     Tongue: No lesions.     Pharynx: Oropharynx is clear. Uvula midline. No pharyngeal swelling, oropharyngeal exudate, posterior oropharyngeal erythema, uvula swelling or postnasal drip.     Tonsils: No tonsillar exudate.  Eyes:     General: Lids are normal. Vision grossly intact. Gaze aligned appropriately.     Extraocular Movements: Extraocular movements intact.     Conjunctiva/sclera: Conjunctivae normal.  Neck:     Trachea: Trachea and phonation normal.  Cardiovascular:     Rate and Rhythm: Normal rate and regular rhythm.     Heart sounds: Normal heart sounds, S1 normal and S2 normal.  Pulmonary:     Effort: Pulmonary effort is normal. No respiratory distress.     Breath sounds: Normal breath sounds and air entry. No wheezing, rhonchi or rales.  Chest:     Chest wall: No tenderness.  Musculoskeletal:     Cervical back: Neck supple.  Lymphadenopathy:     Cervical: No cervical adenopathy.  Skin:    General: Skin is warm and dry.     Capillary Refill: Capillary refill takes less than 2  seconds.     Findings: No rash.  Neurological:     General: No focal deficit present.     Mental Status: She is alert and oriented to person, place, and time. Mental status is at baseline.     Cranial Nerves: No  dysarthria or facial asymmetry.  Psychiatric:        Mood and Affect: Mood normal.        Speech: Speech normal.        Behavior: Behavior normal.        Thought Content: Thought content normal.        Judgment: Judgment normal.      UC Treatments / Results  Labs (all labs ordered are listed, but only abnormal results are displayed) Labs Reviewed  SARS CORONAVIRUS 2 (TAT 6-24 HRS)  POCT INFLUENZA A/B    EKG   Radiology No results found.  Procedures Procedures (including critical care time)  Medications Ordered in UC Medications - No data to display  Initial Impression / Assessment and Plan / UC Course  I have reviewed the triage vital signs and the nursing notes.  Pertinent labs & imaging results that were available during my care of the patient were reviewed by me and considered in my medical decision making (see chart for details).   1.  Viral URI with cough Suspect viral URI, viral syndrome.  Strep/viral testing: POC flu negative, PCR COVID testing pending.  Albuterol inhaler refilled to be used as needed for shortness of breath. No current signs of asthma exacerbation.  Physical exam findings reassuring, vital signs hemodynamically stable, and lungs clear, therefore deferred imaging of the chest.  Advised supportive care/prescriptions for symptomatic relief as outlined in AVS.    Counseled patient on potential for adverse effects with medications prescribed/recommended today, strict ER and return-to-clinic precautions discussed, patient verbalized understanding.    Final Clinical Impressions(s) / UC Diagnoses   Final diagnoses:  Viral URI with cough     Discharge Instructions      You have a viral illness which will improve on its own with rest, fluids, and medications to help with your symptoms.  Tylenol, guaifenesin (plain mucinex), and saline nasal sprays may help relieve symptoms.   Two teaspoons of honey in 1 cup of warm water every 4-6 hours  may help with throat pains.  Humidifier in room at nighttime may help soothe cough (clean well daily).   For chest pain, shortness of breath, inability to keep food or fluids down without vomiting, fever that does not respond to tylenol or motrin, or any other severe symptoms, please go to the ER for further evaluation. Return to urgent care as needed, otherwise follow-up with PCP.     ED Prescriptions     Medication Sig Dispense Auth. Provider   albuterol (VENTOLIN HFA) 108 (90 Base) MCG/ACT inhaler Inhale 1-2 puffs into the lungs every 6 (six) hours as needed for wheezing or shortness of breath. 8 g Reita May M, FNP   promethazine-dextromethorphan (PROMETHAZINE-DM) 6.25-15 MG/5ML syrup Take 5 mLs by mouth at bedtime as needed for cough. 118 mL Carlisle Beers, FNP      PDMP not reviewed this encounter.   Carlisle Beers, [place] 08/27/23 1702

## 2023-08-27 NOTE — ED Triage Notes (Signed)
Pt states headache,cough,chills and ear pain for the past 2 days. States she has been taking tylenol at home.

## 2023-08-28 ENCOUNTER — Telehealth (HOSPITAL_COMMUNITY): Payer: Self-pay

## 2023-08-28 LAB — SARS CORONAVIRUS 2 (TAT 6-24 HRS): SARS Coronavirus 2: POSITIVE — AB

## 2023-08-28 MED ORDER — PAXLOVID (300/100) 20 X 150 MG & 10 X 100MG PO TBPK: 3.0000 | ORAL_TABLET | Freq: Two times a day (BID) | ORAL | 0 refills | Status: AC

## 2023-08-28 MED ORDER — PAXLOVID (300/100) 20 X 150 MG & 10 X 100MG PO TBPK: 3.0000 | ORAL_TABLET | Freq: Two times a day (BID) | ORAL | 0 refills | Status: DC

## 2023-08-28 NOTE — Telephone Encounter (Signed)
Per Wardell Honour, NP, "She can have normal dosing for Paxlovid but she has to stop taking Atorvastatin and hydroxyzine while taking. She may resume these medications after her paxlvoid is finished.  Last GFR greater than 60 based on recent BMP.  She needs to pick this up and start today."  TC to pt. Advised of NP's instructions. Verbalized understanding.

## 2023-08-28 NOTE — Addendum Note (Signed)
Addended by: Warren Danes on: 08/28/2023 12:25 PM   Modules accepted: Orders

## 2023-08-28 NOTE — Addendum Note (Signed)
Addended by: Warren Danes on: 08/28/2023 12:22 PM   Modules accepted: Orders

## 2023-08-28 NOTE — Telephone Encounter (Signed)
Meds resent d/t e-prescribe error.

## 2023-09-09 ENCOUNTER — Other Ambulatory Visit (HOSPITAL_COMMUNITY): Payer: Self-pay | Admitting: Family

## 2023-09-10 ENCOUNTER — Ambulatory Visit (INDEPENDENT_AMBULATORY_CARE_PROVIDER_SITE_OTHER): Payer: MEDICAID | Admitting: Licensed Clinical Social Worker

## 2023-09-10 ENCOUNTER — Other Ambulatory Visit: Payer: Self-pay

## 2023-09-10 DIAGNOSIS — F331 Major depressive disorder, recurrent, moderate: Secondary | ICD-10-CM | POA: Diagnosis not present

## 2023-09-10 DIAGNOSIS — F3181 Bipolar II disorder: Secondary | ICD-10-CM

## 2023-09-10 DIAGNOSIS — E1165 Type 2 diabetes mellitus with hyperglycemia: Secondary | ICD-10-CM

## 2023-09-10 NOTE — Telephone Encounter (Signed)
 Kelli, will you schedule her for this Friday at 3:20 pm? Potential UTI

## 2023-09-10 NOTE — Telephone Encounter (Signed)
 Patient scheduled.

## 2023-09-10 NOTE — Progress Notes (Signed)
 THERAPIST PROGRESS NOTE   Session Date: 09/10/2023  Session Time: 8389-8343  Participation Level: Active  Behavioral Response: Casual, Neat, and Well GroomedAlertEuthymic  Type of Therapy: Individual Therapy  Treatment Goals addressed:  - LTG: Reduce frequency, intensity, and duration of depression symptoms so that daily functioning is improved (OP Depression) - LTG: Increase coping skills to manage depression and improve ability to perform daily activities (OP Depression) - STG: [name] will identify cognitive patterns and beliefs that support depression (OP Depression) - STG: [name] will reduce frequency of avoidant behaviors by 50% as evidenced by self-report in therapy sessions (Anxiety) - LTG: Work on changing thought process by challenging automatic negative thoughts and  anxious thoughts in order to improve outlook and perspective (OP Depression) - LTG: [name] will score less than 5 on the Generalized Anxiety Disorder 7 Scale (GAD-7) (Anxiety) - STG: Report a decrease in anxiety symptoms as evidenced by an overall reduction in anxiety score by a minimum of 25% on the Generalized Anxiety Disorder Scale (GAD-7) (Anxiety)   ProgressTowards Goals: Progressing  Interventions: CBT, Motivational Interviewing, and Supportive  Summary: Joan [name] is a 43 y.o. female with past psych history of MDD, GAD, Bipolar 2, presenting for follow-up therapy session in efforts to improve management of depressive and anxious symptoms. Patient actively engaged in session, presenting in pleasant moods and congruent affect throughout visit. Pt actively engaged in re-administering of PHQ-9 and GAD-7, reviewing current scores, processing trends and variances in observed sxs.  - Pt provided recounts of past six weeks since previous visit, detailing events, sharing of various challenges and increased stressors within timeframe and overall abilities in managing depressive and anxious sxs. Pt shared stressors to include  becoming unwell twice over the past month, husband experiencing a mild stroke when receiving infusion tx, and not getting a particular job earning more due to not having certain certification. - Engaged in processing ways in which pt has been managing stressors, identifying various means of relaxation to include journaling, and taking time to decompress. Processed recent challenges surrounding consistency of journaling due to illnesses, but pt acknowledged progress when implementing. - Engaged in processing instances of  negative thoughts and/or feeling bad about self and poor self-esteem, exploring pt's understanding of cognitive distortions and negative thoughts, relationship between thoughts, feelings, and behaviors, and the cycle of negative thoughts due to distorted thought patterns proving to support depression. Provided Challenging Negative Thoughts activity, identifying example of negative thought, exploring evidence, contrary factors, alternate perspectives, and lasting implications of thoughts in support of aiding pt in exploring means of shifting perspectives and/or outlook.   Patient continues to meet criteria for MDD, GAD, Bipolar 2. Patient will continue to benefit from engagement in outpatient therapy due to being the least restrictive service to meet presenting needs.      09/10/2023    4:14 PM 07/29/2023    8:13 AM 06/23/2023    4:13 PM 06/09/2023    3:58 PM 05/26/2023    4:32 PM  Depression screen PHQ 2/9  Decreased Interest 0 0 0 2 1  Down, Depressed, Hopeless 1 1 2 3 3   PHQ - 2 Score 1 1 2 5 4   Altered sleeping 0 0 3 3 3   Tired, decreased energy 0 1 3 2 3   Change in appetite 0 0  2 3  Feeling bad or failure about yourself  1 1 0 2 2  Trouble concentrating 0 0 1 2 2   Moving slowly or fidgety/restless 0 0 0 1 0  Suicidal thoughts 0 0 0 2 2  PHQ-9 Score 2 3 9 19 19   Difficult doing work/chores Not difficult at all Not difficult at all Somewhat difficult Somewhat difficult  Somewhat difficult      09/10/2023    4:12 PM 07/29/2023    8:06 AM 06/23/2023    4:08 PM 06/09/2023    3:56 PM  GAD 7 : Generalized Anxiety Score  Nervous, Anxious, on Edge 1 1 3 3   Control/stop worrying 0 1 1 3   Worry too much - different things 1 3 3 3   Trouble relaxing 0 0 3 3  Restless 0 0 0 3  Easily annoyed or irritable 1 1 2 3   Afraid - awful might happen 0 1 2 3   Total GAD 7 Score 3 7 14 21   Anxiety Difficulty Not difficult at all Not difficult at all Not difficult at all Very difficult    Suicidal/Homicidal: No  Therapist Response: Clinician utilized CBT, MI, and supportive reflection interventions to support pt in navigating depressive and anxious sxs. Actively engaged pt in check-in, assessing presenting moods and affect. Re-administered PHQ-9 and GAD-7, engaging pt in further processing of scores, trends, and variances in reported sxs. Evoked pt's recounts of events since previous visit, actively listening to pt's details, eliciting thoughts and feelings in relation to reported stressors, supporting pt in navigating thoughts and feelings. Introduced cognitive distortions, exploring pt's understanding and processing impact of negative thoughts, utilizing Challenging Negative Thoughts activity to support pt in assessing thoughts for validity.   Clinician reassessed severity of sxs, and presence of any safety concerns. Clinician provided support and empathy to patient during session.   Plan: Return again in 2 weeks.  Diagnosis:  Encounter Diagnoses  Name Primary?   Major depressive disorder, recurrent episode, moderate (HCC) Yes   Bipolar 2 disorder (HCC)     Collaboration of Care: Other None necessary at this time.  Patient/Guardian was advised Release of Information must be obtained prior to any record release in order to collaborate their care with an outside provider. Patient/Guardian was advised if they have not already done so to contact the registration  department to sign all necessary forms in order for us  to release information regarding their care.   Consent: Patient/Guardian gives verbal consent for treatment and assignment of benefits for services provided during this visit. Patient/Guardian expressed understanding and agreed to proceed.   Lynwood JONETTA Maris, MSW, LCSW 09/10/2023,  9:45 PM

## 2023-09-11 ENCOUNTER — Other Ambulatory Visit: Payer: Self-pay

## 2023-09-11 DIAGNOSIS — E1165 Type 2 diabetes mellitus with hyperglycemia: Secondary | ICD-10-CM

## 2023-09-12 ENCOUNTER — Encounter: Payer: Self-pay | Admitting: Primary Care

## 2023-09-12 ENCOUNTER — Ambulatory Visit (INDEPENDENT_AMBULATORY_CARE_PROVIDER_SITE_OTHER): Payer: MEDICAID | Admitting: Primary Care

## 2023-09-12 VITALS — BP 110/78 | HR 115 | Temp 97.2°F | Ht 66.0 in | Wt 293.0 lb

## 2023-09-12 DIAGNOSIS — G8929 Other chronic pain: Secondary | ICD-10-CM | POA: Insufficient documentation

## 2023-09-12 DIAGNOSIS — R300 Dysuria: Secondary | ICD-10-CM | POA: Diagnosis not present

## 2023-09-12 DIAGNOSIS — M79672 Pain in left foot: Secondary | ICD-10-CM

## 2023-09-12 DIAGNOSIS — Z794 Long term (current) use of insulin: Secondary | ICD-10-CM | POA: Diagnosis not present

## 2023-09-12 DIAGNOSIS — E1165 Type 2 diabetes mellitus with hyperglycemia: Secondary | ICD-10-CM

## 2023-09-12 LAB — POCT URINE DIPSTICK
Bilirubin, UA: NEGATIVE
Blood, UA: NEGATIVE
Glucose, UA: NEGATIVE mg/dL
Ketones, POC UA: NEGATIVE mg/dL
Leukocytes, UA: NEGATIVE
Nitrite, UA: NEGATIVE
POC PROTEIN,UA: NEGATIVE
Spec Grav, UA: <=1.005 — AB (ref 1.010–1.025)
Urobilinogen, UA: 0.2 U/dL
pH, UA: 6 (ref 5.0–8.0)

## 2023-09-12 MED ORDER — INSULIN GLARGINE 100 UNIT/ML SOLOSTAR PEN: PEN_INJECTOR | SUBCUTANEOUS | 0 refills | Status: DC

## 2023-09-12 NOTE — Assessment & Plan Note (Addendum)
 Uncontrolled blood sugars on current medications secondary to poor diet. Discussed to work on diet, limit snacks, sweets, junk food.  Reviewed blood sugars and Dexcom readings  Last A1c 7.5% (07/2023)  increase lantus  to 50U QAM and 15U at bedtime; increase to 20 units in 1 week if needed. Continue other current medications   Follow up with home blood sugar readings in 1 week via MyChart  Follow up in 1 month in office for diabetes check and routine lab work  I evaluated patient, was consulted regarding treatment, and agree with assessment and plan per Renea Schoonmaker, MSN, FNP student.   Mallie Gaskins, NP-C

## 2023-09-12 NOTE — Progress Notes (Signed)
 Acute Office Visit  Subjective:     Patient ID: Joan [name], female    DOB: [birth year], 43 y.o.   MRN: [id number]  Chief Complaint  Patient presents with   Dysuria    Burning with urination, back pain, side pain x5 day   Foot Injury    Left heel pain intermittent x2 years    [name] is a pleasant 43 year old female with history of hypertension, type 2 diabetes mellitus, asthma, hyperlipidemia, GAD, MDD, bipolar disorder who presents today to discuss dysuria, right pelvic pain, radiating to right lower back x5 days. She would also like to discuss chronic intermittent left heel pain x2 years.  #1 Dysuria and right pelvic/lower back pain x5 days - Burning with urination. Pain is worse when bladder is full and somewhat relieved with urination. There is no increased frequency or urgency from baseline. No malodorous urine. Notes that urine is darker in color. No vaginal discharge or itching. No diarrhea, nausea, vomiting or constipation. Notes right pelvic tenderness, radiating to right lumbosacral region.  #2 Chronic Left posterior achilles and heel pain x2 days - Chronic concern with reported heel spurs - Pain is persistent at rest and with activity and limits ability to exercise - Was evaluated by [facility] 10/2022 for similar issue but was unable to complete imaging studies due to insurance coverage  #3 Type 2 diabetes mellitus with hyperglycemia  - Compliant with Lantus  50U QAM and 10U at bedtime and Semaglutide  2mg /34mL once weekly - She reports her appetite has increased when she increased to the 1mg  and 2mg  dose - She reports diet high in carbohydrates (see below) - She has minimal exercise as complication of #2 (see above)  Dexcom report: 14 days - 56% in range 30 days - 53% in range 3 months - 68% in range  Home blood sugar readings: AM blood sugars 120s PM blood sugars 250-300 HS blood sugars 300-350  Diet currently consists of:  Breakfast: fast food break sandwich  - sausage egg and cheese Lunch: chips, pork rings, cupcakes Dinner: boiled chicken, green beans Snacks: none Desserts: sherbert Beverages: approximately 44oz of diet pepsi daily and approximately 40oz water daily  Exercise: none   Review of Systems  Constitutional: Negative.  Negative for weight loss.  Respiratory: Negative.    Cardiovascular: Negative.   Gastrointestinal:  Negative for abdominal pain, constipation, diarrhea, nausea and vomiting.       BM every 1-2 days  Genitourinary:  Positive for dysuria and flank pain. Negative for frequency, hematuria and urgency.       Negative for vaginal itching and discharge.   Skin: Negative.   Neurological: Negative.  Negative for sensory change.    Wt Readings from Last 3 Encounters:  09/12/23 132.9 kg  07/31/23 131.1 kg  05/29/23 132.5 kg       Objective:    BP 110/78   Pulse (!) 115   Temp (!) 97.2 F (36.2 C) (Temporal)   Ht 5' 6 (1.676 m)   Wt 132.9 kg   LMP 07/24/2018 (Exact Date) Comment: surgery 07/31/2018  SpO2 98%   BMI 47.29 kg/m    Physical Exam Constitutional:      General: She is not in acute distress.    Appearance: Normal appearance. She is obese.  Cardiovascular:     Rate and Rhythm: Normal rate and regular rhythm.  Pulmonary:     Effort: Pulmonary effort is normal.     Breath sounds: Normal breath sounds.  Abdominal:  Palpations: Abdomen is soft.     Comments: Right lower pelvic pain and tenderness to palpation, radiating to right lumbosacral region  Musculoskeletal:        General: Tenderness present.     Cervical back: Normal range of motion.     Comments: Tenderness on palpation of left posterior achilles tendon and left heel  Skin:    General: Skin is warm and dry.  Neurological:     General: No focal deficit present.     Mental Status: She is alert and oriented to person, place, and time.     Results for orders placed or performed in visit on 09/12/23  POCT URINE DIPSTICK   Result Value Ref Range   Color, UA yellow yellow   Clarity, UA clear clear   Glucose, UA negative negative mg/dL   Bilirubin, UA negative negative   Ketones, POC UA negative negative mg/dL   Spec Grav, UA <=8.994 (A) 1.010 - 1.025   Blood, UA negative negative   pH, UA 6.0 5.0 - 8.0   POC PROTEIN,UA negative negative, trace   Urobilinogen, UA 0.2 0.2 or 1.0 E.U./dL   Nitrite, UA Negative Negative   Leukocytes, UA Negative Negative        Assessment & Plan:   Problem List Items Addressed This Visit       Endocrine   Uncontrolled type 2 diabetes mellitus with hyperglycemia (HCC)   Uncontrolled blood sugars on current medications and diet regimen  Blood sugars and Dexcom readings per HPI Last A1c 7.5% (07/2023)  Plan: increase lantus  to 50U QAM and 15U at bedtime; continue other current medications   Follow up with home blood sugar readings in 1 week via MyChart  Follow up in 1 month in office for diabetes check and routine lab work        Other   Chronic heel pain, left - Primary   Symptoms uncontrolled and unable to take NSAIDs d/t allergy   Last seen by [facility] in 10/2022 (notes reviewed) Re-referral for evaluation      Relevant Orders   Ambulatory referral to Orthopedic Surgery   Dysuria   POCT UA negative for leukocytes, nitrites, blood and glucose  Symptoms suggestive of dysuria due to dehydration per HPI & PE with negative UA, afebrile  Encouraged adequate hydration with appropriate fluids, increasing water intake   Follow up as scheduled next month, routine lab work to be collected at that time      Relevant Orders   POCT URINE DIPSTICK (Completed)    No orders of the defined types were placed in this encounter.   Follow up as scheduled for diabetes check  Andriette CHRISTELLA Eke, RN

## 2023-09-12 NOTE — Assessment & Plan Note (Addendum)
 POCT UA negative for leukocytes, nitrites, blood and glucose  Encouraged adequate hydration with appropriate fluids, increasing water intake   Follow up as scheduled next month, routine lab work to be collected at that time  I evaluated patient, was consulted regarding treatment, and agree with assessment and plan per Emarie Paul, MSN, FNP student.   Mallie Gaskins, NP-C

## 2023-09-12 NOTE — Assessment & Plan Note (Addendum)
 Known bone spurs, also potentially achilles tendonitis. Symptoms uncontrolled.   Reviewed [facility] office visit from 10/2022  New referral placed for evaluation  I evaluated patient, was consulted regarding treatment, and agree with assessment and plan per Andretta Ergle, MSN, FNP student.   Mallie Gaskins, NP-C
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
## 2023-09-12 NOTE — Patient Instructions (Addendum)
 Increase Lantus  to 15units in the evening Continue Lantus  50units in the morning  Report blood sugars in MyChart in a week  Continue all other current medications  Continue to work on dietary modifications and increasing water intake  Referral to [facility] for your foot You will get a call to schedule this appointment   Follow up next month as scheduled

## 2023-09-16 ENCOUNTER — Encounter: Payer: Self-pay | Admitting: *Deleted

## 2023-09-16 ENCOUNTER — Ambulatory Visit (INDEPENDENT_AMBULATORY_CARE_PROVIDER_SITE_OTHER): Payer: MEDICAID | Admitting: Physician Assistant

## 2023-09-16 ENCOUNTER — Encounter: Payer: Self-pay | Admitting: Physician Assistant

## 2023-09-16 ENCOUNTER — Other Ambulatory Visit (INDEPENDENT_AMBULATORY_CARE_PROVIDER_SITE_OTHER): Payer: MEDICAID

## 2023-09-16 DIAGNOSIS — M7662 Achilles tendinitis, left leg: Secondary | ICD-10-CM | POA: Diagnosis not present

## 2023-09-16 DIAGNOSIS — M7660 Achilles tendinitis, unspecified leg: Secondary | ICD-10-CM

## 2023-09-16 NOTE — Progress Notes (Signed)
Office Visit Note   Patient: Joan [name]           Date of Birth: October 13, 1980           MRN: [id number] Visit Date: 09/16/2023              Requested by: Doreene Nest, NP [address] [place],  [place] [address] PCP: Doreene Nest, NP   Assessment & Plan: Visit Diagnoses:  1. 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  Comes in today with a 2-year history of chronic posterior heel pain on the left.  Denies any injuries.  She has tried multiple treatments including immobilization in a boot as well as physical therapy without much improvement.  She is a diabetic but has her hemoglobin A1c is now at 7.5.  I spoke with Dr. Lajoyce Corners she has limited options but he recommended that she try at least shockwave therapy once which might be quite helpful to her.  She is a diabetic and certainly would present more risk of wound complications.  If this is not helpful to her would recommend follow-up with Dr. Lajoyce Corners she does not need an MRI  Follow-Up Instructions: Return in about 2 weeks (around 09/30/2023).   Orders:  Orders Placed This Encounter  Procedures   XR Ankle 2 Views Left   AMB referral to sports medicine   No orders of the defined types were placed in this encounter.     Procedures: No procedures performed   Clinical Data: No additional findings.   Subjective: Chief Complaint  Patient presents with   Left Foot - Pain    HPI Pleasant 43 year old woman with a 2-year history of left posterior heel pain no particular injury has tried various treatments including physical therapy and immobilization Review of Systems  All other systems reviewed and are negative.    Objective: Vital Signs: LMP 07/24/2018 (Exact Date) Comment: surgery 07/31/2018  Physical Exam Constitutional:      Appearance: Normal appearance.  Pulmonary:     Effort: Pulmonary effort is normal.  Skin:    General: Skin is warm and dry.  Neurological:      General: No focal deficit present.     Mental Status: She is alert and oriented to person, place, and time.  Psychiatric:        Mood and Affect: Mood normal.        Behavior: Behavior normal.     Ortho Exam Examination of her left ankle she has no erythema she has some swelling Achilles is intact but she has pain over the distal Achilles insertion into the calcaneus.  She has pain increased with dorsiflexion and plantarflexion.  She has a strong pulse no evidence of cellulitis compartments are soft and nontender Specialty Comments:  No specialty comments available.  Imaging: XR Ankle 2 Views Left Result Date: 09/16/2023 2 views of her left ankle demonstrate posterior and plantar spurring.  Also Haglund's deformity no acute fractures    PMFS History: Patient Active Problem List   Diagnosis Date Noted   Chronic heel pain, left 09/12/2023   Dysuria 09/12/2023   Major depressive disorder, recurrent episode, moderate (HCC) 06/09/2023   Vertigo 05/29/2023   Bipolar 2 disorder, major depressive episode (HCC) 10/23/2022   Migraines 01/26/2021   GAD (generalized anxiety disorder) 10/24/2020   Asthma 11/03/2018   Recurrent sinusitis 05/20/2018   Tachycardia 05/04/2018   Uncontrolled type 2 diabetes  mellitus with hyperglycemia (HCC) 11/24/2017   Essential hypertension 11/24/2017   Hyperlipidemia 11/24/2017   Major depressive disorder, recurrent, severe without psychotic features (HCC)    PTSD (post-traumatic stress disorder) 04/11/2015   Past Medical History:  Diagnosis Date   Acute asthma exacerbation 04/10/2018   Acute bacterial conjunctivitis of right eye 05/01/2023   Acute non-recurrent maxillary sinusitis 09/25/2018   Asthma    Asthma exacerbation 08/03/2020   Auditory hallucinations    only after anesthesia   BV (bacterial vaginosis) 02/13/2018   COVID-19 virus infection 08/16/2019   Diabetes mellitus without complication (HCC)    diet controlled   Diabetes mellitus,  type II (HCC)    insulin, jardiance   Dyspnea    with exertion   Essential hypertension    Essential hypertension 11/24/2017   GERD (gastroesophageal reflux disease)    occasionally-NO MEDS   History of placement of ear tubes 05/20/2018   Hyperlipidemia    Kidney stone 02/11/2019   Localized skin mass, lump, or swelling 01/26/2021   MDD (major depressive disorder)    Miscarriage    Nausea & vomiting 10/05/2019   PTSD (post-traumatic stress disorder)    Right lower quadrant abdominal pain 02/13/2018   Tachycardia     Family History  Problem Relation Age of Onset   Asthma Mother    Diabetes Mother    Hyperlipidemia Mother    Hypertension Mother    Diabetes Father    Hyperlipidemia Father    Hypertension Father    Diabetes Brother    Depression Brother    Alcohol abuse Brother    Depression Maternal Grandmother    Stroke Paternal Grandmother    Breast cancer Neg Hx     Past Surgical History:  Procedure Laterality Date   ABDOMINAL HYSTERECTOMY     ADENOIDECTOMY     CHOLECYSTECTOMY  2009   CYSTOSCOPY N/A 07/31/2018   Procedure: CYSTOSCOPY;  Surgeon: Christeen Douglas, MD;  Location: [facility];  Service: Gynecology;  Laterality: N/A;   LAPAROSCOPIC BILATERAL SALPINGECTOMY Bilateral 07/31/2018   Procedure: LAPAROSCOPIC BILATERAL SALPINGECTOMY;  Surgeon: Christeen Douglas, MD;  Location: [facility];  Service: Gynecology;  Laterality: Bilateral;   LAPAROSCOPIC HYSTERECTOMY N/A 07/31/2018   Procedure: HYSTERECTOMY TOTAL LAPAROSCOPIC;  Surgeon: Christeen Douglas, MD;  Location: [facility];  Service: Gynecology;  Laterality: N/A;   LAPAROSCOPY N/A 06/07/2019   Procedure: LAPAROSCOPY OPERATIVE, WITH PERITONEAL BIOPSIES;  Surgeon: Christeen Douglas, MD;  Location: [facility];  Service: Gynecology;  Laterality: N/A;   LYSIS OF ADHESION N/A 07/31/2018   Procedure: LYSIS OF ADHESION;  Surgeon: Christeen Douglas, MD;  Location: [facility];  Service: Gynecology;  Laterality: N/A;   OVARY SURGERY  Right    cyst removed a while ago   TONSILLECTOMY     tubes in ear     Social History   Occupational History   Occupation: day care worker  Tobacco Use   Smoking status: Never   Smokeless tobacco: Never  Vaping Use   Vaping status: Never Used  Substance and Sexual Activity   Alcohol use: No   Drug use: No   Sexual activity: Yes    Birth control/protection: Surgical

## 2023-09-19 ENCOUNTER — Ambulatory Visit: Payer: MEDICAID | Admitting: Orthopaedic Surgery

## 2023-09-24 ENCOUNTER — Ambulatory Visit (INDEPENDENT_AMBULATORY_CARE_PROVIDER_SITE_OTHER): Payer: MEDICAID | Admitting: Licensed Clinical Social Worker

## 2023-09-24 DIAGNOSIS — F3181 Bipolar II disorder: Secondary | ICD-10-CM | POA: Diagnosis not present

## 2023-09-24 DIAGNOSIS — F411 Generalized anxiety disorder: Secondary | ICD-10-CM

## 2023-09-24 DIAGNOSIS — F331 Major depressive disorder, recurrent, moderate: Secondary | ICD-10-CM

## 2023-09-24 DIAGNOSIS — F4310 Post-traumatic stress disorder, unspecified: Secondary | ICD-10-CM | POA: Diagnosis not present

## 2023-09-24 NOTE — Progress Notes (Signed)
THERAPIST PROGRESS NOTE   Session Date: 09/24/2023  Session Time: 1610-9604 Virtual Visit via Video Note  I connected with Joan [name] on 09/24/23 at  4:00 PM EST by a video enabled telemedicine application and verified that I am speaking with the correct person using two identifiers.  Location: Patient: Home Provider: Home office   I discussed the limitations of evaluation and management by telemedicine and the availability of in person appointments. The patient expressed understanding and agreed to proceed.  I discussed the assessment and treatment plan with the patient. The patient was provided an opportunity to ask questions and all were answered. The patient agreed with the plan and demonstrated an understanding of the instructions.   The patient was advised to call back or seek an in-person evaluation if the symptoms worsen or if the condition fails to improve as anticipated.  I provided 55 minutes of non-face-to-face time during this encounter.  Participation Level: Active  Behavioral Response: Casual, Neat, and Well GroomedAlertEuthymic  Type of Therapy: Individual Therapy  Treatment Goals addressed:  - LTG: Reduce frequency, intensity, and duration of depression symptoms so that daily functioning is improved (OP Depression) (MET)  - LTG: Increase coping skills to manage depression and improve ability to perform daily activities (OP Depression) (MET)  - STG: Joan [name] will identify cognitive patterns and beliefs that support depression (OP Depression) - STG: Joan [name] will reduce frequency of avoidant behaviors by 50% as evidenced by self-report in therapy sessions (Anxiety) (MET)  - LTG: "Work on changing thought process by challenging automatic negative thoughts and  anxious thoughts in order to improve outlook and perspective" (OP Depression) - LTG: Joan [name] will score less than 5 on the Generalized Anxiety Disorder 7 Scale (GAD-7) (Anxiety) - STG: Report a decrease in anxiety  symptoms as evidenced by an overall reduction in anxiety score by a minimum of 25% on the Generalized Anxiety Disorder Scale (GAD-7) (Anxiety)   ProgressTowards Goals: Progressing  Interventions: CBT, Motivational Interviewing, and Supportive  Summary: Joan [name] is a 43 y.o. female with past psych history of MDD, GAD, Bipolar 2, presenting for follow-up therapy session in efforts to improve management of depressive and anxious symptoms. Patient actively engaged in session, presenting in pleasant moods and congruent affect throughout visit.   - Pt openly participated in re-assessing depressive and anxious sxs via PHQ-9 and GAD-7, reviewing current scores, processing continued downward trends throughout the past 3 months, and variances in observed sxs. - Further explored pt's overall understanding of anxiety, and abilities to manage and benefits of small amounts in supporting and keeping one safe.  - Actively engaged in 3 mo. review of tx goals, determining 4/8 goals having been met, identifying "Had to realize it's okay to feel what I feel, and don't have to stay there, can face it and move on", sharing of having identified more coping skills and functioning better, and having reduced avoidant behaviors and having begun journaling and talking about things instead of internalizing as well as going to places alone more often. Pt determined 4/8 remaining goals to be progressing, expressing need for continued work on cognitive patterns and thought processes, and GAD-7 reflective of continued progress towards reduction in anxious sxs. - Introduced to [employer] Anxious Thoughts" activity,   Patient continues to meet criteria for MDD, GAD, Bipolar 2. Patient will continue to benefit from engagement in outpatient therapy due to being the least restrictive service to meet presenting needs.      09/24/2023    4:09 PM 09/10/2023  4:14 PM 07/29/2023    8:13 AM 06/23/2023    4:13 PM 06/09/2023    3:58 PM   Depression screen PHQ 2/9  Decreased Interest 0 0 0 0 2  Down, Depressed, Hopeless 0 1 1 2 3   PHQ - 2 Score 0 1 1 2 5   Altered sleeping 0 0 0 3 3  Tired, decreased energy 0 0 1 3 2   Change in appetite 0 0 0  2  Feeling bad or failure about yourself  0 1 1 0 2  Trouble concentrating 0 0 0 1 2  Moving slowly or fidgety/restless 0 0 0 0 1  Suicidal thoughts 0 0 0 0 2  PHQ-9 Score 0 2 3 9 19   Difficult doing work/chores  Not difficult at all Not difficult at all Somewhat difficult Somewhat difficult      09/24/2023    4:07 PM 09/10/2023    4:12 PM 07/29/2023    8:06 AM 06/23/2023    4:08 PM  GAD 7 : Generalized Anxiety Score  Nervous, Anxious, on Edge 0 1 1 3   Control/stop worrying 0 0 1 1  Worry too much - different things 0 1 3 3   Trouble relaxing 0 0 0 3  Restless 0 0 0 0  Easily annoyed or irritable 1 1 1 2   Afraid - awful might happen 0 0 1 2  Total GAD 7 Score 1 3 7 14   Anxiety Difficulty Not difficult at all Not difficult at all Not difficult at all Not difficult at all    Suicidal/Homicidal: No  Therapist Response: Clinician utilized CBT, MI, and supportive reflection interventions to support pt in navigating depressive and anxious sxs. Actively engaged pt in check-in, assessing presenting moods and affect.   - Re-assessed depressive and anxious sxs via PHQ-9 and GAD-7, engaging pt in further processing of current scores, previous scores, and noted trends. - Engaged pt in extensive review of individualized tx goals, actively evoking pt's thoughts, feelings, and perspectives surrounding progress throughout treatment thus far. - Introduced Academic librarian Anxious Thoughts" activity, in efforts to support pt in processing anxiety provoking situations, identifying potential outcomes, lasting impact of outcomes, and the comparing irrational and rational thoughts experienced surrounding anxiety provoking situations.  Clinician reassessed severity of sxs, and presence of any safety  concerns. Clinician provided support and empathy to patient during session.   Plan: Return again in 2 weeks.  Diagnosis:  Encounter Diagnoses  Name Primary?   Major depressive disorder, recurrent episode, moderate (HCC) Yes   Bipolar 2 disorder (HCC)    GAD (generalized anxiety disorder)    PTSD (post-traumatic stress disorder)     Collaboration of Care: Other None necessary at this time.  Patient/Guardian was advised Release of Information must be obtained prior to any record release in order to collaborate their care with an outside provider. Patient/Guardian was advised if they have not already done so to contact the registration department to sign all necessary forms in order for [place] to release information regarding their care.   Consent: Patient/Guardian gives verbal consent for treatment and assignment of benefits for services provided during this visit. Patient/Guardian expressed understanding and agreed to proceed.   Leisa Lenz, MSW, LCSW 09/24/2023,  4:10 PM

## 2023-10-03 ENCOUNTER — Ambulatory Visit (INDEPENDENT_AMBULATORY_CARE_PROVIDER_SITE_OTHER): Payer: MEDICAID | Admitting: Primary Care

## 2023-10-03 ENCOUNTER — Encounter: Payer: Self-pay | Admitting: Primary Care

## 2023-10-03 VITALS — BP 132/80 | HR 97 | Temp 98.3°F | Ht 66.0 in | Wt 298.0 lb

## 2023-10-03 DIAGNOSIS — J3089 Other allergic rhinitis: Secondary | ICD-10-CM | POA: Diagnosis not present

## 2023-10-03 MED ORDER — FLUTICASONE PROPIONATE 50 MCG/ACT NA SUSP: 1.0000 | Freq: Two times a day (BID) | NASAL | 0 refills | Status: DC

## 2023-10-03 MED ORDER — CETIRIZINE HCL 10 MG PO TABS: 10.0000 mg | ORAL_TABLET | Freq: Every day | ORAL | 0 refills | Status: DC

## 2023-10-03 NOTE — Patient Instructions (Signed)
 You will either be contacted via phone regarding your referral to the allergist, or you may receive a letter on your MyChart portal from our referral team with instructions for scheduling an appointment. Please let us know if you have not been contacted by anyone within two weeks.  Start using Flonase (fluticasone) nasal spray. Instill 1 spray in each nostril twice daily.   Start taking cetirizine (Zyrtec) 10 mg every evening for allergies.  It was a pleasure to see you today!
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
## 2023-10-03 NOTE — Assessment & Plan Note (Signed)
 Exam today without evidence of infection.  Start Flonase (fluticasone) nasal spray. Instill 1 spray in each nostril twice daily.  Start Zyrtec 10 mg at bedtime.  Referral placed to allergist per patient request.

## 2023-10-08 ENCOUNTER — Ambulatory Visit (INDEPENDENT_AMBULATORY_CARE_PROVIDER_SITE_OTHER): Payer: MEDICAID | Admitting: Licensed Clinical Social Worker

## 2023-10-08 ENCOUNTER — Ambulatory Visit: Payer: MEDICAID | Admitting: Sports Medicine

## 2023-10-08 DIAGNOSIS — Z91199 Patient's noncompliance with other medical treatment and regimen due to unspecified reason: Secondary | ICD-10-CM

## 2023-10-08 NOTE — Progress Notes (Signed)
 THERAPIST PROGRESS NOTE   Session Date: 10/08/2023  Session Time: 1600  Patient no-showed today's appointment; appointment was for follow up therapy, provider notified for review of record, patient agrees to reschedule missed appointment.   Leisa Lenz, MSW, LCSW 10/08/2023,  10:45 AM

## 2023-10-14 ENCOUNTER — Other Ambulatory Visit: Payer: Self-pay

## 2023-10-14 ENCOUNTER — Ambulatory Visit (INDEPENDENT_AMBULATORY_CARE_PROVIDER_SITE_OTHER): Payer: MEDICAID | Admitting: Physician Assistant

## 2023-10-14 ENCOUNTER — Encounter: Payer: Self-pay | Admitting: Physician Assistant

## 2023-10-14 DIAGNOSIS — M7662 Achilles tendinitis, left leg: Secondary | ICD-10-CM

## 2023-10-14 NOTE — Progress Notes (Signed)
 Office Visit Note   Patient: Joan [name]           Date of Birth: 11-04-1980           MRN: [id number] Visit Date: 10/14/2023              Requested by: Doreene Nest, NP [address] [place],  [place] [address] PCP: Doreene Nest, NP   Assessment & Plan: Visit Diagnoses: Achilles tendinosis left  Plan: Pleasant 43 year old woman comes in today with a chief complaint of left posterior heel pain.  She said she was stepping down and felt some pops in the back of her heel she does have a history of insertional Achilles tendinitis and plantar fasciitis.  Since then she has been acutely tender over her Achilles.  She did go to an urgent care and she was given a boot.  Unfortunately she has muscular The Boot Is Very Uncomfortable to Her.  We Will Put Her in a Short Leg Cast with Some Plantar Glide Fraction.  She Does Have Response with Thompson's Testing.  Would like to Take the Cast off in 2 Weeks and Reexamine Her.  I Have Been Very Clear with Her That If She Develops Any Calf Pain to Let Me Know.  She Cannot Take Aspirin or Ibuprofen.  Follow-Up Instructions: Return in about 2 weeks (around 10/28/2023).   Orders:  Orders Placed This Encounter  Procedures   XR Ankle 2 Views Left   No orders of the defined types were placed in this encounter.     Procedures: No procedures performed   Clinical Data: No additional findings.   Subjective: Chief Complaint  Patient presents with   Left Foot - Pain    HPI pleasant 43 year old woman who was stepping down has previous history of insertional Achilles tendinitis and felt 2 pops over the weekend became extremely painful over the Achilles.  She did go to an urgent care and was given a boot she is not wearing it today because it does not fit properly  Review of Systems  All other systems reviewed and are negative.    Objective: Vital Signs: LMP 07/24/2018 (Exact Date) Comment: surgery 07/31/2018  Physical  Exam Constitutional:      Appearance: Normal appearance.  Pulmonary:     Effort: Pulmonary effort is normal.  Skin:    General: Skin is warm and dry.  Neurological:     General: No focal deficit present.     Mental Status: She is alert and oriented to person, place, and time.  Psychiatric:        Mood and Affect: Mood normal.        Behavior: Behavior normal.     Ortho Exam Examination she has a palpable Achilles though very tender.  Compartments of the leg are soft and nontender she has good response with Thompson's testing.  She has pain with plantarflexion dorsiflexion.  No evidence of cellulitis.  Mild soft tissue swelling Specialty Comments:  No specialty comments available.  Imaging: No results found.   PMFS History: Patient Active Problem List   Diagnosis Date Noted   Environmental and seasonal allergies 10/03/2023   Tendonitis, Achilles, left 09/16/2023   Chronic heel pain, left 09/12/2023   Dysuria 09/12/2023   Major depressive disorder, recurrent episode, moderate (HCC) 06/09/2023   Vertigo 05/29/2023   Bipolar 2 disorder, major depressive episode (HCC) 10/23/2022   Migraines 01/26/2021   GAD (generalized anxiety disorder) 10/24/2020   Asthma  11/03/2018   Recurrent sinusitis 05/20/2018   Tachycardia 05/04/2018   Uncontrolled type 2 diabetes mellitus with hyperglycemia (HCC) 11/24/2017   Essential hypertension 11/24/2017   Hyperlipidemia 11/24/2017   Major depressive disorder, recurrent, severe without psychotic features (HCC)    PTSD (post-traumatic stress disorder) 04/11/2015   Past Medical History:  Diagnosis Date   Acute asthma exacerbation 04/10/2018   Acute bacterial conjunctivitis of right eye 05/01/2023   Acute non-recurrent maxillary sinusitis 09/25/2018   Asthma    Asthma exacerbation 08/03/2020   Auditory hallucinations    only after anesthesia   BV (bacterial vaginosis) 02/13/2018   COVID-19 virus infection 08/16/2019   Diabetes mellitus  without complication (HCC)    diet controlled   Diabetes mellitus, type II (HCC)    insulin, jardiance   Dyspnea    with exertion   Essential hypertension    Essential hypertension 11/24/2017   GERD (gastroesophageal reflux disease)    occasionally-NO MEDS   History of placement of ear tubes 05/20/2018   Hyperlipidemia    Kidney stone 02/11/2019   Localized skin mass, lump, or swelling 01/26/2021   MDD (major depressive disorder)    Miscarriage    Nausea & vomiting 10/05/2019   PTSD (post-traumatic stress disorder)    Right lower quadrant abdominal pain 02/13/2018   Tachycardia     Family History  Problem Relation Age of Onset   Asthma Mother    Diabetes Mother    Hyperlipidemia Mother    Hypertension Mother    Diabetes Father    Hyperlipidemia Father    Hypertension Father    Diabetes Brother    Depression Brother    Alcohol abuse Brother    Depression Maternal Grandmother    Stroke Paternal Grandmother    Breast cancer Neg Hx     Past Surgical History:  Procedure Laterality Date   ABDOMINAL HYSTERECTOMY     ADENOIDECTOMY     CHOLECYSTECTOMY  2009   CYSTOSCOPY N/A 07/31/2018   Procedure: CYSTOSCOPY;  Surgeon: Christeen Douglas, MD;  Location: [facility];  Service: Gynecology;  Laterality: N/A;   LAPAROSCOPIC BILATERAL SALPINGECTOMY Bilateral 07/31/2018   Procedure: LAPAROSCOPIC BILATERAL SALPINGECTOMY;  Surgeon: Christeen Douglas, MD;  Location: [facility];  Service: Gynecology;  Laterality: Bilateral;   LAPAROSCOPIC HYSTERECTOMY N/A 07/31/2018   Procedure: HYSTERECTOMY TOTAL LAPAROSCOPIC;  Surgeon: Christeen Douglas, MD;  Location: [facility];  Service: Gynecology;  Laterality: N/A;   LAPAROSCOPY N/A 06/07/2019   Procedure: LAPAROSCOPY OPERATIVE, WITH PERITONEAL BIOPSIES;  Surgeon: Christeen Douglas, MD;  Location: [facility];  Service: Gynecology;  Laterality: N/A;   LYSIS OF ADHESION N/A 07/31/2018   Procedure: LYSIS OF ADHESION;  Surgeon: Christeen Douglas, MD;  Location:  [facility];  Service: Gynecology;  Laterality: N/A;   OVARY SURGERY Right    cyst removed a while ago   TONSILLECTOMY     tubes in ear     Social History   Occupational History   Occupation: day care worker  Tobacco Use   Smoking status: Never   Smokeless tobacco: Never  Vaping Use   Vaping status: Never Used  Substance and Sexual Activity   Alcohol use: No   Drug use: No   Sexual activity: Yes    Birth control/protection: Surgical

## 2023-10-20 ENCOUNTER — Ambulatory Visit: Payer: MEDICAID | Admitting: Sports Medicine

## 2023-10-20 ENCOUNTER — Telehealth: Payer: Self-pay

## 2023-10-20 ENCOUNTER — Ambulatory Visit (INDEPENDENT_AMBULATORY_CARE_PROVIDER_SITE_OTHER): Payer: MEDICAID

## 2023-10-20 NOTE — Progress Notes (Signed)
 Patient came in. Cast removed. Spoke with [place]. Patient was given 2 heel lifts and instructed on placing them on top of each other in the back of her tall CAM boot, to keep her heel elevated. She will keep follow up appointment as scheduled with [place].

## 2023-10-20 NOTE — Telephone Encounter (Signed)
Ok to schedule NV?

## 2023-10-20 NOTE — Telephone Encounter (Signed)
 Copied from CRM [id number]. Topic: Clinical - Request for Lab/Test Order >> Oct 17, 2023  4:55 PM Joan [name] wrote: Reason for CRM: pt called and requested to complete a TB skin test. Please call and advise when ok to schedule.

## 2023-10-20 NOTE — Telephone Encounter (Signed)
 Yes, okay to do nurse visit. She has an appointment with me in 1 week if she wants to wait until then.

## 2023-10-21 ENCOUNTER — Ambulatory Visit: Payer: MEDICAID

## 2023-10-21 NOTE — Telephone Encounter (Signed)
 Attempted to reach patient, unable to leave VM, mailbox full.  Per chart review patient scheduled NV on 03/19 for TB test.

## 2023-10-22 ENCOUNTER — Ambulatory Visit (INDEPENDENT_AMBULATORY_CARE_PROVIDER_SITE_OTHER): Payer: MEDICAID

## 2023-10-22 ENCOUNTER — Ambulatory Visit (INDEPENDENT_AMBULATORY_CARE_PROVIDER_SITE_OTHER): Payer: MEDICAID | Admitting: Licensed Clinical Social Worker

## 2023-10-22 DIAGNOSIS — F411 Generalized anxiety disorder: Secondary | ICD-10-CM

## 2023-10-22 DIAGNOSIS — Z111 Encounter for screening for respiratory tuberculosis: Secondary | ICD-10-CM

## 2023-10-22 DIAGNOSIS — F3181 Bipolar II disorder: Secondary | ICD-10-CM | POA: Diagnosis not present

## 2023-10-22 DIAGNOSIS — F331 Major depressive disorder, recurrent, moderate: Secondary | ICD-10-CM

## 2023-10-22 NOTE — Progress Notes (Unsigned)
 THERAPIST PROGRESS NOTE   Session Date: 10/22/2023  Session Time: 1610-9604  Participation Level: Active  Behavioral Response: CasualAlertEuthymic  Type of Therapy: Individual Therapy  Treatment Goals addressed:  - STG: Francy will identify cognitive patterns and beliefs that support depression (OP Depression) - LTG: "Work on changing thought process by challenging automatic negative thoughts and  anxious thoughts in order to improve outlook and perspective" (OP Depression) - LTG: Rennie will score less than 5 on the Generalized Anxiety Disorder 7 Scale (GAD-7) (Anxiety) - STG: Report a decrease in anxiety symptoms as evidenced by an overall reduction in anxiety score by a minimum of 25% on the Generalized Anxiety Disorder Scale (GAD-7) (Anxiety)   ProgressTowards Goals: Progressing  Interventions: CBT, Motivational Interviewing, and Supportive  Summary: Joan [name] is a 43 y.o. female with past psych history of MDD, GAD, Bipolar 2, presenting for follow-up therapy session in efforts to improve management of depressive and anxious symptoms. Patient actively engaged in session, presenting in pleasant moods and congruent affect throughout visit.   - ***  worsened sleep in recent weeks, will go to [place] 7-8p, won't fall asleep until around 11, awake again around 130 up for 3-4 hrs. Started new job, un-structured and un-happy and transitioning to new job next week with alternate company. - reassess depressive and anxious sxs, exploring recent challenges with observed sxs and increased depressive sxs. - recent panic attack when traveling to [place] via train, unable to determine what triggered, believe may be due to facing rearwards and creating discomfort. - Explored hx of hasn't journaled in 2 months, difficulties sleeping, racing, random thoughts when attempting to lay down and prepare for sleep,  - Expressed understanding and interest in beginning to work thru extensive trauma due to continuing to  notice presence in all aspects of life to include marriage, other interpersonal relationships, going certain places, seeing certain things that remind pt of ex-husband, etc. ***    Patient continues to meet criteria for MDD, GAD, Bipolar 2. Patient will continue to benefit from engagement in outpatient therapy due to being the least restrictive service to meet presenting needs.      10/22/2023    4:10 PM 09/24/2023    4:09 PM 09/10/2023    4:14 PM 07/29/2023    8:13 AM 06/23/2023    4:13 PM  Depression screen PHQ 2/9  Decreased Interest 1 0 0 0 0  Down, Depressed, Hopeless 1 0 1 1 2   PHQ - 2 Score 2 0 1 1 2   Altered sleeping 1 0 0 0 3  Tired, decreased energy 0 0 0 1 3  Change in appetite 1 0 0 0   Feeling bad or failure about yourself  1 0 1 1 0  Trouble concentrating 0 0 0 0 1  Moving slowly or fidgety/restless 0 0 0 0 0  Suicidal thoughts 0 0 0 0 0  PHQ-9 Score 5 0 2 3 9   Difficult doing work/chores Not difficult at all  Not difficult at all Not difficult at all Somewhat difficult      10/22/2023    4:07 PM 09/24/2023    4:07 PM 09/10/2023    4:12 PM 07/29/2023    8:06 AM  GAD 7 : Generalized Anxiety Score  Nervous, Anxious, on Edge 1 0 1 1  Control/stop worrying 0 0 0 1  Worry too much - different things 1 0 1 3  Trouble relaxing 0 0 0 0  Restless 0 0 0 0  Easily annoyed  or irritable 1 1 1 1   Afraid - awful might happen 0 0 0 1  Total GAD 7 Score 3 1 3 7   Anxiety Difficulty Not difficult at all Not difficult at all Not difficult at all Not difficult at all    Suicidal/Homicidal: No  Therapist Response: Clinician utilized CBT, MI, and supportive reflection interventions to support pt in navigating depressive and anxious sxs. Actively engaged pt in check-in, assessing presenting moods and affect.   - ***   Re-assessed depressive and anxious sxs via PHQ-9 and GAD-7, engaging pt in further processing of current scores, previous scores, and noted trends. - Engaged pt in  extensive review of individualized tx goals, actively evoking pt's thoughts, feelings, and perspectives surrounding progress throughout treatment thus far. - Introduced Academic librarian Anxious Thoughts" activity, in efforts to support pt in processing anxiety provoking situations, identifying potential outcomes, lasting impact of outcomes, and the comparing irrational and rational thoughts experienced surrounding anxiety provoking situations.  Clinician reassessed severity of sxs, and presence of any safety concerns. Clinician provided support and empathy to patient during session.   Plan: Return again in 2-3 weeks.  Diagnosis:  Encounter Diagnoses  Name Primary?   GAD (generalized anxiety disorder)    Major depressive disorder, recurrent episode, moderate (HCC) Yes   Bipolar 2 disorder (HCC)      Collaboration of Care: Other None necessary at this time.  Patient/Guardian was advised Release of Information must be obtained prior to any record release in order to collaborate their care with an outside provider. Patient/Guardian was advised if they have not already done so to contact the registration department to sign all necessary forms in order for [place] to release information regarding their care.   Consent: Patient/Guardian gives verbal consent for treatment and assignment of benefits for services provided during this visit. Patient/Guardian expressed understanding and agreed to proceed.   Leisa Lenz, MSW, LCSW 10/22/2023,  4:12 PM

## 2023-10-22 NOTE — Progress Notes (Signed)
 PPD Placement note Joan [name], 43 y.o. female is here today for placement of PPD test Reason for PPD test: work Pt taken PPD test before: yes Verified in allergy area and with patient that they are not allergic to the products PPD is made of (Phenol or Tween). No:  Is patient taking any oral or IV steroid medication now or have they taken it in the last month? no Has the patient ever received the BCG vaccine?: no Has the patient been in recent contact with anyone known or suspected of having active TB disease?: no      Date of exposure (if applicable):       Name of person they were exposed to (if applicable):  Patient's Country of origin?:  O: Alert and oriented in NAD. P:  PPD placed on 10/22/2023.  Patient advised to return for reading within 48-72 hours.

## 2023-10-23 ENCOUNTER — Encounter (HOSPITAL_COMMUNITY): Payer: Self-pay

## 2023-10-24 ENCOUNTER — Ambulatory Visit: Payer: MEDICAID

## 2023-10-24 LAB — TB SKIN TEST
Induration: 0 mm
TB Skin Test: NEGATIVE

## 2023-10-24 NOTE — Progress Notes (Signed)
 PPD Reading Note  PPD read and results entered in EpicCare.  Result: 0 mm induration.  Interpretation: Negative  If test not read within 48-72 hours of initial placement, patient advised to repeat in other arm 1-3 weeks after this test.  Allergic reaction: no

## 2023-10-28 ENCOUNTER — Ambulatory Visit (INDEPENDENT_AMBULATORY_CARE_PROVIDER_SITE_OTHER): Payer: MEDICAID | Admitting: Physician Assistant

## 2023-10-28 ENCOUNTER — Encounter: Payer: Self-pay | Admitting: Physician Assistant

## 2023-10-28 DIAGNOSIS — G8929 Other chronic pain: Secondary | ICD-10-CM

## 2023-10-28 DIAGNOSIS — M79672 Pain in left foot: Secondary | ICD-10-CM

## 2023-10-28 NOTE — Progress Notes (Signed)
 Office Visit Note   Patient: Joan [name]           Date of Birth: [birth year]           MRN: [id number] Visit Date: 10/28/2023              Requested by: Doreene Nest, NP [address] [place],  [place] [address] PCP: Doreene Nest, NP  No chief complaint on file.     HPI: Patient presents today for follow-up on her left Achilles.  She did have an injury when she stepped down and felt some pops.  We did try immobilizing her in a cast but she cannot tolerate it.  She has been immobilized in a boot with 2 heel wedges.  She is feeling a lot better.  She actually went out and invested in a pair of [employer] sneakers and all of her symptoms have all but resolved.  Assessment & Plan: Visit Diagnoses: Left insertional Achilles tendinitis  Plan: I again emphasized the importance of stretching well.  Taking things easy.  She like to get back to running it sometime in the future and is gena work up to that she may follow-up with me as needed  Follow-Up Instructions: No follow-ups on file.   Ortho Exam  Patient is alert, oriented, no adenopathy, well-dressed, normal affect, normal respiratory effort. Examination she has no tenderness Achilles is intact she has good strong dorsiflexion plantarflexion little tenderness over the plantar surface of the heel no redness no erythema compartments are soft  Imaging: No results found. No images are attached to the encounter.  Labs: Lab Results  Component Value Date   HGBA1C 7.5 (A) 07/31/2023   HGBA1C 14.9 (A) 05/01/2023   HGBA1C 12.6 (H) 10/22/2022   ESRSEDRATE 31 (H) 09/28/2019   CRP 3.3 (H) 09/28/2019   REPTSTATUS 02/04/2020 FINAL 02/02/2020   GRAMSTAIN  05/10/2018    NO ORGANISMS SEEN RARE WBC SEEN RARE RED BLOOD CELLS GRAM STAIN REVIEWED-AGREE WITH RESULT    GRAMSTAIN  05/10/2018    DUPLICATE REQUEST SEE ACC [id number], GRAM STAIN IN BODY FLUID CULTURE WORK UP Performed at [facility], [address]  [place], [place] [address]    CULT  02/02/2020    NO GROUP A STREP (S.PYOGENES) ISOLATED Performed at [facility], [address] [place], [place] [address]      Lab Results  Component Value Date   ALBUMIN 4.1 10/22/2022   ALBUMIN 4.2 10/03/2020   ALBUMIN 3.9 08/04/2020    Lab Results  Component Value Date   MG 2.0 10/22/2022   MG 1.9 08/03/2020   No results found for: "VD25OH"  No results found for: "PREALBUMIN"    Latest Ref Rng & Units 10/22/2022    8:16 PM 10/22/2022    2:00 PM 01/12/2021    3:00 PM  CBC EXTENDED  WBC 4.0 - 10.5 K/uL 8.2  7.0  7.5   RBC 3.87 - 5.11 MIL/uL 5.29  5.77  5.09   Hemoglobin 12.0 - 15.0 g/dL 78.4  69.6  29.5   HCT 36.0 - 46.0 % 43.5  47.5  43.6   Platelets 150 - 400 K/uL 309  341  272   NEUT# 1.7 - 7.7 K/uL 5.3  4.6  4,950   Lymph# 0.7 - 4.0 K/uL 2.3  2.0  1,943      There is no height or weight on file to calculate BMI.  Orders:  No orders of  the defined types were placed in this encounter.  No orders of the defined types were placed in this encounter.    Procedures: No procedures performed  Clinical Data: No additional findings.  ROS:  All other systems negative, except as noted in the HPI. Review of Systems  Objective: Vital Signs: LMP 07/24/2018 (Exact Date) Comment: surgery 07/31/2018  Specialty Comments:  No specialty comments available.  PMFS History: Patient Active Problem List   Diagnosis Date Noted   Environmental and seasonal allergies 10/03/2023   Achilles tendinitis, left leg 09/16/2023   Chronic heel pain, left 09/12/2023   Dysuria 09/12/2023   Major depressive disorder, recurrent episode, moderate (HCC) 06/09/2023   Vertigo 05/29/2023   Bipolar 2 disorder, major depressive episode (HCC) 10/23/2022   Migraines 01/26/2021   GAD (generalized anxiety disorder) 10/24/2020   Asthma 11/03/2018   Recurrent sinusitis 05/20/2018   Tachycardia 05/04/2018   Uncontrolled type 2 diabetes mellitus with  hyperglycemia (HCC) 11/24/2017   Essential hypertension 11/24/2017   Hyperlipidemia 11/24/2017   Major depressive disorder, recurrent, severe without psychotic features (HCC)    PTSD (post-traumatic stress disorder) 04/11/2015   Past Medical History:  Diagnosis Date   Acute asthma exacerbation 04/10/2018   Acute bacterial conjunctivitis of right eye 05/01/2023   Acute non-recurrent maxillary sinusitis 09/25/2018   Asthma    Asthma exacerbation 08/03/2020   Auditory hallucinations    only after anesthesia   BV (bacterial vaginosis) 02/13/2018   COVID-19 virus infection 08/16/2019   Diabetes mellitus without complication (HCC)    diet controlled   Diabetes mellitus, type II (HCC)    insulin, jardiance   Dyspnea    with exertion   Essential hypertension    Essential hypertension 11/24/2017   GERD (gastroesophageal reflux disease)    occasionally-NO MEDS   History of placement of ear tubes 05/20/2018   Hyperlipidemia    Kidney stone 02/11/2019   Localized skin mass, lump, or swelling 01/26/2021   MDD (major depressive disorder)    Miscarriage    Nausea & vomiting 10/05/2019   PTSD (post-traumatic stress disorder)    Right lower quadrant abdominal pain 02/13/2018   Tachycardia     Family History  Problem Relation Age of Onset   Asthma Mother    Diabetes Mother    Hyperlipidemia Mother    Hypertension Mother    Diabetes Father    Hyperlipidemia Father    Hypertension Father    Diabetes Brother    Depression Brother    Alcohol abuse Brother    Depression Maternal Grandmother    Stroke Paternal Grandmother    Breast cancer Neg Hx     Past Surgical History:  Procedure Laterality Date   ABDOMINAL HYSTERECTOMY     ADENOIDECTOMY     CHOLECYSTECTOMY  2009   CYSTOSCOPY N/A 07/31/2018   Procedure: CYSTOSCOPY;  Surgeon: Christeen Douglas, MD;  Location: [facility];  Service: Gynecology;  Laterality: N/A;   LAPAROSCOPIC BILATERAL SALPINGECTOMY Bilateral 07/31/2018    Procedure: LAPAROSCOPIC BILATERAL SALPINGECTOMY;  Surgeon: Christeen Douglas, MD;  Location: [facility];  Service: Gynecology;  Laterality: Bilateral;   LAPAROSCOPIC HYSTERECTOMY N/A 07/31/2018   Procedure: HYSTERECTOMY TOTAL LAPAROSCOPIC;  Surgeon: Christeen Douglas, MD;  Location: [facility];  Service: Gynecology;  Laterality: N/A;   LAPAROSCOPY N/A 06/07/2019   Procedure: LAPAROSCOPY OPERATIVE, WITH PERITONEAL BIOPSIES;  Surgeon: Christeen Douglas, MD;  Location: [facility];  Service: Gynecology;  Laterality: N/A;   LYSIS OF ADHESION N/A 07/31/2018   Procedure: LYSIS OF ADHESION;  Surgeon:  Christeen Douglas, MD;  Location: [facility];  Service: Gynecology;  Laterality: N/A;   OVARY SURGERY Right    cyst removed a while ago   TONSILLECTOMY     tubes in ear     Social History   Occupational History   Occupation: day care worker  Tobacco Use   Smoking status: Never   Smokeless tobacco: Never  Vaping Use   Vaping status: Never Used  Substance and Sexual Activity   Alcohol use: No   Drug use: No   Sexual activity: Yes    Birth control/protection: Surgical

## 2023-10-29 ENCOUNTER — Ambulatory Visit (INDEPENDENT_AMBULATORY_CARE_PROVIDER_SITE_OTHER): Payer: MEDICAID | Admitting: Primary Care

## 2023-10-29 ENCOUNTER — Encounter: Payer: Self-pay | Admitting: Primary Care

## 2023-10-29 VITALS — BP 124/70 | HR 74 | Temp 96.7°F | Ht 66.0 in | Wt 300.0 lb

## 2023-10-29 DIAGNOSIS — E1165 Type 2 diabetes mellitus with hyperglycemia: Secondary | ICD-10-CM

## 2023-10-29 DIAGNOSIS — E785 Hyperlipidemia, unspecified: Secondary | ICD-10-CM

## 2023-10-29 DIAGNOSIS — Z7985 Long-term (current) use of injectable non-insulin antidiabetic drugs: Secondary | ICD-10-CM

## 2023-10-29 DIAGNOSIS — Z0000 Encounter for general adult medical examination without abnormal findings: Secondary | ICD-10-CM | POA: Insufficient documentation

## 2023-10-29 DIAGNOSIS — Z1231 Encounter for screening mammogram for malignant neoplasm of breast: Secondary | ICD-10-CM

## 2023-10-29 DIAGNOSIS — F332 Major depressive disorder, recurrent severe without psychotic features: Secondary | ICD-10-CM

## 2023-10-29 DIAGNOSIS — J4520 Mild intermittent asthma, uncomplicated: Secondary | ICD-10-CM

## 2023-10-29 DIAGNOSIS — I10 Essential (primary) hypertension: Secondary | ICD-10-CM

## 2023-10-29 DIAGNOSIS — F411 Generalized anxiety disorder: Secondary | ICD-10-CM

## 2023-10-29 DIAGNOSIS — G43009 Migraine without aura, not intractable, without status migrainosus: Secondary | ICD-10-CM

## 2023-10-29 DIAGNOSIS — F3181 Bipolar II disorder: Secondary | ICD-10-CM

## 2023-10-29 LAB — HEMOGLOBIN A1C: Hgb A1c MFr Bld: 7.4 % — ABNORMAL HIGH (ref 4.6–6.5)

## 2023-10-29 LAB — LIPID PANEL
Cholesterol: 168 mg/dL (ref 0–200)
HDL: 51.5 mg/dL
LDL Cholesterol: 85 mg/dL (ref 0–99)
NonHDL: 116.46
Total CHOL/HDL Ratio: 3
Triglycerides: 158 mg/dL — ABNORMAL HIGH (ref 0.0–149.0)
VLDL: 31.6 mg/dL (ref 0.0–40.0)

## 2023-10-29 LAB — COMPREHENSIVE METABOLIC PANEL
ALT: 17 U/L (ref 0–35)
AST: 12 U/L (ref 0–37)
Albumin: 4.1 g/dL (ref 3.5–5.2)
Alkaline Phosphatase: 112 U/L (ref 39–117)
BUN: 13 mg/dL (ref 6–23)
CO2: 29 meq/L (ref 19–32)
Calcium: 8.9 mg/dL (ref 8.4–10.5)
Chloride: 99 meq/L (ref 96–112)
Creatinine, Ser: 0.59 mg/dL (ref 0.40–1.20)
GFR: 109.82 mL/min (ref >60.00)
Glucose, Bld: 144 mg/dL — ABNORMAL HIGH (ref 70–99)
Potassium: 4.5 meq/L (ref 3.5–5.1)
Sodium: 135 meq/L (ref 135–145)
Total Bilirubin: 0.3 mg/dL (ref 0.2–1.2)
Total Protein: 6.6 g/dL (ref 6.0–8.3)

## 2023-10-29 LAB — MICROALBUMIN / CREATININE URINE RATIO
Creatinine,U: 30.7 mg/dL
Microalb Creat Ratio: UNDETERMINED mg/g (ref 0.0–30.0)
Microalb, Ur: <0.7 mg/dL

## 2023-10-29 NOTE — Assessment & Plan Note (Signed)
 Repeat A1C pending. Glucose readings seem improved.  Continue Lantus 50 units in AM and 10-15 units HS. Continue Ozempic 2 mg weekly. Consider switching to [facility] given lack of help with food cravings.  Await A1C results.  Urine microalbumin pending.   Follow up in 3-6 months.

## 2023-10-29 NOTE — Assessment & Plan Note (Signed)
Immunizations UTD. Mammogram due, orders placed.  Discussed the importance of a healthy diet and regular exercise in order for weight loss, and to reduce the risk of further co-morbidity.  Exam stable. Labs pending.  Follow up in 1 year for repeat physical.

## 2023-10-29 NOTE — Assessment & Plan Note (Signed)
 Doing well! Following with psychiatry and therapy.  Continue Zoloft 50 mg daily, mirtazapine 7.5 mg HS, gabapentin 100 mg BID.

## 2023-10-29 NOTE — Assessment & Plan Note (Signed)
Repeat lipid panel pending.  Discussed the importance of a healthy diet and regular exercise in order for weight loss, and to reduce the risk of further co-morbidity. Continue atorvastatin 20 mg daily.

## 2023-10-29 NOTE — Assessment & Plan Note (Signed)
 Controlled.  Continue valsartan 80 mg daily. CMP pending.
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
## 2023-10-29 NOTE — Assessment & Plan Note (Signed)
 Improved since better glucose control.  Remain off propranolol ER 80 mg for now. She will update if she resumes. Continue Imitrex 50 mg PRN. Continue Tylenol PRN for which she uses sparingly.

## 2023-10-29 NOTE — Assessment & Plan Note (Signed)
 Controlled.   Continue albuterol inhaler PRN for which she uses sparingly. Continue Claritin 10 mg daily. Continue Flonase daily.

## 2023-10-30 ENCOUNTER — Telehealth: Payer: Self-pay

## 2023-10-30 MED ORDER — VALSARTAN 80 MG PO TABS: 80.0000 mg | ORAL_TABLET | Freq: Every day | ORAL | 3 refills | Status: AC

## 2023-10-30 MED ORDER — TIRZEPATIDE 2.5 MG/0.5ML ~~LOC~~ SOAJ: 2.5000 mg | SUBCUTANEOUS | 0 refills | Status: DC

## 2023-10-30 MED ORDER — INSULIN GLARGINE 100 UNIT/ML SOLOSTAR PEN: PEN_INJECTOR | SUBCUTANEOUS | 1 refills | Status: DC

## 2023-10-30 MED ORDER — ATORVASTATIN CALCIUM 20 MG PO TABS: 20.0000 mg | ORAL_TABLET | Freq: Every day | ORAL | 3 refills | Status: AC

## 2023-10-30 NOTE — Telephone Encounter (Signed)
 Harvin Hazel, her Greggory Keen may require prior authorization.  It is important that we put that Ozempic was ineffective within the prior authorization.

## 2023-10-30 NOTE — Telephone Encounter (Signed)
 Pharmacy Patient Advocate Encounter   Received notification from CoverMyMeds that prior authorization for Mounjaro 2.5 mg/0.5 ml pen is required/requested.   Insurance verification completed.   The patient is insured through [facility] .   Per test claim: PA required; PA started via CoverMyMeds. KEY [id number] . Waiting for clinical questions to populate.

## 2023-10-31 ENCOUNTER — Other Ambulatory Visit (HOSPITAL_COMMUNITY): Payer: Self-pay

## 2023-11-03 ENCOUNTER — Other Ambulatory Visit (HOSPITAL_COMMUNITY): Payer: Self-pay

## 2023-11-03 ENCOUNTER — Telehealth: Payer: Self-pay

## 2023-11-03 NOTE — Telephone Encounter (Signed)
 PA expired  PA request has been Started. New Encounter has been or will be created for follow up. For additional info see Pharmacy Prior Auth telephone encounter from 11/03/23.

## 2023-11-03 NOTE — Telephone Encounter (Signed)
 Pharmacy Patient Advocate Encounter   Received notification from Patient Advice Request messages that prior authorization for Mounjaro 2.5MG /0.5ML auto-injectors is required/requested.   Insurance verification completed.   The patient is insured through [facility] .   Per test claim: PA required; PA started via CoverMyMeds. KEY [id number] . Waiting for clinical questions to populate.

## 2023-11-03 NOTE — Telephone Encounter (Signed)
 [facility], can you find out what's going on with her Greggory Keen Rx?

## 2023-11-04 ENCOUNTER — Other Ambulatory Visit (HOSPITAL_COMMUNITY): Payer: Self-pay

## 2023-11-04 ENCOUNTER — Telehealth: Payer: Self-pay | Admitting: Primary Care

## 2023-11-04 DIAGNOSIS — E1165 Type 2 diabetes mellitus with hyperglycemia: Secondary | ICD-10-CM

## 2023-11-04 MED ORDER — SEMAGLUTIDE (2 MG/DOSE) 8 MG/3ML ~~LOC~~ SOPN: 2.0000 mg | PEN_INJECTOR | SUBCUTANEOUS | 0 refills | Status: DC

## 2023-11-04 NOTE — Telephone Encounter (Signed)
 Clinical questions have been answered and PA submitted. PA currently Pending.

## 2023-11-04 NOTE — Telephone Encounter (Signed)
 Last Fill: 07/31/23-not on current med list  Last OV: 10/29/23 Next OV: None Scheduled  Routing to provider for review/authorization.     Copied from CRM [id number]. Topic: Clinical - Medication Refill >> Nov 04, 2023  1:09 PM Marica Otter wrote: Most Recent Primary Care Visit:  Provider: Doreene Nest  Department: [facility]  Visit Type: PHYSICAL  Date: 10/29/2023  Medication: Semaglutide, 2 MG/DOSE, 8 MG/3ML SOPN  Has the patient contacted their pharmacy? No, states Greggory Keen was denied wants to go back to [employer] (Agent: If no, request that the patient contact the pharmacy for the refill. If patient does not wish to contact the pharmacy document the reason why and proceed with request.) (Agent: If yes, when and what did the pharmacy advise?)  Is this the correct pharmacy for this prescription? Yes If no, delete pharmacy and type the correct one.  This is the patient's preferred pharmacy:  [id number] [place] - [address] AT [facility] OF [facility] [address] [facility] [address] Phone: [phone] Fax: [phone]    Has the prescription been filled recently? No  Is the patient out of the medication? Yes  Has the patient been seen for an appointment in the last year OR does the patient have an upcoming appointment? Yes  Can we respond through MyChart? Yes  Agent: Please be advised that Rx refills may take up to 3 business days. We ask that you follow-up with your pharmacy.

## 2023-11-04 NOTE — Telephone Encounter (Signed)
 Per test claim: PA required; PA started via CoverMyMeds. KEY [id number] . Waiting for clinical questions to populate.

## 2023-11-04 NOTE — Telephone Encounter (Signed)
 Can we find out why her Greggory Keen was denied?

## 2023-11-04 NOTE — Telephone Encounter (Signed)
 Pharmacy Patient Advocate Encounter  Received notification from [facility] that Prior Authorization for [facility] 2.5MG /0.5ML auto-injectors  has been DENIED.  Full denial letter will be uploaded to the media tab. See denial reason below.   PA #/Case ID/Reference #:  [id number]      Called insurance at [phone] to see why PA was denied. Did not realize this was a Recruitment consultant. Per representative, Greggory Keen is non-formulary and must show documentation of failing 2 preferred drugs. Medicaid formulary shows Byetta, Trulicity, Victoza, and Ozempic as preferred. Patient has failed 2, resubmitting PA.

## 2023-11-04 NOTE — Addendum Note (Signed)
 Addended by: Doreene Nest on: 11/04/2023 04:09 PM   Modules accepted: Orders

## 2023-11-04 NOTE — Telephone Encounter (Signed)
 Refills sent to pharmacy.

## 2023-11-05 ENCOUNTER — Other Ambulatory Visit (HOSPITAL_COMMUNITY): Payer: Self-pay

## 2023-11-05 NOTE — Telephone Encounter (Signed)
 Pharmacy Patient Advocate Encounter  Received notification from [facility] that Prior Authorization for [facility] 2.5MG /0.5ML auto-injectors has been APPROVED from 11/05/23 to 11/04/24. Ran test claim, Copay is $4. This test claim was processed through [facility]- copay amounts may vary at other pharmacies due to pharmacy/plan contracts, or as the patient moves through the different stages of their insurance plan.   PA #/Case ID/Reference #:  [id number]

## 2023-11-11 ENCOUNTER — Encounter (HOSPITAL_COMMUNITY): Payer: Self-pay

## 2023-11-11 ENCOUNTER — Ambulatory Visit (INDEPENDENT_AMBULATORY_CARE_PROVIDER_SITE_OTHER): Payer: MEDICAID | Admitting: Licensed Clinical Social Worker

## 2023-11-11 DIAGNOSIS — Z91199 Patient's noncompliance with other medical treatment and regimen due to unspecified reason: Secondary | ICD-10-CM

## 2023-11-11 NOTE — Progress Notes (Signed)
 THERAPIST PROGRESS NOTE   Session Date: 11/11/2023  Session Time: 1400  Patient no-showed today's appointment; appointment was for follow up therapy, provider notified for review of record. Today's missed appt results in pt's third no-show, (01/15, 03/05, 04/08), will review attendance concerns related to pt's need to cancel appt's due to work schedule changes at next visit.   Leisa Lenz, MSW, LCSW 11/11/2023,  10:28 AM

## 2023-11-12 DIAGNOSIS — E1165 Type 2 diabetes mellitus with hyperglycemia: Secondary | ICD-10-CM

## 2023-11-24 ENCOUNTER — Other Ambulatory Visit (HOSPITAL_COMMUNITY): Payer: Self-pay

## 2023-11-24 ENCOUNTER — Telehealth: Payer: Self-pay

## 2023-11-24 ENCOUNTER — Ambulatory Visit (HOSPITAL_COMMUNITY): Payer: MEDICAID | Admitting: Licensed Clinical Social Worker

## 2023-11-24 DIAGNOSIS — F331 Major depressive disorder, recurrent, moderate: Secondary | ICD-10-CM

## 2023-11-24 DIAGNOSIS — F411 Generalized anxiety disorder: Secondary | ICD-10-CM

## 2023-11-24 MED ORDER — TIRZEPATIDE 5 MG/0.5ML ~~LOC~~ SOAJ: 5.0000 mg | SUBCUTANEOUS | 0 refills | Status: DC

## 2023-11-24 NOTE — Telephone Encounter (Signed)
 Joan [name], please check on what's going on with her Dexcom

## 2023-11-24 NOTE — Telephone Encounter (Signed)
 Pharmacy Patient Advocate Encounter  Received notification from [facility] that Prior Authorization for Dexcom G7 Sensor has been APPROVED from 11/22/23 to 11/21/24. Unable to obtain price due to refill too soon rejection, last fill date 11/22/23 next available fill date05/12/25   PA #/Case ID/Reference #: Joan [name]

## 2023-11-24 NOTE — Progress Notes (Unsigned)
 THERAPIST PROGRESS NOTE   Session Date: 11/24/2023  Session Time: 1602 - 1653  Virtual Visit via Video Note  I connected with Joan [name] on 11/24/23 at  4:00 PM EDT by a video enabled telemedicine application and verified that I am speaking with the correct person using two identifiers.  Location: Patient: Home Provider: Home Office   I discussed the limitations of evaluation and management by telemedicine and the availability of in person appointments. The patient expressed understanding and agreed to proceed.  The patient was advised to call back or seek an in-person evaluation if the symptoms worsen or if the condition fails to improve as anticipated.  I provided 51 minutes of non-face-to-face time during this encounter.  Participation Level: Active  Behavioral Response: CasualAlertEuthymic  Type of Therapy: Individual Therapy  Treatment Goals addressed:  - STG: Aubre will identify cognitive patterns and beliefs that support depression (OP Depression) - LTG: "Work on changing thought process by challenging automatic negative thoughts and  anxious thoughts in order to improve outlook and perspective" (OP Depression) - LTG: Lamiya will score less than 5 on the Generalized Anxiety Disorder 7 Scale (GAD-7) (Anxiety) - STG: Report a decrease in anxiety symptoms as evidenced by an overall reduction in anxiety score by a minimum of 25% on the Generalized Anxiety Disorder Scale (GAD-7) (Anxiety)   ProgressTowards Goals: Not Progressing  Interventions: CBT, Motivational Interviewing, and Supportive  Summary: Joan [name] is a 43 y.o. female with past psych history of MDD, GAD, Bipolar 2, presenting for follow-up therapy session in efforts to improve management of depressive and anxious symptoms.   Patient actively engaged in session, presenting with depressive and anxious moods and congruent affect throughout.  Patient actively engaged in introductory check-in, engaging in reflection of  missed appointment 2 weeks ago, proving receptive to attendance concerns and history of 3 no-shows, expressing having canceled appointment the day of, proving understanding of justification for no-show, further expressing intent to all prior to 24 hours in advance if needing to cancel future visits.  Patient actively engaged in reflection of the events of the past month, noting of significant increase in stress surrounding employment and previous job offers proving unfruitful.  Patient further reflected of having secured new employment and being satisfied with secured job although noting of increased anxiousness surrounding training modules.  Patient actively engaged in reassessing presenting depressive and anxious symptoms via PHQ-9 and GAD-7, exploring further observed minor increase in screening scores and variances and symptoms.  Actively engaged in review of efforts and journaling, sharing of attempts being unsuccessful.  Revisited use of "challenging anxious thoughts" technique, utilizing skill and processing presenting stressors in relation to new workplace, reflecting on improvements after intervention.  Patient detailed having received contact from trauma therapist referral and having learned of insurance unlikely to reimburse, sharing of willingness to pay out of pocket and potential exploration of trauma therapy referral to ultimate community provider.  Patient continues to meet criteria for MDD, GAD, Bipolar 2. Patient will continue to benefit from engagement in outpatient therapy due to being the least restrictive service to meet presenting needs.      11/24/2023    4:22 PM 10/29/2023    7:53 AM 10/22/2023    4:10 PM 09/24/2023    4:09 PM 09/10/2023    4:14 PM  Depression screen PHQ 2/9  Decreased Interest 0 0 1 0 0  Down, Depressed, Hopeless 1 0 1 0 1  PHQ - 2 Score 1 0 2 0 1  Altered sleeping 3  1 0 0  Tired, decreased energy 0  0 0 0  Change in appetite 2  1 0 0  Feeling bad or failure  about yourself  0  1 0 1  Trouble concentrating 1  0 0 0  Moving slowly or fidgety/restless 0  0 0 0  Suicidal thoughts 0  0 0 0  PHQ-9 Score 7  5 0 2  Difficult doing work/chores Somewhat difficult  Not difficult at all  Not difficult at all      11/24/2023    4:17 PM 10/22/2023    4:07 PM 09/24/2023    4:07 PM 09/10/2023    4:12 PM  GAD 7 : Generalized Anxiety Score  Nervous, Anxious, on Edge 1 1 0 1  Control/stop worrying 0 0 0 0  Worry too much - different things 1 1 0 1  Trouble relaxing 1 0 0 0  Restless 0 0 0 0  Easily annoyed or irritable 1 1 1 1   Afraid - awful might happen 0 0 0 0  Total GAD 7 Score 4 3 1 3   Anxiety Difficulty Somewhat difficult Not difficult at all Not difficult at all Not difficult at all    Suicidal/Homicidal: No  Therapist Response: Clinician utilized CBT, MI, and supportive reflection interventions to support pt in navigating depressive and anxious sxs.  Clinician actively greeted patient upon joining virtual visit, engaging patient in introductory check-in, assessing presenting moods and affect, and eliciting brief reflections of daily events and factors contributing to presenting moods.  Further engaged patient in review of attendance concerns, noting of frequent no-shows and developing plan to support adherence moving forward.  Actively listened to patient's reflection of recent events and stressors, utilizing open-ended questions to further elicit thoughts and feelings in relation to experienced events and challenges.  Provided support to patient and processing recent stressors exploring patient's efforts to utilize learned skills and navigating challenges.  Actively employed CBT, MI, and supportive reflection techniques and interventions to support patient in navigating presenting stressors.  Supported patient in navigating challenges related to securing trauma specific provider, processing potential referral to alternate community provider.  Clinician  reassessed severity of sxs, and presence of any safety concerns. Clinician provided support and empathy to patient during session.   Plan: Return again in 2-3 weeks.  Diagnosis:  Encounter Diagnoses  Name Primary?   Major depressive disorder, recurrent episode, moderate (HCC) Yes   GAD (generalized anxiety disorder)     Collaboration of Care: Other None necessary at this time.  Patient/Guardian was advised Release of Information must be obtained prior to any record release in order to collaborate their care with an outside provider. Patient/Guardian was advised if they have not already done so to contact the registration department to sign all necessary forms in order for us  to release information regarding their care.   Consent: Patient/Guardian gives verbal consent for treatment and assignment of benefits for services provided during this visit. Patient/Guardian expressed understanding and agreed to proceed.   Patsi Boots, MSW, LCSW 11/24/2023,  4:30 PM

## 2023-11-27 NOTE — Telephone Encounter (Signed)
 PA was approved 11/24/2023. Closing encounter.

## 2023-12-01 ENCOUNTER — Encounter (HOSPITAL_COMMUNITY): Payer: Self-pay | Admitting: Licensed Clinical Social Worker

## 2023-12-01 ENCOUNTER — Telehealth (HOSPITAL_COMMUNITY): Payer: Self-pay | Admitting: Licensed Clinical Social Worker

## 2023-12-01 NOTE — Telephone Encounter (Signed)
 Per patient's request, patient will no longer see provider due to conflicting work schedule. Patient stated she will be refered to new therapist by her PCP. Patient was given dismissal letter in person and mychart on 12/01/2023

## 2023-12-08 ENCOUNTER — Other Ambulatory Visit: Payer: Self-pay

## 2023-12-08 ENCOUNTER — Encounter: Payer: Self-pay | Admitting: Internal Medicine

## 2023-12-08 ENCOUNTER — Ambulatory Visit (INDEPENDENT_AMBULATORY_CARE_PROVIDER_SITE_OTHER): Payer: MEDICAID | Admitting: Internal Medicine

## 2023-12-08 ENCOUNTER — Ambulatory Visit (HOSPITAL_COMMUNITY): Payer: MEDICAID | Admitting: Licensed Clinical Social Worker

## 2023-12-08 VITALS — BP 126/82 | HR 103 | Temp 97.5°F | Resp 18 | Ht 66.0 in | Wt 298.0 lb

## 2023-12-08 DIAGNOSIS — T7800XD Anaphylactic reaction due to unspecified food, subsequent encounter: Secondary | ICD-10-CM

## 2023-12-08 DIAGNOSIS — T7800XA Anaphylactic reaction due to unspecified food, initial encounter: Secondary | ICD-10-CM

## 2023-12-08 DIAGNOSIS — J3089 Other allergic rhinitis: Secondary | ICD-10-CM

## 2023-12-08 DIAGNOSIS — J4540 Moderate persistent asthma, uncomplicated: Secondary | ICD-10-CM

## 2023-12-08 DIAGNOSIS — H1045 Other chronic allergic conjunctivitis: Secondary | ICD-10-CM

## 2023-12-08 MED ORDER — FLUTICASONE PROPIONATE HFA 44 MCG/ACT IN AERO
2.0000 | INHALATION_SPRAY | Freq: Two times a day (BID) | RESPIRATORY_TRACT | 12 refills | Status: AC
Start: 2023-12-08 — End: ?

## 2023-12-08 MED ORDER — AZELASTINE HCL 0.1 % NA SOLN: 2.0000 | Freq: Two times a day (BID) | NASAL | 12 refills | Status: AC

## 2023-12-08 MED ORDER — CETIRIZINE HCL 10 MG PO TABS: 10.0000 mg | ORAL_TABLET | Freq: Every day | ORAL | 5 refills | Status: AC

## 2023-12-08 MED ORDER — FLUTICASONE PROPIONATE 50 MCG/ACT NA SUSP: 1.0000 | Freq: Two times a day (BID) | NASAL | 2 refills | Status: AC

## 2023-12-08 NOTE — Progress Notes (Unsigned)
 NEW PATIENT Date of Service/Encounter:  12/08/23 Referring provider: Gabriel John, NP Primary care provider: Gabriel John, NP  Subjective:  Joan [name] is a 43 y.o. female  presenting today for evaluation of asthma, rhinitis and food allergies History obtained from: chart review and patient.   Discussed the use of AI scribe software for clinical note transcription with the patient, who gave verbal consent to proceed.  History of Present Illness Joan [name] is a 43 year old female with asthma, environmental allergies, and food allergies who presents for evaluation and management of these conditions.  Asthma was first diagnosed as an adult following an episode of bronchitis approximately 15 years ago. She has been treated with albuterol  as needed and has never used a daily inhaler. In 2022, she was hospitalized for an asthma exacerbation, requiring multiple breathing treatments and a course of steroids. Over the past year, she has been on steroids at least three times, primarily for bronchitis, which she now understands to be asthma flares. Her asthma is triggered by viral infections, seasonal changes, and pollen. She uses albuterol  once every three to four weeks recently.  She experiences environmental allergies with symptoms including facial and eye swelling, throat tightness, and itching in the mouth and throat, particularly during pollen season. These symptoms persist year-round but are most severe in the spring. She currently uses Benadryl  for her allergies and has not used nasal sprays or eye drops.  She reports food allergies, particularly to almonds, peanuts, walnuts, and seafood, which cause hives and throat tightness. She has been tested for food allergies, but results were inconclusive. She experiences symptoms when exposed to seafood, especially in environments where it is being fried, which she describes as causing her throat to feel like it's closing and initiating  itching and drainage.  No smoking. No current issues with reflux or heartburn, though it can flare with certain medications. Has had COVID and flu, with COVID occurring this year and flu six months prior. No recent need for frequent albuterol  use, approximately once every three to four weeks.    Chart Review:  Reviewed PCP notes from referral ***: ***  Other allergy screening: Asthma: yes Rhino conjunctivitis: yes Food allergy: yes Medication allergy: {Blank single:19197::"yes","no"} Hymenoptera allergy: {Blank single:19197::"yes","no"} Urticaria: {Blank single:19197::"yes","no"} Eczema:{Blank single:19197::"yes","no"} History of recurrent infections suggestive of immunodeficency: {Blank single:19197::"yes","no"} ***Vaccinations are up to date.   Past Medical History: Past Medical History:  Diagnosis Date   Acute asthma exacerbation 04/10/2018   Acute bacterial conjunctivitis of right eye 05/01/2023   Acute non-recurrent maxillary sinusitis 09/25/2018   Asthma    Asthma exacerbation 08/03/2020   Auditory hallucinations    only after anesthesia   BV (bacterial vaginosis) 02/13/2018   COVID-19 virus infection 08/16/2019   Diabetes mellitus without complication (HCC)    diet controlled   Diabetes mellitus, type II (HCC)    insulin , jardiance    Dyspnea    with exertion   Essential hypertension    Essential hypertension 11/24/2017   GERD (gastroesophageal reflux disease)    occasionally-NO MEDS   History of placement of ear tubes 05/20/2018   Hyperlipidemia    Kidney stone 02/11/2019   Localized skin mass, lump, or swelling 01/26/2021   MDD (major depressive disorder)    Miscarriage    Nausea & vomiting 10/05/2019   PTSD (post-traumatic stress disorder)    Right lower quadrant abdominal pain 02/13/2018   Tachycardia    Medication List:  Current Outpatient Medications  Medication Sig  Dispense Refill   albuterol  (VENTOLIN  HFA) 108 (90 Base) MCG/ACT inhaler Inhale 1-2  puffs into the lungs every 6 (six) hours as needed for wheezing or shortness of breath. 8 g 0   atorvastatin  (LIPITOR) 20 MG tablet Take 1 tablet (20 mg total) by mouth daily. for cholesterol. 90 tablet 3   Continuous Glucose Receiver (DEXCOM G7 RECEIVER) DEVI Use to check blood sugars. 1 each 0   Continuous Glucose Sensor (DEXCOM G7 SENSOR) MISC Change every 10 days to check blood sugars continuously 3 each 3   hydrOXYzine  (ATARAX ) 25 MG tablet Take 1 tablet (25 mg total) by mouth 2 (two) times daily. Take 2 tablet  ( 50mg  total ) by mouth at night 60 tablet 0   insulin  glargine (LANTUS ) 100 UNIT/ML Solostar Pen Inject 50 units every morning and 15 units every evening for diabetes. 60 mL 1   Insulin  Pen Needle (PEN NEEDLES) 31G X 6 MM MISC Use nightly with insulin . 100 each 3   mirtazapine  (REMERON ) 7.5 MG tablet Take 1 tablet (7.5 mg total) by mouth at bedtime. 90 tablet 1   propranolol  ER (INDERAL  LA) 80 MG 24 hr capsule Take 1 capsule (80 mg total) by mouth at bedtime. For headache prevention 90 capsule 1   sertraline  (ZOLOFT ) 50 MG tablet Take 1 tablet (50 mg total) by mouth daily. 90 tablet 1   SUMAtriptan  (IMITREX ) 50 MG tablet Take 1 tablet by mouth at migraine onset. May repeat in 2 hours if headache persists or recurs. 10 tablet 0   tirzepatide  (MOUNJARO ) 5 MG/0.5ML Pen Inject 5 mg into the skin once a week. for diabetes. 2 mL 0   valsartan  (DIOVAN ) 80 MG tablet Take 1 tablet (80 mg total) by mouth daily. for blood pressure. 90 tablet 3   gabapentin  (NEURONTIN ) 100 MG capsule Take 1 capsule (100 mg total) by mouth 2 (two) times daily as needed. 90 capsule 1   No current facility-administered medications for this visit.   Known Allergies:  Allergies  Allergen Reactions   Ibuprofen Swelling and Other (See Comments)    Facial    Ciprofloxacin Hives and Rash   Metformin  And Related Other (See Comments)    Elevated Lactic Acid   Penicillins Hives, Rash and Other (See Comments)    Has  patient had a PCN reaction causing immediate rash, facial/tongue/throat swelling, SOB or lightheadedness with hypotension: No Has patient had a PCN reaction causing severe rash involving mucus membranes or skin necrosis: No Has patient had a PCN reaction that required hospitalization: No Has patient had a PCN reaction occurring within the last 10 years: No If all of the above answers are "NO", then may proceed with Cephalosporin use. THE PATIENT IS ABLE TO TOLERATE CEPHALOSPORINS WITHOUT DIFFIC   Sulfa Antibiotics Hives and Rash   Other Other (See Comments)    ALL NUTS-SCRATCHY THROAT   Shellfish Allergy Other (See Comments)    ALL SEAFOOD-SCRATCHY THROAT   Trazodone  And Nefazodone Other (See Comments)    "Causes me to hear voices"   Past Surgical History: Past Surgical History:  Procedure Laterality Date   ABDOMINAL HYSTERECTOMY     ADENOIDECTOMY     CHOLECYSTECTOMY  2009   CYSTOSCOPY N/A 07/31/2018   Procedure: CYSTOSCOPY;  Surgeon: Prescilla Brod, MD;  Location: [facility];  Service: Gynecology;  Laterality: N/A;   LAPAROSCOPIC BILATERAL SALPINGECTOMY Bilateral 07/31/2018   Procedure: LAPAROSCOPIC BILATERAL SALPINGECTOMY;  Surgeon: Prescilla Brod, MD;  Location: [facility];  Service: Gynecology;  Laterality: Bilateral;   LAPAROSCOPIC HYSTERECTOMY N/A 07/31/2018   Procedure: HYSTERECTOMY TOTAL LAPAROSCOPIC;  Surgeon: Prescilla Brod, MD;  Location: [facility];  Service: Gynecology;  Laterality: N/A;   LAPAROSCOPY N/A 06/07/2019   Procedure: LAPAROSCOPY OPERATIVE, WITH PERITONEAL BIOPSIES;  Surgeon: Prescilla Brod, MD;  Location: [facility];  Service: Gynecology;  Laterality: N/A;   LYSIS OF ADHESION N/A 07/31/2018   Procedure: LYSIS OF ADHESION;  Surgeon: Prescilla Brod, MD;  Location: [facility];  Service: Gynecology;  Laterality: N/A;   OVARY SURGERY Right    cyst removed a while ago   TONSILLECTOMY     tubes in ear     Family History: Family History  Problem Relation Age of  Onset   Asthma Mother    Diabetes Mother    Hyperlipidemia Mother    Hypertension Mother    Diabetes Father    Hyperlipidemia Father    Hypertension Father    Diabetes Brother    Depression Brother    Alcohol abuse Brother    Depression Maternal Grandmother    Stroke Paternal Grandmother    Breast cancer Neg Hx    Social History: Deatra lives single-family home this 43 years old.  Hardwood throughout.  Would heating and electric heating, window and fan for cooling.  2 dogs with access to bedroom.  No roaches in the house and best to be off floor.  No dust mite precautions.  No tobacco spokes.  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   ROS:  All other systems negative except as noted per HPI.  Objective:  Blood pressure 126/82, pulse (!) 103, temperature (!) 97.5 F (36.4 C), temperature source Temporal, resp. rate 18, height 5\' 6"  (1.676 m), weight 298 lb (135.2 kg), last menstrual period 07/24/2018, SpO2 100%. Body mass index is 48.1 kg/m. Physical Exam:  General Appearance:  Alert, cooperative, no distress, appears stated age  Head:  Normocephalic, without obvious abnormality, atraumatic  Eyes:  Conjunctiva clear, EOM's intact  Ears Serous fluid bilaterally and EACs normal bilaterally  Nose: Nares normal,  Pink mucosa, clear rhinorrhea, hypertrophic turbinates, no visible anterior polyps, and septum midline  Throat: Lips, tongue normal; teeth and gums normal, normal posterior oropharynx  Neck: Supple, symmetrical  Lungs:   clear to auscultation bilaterally, Respirations unlabored, no coughing  Heart:  regular rate and rhythm and no murmur, Appears well perfused  Extremities: No edema  Skin: Skin color, texture, turgor normal and no rashes or lesions on visualized portions of skin  Neurologic: No gross deficits   Diagnostics: Spirometry:  Tracings reviewed. Her effort: Good reproducible efforts. FVC: 2.65L (pre), 2.99L  (post) FEV1: 2.32L, 75% predicted (pre), 2.49L, 80%  predicted (post) FEV1/FVC ratio: 84 (pre), 83 (post) Interpretation: Spirometry consistent with normal pattern.  After 4 puffs of albuterol  there was a 13% increase in [facility] which is a significant postbronchodilator response Please see scanned spirometry results for details.   Labs:  Lab Orders  No laboratory test(s) ordered today     Assessment and Plan  Assessment and Plan Assessment & Plan Allergic Rhinitis  Chronic environmental allergies with inadequate response to Benadryl . Recommended cetirizine  for improved symptom control. - Discontinue Benadryl . - Initiate cetirizine  (Zyrtec ) 10mg  tab daily. - Start Flonase  1 spray per nostril twice daily. Aim upward and outward  - Start Astelin 1 spray per nostril daily    Allergic Conjunctivitis - Continue Allergen avoidance as instructed - Avoiding rubbing eyes, if irritated use a wet wash cloth to wipe allergen  out of eyes  - Start Allergy Eye drops: great options include Pataday (Olopatadine) or Zaditor (ketotifen) for eye symptoms daily as needed-both sold over the counter if not covered by insurance.   -Avoid eye drops that say red eye relief as they may contain medications that dry out your eyes. - Consider allergen immunotherapy if symptoms worsen or you desire to reduce lifetime use of medications.    Food allergies Allergic reactions to nuts and seafood with symptoms suggesting true food allergies. Discussed aerosolization of shellfish proteins as a trigger. Cetirizine  may help prevent contact reactions. - Prescribe cetirizine  (Zyrtec ) 10mg  daily  - Advise carrying an extra cetirizine  tablet for acute symptoms and removing self from triggering environments in seafood reaction - Prescribe an EpiPen  and provide an allergy action plan with instructions on use. - Schedule allergy testing, advising to hold cetirizine  for 3 days prior to testing.  Asthma, moderate persistent not well controlled  Asthma with exacerbations triggered  by infections and seasonal changes. Emphasized importance of control to prevent severe symptoms. Plan to initiate inhaled corticosteroids for better management and reduced systemic side effects. - Initiate Flovent  44mcg inhaler, 2 puffs twice daily with a spacer, and instruct to rinse mouth after use. - Rinse mouth out after use - Exhale fully before each puff - Use Albuterol  (Proair /Ventolin ) 2 puffs every 4-6 hours as needed for chest tightness, wheezing, or coughing - Use Albuterol  (Proair /Ventolin ) 2 puffs 15 minutes prior to exercise if you have symptoms with activity - Use a spacer with all inhalers - please keep track of how often you are needing rescue inhaler Albuterol  (Proair /Ventolin ) as this will help guide future management - Asthma is not controlled if:  - Symptoms are occurring >2 times a week  during the day  OR  - >2 times a month nighttime awakenings  - Please call the clinic to schedule a follow up if these symptoms arise   Follow up: for allergy testing (1-55, seafood and nuts)   Thank you so much for letting me partake in your care today.  Don't hesitate to reach out if you have any additional concerns!  Orelia Binet, MD  [facility] [place], [place]     This note in its entirety was forwarded to the Provider who requested this consultation.  Other: reviewed spirometry technique and reviewed inhaler technique  Thank you for your kind referral. I appreciate the opportunity to take part in Joan [name]'s care. Please do not hesitate to contact me with questions.  Sincerely,  Thank you so much for letting me partake in your care today.  Don't hesitate to reach out if you have any additional concerns!  Orelia Binet, MD  [facility] [place], [place]

## 2023-12-08 NOTE — Patient Instructions (Signed)
 Allergic Rhinitis  Chronic environmental allergies with inadequate response to Benadryl . Recommended cetirizine  for improved symptom control. - Discontinue Benadryl . - Initiate cetirizine  (Zyrtec ) 10mg  tab daily. - Start Flonase  1 spray per nostril twice daily. Aim upward and outward  - Start Astelin 1 spray per nostril daily    Allergic Conjunctivitis - Continue Allergen avoidance as instructed - Avoiding rubbing eyes, if irritated use a wet wash cloth to wipe allergen out of eyes  - Start Allergy Eye drops: great options include Pataday (Olopatadine) or Zaditor (ketotifen) for eye symptoms daily as needed-both sold over the counter if not covered by insurance.   -Avoid eye drops that say red eye relief as they may contain medications that dry out your eyes. - Consider allergen immunotherapy if symptoms worsen or you desire to reduce lifetime use of medications.    Food allergies Allergic reactions to nuts and seafood with symptoms suggesting true food allergies. Discussed aerosolization of shellfish proteins as a trigger. Cetirizine  may help prevent contact reactions. - Prescribe cetirizine  (Zyrtec ) 10mg  daily  - Advise carrying an extra cetirizine  tablet for acute symptoms and removing self from triggering environments in seafood reaction - Prescribe an EpiPen  and provide an allergy action plan with instructions on use. - Schedule allergy testing, advising to hold cetirizine  for 3 days prior to testing.  Asthma, moderate persistent not well controlled  Asthma with exacerbations triggered by infections and seasonal changes. Emphasized importance of control to prevent severe symptoms. Plan to initiate inhaled corticosteroids for better management and reduced systemic side effects. - Initiate Flovent  44mcg inhaler, 2 puffs twice daily with a spacer, and instruct to rinse mouth after use. - Rinse mouth out after use - Exhale fully before each puff - Use Albuterol  (Proair /Ventolin ) 2 puffs  every 4-6 hours as needed for chest tightness, wheezing, or coughing - Use Albuterol  (Proair /Ventolin ) 2 puffs 15 minutes prior to exercise if you have symptoms with activity - Use a spacer with all inhalers - please keep track of how often you are needing rescue inhaler Albuterol  (Proair /Ventolin ) as this will help guide future management - Asthma is not controlled if:  - Symptoms are occurring >2 times a week  during the day  OR  - >2 times a month nighttime awakenings  - Please call the clinic to schedule a follow up if these symptoms arise   Follow up: for allergy testing (1-55, seafood and nuts)

## 2023-12-09 ENCOUNTER — Other Ambulatory Visit: Payer: Self-pay

## 2023-12-09 ENCOUNTER — Other Ambulatory Visit (HOSPITAL_COMMUNITY): Payer: Self-pay | Admitting: *Deleted

## 2023-12-09 ENCOUNTER — Telehealth (HOSPITAL_COMMUNITY): Payer: Self-pay | Admitting: *Deleted

## 2023-12-09 ENCOUNTER — Telehealth: Payer: Self-pay | Admitting: Internal Medicine

## 2023-12-09 MED ORDER — GABAPENTIN 100 MG PO CAPS: 100.0000 mg | ORAL_CAPSULE | Freq: Two times a day (BID) | ORAL | 1 refills | Status: DC | PRN

## 2023-12-09 MED ORDER — EPINEPHRINE 0.3 MG/0.3ML IJ SOSY: 0.3000 mg | PREFILLED_SYRINGE | INTRAMUSCULAR | 1 refills | Status: AC | PRN

## 2023-12-09 NOTE — Telephone Encounter (Signed)
 Pt states epi-pen was not sent in to the pharmacy

## 2023-12-09 NOTE — Telephone Encounter (Signed)
 Pt called requesting a refill of the Gabapentin  100 mg BID PRN. Last prescription sent to pharmacy on 08/18/23 (last visit) for #90 with 1 refill. Pt has a f/u scheduled for 02/16/24. Please review. Thanks.

## 2023-12-09 NOTE — Telephone Encounter (Signed)
 Patient informed generic sent in epipen  was a tier 2 not covered/preferred. No questions or concerns

## 2023-12-09 NOTE — Telephone Encounter (Signed)
 Done

## 2023-12-11 NOTE — Patient Instructions (Addendum)
 Allergic Rhinitis  - skin testing today is positive to birch mix, dust mite mix, cat hair, dog epithelia, and horse with a poor histamine response.  We will get lab work to complement your skin testing.  We will call you with results once they are back - we will give you avoidance measures once they lab results are back - copy of skin test given Chronic environmental allergies with inadequate response to Benadryl . Recommended cetirizine  for improved symptom control. - Discontinue Benadryl . - Initiate cetirizine  (Zyrtec ) 10mg  tab daily. - Continue Flonase  1 spray per nostril twice daily. Aim upward and outward  - Continue Astelin  1 spray per nostril daily    Allergic Conjunctivitis - Continue Allergen avoidance as instructed - Avoiding rubbing eyes, if irritated use a wet wash cloth to wipe allergen out of eyes  - Start Allergy Eye drops: great options include Pataday (Olopatadine) or Zaditor (ketotifen) for eye symptoms daily as needed-both sold over the counter if not covered by insurance.   -Avoid eye drops that say red eye relief as they may contain medications that dry out your eyes. - Consider allergen immunotherapy if symptoms worsen or you desire to reduce lifetime use of medications.    Food allergies Allergic reactions to nuts and seafood with symptoms suggesting true food allergies. Discussed aerosolization of shellfish proteins as a trigger. Cetirizine  may help prevent contact reactions. - Prescribe cetirizine  (Zyrtec ) 10mg  daily  - Advise carrying an extra cetirizine  tablet for acute symptoms and removing self from triggering environments in seafood reaction - Access to your epinephrine  autoinjector device at all times and follow emergency action plan - Skin testing today is negative to nuts, fish, and shellfish with a poor histamine response.  She reports that she has already had lab work to these foods, but results are not back yet  Asthma, moderate persistent Asthma with  exacerbations triggered by infections and seasonal changes. Emphasized importance of control to prevent severe symptoms. Plan to initiate inhaled corticosteroids for better management and reduced systemic side effects. - Continue Flovent  44mcg inhaler, 2 puffs twice daily with a spacer, and instruct to rinse mouth after use. - Rinse mouth out after use - Exhale fully before each puff - Use Albuterol  (Proair /Ventolin ) 2 puffs every 4-6 hours as needed for chest tightness, wheezing, or coughing - Use Albuterol  (Proair /Ventolin ) 2 puffs 15 minutes prior to exercise if you have symptoms with activity - Use a spacer with all inhalers - please keep track of how often you are needing rescue inhaler Albuterol  (Proair /Ventolin ) as this will help guide future management - Asthma is not controlled if:  - Symptoms are occurring >2 times a week  during the day  OR  - >2 times a month nighttime awakenings  - Please call the clinic to schedule a follow up if these symptoms arise   Follow up:6-8 weeks or sooner if needed

## 2023-12-12 ENCOUNTER — Ambulatory Visit (INDEPENDENT_AMBULATORY_CARE_PROVIDER_SITE_OTHER): Payer: MEDICAID | Admitting: Family

## 2023-12-12 ENCOUNTER — Encounter: Payer: Self-pay | Admitting: Family

## 2023-12-12 DIAGNOSIS — J3089 Other allergic rhinitis: Secondary | ICD-10-CM

## 2023-12-12 DIAGNOSIS — T7800XD Anaphylactic reaction due to unspecified food, subsequent encounter: Secondary | ICD-10-CM | POA: Diagnosis not present

## 2023-12-12 DIAGNOSIS — T7800XA Anaphylactic reaction due to unspecified food, initial encounter: Secondary | ICD-10-CM

## 2023-12-12 LAB — IGE NUT PROF. W/COMPONENT RFLX

## 2023-12-12 NOTE — Progress Notes (Signed)
 Date of Service/Encounter:  12/12/23  Allergy testing appointment   Initial visit on Dec 08, 2023 by Dr. Jolayne Natter, seen for allergic rhinitis, allergic conjunctivitis, food allergies, and moderate persistent asthma.  Please see that note for additional details.     Today reports for allergy diagnostic testing:  Skin testing today is positive to birch mix, dust mite mix, cat hair, dog epithelia, and horse with a poor histamine response.  Lab work to peanuts, tree nuts, fish and shellfish were negative with a poor histamine response.  DIAGNOSTICS:  Skin Testing: Environmental allergy panel and select foods. Adequate positive and negative controls Results discussed with patient/family.   Allergy testing results were read and interpreted by myself, documented by clinical staff.  Patient provided with copy of allergy testing along with avoidance measures when indicated.   Allergic Rhinitis  - skin testing today is positive to birch mix, dust mite mix, cat hair, dog epithelia, and horse with a poor histamine response.  We will get lab work to complement your skin testing.  We will call you with results once they are back - we will give you avoidance measures once the lab results are back - copy of skin test given Chronic environmental allergies with inadequate response to Benadryl . Recommended cetirizine  for improved symptom control. - Discontinue Benadryl . - Initiate cetirizine  (Zyrtec ) 10mg  tab daily. - Continue Flonase  1 spray per nostril twice daily. Aim upward and outward  - Continue Astelin  1 spray per nostril daily    Allergic Conjunctivitis - Continue Allergen avoidance as instructed - Avoiding rubbing eyes, if irritated use a wet wash cloth to wipe allergen out of eyes  - Start Allergy Eye drops: great options include Pataday (Olopatadine) or Zaditor (ketotifen) for eye symptoms daily as needed-both sold over the counter if not covered by insurance.   -Avoid eye drops that say  red eye relief as they may contain medications that dry out your eyes. - Consider allergen immunotherapy if symptoms worsen or you desire to reduce lifetime use of medications.    Food allergies Allergic reactions to nuts and seafood with symptoms suggesting true food allergies. Discussed aerosolization of shellfish proteins as a trigger. Cetirizine  may help prevent contact reactions. - Prescribe cetirizine  (Zyrtec ) 10mg  daily  - Advise carrying an extra cetirizine  tablet for acute symptoms and removing self from triggering environments in seafood reaction - Access to your epinephrine  autoinjector device at all times and follow emergency action plan - Skin testing today is negative to nuts, fish, and shellfish with a poor histamine response.  She reports that she has already had lab work to these foods, but results are not back yet.  Asthma, moderate persistent Asthma with exacerbations triggered by infections and seasonal changes. Emphasized importance of control to prevent severe symptoms. Plan to initiate inhaled corticosteroids for better management and reduced systemic side effects. - Continue Flovent  44mcg inhaler, 2 puffs twice daily with a spacer, and instruct to rinse mouth after use. - Rinse mouth out after use - Exhale fully before each puff - Use Albuterol  (Proair /Ventolin ) 2 puffs every 4-6 hours as needed for chest tightness, wheezing, or coughing - Use Albuterol  (Proair /Ventolin ) 2 puffs 15 minutes prior to exercise if you have symptoms with activity - Use a spacer with all inhalers - please keep track of how often you are needing rescue inhaler Albuterol  (Proair /Ventolin ) as this will help guide future management - Asthma is not controlled if:  - Symptoms are occurring >2 times a week  during the  day  OR  - >2 times a month nighttime awakenings  - Please call the clinic to schedule a follow up if these symptoms arise   Follow up:6-8 weeks or sooner if needed  Joan Forehand, [name] 

## 2023-12-13 LAB — PANEL 604726
Cor A 1 IgE: 0.86 kU/L — AB
Cor A 14 IgE: <0.1 kU/L
Cor A 8 IgE: <0.1 kU/L
Cor A 9 IgE: <0.1 kU/L

## 2023-12-13 LAB — ALLERGY PANEL 19, SEAFOOD GROUP
Allergen Salmon IgE: <0.1 kU/L
Catfish: <0.1 kU/L
Codfish IgE: <0.1 kU/L
F023-IgE Crab: <0.1 kU/L
F080-IgE Lobster: <0.1 kU/L
Shrimp IgE: <0.1 kU/L
Tuna: <0.1 kU/L

## 2023-12-13 LAB — IGE NUT PROF. W/COMPONENT RFLX: F017-IgE Hazelnut (Filbert): 0.67 kU/L — AB

## 2023-12-13 LAB — ALLERGEN COMPONENT COMMENTS

## 2023-12-15 ENCOUNTER — Ambulatory Visit (HOSPITAL_COMMUNITY): Payer: MEDICAID | Admitting: Licensed Clinical Social Worker

## 2023-12-15 LAB — ALLERGENS W/TOTAL IGE AREA 2
Alternaria Alternata IgE: <0.1 kU/L
Aspergillus Fumigatus IgE: <0.1 kU/L
Bermuda Grass IgE: 0.12 kU/L — AB
Cat Dander IgE: 1.7 kU/L — AB
Cedar, Mountain IgE: <0.1 kU/L
Cladosporium Herbarum IgE: <0.1 kU/L
Cockroach, German IgE: 0.12 kU/L — AB
Common Silver Birch IgE: 0.96 kU/L — AB
Cottonwood IgE: <0.1 kU/L
D Farinae IgE: 4.55 kU/L — AB
D Pteronyssinus IgE: 5.71 kU/L — AB
Dog Dander IgE: 0.69 kU/L — AB
Elm, American IgE: <0.1 kU/L
IgE (Immunoglobulin E), Serum: 46 [IU]/mL (ref 6–495)
Johnson Grass IgE: 0.11 kU/L — AB
Maple/Box Elder IgE: <0.1 kU/L
Mouse Urine IgE: <0.1 kU/L
Oak, White IgE: 1.28 kU/L — AB
Pecan, Hickory IgE: 0.14 kU/L — AB
Penicillium Chrysogen IgE: <0.1 kU/L
Pigweed, Rough IgE: <0.1 kU/L
Ragweed, Short IgE: <0.1 kU/L
Sheep Sorrel IgE Qn: <0.1 kU/L
Timothy Grass IgE: 0.22 kU/L — AB
White Mulberry IgE: <0.1 kU/L

## 2023-12-15 NOTE — Progress Notes (Signed)
 Please let Joan [name] know that her lab work came back very high to  dust mite, moderate to cat, dog, tree and low to grass pollen and cockroach.  Start avoidance measures as below.  Control of Dust Mite Allergen Dust mites play a major role in allergic asthma and rhinitis. They occur in environments with high humidity wherever human skin is found. Dust mites absorb humidity from the atmosphere (ie, they do not drink) and feed on organic matter (including shed human and animal skin). Dust mites are a microscopic type of insect that you cannot see with the naked eye. High levels of dust mites have been detected from mattresses, pillows, carpets, upholstered furniture, bed covers, clothes, soft toys and any woven material. The principal allergen of the dust mite is found in its feces. A gram of dust may contain 1,000 mites and 250,000 fecal particles. Mite antigen is easily measured in the air during house cleaning activities. Dust mites do not bite and do not cause harm to humans, other than by triggering allergies/asthma.  Ways to decrease your exposure to dust mites in your home:  1. Encase mattresses, box springs and pillows with a mite-impermeable barrier or cover  2. Wash sheets, blankets and drapes weekly in hot water (130 F) with detergent and dry them in a dryer on the hot setting.  3. Have the room cleaned frequently with a vacuum cleaner and a damp dust-mop. For carpeting or rugs, vacuuming with a vacuum cleaner equipped with a high-efficiency particulate air (HEPA) filter. The dust mite allergic individual should not be in a room which is being cleaned and should wait 1 hour after cleaning before going into the room.  4. Do not sleep on upholstered furniture (eg, couches).  5. If possible removing carpeting, upholstered furniture and drapery from the home is ideal. Horizontal blinds should be eliminated in the rooms where the person spends the most time (bedroom, study, television room).  Washable vinyl, roller-type shades are optimal.  6. Remove all non-washable stuffed toys from the bedroom. Wash stuffed toys weekly like sheets and blankets above.  7. Reduce indoor humidity to less than 50%. Inexpensive humidity monitors can be purchased at most hardware stores. Do not use a humidifier as can make the problem worse and are not recommended.  Control of Dog or Cat Allergen Avoidance is the best way to manage a dog or cat allergy. If you have a dog or cat and are allergic to dog or cats, consider removing the dog or cat from the home. If you have a dog or cat but don't want to find it a new home, or if your family wants a pet even though someone in the household is allergic, here are some strategies that may help keep symptoms at bay:  1. Keep the pet out of your bedroom and restrict it to only a few rooms. Be advised that keeping the dog or cat in only one room will not limit the allergens to that room. 2. Don't pet, hug or kiss the dog or cat; if you do, wash your hands with soap and water. 3. High-efficiency particulate air (HEPA) cleaners run continuously in a bedroom or living room can reduce allergen levels over time. 4. Regular use of a high-efficiency vacuum cleaner or a central vacuum can reduce allergen levels. 5. Giving your dog or cat a bath at least once a week can reduce airborne allergen.  Reducing Pollen Exposure The American Academy of Allergy, Asthma and Immunology suggests the  following steps to reduce your exposure to pollen during allergy seasons. 1. Do not hang sheets or clothing out to dry; pollen may collect on these items. 2. Do not mow lawns or spend time around freshly cut grass; mowing stirs up pollen. 3. Keep windows closed at night. Keep car windows closed while driving. 4. Minimize morning activities outdoors, a time when pollen counts are usually at their highest. 5. Stay indoors as much as possible when pollen counts or humidity is high and on  windy days when pollen tends to remain in the air longer. 6. Use air conditioning when possible. Many air conditioners have filters that trap the pollen spores. 7. Use a HEPA room air filter to remove pollen form the indoor air you breathe. Control of Cockroach Allergen  Cockroach allergen has been identified as an important cause of acute attacks of asthma, especially in urban settings. There are fifty-five species of cockroach that exist in the [place] , however only three, the [place], [place] and [place] species produce allergen that can affect patients with Asthma. Allergens can be obtained from fecal particles, egg casings and secretions from cockroaches.  1. Remove food sources. 2. Reduce access to water. 3. Seal access and entry points. 4. Spray runways with 0.5-1% Diazinon or Chlorpyrifos 5. Blow boric acid power under stoves and refrigerator. 6. Place bait stations (hydramethylnon) at feeding sites.

## 2023-12-16 ENCOUNTER — Ambulatory Visit: Payer: Self-pay | Admitting: Internal Medicine

## 2023-12-16 NOTE — Progress Notes (Signed)
 Blood work was overall negative for nuts and seafood.  We can consider oral food challenges pending the results of the skin test.  Can someone let patient know?

## 2023-12-18 DIAGNOSIS — E1165 Type 2 diabetes mellitus with hyperglycemia: Secondary | ICD-10-CM

## 2023-12-18 MED ORDER — TIRZEPATIDE 7.5 MG/0.5ML ~~LOC~~ SOAJ: 7.5000 mg | SUBCUTANEOUS | 0 refills | Status: DC

## 2023-12-20 ENCOUNTER — Telehealth: Payer: MEDICAID | Admitting: Emergency Medicine

## 2023-12-20 DIAGNOSIS — L259 Unspecified contact dermatitis, unspecified cause: Secondary | ICD-10-CM

## 2023-12-20 MED ORDER — TRIAMCINOLONE ACETONIDE 0.1 % EX CREA: 1.0000 | TOPICAL_CREAM | Freq: Two times a day (BID) | CUTANEOUS | 0 refills | Status: DC

## 2023-12-20 NOTE — Patient Instructions (Signed)
 Joan [name], thank you for joining Blinda Burger, NP for today's virtual visit.  While this provider is not your primary care provider (PCP), if your PCP is located in our provider database this encounter information will be shared with them immediately following your visit.   A [facility] MyChart account gives you access to today's visit and all your visits, tests, and labs performed at [facility] " click here if you don't have a [facility] MyChart account or go to [email or web address]  Consent: (Patient) Joan [name] provided verbal consent for this virtual visit at the beginning of the encounter.  Current Medications:  Current Outpatient Medications:    triamcinolone  cream (KENALOG) 0.1 %, Apply 1 Application topically 2 (two) times daily., Disp: 45 g, Rfl: 0   albuterol  (VENTOLIN  HFA) 108 (90 Base) MCG/ACT inhaler, Inhale 1-2 puffs into the lungs every 6 (six) hours as needed for wheezing or shortness of breath., Disp: 8 g, Rfl: 0   atorvastatin  (LIPITOR) 20 MG tablet, Take 1 tablet (20 mg total) by mouth daily. for cholesterol., Disp: 90 tablet, Rfl: 3   azelastine  (ASTELIN ) 0.1 % nasal spray, Place 2 sprays into both nostrils 2 (two) times daily. Use in each nostril as directed, Disp: 30 mL, Rfl: 12   cetirizine  (ZYRTEC ) 10 MG tablet, Take 1 tablet (10 mg total) by mouth daily., Disp: 30 tablet, Rfl: 5   Continuous Glucose Receiver (DEXCOM G7 RECEIVER) DEVI, Use to check blood sugars., Disp: 1 each, Rfl: 0   Continuous Glucose Sensor (DEXCOM G7 SENSOR) MISC, Change every 10 days to check blood sugars continuously, Disp: 3 each, Rfl: 3   EPINEPHrine  0.3 MG/0.3ML SOSY, Inject 0.3 mLs (0.3 mg total) as directed as needed (use as directed for anaphalaxis reaction)., Disp: 2 each, Rfl: 1   fluticasone  (FLONASE ) 50 MCG/ACT nasal spray, Place 1 spray into both nostrils 2 (two) times daily., Disp: 16 g, Rfl: 2   fluticasone  (FLOVENT  HFA) 44 MCG/ACT inhaler, Inhale 2 puffs  into the lungs 2 (two) times daily., Disp: 31.8 g, Rfl: 12   gabapentin  (NEURONTIN ) 100 MG capsule, Take 1 capsule (100 mg total) by mouth 2 (two) times daily as needed., Disp: 90 capsule, Rfl: 1   hydrOXYzine  (ATARAX ) 25 MG tablet, Take 1 tablet (25 mg total) by mouth 2 (two) times daily. Take 2 tablet  ( 50mg  total ) by mouth at night, Disp: 60 tablet, Rfl: 0   insulin  glargine (LANTUS ) 100 UNIT/ML Solostar Pen, Inject 50 units every morning and 15 units every evening for diabetes., Disp: 60 mL, Rfl: 1   Insulin  Pen Needle (PEN NEEDLES) 31G X 6 MM MISC, Use nightly with insulin ., Disp: 100 each, Rfl: 3   mirtazapine  (REMERON ) 7.5 MG tablet, Take 1 tablet (7.5 mg total) by mouth at bedtime., Disp: 90 tablet, Rfl: 1   propranolol  ER (INDERAL  LA) 80 MG 24 hr capsule, Take 1 capsule (80 mg total) by mouth at bedtime. For headache prevention, Disp: 90 capsule, Rfl: 1   sertraline  (ZOLOFT ) 50 MG tablet, Take 1 tablet (50 mg total) by mouth daily., Disp: 90 tablet, Rfl: 1   SUMAtriptan  (IMITREX ) 50 MG tablet, Take 1 tablet by mouth at migraine onset. May repeat in 2 hours if headache persists or recurs., Disp: 10 tablet, Rfl: 0   tirzepatide  (MOUNJARO ) 7.5 MG/0.5ML Pen, Inject 7.5 mg into the skin once a week. for diabetes., Disp: 6 mL, Rfl: 0   valsartan  (DIOVAN ) 80 MG tablet, Take 1 tablet (  80 mg total) by mouth daily. for blood pressure., Disp: 90 tablet, Rfl: 3   Medications ordered in this encounter:  Meds ordered this encounter  Medications   triamcinolone  cream (KENALOG) 0.1 %    Sig: Apply 1 Application topically 2 (two) times daily.    Dispense:  45 g    Refill:  0     *If you need refills on other medications prior to your next appointment, please contact your pharmacy*  Follow-Up: Call back or seek an in-person evaluation if the symptoms worsen or if the condition fails to improve as anticipated.  [facility] [phone]  Other Instructions Continue your Zyrtec  and  Benadryl .  I have prescribed a topical steroid for you to apply to the rash areas.  If this is not enough to control your rash and help it heal, talk with your primary care provider on Monday about whether or not you can take an oral steroid like prednisone  to help get it under control   If you have been instructed to have an in-person evaluation today at a local Urgent Care facility, please use the link below. It will take you to a list of all of our available [facility], including address, phone number and hours of operation. Please do not delay care.  [facility]  If you or a family member do not have a primary care provider, use the link below to schedule a visit and establish care. When you choose a [facility] primary care physician or advanced practice provider, you gain a long-term partner in health. Find a Primary Care Provider  Learn more about [facility]'s in-office and virtual care options: [facility] - Get Care Now

## 2023-12-20 NOTE — Progress Notes (Signed)
 Virtual Visit Consent   Joan [name], you are scheduled for a virtual visit with a [facility] provider today. Just as with appointments in the office, your consent must be obtained to participate. Your consent will be active for this visit and any virtual visit you may have with one of our providers in the next 365 days. If you have a MyChart account, a copy of this consent can be sent to you electronically.  As this is a virtual visit, video technology does not allow for your provider to perform a traditional examination. This may limit your provider's ability to fully assess your condition. If your provider identifies any concerns that need to be evaluated in person or the need to arrange testing (such as labs, EKG, etc.), we will make arrangements to do so. Although advances in technology are sophisticated, we cannot ensure that it will always work on either your end or our end. If the connection with a video visit is poor, the visit may have to be switched to a telephone visit. With either a video or telephone visit, we are not always able to ensure that we have a secure connection.  By engaging in this virtual visit, you consent to the provision of healthcare and authorize for your insurance to be billed (if applicable) for the services provided during this visit. Depending on your insurance coverage, you may receive a charge related to this service.  I need to obtain your verbal consent now. Are you willing to proceed with your visit today? Joan [name] has provided verbal consent on 12/20/2023 for a virtual visit (video or telephone). Blinda Burger, NP  Date: 12/20/2023 2:24 PM   Virtual Visit via Video Note   I, Blinda Burger, connected with  Joan [name]  ([id number], 25-Jun-1981) on 12/20/23 at  2:15 PM EDT by a video-enabled telemedicine application and verified that I am speaking with the correct person using two identifiers.  Location: Patient: Virtual Visit Location Patient:  Home Provider: Virtual Visit Location Provider: Home Office   I discussed the limitations of evaluation and management by telemedicine and the availability of in person appointments. The patient expressed understanding and agreed to proceed.    History of Present Illness: Joan [name] is a 43 y.o. who identifies as a female who was assigned female at birth, and is being seen today for rash.  It is on both of her arms both of her legs and her abdomen.  It has been there for about a week.  It started on her left wrist and has spread to these other areas.  Her back is itchy but she does not know if she has any on her back or not.  She does not have any on her face.  She wonders if it could be shingles.  It both hurts and itches a lot.  She is taking Zyrtec  daily already for her allergies so she has been taking an additional Benadryl  3 times a day and also applying Benadryl  cream and spray to the rash with no relief.  The rash is little blisters.  She has not been outside and not been exposed to poison ivy or poison oak as far as she knows.  A1c in March was 7.4.  Blood sugar at this moment for her monitor is 170   HPI: HPI  Problems:  Patient Active Problem List   Diagnosis Date Noted   Preventative health care 10/29/2023   Environmental and seasonal allergies 10/03/2023  Achilles tendinitis, left leg 09/16/2023   Chronic heel pain, left 09/12/2023   Major depressive disorder, recurrent episode, moderate (HCC) 06/09/2023   Vertigo 05/29/2023   Bipolar 2 disorder, major depressive episode (HCC) 10/23/2022   Migraines 01/26/2021   GAD (generalized anxiety disorder) 10/24/2020   Asthma 11/03/2018   Recurrent sinusitis 05/20/2018   Tachycardia 05/04/2018   Uncontrolled type 2 diabetes mellitus with hyperglycemia (HCC) 11/24/2017   Essential hypertension 11/24/2017   Hyperlipidemia 11/24/2017   Major depressive disorder, recurrent, severe without psychotic features (HCC)    PTSD  (post-traumatic stress disorder) 04/11/2015    Allergies:  Allergies  Allergen Reactions   Ibuprofen Swelling and Other (See Comments)    Facial    Ciprofloxacin Hives and Rash   Metformin  And Related Other (See Comments)    Elevated Lactic Acid   Penicillins Hives, Rash and Other (See Comments)    Has patient had a PCN reaction causing immediate rash, facial/tongue/throat swelling, SOB or lightheadedness with hypotension: No Has patient had a PCN reaction causing severe rash involving mucus membranes or skin necrosis: No Has patient had a PCN reaction that required hospitalization: No Has patient had a PCN reaction occurring within the last 10 years: No If all of the above answers are "NO", then may proceed with Cephalosporin use. THE PATIENT IS ABLE TO TOLERATE CEPHALOSPORINS WITHOUT DIFFIC   Sulfa Antibiotics Hives and Rash   Other Other (See Comments)    ALL NUTS-SCRATCHY THROAT   Shellfish Allergy  Other (See Comments)    ALL SEAFOOD-SCRATCHY THROAT   Trazodone  And Nefazodone Other (See Comments)    "Causes me to hear voices"   Medications:  Current Outpatient Medications:    triamcinolone  cream (KENALOG) 0.1 %, Apply 1 Application topically 2 (two) times daily., Disp: 45 g, Rfl: 0   albuterol  (VENTOLIN  HFA) 108 (90 Base) MCG/ACT inhaler, Inhale 1-2 puffs into the lungs every 6 (six) hours as needed for wheezing or shortness of breath., Disp: 8 g, Rfl: 0   atorvastatin  (LIPITOR) 20 MG tablet, Take 1 tablet (20 mg total) by mouth daily. for cholesterol., Disp: 90 tablet, Rfl: 3   azelastine  (ASTELIN ) 0.1 % nasal spray, Place 2 sprays into both nostrils 2 (two) times daily. Use in each nostril as directed, Disp: 30 mL, Rfl: 12   cetirizine  (ZYRTEC ) 10 MG tablet, Take 1 tablet (10 mg total) by mouth daily., Disp: 30 tablet, Rfl: 5   Continuous Glucose Receiver (DEXCOM G7 RECEIVER) DEVI, Use to check blood sugars., Disp: 1 each, Rfl: 0   Continuous Glucose Sensor (DEXCOM G7 SENSOR)  MISC, Change every 10 days to check blood sugars continuously, Disp: 3 each, Rfl: 3   EPINEPHrine  0.3 MG/0.3ML SOSY, Inject 0.3 mLs (0.3 mg total) as directed as needed (use as directed for anaphalaxis reaction)., Disp: 2 each, Rfl: 1   fluticasone  (FLONASE ) 50 MCG/ACT nasal spray, Place 1 spray into both nostrils 2 (two) times daily., Disp: 16 g, Rfl: 2   fluticasone  (FLOVENT  HFA) 44 MCG/ACT inhaler, Inhale 2 puffs into the lungs 2 (two) times daily., Disp: 31.8 g, Rfl: 12   gabapentin  (NEURONTIN ) 100 MG capsule, Take 1 capsule (100 mg total) by mouth 2 (two) times daily as needed., Disp: 90 capsule, Rfl: 1   hydrOXYzine  (ATARAX ) 25 MG tablet, Take 1 tablet (25 mg total) by mouth 2 (two) times daily. Take 2 tablet  ( 50mg  total ) by mouth at night, Disp: 60 tablet, Rfl: 0   insulin  glargine (LANTUS ) 100 UNIT/ML  Solostar Pen, Inject 50 units every morning and 15 units every evening for diabetes., Disp: 60 mL, Rfl: 1   Insulin  Pen Needle (PEN NEEDLES) 31G X 6 MM MISC, Use nightly with insulin ., Disp: 100 each, Rfl: 3   mirtazapine  (REMERON ) 7.5 MG tablet, Take 1 tablet (7.5 mg total) by mouth at bedtime., Disp: 90 tablet, Rfl: 1   propranolol  ER (INDERAL  LA) 80 MG 24 hr capsule, Take 1 capsule (80 mg total) by mouth at bedtime. For headache prevention, Disp: 90 capsule, Rfl: 1   sertraline  (ZOLOFT ) 50 MG tablet, Take 1 tablet (50 mg total) by mouth daily., Disp: 90 tablet, Rfl: 1   SUMAtriptan  (IMITREX ) 50 MG tablet, Take 1 tablet by mouth at migraine onset. May repeat in 2 hours if headache persists or recurs., Disp: 10 tablet, Rfl: 0   tirzepatide  (MOUNJARO ) 7.5 MG/0.5ML Pen, Inject 7.5 mg into the skin once a week. for diabetes., Disp: 6 mL, Rfl: 0   valsartan  (DIOVAN ) 80 MG tablet, Take 1 tablet (80 mg total) by mouth daily. for blood pressure., Disp: 90 tablet, Rfl: 3  Observations/Objective: Patient is well-developed, well-nourished in no acute distress.  Resting comfortably  at home.  Head is  normocephalic, atraumatic.  No labored breathing.  Speech is clear and coherent with logical content.  Patient is alert and oriented at baseline.  Has an excoriated vesicular papular rash that she can show me on video on her left wrist.  Assessment and Plan: 1. Contact dermatitis, unspecified contact dermatitis type, unspecified trigger (Primary)  Not sure what she is coming contact with that has triggered this since she does not think she is coming contact with poison ivy or poison oak.  Follow Up Instructions: I discussed the assessment and treatment plan with the patient. The patient was provided an opportunity to ask questions and all were answered. The patient agreed with the plan and demonstrated an understanding of the instructions.  A copy of instructions were sent to the patient via MyChart unless otherwise noted below.   The patient was advised to call back or seek an in-person evaluation if the symptoms worsen or if the condition fails to improve as anticipated.    Blinda Burger, NP

## 2023-12-22 ENCOUNTER — Ambulatory Visit: Payer: Self-pay

## 2023-12-22 ENCOUNTER — Ambulatory Visit (HOSPITAL_COMMUNITY): Payer: MEDICAID | Admitting: Licensed Clinical Social Worker

## 2023-12-22 ENCOUNTER — Ambulatory Visit
Admission: EM | Admit: 2023-12-22 | Discharge: 2023-12-22 | Disposition: A | Discharge status: Home / Self Care | Payer: MEDICAID | Attending: Internal Medicine | Admitting: Internal Medicine

## 2023-12-22 DIAGNOSIS — L237 Allergic contact dermatitis due to plants, except food: Secondary | ICD-10-CM

## 2023-12-22 DIAGNOSIS — J069 Acute upper respiratory infection, unspecified: Secondary | ICD-10-CM

## 2023-12-22 LAB — POC SARS CORONAVIRUS 2 AG -  ED: SARS Coronavirus 2 Ag: NEGATIVE

## 2023-12-22 MED ORDER — PROMETHAZINE-DM 6.25-15 MG/5ML PO SYRP: 5.0000 mL | ORAL_SOLUTION | Freq: Every evening | ORAL | 0 refills | Status: DC | PRN

## 2023-12-22 MED ORDER — PREDNISONE 20 MG PO TABS: 40.0000 mg | ORAL_TABLET | Freq: Every day | ORAL | 0 refills | Status: AC

## 2023-12-22 NOTE — Telephone Encounter (Signed)
  Chief Complaint: Rash - burning and itching, also URI s/s Symptoms: above Frequency: URI s/s today, rash since last week Pertinent Negatives: Patient denies  Disposition: [] ED /[x] Urgent Care (no appt availability in office) / [] Appointment(In office/virtual)/ []  [place] [facility] [facility]/ [] Home Care/ [] Refused Recommended Disposition /[] [facility]/ []  Follow-up with PCP Additional Notes: Pt was seen via VV 5/17 for rash. Rash continues on arms abdomen and legs. Pt is concerned that this may be shingles. In addition pt is experiencing URI s/s. No appts available in office - pt will go to UC for care.    Copied from CRM [id number]. Topic: Clinical - Red Word Triage >> Dec 22, 2023  8:44 AM Alyse July wrote: Red Word that prompted transfer to Nurse Triage: Allergic reaction to poison oak. Reason for Disposition  MODERATE to SEVERE itching (e.g., interferes with work, school, sleep, or other activities)  Answer Assessment - Initial Assessment Questions 1. APPEARANCE of RASH: "Describe the rash."      As from VV 5/17  Protocols used: Poison Ivy - Oak - [facility]

## 2023-12-22 NOTE — Telephone Encounter (Signed)
Noted agree with in person evaluation.

## 2023-12-22 NOTE — Discharge Instructions (Addendum)
 Take prednisone  once daily for the next 5 days. This will be 2 pills (20mg  each) totaling 40mg  daily for 5 days each morning with breakfast.  Purchase calamine lotion over the counter and apply as needed to the forearms to dry out rash.  Avoid scratching rash.  Watch for signs of secondary bacterial infection as discussed such as new/worsening redness, swelling, pus, pain, or fever/chills.    You have a viral illness which will improve on its own with rest, fluids, and medications to help with your symptoms.  Tylenol , guaifenesin  (plain mucinex ), and saline nasal sprays may help relieve symptoms.  Promethazine  DM at bedtime as needed for cough.  This medicine will make you drowsy so only take at bedtime.  Two teaspoons of honey in 1 cup of warm water every 4-6 hours may help with throat pains.  Humidifier in room at nighttime may help soothe cough (clean well daily).   For chest pain, shortness of breath, inability to keep food or fluids down without vomiting, fever that does not respond to tylenol  or motrin, or any other severe symptoms, please go to the ER for further evaluation. Return to urgent care as needed, otherwise follow-up with PCP.

## 2023-12-22 NOTE — ED Provider Notes (Addendum)
 [name]    CSN: [id number] Arrival date & time: 12/22/23  1014      History   Chief Complaint Chief Complaint  Patient presents with   Cough    HPI LEILI ESKENAZI is a 43 y.o. female.   ALEXXA SABET is a 43 y.o. female presenting for chief complaint of Cough, congestion, headache, and generalized fatigue that started yesterday.  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  Cough is mostly dry and nonproductive.  History of asthma, states she has not needed to use her albuterol  inhaler and denies shortness of breath, chest pain, heart palpitations, nausea, vomiting, diarrhea, abdominal pain, and fever/chills.  Denies recent antibiotic or steroid use.  She is taking over-the-counter medications with some relief.  She had COVID-19 in January 2025 (4 months ago) and states this feels similar.  Additionally reports itchy rash to the left wrist/left hand started 1 week ago.  Rash has now spread to the left forearm, right forearm/hand, abdomen, and right thigh.  Rash consists of multiple red and itchy bumps without any drainage.  She describes the rash as a "burning pain" that is intensely pruritic.  She has been using topical steroid cream as prescribed by e-visit for the last 3 days without relief of rash.  History of diabetes, states her last hemoglobin A1c was 7.4 and sugars have been well-controlled.     Past Medical History:  Diagnosis Date   Acute asthma exacerbation 04/10/2018   Acute bacterial conjunctivitis of right eye 05/01/2023   Acute non-recurrent maxillary sinusitis 09/25/2018   Asthma    Asthma exacerbation 08/03/2020   Auditory hallucinations    only after anesthesia   BV (bacterial vaginosis) 02/13/2018   COVID-19 virus infection 08/16/2019   Diabetes mellitus without complication (HCC)    diet controlled   Diabetes mellitus, type II (HCC)    insulin , jardiance    Dyspnea    with exertion    Essential hypertension    Essential hypertension 11/24/2017   GERD (gastroesophageal reflux disease)    occasionally-NO MEDS   History of placement of ear tubes 05/20/2018   Hyperlipidemia    Kidney stone 02/11/2019   Localized skin mass, lump, or swelling 01/26/2021   MDD (major depressive disorder)    Miscarriage    Nausea & vomiting 10/05/2019   PTSD (post-traumatic stress disorder)    Right lower quadrant abdominal pain 02/13/2018   Tachycardia     Patient Active Problem List   Diagnosis Date Noted   Preventative health care 10/29/2023   Environmental and seasonal allergies 10/03/2023   Achilles tendinitis, left leg 09/16/2023   Chronic heel pain, left 09/12/2023   Major depressive disorder, recurrent episode, moderate (HCC) 06/09/2023   Vertigo 05/29/2023   Bipolar 2 disorder, major depressive episode (HCC) 10/23/2022   Migraines 01/26/2021   GAD (generalized anxiety disorder) 10/24/2020   Asthma 11/03/2018   Recurrent sinusitis 05/20/2018   Tachycardia 05/04/2018   Uncontrolled type 2 diabetes mellitus with hyperglycemia (HCC) 11/24/2017   Essential hypertension 11/24/2017   Hyperlipidemia 11/24/2017   Major depressive disorder, recurrent, severe without psychotic features (HCC)    PTSD (post-traumatic stress disorder) 04/11/2015    Past Surgical History:  Procedure Laterality Date   ABDOMINAL HYSTERECTOMY     ADENOIDECTOMY     CHOLECYSTECTOMY  2009   CYSTOSCOPY N/A 07/31/2018   Procedure: CYSTOSCOPY;  Surgeon: Prescilla Brod, MD;  Location: [facility];  Service: Gynecology;  Laterality: N/A;   LAPAROSCOPIC BILATERAL SALPINGECTOMY Bilateral 07/31/2018   Procedure: LAPAROSCOPIC BILATERAL SALPINGECTOMY;  Surgeon: Prescilla Brod, MD;  Location: [facility];  Service: Gynecology;  Laterality: Bilateral;   LAPAROSCOPIC HYSTERECTOMY N/A 07/31/2018   Procedure: HYSTERECTOMY TOTAL LAPAROSCOPIC;  Surgeon: Prescilla Brod, MD;  Location: [facility];  Service: Gynecology;   Laterality: N/A;   LAPAROSCOPY N/A 06/07/2019   Procedure: LAPAROSCOPY OPERATIVE, WITH PERITONEAL BIOPSIES;  Surgeon: Prescilla Brod, MD;  Location: [facility];  Service: Gynecology;  Laterality: N/A;   LYSIS OF ADHESION N/A 07/31/2018   Procedure: LYSIS OF ADHESION;  Surgeon: Prescilla Brod, MD;  Location: [facility];  Service: Gynecology;  Laterality: N/A;   OVARY SURGERY Right    cyst removed a while ago   TONSILLECTOMY     tubes in ear      OB History   No obstetric history on file.      Home Medications    Prior to Admission medications   Medication Sig Start Date End Date Taking? Authorizing Provider  predniSONE  (DELTASONE ) 20 MG tablet Take 2 tablets (40 mg total) by mouth daily with breakfast for 5 days. 12/22/23 12/27/23 Yes StanhopeDanny Dye, FNP  promethazine -dextromethorphan (PROMETHAZINE -DM) 6.25-15 MG/5ML syrup Take 5 mLs by mouth at bedtime as needed for cough. 12/22/23  Yes Starlene Eaton, FNP  albuterol  (VENTOLIN  HFA) 108 (90 Base) MCG/ACT inhaler Inhale 1-2 puffs into the lungs every 6 (six) hours as needed for wheezing or shortness of breath. 08/27/23   Starlene Eaton, FNP  atorvastatin  (LIPITOR) 20 MG tablet Take 1 tablet (20 mg total) by mouth daily. for cholesterol. 10/30/23   Clark, Katherine K, NP  azelastine  (ASTELIN ) 0.1 % nasal spray Place 2 sprays into both nostrils 2 (two) times daily. Use in each nostril as directed 12/08/23   Orelia Binet, MD  cetirizine  (ZYRTEC ) 10 MG tablet Take 1 tablet (10 mg total) by mouth daily. 12/08/23   Orelia Binet, MD  Continuous Glucose Receiver (DEXCOM G7 RECEIVER) DEVI Use to check blood sugars. 05/02/23   Gabriel John, NP  Continuous Glucose Sensor (DEXCOM G7 SENSOR) MISC Change every 10 days to check blood sugars continuously 08/20/23   Gabriel John, NP  EPINEPHrine  0.3 MG/0.3ML SOSY Inject 0.3 mLs (0.3 mg total) as directed as needed (use as directed for anaphalaxis reaction). 12/09/23   Orelia Binet, MD  fluticasone  (FLONASE ) 50 MCG/ACT nasal spray Place 1 spray into both nostrils 2 (two) times daily. 12/08/23   Orelia Binet, MD  fluticasone  (FLOVENT  HFA) 44 MCG/ACT inhaler Inhale 2 puffs into the lungs 2 (two) times daily. 12/08/23   Orelia Binet, MD  gabapentin  (NEURONTIN ) 100 MG capsule Take 1 capsule (100 mg total) by mouth 2 (two) times daily as needed. 12/09/23 03/08/24  Levester Reagin, NP  hydrOXYzine  (ATARAX ) 25 MG tablet Take 1 tablet (25 mg total) by mouth 2 (two) times daily. Take 2 tablet  ( 50mg  total ) by mouth at night 05/28/23   Levester Reagin, NP  insulin  glargine (LANTUS ) 100 UNIT/ML Solostar Pen Inject 50 units every morning and 15 units every evening for diabetes. 10/30/23   Clark, Katherine K, NP  Insulin  Pen Needle (PEN NEEDLES) 31G X 6 MM MISC Use nightly with insulin . 05/01/23   Clark, Katherine K, NP  mirtazapine  (REMERON ) 7.5 MG tablet Take 1 tablet (7.5 mg total) by mouth at bedtime. 08/18/23   Levester Reagin, NP  propranolol  ER (INDERAL  LA) 80 MG 24  hr capsule Take 1 capsule (80 mg total) by mouth at bedtime. For headache prevention 05/01/23   Clark, Katherine K, NP  sertraline  (ZOLOFT ) 50 MG tablet Take 1 tablet (50 mg total) by mouth daily. 08/18/23 08/17/24  Levester Reagin, NP  SUMAtriptan  (IMITREX ) 50 MG tablet Take 1 tablet by mouth at migraine onset. May repeat in 2 hours if headache persists or recurs. 05/01/23   Clark, Katherine K, NP  tirzepatide  (MOUNJARO ) 7.5 MG/0.5ML Pen Inject 7.5 mg into the skin once a week. for diabetes. 12/18/23   Clark, Katherine K, NP  triamcinolone  cream (KENALOG ) 0.1 % Apply 1 Application topically 2 (two) times daily. 12/20/23   Blinda Burger, NP  valsartan  (DIOVAN ) 80 MG tablet Take 1 tablet (80 mg total) by mouth daily. for blood pressure. 10/30/23   Gabriel John, NP    Family History Family History  Problem Relation Age of Onset   Asthma Mother    Diabetes Mother    Hyperlipidemia Mother    Hypertension Mother     Diabetes Father    Hyperlipidemia Father    Hypertension Father    Diabetes Brother    Depression Brother    Alcohol abuse Brother    Depression Maternal Grandmother    Stroke Paternal Grandmother    Breast cancer Neg Hx     Social History Social History   Tobacco Use   Smoking status: Never    Passive exposure: Never   Smokeless tobacco: Never  Vaping Use   Vaping status: Never Used  Substance Use Topics   Alcohol use: No   Drug use: No     Allergies   Ibuprofen, Ciprofloxacin, Metformin  and related, Penicillins, Sulfa antibiotics, Other, Shellfish allergy , and Trazodone  and nefazodone   Review of Systems Review of Systems Per HPI  Physical Exam Triage Vital Signs ED Triage Vitals  Encounter Vitals Group     BP 12/22/23 1018 115/78     Systolic BP Percentile --      Diastolic BP Percentile --      Pulse Rate 12/22/23 1018 87     Resp 12/22/23 1018 17     Temp 12/22/23 1018 97.7 F (36.5 C)     Temp Source 12/22/23 1018 Oral     SpO2 12/22/23 1018 98 %     Weight --      Height --      Head Circumference --      Peak Flow --      Pain Score 12/22/23 1022 9     Pain Loc --      Pain Education --      Exclude from Growth Chart --    No data found.  Updated Vital Signs BP 115/78 (BP Location: Right Arm)   Pulse 87   Temp 97.7 F (36.5 C) (Oral)   Resp 17   LMP 07/24/2018 (Exact Date) Comment: surgery 07/31/2018  SpO2 98%   Visual Acuity Right Eye Distance:   Left Eye Distance:   Bilateral Distance:    Right Eye Near:   Left Eye Near:    Bilateral Near:     Physical Exam Vitals and nursing note reviewed.  Constitutional:      Appearance: She is not ill-appearing or toxic-appearing.  HENT:     Head: Normocephalic and atraumatic.     Right Ear: Hearing, tympanic membrane, ear canal and external ear normal.     Left Ear: Hearing, tympanic membrane, ear canal and external ear  normal.     Nose: Congestion present.     Mouth/Throat:      Lips: Pink.     Mouth: Mucous membranes are moist. No injury or oral lesions.     Dentition: Normal dentition.     Tongue: No lesions.     Pharynx: Oropharynx is clear. Uvula midline. No pharyngeal swelling, oropharyngeal exudate, posterior oropharyngeal erythema, uvula swelling or postnasal drip.     Tonsils: No tonsillar exudate.  Eyes:     General: Lids are normal. Vision grossly intact. Gaze aligned appropriately.     Extraocular Movements: Extraocular movements intact.     Conjunctiva/sclera: Conjunctivae normal.  Neck:     Trachea: Trachea and phonation normal.  Cardiovascular:     Rate and Rhythm: Normal rate and regular rhythm.     Heart sounds: Normal heart sounds, S1 normal and S2 normal.  Pulmonary:     Effort: Pulmonary effort is normal. No respiratory distress.     Breath sounds: Normal breath sounds and air entry.  Musculoskeletal:     Cervical back: Neck supple.  Lymphadenopathy:     Cervical: No cervical adenopathy.  Skin:    General: Skin is warm and dry.     Capillary Refill: Capillary refill takes less than 2 seconds.     Findings: Rash (Erythematous intensely pruritic maculopapular rash to the bilateral forearms, abdomen, and right upper thigh.  No lesions to the face or back.) present.  Neurological:     General: No focal deficit present.     Mental Status: She is alert and oriented to person, place, and time. Mental status is at baseline.     Cranial Nerves: No dysarthria or facial asymmetry.  Psychiatric:        Mood and Affect: Mood normal.        Speech: Speech normal.        Behavior: Behavior normal.        Thought Content: Thought content normal.        Judgment: Judgment normal.   Abdomen  Left arm  Right arm    UC Treatments / Results  Labs (all labs ordered are listed, but only abnormal results are displayed) Labs Reviewed  POC SARS CORONAVIRUS 2 AG -  ED    EKG   Radiology No results found.  Procedures Procedures (including  critical care time)  Medications Ordered in UC Medications - No data to display  Initial Impression / Assessment and Plan / UC Course  I have reviewed the triage vital signs and the nursing notes.  Pertinent labs & imaging results that were available during my care of the patient were reviewed by me and considered in my medical decision making (see chart for details).   1.  Allergic contact dermatitis due to plants except food Presentation is consistent with contact dermatitis likely due to poison ivy. Low suspicion for herpes zoster given bilateral nature of rash. Will treat with prednisone  40 mg once daily for 5 days. Advised to take with food to avoid stomach upset. May use calamine lotion over-the-counter and hydrocortisone 1% over-the-counter as needed for itching. No signs of secondary bacterial infection on exam currently, however infection return precautions discussed. Last hemoglobin A1c was 7.4 (October 29, 2023). She is aware prednisone  will likely increase her blood sugar temporarily, advised to watch diet and return to urgent care if she notices signs or symptoms of hyperglycemia.  2. Viral URI with cough Suspect viral URI, viral syndrome.  Strep/viral testing: POC  COVID testing is negative. No current signs of asthma exacerbation.   Physical exam findings reassuring, vital signs hemodynamically stable, and lungs clear, therefore deferred imaging of the chest.  Advised supportive care/prescriptions for symptomatic relief as outlined in AVS.    Counseled patient on potential for adverse effects with medications prescribed/recommended today, strict ER and return-to-clinic precautions discussed, patient verbalized understanding.    Final Clinical Impressions(s) / UC Diagnoses   Final diagnoses:  Allergic contact dermatitis due to plants, except food  Viral URI with cough     Discharge Instructions      Take prednisone  once daily for the next 5 days. This will be 2  pills (20mg  each) totaling 40mg  daily for 5 days each morning with breakfast.  Purchase calamine lotion over the counter and apply as needed to the forearms to dry out rash.  Avoid scratching rash.  Watch for signs of secondary bacterial infection as discussed such as new/worsening redness, swelling, pus, pain, or fever/chills.    You have a viral illness which will improve on its own with rest, fluids, and medications to help with your symptoms.  Tylenol , guaifenesin  (plain mucinex ), and saline nasal sprays may help relieve symptoms.  Promethazine  DM at bedtime as needed for cough.  This medicine will make you drowsy so only take at bedtime.  Two teaspoons of honey in 1 cup of warm water every 4-6 hours may help with throat pains.  Humidifier in room at nighttime may help soothe cough (clean well daily).   For chest pain, shortness of breath, inability to keep food or fluids down without vomiting, fever that does not respond to tylenol  or motrin, or any other severe symptoms, please go to the ER for further evaluation. Return to urgent care as needed, otherwise follow-up with PCP.     ED Prescriptions     Medication Sig Dispense Auth. Provider   predniSONE  (DELTASONE ) 20 MG tablet Take 2 tablets (40 mg total) by mouth daily with breakfast for 5 days. 10 tablet Starlene Eaton, FNP   promethazine -dextromethorphan (PROMETHAZINE -DM) 6.25-15 MG/5ML syrup Take 5 mLs by mouth at bedtime as needed for cough. 118 mL Starlene Eaton, FNP      PDMP not reviewed this encounter.   Starlene Eaton, FNP 12/22/23 1044    Starlene Eaton, [place] 12/22/23 1046

## 2023-12-22 NOTE — ED Triage Notes (Signed)
 Pt present with rash on arms and abdomin for 1 week. Bumps are very painful and itchy. She has been using benadryl  and zyrtec .  She also c/o cough, headache, congestion, and fatigue for 1 day.

## 2023-12-23 ENCOUNTER — Other Ambulatory Visit: Payer: Self-pay

## 2023-12-23 DIAGNOSIS — E1165 Type 2 diabetes mellitus with hyperglycemia: Secondary | ICD-10-CM

## 2023-12-23 MED ORDER — DEXCOM G7 SENSOR MISC
3 refills | Status: DC
Start: 2023-12-23 — End: 2024-04-15

## 2023-12-24 ENCOUNTER — Telehealth: Payer: Self-pay

## 2023-12-24 ENCOUNTER — Other Ambulatory Visit (HOSPITAL_COMMUNITY): Payer: Self-pay

## 2023-12-24 NOTE — Telephone Encounter (Signed)
 Pharmacy Patient Advocate Encounter   Received notification from Patient Pharmacy that prior authorization for Dexcom G7 sensors is required/requested.   Insurance verification completed.   The patient is insured through [employer] .   Per test claim: PA required; PA submitted to above mentioned insurance via CoverMyMeds Key/confirmation #/EOC [id number] Status is pending

## 2023-12-24 NOTE — Telephone Encounter (Signed)
 Pharmacy Patient Advocate Encounter  Received notification from [employer] that Prior Authorization for Dexcom G7 sensors has been DENIED.  Full denial letter will be uploaded to the media tab. See denial reason below.   PA #/Case ID/Reference #: [id number]

## 2023-12-24 NOTE — Telephone Encounter (Signed)
 Kelli, can we make sure she gets her Dexcom approved?

## 2023-12-24 NOTE — Telephone Encounter (Signed)
See mychart message encounter

## 2023-12-24 NOTE — Telephone Encounter (Signed)
 Please resubmit this PA. In the denial it states that patient needs to have face to face OV last 3 mo and using the Dexcom as prescribed. Patient has had face to face OV on 10/29/23 and is using the Dexcom as prescribed.

## 2023-12-25 ENCOUNTER — Other Ambulatory Visit (HOSPITAL_COMMUNITY): Payer: Self-pay

## 2023-12-25 NOTE — Telephone Encounter (Signed)
 Pharmacy Patient Advocate Encounter   Received notification from Physician's Office that prior authorization for Dexcom G7 sensors is required/requested.   Insurance verification completed.   The patient is insured through [employer] [facility] .   Per test claim: PA required; PA submitted to above mentioned insurance via CoverMyMeds Key/confirmation #/EOC [facility] Status is pending

## 2023-12-26 ENCOUNTER — Other Ambulatory Visit (HOSPITAL_COMMUNITY): Payer: Self-pay

## 2023-12-26 NOTE — Telephone Encounter (Signed)
 Pharmacy Patient Advocate Encounter  Received notification from [facility] that Prior Authorization for Dexcom G7 sensors has been APPROVED from 12/25/23 to 12/24/24. Unable to obtain price due to refill too soon rejection, last fill date 12/26/23 next available fill date5/28/25   PA #/Case ID/Reference #: [id number]

## 2023-12-26 NOTE — Telephone Encounter (Signed)
Pt notified through MyChart message.

## 2024-01-05 NOTE — Telephone Encounter (Signed)
 PA has approved in separate encounter, signing off on duplicate encounter.

## 2024-01-15 ENCOUNTER — Encounter: Payer: Self-pay | Admitting: Emergency Medicine

## 2024-01-15 ENCOUNTER — Ambulatory Visit
Admission: EM | Admit: 2024-01-15 | Discharge: 2024-01-15 | Disposition: A | Discharge status: Home / Self Care | Payer: MEDICAID | Attending: Internal Medicine | Admitting: Internal Medicine

## 2024-01-15 DIAGNOSIS — J069 Acute upper respiratory infection, unspecified: Secondary | ICD-10-CM

## 2024-01-15 LAB — POC COVID19/FLU A&B COMBO
Covid Antigen, POC: NEGATIVE
Influenza A Antigen, POC: NEGATIVE
Influenza B Antigen, POC: NEGATIVE

## 2024-01-15 NOTE — ED Provider Notes (Signed)
 [name]    CSN: [id number] Arrival date & time: 01/15/24  2542      History   Chief Complaint Chief Complaint  Patient presents with   Sore Throat   Cough   Otalgia   Headache    HPI Joan [name] is a 43 y.o. female.   Joan [name] is a 43 y.o. female presenting for chief complaint of Sore Throat, Cough, Otalgia, and Headache that started yesterday.  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  Cough is dry and nonproductive.  Sore throat is mild and worsened by swallowing/coughing.  Denies difficulty maintaining secretions, dizziness, fever, chills, nausea, vomiting, diarrhea, abdominal pain, and rash.  History of asthma, she has not needed to use her albuterol  inhaler during this illness.  Denies recent antibiotic use.  Denies chest pain, shortness of breath, heart palpitations, and leg swelling/orthopnea.  She has not attempted use of any over-the-counter medications prior to arrival for symptoms.   Sore Throat Associated symptoms include headaches.  Cough Associated symptoms: ear pain and headaches   Otalgia Associated symptoms: cough and headaches   Headache Associated symptoms: cough and ear pain     Past Medical History:  Diagnosis Date   Acute asthma exacerbation 04/10/2018   Acute bacterial conjunctivitis of right eye 05/01/2023   Acute non-recurrent maxillary sinusitis 09/25/2018   Asthma    Asthma exacerbation 08/03/2020   Auditory hallucinations    only after anesthesia   BV (bacterial vaginosis) 02/13/2018   COVID-19 virus infection 08/16/2019   Diabetes mellitus without complication (HCC)    diet controlled   Diabetes mellitus, type II (HCC)    insulin , jardiance    Dyspnea    with exertion   Essential hypertension    Essential hypertension 11/24/2017   GERD (gastroesophageal reflux disease)    occasionally-NO MEDS   History of placement of ear tubes 05/20/2018    Hyperlipidemia    Kidney stone 02/11/2019   Localized skin mass, lump, or swelling 01/26/2021   MDD (major depressive disorder)    Miscarriage    Nausea & vomiting 10/05/2019   PTSD (post-traumatic stress disorder)    Right lower quadrant abdominal pain 02/13/2018   Tachycardia     Patient Active Problem List   Diagnosis Date Noted   Preventative health care 10/29/2023   Environmental and seasonal allergies 10/03/2023   Achilles tendinitis, left leg 09/16/2023   Chronic heel pain, left 09/12/2023   Major depressive disorder, recurrent episode, moderate (HCC) 06/09/2023   Vertigo 05/29/2023   Bipolar 2 disorder, major depressive episode (HCC) 10/23/2022   Migraines 01/26/2021   GAD (generalized anxiety disorder) 10/24/2020   Asthma 11/03/2018   Recurrent sinusitis 05/20/2018   Tachycardia 05/04/2018   Uncontrolled type 2 diabetes mellitus with hyperglycemia (HCC) 11/24/2017   Essential hypertension 11/24/2017   Hyperlipidemia 11/24/2017   Major depressive disorder, recurrent, severe without psychotic features (HCC)    PTSD (post-traumatic stress disorder) 04/11/2015    Past Surgical History:  Procedure Laterality Date   ABDOMINAL HYSTERECTOMY     ADENOIDECTOMY     CHOLECYSTECTOMY  2009   CYSTOSCOPY N/A 07/31/2018   Procedure: CYSTOSCOPY;  Surgeon: Prescilla Brod, MD;  Location: [facility];  Service: Gynecology;  Laterality: N/A;   LAPAROSCOPIC BILATERAL SALPINGECTOMY Bilateral 07/31/2018   Procedure: LAPAROSCOPIC BILATERAL SALPINGECTOMY;  Surgeon: Prescilla Brod, MD;  Location: [facility];  Service: Gynecology;  Laterality: Bilateral;   LAPAROSCOPIC HYSTERECTOMY N/A 07/31/2018  Procedure: HYSTERECTOMY TOTAL LAPAROSCOPIC;  Surgeon: Prescilla Brod, MD;  Location: [facility];  Service: Gynecology;  Laterality: N/A;   LAPAROSCOPY N/A 06/07/2019   Procedure: LAPAROSCOPY OPERATIVE, WITH PERITONEAL BIOPSIES;  Surgeon: Prescilla Brod, MD;  Location: [facility];  Service:  Gynecology;  Laterality: N/A;   LYSIS OF ADHESION N/A 07/31/2018   Procedure: LYSIS OF ADHESION;  Surgeon: Prescilla Brod, MD;  Location: [facility];  Service: Gynecology;  Laterality: N/A;   OVARY SURGERY Right    cyst removed a while ago   TONSILLECTOMY     tubes in ear      OB History   No obstetric history on file.      Home Medications    Prior to Admission medications   Medication Sig Start Date End Date Taking? Authorizing Provider  albuterol  (VENTOLIN  HFA) 108 (90 Base) MCG/ACT inhaler Inhale 1-2 puffs into the lungs every 6 (six) hours as needed for wheezing or shortness of breath. 08/27/23   Starlene Eaton, FNP  atorvastatin  (LIPITOR) 20 MG tablet Take 1 tablet (20 mg total) by mouth daily. for cholesterol. 10/30/23   Clark, Katherine K, NP  azelastine  (ASTELIN ) 0.1 % nasal spray Place 2 sprays into both nostrils 2 (two) times daily. Use in each nostril as directed 12/08/23   Orelia Binet, MD  cetirizine  (ZYRTEC ) 10 MG tablet Take 1 tablet (10 mg total) by mouth daily. 12/08/23   Orelia Binet, MD  Continuous Glucose Receiver (DEXCOM G7 RECEIVER) DEVI Use to check blood sugars. 05/02/23   Gabriel John, NP  Continuous Glucose Sensor (DEXCOM G7 SENSOR) MISC Change every 10 days to check blood sugars continuously 12/23/23   Gabriel John, NP  EPINEPHrine  0.3 MG/0.3ML SOSY Inject 0.3 mLs (0.3 mg total) as directed as needed (use as directed for anaphalaxis reaction). 12/09/23   Orelia Binet, MD  fluticasone  (FLONASE ) 50 MCG/ACT nasal spray Place 1 spray into both nostrils 2 (two) times daily. 12/08/23   Orelia Binet, MD  fluticasone  (FLOVENT  HFA) 44 MCG/ACT inhaler Inhale 2 puffs into the lungs 2 (two) times daily. 12/08/23   Orelia Binet, MD  gabapentin  (NEURONTIN ) 100 MG capsule Take 1 capsule (100 mg total) by mouth 2 (two) times daily as needed. 12/09/23 03/08/24  Levester Reagin, NP  hydrOXYzine  (ATARAX ) 25 MG tablet Take 1 tablet (25 mg total) by mouth 2  (two) times daily. Take 2 tablet  ( 50mg  total ) by mouth at night 05/28/23   Levester Reagin, NP  insulin  glargine (LANTUS ) 100 UNIT/ML Solostar Pen Inject 50 units every morning and 15 units every evening for diabetes. 10/30/23   Clark, Katherine K, NP  Insulin  Pen Needle (PEN NEEDLES) 31G X 6 MM MISC Use nightly with insulin . 05/01/23   Clark, Katherine K, NP  mirtazapine  (REMERON ) 7.5 MG tablet Take 1 tablet (7.5 mg total) by mouth at bedtime. 08/18/23   Levester Reagin, NP  promethazine -dextromethorphan (PROMETHAZINE -DM) 6.25-15 MG/5ML syrup Take 5 mLs by mouth at bedtime as needed for cough. 12/22/23   Starlene Eaton, FNP  propranolol  ER (INDERAL  LA) 80 MG 24 hr capsule Take 1 capsule (80 mg total) by mouth at bedtime. For headache prevention 05/01/23   Clark, Katherine K, NP  sertraline  (ZOLOFT ) 50 MG tablet Take 1 tablet (50 mg total) by mouth daily. 08/18/23 08/17/24  Levester Reagin, NP  SUMAtriptan  (IMITREX ) 50 MG tablet Take 1 tablet by mouth at migraine onset. May repeat in 2 hours if headache persists or recurs.  05/01/23   Clark, Katherine K, NP  tirzepatide  (MOUNJARO ) 7.5 MG/0.5ML Pen Inject 7.5 mg into the skin once a week. for diabetes. 12/18/23   Clark, Katherine K, NP  triamcinolone  cream (KENALOG ) 0.1 % Apply 1 Application topically 2 (two) times daily. 12/20/23   Blinda Burger, NP  valsartan  (DIOVAN ) 80 MG tablet Take 1 tablet (80 mg total) by mouth daily. for blood pressure. 10/30/23   Gabriel John, NP    Family History Family History  Problem Relation Age of Onset   Asthma Mother    Diabetes Mother    Hyperlipidemia Mother    Hypertension Mother    Diabetes Father    Hyperlipidemia Father    Hypertension Father    Diabetes Brother    Depression Brother    Alcohol abuse Brother    Depression Maternal Grandmother    Stroke Paternal Grandmother    Breast cancer Neg Hx     Social History Social History   Tobacco Use   Smoking status: Never    Passive  exposure: Never   Smokeless tobacco: Never  Vaping Use   Vaping status: Never Used  Substance Use Topics   Alcohol use: No   Drug use: No     Allergies   Ibuprofen, Ciprofloxacin, Metformin  and related, Penicillins, Sulfa antibiotics, Other, Shellfish allergy , and Trazodone  and nefazodone   Review of Systems Review of Systems  HENT:  Positive for ear pain.   Respiratory:  Positive for cough.   Neurological:  Positive for headaches.  Per HPI   Physical Exam Triage Vital Signs ED Triage Vitals  Encounter Vitals Group     BP 01/15/24 0847 116/88     Girls Systolic BP Percentile --      Girls Diastolic BP Percentile --      Boys Systolic BP Percentile --      Boys Diastolic BP Percentile --      Pulse Rate 01/15/24 0847 88     Resp 01/15/24 0847 20     Temp 01/15/24 0847 97.9 F (36.6 C)     Temp Source 01/15/24 0847 Oral     SpO2 01/15/24 0847 96 %     Weight --      Height --      Head Circumference --      Peak Flow --      Pain Score 01/15/24 0851 4     Pain Loc --      Pain Education --      Exclude from Growth Chart --    No data found.  Updated Vital Signs BP 116/88 (BP Location: Right Arm)   Pulse 88   Temp 97.9 F (36.6 C) (Oral)   Resp 20   LMP 07/24/2018 (Exact Date) Comment: surgery 07/31/2018  SpO2 96%   Visual Acuity Right Eye Distance:   Left Eye Distance:   Bilateral Distance:    Right Eye Near:   Left Eye Near:    Bilateral Near:     Physical Exam Vitals and nursing note reviewed.  Constitutional:      Appearance: She is not ill-appearing or toxic-appearing.  HENT:     Head: Normocephalic and atraumatic.     Right Ear: Hearing, tympanic membrane, ear canal and external ear normal.     Left Ear: Hearing, tympanic membrane, ear canal and external ear normal.     Nose: Congestion present.     Mouth/Throat:     Lips: Pink.  Mouth: Mucous membranes are moist. No injury or oral lesions.     Dentition: Normal dentition.      Tongue: No lesions.     Pharynx: Oropharynx is clear. Uvula midline. No pharyngeal swelling, oropharyngeal exudate, posterior oropharyngeal erythema, uvula swelling or postnasal drip.     Tonsils: No tonsillar exudate.   Eyes:     General: Lids are normal. Vision grossly intact. Gaze aligned appropriately.     Extraocular Movements: Extraocular movements intact.     Conjunctiva/sclera: Conjunctivae normal.   Neck:     Trachea: Trachea and phonation normal.   Cardiovascular:     Rate and Rhythm: Normal rate and regular rhythm.     Heart sounds: Normal heart sounds, S1 normal and S2 normal.  Pulmonary:     Effort: Pulmonary effort is normal. No respiratory distress.     Breath sounds: Normal breath sounds and air entry.   Musculoskeletal:     Cervical back: Neck supple.  Lymphadenopathy:     Cervical: No cervical adenopathy.   Skin:    General: Skin is warm and dry.     Capillary Refill: Capillary refill takes less than 2 seconds.     Findings: No rash.   Neurological:     General: No focal deficit present.     Mental Status: She is alert and oriented to person, place, and time. Mental status is at baseline.     Cranial Nerves: No dysarthria or facial asymmetry.   Psychiatric:        Mood and Affect: Mood normal.        Speech: Speech normal.        Behavior: Behavior normal.        Thought Content: Thought content normal.        Judgment: Judgment normal.      UC Treatments / Results  Labs (all labs ordered are listed, but only abnormal results are displayed) Labs Reviewed  POC COVID19/FLU A&B COMBO    EKG   Radiology No results found.  Procedures Procedures (including critical care time)  Medications Ordered in UC Medications - No data to display  Initial Impression / Assessment and Plan / UC Course  I have reviewed the triage vital signs and the nursing notes.  Pertinent labs & imaging results that were available during my care of the patient were  reviewed by me and considered in my medical decision making (see chart for details).   1.  Viral URI with cough Suspect viral URI, viral syndrome.  Strep/viral testing: POC COVID and flu are negative.  Physical exam findings reassuring, vital signs hemodynamically stable, and lungs clear, therefore deferred imaging of the chest.  Advised supportive care/prescriptions for symptomatic relief as outlined in AVS.    Counseled patient on potential for adverse effects with medications prescribed/recommended today, strict ER and return-to-clinic precautions discussed, patient verbalized understanding.    Final Clinical Impressions(s) / UC Diagnoses   Final diagnoses:  Viral URI with cough     Discharge Instructions      You have a viral illness which will improve on its own with rest, fluids, and medications to help with your symptoms.  Tylenol , guaifenesin  (plain mucinex ), and saline nasal sprays may help relieve symptoms.   Two teaspoons of honey in 1 cup of warm water every 4-6 hours may help with throat pains.  Humidifier in room at nighttime may help soothe cough (clean well daily).   For chest pain, shortness of breath, inability to  keep food or fluids down without vomiting, fever that does not respond to tylenol  or motrin, or any other severe symptoms, please go to the ER for further evaluation. Return to urgent care as needed, otherwise follow-up with PCP.     ED Prescriptions   None    PDMP not reviewed this encounter.   Shella Devoid [place], [place] 01/15/24 [phone]

## 2024-01-15 NOTE — Discharge Instructions (Signed)
 You have a viral illness which will improve on its own with rest, fluids, and medications to help with your symptoms.  Tylenol, guaifenesin (plain mucinex), and saline nasal sprays may help relieve symptoms.   Two teaspoons of honey in 1 cup of warm water every 4-6 hours may help with throat pains.  Humidifier in room at nighttime may help soothe cough (clean well daily).   For chest pain, shortness of breath, inability to keep food or fluids down without vomiting, fever that does not respond to tylenol or motrin, or any other severe symptoms, please go to the ER for further evaluation. Return to urgent care as needed, otherwise follow-up with PCP.

## 2024-01-15 NOTE — ED Triage Notes (Signed)
 Pt c/o cough, headache, sore throat, and ear pain that began today

## 2024-01-28 ENCOUNTER — Ambulatory Visit: Payer: MEDICAID | Admitting: Primary Care

## 2024-01-28 ENCOUNTER — Ambulatory Visit: Payer: Self-pay | Admitting: Primary Care

## 2024-01-28 ENCOUNTER — Encounter: Payer: Self-pay | Admitting: Primary Care

## 2024-01-28 VITALS — BP 116/78 | HR 105 | Temp 98.2°F | Ht 66.0 in | Wt 292.0 lb

## 2024-01-28 DIAGNOSIS — E1165 Type 2 diabetes mellitus with hyperglycemia: Secondary | ICD-10-CM

## 2024-01-28 DIAGNOSIS — Z794 Long term (current) use of insulin: Secondary | ICD-10-CM | POA: Diagnosis not present

## 2024-01-28 LAB — POCT GLYCOSYLATED HEMOGLOBIN (HGB A1C): Hemoglobin A1C: 6.8 % — AB (ref 4.0–5.6)

## 2024-01-28 NOTE — Assessment & Plan Note (Signed)
 Improved with A1C of 6.8!!  Continue Mounjaro  at 7.5 mg weekly for now as she finds it effective.  Consider dose increase to 10 mg in the future for increased food cravings/weight plateau. Continue Lantus  50 units daily for now.  Would like to titrate slowly in the future.  She will work on improving her diet and keep us  updated regarding glucose levels.  Follow-up in 3 months.
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
## 2024-01-28 NOTE — Patient Instructions (Signed)
 It is important that you improve your diet. Please limit carbohydrates in the form of white bread, rice, pasta, sweets, fast food, fried food, sugary drinks, etc. Increase your consumption of fresh fruits and vegetables, whole grains, lean protein.  Ensure you are consuming 64 ounces of water daily.  Please schedule a follow up visit for 3 months.  It was a pleasure to see you today!

## 2024-02-05 ENCOUNTER — Ambulatory Visit: Payer: MEDICAID | Admitting: Family

## 2024-02-09 DIAGNOSIS — E1165 Type 2 diabetes mellitus with hyperglycemia: Secondary | ICD-10-CM

## 2024-02-10 ENCOUNTER — Other Ambulatory Visit (HOSPITAL_COMMUNITY): Payer: Self-pay | Admitting: Family

## 2024-02-16 ENCOUNTER — Telehealth (HOSPITAL_COMMUNITY): Payer: MEDICAID | Admitting: Family

## 2024-02-16 DIAGNOSIS — F411 Generalized anxiety disorder: Secondary | ICD-10-CM

## 2024-02-16 DIAGNOSIS — F331 Major depressive disorder, recurrent, moderate: Secondary | ICD-10-CM

## 2024-02-16 MED ORDER — GABAPENTIN 100 MG PO CAPS: 100.0000 mg | ORAL_CAPSULE | Freq: Two times a day (BID) | ORAL | 1 refills | Status: DC | PRN

## 2024-02-16 MED ORDER — MIRTAZAPINE 7.5 MG PO TABS: 7.5000 mg | ORAL_TABLET | Freq: Every day | ORAL | 1 refills | Status: DC

## 2024-02-16 NOTE — Progress Notes (Signed)
 Virtual Visit via Video Note  I connected with Joan [name] on 02/16/24 at  4:30 PM EDT by a video enabled telemedicine application and verified that I am speaking with the correct person using two identifiers.  Location: Patient: home Provider: office   I discussed the limitations of evaluation and management by telemedicine and the availability of in person appointments. The patient expressed understanding and agreed to proceed.  I discussed the assessment and treatment plan with the patient. The patient was provided an opportunity to ask questions and all were answered. The patient agreed with the plan and demonstrated an understanding of the instructions.   The patient was advised to call back or seek an in-person evaluation if the symptoms worsen or if the condition fails to improve as anticipated.  I provided 20 minutes of non-face-to-face time during this encounter.   Staci LOISE Kerns, NP   BH MD/PA/NP OP Progress Note  02/16/2024 6:23 PM Joan [name]  MRN:  [id number]  Chief Complaint:  Medication Management   HPI: Joan [name] 43 year old female presents for medication management follow-up appointment.  She was seen and evaluated via caregility virtual assessment .  Joan [name] presents with a bright and pleasant affect.  She carries a diagnosis related to depression and anxiety.  Per previous assessment patient was initiated on a Zoloft  taper where she was previously prescribed Zoloft  100 mg daily.  She is currently been taking Zoloft  50 mg for the past 2 months.  States she is feeling a lot better and does not feel that she needs to continue with Zoloft  at this time.  States  my symptoms is more attributed to anxiety than depression.  Education provided with SSRIs for treatment with anxiety symptoms as well.  Patient states she would like to continue gabapentin  and mirtazapine  only.  She denied any side effects with medication taper to include head zaps, dizziness, nausea ,  tremors or sleep disturbance.   She reports she is feeling like she is in a better place than on previous assessments.  States she was recently followed by primary care where she learned that her A1c is a 6.8.  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  No concerns related to suicidal or homicidal ideations.  Denies auditory or visual hallucinations.  Reports a good appetite.  States she is resting well throughout the night.  No adjustments needed for nighttime medications.  States she has been doing well on gabapentin  100 mg twice daily.  Support encouragement reassurance was provided.  Patient to follow-up 3 months for medication management.  Patient reports she is seeking individual therapist with female therapist.  Patient is currently on the wait list  Joan [name].    Visit Diagnosis:    ICD-10-CM   1. Major depressive disorder, recurrent episode, moderate (HCC)  F33.1     2. GAD (generalized anxiety disorder)  F41.1       Past Psychiatric History:   Past Medical History:  Past Medical History:  Diagnosis Date   Acute asthma exacerbation 04/10/2018   Acute bacterial conjunctivitis of right eye 05/01/2023   Acute non-recurrent maxillary sinusitis 09/25/2018   Asthma    Asthma exacerbation 08/03/2020   Auditory hallucinations    only after anesthesia   BV (bacterial vaginosis) 02/13/2018   COVID-19 virus infection 08/16/2019   Diabetes mellitus without complication (HCC)    diet controlled   Diabetes mellitus, type II (HCC)  insulin , jardiance    Dyspnea    with exertion   Essential hypertension    Essential hypertension 11/24/2017   GERD (gastroesophageal reflux disease)    occasionally-NO MEDS   History of placement of ear tubes 05/20/2018   Hyperlipidemia    Kidney stone 02/11/2019   Localized skin mass, lump, or swelling 01/26/2021   MDD (major depressive disorder)    Miscarriage     Nausea & vomiting 10/05/2019   PTSD (post-traumatic stress disorder)    Right lower quadrant abdominal pain 02/13/2018   Tachycardia     Past Surgical History:  Procedure Laterality Date   ABDOMINAL HYSTERECTOMY     ADENOIDECTOMY     CHOLECYSTECTOMY  2009   CYSTOSCOPY N/A 07/31/2018   Procedure: CYSTOSCOPY;  Surgeon: Verdon Keen, MD;  Location: [facility];  Service: Gynecology;  Laterality: N/A;   LAPAROSCOPIC BILATERAL SALPINGECTOMY Bilateral 07/31/2018   Procedure: LAPAROSCOPIC BILATERAL SALPINGECTOMY;  Surgeon: Verdon Keen, MD;  Location: [facility];  Service: Gynecology;  Laterality: Bilateral;   LAPAROSCOPIC HYSTERECTOMY N/A 07/31/2018   Procedure: HYSTERECTOMY TOTAL LAPAROSCOPIC;  Surgeon: Verdon Keen, MD;  Location: [facility];  Service: Gynecology;  Laterality: N/A;   LAPAROSCOPY N/A 06/07/2019   Procedure: LAPAROSCOPY OPERATIVE, WITH PERITONEAL BIOPSIES;  Surgeon: Verdon Keen, MD;  Location: [facility];  Service: Gynecology;  Laterality: N/A;   LYSIS OF ADHESION N/A 07/31/2018   Procedure: LYSIS OF ADHESION;  Surgeon: Verdon Keen, MD;  Location: [facility];  Service: Gynecology;  Laterality: N/A;   OVARY SURGERY Right    cyst removed a while ago   TONSILLECTOMY     tubes in ear      Family Psychiatric History:   Family History:  Family History  Problem Relation Age of Onset   Asthma Mother    Diabetes Mother    Hyperlipidemia Mother    Hypertension Mother    Diabetes Father    Hyperlipidemia Father    Hypertension Father    Diabetes Brother    Depression Brother    Alcohol abuse Brother    Depression Maternal Grandmother    Stroke Paternal Grandmother    Breast cancer Neg Hx     Social History:  Social History   Socioeconomic History   Marital status: Married    Spouse name: chip   Number of children: 0   Years of education: Not on file   Highest education level: 12th grade  Occupational History   Occupation: day care worker  Tobacco Use    Smoking status: Never    Passive exposure: Never   Smokeless tobacco: Never  Vaping Use   Vaping status: Never Used  Substance and Sexual Activity   Alcohol use: No   Drug use: No   Sexual activity: Yes    Birth control/protection: Surgical  Other Topics Concern   Not on file  Social History Narrative   Not on file   Social Drivers of Health   Financial Resource Strain: Low Risk  (04/27/2018)   Overall Financial Resource Strain (CARDIA)    Difficulty of Paying Living Expenses: Not hard at all  Food Insecurity: No Food Insecurity (12/16/2022)   Hunger Vital Sign    Worried About Running Out of Food in the Last Year: Never true    Ran Out of Food in the Last Year: Never true  Transportation Needs: No Transportation Needs (12/16/2022)   PRAPARE - Administrator, Civil Service (Medical): No    Lack of Transportation (Non-Medical): No  Physical  Activity: Insufficiently Active (11/29/2022)   Exercise Vital Sign    Days of Exercise per Week: 4 days    Minutes of Exercise per Session: 30 min  Stress: Stress Concern Present (11/29/2022)   [employer] of Occupational Health - Occupational Stress Questionnaire    Feeling of Stress : Rather much  Social Connections: Socially Integrated (11/29/2022)   Social Connection and Isolation Panel    Frequency of Communication with Friends and Family: Twice a week    Frequency of Social Gatherings with Friends and Family: Twice a week    Attends Religious Services: More than 4 times per year    Active Member of [employer] or Organizations: Yes    Attends Engineer, structural: More than 4 times per year    Marital Status: Married    Allergies:  Allergies  Allergen Reactions   Ibuprofen Swelling and Other (See Comments)    Facial    Ciprofloxacin Hives and Rash   Metformin  And Related Other (See Comments)    Elevated Lactic Acid   Penicillins Hives, Rash and Other (See Comments)    Has patient had a PCN reaction  causing immediate rash, facial/tongue/throat swelling, SOB or lightheadedness with hypotension: No Has patient had a PCN reaction causing severe rash involving mucus membranes or skin necrosis: No Has patient had a PCN reaction that required hospitalization: No Has patient had a PCN reaction occurring within the last 10 years: No If all of the above answers are NO, then may proceed with Cephalosporin use. THE PATIENT IS ABLE TO TOLERATE CEPHALOSPORINS WITHOUT DIFFIC   Sulfa Antibiotics Hives and Rash   Other Other (See Comments)    ALL NUTS-SCRATCHY THROAT   Shellfish Allergy  Other (See Comments)    ALL SEAFOOD-SCRATCHY THROAT   Trazodone  And Nefazodone Other (See Comments)    Causes me to hear voices    Metabolic Disorder Labs: Lab Results  Component Value Date   HGBA1C 6.8 (A) 01/28/2024   MPG 315 10/22/2022   MPG 298 01/12/2021   Lab Results  Component Value Date   PROLACTIN 11.1 10/22/2022   PROLACTIN 14.5 05/05/2015   Lab Results  Component Value Date   CHOL 168 10/29/2023   TRIG 158.0 (H) 10/29/2023   HDL 51.50 10/29/2023   CHOLHDL 3 10/29/2023   VLDL 31.6 10/29/2023   LDLCALC 85 10/29/2023   LDLCALC 123 (H) 10/22/2022   Lab Results  Component Value Date   TSH 1.925 10/22/2022   TSH 1.211 08/03/2020    Therapeutic Level Labs: No results found for: LITHIUM No results found for: VALPROATE No results found for: CBMZ  Current Medications: Current Outpatient Medications  Medication Sig Dispense Refill   albuterol  (VENTOLIN  HFA) 108 (90 Base) MCG/ACT inhaler Inhale 1-2 puffs into the lungs every 6 (six) hours as needed for wheezing or shortness of breath. 8 g 0   atorvastatin  (LIPITOR) 20 MG tablet Take 1 tablet (20 mg total) by mouth daily. for cholesterol. 90 tablet 3   azelastine  (ASTELIN ) 0.1 % nasal spray Place 2 sprays into both nostrils 2 (two) times daily. Use in each nostril as directed 30 mL 12   cetirizine  (ZYRTEC ) 10 MG tablet Take 1 tablet  (10 mg total) by mouth daily. 30 tablet 5   Continuous Glucose Receiver (DEXCOM G7 RECEIVER) DEVI Use to check blood sugars. 1 each 0   Continuous Glucose Sensor (DEXCOM G7 SENSOR) MISC Change every 10 days to check blood sugars continuously 3 each 3   EPINEPHrine   0.3 MG/0.3ML SOSY Inject 0.3 mLs (0.3 mg total) as directed as needed (use as directed for anaphalaxis reaction). 2 each 1   fluticasone  (FLONASE ) 50 MCG/ACT nasal spray Place 1 spray into both nostrils 2 (two) times daily. 16 g 2   fluticasone  (FLOVENT  HFA) 44 MCG/ACT inhaler Inhale 2 puffs into the lungs 2 (two) times daily. 31.8 g 12   gabapentin  (NEURONTIN ) 100 MG capsule Take 1 capsule (100 mg total) by mouth 2 (two) times daily as needed. 90 capsule 1   hydrOXYzine  (ATARAX ) 25 MG tablet Take 1 tablet (25 mg total) by mouth 2 (two) times daily. Take 2 tablet  ( 50mg  total ) by mouth at night 60 tablet 0   insulin  glargine (LANTUS ) 100 UNIT/ML Solostar Pen Inject 50 units every morning and 15 units every evening for diabetes. 60 mL 1   Insulin  Pen Needle (PEN NEEDLES) 31G X 6 MM MISC Use nightly with insulin . 100 each 3   mirtazapine  (REMERON ) 7.5 MG tablet Take 1 tablet (7.5 mg total) by mouth at bedtime. 90 tablet 1   propranolol  ER (INDERAL  LA) 80 MG 24 hr capsule Take 1 capsule (80 mg total) by mouth at bedtime. For headache prevention 90 capsule 1   SUMAtriptan  (IMITREX ) 50 MG tablet Take 1 tablet by mouth at migraine onset. May repeat in 2 hours if headache persists or recurs. 10 tablet 0   tirzepatide  (MOUNJARO ) 7.5 MG/0.5ML Pen Inject 7.5 mg into the skin once a week. for diabetes. 6 mL 0   triamcinolone  cream (KENALOG ) 0.1 % Apply 1 Application topically 2 (two) times daily. (Patient not taking: Reported on 01/28/2024) 45 g 0   valsartan  (DIOVAN ) 80 MG tablet Take 1 tablet (80 mg total) by mouth daily. for blood pressure. 90 tablet 3   No current facility-administered medications for this visit.      Musculoskeletal: Virtual assessment   Psychiatric Specialty Exam: Review of Systems  Last menstrual period 07/24/2018.There is no height or weight on file to calculate BMI.  General Appearance: Casual  Eye Contact:  Good  Speech:  Clear and Coherent  Volume:  Normal  Mood:  Anxious and Depressed  Affect:  Congruent  Thought Process:  Coherent  Orientation:  Full (Time, Place, and Person)  Thought Content: Logical   Suicidal Thoughts:  No  Homicidal Thoughts:  No  Memory:  Immediate;   Good Recent;   Good  Judgement:  Good  Insight:  Good  Psychomotor Activity:  Normal  Concentration:  Concentration: Good  Recall:  Good  Fund of Knowledge: Good  Language: Good  Akathisia:  No  Handed:  Right  AIMS (if indicated): not done  Assets:  Communication Skills Desire for Improvement  ADL's:  Intact  Cognition: WNL  Sleep:  Good   Screenings: AIMS    Flowsheet Row [facility]  AIMS Total Score 0 0   AUDIT    Flowsheet Row [facility]  Alcohol Use Disorder Identification Test Final Score (AUDIT) 0 0 0   GAD-7    Flowsheet Row Counselor from 11/24/2023 in [facility] from 10/22/2023 in [facility] at [facility] from  09/24/2023 in [facility] at [facility] from 09/10/2023 in [facility] at [facility] from 07/29/2023 in [facility] at [facility]  Total GAD-7 Score 4 3 1 3 7     [id number]    Flowsheet Row Counselor from 11/24/2023 in [facility] at [facility] Visit from 10/29/2023 in [facility] [place] Counselor from 10/22/2023 in [place] [facility] at [facility] from 09/24/2023 in [facility] at [facility] from 09/10/2023 in [facility] at [facility] Total Score 1 0 2 0 1  PHQ-9 Total Score 7 -- 5 0 2   Flowsheet Row UC from 01/15/2024 in [facility]) UC from 12/22/2023 in [facility] UC from 08/27/2023 in [facility] at [facility])  C-SSRS RISK CATEGORY No Risk No Risk No Risk     Assessment and Plan: Arbell Wycoff is a 43 year old female who presents for medication management appointment.  Reports overall her mood is stabilized.  No concerns related to suicidal or homicidal ideations.  Denied auditory visual hallucinations.  Rating her depression and anxiety 3 out of 10 with 10 being the worst.  Reports her symptoms have been controlled with gabapentin  and mirtazapine .  States she is recently tapered off Zoloft  as she states she no longer leaks medication at this time.  Did advise patient regarding SSRIs and managing anxiety symptoms.  States she would like to completely discontinue medications at this time.  Patient also was inquiring about individual therapy.  She is currently on the waiting list for an office therapist.  Will continue to monitor symptoms.  Support encouragement reassurance was provided.  Patient to follow-up 3 months for medication management.  To continue mirtazapine  7.5 mg nightly and gabapentin  100 mg p.o. twice daily-refills made available at this visit.  Collaboration of Care: Collaboration of Care: Other seeking therapy services  Patient/Guardian was advised Release  of Information must be obtained prior to any record release in order to collaborate their care with an outside provider. Patient/Guardian was advised if they have not already done so to contact the registration department to sign all necessary forms in order for us  to release information regarding their care.   Consent: Patient/Guardian gives verbal consent for treatment and assignment of benefits for services provided during this visit. Patient/Guardian expressed understanding and agreed to proceed.    Staci LOISE Kerns, NP 02/16/2024, 6:23 PM

## 2024-02-17 ENCOUNTER — Encounter (INDEPENDENT_AMBULATORY_CARE_PROVIDER_SITE_OTHER): Payer: MEDICAID

## 2024-02-17 DIAGNOSIS — E1165 Type 2 diabetes mellitus with hyperglycemia: Secondary | ICD-10-CM

## 2024-02-17 MED ORDER — PEN NEEDLES 31G X 6 MM MISC
3 refills | Status: AC
Start: 2024-02-17 — End: ?

## 2024-02-17 MED ORDER — INSULIN GLARGINE 100 UNIT/ML SOLOSTAR PEN: 50.0000 [IU] | PEN_INJECTOR | Freq: Every day | SUBCUTANEOUS | 0 refills | Status: DC

## 2024-02-23 DIAGNOSIS — E1165 Type 2 diabetes mellitus with hyperglycemia: Secondary | ICD-10-CM

## 2024-02-23 DIAGNOSIS — Z7985 Long-term (current) use of injectable non-insulin antidiabetic drugs: Secondary | ICD-10-CM

## 2024-02-25 ENCOUNTER — Telehealth (HOSPITAL_COMMUNITY): Payer: Self-pay

## 2024-02-25 MED ORDER — SERTRALINE HCL 25 MG PO TABS: ORAL_TABLET | ORAL | 0 refills | Status: DC

## 2024-02-25 NOTE — Telephone Encounter (Cosign Needed)
 Patient to restart Zoloft  25 mg daily and follow-up in 1 month.

## 2024-02-25 NOTE — Telephone Encounter (Signed)
 Patient is calling because she is not doing well without her Zoloft , she wants to know if she can start it again but at a low dose, like 25 mg. Please review and advise, thank you

## 2024-02-26 MED ORDER — TIRZEPATIDE 10 MG/0.5ML ~~LOC~~ SOAJ: 10.0000 mg | SUBCUTANEOUS | 0 refills | Status: DC

## 2024-02-26 NOTE — Telephone Encounter (Signed)
 Please see the MyChart message reply(ies) for my assessment and plan.  The patient gave consent for this Medical Advice Message and is aware that it may result in a bill to their insurance company as well as the possibility that this may result in a co-payment or deductible. They are an established patient, but are not seeking medical advice exclusively about a problem treated during an in person or video visit in the last 7 days. I did not recommend an in person or video visit within 7 days of my reply.  I spent a total of 12 minutes cumulative time within 7 days through MyChart messaging Jairon Ripberger K Madisun Hargrove, NP

## 2024-03-02 ENCOUNTER — Other Ambulatory Visit (HOSPITAL_COMMUNITY): Payer: Self-pay | Admitting: Family

## 2024-03-10 ENCOUNTER — Other Ambulatory Visit (HOSPITAL_COMMUNITY): Payer: Self-pay

## 2024-03-10 MED ORDER — GABAPENTIN 100 MG PO CAPS: 100.0000 mg | ORAL_CAPSULE | Freq: Two times a day (BID) | ORAL | 1 refills | Status: DC | PRN

## 2024-03-26 ENCOUNTER — Other Ambulatory Visit: Payer: Self-pay

## 2024-03-26 ENCOUNTER — Ambulatory Visit
Admission: EM | Admit: 2024-03-26 | Discharge: 2024-03-26 | Disposition: A | Discharge status: Home / Self Care | Payer: MEDICAID | Attending: Physician Assistant | Admitting: Physician Assistant

## 2024-03-26 DIAGNOSIS — J029 Acute pharyngitis, unspecified: Secondary | ICD-10-CM

## 2024-03-26 DIAGNOSIS — J069 Acute upper respiratory infection, unspecified: Secondary | ICD-10-CM | POA: Diagnosis not present

## 2024-03-26 LAB — POC COVID19/FLU A&B COMBO
Covid Antigen, POC: NEGATIVE
Influenza A Antigen, POC: NEGATIVE
Influenza B Antigen, POC: NEGATIVE

## 2024-03-26 LAB — POCT RAPID STREP A (OFFICE): Rapid Strep A Screen: NEGATIVE

## 2024-03-26 NOTE — Discharge Instructions (Signed)
 Based on your described symptoms and the duration of symptoms it is likely that you have a viral upper respiratory infection (often called a "cold")  Symptoms can last for 3-10 days with lingering cough and intermittent symptoms potentially  lasting several  weeks after that.  The goal of treatment at this time is to reduce your symptoms and discomfort   You can use the following medications and measures to help yourself feel better until your body fights this off: DayQuil/NyQuil, TheraFlu, Alka-Seltzer  (these medications typically have the same active ingredients in them so you can choose whichever one you prefer and take consistently during the day and night according to the manufactures instructions.) Flonase  A daily antihistamine such as Zyrtec , Claritin, Allegra per your preference.  Please choose 1 and take consistently. Increased fluids.  It is recommended that you take in at least 64 ounces of water per day when you are not sick so it is important to increase this when you are sick and your body may be running fever. Rest Cough drops Chloraseptic throat spray to help with sore throat Nasal saline spray or nasal flushes to help with congestion and runny nose  If your symptoms seem like they are getting worse over the next 5 to 7 days or not improving you can always follow-up here in urgent care or go to your primary care provider for further management. Go to the ER if you begin to have more serious symptoms such as shortness of breath, trouble breathing, loss of consciousness, swelling around the eyes, high fever, severe lasting headaches, vision changes or neck pain/stiffness.

## 2024-03-26 NOTE — ED Triage Notes (Addendum)
 Pt presents with complaints of sore throat and bilateral ear pain x 3 days. States she is feeling pressure in her head. Denies fevers at home, has had chills. Currently rates overall pain an 8/10. Works with children. OTC Tylenol  taken with no improvement in symptoms, last dose was last night, 8/21.

## 2024-03-26 NOTE — ED Provider Notes (Signed)
 [facility]    CSN: [id number] Arrival date & time: 03/26/24  9074      History   Chief Complaint Chief Complaint  Patient presents with   Sore Throat   Otalgia    HPI Joan [name] is a 43 y.o. female.   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 She also reports mild body aches and really bad headache  Interventions: Tylenol     Past Medical History:  Diagnosis Date   Acute asthma exacerbation 04/10/2018   Acute bacterial conjunctivitis of right eye 05/01/2023   Acute non-recurrent maxillary sinusitis 09/25/2018   Asthma    Asthma exacerbation 08/03/2020   Auditory hallucinations    only after anesthesia   BV (bacterial vaginosis) 02/13/2018   COVID-19 virus infection 08/16/2019   Diabetes mellitus without complication (HCC)    diet controlled   Diabetes mellitus, type II (HCC)    insulin , jardiance    Dyspnea    with exertion   Essential hypertension    Essential hypertension 11/24/2017   GERD (gastroesophageal reflux disease)    occasionally-NO MEDS   History of placement of ear tubes 05/20/2018   Hyperlipidemia    Kidney stone 02/11/2019   Localized skin mass, lump, or swelling 01/26/2021   MDD (major depressive disorder)    Miscarriage    Nausea & vomiting 10/05/2019   PTSD (post-traumatic stress disorder)    Right lower quadrant abdominal pain 02/13/2018   Tachycardia     Patient Active Problem List   Diagnosis Date Noted   Preventative health care 10/29/2023   Environmental and seasonal allergies 10/03/2023   Achilles tendinitis, left leg 09/16/2023   Chronic heel pain, left 09/12/2023   Major depressive disorder, recurrent episode, moderate (HCC) 06/09/2023   Vertigo 05/29/2023   Bipolar 2 disorder, major depressive episode (HCC) 10/23/2022   Migraines 01/26/2021   GAD (generalized  anxiety disorder) 10/24/2020   Asthma 11/03/2018   Recurrent sinusitis 05/20/2018   Tachycardia 05/04/2018   Uncontrolled type 2 diabetes mellitus with hyperglycemia (HCC) 11/24/2017   Essential hypertension 11/24/2017   Hyperlipidemia 11/24/2017   Major depressive disorder, recurrent, severe without psychotic features (HCC)    PTSD (post-traumatic stress disorder) 04/11/2015    Past Surgical History:  Procedure Laterality Date   ABDOMINAL HYSTERECTOMY     ADENOIDECTOMY     CHOLECYSTECTOMY  2009   CYSTOSCOPY N/A 07/31/2018   Procedure: CYSTOSCOPY;  Surgeon: Verdon Keen, MD;  Location: [facility];  Service: Gynecology;  Laterality: N/A;   LAPAROSCOPIC BILATERAL SALPINGECTOMY Bilateral 07/31/2018   Procedure: LAPAROSCOPIC BILATERAL SALPINGECTOMY;  Surgeon: Verdon Keen, MD;  Location: [facility];  Service: Gynecology;  Laterality: Bilateral;   LAPAROSCOPIC HYSTERECTOMY N/A 07/31/2018   Procedure: HYSTERECTOMY TOTAL LAPAROSCOPIC;  Surgeon: Verdon Keen, MD;  Location: [facility];  Service: Gynecology;  Laterality: N/A;   LAPAROSCOPY N/A 06/07/2019   Procedure: LAPAROSCOPY OPERATIVE, WITH PERITONEAL BIOPSIES;  Surgeon: Verdon Keen, MD;  Location: [facility];  Service: Gynecology;  Laterality: N/A;   LYSIS OF ADHESION N/A 07/31/2018   Procedure: LYSIS OF ADHESION;  Surgeon: Verdon Keen, MD;  Location: [facility];  Service: Gynecology;  Laterality: N/A;   OVARY SURGERY Right    cyst removed a while ago   TONSILLECTOMY     tubes in ear      OB History   No obstetric history  on file.      Home Medications    Prior to Admission medications   Medication Sig Start Date End Date Taking? Authorizing Provider  albuterol  (VENTOLIN  HFA) 108 (90 Base) MCG/ACT inhaler Inhale 1-2 puffs into the lungs every 6 (six) hours as needed for wheezing or shortness of breath. 08/27/23   Enedelia Dorna HERO, FNP  atorvastatin  (LIPITOR) 20 MG tablet Take 1 tablet (20 mg total) by mouth  daily. for cholesterol. 10/30/23   Clark, Katherine K, NP  azelastine  (ASTELIN ) 0.1 % nasal spray Place 2 sprays into both nostrils 2 (two) times daily. Use in each nostril as directed 12/08/23   Lorin Norris, MD  cetirizine  (ZYRTEC ) 10 MG tablet Take 1 tablet (10 mg total) by mouth daily. 12/08/23   Lorin Norris, MD  Continuous Glucose Receiver (DEXCOM G7 RECEIVER) DEVI Use to check blood sugars. 05/02/23   Gretta Comer POUR, NP  Continuous Glucose Sensor (DEXCOM G7 SENSOR) MISC Change every 10 days to check blood sugars continuously 12/23/23   Gretta Comer POUR, NP  EPINEPHrine  0.3 MG/0.3ML SOSY Inject 0.3 mLs (0.3 mg total) as directed as needed (use as directed for anaphalaxis reaction). 12/09/23   Lorin Norris, MD  fluticasone  (FLONASE ) 50 MCG/ACT nasal spray Place 1 spray into both nostrils 2 (two) times daily. 12/08/23   Lorin Norris, MD  fluticasone  (FLOVENT  HFA) 44 MCG/ACT inhaler Inhale 2 puffs into the lungs 2 (two) times daily. 12/08/23   Lorin Norris, MD  gabapentin  (NEURONTIN ) 100 MG capsule Take 1 capsule (100 mg total) by mouth 2 (two) times daily as needed. 03/10/24 06/08/24  Ezzard Staci SAILOR, NP  hydrOXYzine  (ATARAX ) 25 MG tablet Take 1 tablet (25 mg total) by mouth 2 (two) times daily. Take 2 tablet  ( 50mg  total ) by mouth at night 05/28/23   Ezzard Staci SAILOR, NP  insulin  glargine (LANTUS ) 100 UNIT/ML Solostar Pen Inject 50 Units into the skin daily. for diabetes. 02/17/24   Clark, Katherine K, NP  Insulin  Pen Needle (PEN NEEDLES) 31G X 6 MM MISC Use nightly with insulin . 02/17/24   Clark, Katherine K, NP  mirtazapine  (REMERON ) 7.5 MG tablet Take 1 tablet (7.5 mg total) by mouth at bedtime. 02/16/24   Ezzard Staci SAILOR, NP  propranolol  ER (INDERAL  LA) 80 MG 24 hr capsule Take 1 capsule (80 mg total) by mouth at bedtime. For headache prevention 05/01/23   Clark, Katherine K, NP  sertraline  (ZOLOFT ) 25 MG tablet Take 1 tablet (25 mg total) by mouth daily for 7 days, THEN 2 tablets (50  mg total) daily. 02/25/24 05/02/24  Ezzard Staci SAILOR, NP  SUMAtriptan  (IMITREX ) 50 MG tablet Take 1 tablet by mouth at migraine onset. May repeat in 2 hours if headache persists or recurs. 05/01/23   Clark, Katherine K, NP  tirzepatide  (MOUNJARO ) 10 MG/0.5ML Pen Inject 10 mg into the skin once a week. for diabetes. 02/26/24   Clark, Katherine K, NP  triamcinolone  cream (KENALOG ) 0.1 % Apply 1 Application topically 2 (two) times daily. Patient not taking: Reported on 01/28/2024 12/20/23   Richad Jon HERO, NP  valsartan  (DIOVAN ) 80 MG tablet Take 1 tablet (80 mg total) by mouth daily. for blood pressure. 10/30/23   Gretta Comer POUR, NP    Family History Family History  Problem Relation Age of Onset   Asthma Mother    Diabetes Mother    Hyperlipidemia Mother    Hypertension Mother    Diabetes Father    Hyperlipidemia Father  Hypertension Father    Diabetes Brother    Depression Brother    Alcohol abuse Brother    Depression Maternal Grandmother    Stroke Paternal Grandmother    Breast cancer Neg Hx     Social History Social History   Tobacco Use   Smoking status: Never    Passive exposure: Never   Smokeless tobacco: Never  Vaping Use   Vaping status: Never Used  Substance Use Topics   Alcohol use: No   Drug use: No     Allergies   Ibuprofen, Ciprofloxacin, Metformin  and related, Penicillins, Sulfa antibiotics, Other, Shellfish allergy , and Trazodone  and nefazodone   Review of Systems Review of Systems  Constitutional:  Positive for chills. Negative for fever.  HENT:  Positive for ear pain, sinus pressure and sore throat.   Respiratory:  Positive for cough. Negative for shortness of breath and wheezing.   Gastrointestinal:  Negative for diarrhea, nausea and vomiting.  Musculoskeletal:  Positive for myalgias (mild).  Neurological:  Positive for headaches. Negative for dizziness and light-headedness.     Physical Exam Triage Vital Signs ED Triage Vitals  Encounter  Vitals Group     BP 03/26/24 0938 131/85     Girls Systolic BP Percentile --      Girls Diastolic BP Percentile --      Boys Systolic BP Percentile --      Boys Diastolic BP Percentile --      Pulse Rate 03/26/24 0938 92     Resp 03/26/24 0938 18     Temp 03/26/24 0938 (!) 97.4 F (36.3 C)     Temp Source 03/26/24 0938 Oral     SpO2 03/26/24 0938 96 %     Weight 03/26/24 0940 288 lb (130.6 kg)     Height 03/26/24 0940 5' 6 (1.676 m)     Head Circumference --      Peak Flow --      Pain Score 03/26/24 0939 8     Pain Loc --      Pain Education --      Exclude from Growth Chart --    No data found.  Updated Vital Signs BP 131/85 (BP Location: Right Arm)   Pulse 92   Temp (!) 97.4 F (36.3 C) (Oral)   Resp 18   Ht 5' 6 (1.676 m)   Wt 288 lb (130.6 kg)   LMP 07/24/2018 (Exact Date) Comment: surgery 07/31/2018  SpO2 96%   BMI 46.48 kg/m   Visual Acuity Right Eye Distance:   Left Eye Distance:   Bilateral Distance:    Right Eye Near:   Left Eye Near:    Bilateral Near:     Physical Exam Vitals reviewed.  Constitutional:      General: She is awake.     Appearance: Normal appearance. She is well-developed and well-groomed.  HENT:     Head: Normocephalic and atraumatic.     Right Ear: Hearing, tympanic membrane and ear canal normal.     Left Ear: Hearing, tympanic membrane and ear canal normal.     Mouth/Throat:     Lips: Pink.     Mouth: Mucous membranes are moist.     Pharynx: Oropharynx is clear. Uvula midline. No pharyngeal swelling, oropharyngeal exudate, posterior oropharyngeal erythema, uvula swelling or postnasal drip.  Cardiovascular:     Rate and Rhythm: Normal rate and regular rhythm.     Pulses: Normal pulses.  Radial pulses are 2+ on the right side and 2+ on the left side.     Heart sounds: Normal heart sounds. No murmur heard.    No friction rub. No gallop.  Pulmonary:     Effort: Pulmonary effort is normal.     Breath sounds: Normal  breath sounds. No decreased air movement. No decreased breath sounds, wheezing, rhonchi or rales.  Musculoskeletal:     Cervical back: Normal range of motion and neck supple.  Lymphadenopathy:     Head:     Right side of head: No submental, submandibular or preauricular adenopathy.     Left side of head: No submental, submandibular or preauricular adenopathy.     Cervical:     Right cervical: No superficial cervical adenopathy.    Left cervical: No superficial cervical adenopathy.     Upper Body:     Right upper body: No supraclavicular adenopathy.     Left upper body: No supraclavicular adenopathy.  Neurological:     Mental Status: She is alert.  Psychiatric:        Behavior: Behavior is cooperative.      UC Treatments / Results  Labs (all labs ordered are listed, but only abnormal results are displayed) Labs Reviewed  POC COVID19/FLU A&B COMBO  POCT RAPID STREP A (OFFICE)    EKG   Radiology No results found.  Procedures Procedures (including critical care time)  Medications Ordered in UC Medications - No data to display  Initial Impression / Assessment and Plan / UC Course  I have reviewed the triage vital signs and the nursing notes.  Pertinent labs & imaging results that were available during my care of the patient were reviewed by me and considered in my medical decision making (see chart for details).      Final Clinical Impressions(s) / UC Diagnoses   Final diagnoses:  Sore throat  Viral upper respiratory tract infection   Visit with patient indicates symptoms comprised of sore throat, chills, body aches, headaches for the past 3 days  congruent with acute URI that is likely viral in nature  Patient has tested negative for COVID, Flu and strep in clinic today Physical exam and vitals are reassuring without obvious evidence of bacterial sinus or ear infection or pneumonia  Due to nature and duration of symptoms recommended treatment regimen is  symptomatic relief and follow up if needed Discussed with patient the various viral and bacterial etiologies of current illness and appropriate course of treatment Discussed OTC medication options for multisymptom relief such as Dayquil/Nyquil, Theraflu, AlkaSeltzer, etc. Discussed return precautions if symptoms are not improving or worsen over next 5-7 days.      Discharge Instructions      Based on your described symptoms and the duration of symptoms it is likely that you have a viral upper respiratory infection (often called a cold)  Symptoms can last for 3-10 days with lingering cough and intermittent symptoms potentially  lasting several  weeks after that.  The goal of treatment at this time is to reduce your symptoms and discomfort   You can use the following medications and measures to help yourself feel better until your body fights this off: DayQuil/NyQuil, TheraFlu, Alka-Seltzer  (these medications typically have the same active ingredients in them so you can choose whichever one you prefer and take consistently during the day and night according to the manufactures instructions.) Flonase  A daily antihistamine such as Zyrtec , Claritin, Allegra per your preference.  Please choose 1  and take consistently. Increased fluids.  It is recommended that you take in at least 64 ounces of water per day when you are not sick so it is important to increase this when you are sick and your body may be running fever. Rest Cough drops Chloraseptic throat spray to help with sore throat Nasal saline spray or nasal flushes to help with congestion and runny nose  If your symptoms seem like they are getting worse over the next 5 to 7 days or not improving you can always follow-up here in urgent care or go to your primary care provider for further management. Go to the ER if you begin to have more serious symptoms such as shortness of breath, trouble breathing, loss of consciousness, swelling around  the eyes, high fever, severe lasting headaches, vision changes or neck pain/stiffness.       ED Prescriptions   None    PDMP not reviewed this encounter.   Heyli Min, Rocky BRAVO, PA-C 03/26/24 1031

## 2024-03-30 ENCOUNTER — Encounter: Payer: Self-pay | Admitting: Emergency Medicine

## 2024-03-30 ENCOUNTER — Ambulatory Visit
Admission: EM | Admit: 2024-03-30 | Discharge: 2024-03-30 | Disposition: A | Discharge status: Home / Self Care | Payer: MEDICAID | Attending: Nurse Practitioner | Admitting: Nurse Practitioner

## 2024-03-30 DIAGNOSIS — R059 Cough, unspecified: Secondary | ICD-10-CM

## 2024-03-30 DIAGNOSIS — B9689 Other specified bacterial agents as the cause of diseases classified elsewhere: Secondary | ICD-10-CM

## 2024-03-30 DIAGNOSIS — J069 Acute upper respiratory infection, unspecified: Secondary | ICD-10-CM

## 2024-03-30 MED ORDER — PROMETHAZINE-DM 6.25-15 MG/5ML PO SYRP: 5.0000 mL | ORAL_SOLUTION | Freq: Four times a day (QID) | ORAL | 0 refills | Status: DC | PRN

## 2024-03-30 MED ORDER — AZITHROMYCIN 250 MG PO TABS: 250.0000 mg | ORAL_TABLET | Freq: Every day | ORAL | 0 refills | Status: DC

## 2024-03-30 NOTE — ED Triage Notes (Signed)
 Cough, sore throat, bilateral ear pain, nasal congestion body aches and tired since Thursday.  Was seen at an urgent care on Friday and was covid, strep and flu tested and all was negative.   States she is feeling worse.

## 2024-03-30 NOTE — ED Provider Notes (Signed)
 [facility]    CSN: [id number] Arrival date & time: 03/30/24  1130      History   Chief Complaint No chief complaint on file.   HPI Joan [name] is a 43 y.o. female.   The history is provided by the patient.   Patient presents for follow-up for continued cough, sore throat, bilateral ear pain, nasal congestion, and bodyaches.  Patient was seen at a sister urgent care on 8/22, COVID test, rapid strep test, and flu test were all negative.  Patient states since that time, she has been taking over-the-counter Tylenol  and using Flonase  and cetirizine  for her symptoms.  Patient states that her symptoms appear to be worsening.  She denies fever, headache, wheezing, difficulty breathing, abdominal pain, nausea, vomiting, diarrhea, or rash.  Past Medical History:  Diagnosis Date   Acute asthma exacerbation 04/10/2018   Acute bacterial conjunctivitis of right eye 05/01/2023   Acute non-recurrent maxillary sinusitis 09/25/2018   Asthma    Asthma exacerbation 08/03/2020   Auditory hallucinations    only after anesthesia   BV (bacterial vaginosis) 02/13/2018   COVID-19 virus infection 08/16/2019   Diabetes mellitus without complication (HCC)    diet controlled   Diabetes mellitus, type II (HCC)    insulin , jardiance    Dyspnea    with exertion   Essential hypertension    Essential hypertension 11/24/2017   GERD (gastroesophageal reflux disease)    occasionally-NO MEDS   History of placement of ear tubes 05/20/2018   Hyperlipidemia    Kidney stone 02/11/2019   Localized skin mass, lump, or swelling 01/26/2021   MDD (major depressive disorder)    Miscarriage    Nausea & vomiting 10/05/2019   PTSD (post-traumatic stress disorder)    Right lower quadrant abdominal pain 02/13/2018   Tachycardia     Patient Active Problem List   Diagnosis Date Noted   Preventative health care 10/29/2023   Environmental and seasonal allergies 10/03/2023   Achilles tendinitis, left  leg 09/16/2023   Chronic heel pain, left 09/12/2023   Major depressive disorder, recurrent episode, moderate (HCC) 06/09/2023   Vertigo 05/29/2023   Bipolar 2 disorder, major depressive episode (HCC) 10/23/2022   Migraines 01/26/2021   GAD (generalized anxiety disorder) 10/24/2020   Asthma 11/03/2018   Recurrent sinusitis 05/20/2018   Tachycardia 05/04/2018   Uncontrolled type 2 diabetes mellitus with hyperglycemia (HCC) 11/24/2017   Essential hypertension 11/24/2017   Hyperlipidemia 11/24/2017   Major depressive disorder, recurrent, severe without psychotic features (HCC)    PTSD (post-traumatic stress disorder) 04/11/2015    Past Surgical History:  Procedure Laterality Date   ABDOMINAL HYSTERECTOMY     ADENOIDECTOMY     CHOLECYSTECTOMY  2009   CYSTOSCOPY N/A 07/31/2018   Procedure: CYSTOSCOPY;  Surgeon: Verdon Keen, MD;  Location: [facility];  Service: Gynecology;  Laterality: N/A;   LAPAROSCOPIC BILATERAL SALPINGECTOMY Bilateral 07/31/2018   Procedure: LAPAROSCOPIC BILATERAL SALPINGECTOMY;  Surgeon: Verdon Keen, MD;  Location: [facility];  Service: Gynecology;  Laterality: Bilateral;   LAPAROSCOPIC HYSTERECTOMY N/A 07/31/2018   Procedure: HYSTERECTOMY TOTAL LAPAROSCOPIC;  Surgeon: Verdon Keen, MD;  Location: [facility];  Service: Gynecology;  Laterality: N/A;   LAPAROSCOPY N/A 06/07/2019   Procedure: LAPAROSCOPY OPERATIVE, WITH PERITONEAL BIOPSIES;  Surgeon: Verdon Keen, MD;  Location: [facility];  Service: Gynecology;  Laterality: N/A;   LYSIS OF ADHESION N/A 07/31/2018   Procedure: LYSIS OF ADHESION;  Surgeon: Verdon Keen, MD;  Location: [facility];  Service: Gynecology;  Laterality: N/A;  OVARY SURGERY Right    cyst removed a while ago   TONSILLECTOMY     tubes in ear      OB History   No obstetric history on file.      Home Medications    Prior to Admission medications   Medication Sig Start Date End Date Taking? Authorizing Provider  albuterol   (VENTOLIN  HFA) 108 (90 Base) MCG/ACT inhaler Inhale 1-2 puffs into the lungs every 6 (six) hours as needed for wheezing or shortness of breath. 08/27/23   Enedelia Dorna HERO, FNP  atorvastatin  (LIPITOR) 20 MG tablet Take 1 tablet (20 mg total) by mouth daily. for cholesterol. 10/30/23   Clark, Katherine K, NP  azelastine  (ASTELIN ) 0.1 % nasal spray Place 2 sprays into both nostrils 2 (two) times daily. Use in each nostril as directed 12/08/23   Lorin Norris, MD  cetirizine  (ZYRTEC ) 10 MG tablet Take 1 tablet (10 mg total) by mouth daily. 12/08/23   Lorin Norris, MD  Continuous Glucose Receiver (DEXCOM G7 RECEIVER) DEVI Use to check blood sugars. 05/02/23   Gretta Comer POUR, NP  Continuous Glucose Sensor (DEXCOM G7 SENSOR) MISC Change every 10 days to check blood sugars continuously 12/23/23   Gretta Comer POUR, NP  EPINEPHrine  0.3 MG/0.3ML SOSY Inject 0.3 mLs (0.3 mg total) as directed as needed (use as directed for anaphalaxis reaction). 12/09/23   Lorin Norris, MD  fluticasone  (FLONASE ) 50 MCG/ACT nasal spray Place 1 spray into both nostrils 2 (two) times daily. 12/08/23   Lorin Norris, MD  fluticasone  (FLOVENT  HFA) 44 MCG/ACT inhaler Inhale 2 puffs into the lungs 2 (two) times daily. 12/08/23   Lorin Norris, MD  gabapentin  (NEURONTIN ) 100 MG capsule Take 1 capsule (100 mg total) by mouth 2 (two) times daily as needed. 03/10/24 06/08/24  Ezzard Staci SAILOR, NP  hydrOXYzine  (ATARAX ) 25 MG tablet Take 1 tablet (25 mg total) by mouth 2 (two) times daily. Take 2 tablet  ( 50mg  total ) by mouth at night 05/28/23   Ezzard Staci SAILOR, NP  insulin  glargine (LANTUS ) 100 UNIT/ML Solostar Pen Inject 50 Units into the skin daily. for diabetes. 02/17/24   Clark, Katherine K, NP  Insulin  Pen Needle (PEN NEEDLES) 31G X 6 MM MISC Use nightly with insulin . 02/17/24   Clark, Katherine K, NP  mirtazapine  (REMERON ) 7.5 MG tablet Take 1 tablet (7.5 mg total) by mouth at bedtime. 02/16/24   Ezzard Staci SAILOR, NP   propranolol  ER (INDERAL  LA) 80 MG 24 hr capsule Take 1 capsule (80 mg total) by mouth at bedtime. For headache prevention 05/01/23   Clark, Katherine K, NP  sertraline  (ZOLOFT ) 25 MG tablet Take 1 tablet (25 mg total) by mouth daily for 7 days, THEN 2 tablets (50 mg total) daily. 02/25/24 05/02/24  Ezzard Staci SAILOR, NP  SUMAtriptan  (IMITREX ) 50 MG tablet Take 1 tablet by mouth at migraine onset. May repeat in 2 hours if headache persists or recurs. 05/01/23   Gretta Comer POUR, NP  tirzepatide  (MOUNJARO ) 10 MG/0.5ML Pen Inject 10 mg into the skin once a week. for diabetes. 02/26/24   Clark, Katherine K, NP  triamcinolone  cream (KENALOG ) 0.1 % Apply 1 Application topically 2 (two) times daily. Patient not taking: Reported on 01/28/2024 12/20/23   Richad Jon HERO, NP  valsartan  (DIOVAN ) 80 MG tablet Take 1 tablet (80 mg total) by mouth daily. for blood pressure. 10/30/23   Gretta Comer POUR, NP    Family History Family History  Problem Relation Age  of Onset   Asthma Mother    Diabetes Mother    Hyperlipidemia Mother    Hypertension Mother    Diabetes Father    Hyperlipidemia Father    Hypertension Father    Diabetes Brother    Depression Brother    Alcohol abuse Brother    Depression Maternal Grandmother    Stroke Paternal Grandmother    Breast cancer Neg Hx     Social History Social History   Tobacco Use   Smoking status: Never    Passive exposure: Never   Smokeless tobacco: Never  Vaping Use   Vaping status: Never Used  Substance Use Topics   Alcohol use: No   Drug use: No     Allergies   Ibuprofen, Ciprofloxacin, Metformin  and related, Penicillins, Sulfa antibiotics, Other, Shellfish allergy , and Trazodone  and nefazodone   Review of Systems Review of Systems Per HPI  Physical Exam Triage Vital Signs ED Triage Vitals  Encounter Vitals Group     BP 03/30/24 1143 122/85     Girls Systolic BP Percentile --      Girls Diastolic BP Percentile --      Boys Systolic BP  Percentile --      Boys Diastolic BP Percentile --      Pulse Rate 03/30/24 1143 90     Resp 03/30/24 1143 18     Temp 03/30/24 1143 97.8 F (36.6 C)     Temp Source 03/30/24 1143 Oral     SpO2 03/30/24 1143 96 %     Weight --      Height --      Head Circumference --      Peak Flow --      Pain Score 03/30/24 1145 8     Pain Loc --      Pain Education --      Exclude from Growth Chart --    No data found.  Updated Vital Signs BP 122/85 (BP Location: Right Arm)   Pulse 90   Temp 97.8 F (36.6 C) (Oral)   Resp 18   LMP 07/24/2018 (Exact Date) Comment: surgery 07/31/2018  SpO2 96%   Visual Acuity Right Eye Distance:   Left Eye Distance:   Bilateral Distance:    Right Eye Near:   Left Eye Near:    Bilateral Near:     Physical Exam Vitals and nursing note reviewed.  Constitutional:      General: She is not in acute distress.    Appearance: Normal appearance.  HENT:     Head: Normocephalic.     Right Ear: Tympanic membrane, ear canal and external ear normal.     Left Ear: Tympanic membrane, ear canal and external ear normal.     Nose: Congestion present.     Right Turbinates: Enlarged and swollen.     Left Turbinates: Enlarged and swollen.     Right Sinus: No maxillary sinus tenderness or frontal sinus tenderness.     Left Sinus: No maxillary sinus tenderness or frontal sinus tenderness.     Mouth/Throat:     Lips: Pink.     Mouth: Mucous membranes are moist.     Pharynx: Posterior oropharyngeal erythema and postnasal drip present. No pharyngeal swelling, oropharyngeal exudate or uvula swelling.     Comments: Cobblestoning present to posterior oropharynx  Eyes:     Extraocular Movements: Extraocular movements intact.     Conjunctiva/sclera: Conjunctivae normal.     Pupils: Pupils are equal, round, and  reactive to light.  Cardiovascular:     Rate and Rhythm: Normal rate and regular rhythm.     Pulses: Normal pulses.     Heart sounds: Normal heart sounds.   Pulmonary:     Effort: Pulmonary effort is normal. No respiratory distress.     Breath sounds: Normal breath sounds. No stridor. No wheezing, rhonchi or rales.  Abdominal:     General: Bowel sounds are normal.     Palpations: Abdomen is soft.     Tenderness: There is no abdominal tenderness.  Musculoskeletal:     Cervical back: Normal range of motion.  Skin:    General: Skin is warm and dry.  Neurological:     General: No focal deficit present.     Mental Status: She is alert and oriented to person, place, and time.  Psychiatric:        Mood and Affect: Mood normal.        Behavior: Behavior normal.      UC Treatments / Results  Labs (all labs ordered are listed, but only abnormal results are displayed) Labs Reviewed - No data to display  EKG   Radiology No results found.  Procedures Procedures (including critical care time)  Medications Ordered in UC Medications - No data to display  Initial Impression / Assessment and Plan / UC Course  I have reviewed the triage vital signs and the nursing notes.  Pertinent labs & imaging results that were available during my care of the patient were reviewed by me and considered in my medical decision making (see chart for details).  Patient presents for continued upper respiratory symptoms.  Patient was seen in our sister location on 03/26/2024 and diagnosed with a viral URI.  Over the past several days, symptoms have worsened with worsening cough, fatigue, sore throat, and bodyaches.  Previous point-of-care test performed were negative to include strep, COVID, and flu.  On exam, lung sounds are clear throughout, room air sats are at 99%.  The patient is ill-appearing.  Patient symptoms started approximately 3 days before she was initially seen.  Given the ongoing duration of the patient's symptoms with failure to improve, we will treat empirically with azithromycin  250 mg.  Symptomatic treatment provided with Promethazine  DM for the  cough.  Patient advised to continue fluticasone  and cetirizine .  Supportive care recommendations were provided and discussed with the patient to include fluids, rest, over-the-counter analgesics, use of normal saline nasal spray, warm salt water gargles, and use of a humidifier during sleep.  Discussed indications with patient regarding follow-up.  Patient was in agreement with this plan of care and verbalizes understanding.  All questions were answered.  Patient stable for discharge.  Work note was provided.  Final Clinical Impressions(s) / UC Diagnoses   Final diagnoses:  None   Discharge Instructions   None    ED Prescriptions   None    PDMP not reviewed this encounter.   Gilmer Etta PARAS, NP 03/30/24 1204

## 2024-03-30 NOTE — Discharge Instructions (Signed)
 Take medication as prescribed. Increase fluids and allow for plenty of rest. You may continue over-the-counter Tylenol  as needed for pain, fever, or general discomfort. Recommend the use of normal saline nasal spray throughout the day for nasal congestion or runny nose. Also recommend warm salt water gargles for throat pain or discomfort.  You may also use over-the-counter Chloraseptic throat spray or throat lozenges. For your cough, recommend use of a humidifier in your bedroom at nighttime during sleep and sleeping elevated on pillows while symptoms persist. If symptoms fail to improve with this treatment, you may follow-up in this clinic or with your primary care physician for further evaluation. Follow-up as needed.

## 2024-03-31 ENCOUNTER — Other Ambulatory Visit (HOSPITAL_COMMUNITY): Payer: Self-pay | Admitting: *Deleted

## 2024-03-31 MED ORDER — SERTRALINE HCL 50 MG PO TABS: ORAL_TABLET | ORAL | 1 refills | Status: DC

## 2024-04-12 ENCOUNTER — Ambulatory Visit: Payer: Self-pay

## 2024-04-12 ENCOUNTER — Encounter: Payer: Self-pay | Admitting: Family Medicine

## 2024-04-12 ENCOUNTER — Ambulatory Visit (INDEPENDENT_AMBULATORY_CARE_PROVIDER_SITE_OTHER): Payer: MEDICAID | Admitting: Family Medicine

## 2024-04-12 VITALS — BP 126/82 | HR 81 | Temp 98.5°F | Ht 66.0 in | Wt 286.4 lb

## 2024-04-12 DIAGNOSIS — R309 Painful micturition, unspecified: Secondary | ICD-10-CM | POA: Insufficient documentation

## 2024-04-12 LAB — POC URINALSYSI DIPSTICK (AUTOMATED)
Bilirubin, UA: NEGATIVE
Blood, UA: NEGATIVE
Glucose, UA: NEGATIVE
Ketones, UA: NEGATIVE
Leukocytes, UA: NEGATIVE
Nitrite, UA: NEGATIVE
Protein, UA: NEGATIVE
Spec Grav, UA: 1.01 (ref 1.010–1.025)
Urobilinogen, UA: NEGATIVE U/dL — AB
pH, UA: 6.5 (ref 5.0–8.0)

## 2024-04-12 MED ORDER — NITROFURANTOIN MONOHYD MACRO 100 MG PO CAPS: 100.0000 mg | ORAL_CAPSULE | Freq: Two times a day (BID) | ORAL | 0 refills | Status: DC

## 2024-04-12 NOTE — Progress Notes (Signed)
 dysuria: yes, burning frequency urgency.   duration of symptoms: since last night.  abdominal pain: some lower abd discomfort  and pressure in the lower abd pressure.  fevers:no back pain: some back pain.  vomiting:no H/o UTI, this feels similar.  U/a d/w pt.   Ucx pending.   Meds, vitals, and allergies reviewed.   Per HPI unless specifically indicated in ROS section   GEN: nad, alert and oriented HEENT: mucous membranes moist NECK: supple CV: rrr.  PULM: ctab, no inc wob ABD: soft, +bs, suprapubic area tender EXT: no edema SKIN: well perfused  BACK: not ttp in midline.

## 2024-04-12 NOTE — Patient Instructions (Signed)
Drink plenty of water and start the antibiotics today.  We'll contact you with your lab report.  Take care.   

## 2024-04-12 NOTE — Telephone Encounter (Signed)
 Noted and appreciate Dr. Harrel Lim evaluation.

## 2024-04-12 NOTE — Telephone Encounter (Signed)
 FYI Only or Action Required?: FYI only for provider.  Patient was last seen in primary care on 01/28/2024 by Gretta Comer POUR, NP.  Called Nurse Triage reporting Dysuria.  Symptoms began today.  Interventions attempted: OTC medications: Tylenol .  Symptoms are: burning/painful urination, urinary retention, urinary frequency, back/flank pain  unchanged.  Triage Disposition: See HCP Within 4 Hours (Or PCP Triage)  Patient/caregiver understands and will follow disposition?: Yes                Copied from CRM [id number]. Topic: Clinical - Red Word Triage >> Apr 12, 2024  7:36 AM Donna BRAVO wrote: Red Word that prompted transfer to Nurse Triage: patient has burning and painful urination, started last night Reason for Disposition  [1] SEVERE pain with urination (e.g., excruciating) AND [2] not improved after 2 hours of pain medicine  Answer Assessment - Initial Assessment Questions 1. SEVERITY: How bad is the pain?  (e.g., Scale 1-10; mild, moderate, or severe)     8/10.  2. FREQUENCY: How many times have you had painful urination today?      She states she was waking up every hour to hour and half last night, 5-6 times.  3. PATTERN: Is pain present every time you urinate or just sometimes?      Every time.  4. ONSET: When did the painful urination start?      Last night.  5. FEVER: Do you have a fever? If Yes, ask: What is your temperature, how was it measured, and when did it start?     No.  6. PAST UTI: Have you had a urine infection before? If Yes, ask: When was the last time? and What happened that time?      Yes, she states its been a good while since she has had one.  7. CAUSE: What do you think is causing the painful urination?  (e.g., UTI, scratch, Herpes sore)     UTI.  8. OTHER SYMPTOMS: Do you have any other symptoms? (e.g., blood in urine, flank pain, genital sores, urgency, vaginal discharge)     Denies blood in urine, nausea,  vomiting, fever. States flank and back pain, retention, frequency.  9. PREGNANCY: Is there any chance you are pregnant? When was your last menstrual period?     N/A, hysterectomy.  Protocols used: Urination Pain - Female-A-AH

## 2024-04-12 NOTE — Assessment & Plan Note (Signed)
 Presumed cystitis.   H/o UTI, this feels similar.  U/a d/w pt.   Ucx pending.  Macrobid  given allergy  to other meds.  Okay for outpatient f/u.  Supportive care o/w. She agrees with plan.

## 2024-04-13 LAB — URINE CULTURE
MICRO NUMBER:: 16936563
Result:: NO GROWTH
SPECIMEN QUALITY:: ADEQUATE

## 2024-04-15 ENCOUNTER — Other Ambulatory Visit (HOSPITAL_COMMUNITY): Payer: Self-pay

## 2024-04-15 ENCOUNTER — Telehealth: Payer: Self-pay

## 2024-04-15 DIAGNOSIS — E1165 Type 2 diabetes mellitus with hyperglycemia: Secondary | ICD-10-CM

## 2024-04-15 MED ORDER — FREESTYLE LIBRE 3 PLUS SENSOR MISC: 1 refills | Status: AC

## 2024-04-15 NOTE — Telephone Encounter (Signed)
 Pharmacy Patient Advocate Encounter   Received notification from Onbase that prior authorization for [facility] 3 plus sensors is required/requested.   Insurance verification completed.   The patient is insured through [employer] [facility] .   Per test claim: PA required; PA started via CoverMyMeds. KEY [id number] . Waiting for clinical questions to populate.

## 2024-04-16 ENCOUNTER — Other Ambulatory Visit (HOSPITAL_COMMUNITY): Payer: Self-pay

## 2024-04-16 NOTE — Telephone Encounter (Signed)
 Pharmacy Patient Advocate Encounter  Received notification from [employer] [facility] that Prior Authorization for [facility] 3 plus sensors has been APPROVED from 04/16/24 to 10/14/24. Unable to obtain price due to refill too soon rejection, last fill date 03/21/24 next available fill date10/24/25   PA #/Case ID/Reference #: # [id number]

## 2024-04-16 NOTE — Telephone Encounter (Signed)
 Additional information has been requested from the patient's insurance in order to proceed with the prior authorization request. Requested information has been sent, or form has been filled out and faxed back to [phone] PA [phone] 04/16/24

## 2024-04-16 NOTE — Telephone Encounter (Signed)
 Clinical questions have been answered and PA submitted. PA currently Pending. Please be advised that most companies allow up to 30 days to make a decision. We will advise when a determination has been made, or follow up in 1 week.   Please reach out to our team, Rx Prior Auth Pool, if you haven't heard back in a week.

## 2024-04-18 ENCOUNTER — Ambulatory Visit: Payer: Self-pay | Admitting: Family Medicine

## 2024-04-29 ENCOUNTER — Ambulatory Visit: Payer: Self-pay | Admitting: Primary Care

## 2024-04-29 ENCOUNTER — Ambulatory Visit: Payer: MEDICAID | Admitting: Primary Care

## 2024-04-29 ENCOUNTER — Encounter: Payer: Self-pay | Admitting: Primary Care

## 2024-04-29 VITALS — BP 112/70 | HR 90 | Temp 98.3°F | Ht 66.0 in | Wt 292.0 lb

## 2024-04-29 DIAGNOSIS — E1165 Type 2 diabetes mellitus with hyperglycemia: Secondary | ICD-10-CM | POA: Diagnosis not present

## 2024-04-29 LAB — POCT GLYCOSYLATED HEMOGLOBIN (HGB A1C): Hemoglobin A1C: 7.1 % — AB (ref 4.0–5.6)

## 2024-04-29 MED ORDER — MOUNJARO 12.5 MG/0.5ML ~~LOC~~ SOAJ: 12.5000 mg | SUBCUTANEOUS | 0 refills | Status: DC

## 2024-04-29 NOTE — Patient Instructions (Signed)
 We increased your dose of Mounjaro  to 12.5 mg weekly for diabetes.  Continue to work on improving your diet.  Increase physical activity.  Please schedule a follow up visit for 3 months.  It was a pleasure to see you today!
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
## 2024-04-29 NOTE — Assessment & Plan Note (Signed)
 Slightly worse with A1C today of 7.1.  Continue Lantus  50 units daily. Increase Mounjaro  to 12.5 mg weekly. She will work on exercise and trying to control cravings.  Follow up in 3 months.

## 2024-05-06 DIAGNOSIS — G43009 Migraine without aura, not intractable, without status migrainosus: Secondary | ICD-10-CM

## 2024-05-06 MED ORDER — SUMATRIPTAN SUCCINATE 50 MG PO TABS: ORAL_TABLET | ORAL | 0 refills | Status: AC

## 2024-05-12 ENCOUNTER — Telehealth: Payer: Self-pay

## 2024-05-12 ENCOUNTER — Other Ambulatory Visit (HOSPITAL_COMMUNITY): Payer: Self-pay

## 2024-05-12 NOTE — Telephone Encounter (Signed)
 Clinical questions have been answered and PA submitted. PA currently Pending. Please be advised that most companies allow up to 30 days to make a decision. We will advise when a determination has been made, or follow up in 1 week.   Please reach out to our team, Rx Prior Auth Pool, if you haven't heard back in a week.

## 2024-05-12 NOTE — Telephone Encounter (Signed)
 Pharmacy Patient Advocate Encounter   Received notification from Onbase that prior authorization for Ozempic  2 is due for renewal.   Insurance verification completed.   The patient is insured through [facility].  Action: PA required; PA started via CoverMyMeds. KEY [id number] . Waiting for clinical questions to populate.

## 2024-05-12 NOTE — Telephone Encounter (Signed)
 Pharmacy Patient Advocate Encounter  Received notification from [facility] that Prior Authorization for Ozempic  2 has been APPROVED from 05/12/24 to 05/12/25. Ran test claim, Copay is $0.00. This test claim was processed through [facility]- copay amounts may vary at other pharmacies due to pharmacy/plan contracts, or as the patient moves through the different stages of their insurance plan.   PA #/Case ID/Reference #: # [id number]

## 2024-05-17 ENCOUNTER — Other Ambulatory Visit: Payer: Self-pay

## 2024-05-17 ENCOUNTER — Ambulatory Visit
Admission: EM | Admit: 2024-05-17 | Discharge: 2024-05-17 | Disposition: A | Discharge status: Home / Self Care | Payer: MEDICAID | Attending: Family Medicine | Admitting: Family Medicine

## 2024-05-17 ENCOUNTER — Encounter: Payer: Self-pay | Admitting: Emergency Medicine

## 2024-05-17 DIAGNOSIS — J4521 Mild intermittent asthma with (acute) exacerbation: Secondary | ICD-10-CM

## 2024-05-17 DIAGNOSIS — J069 Acute upper respiratory infection, unspecified: Secondary | ICD-10-CM

## 2024-05-17 LAB — POC COVID19/FLU A&B COMBO
Covid Antigen, POC: NEGATIVE
Influenza A Antigen, POC: NEGATIVE
Influenza B Antigen, POC: NEGATIVE

## 2024-05-17 MED ORDER — PROMETHAZINE-DM 6.25-15 MG/5ML PO SYRP: 5.0000 mL | ORAL_SOLUTION | Freq: Four times a day (QID) | ORAL | 0 refills | Status: DC | PRN

## 2024-05-17 MED ORDER — AZELASTINE HCL 0.1 % NA SOLN: 1.0000 | Freq: Two times a day (BID) | NASAL | 0 refills | Status: AC

## 2024-05-17 MED ORDER — ALBUTEROL SULFATE HFA 108 (90 BASE) MCG/ACT IN AERS: 2.0000 | INHALATION_SPRAY | RESPIRATORY_TRACT | 0 refills | Status: AC | PRN

## 2024-05-17 NOTE — ED Provider Notes (Signed)
 [facility]    CSN: [id number] Arrival date & time: 05/17/24  9041      History   Chief Complaint Chief Complaint  Patient presents with   Cough    HPI Joan [name] is a 43 y.o. female.   Patient presenting today with several day history of cough, bilateral ear pain, fever, body aches, fatigue, congestion.  Denies chest pain, shortness of breath, abdominal pain, vomiting, diarrhea.  So far trying NyQuil with minimal relief.  Recent exposure to COVID.  History of asthma on albuterol  as needed but has not used since onset of symptoms.    Past Medical History:  Diagnosis Date   Acute asthma exacerbation 04/10/2018   Acute bacterial conjunctivitis of right eye 05/01/2023   Acute non-recurrent maxillary sinusitis 09/25/2018   Asthma    Asthma exacerbation 08/03/2020   Auditory hallucinations    only after anesthesia   BV (bacterial vaginosis) 02/13/2018   COVID-19 virus infection 08/16/2019   Diabetes mellitus without complication (HCC)    diet controlled   Diabetes mellitus, type II (HCC)    insulin , jardiance    Dyspnea    with exertion   Essential hypertension    Essential hypertension 11/24/2017   GERD (gastroesophageal reflux disease)    occasionally-NO MEDS   History of placement of ear tubes 05/20/2018   Hyperlipidemia    Kidney stone 02/11/2019   Localized skin mass, lump, or swelling 01/26/2021   MDD (major depressive disorder)    Miscarriage    Nausea & vomiting 10/05/2019   PTSD (post-traumatic stress disorder)    Right lower quadrant abdominal pain 02/13/2018   Tachycardia     Patient Active Problem List   Diagnosis Date Noted   Painful urination 04/12/2024   Preventative health care 10/29/2023   Environmental and seasonal allergies 10/03/2023   Achilles tendinitis, left leg 09/16/2023   Chronic heel pain, left 09/12/2023   Major depressive disorder, recurrent episode, moderate (HCC) 06/09/2023   Vertigo 05/29/2023   Bipolar 2  disorder, major depressive episode (HCC) 10/23/2022   Migraines 01/26/2021   GAD (generalized anxiety disorder) 10/24/2020   Asthma 11/03/2018   Recurrent sinusitis 05/20/2018   Tachycardia 05/04/2018   Type 2 diabetes mellitus with hyperglycemia (HCC) 11/24/2017   Essential hypertension 11/24/2017   Hyperlipidemia 11/24/2017   Major depressive disorder, recurrent, severe without psychotic features (HCC)    PTSD (post-traumatic stress disorder) 04/11/2015    Past Surgical History:  Procedure Laterality Date   ABDOMINAL HYSTERECTOMY     ADENOIDECTOMY     CHOLECYSTECTOMY  2009   CYSTOSCOPY N/A 07/31/2018   Procedure: CYSTOSCOPY;  Surgeon: Verdon Keen, MD;  Location: [facility];  Service: Gynecology;  Laterality: N/A;   LAPAROSCOPIC BILATERAL SALPINGECTOMY Bilateral 07/31/2018   Procedure: LAPAROSCOPIC BILATERAL SALPINGECTOMY;  Surgeon: Verdon Keen, MD;  Location: [facility];  Service: Gynecology;  Laterality: Bilateral;   LAPAROSCOPIC HYSTERECTOMY N/A 07/31/2018   Procedure: HYSTERECTOMY TOTAL LAPAROSCOPIC;  Surgeon: Verdon Keen, MD;  Location: [facility];  Service: Gynecology;  Laterality: N/A;   LAPAROSCOPY N/A 06/07/2019   Procedure: LAPAROSCOPY OPERATIVE, WITH PERITONEAL BIOPSIES;  Surgeon: Verdon Keen, MD;  Location: [facility];  Service: Gynecology;  Laterality: N/A;   LYSIS OF ADHESION N/A 07/31/2018   Procedure: LYSIS OF ADHESION;  Surgeon: Verdon Keen, MD;  Location: [facility];  Service: Gynecology;  Laterality: N/A;   OVARY SURGERY Right    cyst removed a while ago   TONSILLECTOMY     tubes in ear  OB History   No obstetric history on file.      Home Medications    Prior to Admission medications   Medication Sig Start Date End Date Taking? Authorizing Provider  azelastine  (ASTELIN ) 0.1 % nasal spray Place 1 spray into both nostrils 2 (two) times daily. Use in each nostril as directed 05/17/24  Yes Stuart Vernell Norris, PA-C   promethazine -dextromethorphan (PROMETHAZINE -DM) 6.25-15 MG/5ML syrup Take 5 mLs by mouth 4 (four) times daily as needed. 05/17/24  Yes Stuart Vernell Norris, PA-C  albuterol  (VENTOLIN  HFA) 108 [phone] Base) MCG/ACT inhaler Inhale 2 puffs into the lungs every 4 (four) hours as needed for wheezing or shortness of breath. 05/17/24   Stuart Vernell Norris, PA-C  atorvastatin  (LIPITOR) 20 MG tablet Take 1 tablet (20 mg total) by mouth daily. for cholesterol. 10/30/23   Clark, Katherine K, NP  azelastine  (ASTELIN ) 0.1 % nasal spray Place 2 sprays into both nostrils 2 (two) times daily. Use in each nostril as directed 12/08/23   Lorin Norris, MD  cetirizine  (ZYRTEC ) 10 MG tablet Take 1 tablet (10 mg total) by mouth daily. 12/08/23   Lorin Norris, MD  Continuous Glucose Receiver (DEXCOM G7 RECEIVER) DEVI Use to check blood sugars. 05/02/23   Gretta Comer POUR, NP  Continuous Glucose Sensor (FREESTYLE LIBRE 3 PLUS SENSOR) MISC Use to check blood sugar continuously. Change sensor every 15 days. 04/15/24   Clark, Katherine K, NP  EPINEPHrine  0.3 MG/0.3ML SOSY Inject 0.3 mLs (0.3 mg total) as directed as needed (use as directed for anaphalaxis reaction). 12/09/23   Lorin Norris, MD  fluticasone  (FLONASE ) 50 MCG/ACT nasal spray Place 1 spray into both nostrils 2 (two) times daily. 12/08/23   Lorin Norris, MD  fluticasone  (FLOVENT  HFA) 44 MCG/ACT inhaler Inhale 2 puffs into the lungs 2 (two) times daily. 12/08/23   Lorin Norris, MD  gabapentin  (NEURONTIN ) 100 MG capsule Take 1 capsule (100 mg total) by mouth 2 (two) times daily as needed. 03/10/24 06/08/24  Ezzard Staci SAILOR, NP  insulin  glargine (LANTUS ) 100 UNIT/ML Solostar Pen Inject 50 Units into the skin daily. for diabetes. 02/17/24   Clark, Katherine K, NP  Insulin  Pen Needle (PEN NEEDLES) 31G X 6 MM MISC Use nightly with insulin . 02/17/24   Clark, Katherine K, NP  mirtazapine  (REMERON ) 7.5 MG tablet Take 1 tablet (7.5 mg total) by mouth at bedtime. 02/16/24    Ezzard Staci SAILOR, NP  nitrofurantoin , macrocrystal-monohydrate, (MACROBID ) 100 MG capsule Take 1 capsule (100 mg total) by mouth 2 (two) times daily. 04/12/24   Cleatus Arlyss RAMAN, MD  propranolol  ER (INDERAL  LA) 80 MG 24 hr capsule Take 1 capsule (80 mg total) by mouth at bedtime. For headache prevention 05/01/23   Clark, Katherine K, NP  sertraline  (ZOLOFT ) 50 MG tablet Take 1 tablet (50 mg total) by mouth daily. 03/31/24   Ezzard Staci SAILOR, NP  SUMAtriptan  (IMITREX ) 50 MG tablet Take 1 tablet by mouth at migraine onset. May repeat in 2 hours if headache persists or recurs. 05/06/24   Clark, Katherine K, NP  tirzepatide  (MOUNJARO ) 12.5 MG/0.5ML Pen Inject 12.5 mg into the skin once a week. for diabetes. 04/29/24   Gretta Comer POUR, NP  valsartan  (DIOVAN ) 80 MG tablet Take 1 tablet (80 mg total) by mouth daily. for blood pressure. 10/30/23   Gretta Comer POUR, NP    Family History Family History  Problem Relation Age of Onset   Asthma Mother    Diabetes Mother    Hyperlipidemia Mother  Hypertension Mother    Diabetes Father    Hyperlipidemia Father    Hypertension Father    Diabetes Brother    Depression Brother    Alcohol abuse Brother    Depression Maternal Grandmother    Stroke Paternal Grandmother    Breast cancer Neg Hx     Social History Social History   Tobacco Use   Smoking status: Never    Passive exposure: Never   Smokeless tobacco: Never  Vaping Use   Vaping status: Never Used  Substance Use Topics   Alcohol use: No   Drug use: No     Allergies   Ibuprofen, Ciprofloxacin, Metformin  and related, Penicillins, Sulfa antibiotics, Other, Shellfish allergy , and Trazodone  and nefazodone   Review of Systems Review of Systems PER HPI  Physical Exam Triage Vital Signs ED Triage Vitals  Encounter Vitals Group     BP 05/17/24 1057 120/81     Girls Systolic BP Percentile --      Girls Diastolic BP Percentile --      Boys Systolic BP Percentile --      Boys Diastolic  BP Percentile --      Pulse Rate 05/17/24 1057 (!) 106     Resp 05/17/24 1057 20     Temp 05/17/24 1057 98.1 F (36.7 C)     Temp Source 05/17/24 1057 Oral     SpO2 --      Weight --      Height --      Head Circumference --      Peak Flow --      Pain Score 05/17/24 1058 8     Pain Loc --      Pain Education --      Exclude from Growth Chart --    No data found.  Updated Vital Signs BP 120/81 (BP Location: Right Arm)   Pulse (!) 106   Temp 98.1 F (36.7 C) (Oral)   Resp 20   LMP 07/24/2018 (Exact Date) Comment: surgery 07/31/2018  Visual Acuity Right Eye Distance:   Left Eye Distance:   Bilateral Distance:    Right Eye Near:   Left Eye Near:    Bilateral Near:     Physical Exam Vitals and nursing note reviewed.  Constitutional:      Appearance: Normal appearance.  HENT:     Head: Atraumatic.     Right Ear: Tympanic membrane and external ear normal.     Left Ear: Tympanic membrane and external ear normal.     Nose: Rhinorrhea present.     Mouth/Throat:     Mouth: Mucous membranes are moist.     Pharynx: Posterior oropharyngeal erythema present.  Eyes:     Extraocular Movements: Extraocular movements intact.     Conjunctiva/sclera: Conjunctivae normal.  Cardiovascular:     Rate and Rhythm: Normal rate and regular rhythm.     Heart sounds: Normal heart sounds.  Pulmonary:     Effort: Pulmonary effort is normal.     Breath sounds: Wheezing present.  Musculoskeletal:        General: Normal range of motion.     Cervical back: Normal range of motion and neck supple.  Skin:    General: Skin is warm and dry.  Neurological:     Mental Status: She is alert and oriented to person, place, and time.  Psychiatric:        Mood and Affect: Mood normal.        Thought  Content: Thought content normal.      UC Treatments / Results  Labs (all labs ordered are listed, but only abnormal results are displayed) Labs Reviewed  POC COVID19/FLU A&B COMBO     EKG   Radiology No results found.  Procedures Procedures (including critical care time)  Medications Ordered in UC Medications - No data to display  Initial Impression / Assessment and Plan / UC Course  I have reviewed the triage vital signs and the nursing notes.  Pertinent labs & imaging results that were available during my care of the patient were reviewed by me and considered in my medical decision making (see chart for details).     Suspect viral respiratory infection causing asthma exacerbation.  Treat with Phenergan  DM, albuterol , Astelin , supportive over-the-counter medications and home care.  Vitals and exam overall reassuring and she is well-appearing in no acute distress.  Return for worsening symptoms.  Final Clinical Impressions(s) / UC Diagnoses   Final diagnoses:  Viral URI with cough  Mild intermittent asthma with acute exacerbation   Discharge Instructions   None    ED Prescriptions     Medication Sig Dispense Auth. Provider   promethazine -dextromethorphan (PROMETHAZINE -DM) 6.25-15 MG/5ML syrup Take 5 mLs by mouth 4 (four) times daily as needed. 100 mL Stuart Vernell Norris, PA-C   azelastine  (ASTELIN ) 0.1 % nasal spray Place 1 spray into both nostrils 2 (two) times daily. Use in each nostril as directed 30 mL Stuart Vernell Norris, PA-C   albuterol  (VENTOLIN  HFA) 108 (90 Base) MCG/ACT inhaler Inhale 2 puffs into the lungs every 4 (four) hours as needed for wheezing or shortness of breath. 18 g Stuart Vernell Norris, [place]      PDMP not reviewed this encounter.   Stuart Vernell Norris, [place] 05/17/24 1709

## 2024-05-17 NOTE — ED Triage Notes (Signed)
 Pt reports cough, bilateral ear pain, intermittent fever/ body aches for last several days. Reports has tried nyquil and reports helps with sleep but no other improvement of symptoms. Has been exposed to covid.

## 2024-05-18 ENCOUNTER — Telehealth (HOSPITAL_COMMUNITY): Payer: MEDICAID | Admitting: Family

## 2024-05-18 DIAGNOSIS — F331 Major depressive disorder, recurrent, moderate: Secondary | ICD-10-CM

## 2024-05-18 DIAGNOSIS — F411 Generalized anxiety disorder: Secondary | ICD-10-CM | POA: Diagnosis not present

## 2024-05-18 MED ORDER — MIRTAZAPINE 7.5 MG PO TABS: 7.5000 mg | ORAL_TABLET | Freq: Every day | ORAL | 0 refills | Status: AC

## 2024-05-18 MED ORDER — GABAPENTIN 100 MG PO CAPS: 200.0000 mg | ORAL_CAPSULE | Freq: Two times a day (BID) | ORAL | 1 refills | Status: DC | PRN

## 2024-05-18 MED ORDER — SERTRALINE HCL 50 MG PO TABS: ORAL_TABLET | ORAL | 0 refills | Status: DC

## 2024-05-18 NOTE — Progress Notes (Signed)
 Virtual Visit via Video Note  I connected with Joan [name] on 05/18/24 at  3:30 PM EDT by a video enabled telemedicine application and verified that I am speaking with the correct person using two identifiers.  Location: Patient: Home Provider: Office   I discussed the limitations of evaluation and management by telemedicine and the availability of in person appointments. The patient expressed understanding and agreed to proceed.   I discussed the assessment and treatment plan with the patient. The patient was provided an opportunity to ask questions and all were answered. The patient agreed with the plan and demonstrated an understanding of the instructions.   The patient was advised to call back or seek an in-person evaluation if the symptoms worsen or if the condition fails to improve as anticipated.  I provided 18 minutes of non-face-to-face time during this encounter.   Joan LOISE Kerns, NP   [facility] MD/PA/NP OP Progress Note  05/18/2024 3:34 PM Joan [name]  MRN:  [id number]  Chief Complaint: Medication management   HPI: Joan [name] 43 year old female who presents for medication management.  Seen and evaluated via caregility virtual platform.  Blasa currently carries a diagnosis related to major depressive disorder, posttraumatic stress disorder bipolar, generalized anxiety disorder and sleep disturbance.  She is prescribed mirtazapine  7.5 mg, Zoloft  50 mg and gabapentin  100 mg twice daily.  Reports she has been taking and tolerating medications well.    Joan [name] continues to endorse increased anxiety mainly throughout the day.  Discussed titrating gabapentin  100 mg to 200 mg twice daily.  She was amendable to plan.  Reports she is currently studying psychology with a concentration and Christian counseling.  States she is enjoying classes.  States overall her worklife balance has improved.  States she is sleeping well throughout the night.  No other concerns noted at this  visit.  Discussed consideration with tapering Zoloft  to 100 mg and/or initiating mood stabilization medication if needed at follow-up appointment.  States her depression is currently under control.  Support and encouragement reassurance was provided.  Visit Diagnosis:    ICD-10-CM   1. Major depressive disorder, recurrent episode, moderate (HCC)  F33.1     2. GAD (generalized anxiety disorder)  F41.1       Past Psychiatric History:   Past Medical History:  Past Medical History:  Diagnosis Date   Acute asthma exacerbation 04/10/2018   Acute bacterial conjunctivitis of right eye 05/01/2023   Acute non-recurrent maxillary sinusitis 09/25/2018   Asthma    Asthma exacerbation 08/03/2020   Auditory hallucinations    only after anesthesia   BV (bacterial vaginosis) 02/13/2018   COVID-19 virus infection 08/16/2019   Diabetes mellitus without complication (HCC)    diet controlled   Diabetes mellitus, type II (HCC)    insulin , jardiance    Dyspnea    with exertion   Essential hypertension    Essential hypertension 11/24/2017   GERD (gastroesophageal reflux disease)    occasionally-NO MEDS   History of placement of ear tubes 05/20/2018   Hyperlipidemia    Kidney stone 02/11/2019   Localized skin mass, lump, or swelling 01/26/2021   MDD (major depressive disorder)    Miscarriage    Nausea & vomiting 10/05/2019   PTSD (post-traumatic stress disorder)    Right lower quadrant abdominal pain 02/13/2018   Tachycardia     Past Surgical History:  Procedure Laterality Date   ABDOMINAL HYSTERECTOMY     ADENOIDECTOMY     CHOLECYSTECTOMY  2009  CYSTOSCOPY N/A 07/31/2018   Procedure: CYSTOSCOPY;  Surgeon: Verdon Keen, MD;  Location: [facility];  Service: Gynecology;  Laterality: N/A;   LAPAROSCOPIC BILATERAL SALPINGECTOMY Bilateral 07/31/2018   Procedure: LAPAROSCOPIC BILATERAL SALPINGECTOMY;  Surgeon: Verdon Keen, MD;  Location: [facility];  Service: Gynecology;  Laterality:  Bilateral;   LAPAROSCOPIC HYSTERECTOMY N/A 07/31/2018   Procedure: HYSTERECTOMY TOTAL LAPAROSCOPIC;  Surgeon: Verdon Keen, MD;  Location: [facility];  Service: Gynecology;  Laterality: N/A;   LAPAROSCOPY N/A 06/07/2019   Procedure: LAPAROSCOPY OPERATIVE, WITH PERITONEAL BIOPSIES;  Surgeon: Verdon Keen, MD;  Location: [facility];  Service: Gynecology;  Laterality: N/A;   LYSIS OF ADHESION N/A 07/31/2018   Procedure: LYSIS OF ADHESION;  Surgeon: Verdon Keen, MD;  Location: [facility];  Service: Gynecology;  Laterality: N/A;   OVARY SURGERY Right    cyst removed a while ago   TONSILLECTOMY     tubes in ear      Family Psychiatric History:   Family History:  Family History  Problem Relation Age of Onset   Asthma Mother    Diabetes Mother    Hyperlipidemia Mother    Hypertension Mother    Diabetes Father    Hyperlipidemia Father    Hypertension Father    Diabetes Brother    Depression Brother    Alcohol abuse Brother    Depression Maternal Grandmother    Stroke Paternal Grandmother    Breast cancer Neg Hx     Social History:  Social History   Socioeconomic History   Marital status: Married    Spouse name: chip   Number of children: 0   Years of education: Not on file   Highest education level: 12th grade  Occupational History   Occupation: day care worker  Tobacco Use   Smoking status: Never    Passive exposure: Never   Smokeless tobacco: Never  Vaping Use   Vaping status: Never Used  Substance and Sexual Activity   Alcohol use: No   Drug use: No   Sexual activity: Yes    Birth control/protection: Surgical  Other Topics Concern   Not on file  Social History Narrative   Not on file   Social Drivers of Health   Financial Resource Strain: Low Risk  (04/27/2018)   Overall Financial Resource Strain (CARDIA)    Difficulty of Paying Living Expenses: Not hard at all  Food Insecurity: No Food Insecurity (12/16/2022)   Hunger Vital Sign    Worried About  Running Out of Food in the Last Year: Never true    Ran Out of Food in the Last Year: Never true  Transportation Needs: No Transportation Needs (12/16/2022)   PRAPARE - Administrator, Civil Service (Medical): No    Lack of Transportation (Non-Medical): No  Physical Activity: Insufficiently Active (11/29/2022)   Exercise Vital Sign    Days of Exercise per Week: 4 days    Minutes of Exercise per Session: 30 min  Stress: Stress Concern Present (11/29/2022)   [employer] of Occupational Health - Occupational Stress Questionnaire    Feeling of Stress : Rather much  Social Connections: Socially Integrated (11/29/2022)   Social Connection and Isolation Panel    Frequency of Communication with Friends and Family: Twice a week    Frequency of Social Gatherings with Friends and Family: Twice a week    Attends Religious Services: More than 4 times per year    Active Member of [employer] or Organizations: Yes    Attends [employer]  or Organization Meetings: More than 4 times per year    Marital Status: Married    Allergies:  Allergies  Allergen Reactions   Ibuprofen Swelling and Other (See Comments)    Facial    Ciprofloxacin Hives and Rash   Metformin  And Related Other (See Comments)    Elevated Lactic Acid   Penicillins Hives, Rash and Other (See Comments)    Has patient had a PCN reaction causing immediate rash, facial/tongue/throat swelling, SOB or lightheadedness with hypotension: No Has patient had a PCN reaction causing severe rash involving mucus membranes or skin necrosis: No Has patient had a PCN reaction that required hospitalization: No Has patient had a PCN reaction occurring within the last 10 years: No If all of the above answers are NO, then may proceed with Cephalosporin use. THE PATIENT IS ABLE TO TOLERATE CEPHALOSPORINS WITHOUT DIFFIC   Sulfa Antibiotics Hives and Rash   Other Other (See Comments)    ALL NUTS-SCRATCHY THROAT   Shellfish Allergy  Other (See Comments)     ALL SEAFOOD-SCRATCHY THROAT   Trazodone  And Nefazodone Other (See Comments)    Causes me to hear voices    Metabolic Disorder Labs: Lab Results  Component Value Date   HGBA1C 7.1 (A) 04/29/2024   MPG 315 10/22/2022   MPG 298 01/12/2021   Lab Results  Component Value Date   PROLACTIN 11.1 10/22/2022   PROLACTIN 14.5 05/05/2015   Lab Results  Component Value Date   CHOL 168 10/29/2023   TRIG 158.0 (H) 10/29/2023   HDL 51.50 10/29/2023   CHOLHDL 3 10/29/2023   VLDL 31.6 10/29/2023   LDLCALC 85 10/29/2023   LDLCALC 123 (H) 10/22/2022   Lab Results  Component Value Date   TSH 1.925 10/22/2022   TSH 1.211 08/03/2020    Therapeutic Level Labs: No results found for: LITHIUM No results found for: VALPROATE No results found for: CBMZ  Current Medications: Current Outpatient Medications  Medication Sig Dispense Refill   albuterol  (VENTOLIN  HFA) 108 (90 Base) MCG/ACT inhaler Inhale 2 puffs into the lungs every 4 (four) hours as needed for wheezing or shortness of breath. 18 g 0   atorvastatin  (LIPITOR) 20 MG tablet Take 1 tablet (20 mg total) by mouth daily. for cholesterol. 90 tablet 3   azelastine  (ASTELIN ) 0.1 % nasal spray Place 2 sprays into both nostrils 2 (two) times daily. Use in each nostril as directed 30 mL 12   azelastine  (ASTELIN ) 0.1 % nasal spray Place 1 spray into both nostrils 2 (two) times daily. Use in each nostril as directed 30 mL 0   cetirizine  (ZYRTEC ) 10 MG tablet Take 1 tablet (10 mg total) by mouth daily. 30 tablet 5   Continuous Glucose Receiver (DEXCOM G7 RECEIVER) DEVI Use to check blood sugars. 1 each 0   Continuous Glucose Sensor (FREESTYLE LIBRE 3 PLUS SENSOR) MISC Use to check blood sugar continuously. Change sensor every 15 days. 6 each 1   EPINEPHrine  0.3 MG/0.3ML SOSY Inject 0.3 mLs (0.3 mg total) as directed as needed (use as directed for anaphalaxis reaction). 2 each 1   fluticasone  (FLONASE ) 50 MCG/ACT nasal spray Place 1 spray  into both nostrils 2 (two) times daily. 16 g 2   fluticasone  (FLOVENT  HFA) 44 MCG/ACT inhaler Inhale 2 puffs into the lungs 2 (two) times daily. 31.8 g 12   gabapentin  (NEURONTIN ) 100 MG capsule Take 2 capsules (200 mg total) by mouth 2 (two) times daily as needed. 60 capsule 1   insulin   glargine (LANTUS ) 100 UNIT/ML Solostar Pen Inject 50 Units into the skin daily. for diabetes. 60 mL 0   Insulin  Pen Needle (PEN NEEDLES) 31G X 6 MM MISC Use nightly with insulin . 100 each 3   mirtazapine  (REMERON ) 7.5 MG tablet Take 1 tablet (7.5 mg total) by mouth at bedtime. 90 tablet 0   nitrofurantoin , macrocrystal-monohydrate, (MACROBID ) 100 MG capsule Take 1 capsule (100 mg total) by mouth 2 (two) times daily. 14 capsule 0   promethazine -dextromethorphan (PROMETHAZINE -DM) 6.25-15 MG/5ML syrup Take 5 mLs by mouth 4 (four) times daily as needed. 100 mL 0   propranolol  ER (INDERAL  LA) 80 MG 24 hr capsule Take 1 capsule (80 mg total) by mouth at bedtime. For headache prevention 90 capsule 1   sertraline  (ZOLOFT ) 50 MG tablet Take 1 tablet (50 mg total) by mouth daily. 90 tablet 0   SUMAtriptan  (IMITREX ) 50 MG tablet Take 1 tablet by mouth at migraine onset. May repeat in 2 hours if headache persists or recurs. 10 tablet 0   tirzepatide  (MOUNJARO ) 12.5 MG/0.5ML Pen Inject 12.5 mg into the skin once a week. for diabetes. 6 mL 0   valsartan  (DIOVAN ) 80 MG tablet Take 1 tablet (80 mg total) by mouth daily. for blood pressure. 90 tablet 3   No current facility-administered medications for this visit.     Musculoskeletal: Virtual platform  Psychiatric Specialty Exam: Review of Systems  Last menstrual period 07/24/2018.There is no height or weight on file to calculate BMI.  General Appearance: Casual  Eye Contact:  Good  Speech:  Clear and Coherent  Volume:  Normal  Mood:  Anxious  Affect:  Congruent  Thought Process:  Coherent  Orientation:  Full (Time, Place, and Person)  Thought Content: Logical    Suicidal Thoughts:  No  Homicidal Thoughts:  No  Memory:  Immediate;   Good Recent;   Good  Judgement:  Good  Insight:  Good  Psychomotor Activity:  Normal  Concentration:  Concentration: Good  Recall:  Good  Fund of Knowledge: Good  Language: Good  Akathisia:  No  Handed:  Right  AIMS (if indicated): not done  Assets:  Communication Skills Desire for Improvement  ADL's:  Intact  Cognition: WNL  Sleep:  Fair   Screenings: AIMS    Flowsheet Row [facility]  AIMS Total Score 0 0   AUDIT    Flowsheet Row [facility]  Alcohol Use Disorder Identification Test Final Score (AUDIT) 0 0 0   GAD-7    Flowsheet Row Office Visit from 04/29/2024 in [name] [place] Office Visit from 04/12/2024 in [facility] [place] Counselor from 11/24/2023 in [facility] [place] Counselor from 10/22/2023 in [name] from 09/24/2023 in [name]  Total GAD-7 Score 1 0 4 3 1    PHQ2-9    Flowsheet Row Office Visit from 04/29/2024 in [name] Visit from 04/12/2024 in [facility] [place] Counselor from 11/24/2023 in [facility] Visit from 10/29/2023 in [facility]  Nature conservation officer at [employer] from 10/22/2023 in [facility] Total Score 0 0 1 0 2   PHQ-9 Total Score 0 0 7 -- 5   Flowsheet Row UC from 05/17/2024 in [name] [place] UC from 03/30/2024 in [facility] [place] UC from 03/26/2024 in [facility])  C-SSRS RISK CATEGORY No Risk No Risk No Risk     Assessment and Plan: Ninetta Adelstein 43 year old female presents for medication management follow-up appointment.  Reports overall, her mood is stabilized however she continues to endorse struggling with anxiety symptoms mainly throughout the day.  Discussed increasing gabapentin  100 mg twice daily to 200 mg twice daily she was amendable to plan.  Discussed monitoring akathisia, restlessness, elevated blood pressure/ heart rate anything abnormal from her baseline.  She was receptive to plan.  Primary care provider prescribes propranolol .  Follow-up 3 months for medication adherence/tolerability.   Collaboration of Care: Collaboration of Care: Medication Management AEB patient to increase gabapentin  100 mg to 200 mg twice daily continue Zoloft  50 mg daily and continue mirtazapine  75 mg discussed signs and symptoms related to serotonin syndrome  Patient/Guardian was advised Release of Information must be obtained prior to any record release in order to collaborate their care with an outside provider. Patient/Guardian was advised if they have not already done so to contact the registration department to sign all necessary forms in order for us  to release information regarding their care.   Consent: Patient/Guardian gives verbal consent for treatment and assignment of benefits for services provided during this visit. Patient/Guardian expressed understanding and agreed to proceed.    Joan LOISE Kerns, NP 05/18/2024, 3:34 PM

## 2024-06-07 ENCOUNTER — Encounter: Payer: Self-pay | Admitting: Radiology

## 2024-06-08 ENCOUNTER — Telehealth (HOSPITAL_BASED_OUTPATIENT_CLINIC_OR_DEPARTMENT_OTHER): Payer: MEDICAID | Admitting: Family

## 2024-06-08 DIAGNOSIS — F3181 Bipolar II disorder: Secondary | ICD-10-CM

## 2024-06-08 DIAGNOSIS — F331 Major depressive disorder, recurrent, moderate: Secondary | ICD-10-CM | POA: Diagnosis not present

## 2024-06-08 MED ORDER — GABAPENTIN 100 MG PO CAPS: 100.0000 mg | ORAL_CAPSULE | Freq: Two times a day (BID) | ORAL | 1 refills | Status: DC

## 2024-06-08 MED ORDER — SERTRALINE HCL 100 MG PO TABS: ORAL_TABLET | ORAL | 1 refills | Status: DC

## 2024-06-08 NOTE — Progress Notes (Signed)
 Virtual Visit via Video Note  I connected with Joan [name] on 06/08/24 at  4:30 PM EST by a video enabled telemedicine application and verified that I am speaking with the correct person using two identifiers.  Location: Patient: Home Provider: Home Office   I discussed the limitations of evaluation and management by telemedicine and the availability of in person appointments. The patient expressed understanding and agreed to proceed.    I discussed the assessment and treatment plan with the patient. The patient was provided an opportunity to ask questions and all were answered. The patient agreed with the plan and demonstrated an understanding of the instructions.   The patient was advised to call back or seek an in-person evaluation if the symptoms worsen or if the condition fails to improve as anticipated.  I provided 15 minutes of non-face-to-face time during this encounter.   Staci LOISE Kerns, NP   [facility] MD/PA/NP OP Progress Note  06/08/2024 7:31 PM Joan [name]  MRN:  [id number]  Chief Complaint: Joan stated  I have been more tearful over the past few days.  HPI:  Joan [name] 43 year old Caucasian female who presents for medication management follow-up appointment.  States she requested earlier appointment due to worsening depression and anxiety symptoms.  Per previous assessment note discussed titrating gabapentin  100 mg to 200 mg twice daily for reports of increased anxiety.  Joan [name] reports new stressors as she is working 50 hours weekly coupled with restarting college classes for this semester.  Discussed titrating Zoloft  50 mg to 100 mg daily.  Will taper gabapentin  back down to 100 mg twice daily.  Follow-up 1 month for medication adherence/tolerability.  Related to decreased concentration and focus.  Discussed nonstimulant options however, would like to see her mood stabilized prior to initiating new medications.  She was amendable to plan.  Will follow-up with patient  regarding related to charted diagnosis bipolar 2 disorder.  Will discuss initiating Rexulti, Lamictal and/or Abilify  for mood stabilization   Visit Diagnosis:    ICD-10-CM   1. Major depressive disorder, recurrent episode, moderate (HCC)  F33.1     2. Bipolar 2 disorder (HCC)  F31.81       Past Psychiatric History:   Past Medical History:  Past Medical History:  Diagnosis Date   Acute asthma exacerbation 04/10/2018   Acute bacterial conjunctivitis of right eye 05/01/2023   Acute non-recurrent maxillary sinusitis 09/25/2018   Asthma    Asthma exacerbation 08/03/2020   Auditory hallucinations    only after anesthesia   BV (bacterial vaginosis) 02/13/2018   COVID-19 virus infection 08/16/2019   Diabetes mellitus without complication (HCC)    diet controlled   Diabetes mellitus, type II (HCC)    insulin , jardiance    Dyspnea    with exertion   Essential hypertension    Essential hypertension 11/24/2017   GERD (gastroesophageal reflux disease)    occasionally-NO MEDS   History of placement of ear tubes 05/20/2018   Hyperlipidemia    Kidney stone 02/11/2019   Localized skin mass, lump, or swelling 01/26/2021   MDD (major depressive disorder)    Miscarriage    Nausea & vomiting 10/05/2019   PTSD (post-traumatic stress disorder)    Right lower quadrant abdominal pain 02/13/2018   Tachycardia     Past Surgical History:  Procedure Laterality Date   ABDOMINAL HYSTERECTOMY     ADENOIDECTOMY     CHOLECYSTECTOMY  2009   CYSTOSCOPY N/A 07/31/2018   Procedure: CYSTOSCOPY;  Surgeon: Verdon Keen,  MD;  Location: [facility];  Service: Gynecology;  Laterality: N/A;   LAPAROSCOPIC BILATERAL SALPINGECTOMY Bilateral 07/31/2018   Procedure: LAPAROSCOPIC BILATERAL SALPINGECTOMY;  Surgeon: Verdon Keen, MD;  Location: [facility];  Service: Gynecology;  Laterality: Bilateral;   LAPAROSCOPIC HYSTERECTOMY N/A 07/31/2018   Procedure: HYSTERECTOMY TOTAL LAPAROSCOPIC;  Surgeon: Verdon Keen, MD;  Location: [facility];  Service: Gynecology;  Laterality: N/A;   LAPAROSCOPY N/A 06/07/2019   Procedure: LAPAROSCOPY OPERATIVE, WITH PERITONEAL BIOPSIES;  Surgeon: Verdon Keen, MD;  Location: [facility];  Service: Gynecology;  Laterality: N/A;   LYSIS OF ADHESION N/A 07/31/2018   Procedure: LYSIS OF ADHESION;  Surgeon: Verdon Keen, MD;  Location: [facility];  Service: Gynecology;  Laterality: N/A;   OVARY SURGERY Right    cyst removed a while ago   TONSILLECTOMY     tubes in ear      Family Psychiatric History:   Family History:  Family History  Problem Relation Age of Onset   Asthma Mother    Diabetes Mother    Hyperlipidemia Mother    Hypertension Mother    Diabetes Father    Hyperlipidemia Father    Hypertension Father    Diabetes Brother    Depression Brother    Alcohol abuse Brother    Depression Maternal Grandmother    Stroke Paternal Grandmother    Breast cancer Neg Hx     Social History:  Social History   Socioeconomic History   Marital status: Married    Spouse name: chip   Number of children: 0   Years of education: Not on file   Highest education level: 12th grade  Occupational History   Occupation: day care worker  Tobacco Use   Smoking status: Never    Passive exposure: Never   Smokeless tobacco: Never  Vaping Use   Vaping status: Never Used  Substance and Sexual Activity   Alcohol use: No   Drug use: No   Sexual activity: Yes    Birth control/protection: Surgical  Other Topics Concern   Not on file  Social History Narrative   Not on file   Social Drivers of Health   Financial Resource Strain: Low Risk  (04/27/2018)   Overall Financial Resource Strain (CARDIA)    Difficulty of Paying Living Expenses: Not hard at all  Food Insecurity: No Food Insecurity (12/16/2022)   Hunger Vital Sign    Worried About Running Out of Food in the Last Year: Never true    Ran Out of Food in the Last Year: Never true  Transportation Needs: No  Transportation Needs (12/16/2022)   PRAPARE - Administrator, Civil Service (Medical): No    Lack of Transportation (Non-Medical): No  Physical Activity: Insufficiently Active (11/29/2022)   Exercise Vital Sign    Days of Exercise per Week: 4 days    Minutes of Exercise per Session: 30 min  Stress: Stress Concern Present (11/29/2022)   [employer] of Occupational Health - Occupational Stress Questionnaire    Feeling of Stress : Rather much  Social Connections: Socially Integrated (11/29/2022)   Social Connection and Isolation Panel    Frequency of Communication with Friends and Family: Twice a week    Frequency of Social Gatherings with Friends and Family: Twice a week    Attends Religious Services: More than 4 times per year    Active Member of [employer] or Organizations: Yes    Attends Banker Meetings: More than 4 times per year  Marital Status: Married    Allergies:  Allergies  Allergen Reactions   Ibuprofen Swelling and Other (See Comments)    Facial    Ciprofloxacin Hives and Rash   Metformin  And Related Other (See Comments)    Elevated Lactic Acid   Penicillins Hives, Rash and Other (See Comments)    Has patient had a PCN reaction causing immediate rash, facial/tongue/throat swelling, SOB or lightheadedness with hypotension: No Has patient had a PCN reaction causing severe rash involving mucus membranes or skin necrosis: No Has patient had a PCN reaction that required hospitalization: No Has patient had a PCN reaction occurring within the last 10 years: No If all of the above answers are NO, then may proceed with Cephalosporin use. THE PATIENT IS ABLE TO TOLERATE CEPHALOSPORINS WITHOUT DIFFIC   Sulfa Antibiotics Hives and Rash   Other Other (See Comments)    ALL NUTS-SCRATCHY THROAT   Shellfish Allergy  Other (See Comments)    ALL SEAFOOD-SCRATCHY THROAT   Trazodone  And Nefazodone Other (See Comments)    Causes me to hear voices     Metabolic Disorder Labs: Lab Results  Component Value Date   HGBA1C 7.1 (A) 04/29/2024   MPG 315 10/22/2022   MPG 298 01/12/2021   Lab Results  Component Value Date   PROLACTIN 11.1 10/22/2022   PROLACTIN 14.5 05/05/2015   Lab Results  Component Value Date   CHOL 168 10/29/2023   TRIG 158.0 (H) 10/29/2023   HDL 51.50 10/29/2023   CHOLHDL 3 10/29/2023   VLDL 31.6 10/29/2023   LDLCALC 85 10/29/2023   LDLCALC 123 (H) 10/22/2022   Lab Results  Component Value Date   TSH 1.925 10/22/2022   TSH 1.211 08/03/2020    Therapeutic Level Labs: No results found for: LITHIUM No results found for: VALPROATE No results found for: CBMZ  Current Medications: Current Outpatient Medications  Medication Sig Dispense Refill   albuterol  (VENTOLIN  HFA) 108 (90 Base) MCG/ACT inhaler Inhale 2 puffs into the lungs every 4 (four) hours as needed for wheezing or shortness of breath. 18 g 0   atorvastatin  (LIPITOR) 20 MG tablet Take 1 tablet (20 mg total) by mouth daily. for cholesterol. 90 tablet 3   azelastine  (ASTELIN ) 0.1 % nasal spray Place 2 sprays into both nostrils 2 (two) times daily. Use in each nostril as directed 30 mL 12   azelastine  (ASTELIN ) 0.1 % nasal spray Place 1 spray into both nostrils 2 (two) times daily. Use in each nostril as directed 30 mL 0   cetirizine  (ZYRTEC ) 10 MG tablet Take 1 tablet (10 mg total) by mouth daily. 30 tablet 5   Continuous Glucose Receiver (DEXCOM G7 RECEIVER) DEVI Use to check blood sugars. 1 each 0   Continuous Glucose Sensor (FREESTYLE LIBRE 3 PLUS SENSOR) MISC Use to check blood sugar continuously. Change sensor every 15 days. 6 each 1   EPINEPHrine  0.3 MG/0.3ML SOSY Inject 0.3 mLs (0.3 mg total) as directed as needed (use as directed for anaphalaxis reaction). 2 each 1   fluticasone  (FLONASE ) 50 MCG/ACT nasal spray Place 1 spray into both nostrils 2 (two) times daily. 16 g 2   fluticasone  (FLOVENT  HFA) 44 MCG/ACT inhaler Inhale 2 puffs  into the lungs 2 (two) times daily. 31.8 g 12   gabapentin  (NEURONTIN ) 100 MG capsule Take 1 capsule (100 mg total) by mouth 2 (two) times daily. 60 capsule 1   insulin  glargine (LANTUS ) 100 UNIT/ML Solostar Pen Inject 50 Units into the skin daily. for  diabetes. 60 mL 0   Insulin  Pen Needle (PEN NEEDLES) 31G X 6 MM MISC Use nightly with insulin . 100 each 3   mirtazapine  (REMERON ) 7.5 MG tablet Take 1 tablet (7.5 mg total) by mouth at bedtime. 90 tablet 0   nitrofurantoin , macrocrystal-monohydrate, (MACROBID ) 100 MG capsule Take 1 capsule (100 mg total) by mouth 2 (two) times daily. 14 capsule 0   promethazine -dextromethorphan (PROMETHAZINE -DM) 6.25-15 MG/5ML syrup Take 5 mLs by mouth 4 (four) times daily as needed. 100 mL 0   propranolol  ER (INDERAL  LA) 80 MG 24 hr capsule Take 1 capsule (80 mg total) by mouth at bedtime. For headache prevention 90 capsule 1   sertraline  (ZOLOFT ) 100 MG tablet Take 1 tablet (50 mg total) by mouth daily. 60 tablet 1   SUMAtriptan  (IMITREX ) 50 MG tablet Take 1 tablet by mouth at migraine onset. May repeat in 2 hours if headache persists or recurs. 10 tablet 0   tirzepatide  (MOUNJARO ) 12.5 MG/0.5ML Pen Inject 12.5 mg into the skin once a week. for diabetes. 6 mL 0   valsartan  (DIOVAN ) 80 MG tablet Take 1 tablet (80 mg total) by mouth daily. for blood pressure. 90 tablet 3   No current facility-administered medications for this visit.     Musculoskeletal: Virtual platform  Psychiatric Specialty Exam: Review of Systems  Last menstrual period 07/24/2018.There is no height or weight on file to calculate BMI.  General Appearance: Casual  Eye Contact:  Good  Speech:  Clear and Coherent  Volume:  Normal  Mood:  Anxious and Depressed  Affect:  Congruent  Thought Process:  Coherent  Orientation:  Full (Time, Place, and Person)  Thought Content: Logical   Suicidal Thoughts:  No during this assessment however stated experiencing passive thoughts of suicidal  ideations denying plan or intent 2 days ago  Homicidal Thoughts:  No  Memory:  Immediate;   Good  Judgement:  Fair  Insight:  Fair  Psychomotor Activity:  Normal  Concentration:  Concentration: Poor  Recall:  Fair  Fund of Knowledge: Good  Language: Good  Akathisia:  No  Handed:  Right  AIMS (if indicated): not done  Assets:  Communication Skills Desire for Improvement  ADL's:  Intact  Cognition: WNL  Sleep:  Fair   Screenings: AIMS    Flowsheet Row [facility]  AIMS Total Score 0 0   AUDIT    Flowsheet Row [facility]  Alcohol Use Disorder Identification Test Final Score (AUDIT) 0 0 0   GAD-7    Flowsheet Row Office Visit from [facility]/29/2024 in [facility] Office Visit from 04/12/2024 in [facility] [place] [facility] [place] Counselor from 11/24/2023 in [facility] [place] Counselor from 10/22/2023 in [facility] from 09/24/2023 in [facility] at [facility]  Total GAD-7 Score 1 0 4 3 1    PHQ2-9    Flowsheet Row Office Visit from 04/29/2024 in [facility] Office Visit from 04/12/2024 in [facility] [place] Counselor from 11/24/2023 in [facility] at [facility] Visit from 10/29/2023 in  [facility] Counselor from 10/22/2023 in [facility] at [facility] Total Score 0 0 1 0  2  PHQ-9 Total Score 0 0 7 -- 5   Flowsheet Row UC from 05/17/2024 in [facility] [place] UC from 03/30/2024 in [facility] [place] UC from 03/26/2024 in [facility])  C-SSRS RISK CATEGORY No Risk No Risk No Risk     Assessment and Plan:   Collaboration of Care: Collaboration of Care: [facility] OP Collaboration of Care:[id number]}  Patient/Guardian was advised Release of Information must be obtained prior to any record release in order to collaborate their care with an outside provider. Patient/Guardian was advised if they have not already done so to contact the registration department to sign all necessary forms in order for us  to release information regarding their care.   Consent: Patient/Guardian gives verbal consent for treatment and assignment of benefits for services provided during this visit. Patient/Guardian expressed understanding and agreed to proceed.    Staci LOISE Kerns, NP 06/08/2024, 7:31 PM

## 2024-06-09 ENCOUNTER — Ambulatory Visit: Payer: MEDICAID | Admitting: Internal Medicine

## 2024-06-09 MED ORDER — LAMOTRIGINE 25 MG PO TABS
ORAL_TABLET | ORAL | 0 refills | Status: DC
Start: 2024-06-09 — End: 2024-06-30

## 2024-06-09 MED ORDER — ARIPIPRAZOLE 5 MG PO TABS: 5.0000 mg | ORAL_TABLET | Freq: Every day | ORAL | 1 refills | Status: DC

## 2024-06-09 MED ORDER — LAMOTRIGINE 25 MG PO TABS: 25.0000 mg | ORAL_TABLET | Freq: Every day | ORAL | 1 refills | Status: DC

## 2024-06-13 DIAGNOSIS — E1165 Type 2 diabetes mellitus with hyperglycemia: Secondary | ICD-10-CM

## 2024-06-13 DIAGNOSIS — G43009 Migraine without aura, not intractable, without status migrainosus: Secondary | ICD-10-CM

## 2024-06-14 MED ORDER — PROPRANOLOL HCL ER 80 MG PO CP24
80.0000 mg | ORAL_CAPSULE | Freq: Every day | ORAL | 0 refills | Status: AC
Start: 2024-06-14 — End: ?

## 2024-06-14 MED ORDER — INSULIN GLARGINE 100 UNIT/ML SOLOSTAR PEN: 50.0000 [IU] | PEN_INJECTOR | Freq: Every day | SUBCUTANEOUS | 0 refills | Status: DC

## 2024-06-30 ENCOUNTER — Telehealth (HOSPITAL_BASED_OUTPATIENT_CLINIC_OR_DEPARTMENT_OTHER): Payer: MEDICAID | Admitting: Family

## 2024-06-30 DIAGNOSIS — G479 Sleep disorder, unspecified: Secondary | ICD-10-CM | POA: Diagnosis not present

## 2024-06-30 DIAGNOSIS — F331 Major depressive disorder, recurrent, moderate: Secondary | ICD-10-CM

## 2024-06-30 DIAGNOSIS — R41840 Attention and concentration deficit: Secondary | ICD-10-CM | POA: Diagnosis not present

## 2024-06-30 MED ORDER — LAMOTRIGINE 25 MG PO TABS: 50.0000 mg | ORAL_TABLET | Freq: Every day | ORAL | 0 refills | Status: DC

## 2024-06-30 MED ORDER — ATOMOXETINE HCL 25 MG PO CAPS: 25.0000 mg | ORAL_CAPSULE | Freq: Every day | ORAL | 0 refills | Status: DC
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
## 2024-07-09 ENCOUNTER — Ambulatory Visit
Admission: RE | Admit: 2024-07-09 | Discharge: 2024-07-09 | Disposition: A | Discharge status: Home / Self Care | Source: Ambulatory Visit | Attending: Student

## 2024-07-09 ENCOUNTER — Other Ambulatory Visit: Payer: Self-pay

## 2024-07-09 VITALS — BP 116/81 | HR 89 | Temp 97.8°F | Resp 20

## 2024-07-09 DIAGNOSIS — J069 Acute upper respiratory infection, unspecified: Secondary | ICD-10-CM | POA: Diagnosis not present

## 2024-07-09 LAB — POC COVID19/FLU A&B COMBO
Covid Antigen, POC: NEGATIVE
Influenza A Antigen, POC: NEGATIVE
Influenza B Antigen, POC: NEGATIVE

## 2024-07-09 MED ORDER — PROMETHAZINE-DM 6.25-15 MG/5ML PO SYRP: 5.0000 mL | ORAL_SOLUTION | Freq: Four times a day (QID) | ORAL | 0 refills | Status: DC | PRN

## 2024-07-09 MED ORDER — BENZONATATE 100 MG PO CAPS: 100.0000 mg | ORAL_CAPSULE | Freq: Three times a day (TID) | ORAL | 0 refills | Status: DC

## 2024-07-09 NOTE — ED Provider Notes (Signed)
 [facility]    CSN: [id number] Arrival date & time: 07/09/24  1754      History   Chief Complaint Chief Complaint  Patient presents with   Cough    Entered by patient    HPI SHAELA BOER is a 43 y.o. female presenting w cough.  History of asthma, on albuterol  inhaler, cetirizine , Flonase , azelastine  nasal spray. Pt reports cough, chills, body aches, bilateral ear pain since this am. Has not monitored her her temp at home. Has not required albuterol  inhaler during present illness. Has tried tylenol  with minimal effect on symptoms, last dose 12 hours ago. Denies abd pain,emesis, diarrhea.   HPI  Past Medical History:  Diagnosis Date   Acute asthma exacerbation 04/10/2018   Acute bacterial conjunctivitis of right eye 05/01/2023   Acute non-recurrent maxillary sinusitis 09/25/2018   Asthma    Asthma exacerbation 08/03/2020   Auditory hallucinations    only after anesthesia   BV (bacterial vaginosis) 02/13/2018   COVID-19 virus infection 08/16/2019   Diabetes mellitus without complication (HCC)    diet controlled   Diabetes mellitus, type II (HCC)    insulin , jardiance    Dyspnea    with exertion   Essential hypertension    Essential hypertension 11/24/2017   GERD (gastroesophageal reflux disease)    occasionally-NO MEDS   History of placement of ear tubes 05/20/2018   Hyperlipidemia    Kidney stone 02/11/2019   Localized skin mass, lump, or swelling 01/26/2021   MDD (major depressive disorder)    Miscarriage    Nausea & vomiting 10/05/2019   PTSD (post-traumatic stress disorder)    Right lower quadrant abdominal pain 02/13/2018   Tachycardia     Patient Active Problem List   Diagnosis Date Noted   Painful urination 04/12/2024   Preventative health care 10/29/2023   Environmental and seasonal allergies 10/03/2023   Achilles tendinitis, left leg 09/16/2023   Chronic heel pain, left 09/12/2023   Major depressive disorder, recurrent episode, moderate  (HCC) 06/09/2023   Vertigo 05/29/2023   Bipolar 2 disorder, major depressive episode (HCC) 10/23/2022   Migraines 01/26/2021   GAD (generalized anxiety disorder) 10/24/2020   Asthma 11/03/2018   Recurrent sinusitis 05/20/2018   Tachycardia 05/04/2018   Type 2 diabetes mellitus with hyperglycemia (HCC) 11/24/2017   Essential hypertension 11/24/2017   Hyperlipidemia 11/24/2017   Major depressive disorder, recurrent, severe without psychotic features (HCC)    PTSD (post-traumatic stress disorder) 04/11/2015    Past Surgical History:  Procedure Laterality Date   ABDOMINAL HYSTERECTOMY     ADENOIDECTOMY     CHOLECYSTECTOMY  2009   CYSTOSCOPY N/A 07/31/2018   Procedure: CYSTOSCOPY;  Surgeon: Verdon Keen, MD;  Location: [facility];  Service: Gynecology;  Laterality: N/A;   LAPAROSCOPIC BILATERAL SALPINGECTOMY Bilateral 07/31/2018   Procedure: LAPAROSCOPIC BILATERAL SALPINGECTOMY;  Surgeon: Verdon Keen, MD;  Location: [facility];  Service: Gynecology;  Laterality: Bilateral;   LAPAROSCOPIC HYSTERECTOMY N/A 07/31/2018   Procedure: HYSTERECTOMY TOTAL LAPAROSCOPIC;  Surgeon: Verdon Keen, MD;  Location: [facility];  Service: Gynecology;  Laterality: N/A;   LAPAROSCOPY N/A 06/07/2019   Procedure: LAPAROSCOPY OPERATIVE, WITH PERITONEAL BIOPSIES;  Surgeon: Verdon Keen, MD;  Location: [facility];  Service: Gynecology;  Laterality: N/A;   LYSIS OF ADHESION N/A 07/31/2018   Procedure: LYSIS OF ADHESION;  Surgeon: Verdon Keen, MD;  Location: [facility];  Service: Gynecology;  Laterality: N/A;   OVARY SURGERY Right    cyst removed a while ago   TONSILLECTOMY  tubes in ear      OB History   No obstetric history on file.      Home Medications    Prior to Admission medications   Medication Sig Start Date End Date Taking? Authorizing Provider  benzonatate  (TESSALON ) 100 MG capsule Take 1 capsule (100 mg total) by mouth every 8 (eight) hours. 07/09/24  Yes Lillyian Heidt E, PA-C   promethazine -dextromethorphan (PROMETHAZINE -DM) 6.25-15 MG/5ML syrup Take 5 mLs by mouth 4 (four) times daily as needed for cough. 07/09/24  Yes Kelvis Berger E, PA-C  albuterol  (VENTOLIN  HFA) 108 (90 Base) MCG/ACT inhaler Inhale 2 puffs into the lungs every 4 (four) hours as needed for wheezing or shortness of breath. 05/17/24   Stuart Vernell Norris, PA-C  atomoxetine  (STRATTERA ) 25 MG capsule Take 1 capsule (25 mg total) by mouth daily. 06/30/24   Ezzard Staci SAILOR, NP  atorvastatin  (LIPITOR) 20 MG tablet Take 1 tablet (20 mg total) by mouth daily. for cholesterol. 10/30/23   Clark, Katherine K, NP  azelastine  (ASTELIN ) 0.1 % nasal spray Place 2 sprays into both nostrils 2 (two) times daily. Use in each nostril as directed 12/08/23   Lorin Norris, MD  azelastine  (ASTELIN ) 0.1 % nasal spray Place 1 spray into both nostrils 2 (two) times daily. Use in each nostril as directed 05/17/24   Stuart Vernell Norris, PA-C  cetirizine  (ZYRTEC ) 10 MG tablet Take 1 tablet (10 mg total) by mouth daily. 12/08/23   Lorin Norris, MD  Continuous Glucose Receiver (DEXCOM G7 RECEIVER) DEVI Use to check blood sugars. 05/02/23   Gretta Comer POUR, NP  Continuous Glucose Sensor (FREESTYLE LIBRE 3 PLUS SENSOR) MISC Use to check blood sugar continuously. Change sensor every 15 days. 04/15/24   Gretta Comer POUR, NP  EPINEPHrine  0.3 MG/0.3ML SOSY Inject 0.3 mLs (0.3 mg total) as directed as needed (use as directed for anaphalaxis reaction). 12/09/23   Lorin Norris, MD  fluticasone  (FLONASE ) 50 MCG/ACT nasal spray Place 1 spray into both nostrils 2 (two) times daily. 12/08/23   Lorin Norris, MD  fluticasone  (FLOVENT  HFA) 44 MCG/ACT inhaler Inhale 2 puffs into the lungs 2 (two) times daily. 12/08/23   Lorin Norris, MD  gabapentin  (NEURONTIN ) 100 MG capsule Take 1 capsule (100 mg total) by mouth 2 (two) times daily. 06/08/24 09/06/24  Ezzard Staci SAILOR, NP  insulin  glargine (LANTUS ) 100 UNIT/ML Solostar Pen Inject 50 Units  into the skin daily. for diabetes. 06/14/24   Clark, Katherine K, NP  Insulin  Pen Needle (PEN NEEDLES) 31G X 6 MM MISC Use nightly with insulin . 02/17/24   Clark, Katherine K, NP  lamoTRIgine  (LAMICTAL ) 25 MG tablet Take 2 tablets (50 mg total) by mouth daily. 06/30/24 08/29/24  Ezzard Staci SAILOR, NP  mirtazapine  (REMERON ) 7.5 MG tablet Take 1 tablet (7.5 mg total) by mouth at bedtime. 05/18/24   Ezzard Staci SAILOR, NP  propranolol  ER (INDERAL  LA) 80 MG 24 hr capsule Take 1 capsule (80 mg total) by mouth at bedtime. For headache prevention 06/14/24   Clark, Katherine K, NP  sertraline  (ZOLOFT ) 100 MG tablet Take 1 tablet (50 mg total) by mouth daily. 06/08/24   Ezzard Staci SAILOR, NP  SUMAtriptan  (IMITREX ) 50 MG tablet Take 1 tablet by mouth at migraine onset. May repeat in 2 hours if headache persists or recurs. 05/06/24   Clark, Katherine K, NP  tirzepatide  (MOUNJARO ) 12.5 MG/0.5ML Pen Inject 12.5 mg into the skin once a week. for diabetes. 04/29/24   Clark, Katherine K, NP  valsartan  (DIOVAN ) 80 MG tablet Take 1 tablet (80 mg total) by mouth daily. for blood pressure. 10/30/23   Gretta Comer POUR, NP    Family History Family History  Problem Relation Age of Onset   Asthma Mother    Diabetes Mother    Hyperlipidemia Mother    Hypertension Mother    Diabetes Father    Hyperlipidemia Father    Hypertension Father    Diabetes Brother    Depression Brother    Alcohol abuse Brother    Depression Maternal Grandmother    Stroke Paternal Grandmother    Breast cancer Neg Hx     Social History Social History   Tobacco Use   Smoking status: Never    Passive exposure: Never   Smokeless tobacco: Never  Vaping Use   Vaping status: Never Used  Substance Use Topics   Alcohol use: No   Drug use: No     Allergies   Ibuprofen, Ciprofloxacin, Metformin  and related, Penicillins, Sulfa antibiotics, Other, Shellfish allergy , and Trazodone  and nefazodone   Review of Systems Review of Systems   Constitutional:  Positive for chills. Negative for appetite change and fever.  HENT:  Negative for congestion, ear pain, rhinorrhea, sinus pressure, sinus pain and sore throat.   Eyes:  Negative for redness and visual disturbance.  Respiratory:  Positive for cough. Negative for chest tightness, shortness of breath and wheezing.   Cardiovascular:  Negative for chest pain and palpitations.  Gastrointestinal:  Negative for abdominal pain, constipation, diarrhea, nausea and vomiting.  Genitourinary:  Negative for dysuria, frequency and urgency.  Musculoskeletal:  Positive for myalgias.  Neurological:  Negative for dizziness, weakness and headaches.  Psychiatric/Behavioral:  Negative for confusion.   All other systems reviewed and are negative.    Physical Exam Triage Vital Signs ED Triage Vitals  Encounter Vitals Group     BP      Girls Systolic BP Percentile      Girls Diastolic BP Percentile      Boys Systolic BP Percentile      Boys Diastolic BP Percentile      Pulse      Resp      Temp      Temp src      SpO2      Weight      Height      Head Circumference      Peak Flow      Pain Score      Pain Loc      Pain Education      Exclude from Growth Chart    No data found.  Updated Vital Signs BP 116/81 (BP Location: Right Arm)   Pulse 89   Temp 97.8 F (36.6 C) (Oral)   Resp 20   LMP 07/24/2018 (Exact Date) Comment: surgery 07/31/2018  SpO2 97%   Visual Acuity Right Eye Distance:   Left Eye Distance:   Bilateral Distance:    Right Eye Near:   Left Eye Near:    Bilateral Near:     Physical Exam Vitals reviewed.  Constitutional:      General: She is not in acute distress.    Appearance: Normal appearance. She is not ill-appearing.  HENT:     Head: Normocephalic and atraumatic.     Right Ear: Tympanic membrane, ear canal and external ear normal. No tenderness. No middle ear effusion. There is no impacted cerumen. Tympanic membrane is not perforated,  erythematous, retracted or bulging.  Left Ear: Tympanic membrane, ear canal and external ear normal. No tenderness.  No middle ear effusion. There is no impacted cerumen. Tympanic membrane is not perforated, erythematous, retracted or bulging.     Nose: Nose normal. No congestion.     Mouth/Throat:     Mouth: Mucous membranes are moist.     Pharynx: Uvula midline. No oropharyngeal exudate or posterior oropharyngeal erythema.     Tonsils: No tonsillar exudate.     Comments: Mallampati IV Tonsils surgically absent. Eyes:     Extraocular Movements: Extraocular movements intact.     Pupils: Pupils are equal, round, and reactive to light.  Cardiovascular:     Rate and Rhythm: Normal rate and regular rhythm.     Heart sounds: Normal heart sounds.  Pulmonary:     Effort: Pulmonary effort is normal.     Breath sounds: Normal breath sounds. No decreased breath sounds, wheezing, rhonchi or rales.  Abdominal:     Palpations: Abdomen is soft.     Tenderness: There is no abdominal tenderness. There is no guarding or rebound.  Lymphadenopathy:     Cervical: No cervical adenopathy.     Right cervical: No superficial, deep or posterior cervical adenopathy.    Left cervical: No superficial, deep or posterior cervical adenopathy.  Skin:    Comments: No rash   Neurological:     General: No focal deficit present.     Mental Status: She is alert and oriented to person, place, and time.  Psychiatric:        Mood and Affect: Mood normal.        Behavior: Behavior normal.        Thought Content: Thought content normal.        Judgment: Judgment normal.      UC Treatments / Results  Labs (all labs ordered are listed, but only abnormal results are displayed) Labs Reviewed  POC COVID19/FLU A&B COMBO    EKG   Radiology No results found.  Procedures Procedures (including critical care time)  Medications Ordered in UC Medications - No data to display  Initial Impression / Assessment  and Plan / UC Course  I have reviewed the triage vital signs and the nursing notes.  Pertinent labs & imaging results that were available during my care of the patient were reviewed by me and considered in my medical decision making (see chart for details).     Patient is a pleasant 43 y.o. female presenting with viral URI with cough. The patient is afebrile and nontachycardic.  Last tylenol  12 hours ago.  She has a history of asthma.  On exam, there are no adventitious breath sounds.  She has not required her albuterol  inhaler during present illness.  -Covid negative -Influenza negative  Will manage symptomatically with Promethazine  DM, Tessalon , and albuterol  inhaler.  She declines refill on the albuterol .  Return precautions as below.  Final Clinical Impressions(s) / UC Diagnoses   Final diagnoses:  Viral URI with cough     Discharge Instructions      -Your COVID and influenza tests were negative. -You have a virus, like the common cold.  Viruses typically last 5 to 7 days.  After 7 days, your symptoms should be improving rather than worsening.  If your symptoms improve, and then worsen again, this is when we worry about a sinus infection or a lung infection, and you should return for additional care. -Promethazine  DM cough syrup for congestion/cough. This could make you drowsy, so  take at night before bed. -Tessalon  (Benzonatate ) as needed for cough. Take one pill up to 3x daily (every 8 hours) -Albuterol  inhaler that you have at home already as needed for cough, wheezing, shortness of breath, 1 to 2 puffs every 6 hours as needed. -Your cough should slowly get better instead of worse. If you develop a cough productive of dark or red sputum, new shortness of breath, new chest tightness, new fevers, etc - seek additional care      ED Prescriptions     Medication Sig Dispense Auth. Provider   promethazine -dextromethorphan (PROMETHAZINE -DM) 6.25-15 MG/5ML syrup Take 5 mLs by  mouth 4 (four) times daily as needed for cough. 118 mL Yusuke Beza E, PA-C   benzonatate  (TESSALON ) 100 MG capsule Take 1 capsule (100 mg total) by mouth every 8 (eight) hours. 21 capsule Catheryn Slifer E, PA-C      PDMP not reviewed this encounter.   Arlyss Leita BRAVO, PA-C 07/09/24 [phone]

## 2024-07-09 NOTE — ED Triage Notes (Signed)
 Pt reports cough, chills, body aches, bilateral ear pain since this am. Has tried tylenol  with minima effect on symptoms.

## 2024-07-09 NOTE — Discharge Instructions (Addendum)
-  Your COVID and influenza tests were negative. -You have a virus, like the common cold.  Viruses typically last 5 to 7 days.  After 7 days, your symptoms should be improving rather than worsening.  If your symptoms improve, and then worsen again, this is when we worry about a sinus infection or a lung infection, and you should return for additional care. -Promethazine  DM cough syrup for congestion/cough. This could make you drowsy, so take at night before bed. -Tessalon  (Benzonatate ) as needed for cough. Take one pill up to 3x daily (every 8 hours) -Albuterol  inhaler that you have at home already as needed for cough, wheezing, shortness of breath, 1 to 2 puffs every 6 hours as needed. -Your cough should slowly get better instead of worse. If you develop a cough productive of dark or red sputum, new shortness of breath, new chest tightness, new fevers, etc - seek additional care

## 2024-07-17 DIAGNOSIS — E1165 Type 2 diabetes mellitus with hyperglycemia: Secondary | ICD-10-CM

## 2024-07-19 MED ORDER — MOUNJARO 12.5 MG/0.5ML ~~LOC~~ SOAJ: 12.5000 mg | SUBCUTANEOUS | 0 refills | Status: DC

## 2024-07-26 ENCOUNTER — Ambulatory Visit: Payer: Self-pay | Admitting: Primary Care

## 2024-07-26 ENCOUNTER — Encounter: Payer: Self-pay | Admitting: Primary Care

## 2024-07-26 ENCOUNTER — Ambulatory Visit (INDEPENDENT_AMBULATORY_CARE_PROVIDER_SITE_OTHER): Payer: MEDICAID | Admitting: Primary Care

## 2024-07-26 VITALS — BP 112/72 | HR 84 | Temp 98.5°F | Ht 66.0 in | Wt 296.2 lb

## 2024-07-26 DIAGNOSIS — Z794 Long term (current) use of insulin: Secondary | ICD-10-CM

## 2024-07-26 DIAGNOSIS — E1165 Type 2 diabetes mellitus with hyperglycemia: Secondary | ICD-10-CM | POA: Diagnosis not present

## 2024-07-26 DIAGNOSIS — Z23 Encounter for immunization: Secondary | ICD-10-CM

## 2024-07-26 LAB — POCT GLYCOSYLATED HEMOGLOBIN (HGB A1C): Hemoglobin A1C: 8.2 % — AB (ref 4.0–5.6)

## 2024-07-26 MED ORDER — MOUNJARO 15 MG/0.5ML ~~LOC~~ SOAJ: 15.0000 mg | SUBCUTANEOUS | 0 refills | Status: AC

## 2024-07-26 NOTE — Patient Instructions (Signed)
 We increased your Mounjaro  to 15 mg weekly for diabetes.  Continue Lantus  at 54 units daily for now.  Please schedule a physical to meet with me in 3 months.   It was a pleasure to see you today!

## 2024-07-26 NOTE — Addendum Note (Signed)
 Addended by: ALBINO SHAVER C on: 07/26/2024 08:34 AM   Modules accepted: Orders

## 2024-07-26 NOTE — Assessment & Plan Note (Signed)
 Deteriorated with A1C of 8.2 today.  Discussed options.  Would like to ultimately wean her off Lantus  due to obesity and weight gain.   Continue Lanuts at 54 units for now. Increase Mounjaro  to 15 mg weekly.  Will update pneumonia vaccine. Foot exam today.  Follow up in 3 months.
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
## 2024-07-27 ENCOUNTER — Other Ambulatory Visit (HOSPITAL_COMMUNITY): Payer: Self-pay

## 2024-07-27 ENCOUNTER — Telehealth (HOSPITAL_BASED_OUTPATIENT_CLINIC_OR_DEPARTMENT_OTHER): Payer: MEDICAID | Admitting: Family

## 2024-07-27 DIAGNOSIS — R41840 Attention and concentration deficit: Secondary | ICD-10-CM | POA: Diagnosis not present

## 2024-07-27 DIAGNOSIS — F331 Major depressive disorder, recurrent, moderate: Secondary | ICD-10-CM | POA: Diagnosis not present

## 2024-07-27 MED ORDER — ATOMOXETINE HCL 40 MG PO CAPS: 40.0000 mg | ORAL_CAPSULE | Freq: Every day | ORAL | 0 refills | Status: DC
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
## 2024-07-27 NOTE — Telephone Encounter (Signed)
 Pls submit PA for Mounjaro  15 mg.

## 2024-07-28 ENCOUNTER — Telehealth: Payer: Self-pay

## 2024-07-28 ENCOUNTER — Other Ambulatory Visit (HOSPITAL_COMMUNITY): Payer: Self-pay

## 2024-07-28 NOTE — Telephone Encounter (Signed)
 Pharmacy Patient Advocate Encounter   Received notification from Patient Advice Request messages that prior authorization for Mounjaro  15MG /0.5ML auto-injectors  is required/requested.   Insurance verification completed.   The patient is insured through [facility]   Per test claim: PA required and submitted KEY/EOC/Request #: [id number] due to:  Your PA has been resolved, no additional PA is required.      Per test claim: The current 28 day co-pay is, $4.  No PA needed at this time. This test claim was processed through [facility]- copay amounts may vary at other pharmacies due to pharmacy/plan contracts, or as the patient moves through the different stages of their insurance plan.

## 2024-07-30 ENCOUNTER — Ambulatory Visit: Payer: MEDICAID | Admitting: Primary Care

## 2024-07-30 NOTE — Telephone Encounter (Addendum)
 Noted (see other message by clicking blue '[id number] [place]' link).  Fyi to [facility].

## 2024-07-30 NOTE — Telephone Encounter (Signed)
 Called patient let her know she will need to call pharmacy and ask them to fill the 15mg  due to change. If any questions or issues she will call our office back. I tried to call pharmacy but they are closed for lunch.

## 2024-08-02 ENCOUNTER — Emergency Department (HOSPITAL_BASED_OUTPATIENT_CLINIC_OR_DEPARTMENT_OTHER)
Admission: EM | Admit: 2024-08-02 | Discharge: 2024-08-02 | Disposition: A | Discharge status: Home / Self Care | Attending: Emergency Medicine | Admitting: Emergency Medicine

## 2024-08-02 ENCOUNTER — Encounter (HOSPITAL_BASED_OUTPATIENT_CLINIC_OR_DEPARTMENT_OTHER): Payer: Self-pay | Admitting: Emergency Medicine

## 2024-08-02 ENCOUNTER — Other Ambulatory Visit: Payer: Self-pay

## 2024-08-02 ENCOUNTER — Emergency Department (HOSPITAL_BASED_OUTPATIENT_CLINIC_OR_DEPARTMENT_OTHER): Admitting: Radiology

## 2024-08-02 DIAGNOSIS — E119 Type 2 diabetes mellitus without complications: Secondary | ICD-10-CM | POA: Insufficient documentation

## 2024-08-02 DIAGNOSIS — Z7985 Long-term (current) use of injectable non-insulin antidiabetic drugs: Secondary | ICD-10-CM | POA: Insufficient documentation

## 2024-08-02 DIAGNOSIS — M25532 Pain in left wrist: Secondary | ICD-10-CM | POA: Insufficient documentation

## 2024-08-02 DIAGNOSIS — Z794 Long term (current) use of insulin: Secondary | ICD-10-CM | POA: Diagnosis not present

## 2024-08-02 MED ORDER — OXYCODONE-ACETAMINOPHEN 5-325 MG PO TABS
1.0000 | ORAL_TABLET | Freq: Once | ORAL | Status: AC
  Administered 2024-08-02: 1 via ORAL
  Filled 2024-08-02: qty 1

## 2024-08-02 NOTE — ED Provider Notes (Signed)
 " [place] EMERGENCY DEPARTMENT AT [facility] Provider Note   CSN: [id number] Arrival date & time: 08/02/24  1017     Patient presents with: Joan [name] is a 43 y.o. female.   Patient presents to the emergency department today for evaluation of left wrist injury.  Patient seen by myself on arrival to triage.  Pt complains of left wrist and arm pain.  Patient states that she was on the toilet around 3 AM.  Her left feet fell asleep and when she stood up she tripped, striking the left wrist and forearm area area on a bathtub.  She did not hit her head.  No treatments prior to arrival.       Prior to Admission medications  Medication Sig Start Date End Date Taking? Authorizing Provider  ARIPiprazole  (ABILIFY ) 5 MG tablet Take 5 mg by mouth daily. 06/10/24  Yes [provider]  albuterol  (VENTOLIN  HFA) 108 (90 Base) MCG/ACT inhaler Inhale 2 puffs into the lungs every 4 (four) hours as needed for wheezing or shortness of breath. 05/17/24   Stuart Vernell Norris, PA-C  atomoxetine  (STRATTERA ) 40 MG capsule Take 1 capsule (40 mg total) by mouth daily. 07/27/24   Ezzard Staci SAILOR, NP  atorvastatin  (LIPITOR) 20 MG tablet Take 1 tablet (20 mg total) by mouth daily. for cholesterol. 10/30/23   Clark, Katherine K, NP  azelastine  (ASTELIN ) 0.1 % nasal spray Place 2 sprays into both nostrils 2 (two) times daily. Use in each nostril as directed 12/08/23   Lorin Norris, MD  azelastine  (ASTELIN ) 0.1 % nasal spray Place 1 spray into both nostrils 2 (two) times daily. Use in each nostril as directed 05/17/24   Stuart Vernell Norris, PA-C  cetirizine  (ZYRTEC ) 10 MG tablet Take 1 tablet (10 mg total) by mouth daily. 12/08/23   Lorin Norris, MD  Continuous Glucose Receiver (DEXCOM G7 RECEIVER) DEVI Use to check blood sugars. 05/02/23   Gretta Comer POUR, NP  Continuous Glucose Sensor (FREESTYLE LIBRE 3 PLUS SENSOR) MISC Use to check blood sugar continuously. Change sensor  every 15 days. 04/15/24   Gretta Comer POUR, NP  EPINEPHrine  0.3 MG/0.3ML SOSY Inject 0.3 mLs (0.3 mg total) as directed as needed (use as directed for anaphalaxis reaction). 12/09/23   Lorin Norris, MD  fluticasone  (FLONASE ) 50 MCG/ACT nasal spray Place 1 spray into both nostrils 2 (two) times daily. 12/08/23   Lorin Norris, MD  fluticasone  (FLOVENT  HFA) 44 MCG/ACT inhaler Inhale 2 puffs into the lungs 2 (two) times daily. 12/08/23   Lorin Norris, MD  gabapentin  (NEURONTIN ) 100 MG capsule Take 1 capsule (100 mg total) by mouth 2 (two) times daily. 06/08/24 09/06/24  Ezzard Staci SAILOR, NP  insulin  glargine (LANTUS ) 100 UNIT/ML Solostar Pen Inject 50 Units into the skin daily. for diabetes. 06/14/24   Clark, Katherine K, NP  Insulin  Pen Needle (PEN NEEDLES) 31G X 6 MM MISC Use nightly with insulin . 02/17/24   Clark, Katherine K, NP  mirtazapine  (REMERON ) 7.5 MG tablet Take 1 tablet (7.5 mg total) by mouth at bedtime. 05/18/24   Ezzard Staci SAILOR, NP  propranolol  ER (INDERAL  LA) 80 MG 24 hr capsule Take 1 capsule (80 mg total) by mouth at bedtime. For headache prevention 06/14/24   Clark, Katherine K, NP  sertraline  (ZOLOFT ) 100 MG tablet Take 1 tablet (50 mg total) by mouth daily. 06/08/24   Ezzard Staci SAILOR, NP  SUMAtriptan  (IMITREX ) 50 MG tablet Take 1 tablet by mouth at migraine  onset. May repeat in 2 hours if headache persists or recurs. 05/06/24   Clark, Katherine K, NP  tirzepatide  (MOUNJARO ) 15 MG/0.5ML Pen Inject 15 mg into the skin once a week. for diabetes. 07/26/24   Clark, Katherine K, NP  valsartan  (DIOVAN ) 80 MG tablet Take 1 tablet (80 mg total) by mouth daily. for blood pressure. 10/30/23   Clark, Katherine K, NP    Allergies: Ibuprofen, Ciprofloxacin, Metformin  and related, Penicillins, Sulfa antibiotics, Other, Shellfish allergy , and Trazodone  and nefazodone    Review of Systems  Updated Vital Signs BP (!) 133/93 (BP Location: Right Arm)   Pulse 97   Temp 98.1 F (36.7 C) (Oral)    Resp 18   Wt 134.3 kg   LMP 07/24/2018 Comment: surgery 07/31/2018  SpO2 98%   BMI 47.78 kg/m   Physical Exam Vitals and nursing note reviewed.  Constitutional:      Appearance: She is well-developed.  HENT:     Head: Normocephalic and atraumatic.  Eyes:     Pupils: Pupils are equal, round, and reactive to light.  Cardiovascular:     Pulses: Normal pulses. No decreased pulses.  Musculoskeletal:        General: Tenderness present.     Left elbow: Normal range of motion. No tenderness.     Left forearm: Tenderness (Distal) present.     Left wrist: Tenderness present. No snuff box tenderness. Decreased range of motion.     Left hand: No tenderness or bony tenderness. Normal range of motion.     Cervical back: Normal range of motion and neck supple.  Skin:    General: Skin is warm and dry.  Neurological:     Mental Status: She is alert.     Sensory: No sensory deficit.     Comments: Motor, sensation, and vascular distal to the injury is fully intact.   Psychiatric:        Mood and Affect: Mood normal.     (all labs ordered are listed, but only abnormal results are displayed) Labs Reviewed - No data to display  EKG: None  Radiology: DG Wrist Complete Left Result Date: 08/02/2024 EXAM: 3 or more view(s) Xray of the left wrist 08/02/2024 11:55:00 AM COMPARISON: None available. CLINICAL HISTORY: Fall, wrist pain FINDINGS: BONES AND JOINTS: No acute fracture. No malalignment. SOFT TISSUES: The soft tissues are unremarkable. IMPRESSION: 1. No significant abnormality. Electronically signed by: Franky Crease MD 08/02/2024 01:36 PM EST RP Workstation: [id number]     Procedures   Medications Ordered in the ED - No data to display  ED Course  Patient seen and examined. History obtained directly from patient. Work-up including labs, imaging, EKG ordered in triage, if performed, were reviewed.    Labs/EKG: None ordered  Imaging: Independently reviewed and interpreted.  This  included: X-ray of the left wrist agree negative  Medications/Fluids: None ordered  Most recent vital signs reviewed and are as follows: BP (!) 133/93 (BP Location: Right Arm)   Pulse 97   Temp 98.1 F (36.7 C) (Oral)   Resp 18   Wt 134.3 kg   LMP 07/24/2018 Comment: surgery 07/31/2018  SpO2 98%   BMI 47.78 kg/m   Initial impression: Left wrist contusion versus sprain.  Will provide with Velcro wrist splint.  Home treatment plan: RICE protocol, OTC medications  Return instructions discussed with patient: New or worsening symptoms  Follow-up instructions discussed with patient: Orthopedist in 1 week if not improving  Medical Decision Making Amount and/or Complexity of Data Reviewed Radiology: ordered.   Patient with mechanical/accidental fall this morning impacting left wrist.  This is likely a contusion versus a sprain.  Imaging was negative.  Distal sensation and circulation appears intact.  No elbow or shoulder injury suspected.  She did not hit her head.  Splinting, RICE protocol, OTC meds indicated with orthopedic follow-up if not improving after a week.     Final diagnoses:  Left wrist pain    ED Discharge Orders     None          Desiderio Chew, PA-C 08/02/24 1452  "

## 2024-08-02 NOTE — ED Triage Notes (Signed)
 Pt endorses mechanical fall after feet fell asleep when on toilet. Pt c/o LT wrist pain. Deneis head injury or thinners

## 2024-08-02 NOTE — Discharge Instructions (Signed)
 Please read and follow all provided instructions.  Your diagnoses today include:  1. Left wrist pain     Tests performed today include: An x-ray of your wrist - does NOT show any broken bones Vital signs. See below for your results today.   Medications prescribed:  Please use over-the-counter NSAID medications (ibuprofen, naproxen ) or Tylenol  (acetaminophen ) as directed on the packaging for pain -- as long as you do not have any reasons avoid these medications. Reasons to avoid NSAID medications include: weak kidneys, a history of bleeding in your stomach or gut, or uncontrolled high blood pressure or previous heart attack. Reasons to avoid Tylenol  include: liver problems or ongoing alcohol use. Never take more than 4000mg  or 8 Extra strength Tylenol  in a 24 hour period.     Take any prescribed medications only as directed.  Home care instructions:  Follow any educational materials contained in this packet Wear your splint for at least one week or until seen by a physician for a follow-up examination. Follow R.I.C.E. Protocol: R - rest your injury  I  - use ice on injury without applying directly to skin C - compress injury with bandage or splint E - elevate the injury above the level of your heart as much as possible to reduce pain and swelling  Follow-up instructions: Please follow-up with your primary care provider or the provided orthopedic (bone specialist) if you continue to have significant pain or trouble using your wrist in 1 week. In this case you may have a severe injury that requires further care.   Generally, when wrists are moderately tender to touch following a fall or injury, a fracture (break in bone) may be present. Because of this, even if your x-rays were normal today, it is important that you receive follow-up care as suggested (you could still have a broken bone).  Return instructions:  Please return if your fingers are numb or tingling, appear very red, white,  gray or blue, or you have severe pain (also elevate wrist and loosen splint or wrap) Please return if you have difficulty moving your fingers. Please return to the Emergency Department if you experience worsening symptoms.  Please return if you have any other emergent concerns.  Additional Information:  Your vital signs today were: BP (!) 133/93 (BP Location: Right Arm)   Pulse 97   Temp 98.1 F (36.7 C) (Oral)   Resp 18   Wt 134.3 kg   LMP 07/24/2018 Comment: surgery 07/31/2018  SpO2 98%   BMI 47.78 kg/m  If your blood pressure (BP) was elevated above 135/85 this visit, please have this repeated by your doctor within one month. -------------- Wrist injuries are frequent in adults and children. A sprain is an injury to the ligaments that hold your bones together. A strain is an injury to muscle or muscle tendons (cord like structure) from stretching or pulling.   Remember the importance of follow-up and possible follow-up x-rays. Improvement in pain level is not 100% insurance of not having a fracture. --------------

## 2024-08-02 NOTE — ED Provider Triage Note (Signed)
 Emergency Medicine Provider Triage Evaluation Note  Joan [name] , a 43 y.o. female  was evaluated in triage.  Pt complains of left wrist and arm pain.  Patient states that she was on the toilet around 3 AM.  Her left feet fell asleep and when she stood up she tripped, hitting the left wrist area on a bathtub.  She did not hit her head.  No treatments prior to arrival.  Review of Systems  Positive: Wrist pain Negative: Headache  Physical Exam  BP (!) 133/93 (BP Location: Right Arm)   Pulse 97   Temp 98.1 F (36.7 C) (Oral)   Resp 18   Wt 134.3 kg   LMP 07/24/2018 Comment: surgery 07/31/2018  SpO2 98%   BMI 47.78 kg/m  Gen:   Awake, no distress   Resp:  Normal effort  MSK:   Moves extremities without difficulty  Other:  No deformity, mild swelling over the left wrist and distal forearm, pain with movement, distal circulation intact  Medical Decision Making  Medically screening exam initiated at 11:01 AM.  Appropriate orders placed.  Joan [name] was informed that the remainder of the evaluation will be completed by another provider, this initial triage assessment does not replace that evaluation, and the importance of remaining in the ED until their evaluation is complete.  X-ray ordered.   Desiderio Chew, PA-C 08/02/24 1102

## 2024-08-02 NOTE — ED Notes (Signed)

## 2024-08-03 ENCOUNTER — Ambulatory Visit (HOSPITAL_BASED_OUTPATIENT_CLINIC_OR_DEPARTMENT_OTHER)

## 2024-08-03 ENCOUNTER — Ambulatory Visit (INDEPENDENT_AMBULATORY_CARE_PROVIDER_SITE_OTHER): Admitting: Student

## 2024-08-03 DIAGNOSIS — M79602 Pain in left arm: Secondary | ICD-10-CM

## 2024-08-03 DIAGNOSIS — M79632 Pain in left forearm: Secondary | ICD-10-CM

## 2024-08-03 MED ORDER — TRAMADOL HCL 50 MG PO TABS: 50.0000 mg | ORAL_TABLET | Freq: Four times a day (QID) | ORAL | 0 refills | Status: AC | PRN

## 2024-08-03 NOTE — Progress Notes (Signed)
 "                                Chief Complaint: Left arm injury    Discussed the use of AI scribe software for clinical note transcription with the patient, who gave verbal consent to proceed.  History of Present Illness Joan [name] is a 43 year old left-hand dominant female who presents with acute left forearm pain following a fall.  On August 02, 2024, she fell while rising from the commode, landing on the ground after bracing with her left hand against the bathtub. Since then she has had severe pain localized to the mid left forearm, worst where the wrist brace contacts, and worsened by wrist and thumb motion and forearm pronation and supination. She has nausea, numbness and tightness in her fingers, swelling, and difficulty making a fist. She denies pain at the elbow or ulnar forearm. She is using an ED-provided wrist brace but finds it uncomfortable. She takes acetaminophen  1,000 mg every 4-6 hours for pain and cannot take NSAIDs due to allergy . She also takes gabapentin . 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 She had a left forearm fracture in childhood treated with casting and no recent trauma to that area before this injury.    Surgical History:   None  PMH/PSH/Family History/Social History/Meds/Allergies:    Past Medical History:  Diagnosis Date   Acute asthma exacerbation 04/10/2018   Acute bacterial conjunctivitis of right eye 05/01/2023   Acute non-recurrent maxillary sinusitis 09/25/2018   Asthma    Asthma exacerbation 08/03/2020   Auditory hallucinations    only after anesthesia   BV (bacterial vaginosis) 02/13/2018   COVID-19 virus infection 08/16/2019   Diabetes mellitus without complication (HCC)    diet controlled   Diabetes mellitus, type II (HCC)    insulin , jardiance    Dyspnea    with exertion   Essential hypertension    Essential hypertension 11/24/2017    GERD (gastroesophageal reflux disease)    occasionally-NO MEDS   History of placement of ear tubes 05/20/2018   Hyperlipidemia    Kidney stone 02/11/2019   Localized skin mass, lump, or swelling 01/26/2021   MDD (major depressive disorder)    Miscarriage    Nausea & vomiting 10/05/2019   PTSD (post-traumatic stress disorder)    Right lower quadrant abdominal pain 02/13/2018   Tachycardia    Past Surgical History:  Procedure Laterality Date   ABDOMINAL HYSTERECTOMY     ADENOIDECTOMY     CHOLECYSTECTOMY  2009   CYSTOSCOPY N/A 07/31/2018   Procedure: CYSTOSCOPY;  Surgeon: Verdon Keen, MD;  Location: [facility];  Service: Gynecology;  Laterality: N/A;   LAPAROSCOPIC BILATERAL SALPINGECTOMY Bilateral 07/31/2018   Procedure: LAPAROSCOPIC BILATERAL SALPINGECTOMY;  Surgeon: Verdon Keen, MD;  Location: [facility];  Service: Gynecology;  Laterality: Bilateral;   LAPAROSCOPIC HYSTERECTOMY N/A 07/31/2018   Procedure: HYSTERECTOMY TOTAL LAPAROSCOPIC;  Surgeon: Verdon Keen, MD;  Location: [facility];  Service: Gynecology;  Laterality: N/A;   LAPAROSCOPY N/A 06/07/2019   Procedure: LAPAROSCOPY OPERATIVE, WITH PERITONEAL BIOPSIES;  Surgeon: Verdon Keen, MD;  Location: [facility];  Service: Gynecology;  Laterality: N/A;   LYSIS OF ADHESION N/A 07/31/2018   Procedure: LYSIS OF ADHESION;  Surgeon: Verdon Keen, MD;  Location: [facility];  Service: Gynecology;  Laterality: N/A;   OVARY SURGERY Right  cyst removed a while ago   TONSILLECTOMY     tubes in ear     Social History   Socioeconomic History   Marital status: Married    Spouse name: chip   Number of children: 0   Years of education: Not on file   Highest education level: 12th grade  Occupational History   Occupation: day care worker  Tobacco Use   Smoking status: Never    Passive exposure: Never   Smokeless tobacco: Never  Vaping Use   Vaping status: Never Used  Substance and Sexual Activity   Alcohol use: No    Drug use: No   Sexual activity: Yes    Birth control/protection: Surgical  Other Topics Concern   Not on file  Social History Narrative   Not on file   Social Drivers of Health   Tobacco Use: Low Risk (08/02/2024)   Patient History    Smoking Tobacco Use: Never    Smokeless Tobacco Use: Never    Passive Exposure: Never  Financial Resource Strain: Not on file  Food Insecurity: No Food Insecurity (12/16/2022)   Hunger Vital Sign    Worried About Running Out of Food in the Last Year: Never true    Ran Out of Food in the Last Year: Never true  Transportation Needs: No Transportation Needs (12/16/2022)   PRAPARE - Administrator, Civil Service (Medical): No    Lack of Transportation (Non-Medical): No  Physical Activity: Insufficiently Active (11/29/2022)   Exercise Vital Sign    Days of Exercise per Week: 4 days    Minutes of Exercise per Session: 30 min  Stress: Stress Concern Present (11/29/2022)   [employer] of Occupational Health - Occupational Stress Questionnaire    Feeling of Stress : Rather much  Social Connections: Socially Integrated (11/29/2022)   Social Connection and Isolation Panel    Frequency of Communication with Friends and Family: Twice a week    Frequency of Social Gatherings with Friends and Family: Twice a week    Attends Religious Services: More than 4 times per year    Active Member of Clubs or Organizations: Yes    Attends Banker Meetings: More than 4 times per year    Marital Status: Married  Depression (PHQ2-9): Low Risk (04/29/2024)   Depression (PHQ2-9)    PHQ-2 Score: 0  Alcohol Screen: Low Risk (10/22/2022)   Alcohol Screen    Last Alcohol Screening Score (AUDIT): 0  Housing: Low Risk (11/29/2022)   Housing    Last Housing Risk Score: 0  Utilities: Not At Risk (10/23/2022)   AHC Utilities    Threatened with loss of utilities: No  Health Literacy: Not on file   Family History  Problem Relation Age of Onset   Asthma  Mother    Diabetes Mother    Hyperlipidemia Mother    Hypertension Mother    Diabetes Father    Hyperlipidemia Father    Hypertension Father    Diabetes Brother    Depression Brother    Alcohol abuse Brother    Depression Maternal Grandmother    Stroke Paternal Grandmother    Breast cancer Neg Hx    Allergies[1] Current Outpatient Medications  Medication Sig Dispense Refill   albuterol  (VENTOLIN  HFA) 108 (90 Base) MCG/ACT inhaler Inhale 2 puffs into the lungs every 4 (four) hours as needed for wheezing or shortness of breath. 18 g 0   ARIPiprazole  (ABILIFY ) 5 MG tablet Take 5 mg by  mouth daily.     atomoxetine  (STRATTERA ) 40 MG capsule Take 1 capsule (40 mg total) by mouth daily. 60 capsule 0   atorvastatin  (LIPITOR) 20 MG tablet Take 1 tablet (20 mg total) by mouth daily. for cholesterol. 90 tablet 3   azelastine  (ASTELIN ) 0.1 % nasal spray Place 2 sprays into both nostrils 2 (two) times daily. Use in each nostril as directed 30 mL 12   azelastine  (ASTELIN ) 0.1 % nasal spray Place 1 spray into both nostrils 2 (two) times daily. Use in each nostril as directed 30 mL 0   cetirizine  (ZYRTEC ) 10 MG tablet Take 1 tablet (10 mg total) by mouth daily. 30 tablet 5   Continuous Glucose Receiver (DEXCOM G7 RECEIVER) DEVI Use to check blood sugars. 1 each 0   Continuous Glucose Sensor (FREESTYLE LIBRE 3 PLUS SENSOR) MISC Use to check blood sugar continuously. Change sensor every 15 days. 6 each 1   EPINEPHrine  0.3 MG/0.3ML SOSY Inject 0.3 mLs (0.3 mg total) as directed as needed (use as directed for anaphalaxis reaction). 2 each 1   fluticasone  (FLONASE ) 50 MCG/ACT nasal spray Place 1 spray into both nostrils 2 (two) times daily. 16 g 2   fluticasone  (FLOVENT  HFA) 44 MCG/ACT inhaler Inhale 2 puffs into the lungs 2 (two) times daily. 31.8 g 12   gabapentin  (NEURONTIN ) 100 MG capsule Take 1 capsule (100 mg total) by mouth 2 (two) times daily. 60 capsule 1   insulin  glargine (LANTUS ) 100 UNIT/ML  Solostar Pen Inject 50 Units into the skin daily. for diabetes. 60 mL 0   Insulin  Pen Needle (PEN NEEDLES) 31G X 6 MM MISC Use nightly with insulin . 100 each 3   mirtazapine  (REMERON ) 7.5 MG tablet Take 1 tablet (7.5 mg total) by mouth at bedtime. 90 tablet 0   propranolol  ER (INDERAL  LA) 80 MG 24 hr capsule Take 1 capsule (80 mg total) by mouth at bedtime. For headache prevention 90 capsule 0   sertraline  (ZOLOFT ) 100 MG tablet Take 1 tablet (50 mg total) by mouth daily. 60 tablet 1   SUMAtriptan  (IMITREX ) 50 MG tablet Take 1 tablet by mouth at migraine onset. May repeat in 2 hours if headache persists or recurs. 10 tablet 0   tirzepatide  (MOUNJARO ) 15 MG/0.5ML Pen Inject 15 mg into the skin once a week. for diabetes. 6 mL 0   valsartan  (DIOVAN ) 80 MG tablet Take 1 tablet (80 mg total) by mouth daily. for blood pressure. 90 tablet 3   No current facility-administered medications for this visit.   DG Wrist Complete Left Result Date: 08/02/2024 EXAM: 3 or more view(s) Xray of the left wrist 08/02/2024 11:55:00 AM COMPARISON: None available. CLINICAL HISTORY: Fall, wrist pain FINDINGS: BONES AND JOINTS: No acute fracture. No malalignment. SOFT TISSUES: The soft tissues are unremarkable. IMPRESSION: 1. No significant abnormality. Electronically signed by: Franky Crease MD 08/02/2024 01:36 PM EST RP Workstation: [id number]    Review of Systems:   A ROS was performed including pertinent positives and negatives as documented in the HPI.  Physical Exam :   Constitutional: NAD and appears stated age Neurological: Alert and oriented Psych: Appropriate affect and cooperative Last menstrual period 07/24/2018.   Comprehensive Musculoskeletal Exam:    Exam of the left wrist and forearm demonstrates no obvious deformity.  Tenderness with palpation in the forearm predominantly over the distal radial shaft.  No snuffbox, DRUJ, or pinpoint carpal tenderness.  Mild swelling throughout the hand and digits.   Patient not quite able  to form a composite fist.  Elbow is nontender with full range of motion.  Distal neurosensory exam intact.  Imaging:   Xray (left forearm 2 views): Negative for acute fracture or dislocation   I personally reviewed and interpreted the radiographs.      Assessment & Plan Left forearm injury Patient experiences radial sided forearm pain after FOOSH injury yesterday.  Radiographs confirmed no fracture, indicating a soft tissue injury. Due to occupational risks and to promote healing, immobilization is recommended. A short arm cast was applied after discussing the options for immobilization. Cast care instructions discussed. Tramadol  was prescribed for pain management, particularly at night, with continued acetaminophen  as needed. Anti-inflammatories are avoided due to her allergy . A follow-up is scheduled in 2-3 weeks for reassessment and likely cast removal, or sooner if symptoms change.       I personally saw and evaluated the patient, and participated in the management and treatment plan.  Leonce Reveal, PA-C Orthopedics    [1]  Allergies Allergen Reactions   Ibuprofen Swelling and Other (See Comments)    Facial    Ciprofloxacin Hives and Rash   Metformin  And Related Other (See Comments)    Elevated Lactic Acid   Penicillins Hives, Rash and Other (See Comments)    Has patient had a PCN reaction causing immediate rash, facial/tongue/throat swelling, SOB or lightheadedness with hypotension: No Has patient had a PCN reaction causing severe rash involving mucus membranes or skin necrosis: No Has patient had a PCN reaction that required hospitalization: No Has patient had a PCN reaction occurring within the last 10 years: No If all of the above answers are NO, then may proceed with Cephalosporin use. THE PATIENT IS ABLE TO TOLERATE CEPHALOSPORINS WITHOUT DIFFIC   Sulfa Antibiotics Hives and Rash   Other Other (See Comments)    ALL NUTS-SCRATCHY  THROAT   Shellfish Allergy  Other (See Comments)    ALL SEAFOOD-SCRATCHY THROAT   Trazodone  And Nefazodone Other (See Comments)    Causes me to hear voices   "

## 2024-08-04 ENCOUNTER — Encounter (HOSPITAL_BASED_OUTPATIENT_CLINIC_OR_DEPARTMENT_OTHER): Payer: Self-pay

## 2024-08-06 ENCOUNTER — Ambulatory Visit
Admission: RE | Admit: 2024-08-06 | Discharge: 2024-08-06 | Disposition: A | Discharge status: Home / Self Care | Payer: MEDICAID | Source: Ambulatory Visit | Attending: Primary Care | Admitting: Primary Care

## 2024-08-06 DIAGNOSIS — Z1231 Encounter for screening mammogram for malignant neoplasm of breast: Secondary | ICD-10-CM | POA: Diagnosis present

## 2024-08-09 ENCOUNTER — Ambulatory Visit: Payer: Self-pay | Admitting: Primary Care

## 2024-08-11 ENCOUNTER — Ambulatory Visit (INDEPENDENT_AMBULATORY_CARE_PROVIDER_SITE_OTHER): Admitting: Student

## 2024-08-11 ENCOUNTER — Ambulatory Visit: Admitting: Primary Care

## 2024-08-11 DIAGNOSIS — M654 Radial styloid tenosynovitis [de Quervain]: Secondary | ICD-10-CM | POA: Diagnosis not present

## 2024-08-11 DIAGNOSIS — M79632 Pain in left forearm: Secondary | ICD-10-CM | POA: Diagnosis not present

## 2024-08-11 NOTE — Progress Notes (Signed)
 "                                Chief Complaint: Left arm injury    History of Present Illness  08/10/24: Patient presents today for follow-up of her left forearm.  She states that recently her cast has started feeling loose and has been rubbing on the dorsum of her left hand which has become quite bothersome.  She states that symptoms are gradually improving although she is experiencing some radial sided discomfort and has been unable to work due to having the cast.   08/03/24: Joan [name] is a 44 year old left-hand dominant female who presents with acute left forearm pain following a fall. On August 02, 2024, she fell while rising from the commode, landing on the ground after bracing with her left hand against the bathtub. Since then she has had severe pain localized to the mid left forearm, worst where the wrist brace contacts, and worsened by wrist and thumb motion and forearm pronation and supination. She has nausea, numbness and tightness in her fingers, swelling, and difficulty making a fist. She denies pain at the elbow or ulnar forearm. She is using an ED-provided wrist brace but finds it uncomfortable. She takes acetaminophen  1,000 mg every 4-6 hours for pain and cannot take NSAIDs due to allergy . She also takes gabapentin . 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 She had a left forearm fracture in childhood treated with casting and no recent trauma to that area before this injury.    Surgical History:   None  PMH/PSH/Family History/Social History/Meds/Allergies:    Past Medical History:  Diagnosis Date   Acute asthma exacerbation 04/10/2018   Acute bacterial conjunctivitis of right eye 05/01/2023   Acute non-recurrent maxillary sinusitis 09/25/2018   Asthma    Asthma exacerbation 08/03/2020   Auditory hallucinations    only after anesthesia   BV (bacterial vaginosis) 02/13/2018    COVID-19 virus infection 08/16/2019   Diabetes mellitus without complication (HCC)    diet controlled   Diabetes mellitus, type II (HCC)    insulin , jardiance    Dyspnea    with exertion   Essential hypertension    Essential hypertension 11/24/2017   GERD (gastroesophageal reflux disease)    occasionally-NO MEDS   History of placement of ear tubes 05/20/2018   Hyperlipidemia    Kidney stone 02/11/2019   Localized skin mass, lump, or swelling 01/26/2021   MDD (major depressive disorder)    Miscarriage    Nausea & vomiting 10/05/2019   PTSD (post-traumatic stress disorder)    Right lower quadrant abdominal pain 02/13/2018   Tachycardia    Past Surgical History:  Procedure Laterality Date   ABDOMINAL HYSTERECTOMY     ADENOIDECTOMY     CHOLECYSTECTOMY  2009   CYSTOSCOPY N/A 07/31/2018   Procedure: CYSTOSCOPY;  Surgeon: Verdon Keen, MD;  Location: [facility];  Service: Gynecology;  Laterality: N/A;   LAPAROSCOPIC BILATERAL SALPINGECTOMY Bilateral 07/31/2018   Procedure: LAPAROSCOPIC BILATERAL SALPINGECTOMY;  Surgeon: Verdon Keen, MD;  Location: [facility];  Service: Gynecology;  Laterality: Bilateral;   LAPAROSCOPIC HYSTERECTOMY N/A 07/31/2018   Procedure: HYSTERECTOMY TOTAL LAPAROSCOPIC;  Surgeon: Verdon Keen, MD;  Location: [facility];  Service: Gynecology;  Laterality: N/A;   LAPAROSCOPY N/A 06/07/2019   Procedure: LAPAROSCOPY OPERATIVE, WITH PERITONEAL BIOPSIES;  Surgeon:  Verdon Keen, MD;  Location: [facility];  Service: Gynecology;  Laterality: N/A;   LYSIS OF ADHESION N/A 07/31/2018   Procedure: LYSIS OF ADHESION;  Surgeon: Verdon Keen, MD;  Location: [facility];  Service: Gynecology;  Laterality: N/A;   OVARY SURGERY Right    cyst removed a while ago   TONSILLECTOMY     tubes in ear     Social History   Socioeconomic History   Marital status: Married    Spouse name: chip   Number of children: 0   Years of education: Not on file   Highest education  level: 12th grade  Occupational History   Occupation: day care worker  Tobacco Use   Smoking status: Never    Passive exposure: Never   Smokeless tobacco: Never  Vaping Use   Vaping status: Never Used  Substance and Sexual Activity   Alcohol use: No   Drug use: No   Sexual activity: Yes    Birth control/protection: Surgical  Other Topics Concern   Not on file  Social History Narrative   Not on file   Social Drivers of Health   Tobacco Use: Low Risk (08/02/2024)   Patient History    Smoking Tobacco Use: Never    Smokeless Tobacco Use: Never    Passive Exposure: Never  Financial Resource Strain: Not on file  Food Insecurity: No Food Insecurity (12/16/2022)   Hunger Vital Sign    Worried About Running Out of Food in the Last Year: Never true    Ran Out of Food in the Last Year: Never true  Transportation Needs: No Transportation Needs (12/16/2022)   PRAPARE - Administrator, Civil Service (Medical): No    Lack of Transportation (Non-Medical): No  Physical Activity: Insufficiently Active (11/29/2022)   Exercise Vital Sign    Days of Exercise per Week: 4 days    Minutes of Exercise per Session: 30 min  Stress: Stress Concern Present (11/29/2022)   [employer] of Occupational Health - Occupational Stress Questionnaire    Feeling of Stress : Rather much  Social Connections: Socially Integrated (11/29/2022)   Social Connection and Isolation Panel    Frequency of Communication with Friends and Family: Twice a week    Frequency of Social Gatherings with Friends and Family: Twice a week    Attends Religious Services: More than 4 times per year    Active Member of Clubs or Organizations: Yes    Attends Banker Meetings: More than 4 times per year    Marital Status: Married  Depression (PHQ2-9): Low Risk (04/29/2024)   Depression (PHQ2-9)    PHQ-2 Score: 0  Alcohol Screen: Low Risk (10/22/2022)   Alcohol Screen    Last Alcohol Screening Score (AUDIT):  0  Housing: Low Risk (11/29/2022)   Housing    Last Housing Risk Score: 0  Utilities: Not At Risk (10/23/2022)   AHC Utilities    Threatened with loss of utilities: No  Health Literacy: Not on file   Family History  Problem Relation Age of Onset   Asthma Mother    Diabetes Mother    Hyperlipidemia Mother    Hypertension Mother    Diabetes Father    Hyperlipidemia Father    Hypertension Father    Diabetes Brother    Depression Brother    Alcohol abuse Brother    Depression Maternal Grandmother    Stroke Paternal Grandmother    Breast cancer Neg Hx    Allergies[1] Current  Outpatient Medications  Medication Sig Dispense Refill   albuterol  (VENTOLIN  HFA) 108 (90 Base) MCG/ACT inhaler Inhale 2 puffs into the lungs every 4 (four) hours as needed for wheezing or shortness of breath. 18 g 0   ARIPiprazole  (ABILIFY ) 5 MG tablet Take 5 mg by mouth daily.     atomoxetine  (STRATTERA ) 40 MG capsule Take 1 capsule (40 mg total) by mouth daily. 60 capsule 0   atorvastatin  (LIPITOR) 20 MG tablet Take 1 tablet (20 mg total) by mouth daily. for cholesterol. 90 tablet 3   azelastine  (ASTELIN ) 0.1 % nasal spray Place 2 sprays into both nostrils 2 (two) times daily. Use in each nostril as directed 30 mL 12   azelastine  (ASTELIN ) 0.1 % nasal spray Place 1 spray into both nostrils 2 (two) times daily. Use in each nostril as directed 30 mL 0   cetirizine  (ZYRTEC ) 10 MG tablet Take 1 tablet (10 mg total) by mouth daily. 30 tablet 5   Continuous Glucose Receiver (DEXCOM G7 RECEIVER) DEVI Use to check blood sugars. 1 each 0   Continuous Glucose Sensor (FREESTYLE LIBRE 3 PLUS SENSOR) MISC Use to check blood sugar continuously. Change sensor every 15 days. 6 each 1   EPINEPHrine  0.3 MG/0.3ML SOSY Inject 0.3 mLs (0.3 mg total) as directed as needed (use as directed for anaphalaxis reaction). 2 each 1   fluticasone  (FLONASE ) 50 MCG/ACT nasal spray Place 1 spray into both nostrils 2 (two) times daily. 16 g 2    fluticasone  (FLOVENT  HFA) 44 MCG/ACT inhaler Inhale 2 puffs into the lungs 2 (two) times daily. 31.8 g 12   gabapentin  (NEURONTIN ) 100 MG capsule Take 1 capsule (100 mg total) by mouth 2 (two) times daily. 60 capsule 1   insulin  glargine (LANTUS ) 100 UNIT/ML Solostar Pen Inject 50 Units into the skin daily. for diabetes. 60 mL 0   Insulin  Pen Needle (PEN NEEDLES) 31G X 6 MM MISC Use nightly with insulin . 100 each 3   mirtazapine  (REMERON ) 7.5 MG tablet Take 1 tablet (7.5 mg total) by mouth at bedtime. 90 tablet 0   propranolol  ER (INDERAL  LA) 80 MG 24 hr capsule Take 1 capsule (80 mg total) by mouth at bedtime. For headache prevention 90 capsule 0   sertraline  (ZOLOFT ) 100 MG tablet Take 1 tablet (50 mg total) by mouth daily. 60 tablet 1   SUMAtriptan  (IMITREX ) 50 MG tablet Take 1 tablet by mouth at migraine onset. May repeat in 2 hours if headache persists or recurs. 10 tablet 0   tirzepatide  (MOUNJARO ) 15 MG/0.5ML Pen Inject 15 mg into the skin once a week. for diabetes. 6 mL 0   valsartan  (DIOVAN ) 80 MG tablet Take 1 tablet (80 mg total) by mouth daily. for blood pressure. 90 tablet 3   No current facility-administered medications for this visit.   No results found.   Review of Systems:   A ROS was performed including pertinent positives and negatives as documented in the HPI.  Physical Exam :   Constitutional: NAD and appears stated age Neurological: Alert and oriented Psych: Appropriate affect and cooperative Last menstrual period 07/24/2018.   Comprehensive Musculoskeletal Exam:    Physical exam after cast removal demonstrates tenderness palpation along the first dorsal compartment without notable swelling or warmth.  Positive Finkelstein's.  No tenderness in the anatomical snuffbox.  Minimal discomfort with wrist flexion and extension.  Able to form a composite fist.  Distal sensation intact.  Imaging:  Assessment & Plan Left forearm injury Patient is now just over  1 week status post FOOSH injury to the left wrist and forearm.  X-rays showed no evidence of significant bony abnormality, however she was placed in a short arm cast for immobilization due to occupational hazards.  She has recently been developing some discomfort from the cast likely as a result of decrease in swelling.  Her job is not allowing her to work while in the cast but she is also currently in a transition into a new job.  Given the amount of cast discomfort in the absence of a fracture, I have recommended cast removal which was performed today.  Exam of the wrist demonstrates more radial sided tenderness today which does appear consistent with de Quervain's tenosynovitis.  She was transitioned into a soft thumb spica brace and instructed to follow-up within 4 weeks if symptoms persisting could consider an injection if needed at that time.  Otherwise she can follow-up as needed.       I personally saw and evaluated the patient, and participated in the management and treatment plan.  Leonce Reveal, PA-C Orthopedics     [1]  Allergies Allergen Reactions   Ibuprofen Swelling and Other (See Comments)    Facial    Ciprofloxacin Hives and Rash   Metformin  And Related Other (See Comments)    Elevated Lactic Acid   Penicillins Hives, Rash and Other (See Comments)    Has patient had a PCN reaction causing immediate rash, facial/tongue/throat swelling, SOB or lightheadedness with hypotension: No Has patient had a PCN reaction causing severe rash involving mucus membranes or skin necrosis: No Has patient had a PCN reaction that required hospitalization: No Has patient had a PCN reaction occurring within the last 10 years: No If all of the above answers are NO, then may proceed with Cephalosporin use. THE PATIENT IS ABLE TO TOLERATE CEPHALOSPORINS WITHOUT DIFFIC   Sulfa Antibiotics Hives and Rash   Other Other (See Comments)    ALL NUTS-SCRATCHY THROAT   Shellfish Allergy  Other (See  Comments)    ALL SEAFOOD-SCRATCHY THROAT   Trazodone  And Nefazodone Other (See Comments)    Causes me to hear voices   "

## 2024-08-17 ENCOUNTER — Telehealth (HOSPITAL_COMMUNITY): Payer: MEDICAID | Admitting: Family

## 2024-08-24 ENCOUNTER — Ambulatory Visit (HOSPITAL_BASED_OUTPATIENT_CLINIC_OR_DEPARTMENT_OTHER): Admitting: Student

## 2024-08-24 ENCOUNTER — Other Ambulatory Visit (HOSPITAL_COMMUNITY): Payer: Self-pay | Admitting: Family

## 2024-08-24 ENCOUNTER — Telehealth (HOSPITAL_COMMUNITY): Admitting: Family

## 2024-08-24 DIAGNOSIS — F331 Major depressive disorder, recurrent, moderate: Secondary | ICD-10-CM

## 2024-08-24 DIAGNOSIS — R41840 Attention and concentration deficit: Secondary | ICD-10-CM | POA: Diagnosis not present

## 2024-08-24 DIAGNOSIS — F3181 Bipolar II disorder: Secondary | ICD-10-CM

## 2024-08-24 MED ORDER — SERTRALINE HCL 100 MG PO TABS: ORAL_TABLET | ORAL | 0 refills | Status: AC

## 2024-08-24 MED ORDER — GABAPENTIN 100 MG PO CAPS: 100.0000 mg | ORAL_CAPSULE | Freq: Two times a day (BID) | ORAL | 1 refills | Status: AC

## 2024-08-24 MED ORDER — ATOMOXETINE HCL 40 MG PO CAPS: 40.0000 mg | ORAL_CAPSULE | Freq: Every day | ORAL | 0 refills | Status: AC

## 2024-08-24 MED ORDER — ARIPIPRAZOLE 5 MG PO TABS: 5.0000 mg | ORAL_TABLET | Freq: Every day | ORAL | 1 refills | Status: AC
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
## 2024-09-06 ENCOUNTER — Other Ambulatory Visit: Payer: Self-pay | Admitting: Primary Care

## 2024-09-06 DIAGNOSIS — E1165 Type 2 diabetes mellitus with hyperglycemia: Secondary | ICD-10-CM

## 2024-09-07 ENCOUNTER — Other Ambulatory Visit: Payer: Self-pay | Admitting: Primary Care

## 2024-09-07 DIAGNOSIS — E1165 Type 2 diabetes mellitus with hyperglycemia: Secondary | ICD-10-CM

## 2024-10-29 ENCOUNTER — Encounter: Admitting: Primary Care

## 2024-12-21 ENCOUNTER — Telehealth (HOSPITAL_COMMUNITY): Admitting: Family
